# Patient Record
Sex: Female | Born: 1940
Health system: Southern US, Community
[De-identification: ages and names within clinical notes are randomized; demographics above are authoritative.]

## PROBLEM LIST (undated history)

## (undated) DIAGNOSIS — K589 Irritable bowel syndrome without diarrhea: Secondary | ICD-10-CM

## (undated) DIAGNOSIS — I701 Atherosclerosis of renal artery: Secondary | ICD-10-CM

## (undated) DIAGNOSIS — I774 Celiac artery compression syndrome: Secondary | ICD-10-CM

## (undated) DIAGNOSIS — K551 Chronic vascular disorders of intestine: Secondary | ICD-10-CM

## (undated) DIAGNOSIS — M1990 Unspecified osteoarthritis, unspecified site: Secondary | ICD-10-CM

## (undated) DIAGNOSIS — K759 Inflammatory liver disease, unspecified: Secondary | ICD-10-CM

## (undated) DIAGNOSIS — E119 Type 2 diabetes mellitus without complications: Secondary | ICD-10-CM

## (undated) DIAGNOSIS — Z923 Personal history of irradiation: Secondary | ICD-10-CM

## (undated) DIAGNOSIS — Q453 Other congenital malformations of pancreas and pancreatic duct: Secondary | ICD-10-CM

## (undated) DIAGNOSIS — K219 Gastro-esophageal reflux disease without esophagitis: Secondary | ICD-10-CM

## (undated) DIAGNOSIS — Z8711 Personal history of peptic ulcer disease: Secondary | ICD-10-CM

## (undated) DIAGNOSIS — I2510 Atherosclerotic heart disease of native coronary artery without angina pectoris: Secondary | ICD-10-CM

## (undated) DIAGNOSIS — I441 Atrioventricular block, second degree: Secondary | ICD-10-CM

## (undated) DIAGNOSIS — K5900 Constipation, unspecified: Secondary | ICD-10-CM

## (undated) DIAGNOSIS — I4719 Other supraventricular tachycardia: Secondary | ICD-10-CM

## (undated) DIAGNOSIS — Z87442 Personal history of urinary calculi: Secondary | ICD-10-CM

## (undated) DIAGNOSIS — K222 Esophageal obstruction: Secondary | ICD-10-CM

## (undated) DIAGNOSIS — K5732 Diverticulitis of large intestine without perforation or abscess without bleeding: Secondary | ICD-10-CM

## (undated) DIAGNOSIS — R160 Hepatomegaly, not elsewhere classified: Secondary | ICD-10-CM

## (undated) DIAGNOSIS — K449 Diaphragmatic hernia without obstruction or gangrene: Secondary | ICD-10-CM

## (undated) DIAGNOSIS — E785 Hyperlipidemia, unspecified: Secondary | ICD-10-CM

## (undated) DIAGNOSIS — I5030 Unspecified diastolic (congestive) heart failure: Secondary | ICD-10-CM

## (undated) DIAGNOSIS — I471 Supraventricular tachycardia: Secondary | ICD-10-CM

## (undated) DIAGNOSIS — R600 Localized edema: Secondary | ICD-10-CM

## (undated) DIAGNOSIS — T8859XA Other complications of anesthesia, initial encounter: Secondary | ICD-10-CM

## (undated) DIAGNOSIS — E041 Nontoxic single thyroid nodule: Secondary | ICD-10-CM

## (undated) DIAGNOSIS — K5790 Diverticulosis of intestine, part unspecified, without perforation or abscess without bleeding: Secondary | ICD-10-CM

## (undated) DIAGNOSIS — Z9861 Coronary angioplasty status: Secondary | ICD-10-CM

## (undated) DIAGNOSIS — D3500 Benign neoplasm of unspecified adrenal gland: Secondary | ICD-10-CM

## (undated) DIAGNOSIS — M549 Dorsalgia, unspecified: Secondary | ICD-10-CM

## (undated) DIAGNOSIS — K2990 Gastroduodenitis, unspecified, without bleeding: Secondary | ICD-10-CM

## (undated) DIAGNOSIS — T4145XA Adverse effect of unspecified anesthetic, initial encounter: Secondary | ICD-10-CM

## (undated) DIAGNOSIS — Z9289 Personal history of other medical treatment: Secondary | ICD-10-CM

## (undated) DIAGNOSIS — R001 Bradycardia, unspecified: Secondary | ICD-10-CM

## (undated) DIAGNOSIS — Z8719 Personal history of other diseases of the digestive system: Secondary | ICD-10-CM

## (undated) DIAGNOSIS — I214 Non-ST elevation (NSTEMI) myocardial infarction: Secondary | ICD-10-CM

## (undated) DIAGNOSIS — I10 Essential (primary) hypertension: Secondary | ICD-10-CM

## (undated) DIAGNOSIS — K2970 Gastritis, unspecified, without bleeding: Secondary | ICD-10-CM

## (undated) DIAGNOSIS — I4891 Unspecified atrial fibrillation: Secondary | ICD-10-CM

## (undated) DIAGNOSIS — I771 Stricture of artery: Secondary | ICD-10-CM

## (undated) HISTORY — DX: Atherosclerosis of renal artery: I70.1

## (undated) HISTORY — DX: Stricture of artery: I77.1

## (undated) HISTORY — DX: Hyperlipidemia, unspecified: E78.5

## (undated) HISTORY — DX: Unspecified diastolic (congestive) heart failure: I50.30

## (undated) HISTORY — DX: Bradycardia, unspecified: R00.1

## (undated) HISTORY — DX: Gastritis, unspecified, without bleeding: K29.70

## (undated) HISTORY — PX: APPENDECTOMY: SHX54

## (undated) HISTORY — DX: Non-ST elevation (NSTEMI) myocardial infarction: I21.4

## (undated) HISTORY — DX: Essential (primary) hypertension: I10

## (undated) HISTORY — DX: Gastroduodenitis, unspecified, without bleeding: K29.90

## (undated) HISTORY — DX: Nontoxic single thyroid nodule: E04.1

## (undated) HISTORY — PX: BREAST SURGERY: SHX581

## (undated) HISTORY — PX: BREAST DUCTAL SYSTEM EXCISION: SHX5242

## (undated) HISTORY — DX: Other congenital malformations of pancreas and pancreatic duct: Q45.3

## (undated) HISTORY — DX: Coronary angioplasty status: Z98.61

## (undated) HISTORY — DX: Dorsalgia, unspecified: M54.9

## (undated) HISTORY — DX: Unspecified atrial fibrillation: I48.91

## (undated) HISTORY — PX: PANCREAS SURGERY: SHX731

## (undated) HISTORY — DX: Hepatomegaly, not elsewhere classified: R16.0

## (undated) HISTORY — PX: HAMMER TOE SURGERY: SHX385

## (undated) HISTORY — PX: BACK SURGERY: SHX140

## (undated) HISTORY — PX: TOTAL ABDOMINAL HYSTERECTOMY: SHX209

## (undated) HISTORY — DX: Localized edema: R60.0

## (undated) HISTORY — DX: Irritable bowel syndrome, unspecified: K58.9

## (undated) HISTORY — DX: Constipation, unspecified: K59.00

## (undated) HISTORY — PX: SPINE SURGERY: SHX786

## (undated) HISTORY — DX: Supraventricular tachycardia: I47.1

## (undated) HISTORY — DX: Benign neoplasm of unspecified adrenal gland: D35.00

## (undated) HISTORY — DX: Diverticulosis of intestine, part unspecified, without perforation or abscess without bleeding: K57.90

## (undated) HISTORY — DX: Celiac artery compression syndrome: I77.4

## (undated) HISTORY — DX: Atrioventricular block, second degree: I44.1

## (undated) HISTORY — PX: CATARACT EXTRACTION W/ INTRAOCULAR LENS  IMPLANT, BILATERAL: SHX1307

## (undated) HISTORY — DX: Diaphragmatic hernia without obstruction or gangrene: K44.9

## (undated) HISTORY — DX: Unspecified osteoarthritis, unspecified site: M19.90

## (undated) HISTORY — PX: KNEE SURGERY: SHX244

## (undated) HISTORY — PX: TOTAL KNEE ARTHROPLASTY: SHX125

## (undated) HISTORY — PX: HIP SURGERY: SHX245

## (undated) HISTORY — PX: ESOPHAGOGASTRODUODENOSCOPY (EGD) WITH ESOPHAGEAL DILATION: SHX5812

## (undated) HISTORY — PX: JOINT REPLACEMENT: SHX530

## (undated) HISTORY — DX: Diverticulitis of large intestine without perforation or abscess without bleeding: K57.32

## (undated) HISTORY — DX: Esophageal obstruction: K22.2

## (undated) HISTORY — DX: Other supraventricular tachycardia: I47.19

## (undated) HISTORY — DX: Gastro-esophageal reflux disease without esophagitis: K21.9

## (undated) HISTORY — PX: CARDIOVASCULAR STRESS TEST: SHX262

## (undated) HISTORY — DX: Atherosclerotic heart disease of native coronary artery without angina pectoris: I25.10

## (undated) HISTORY — DX: Chronic vascular disorders of intestine: K55.1

## (undated) HISTORY — PX: FRACTURE SURGERY: SHX138

## (undated) HISTORY — PX: CORONARY ANGIOPLASTY WITH STENT PLACEMENT: SHX49

## (undated) HISTORY — PX: CHOLECYSTECTOMY OPEN: SUR202

## (undated) HISTORY — PX: PERCUTANEOUS PINNING PHALANX FRACTURE OF HAND: SUR1027

---

## 2001-07-14 ENCOUNTER — Other Ambulatory Visit: Admission: RE | Admit: 2001-07-14 | Discharge: 2001-07-14 | Payer: Self-pay | Admitting: *Deleted

## 2001-09-09 ENCOUNTER — Encounter (INDEPENDENT_AMBULATORY_CARE_PROVIDER_SITE_OTHER): Payer: Self-pay | Admitting: Specialist

## 2001-09-09 ENCOUNTER — Ambulatory Visit (HOSPITAL_COMMUNITY): Admission: RE | Admit: 2001-09-09 | Discharge: 2001-09-09 | Payer: Self-pay | Admitting: Gastroenterology

## 2002-07-13 ENCOUNTER — Other Ambulatory Visit: Admission: RE | Admit: 2002-07-13 | Discharge: 2002-07-13 | Payer: Self-pay | Admitting: *Deleted

## 2003-08-30 ENCOUNTER — Other Ambulatory Visit: Admission: RE | Admit: 2003-08-30 | Discharge: 2003-08-30 | Payer: Self-pay | Admitting: *Deleted

## 2004-11-08 ENCOUNTER — Other Ambulatory Visit: Admission: RE | Admit: 2004-11-08 | Discharge: 2004-11-08 | Payer: Self-pay | Admitting: *Deleted

## 2005-11-19 ENCOUNTER — Ambulatory Visit: Payer: Self-pay | Admitting: Gastroenterology

## 2005-11-20 ENCOUNTER — Ambulatory Visit: Payer: Self-pay | Admitting: Gastroenterology

## 2005-11-20 ENCOUNTER — Encounter (INDEPENDENT_AMBULATORY_CARE_PROVIDER_SITE_OTHER): Payer: Self-pay | Admitting: Specialist

## 2005-11-20 ENCOUNTER — Encounter (INDEPENDENT_AMBULATORY_CARE_PROVIDER_SITE_OTHER): Payer: Self-pay | Admitting: Family Medicine

## 2005-11-20 DIAGNOSIS — K2970 Gastritis, unspecified, without bleeding: Secondary | ICD-10-CM | POA: Insufficient documentation

## 2005-11-20 DIAGNOSIS — K2990 Gastroduodenitis, unspecified, without bleeding: Secondary | ICD-10-CM

## 2005-11-20 DIAGNOSIS — K449 Diaphragmatic hernia without obstruction or gangrene: Secondary | ICD-10-CM | POA: Insufficient documentation

## 2005-11-20 DIAGNOSIS — K5732 Diverticulitis of large intestine without perforation or abscess without bleeding: Secondary | ICD-10-CM | POA: Insufficient documentation

## 2005-11-20 DIAGNOSIS — K222 Esophageal obstruction: Secondary | ICD-10-CM | POA: Insufficient documentation

## 2005-12-10 ENCOUNTER — Other Ambulatory Visit: Admission: RE | Admit: 2005-12-10 | Discharge: 2005-12-10 | Payer: Self-pay | Admitting: *Deleted

## 2005-12-24 ENCOUNTER — Ambulatory Visit: Payer: Self-pay | Admitting: Gastroenterology

## 2005-12-31 ENCOUNTER — Ambulatory Visit: Payer: Self-pay | Admitting: Gastroenterology

## 2006-01-06 ENCOUNTER — Ambulatory Visit: Payer: Self-pay | Admitting: Gastroenterology

## 2006-01-14 ENCOUNTER — Ambulatory Visit: Payer: Self-pay | Admitting: Gastroenterology

## 2006-01-28 ENCOUNTER — Ambulatory Visit: Payer: Self-pay | Admitting: Gastroenterology

## 2006-03-27 ENCOUNTER — Ambulatory Visit: Payer: Self-pay | Admitting: Gastroenterology

## 2006-04-24 ENCOUNTER — Ambulatory Visit: Payer: Self-pay | Admitting: Gastroenterology

## 2006-05-06 ENCOUNTER — Encounter: Admission: RE | Admit: 2006-05-06 | Discharge: 2006-05-06 | Payer: Self-pay | Admitting: Gastroenterology

## 2006-07-03 ENCOUNTER — Ambulatory Visit: Payer: Self-pay | Admitting: Gastroenterology

## 2006-08-19 ENCOUNTER — Ambulatory Visit: Payer: Self-pay | Admitting: Gastroenterology

## 2006-08-21 ENCOUNTER — Encounter: Payer: Self-pay | Admitting: Gastroenterology

## 2006-10-20 ENCOUNTER — Ambulatory Visit: Payer: Self-pay | Admitting: Gastroenterology

## 2006-10-20 LAB — CONVERTED CEMR LAB
Basophils Absolute: 0.1 10*3/uL (ref 0.0–0.1)
MCHC: 35 g/dL (ref 30.0–36.0)
Monocytes Relative: 10.9 % (ref 3.0–11.0)
Platelets: 238 10*3/uL (ref 150–400)
RBC: 4.6 M/uL (ref 3.87–5.11)
RDW: 12.6 % (ref 11.5–14.6)
Sed Rate: 37 mm/hr — ABNORMAL HIGH (ref 0–25)

## 2006-12-04 ENCOUNTER — Ambulatory Visit: Payer: Self-pay | Admitting: Gastroenterology

## 2006-12-05 ENCOUNTER — Ambulatory Visit: Payer: Self-pay | Admitting: Gastroenterology

## 2007-01-13 ENCOUNTER — Other Ambulatory Visit: Admission: RE | Admit: 2007-01-13 | Discharge: 2007-01-13 | Payer: Self-pay | Admitting: *Deleted

## 2007-05-13 ENCOUNTER — Encounter: Admission: RE | Admit: 2007-05-13 | Discharge: 2007-05-13 | Payer: Self-pay | Admitting: General Surgery

## 2007-06-09 ENCOUNTER — Ambulatory Visit: Payer: Self-pay | Admitting: Gastroenterology

## 2007-06-09 LAB — CONVERTED CEMR LAB
ALT: 28 units/L (ref 0–35)
AST: 21 units/L (ref 0–37)
Alkaline Phosphatase: 84 units/L (ref 39–117)
BUN: 7 mg/dL (ref 6–23)
Basophils Relative: 0.9 % (ref 0.0–1.0)
Bilirubin, Direct: 0.1 mg/dL (ref 0.0–0.3)
CO2: 32 meq/L (ref 19–32)
Calcium: 9 mg/dL (ref 8.4–10.5)
Chloride: 102 meq/L (ref 96–112)
Ferritin: 55.1 ng/mL (ref 10.0–291.0)
GFR calc non Af Amer: 89 mL/min
Glucose, Bld: 116 mg/dL — ABNORMAL HIGH (ref 70–99)
HCT: 37.5 % (ref 36.0–46.0)
Hemoglobin: 13 g/dL (ref 12.0–15.0)
Monocytes Absolute: 0.6 10*3/uL (ref 0.2–0.7)
Neutrophils Relative %: 63.6 % (ref 43.0–77.0)
RBC: 4.46 M/uL (ref 3.87–5.11)
RDW: 12.7 % (ref 11.5–14.6)
Sed Rate: 33 mm/hr — ABNORMAL HIGH (ref 0–25)
TSH: 3 microintl units/mL (ref 0.35–5.50)
Vitamin B-12: 424 pg/mL (ref 211–911)

## 2007-06-12 ENCOUNTER — Encounter: Payer: Self-pay | Admitting: Gastroenterology

## 2007-06-16 ENCOUNTER — Encounter: Payer: Self-pay | Admitting: Gastroenterology

## 2007-06-18 ENCOUNTER — Encounter: Admission: RE | Admit: 2007-06-18 | Discharge: 2007-06-18 | Payer: Self-pay | Admitting: General Surgery

## 2007-06-18 ENCOUNTER — Encounter: Payer: Self-pay | Admitting: Gastroenterology

## 2007-06-22 ENCOUNTER — Ambulatory Visit (HOSPITAL_BASED_OUTPATIENT_CLINIC_OR_DEPARTMENT_OTHER): Admission: RE | Admit: 2007-06-22 | Discharge: 2007-06-22 | Payer: Self-pay | Admitting: General Surgery

## 2007-06-22 ENCOUNTER — Encounter: Admission: RE | Admit: 2007-06-22 | Discharge: 2007-06-22 | Payer: Self-pay | Admitting: General Surgery

## 2007-06-22 ENCOUNTER — Encounter (INDEPENDENT_AMBULATORY_CARE_PROVIDER_SITE_OTHER): Payer: Self-pay | Admitting: General Surgery

## 2007-09-01 ENCOUNTER — Ambulatory Visit: Payer: Self-pay | Admitting: Gastroenterology

## 2007-09-01 LAB — CONVERTED CEMR LAB: Hgb A1c MFr Bld: 6 % (ref 4.6–6.0)

## 2007-11-12 DIAGNOSIS — I499 Cardiac arrhythmia, unspecified: Secondary | ICD-10-CM | POA: Insufficient documentation

## 2007-11-12 DIAGNOSIS — N200 Calculus of kidney: Secondary | ICD-10-CM | POA: Insufficient documentation

## 2007-11-12 DIAGNOSIS — E039 Hypothyroidism, unspecified: Secondary | ICD-10-CM | POA: Insufficient documentation

## 2007-11-12 DIAGNOSIS — Q453 Other congenital malformations of pancreas and pancreatic duct: Secondary | ICD-10-CM | POA: Insufficient documentation

## 2007-11-12 DIAGNOSIS — K589 Irritable bowel syndrome without diarrhea: Secondary | ICD-10-CM | POA: Insufficient documentation

## 2007-11-24 ENCOUNTER — Ambulatory Visit: Payer: Self-pay | Admitting: Gastroenterology

## 2007-11-24 LAB — CONVERTED CEMR LAB: BUN: 11 mg/dL (ref 6–23)

## 2007-12-03 ENCOUNTER — Ambulatory Visit (HOSPITAL_COMMUNITY): Admission: RE | Admit: 2007-12-03 | Discharge: 2007-12-03 | Payer: Self-pay | Admitting: Gastroenterology

## 2007-12-21 ENCOUNTER — Ambulatory Visit: Payer: Self-pay

## 2008-02-09 ENCOUNTER — Telehealth: Payer: Self-pay | Admitting: Gastroenterology

## 2008-02-12 ENCOUNTER — Encounter: Payer: Self-pay | Admitting: Gastroenterology

## 2008-03-15 ENCOUNTER — Inpatient Hospital Stay (HOSPITAL_COMMUNITY): Admission: RE | Admit: 2008-03-15 | Discharge: 2008-03-20 | Payer: Self-pay | Admitting: Orthopedic Surgery

## 2008-06-30 ENCOUNTER — Ambulatory Visit (HOSPITAL_BASED_OUTPATIENT_CLINIC_OR_DEPARTMENT_OTHER): Admission: RE | Admit: 2008-06-30 | Discharge: 2008-06-30 | Payer: Self-pay | Admitting: Orthopedic Surgery

## 2009-01-03 ENCOUNTER — Ambulatory Visit: Payer: Self-pay | Admitting: Gastroenterology

## 2009-01-24 ENCOUNTER — Ambulatory Visit: Payer: Self-pay | Admitting: Gastroenterology

## 2009-01-24 DIAGNOSIS — R197 Diarrhea, unspecified: Secondary | ICD-10-CM

## 2009-01-24 DIAGNOSIS — K219 Gastro-esophageal reflux disease without esophagitis: Secondary | ICD-10-CM | POA: Insufficient documentation

## 2009-01-24 DIAGNOSIS — R1319 Other dysphagia: Secondary | ICD-10-CM | POA: Insufficient documentation

## 2009-01-24 DIAGNOSIS — K909 Intestinal malabsorption, unspecified: Secondary | ICD-10-CM | POA: Insufficient documentation

## 2009-01-24 LAB — CONVERTED CEMR LAB
Albumin: 3.4 g/dL — ABNORMAL LOW (ref 3.5–5.2)
Alkaline Phosphatase: 94 units/L (ref 39–117)
BUN: 8 mg/dL (ref 6–23)
Basophils Relative: 0.1 % (ref 0.0–3.0)
CO2: 32 meq/L (ref 19–32)
Calcium: 9 mg/dL (ref 8.4–10.5)
Creatinine, Ser: 0.7 mg/dL (ref 0.4–1.2)
Eosinophils Relative: 1.7 % (ref 0.0–5.0)
Folate: 17.6 ng/mL
Glucose, Bld: 99 mg/dL (ref 70–99)
Lymphs Abs: 1.6 10*3/uL (ref 0.7–4.0)
MCHC: 34.6 g/dL (ref 30.0–36.0)
MCV: 84.1 fL (ref 78.0–100.0)
Monocytes Relative: 9.1 % (ref 3.0–12.0)
Neutro Abs: 4.9 10*3/uL (ref 1.4–7.7)
RBC: 4.42 M/uL (ref 3.87–5.11)
RDW: 12.9 % (ref 11.5–14.6)
Saturation Ratios: 9.2 % — ABNORMAL LOW (ref 20.0–50.0)
Total Protein: 7.1 g/dL (ref 6.0–8.3)
Vitamin B-12: 441 pg/mL (ref 211–911)

## 2009-01-27 ENCOUNTER — Encounter: Payer: Self-pay | Admitting: Gastroenterology

## 2009-02-03 LAB — CONVERTED CEMR LAB
Epinephrine 24 Hr Urine: 6 mcg/24hr (ref <20)
Norepinephrine 24 Hr Urine: 84 mcg/24hr — ABNORMAL HIGH (ref <80)
Normetanephrine, 24H Ur: 584 (ref 122–676)

## 2009-03-01 ENCOUNTER — Encounter: Payer: Self-pay | Admitting: Gastroenterology

## 2009-03-01 ENCOUNTER — Ambulatory Visit: Payer: Self-pay | Admitting: Gastroenterology

## 2009-03-01 HISTORY — PX: COLONOSCOPY: SHX174

## 2009-03-03 ENCOUNTER — Encounter: Payer: Self-pay | Admitting: Gastroenterology

## 2009-09-29 ENCOUNTER — Telehealth: Payer: Self-pay | Admitting: Gastroenterology

## 2009-10-03 ENCOUNTER — Ambulatory Visit: Payer: Self-pay | Admitting: Gastroenterology

## 2010-04-25 ENCOUNTER — Encounter: Admission: RE | Admit: 2010-04-25 | Discharge: 2010-04-25 | Payer: Self-pay | Admitting: General Surgery

## 2010-07-23 ENCOUNTER — Encounter: Admission: RE | Admit: 2010-07-23 | Discharge: 2010-07-23 | Payer: Self-pay | Admitting: Cardiology

## 2010-07-30 ENCOUNTER — Inpatient Hospital Stay (HOSPITAL_COMMUNITY): Admission: RE | Admit: 2010-07-30 | Discharge: 2010-07-31 | Payer: Self-pay | Admitting: Cardiology

## 2010-07-30 DIAGNOSIS — I2510 Atherosclerotic heart disease of native coronary artery without angina pectoris: Secondary | ICD-10-CM | POA: Insufficient documentation

## 2010-07-30 HISTORY — PX: CORONARY ANGIOPLASTY WITH STENT PLACEMENT: SHX49

## 2010-07-30 HISTORY — PX: LEFT HEART CATH AND CORONARY ANGIOGRAPHY: CATH118249

## 2010-08-14 DIAGNOSIS — I701 Atherosclerosis of renal artery: Secondary | ICD-10-CM

## 2010-08-14 HISTORY — DX: Atherosclerosis of renal artery: I70.1

## 2010-09-10 ENCOUNTER — Telehealth: Payer: Self-pay | Admitting: Gastroenterology

## 2010-10-04 NOTE — Progress Notes (Signed)
Summary: Triage   Phone Note Call from Patient Call back at Home Phone [phone]   Caller: Patient Call For: Dr. Jarold Motto Reason for Call: Talk to Nurse Summary of Call: Insurance will not pay for Ultrase and needs something else called in  Initial call taken by: Karna Christmas,  September 29, 2009 12:31 PM  Follow-up for Phone Call        whatever is on her list is ok Follow-up by: Mardella Layman MD [name],  September 29, 2009 1:39 PM  Additional Follow-up for Phone Call Additional follow up Details #1::        talked with pt.  She has new formulary.  Pt req OV, having some abd pain.  Appt sch.  PT to bring formulary with her. Additional Follow-up by: Ashok Cordia RN,  September 29, 2009 2:04 PM

## 2010-10-04 NOTE — Assessment & Plan Note (Signed)
Summary: Abd pain, needs med change/dfs    History of Present Illness Visit Type: Follow-up Visit Primary GI MD: Sheryn Bison MD FACP FAGA Primary Provider: Dr Luiz Iron Chief Complaint: Patient c/o 1 month intermittent RUQ abdominal pain which is sharp. She denies any nausea or vomiting. No fever. She denies any loss of appetite. She has had diarrhea but states that this is normal for her. History of Present Illness:   Leighla continues with vague right upper quadrant discomfort, gas and bloating. I reviewed all of her records today personally and with her including CT scan this past sprihg. She has been fatty infiltration the liver and suspected chronic pancreatitis from pancreas divisum and has had previous pancreatic stenting but recent CT scan showed no evidence of a retained stent. She denies any specific hepatobiliary complaints at this time. She has chronic irritable bowel syndrome with chronic gas and bloating and bowel irregularity. Recently her Ultrase pancreatic enzyme supplements were removed from the market place. She personally feels these medications are greatly alleviated her chronic gastrointestinal difficulties.  She continues to eat well and has chronic obesity. Liver function tests have been repeatedly normal. She denies acid reflux or dysphagia . She has a family history colon cancer, but is up-to-date on her colonoscopy.    GI Review of Systems    Reports abdominal pain and  bloating.     Location of  Abdominal pain: RUQ.    Denies acid reflux, belching, chest pain, dysphagia with liquids, dysphagia with solids, heartburn, loss of appetite, nausea, vomiting, vomiting blood, weight loss, and  weight gain.      Reports diarrhea and  rectal bleeding.     Denies anal fissure, black tarry stools, change in bowel habit, constipation, diverticulosis, fecal incontinence, heme positive stool, hemorrhoids, irritable bowel syndrome, jaundice, light color stool, liver problems, and   rectal pain.    Current Medications (verified): 1)  Monoject Safety Syringe/shield 22g X 1" 3 Ml Misc (Syringe/needle (Disp)) .... Use 1 Syringe As Directed 2)  Furosemide 20 Mg  Tabs (Furosemide) .... Take One Tab  By Mouth Once Daily 3)  Atenolol 100 Mg Tabs (Atenolol) .... One Tablet By Mouth Once Daily 4)  Ultrase Mt 20 65-20-65 Mu  Cpep (Amylase-Lipase-Protease) .... Take 1 With Meals Three Times A Day 5)  Nexium 40 Mg  Cpdr (Esomeprazole Magnesium) .Marland Kitchen.. 1 Capsule Each Day 30 Minutes Before Meal 6)  Cyanocobalamin 1000 Mcg/ml Soln (Cyanocobalamin) .... Inject Im Once Per Month 7)  Furosemide 40 Mg Tabs (Furosemide) .... One Tablet By Mouth Once Daily 8)  Vitamin D 1000 Unit Tabs (Cholecalciferol) .... Take 1 Tablet By Mouth Once Daily  Allergies (verified): 1)  Codeine Phosphate (Codeine Phosphate) 2)  Iodine (Iodine)  Past History:  Past medical, surgical, family and social histories (including risk factors) reviewed for relevance to current acute and chronic problems.  Past Medical History: Reviewed history from 11/12/2007 and no changes required. Current Problems:  PANCREAS DIVISUM (ICD-751.7) IRRITABLE BOWEL SYNDROME (ICD-564.1) RENAL CALCULUS (ICD-592.0) CARDIAC ARRHYTHMIA (ICD-427.9) THYROID DISORDER (ICD-246.9) GASTRITIS (ICD-535.50) HIATAL HERNIA (ICD-553.3) ESOPHAGEAL STRICTURE (ICD-530.3) DIVERTICULITIS, COLON (ICD-562.11)  Past Surgical History: Cholecystectomy minor papilla stent - Dr. Woodroe Chen knee surgery-left foot surgery-right hip surgery-left breast duct surgery-right hysterectomy Hand surgery-left  Family History: Reviewed history from 01/24/2009 and no changes required. cancer-metastatic-father MI-father/brothers x 2 CVA-mother No FH of Colon Cancer: Family History of Colitis/Crohn's: Grandfather (colitis) Family History of Diabetes: Brother  Social History: Reviewed history from 01/24/2009 and no changes required.  Occupation: owner  gift shop Patient has never smoked.  Alcohol Use - no Daily Caffeine Use-3-4 glasses a day Illicit Drug Use - no Patient does not get regular exercise.   Review of Systems       The patient complains of arthritis/joint pain, back pain, fatigue, muscle pains/cramps, and shortness of breath.    Vital Signs:  Patient profile:   70 year old female Height:      68 inches Weight:      244 pounds BMI:     37.23 BSA:     2.23 Pulse rate:   80 / minute Pulse rhythm:   regular BP sitting:   134 / 68  (right arm)  Vitals Entered By: Hortense Ramal CMA Duncan Dull) (October 03, 2009 2:15 PM)  Physical Exam  General:  Well developed, well nourished, no acute distress.healthy appearing and obese.   Head:  Normocephalic and atraumatic. Eyes:  PERRLA, no icterus.exam deferred to patient's ophthalmologist.   Lungs:  Clear throughout to auscultation. Heart:  Regular rate and rhythm; no murmurs, rubs,  or bruits. Abdomen:  Soft, nontender and nondistended. No masses, hepatosplenomegaly or hernias noted. Normal bowel sounds. Msk:  Symmetrical with no gross deformities. Normal posture. Pulses:  Normal pulses noted. Extremities:  No clubbing, cyanosis, edema or deformities noted. Neurologic:  Alert and  oriented x4;  grossly normal neurologically. Psych:  Alert and cooperative. Normal mood and affect.   Impression & Recommendations:  Problem # 1:  GERD (ICD-530.81) Assessment Improved Continue reflux regime and daily Nexium therapy.  Problem # 2:  PANCREAS DIVISUM (ICD-751.7) Assessment: Improved change to Zen-pap pancreatic extract supplementation.  Problem # 3:  IRRITABLE BOWEL SYNDROME (ICD-564.1) Assessment: Improved Periodic treatment for suspected bacterial overgrowth syndrome as needed  Patient Instructions: 1)  Begin Zenpap 20 three times a day.  A prescription will be sent to your pharmacy.  Please reoprt to [place] how this works for you. 2)  The medication list was reviewed and  reconciled.  All changed / newly prescribed medications were explained.  A complete medication list was provided to the patient / caregiver. 3)  Please continue current medications.  4)  Please schedule a follow-up appointment as needed.  Prescriptions: ZENPEP 20000 UNIT CPEP (PANCRELIPASE (LIP-PROT-AMYL)) 1 by mouth tid  #90 x 6   Entered by:   Ashok Cordia RN   Authorized by:   Mardella Layman MD [facility]   Signed by:   Ashok Cordia RN on 10/03/2009   Method used:   Electronically to        [facility]  [address] [phone][facility] (retail)       [address]       [place], [place]  [address]       Ph: [phone] or [phone]       Fax: [phone]   RxID:   [id number]

## 2010-10-04 NOTE — Progress Notes (Signed)
Summary: Medication Questions  Medications Added NEXIUM 40 MG  CPDR (ESOMEPRAZOLE MAGNESIUM) 1 capsule each day 30 minutes before meal       Phone Note Call from Patient Call back at Home Phone [phone]   Caller: Patient Call For: Dr. Jarold Motto Reason for Call: Talk to Nurse Summary of Call: pt wants to discuss her nexium with a nurse Initial call taken by: [place] Johnson,  September 10, 2010 3:35 PM  Follow-up for Phone Call        Patient called to report she had 2 stents placed in December and she's been taking Prilosec d/t Medicare donut hole. Patient stated her husband had an MII and Dr Jarold Motto instructed him never to take Prilosec again-take Nexium; she stated Dr Jarold Motto gave him a paper explaining this. Patient stated she's been using OTC Gaviscon and other meds with no help, should she not be on Prilosec either? Graciella Freer RN  September 10, 2010 4:14 PM   Additional Follow-up for Phone Call Additional follow up Details #1::        SHE IS RIGHT...USE NEXIUM,NOT PRILOSEC IF ON PLAVIX... Additional Follow-up by: Mardella Layman MD Elba Barman 4:47 PM    Additional Follow-up for Phone Call Additional follow up Details #2::    Notified patient that Dr Jarold Motto stated if a patient is on Plavix, they cannot take Prilosec; he ok'd an order for Nexium. Patient stated understanding and med was ordered. Follow-up by: Graciella Freer RN,  September 10, 2010 4:56 PM  New/Updated Medications: NEXIUM 40 MG  CPDR (ESOMEPRAZOLE MAGNESIUM) 1 capsule each day 30 minutes before meal Prescriptions: NEXIUM 40 MG  CPDR (ESOMEPRAZOLE MAGNESIUM) 1 capsule each day 30 minutes before meal  #30 x 3   Entered by:   Graciella Freer RN   Authorized by:   Mardella Layman MD [facility]   Signed by:   Graciella Freer RN on 09/10/2010   Method used:   [facility]        [facility]  [address] [phone]* (retail)       [address]       [place], [place]   [address]       Ph: [phone] or [phone]       Fax: [phone]   RxID:   [id number]

## 2010-10-24 ENCOUNTER — Other Ambulatory Visit (HOSPITAL_COMMUNITY): Payer: Self-pay | Admitting: Cardiology

## 2010-10-26 ENCOUNTER — Ambulatory Visit (HOSPITAL_COMMUNITY)
Admission: RE | Admit: 2010-10-26 | Discharge: 2010-10-26 | Disposition: A | Discharge status: Home / Self Care | Payer: Medicare Other | Source: Ambulatory Visit | Attending: Cardiology | Admitting: Cardiology

## 2010-10-26 DIAGNOSIS — R109 Unspecified abdominal pain: Secondary | ICD-10-CM | POA: Insufficient documentation

## 2010-10-26 DIAGNOSIS — K7689 Other specified diseases of liver: Secondary | ICD-10-CM | POA: Insufficient documentation

## 2010-11-13 LAB — CBC
Hemoglobin: 11.5 g/dL — ABNORMAL LOW (ref 12.0–15.0)
MCH: 27.6 pg (ref 26.0–34.0)
MCV: 84.9 fL (ref 78.0–100.0)
RBC: 4.16 MIL/uL (ref 3.87–5.11)

## 2010-11-13 LAB — BASIC METABOLIC PANEL
CO2: 24 mEq/L (ref 19–32)
Chloride: 102 mEq/L (ref 96–112)
GFR calc Af Amer: >60 mL/min (ref >60)
Potassium: 4.5 mEq/L (ref 3.5–5.1)
Sodium: 136 mEq/L (ref 135–145)

## 2010-11-13 LAB — POCT I-STAT 3, ART BLOOD GAS (G3+)
Acid-Base Excess: 2 mmol/L (ref 0.0–2.0)
Bicarbonate: 28 mEq/L — ABNORMAL HIGH (ref 20.0–24.0)
O2 Saturation: 91 %
TCO2: 29 mmol/L (ref 0–100)
pO2, Arterial: 66 mmHg — ABNORMAL LOW (ref 80.0–100.0)

## 2010-11-13 LAB — POCT I-STAT 3, VENOUS BLOOD GAS (G3P V)
Bicarbonate: 28.7 mEq/L — ABNORMAL HIGH (ref 20.0–24.0)
TCO2: 30 mmol/L (ref 0–100)
pH, Ven: 7.335 — ABNORMAL HIGH (ref 7.250–7.300)

## 2010-11-13 LAB — GLUCOSE, CAPILLARY: Glucose-Capillary: 168 mg/dL — ABNORMAL HIGH (ref 70–99)

## 2010-11-27 ENCOUNTER — Other Ambulatory Visit: Payer: Self-pay | Admitting: Gastroenterology

## 2011-01-15 NOTE — Assessment & Plan Note (Signed)
[facility]                         GASTROENTEROLOGY OFFICE NOTE   YAZLYNN, BIRKELAND                       MRN:          [id number]  DATE:09/01/2007                            DOB:          1941-06-15    Haelee has had breast surgery since she was last seen and really has not  tried her Lotronex at a lower dose.  She continues with diarrhea and  predominant IBS symptoms.  It is of note that her IBS serology pattern  done at  [facility] in October of 2008 was felt to be not consistent  with IBS.  C19-9 pancreatic tumor marker was normal, as was CBC and  metabolic profile.  She did have a slightly elevated sedimentation rate  of 33, but otherwise her blood work was fairly normal.  Her albumin was  slightly depressed at 3.3 gm%.   Review of Kymberlee's chart shows that she had endoscopy in March of 2007,  and last colonoscopy was in January of 2003.  At that time, she had  multiple colon biopsies without evidence of microscopic-collagenous  colitis.  Ultrasound of the upper abdomen was performed in April of  2008.  It was unremarkable except for previous cholecystectomy.  The  patient relates that she had a previous minor papilla stent placed at  [facility] by Dr. Oren Binet.  There has been no evidence of  stent retention or other complications from such.  She does have chronic  pancreatic insufficiency and known pancreas divisum and takes Ultrase MT  20 one t.i.d.   OTHER MEDICATIONS:  1. Nexium 40 mg daily.  2. Triamterene/HCTZ 50/25 mg daily for edema.  3. Lasix 20 mg three times a week.  4. Atenolol 50 mg a day.  5. B-12 replacement shots.  6. Ultrase.   PHYSICAL EXAMINATION:  GENERAL:  She is a healthy-appearing white female  in no distress, appearing her stated age.  VITAL SIGNS:  I do not have a weight in her chart today.  Blood pressure  122/72, pulse 80 and regular.  (General physical exam was not repeated at this time.)   ASSESSMENT:  I still think that Thirza's main problem is diarrhea  predominant irritable bowel syndrome and rapid intestinal transit.  All  attempts in the past at treatment of her chronic diarrhea have been  unsuccessful.  She had a previous very good response previously to  Lotronex years ago on reviewing her chart.  Previous small bowel biopsy  has not shown any evidence of celiac disease.   RECOMMENDATIONS:  I have asked her to try Lotronex 0.5 mg twice a day or  once a day as needed for her diarrhea-like syndrome.  She will continue  her empiric Ultrase MT 20 tablets t.i.d.  Will see her back in 3 months time on a p.r.n. basis as needed.  Consideration needs to be given to repeat colonoscopy which has not been  done in several years.     Vania Rea. Jarold Motto, MD, Caleen Essex, FAGA  Electronically Signed    DRP/MedQ  DD: 09/01/2007  DT: 09/01/2007  Job #: [id number]

## 2011-01-15 NOTE — Op Note (Signed)
[name], [name]                ACCOUNT NO.:  [id number]   MEDICAL RECORD NO.:  [id number]          PATIENT TYPE:  AMB   LOCATION:  [facility]                         FACILITY:  [facility]   PHYSICIAN:  Deidre Ala, M.D.    DATE OF BIRTH:  [birth year]   DATE OF PROCEDURE:  06/30/2008  DATE OF DISCHARGE:                               OPERATIVE REPORT   PREOPERATIVE DIAGNOSIS:  1. Left hip greater trochanteric prominence with bursitis and tight      iliotibial band.  2. Right second hammertoe at proximal interphalangeal joint.  3. Right great toenail deformity - onychomycosis.   POSTOPERATIVE DIAGNOSIS:  1. Left hip greater trochanteric prominence with bursitis and tight      iliotibial band.  2. Right second hammertoe at proximal interphalangeal joint.  3. Right great toenail deformity - onychomycosis.   PROCEDURE:  1. Left greater trochanteric partial ostectomy with bursectomy and      iliotibial band release.  2. Correction of right second hammertoe at PIP joint DuVries      procedure.  3. Removal left great toenail plate only.   SURGEON:  Doristine Section, MD.   ASSISTANT:  Phineas Semen, PA-C.   ANESTHESIA:  General with LMA.   CULTURES:  None.   DRAINS:  None.   ESTIMATED BLOOD LOSS:  Minimal.   TOURNIQUET TIME:  Right ankle 26 minutes, Esmarch.   PATHOLOGIC FINDINGS/HISTORY:  [name] is a 70 year old female who  underwent recent successful left total knee arthroplasty.  Postoperatively, she has gotten hip area pain and she has been worked up  for any significant radiculopathy in the low back.  She had cortisone  injections in the greater trochanter that improved her situation.  She  has pain radiating from the trochanter down to the lateral leg.  The  knee itself is doing well except for some mild stiffness.  With the  diagnosis of trochanteric bursitis, she elected to proceed with  iliotibial band release, partial ostectomy.  There was edema on her  greater  trochanter suggesting some trochanteric bursitis.  There was  nothing going on in the hip joint itself.  In addition, she had right  great toe onychomycosis but did not desire total ablation of the matrix,  just wanted the plate removed, and the right second toe was hammered  chronically at the PIP joint so we corrected it with a DuVries  procedure.  Her lumbar MRI scan suggested some lower lumbar degenerative  disk disease but Dr. Regino Schultze did not feel she had significant radiculitis  and did not do cortisone at this point, especially because she has  severe reactions thereof.  She did get a Toradol and Marcaine injection  to left hip greater trochanter which helped but it did not last a long  time.  At surgery she had a very tight iliotibial band with a thick  greater trochanteric bursa.  This released nicely with a classic  cruciate technique.  There was evidence of enthesopathy with tendon  insertion as well as a craggy sharp greater trochanter, all of this was  smoothed and  released and left released with respect to the iliotibial  band.  We corrected the right second toe to physiologic parameters, not  completely straight but physiologic, at the PIP joint using the DuVries  technique, which will be described, and removed her right great toenail  plate only.   PROCEDURE:  With adequate anesthesia obtained using endotracheal  technique, 1 gram Ancef given IV prophylaxis.  The patient was placed in  the right lateral decubitus position with the left side up with a  beanbag.  After standard prepping and draping, a straight incision was  made about 5-6 cm over the greater trochanter.  Incision was deepened  sharply with a knife and hemostasis obtained using the Bovie  electrocoagulator.  Dissection was carried down to the iliotibial band.  This was released longitudinally about 4 cm and anterior posterior about  3 cm in a cruciate fashion directly over the bursa.  I then sharply  excised  the bursa with the Bovie.  I then removed the spur on the  lateral aspect to cortical bone to promote neovascularity on the greater  trochanter smoothing it.  Irrigation was carried out, that wound was  closed in layers with #2-0 and #3-0 Vicryl in the subcu and skin  staples.  Marcaine 0.5% was injected in and about the wound 30 mL.  We  then placed a dressing, turned the patient supine and prepped and draped  the right foot.  Esmarch exsanguination was used and the Esmarch was  left on above the ankle.  I then used a Glorious Peach to remove the great  toenail plate.  I then proceeded with a right second toe DuVries by  taking a wedge of skin dorsally over the PIP joint, a wedge of the  extensor tendon, then I took a wedge osteotomy removing the PIP joint  with oscillating saw and bone cutting rongeur at the PIP joint, removing  the articular surface.  I then released the flexor tendon with a Beaver  blade.  I then brought the toe up to a slightly curved physiologic  position and sutured it using greasy gauze Xeroflo bolsters 1/2-inch  with vertical mattress suture of #4-0 nylon and then #4-0 nylon sutures  on either side.  A bulky sterile compressive forefoot dressing was  applied with Adaptic on the great toe.  The tourniquet was let down.  The patient, having tolerated procedure well, was awakened and taken to  the recovery room in satisfactory condition.  He will be discharged per  outpatient routine given Percocet for pain, wooden sole shoe on the  right, crutches, weightbearing as tolerated, told to call the office for  recheck on Monday.      Deidre Ala, M.D.  Electronically Signed     VEP/MEDQ  D:  06/30/2008  T:  06/30/2008  Job:  [id number]   cc:   Deidre Ala, M.D.  Fax: [phone]   Dennis Bast, MD  [place], [place]

## 2011-01-15 NOTE — Op Note (Signed)
[name], [name]                ACCOUNT NO.:  [id number]   MEDICAL RECORD NO.:  [id number]          PATIENT TYPE:  INP   LOCATION:  0002                         FACILITY:  [facility]   PHYSICIAN:  Deidre Ala, M.D.    DATE OF BIRTH:  02-Oct-1940   DATE OF PROCEDURE:  03/15/2008  DATE OF DISCHARGE:                               OPERATIVE REPORT   PREOPERATIVE DIAGNOSIS:  End-stage degenerative joint disease left knee,  with high body mass index.   POSTOPERATIVE DIAGNOSIS:  End-stage degenerative joint disease left  knee, with high body mass index.   PROCEDURE:  Left total knee arthroplasty using cemented DePuy components  LCS type, with rotating platform and DT stem.   SURGEON:  1. Charlesetta Shanks, M.D.   ASSISTANT:  Phineas Semen, P.A.-C.   ANESTHESIA:  General, with femoral nerve block.   CULTURES:  None.   DRAINS:  Two medium Hemovacs to self-suction.   ESTIMATED BLOOD LOSS:  50 mL.   REPLACEMENT:  Without.   TOURNIQUET TIME:  1 hour 11 minutes.   PATHOLOGIC FINDINGS AND HISTORY:  [name] has had a longstanding history  of degenerative joint disease reaching end stage left with slight varus.  She has high body mass index issues.  At surgery, classic tricompartment  disease was noted.  She was fitted to a standard left femoral component,  #3 MBT revision cemented tray, universal stem fluted 75 x 16, and a 10  mm rotating platform, and a 32 mm all-poly patellar button.  We used two  batches of tobramycin in two batches of cement, 1.3 g, and we had full  extension, flexion to about 100 degrees, stable to varus-valgus  stressing, good ligamentous stability, and we had to do a lateral  retinacular release because the patellofemoral articulation was tight  laterally.  This allowed physiologic tilt and track.  She also had  gentamicin as per protocol injected into the knee at closure, 80 mg in  40 mL injected into the drain, with rubber shod capping of the drain for  2 hours  until its charged in the PACU.   PROCEDURE:  With adequate anesthesia obtained using LMA technique and  femoral nerve block, the patient was placed in the supine position.  The  left lower extremity was prepped from the toes to the tourniquet in the  standard fashion.  After standard prepping and draping, Esmarch  exsanguination was used.  The tourniquet was let up to 400 mmHg.  A  median parapatellar skin incision was made, followed by a median  parapatellar retinacular incision.  The incision was deepened sharply  with a knife and hemostasis obtained using the Bovie electrocoagulater.  We then incised the patellar retinaculum, everted the patella, and  excised the fat pad, both menisci, and the cruciates.  I then amputated  the tibial spine flat and proceeded to drill the tibial intramedullary  canal in slight varus to effect leg valgus for the stem, and reamed up  to a 16.  I then cut with a 2-degree cutting block on 4 mm the tibial  cut and freshened  it.  I then sized the femur to a standard, placed it  in place, and set it with a C clamp.  It was still a bit tight for the  10, so I moved it up 2.5 mm, made the anterior and posterior cuts, and  fit the 10 lollipop in flexion.  I then placed a 4-degree valgus distal  femoral cutting jig in place, made that cut.  It was too tight for the  10. I made the first cut 2.5 mm back, and the second one another 2.5,  and then fit the 10 mm in full extension.  I then placed a finishing  guide on the distal femur with  anterior, posterior, chamfer cuts, and  notch cut.  I then exposed the tibia and sized to a 3, and then reamed  with the proximal conical ream, and then reamed with the stem for the  16.  I then placed a trial tibial component with the stem in place and  impacted it with the keel punch and set the rotation.  I then placed a  10 mm insert in, articulated, and set the femoral component in, and  articulated the knee through a range of  motion.  The lateral retinacular  release was then carried out, the patella everted, sized to a 32.  Caliper measurement was thickness of 21, cut it down to a 13-14,  replacing about 8, placed the 3 peg drill holes in place, and made those  drill holes and placed the patella button in place, and articulated the  knee through a range of motion.  The trial components were then removed.  The knee was thoroughly jet lavaged at this point.  We then sized the  components as there were coming on the field, and mixed cement with the  tobramycin in the cement gun.  We then cemented on the tibial component,  impacted it, and removed excess cement.  We then placed the 10 mm  rotating platform.  We cemented on the femoral component, impacted it,  removed excess cement, held the knee in full extension, and removed  excess cement.  We then cemented on the patellar component, impacted it,  removed excess cement, and held it with a clamp until the cement cured.  The knee was then again articulated through a range of motion.  FloSeal  was then placed in the wound as the tourniquet was let down.  Bleeding  points were cauterized.  Hemovac drains were placed in the medial and  lateral gutter and brought out the superolateral portal.  The wound was  then closed in layers with #1 Vicryl figure-of-eight on the retinaculum,  0 and 2-0 Vicryl on the subcutaneous, and skin staples.  We charged the  knee with the gentamicin mixture with the saline, and then clamped it.  We then placed a bulky sterile compressive dressing with a knee  immobilizer.  The patient, having tolerated the procedure well, was  awakened and taken to the recovery room in satisfactory condition, to be  admitted for routine postoperative care, CPM, and analgesia.           ______________________________  V. Charlesetta Shanks, M.D.     VEP/MEDQ  D:  03/15/2008  T:  03/15/2008  Job:  [id number]   cc:   Dennis Bast, MD  Fax: [phone]   [facility]

## 2011-01-15 NOTE — Op Note (Signed)
[name], [name]                ACCOUNT NO.:  [id number]   MEDICAL RECORD NO.:  [id number]          PATIENT TYPE:  AMB   LOCATION:  DSC                          FACILITY:  [facility]   PHYSICIAN:  Ollen Gross. Vernell Morgans, M.D. DATE OF BIRTH:  [birth year]   DATE OF PROCEDURE:  06/22/2007  DATE OF DISCHARGE:                               OPERATIVE REPORT   PREOPERATIVE DIAGNOSIS:  Right breast papilloma and calcifications.   POSTOPERATIVE DIAGNOSIS:  Right breast papilloma and calcifications.   PROCEDURE:  Right breast needle-localized lumpectomy.   SURGEON:  Ollen Gross. Vernell Morgans, M.D.   ANESTHESIA:  General via LMA.   PROCEDURE:  After informed consent was obtained, the patient was brought  to the operating, placed in supine position on the operating room table.  After adequate induction of general anesthesia, the patient's right  breast was prepped with Hibiclens and draped in usual sterile manner.  Earlier in the day, the patient had undergone a wire localization  procedure and the patient had two wires entering the right breast  laterally, marking a radially oriented duct system with calcifications.  A radial incision was made to encompass the entry site of both wires.  This incision was made with a 15 blade knife around the wires including  an ellipse of skin.  This incision was carried down through the skin and  subcutaneous tissue sharply with electrocautery.  A sort of wedge  resection of the duct system corresponded to the path of the wires, was  excised sharply with the electrocautery.  This was taken all the way  from up underneath the nipple out radially from there.  Once this area  had been completely excised, it was oriented with a short stitch being  superior, long stitch being lateral and skin was anterior and it was  sent to radiology where specimen x-ray showed many of the calcifications  in question to be in the specimen.  We could not go any further towards  the skin  underneath the nipple without fear of compromising the skin on  the nipple.  At this point, hemostasis was achieved using the Bovie  electrocautery.  The wound was irrigated with copious amounts of saline.  The wound was then infiltrated with 25% Marcaine.  The skin incision was  then closed with running 4-0 Monocryl subcuticular stitch and a  Dermabond dressing was applied.  The patient tolerated the procedure  well.  At the end of the case, all needle, sponge and instrument counts  were correct.  The patient was then awakened and taken to recovery in  stable condition.      Ollen Gross. Vernell Morgans, M.D.  Electronically Signed     PST/MEDQ  D:  06/22/2007  T:  06/23/2007  Job:  [id number]

## 2011-01-15 NOTE — Assessment & Plan Note (Signed)
[facility]                         GASTROENTEROLOGY OFFICE NOTE   LINDELL, TUSSEY                       MRN:          [id number]  DATE:06/09/2007                            DOB:          07/04/1941    [name] continues with watery diarrhea, refractory to all management.  She had terrible abdominal cramping with the Lotronex 1 mg twice a day  and discontinued this after several day's use.  She is using p.r.n.  Imodium, and her only complaint is one of frequent small-volume diarrhea  without weight loss.  In fact, she has gained 10 pounds in weight.  She  denies any other GI symptoms.  As per my previous notes, we tried all  therapy, including bile-salt binders, Entocort, treatment for bacterial  overgrowth, etc., etc.   Exam today shows a weight of 236 pounds and blood pressure is 152/74,  pulse was 86 and regular.  Her chest was clear and she was in a regular rhythm without murmurs,  gallops, or rubs.  Her abdominal exam was unremarkable except for some tenderness to the  right upper quadrant with deep inspiration when I touched her liver tip.  Otherwise, abdominal exam was normal.  Rectal exam was deferred.   ASSESSMENT:  1. Chronic rapid intestinal transit irritable bowel syndrome.  2. Chronic gastroesophageal reflux disease, on Nexium therapy.  3. Hypertensive cardiovascular disease, on atenolol therapy.  4. Possible occult pancreatitis, treated with Ultrase MT 20 one p.o.      t.i.d.   RECOMMENDATIONS:  1. Screening laboratory parameters.  2. Let her try Lotronex 0.5 mg twice a day.  3. Gastrointestinal followup p.r.n. as needed.  4. Other medications as per her family physician and cardiologist.     Vania Rea. Jarold Motto, MD, Caleen Essex, FAGA  Electronically Signed    DRP/MedQ  DD: 06/09/2007  DT: 06/09/2007  Job #: [id number]

## 2011-01-15 NOTE — Discharge Summary (Signed)
NAMEELENORA, HAWBAKER                ACCOUNT NO.:  [id number]   MEDICAL RECORD NO.:  [id number]          PATIENT TYPE:  INP   LOCATION:  1618                         FACILITY:  [facility]   PHYSICIAN:  Deidre Ala, M.D.    DATE OF BIRTH:  November 15, 1940   DATE OF ADMISSION:  03/15/2008  DATE OF DISCHARGE:  03/20/2008                               DISCHARGE SUMMARY   FINAL DIAGNOSES:  1. Degenerative joint disease left knee with chronic pain.  2. Postoperative blood loss anemia.  3. Hypertension.  4. History of cardiac dysfunction.  5. Coronary artery disease.  6. Type 2 diabetes mellitus, diet controlled.  7. Fever, resolved.   PROCEDURE:  On March 15, 2008, left total knee arthroplasty.  Surgeon,  Dr. Renae Fickle.   HISTORY:  This is a 70 year old Caucasian female who was followed by Dr.  Renae Fickle for chronic knee pain.  She was treated medically and subsequently  failed.  She is now ready for total knee arthroplasty of the left knee.   HOSPITAL COURSE:  Patient admitted to [facility] on March 15, 2008.  She underwent a left total knee arthroplasty with a DePuy LCS  system with MBT stem and rotating platform.  Patient tolerated the  procedure.  No intraoperative complications occurred.  Postoperatively,  the patient did very well.  She did have the postoperative blood loss  anemia but this remained stable throughout her stay. She did not require  any blood transfusions.  She did have an episode of what seems to be  maybe mild CHF.  She was given Lasix and this helped.  Patient is on  chronic Lasix therapy at home.  We did do a cardiology consult.  They  saw the patient and treated her accordingly.  She was started on Cipro  because she did have a fever and given prophylactically empiric Cipro 5  mg every 5 hours through her stay.  This was discontinued on the day of  discharge.  She had fevers as noted and these did resolve.  She had some  leukocytosis which had also resolved.  Overall,  she did well and she was  up and walking with physical therapy by the second postoperative day.  The dressings were changed at that time and she had a drain which was  discontinued on the second postoperative day.  The incision was healing  well at the time of discharge.  No sign of infection.  No drainage  noted.  No Homans sign.  No calf pain.  Peripheral pulses intact.  Neuro  grossly intact.  She was up and walking by herself on the third  postoperative day and on the fourth postoperative day she was ready for  discharge.   DISCHARGE MEDICATIONS:  1. Nexium 40 mg daily.  2. Pancrease 20 daily.  3. Atenolol 50 mg daily.  4. Lasix 40 mg daily.  5. She was given Dilaudid 2 mg 1-2 p.o. q.4-6 h p.r.n. pain at      discharge, #50 no refills.  6. Lovenox 30 mg to take subcu q.12 h for the  next 10 days for DVT      prophylaxis.  7. She will be on Robaxin 500 mg q.i.d. p.r.n.  8. She was given additional Lasix 20 mg to take twice a day or as      needed if she should have any shortness of breath.  9. She was given ferrous sulfate 325 mg t.i.d. for the next 30 days.   As noted, patient did well throughout her hospital stay.  No untoward  events occurred.  She was ready for discharge on her fourth  postoperative day which was March 20, 2008.   CONDITION ON DISCHARGE:  At this time, she is discharged home in  satisfactory and stable condition.   FOLLOW UP:  She will follow up with Dr. Renae Fickle in 10 days for removal of  staples at the incision sites.  She should continue to follow up with  cardiology with her regular cardiologist, Dr. Aleen Campi at home.      Phineas Semen, P.A.    ______________________________  Seth Bake. Charlesetta Shanks, M.D.    CL/MEDQ  D:  03/20/2008  T:  03/20/2008  Job:  [id number]

## 2011-01-15 NOTE — Assessment & Plan Note (Signed)
[facility]                         GASTROENTEROLOGY OFFICE NOTE   Tricia, Herring                       MRN:          [id number]  DATE:11/24/2007                            DOB:          [birth year]    [name] continues with chronic diarrhea, which is very periodic.  Actually she became constipated for a couple days on Lotronex 0.5 mg  twice a day. She describes some abdominal pain with this medication and  discontinued it. She has known fatty infiltration of her liver with  associated musculoskeletal right upper quadrant pain. Despite her  continued diarrhea, she is gaining weight. She denies rectal bleeding or  stools which sound likesteatorrhea type stools.   As from our previous notes, she has had extensive GI workups. The final  diagnosis here appears to be rapid intestinal transit with some  diarrhea post cholecystectomy. She remains very concerned that she has  occult pancreatic carcinoma. I recently did C19-9  pancreatic tumor  marker was normal. She does suffer from pancreatic divisum and has had  previous papilla stent placed in her bile ducts some 20 years ago at  [facility].   MEDICATIONS:  1. She empirically is on Ultrase MT 20--- 3x a day.  2. She also is on B12 replacement therapy.  3. Atenolol 50 mg a day.  4. Lasix 20 mg 2-3 times a week.  5. Triamterene-HCTZ 50/25 mg a day.  6. Nexium 40 mg a day.   Recent lab data showed a normal borderline hemoglobin A1c of 6. In  October, her blood work was all unremarkable. Her IBS serology was not  consistent with inflammatory bowel disease. On reviewing her assay  information, I could really not see any value on these studies including  ANCA that were abnormal.   She is a healthy-appearing white female in no distress, appearing her  stated age. She weighs 236 pounds, which is a gradual increase steadily  in her weight. It is of note that back in the 1990's she weighed 132  pounds. In 2002, she weighed 208 pounds. Her blood pressure was 144/72,  and pulse was 72 and regular. In general, the physical exam was not  repeated.   ASSESSMENT:  1. Suspected chronic pancreatitis, doing well on pancreatic extracts.  2. Rapid intestinal transit with associated nonsteatorrheic, nonbloody-      type diarrhea.  3. Patient has cancerophobia for occult malignancy and pancreatic      carcinoma.  4. Well-controlled gastroesophageal reflux disease.   RECOMMENDATIONS:  1. Trial of Imodium one tablet twice a day on a regular basis.  2. Continue all medications listed above.  3. Check abdominal-pelvic CT scan at her request. She does have a      history of IODINE allergy so we will premedicate her with      Benadryl and steroids.     Vania Rea. Jarold Motto, MD, Caleen Essex, FAGA  Electronically Signed    DRP/MedQ  DD: 11/24/2007  DT: 11/24/2007  Job #: [id number]   cc:   Almedia Balls. Randell Patient, M.D.

## 2011-01-18 NOTE — Assessment & Plan Note (Signed)
[facility]                           GASTROENTEROLOGY OFFICE NOTE   ROCKLYN, [name]                       MRN:          [id number]  DATE:07/03/2006                            DOB:          Jan 19, 1941    INDICATIONS:  [name] continues to complain of 10-12 loose, watery bowel  movements a day with crampy lower abdominal pain, all consistent with  colitis of unexplained etiology.  I will not go into detail for the  extensive GI workup she has had over the years, and everything has been  completely normal except for  re current diverticulosis.  She does have  known pancreas divisum, but has not had improvement to pancreatic extracts,  treatment for bacterial overgrowth syndrome or any other attempts at  treating chronic diarrhea.  I have never been able to really document that  she does have microscopic-collagenous colitis, but she would be certainly  set up for her patient profile to have this a problem.   As per my previous notes, she does have fatty infiltration of her liver, and  we referred her to dietary therapy, but she actually has gained weight since  visiting the dietician.  As per my previous notes, she has had multiple  surgical procedures including lysis of adhesions, hysterectomy, bilateral  oophorectomy by Dr. Wenda Low, also previous cholecystectomy.   PHYSICAL EXAMINATION:  VITAL SIGNS:  Her weight today is 227 pounds, blood  pressure 140/72, pulse 78 and regular.  ABDOMEN:  Multiple surgical scars with slight hepatomegaly and tenderness to  the right upper quadrant.  There are no other abdominal masses or areas of  tenderness.  Bowel sounds were normal.   ASSESSMENT/RECOMMENDATIONS:  I have gone ahead and empirically placed Shelbe  on Entocort 9 mg daily and given her enough samples to take for two weeks to  see if this makes a difference in her diarrhea syndrome.  Otherwise,  perplexed as to the cause of her recurrent  colitis-like picture.  I have  sent her blood off again for repeat inflammatory bowel disease serology  panel.  She is to call in two weeks time for progress report.  She will  continue her other medications as listed in her chart including B12  replacement therapy for her B12 deficiency.     Vania Rea. Jarold Motto, MD, Caleen Essex, FAGA  Electronically Signed    DRP/MedQ  DD: 07/03/2006  DT: 07/03/2006  Job #: [id number]   cc:   [place] Daphine Deutscher, MD

## 2011-01-18 NOTE — Assessment & Plan Note (Signed)
[facility]                           GASTROENTEROLOGY OFFICE NOTE   [name], [name]                       MRN:          [id number]  DATE:03/27/2006                            DOB:          1941-01-07    [name] continues with gas, bloating, right upper quadrant pain, and diarrhea  with no anorexia, weight loss, fever, chills, or specific hepatobiliary  complaints.  She has known pancreas disease and takes Pancrease tablets  three times a day.  She also has associated B12 deficiency, is on B12  replacement therapy.  She takes regular Nexium for acid reflux along with  Triamterine-HCTZ 50-25 mg a day, Lasix p.r.n. 20 mg, and Atenolol 50 mg a  day.   She has been treated with Xifaxan several months ago with mild improvement.  It is suspected that she has bacterial overgrowth syndrome associated with  her chronic pancreatitis.  She is status post cholecystectomy.  Stool exams  have been ordered and are currently pending.  On review of her chart, I  cannot see if she has had sprue antibody fat titers, but she has had  negative small bowel biopsies and normal liver function tests.  She is  referred to dietary for a low-fat weight reducing diet because of her fatty  liver and right upper quadrant pain, but she cannot afford this therapy.   EXAMINATION:  GENERAL:  Exam today shows her to be healthy and in no acute  distress without stigmatic chronic liver disease.  VITAL SIGNS:  She weighs 223 pounds and blood pressure is 120/60.  Pulse was  72 and regular. She had several well healed abdominal scars.  ABDOMEN:  Her liver was slightly enlarged right upper quadrant with a firm,  slightly tender edge.  There was no splenomegaly or other abdominal masses  or tenderness.  There is no significant distension and bowel sounds were  normal.   RECOMMENDATIONS:  1.  Repeat trial of  Xifaxan 400 mg t.i.d. along with Align probiotic      therapy for 10-14  days.  2.  Continue pancreatic extracts.  3.  Stool exams pending.  4.  Repeat laboratory evaluation including sprue anti-body patterns.  This      recently has been recognized as a syndrome of glutton sensitivity with      elevated glutton antibodies but normal small bowel biopsies.  5.  Again, the patient has been urged to do a Weight Watchers slow weight      reduction type of program for her hepatomegaly and fatty liver.  6.  Continue other medications as per primary care physicians.  7.  Gastrointestinal followup 4-6 weeks' time.                                   Vania Rea. Jarold Motto, MD, [name], [place]   DRP/MedQ  DD:  03/27/2006  DT:  03/27/2006  Job #:  [id number]

## 2011-01-18 NOTE — Assessment & Plan Note (Signed)
[facility]                         GASTROENTEROLOGY OFFICE NOTE   TANAKA, GILLEN                       MRN:          [id number]  DATE:08/19/2006                            DOB:          Jun 24, 1941    [name] continues with watery diarrhea and some local anal irritation.  She has no abdominal pain except for over her right upper quadrant where  she has known fatty infiltration of her liver, and she continues to gain  weight up to 228 pounds.  She denies reflux symptoms, nausea, vomiting,  bloody stools, or any systemic complaints.   Her medications include:  1. Nexium 40 mg daily.  2. A trial of empiric Entocort 9 mg only made her symptoms worse.   She has failed at almost all attempts to treat her diarrhea.   On speaking closely with [name], she says Imodium helps her symptoms but  she was not aware that she could use this medication.  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  I placed her on Colestid  2 g in mid a.m. along with p.r.n. Imodium 1-2 times a day.  I will see  how she does symptomatically.  We may want to gradually go up on her  Colestid depending on clinical response.  I have sent her by the lab to  check 24-hour urine for 5-HIAA.   Recent laboratory data showed that inflammatory bowel disease serologies  were negative.  Previous sprue antibodies and multiple small bowel  biopsies have been normal.  Serum carotene is not seen any evidence to  suggest malabsorption.  She will see me back in 1 month's time.     Vania Rea. Jarold Motto, MD, Caleen Essex, FAGA  Electronically Signed    DRP/MedQ  DD: 08/19/2006  DT: 08/19/2006  Job #: [id number]

## 2011-01-18 NOTE — H&P (Signed)
[name], [name]                ACCOUNT NO.:  [id number]   MEDICAL RECORD NO.:  [id number]          PATIENT TYPE:  INP   LOCATION:  1618                         FACILITY:  [facility]   PHYSICIAN:  Deidre Ala, M.D.    DATE OF BIRTH:  04-18-1941   DATE OF ADMISSION:  03/15/2008  DATE OF DISCHARGE:  03/20/2008                              HISTORY & PHYSICAL   CHIEF COMPLAINT:  Chronic left knee pain.   HISTORY OF PRESENT ILLNESS:  A 70 year old, Caucasian female with a  history of DJD left knee.  The patient was followed by Dr. Renae Fickle and  treated conservatively.  Subsequently, he has failed conservative  management and is ready for total knee arthroplasty.  He is to be  scheduled for this.   PAST MEDICAL HISTORY:  The patient has a past history of cholecystectomy  and hysterectomy.  She has had pancreatic stent placement for chronic  pancreatitis.  She did have forearm fracture in the past.   PRESENT MEDICATION:  1. The patient to take Nexium 40 mg daily.  2. Pancrease plus 20 one daily.  3. Atenolol 50 mg daily.  4. Lasix 20 mg daily.   ALLERGIES:  CODEINE and IODINE.   FAMILY HISTORY:  Positive for coronary artery disease, hypertension,  diabetes mellitus, cancer.   REVIEW OF SYSTEMS:  Shows that the patient is positive for hypertension  and irritable bowel syndrome.  No history of COPD,PND, PAD, syncope,  hemoptysis, hematemesis or melena.   PHYSICAL EXAMINATION:  GENERAL:  Well-developed, well-nourished, 66-year-  old, Caucasian female, alert, oriented, and cooperative on examination.  HEENT:  [place]/AT.  EOMI.  PERRL.  Oropharynx clear.  Mucous membranes are  pink and moist.  NECK:  Supple without JVD, lymphadenopathy, or thyromegaly.  No carotid  bruits noted.  Trachea is midline.  CHEST:  Symmetrical respirations, clear to auscultation.  No wheeze,  rhonchi, or rales noted.  CV:  Regular rate and rhythm without murmur, rub, or gallop.  ABDOMEN:  Soft.  Bowel sounds  present.  No palpable pulsatile masses.  No HSM.  No hernias.  GU:  Deferred.  RECTAL:  Deferred.  EXTREMITIES:  Without clubbing, cyanosis, or edema.  There is positive  tenderness to palpation of the left knee.  Peripheral pulses intact.  NEURO:  Cranial nerves II-XII grossly without focal deficits __________  bilaterally.   IMPRESSION:  1. Degenerative joint disease left knee with chronic pain.  2. History of irritable bowel syndrome.  3. Hypertension.   PLAN:  The patient will undergo total left knee arthroplasty.      Phineas Semen, P.A.    ______________________________  Seth Bake. Charlesetta Shanks, M.D.    CL/MEDQ  D:  04/19/2008  T:  04/19/2008  Job:  [id number]

## 2011-01-18 NOTE — Assessment & Plan Note (Signed)
[facility]                         GASTROENTEROLOGY OFFICE NOTE   HENSLEY, AZIZ                       MRN:          [id number]  DATE:10/20/2006                            DOB:          12/02/1940    [name] continues to have diarrhea but only took her Colestid 1 g a day.  For the last 3 days she has had left lower quadrant pain without fever  or chills, but has persistent nonbloody diarrhea.  The last colonoscopy  performed on November 20, 2005, did show subacute diverticulitis.   She has a chronic diarrhea felt secondary to rapid intestinal transit,  an element of bile-salt enteropathy.  She also has a history of  hypertension, suspected possible chronic pancreatitis, and associated  B12 deficiency.   She is awake and alert in no acute distress.  She weighs 231 pounds, the  blood pressure 142/74.  Pulse was 76 and regular.  Her abdominal exam  was remarkable for tenderness in the left lower quadrant area without  rebound.  I could not appreciate abdominal masses or any organomegaly.  Bowel sounds were normal.  Rectal exam was deferred.   ASSESSMENT:  1. Chronic diarrhea state with a negative extensive gastrointestinal      workup otherwise.  2. Probable diverticulitis.  3. Known pancreas divisum with a suspected chronic pancreatitis.   RECOMMENDATIONS:  1. Check CBC and sed rate.  2. Treat for her diverticulitis with metronidazole 500 mg twice a day      for 10 days along with Cipro 500 mg twice a day for 10 days and      p.r.n. Darvocet 100.  3. Continue other medications listed above with GI followup in several      weeks' time.     Vania Rea. Jarold Motto, MD, Caleen Essex, FAGA  Electronically Signed    DRP/MedQ  DD: 10/20/2006  DT: 10/20/2006  Job #: [id number]

## 2011-01-18 NOTE — Assessment & Plan Note (Signed)
[facility]                           GASTROENTEROLOGY OFFICE NOTE   [name], [name]                       MRN:          [id number]  DATE:04/24/2006                            DOB:          Sep 27, 1940    Cambreigh continues to complain of gas, bloating, not any better after taking  empiric therapy with Xifaxan for presumed bacterial overgrowth syndrome.  She continues to complain of right upper quadrant pain and has an enlarged,  tender liver on exam.  Recent ultrasound exam has confirmed fatty  infiltration of the liver with previous cholecystectomy, and she does have a  prominent scar in the right upper quadrant area.  Liver function tests have  been normal.  Other recent labs show normal celiac antibodies, normal  carotene of 108, negative stool exams for C. difficile and routine culture  and sensitivity.  She does take daily Nexium for acid reflux and Pancrease  tablets, 1 three times a day, for presumed chronic pancreatitis secondary to  pancreas divisum.   I have referred Hayde today to dietary therapy, and I think a lot of her  problems would be resolved with a low fat-reducing diet and losing 25-30  pounds.  She is to stick with her dietary protocol, continue her other  medications, and check back with me in several months time for followup.                                   Vania Rea. Jarold Motto, MD, [name], [place]   DRP/MedQ  DD:  04/24/2006  DT:  04/24/2006  Job #:  [id number]

## 2011-01-18 NOTE — Assessment & Plan Note (Signed)
[facility]                         GASTROENTEROLOGY OFFICE NOTE   ICY, FUHRMANN                       MRN:          [id number]  DATE:12/04/2006                            DOB:          01/13/1941    SUBJECTIVE:  [name]'s diarrhea has returned with a vengeance.  I  reviewed her chart in detail and everything has been negative.  She was  recently treated for diverticulitis with mild improvement.  A 24-hour  urine for 5HIAA was negative.   PLAN:  I have decided to place [place] on Lotronex 1 mg twice a day and  have reviewed the prior problems of ischemic colitis.  We are to get a  patient contract for her to sign and then start her on this medication.  We will see how she does symptomatically.  Her diarrhea work up has  otherwise been entirely negative.     Vania Rea. Jarold Motto, MD, Caleen Essex, FAGA  Electronically Signed    DRP/MedQ  DD: 12/04/2006  DT: 12/04/2006  Job #: [id number]

## 2011-01-21 ENCOUNTER — Telehealth: Payer: Self-pay | Admitting: Gastroenterology

## 2011-01-22 MED ORDER — PANTOPRAZOLE SODIUM 40 MG PO TBEC: 40.0000 mg | DELAYED_RELEASE_TABLET | Freq: Every day | ORAL | Status: DC

## 2011-01-22 NOTE — Telephone Encounter (Signed)
Patient is on Plavix now and has read that you can not take Nexium with Plavix and would like to change to protonix which her husband is on with his plavix. Protonix has been sent to the pharm.

## 2011-03-12 ENCOUNTER — Ambulatory Visit
Admission: RE | Admit: 2011-03-12 | Discharge: 2011-03-12 | Disposition: A | Discharge status: Home / Self Care | Payer: Medicare Other | Source: Ambulatory Visit | Attending: Cardiology | Admitting: Cardiology

## 2011-03-12 ENCOUNTER — Other Ambulatory Visit: Payer: Self-pay | Admitting: Cardiology

## 2011-03-18 ENCOUNTER — Ambulatory Visit (HOSPITAL_COMMUNITY)
Admission: RE | Admit: 2011-03-18 | Discharge: 2011-03-19 | Disposition: A | Discharge status: Home / Self Care | Payer: Medicare Other | Source: Ambulatory Visit | Attending: Cardiology | Admitting: Cardiology

## 2011-03-18 DIAGNOSIS — Z7902 Long term (current) use of antithrombotics/antiplatelets: Secondary | ICD-10-CM | POA: Insufficient documentation

## 2011-03-18 DIAGNOSIS — Z01818 Encounter for other preprocedural examination: Secondary | ICD-10-CM | POA: Insufficient documentation

## 2011-03-18 DIAGNOSIS — I739 Peripheral vascular disease, unspecified: Secondary | ICD-10-CM | POA: Insufficient documentation

## 2011-03-18 DIAGNOSIS — M1990 Unspecified osteoarthritis, unspecified site: Secondary | ICD-10-CM | POA: Insufficient documentation

## 2011-03-18 DIAGNOSIS — T82897A Other specified complication of cardiac prosthetic devices, implants and grafts, initial encounter: Secondary | ICD-10-CM | POA: Insufficient documentation

## 2011-03-18 DIAGNOSIS — E119 Type 2 diabetes mellitus without complications: Secondary | ICD-10-CM | POA: Insufficient documentation

## 2011-03-18 DIAGNOSIS — Z91041 Radiographic dye allergy status: Secondary | ICD-10-CM | POA: Insufficient documentation

## 2011-03-18 DIAGNOSIS — I10 Essential (primary) hypertension: Secondary | ICD-10-CM | POA: Insufficient documentation

## 2011-03-18 DIAGNOSIS — I2510 Atherosclerotic heart disease of native coronary artery without angina pectoris: Secondary | ICD-10-CM | POA: Insufficient documentation

## 2011-03-18 DIAGNOSIS — Z79899 Other long term (current) drug therapy: Secondary | ICD-10-CM | POA: Insufficient documentation

## 2011-03-18 DIAGNOSIS — K8689 Other specified diseases of pancreas: Secondary | ICD-10-CM | POA: Insufficient documentation

## 2011-03-18 DIAGNOSIS — E785 Hyperlipidemia, unspecified: Secondary | ICD-10-CM | POA: Insufficient documentation

## 2011-03-18 DIAGNOSIS — Z7982 Long term (current) use of aspirin: Secondary | ICD-10-CM | POA: Insufficient documentation

## 2011-03-18 DIAGNOSIS — Y831 Surgical operation with implant of artificial internal device as the cause of abnormal reaction of the patient, or of later complication, without mention of misadventure at the time of the procedure: Secondary | ICD-10-CM | POA: Insufficient documentation

## 2011-03-18 DIAGNOSIS — I701 Atherosclerosis of renal artery: Secondary | ICD-10-CM | POA: Insufficient documentation

## 2011-03-18 HISTORY — PX: OTHER SURGICAL HISTORY: SHX169

## 2011-03-18 HISTORY — PX: CORONARY ANGIOPLASTY WITH STENT PLACEMENT: SHX49

## 2011-03-18 LAB — GLUCOSE, CAPILLARY
Glucose-Capillary: 142 mg/dL — ABNORMAL HIGH (ref 70–99)
Glucose-Capillary: 183 mg/dL — ABNORMAL HIGH (ref 70–99)

## 2011-03-19 DIAGNOSIS — M79609 Pain in unspecified limb: Secondary | ICD-10-CM

## 2011-03-19 LAB — BASIC METABOLIC PANEL
GFR calc Af Amer: >60 mL/min (ref >60)
GFR calc non Af Amer: 60 mL/min — ABNORMAL LOW (ref >60)
Glucose, Bld: 202 mg/dL — ABNORMAL HIGH (ref 70–99)
Potassium: 4.1 mEq/L (ref 3.5–5.1)
Sodium: 136 mEq/L (ref 135–145)

## 2011-03-19 LAB — CBC
Hemoglobin: 11.5 g/dL — ABNORMAL LOW (ref 12.0–15.0)
MCHC: 32.9 g/dL (ref 30.0–36.0)

## 2011-03-20 NOTE — Discharge Summary (Signed)
  NAMEMULKI, ROESLER NO.:  [id number]  MEDICAL RECORD NO.:  [id number]  LOCATION:  6525                         FACILITY:  [facility]  PHYSICIAN:  Landry Corporal, MDDATE OF BIRTH:  1941-05-28  DATE OF ADMISSION:  03/18/2011 DATE OF DISCHARGE:                              DISCHARGE SUMMARY   ADDENDUM: Ms. Rademaker has a history of IV contrast allergy.  She has had swelling, hives, and choking after an MRI in the past.  She was treated with Solu- Medrol, Pepcid, and Benadryl prior to her catheterization this admission and tolerated this well.     Abelino Derrick, P.A.   ______________________________ Landry Corporal, MD    LKK/MEDQ  D:  03/19/2011  T:  03/19/2011  Job:  [id number]  Electronically Signed by Corine Shelter P.A. on 03/19/2011 12:13:28 PM Electronically Signed by Bryan Lemma MD on 03/20/2011 01:19:52 AM

## 2011-03-20 NOTE — Cardiovascular Report (Signed)
[name], Herring NO.:  [id number]  MEDICAL RECORD NO.:  [id number]  LOCATION:  6525                         FACILITY:  [facility]  PHYSICIAN:  Landry Corporal, MD DATE OF BIRTH:  1941/03/29  DATE OF PROCEDURE:  03/18/2011 DATE OF DISCHARGE:                           CARDIAC CATHETERIZATION   PROCEDURES PERFORMED: 1. Left heart catheterization without left ventriculogram from the 6-     French right femoral artery access. 2. Native coronary angiography. 3. Intracoronary nitroglycerin injection. 4. Abdominal aortogram by aortoiliac runoff for visualization of renal     superior. 5. Percutaneous coronary intervention of the left anterior descending     70% in-stent restenosis with placement of Promus drug-eluting     stent, 2.5 mm x 16 mm, postdilated to 3.76 mm. 6. Cutting balloon angioplasty of an ostial bifurcating second     diagonal branch through the newly placed stent.  INDICATIONS: 1. Recurrent angina and heart failure 2. Known coronary artery disease, with status post bare-metal stent     placement in November2011.  BRIEF HISTORY:  [name] is a very pleasant 70 year old woman with a history of bare-metal stent placement in the proximal RCA and proximal portion of the mid LAD bifurcation as segment with bare metal stents placed 3.0 x 18 mm Integrity stent in the  RCA and 2.5 x 14 mm Integrity bare-metal stent to the LAD.  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  For that reason, a bare-metal stent was placed.  She was then presented to see me in the office for progressively worsening dyspnea on exertion and anginal symptoms.  It is also noted to have bilateral renal artery stenosis by noninvasive evaluation with Dopplers.  Due to her elevated,  blood pressure, she is now referred for renal angiography as well as diagnostic coronary angiography to reevaluate her coronary anatomy.  The  risks, benefits, alternatives and indications of procedure were explained to the patient in detail and informed consent was obtained with a signed form placed on the chart.  PROCEDURE:  The patient was brought to the second floor [facility], prepped and draped in usual sterile fashion. After time-out period was performed, the patient was sedated with intravenous Versed and fentanyl.  The right femoral head was then localized using tactile and fluoroscopic guidance, and the right groin was anesthetized using 1% subcutaneous lidocaine.  The right common femoral artery was then accessed using modified Seldinger technique with placement of 6-French sheath.  The sheath was aspirated and flushed and first a 5-French angled pigtail catheter was advanced up to the descending aorta to just below the diaphragm.  A abdominal aortogram was performed, 20 mm contrast per total of 1 second visualizing bilateral renal arteries and the aortoiliac bifurcation and internal external iliacs.  After this was completed, the pigtail catheter was exchanged for a 5-French Judkins'  catheter, which was advanced over wire engaged through left coronary artery.  Multiple angiographic views of left coronary artery system were obtained.  The catheter was exchanged for a 5-French Judkins right 4-0 catheter, which was advanced across over wire and  used to engage the right coronary artery and multiple angiographic views of the right coronary artery system were obtained.  With the injection in the native left coronary artery system 200 mcg of intracoronary nitroglycerin was injected for any evidence of any spasm.  At the conclusion of the diagnostic portion of the right catheterization, decision was made to proceed to percutaneous coronary intervention.  HEMODYNAMICS: 1. Left ventricular pressures 164/60 mmHg with an LVEDP of 28 mmHg. 2. Central aortic pressure 158/68 mmHg with a mean of 103  mmHg. 3. Left ventriculogram was not performed as the patient has had     relatively recent evaluation of LV Function. 4. Abdominal aortogram demonstrated normal caliber aorta extending to the      bifurcation into common as well as external, internal iliacs.     There is no significant disease noted.   5.  The Right kidney is somewhat inferior and had 2 smaller caliber arteries with both of     which have maybe 40% stenoses.   6   The left renal artery is a moderate caliber artery and has a 60% proximal lesion that is not      likley to be flow-limiting.  CORONARY ANGIOGRAPHY FINDINGS: 1. The right coronary artery is a large dominant vessel is a proximal     stent of 30% diffuse in-stent restenosis with a step-up after the     stent.  There is no flow-limiting lesion and no evidence of     dissection.  This gives rise to a posterior ascending artery and     posterolateral branch. 2. The left main is a short vessel bifurcates normally into LAD and     circumflex.  No significant disease in this vessel. 3. Circumflex.  Moderate-to-large caliber vessel which gives rise to     essentially the first obtuse marginal branch as well a small AV Groove continuation.     There is no angiographic evidence of disease in this vessel. 4. LAD is a moderate caliber vessel which gives rise to the small     proximal diagonal and septal perforators and then back to the     takeoff of a moderate caliber bifurcating diagonal branch.  There     is a previously placed bare-metal stent, which has a roughly 60-70%     stenosis right at the takeoff of the diagonal.  The diagonal itself     has ostial 80-90% lesion.  The remainder of the LAD has no real     significant disease.  CATHETERIZATION STATISTICS: 1. Sedation with 6 mg IV Versed and 20 mcg IV fentanyl. 2. The patient was premedicated on 20 mcg of Solu-Medrol for     presumptive contrast allergy.  She was also given 25 mg of Benadryl     and 20 mg of  Pepcid.  IMPRESSION: 1. Severe in-stent restenosis of left anterior descending bare-metal     stent with roughly 70% lesion as well as the even more severe     ostial 80-90% of bifurcating second diagonal branch. 2. No Significant in-stent restenosis of the right coronary 20-30%. 3. No significant non-flow limiting stenosis of the left renal artery. 4. No significant elevated LVEDP.  PLAN: 1. Initial plan was to proceed percutaneous intervention of the LAD     and diagonal vessels. With a 6-French sheath in place.  Angiomax bolus and drips was administered.  The patient was given the remaining 225 mg of Plavix. She has already been on  25 mg daily. 1. At that time, a  6-French XB LAD 3.5 catheter was advanced over     wire and used to engage the left coronary artery.  It was somewhat     deep seated but was freely able to be  pulled back.  Lesion #1:  Ostial second diagonal 80-90% -- reduced to less than 20%. 1. Wire:  Psychologist, forensic. 2. Predilatation balloon:  1.5 x 6 mm Mini Trek over wire balloons,     which was slightly advanced through the old stent.  Inflated     initially to 8 atmospheres at 45 seconds followed by another     inflation to 10 atmospheres at 30 seconds through the stent. 3. Cutting balloon with Flextone 2.0 x 10 mm cutting balloon inflated     initially to ensure 64 seconds and then at 6 atmospheres at 20     seconds.  Balloon angioplasty.  Cutting balloon angioplasty demonstrated excellent TIMI 3 flow in the diagonal branch with mostly new resolution of the stenosis.  There was evidence of plaque shifting notably worsening of the LAD lesion down to 70-80%.  Lesion #2:  Mid LAD in-stent restenosis 70% reduced to 0%. 1. Wire:  BMW. 2. Predilatation balloon:  Apex Monorail 2.0 mm x 10 mm. 3. Cutting balloon inflated to 8 atmospheres at 60 seconds. 4. Stent Promus drug-eluting stent 2.5 mm x 16 mm  inflated to 12     atmospheres at 45 seconds, and 14  atmospheres at 30 seconds.     The stent was placed overlapping the proximal portion of the stent leaving      the distal  2mm of the original bare-metal stent uncovered.     The Prowater long wire was removed out of the diagonal prior to postdilatation      of the stent in the LAD and advnaced in a prolapsed manner to be ensure that I     was within stent, through the newly placed stent down the LAD. 5. Postdilatation balloon.  2.75 mm x 12 mm inflated at 12 atmospheres     for 45 seconds followed by 14 atmospheres for 29 seconds.  After initial post stent and postdilatation angiography revealed that there was at least 60-70% stenosis in the ostium of the diagonal and the decision was made then pulled back into redo balloon angioplasty of this ostium through the new stent.    I then attempted to  use the BMW Wire to pull back into the stent to cross into the Diagonal branch, but was not sccessful. I then was able to use the Prowater wire to  successfully re-advanced through the newly placed stent beyond the ostium of the diagonal branch with the plan to repeat angioplasty of the ostial lesion.  Lesion #1:  Ostial second diagonal 80-90% -- reduced to less than 20%. 1. Wire:  Psychologist, forensic. 2. Predilatation Balloon: 1.5 x 6 mm mini Trek over wire balloon, re-inserted  8 atmospheres of 12 seconds more proximally, and  6 atmospheres of 30 seconds, then just beyond the stent,  8 atmospheres of 18 seconds with the dot at the right at the ostium. 3. Flextone 2.0 x 10 mm Cutting balloon, reinserted       2 atmospheres for 64 seconds  4 atmospheres for 15 seconds  8 atmospheres for 50 seconds.  Scout angiography. This time revealed significant reduction in the ostial diagonal stenosis with less than 20% residual.  Post wire angiography again revealed  excellent stent positioned with no less than 20% residual restenosis in the ostium of the diagonal branch.  IMPRESSION: 1. Successful  bifurcation intervention with stent placement and     cutting balloon angioplasty of the mid LAD at the second diagonal     branch. This was distally at 2.5 mm x 60 mm Promus drug-eluting     stent that overlap proximal portion of the previously placed bare-     metal stent with a short portion of the bare-metal stent extended     beyond any notably lesion.  This was post dilated approximately 2.7     mm.  The ostium of second diagonal branch was treated with cutting     balloon atherectomy with 2.0 x 10 mm balloon with maybe less than     20%  residual.  PLAN: 1. The patient will be on dual-antiplatelet therapy for at least 1     year.  We will continue to hydrate and treated with prednisone for     presumed allergy.  We will give her Lasix based on LVEDP.  We will     give her Lasix, the     time of returning to the floor as well as several hours after     sheath removal. 2. She remained overnight and continues to be stable, and will likely     be  discharged in the morning.  She will follow up with me later on     this week.          ______________________________ Landry Corporal, MD     DWH/MEDQ  D:  03/18/2011  T:  03/19/2011  Job:  [id number]  cc:   [facility]  Electronically Signed by Bryan Lemma MD on 03/20/2011 01:19:29 AM

## 2011-03-20 NOTE — Discharge Summary (Signed)
[name], PLANT NO.:  [id number]  MEDICAL RECORD NO.:  [id number]  LOCATION:  6525                         FACILITY:  [facility]  PHYSICIAN:  Landry Corporal, MDDATE OF BIRTH:  August 19, 1941  DATE OF ADMISSION:  03/18/2011 DATE OF DISCHARGE:  03/19/2011                              DISCHARGE SUMMARY   DISCHARGE DIAGNOSES: 1. Coronary artery disease with previous left anterior descending     artery and right coronary artery bare-metal stenting, November     2011, done prior to back surgery, in-stent restenosis this     admission and the left anterior descending artery treated with an     left anterior descending artery drug-eluting stent and diagonal     drug-eluting stent. 2. Hypertension, better controlled at discharge. 3. Peripheral vascular disease with moderate renal artery stenosis     this admission, no intervention planned. 4. Type 2 non-insulin-dependent diabetes. 5. Degenerative joint disease, status post back surgery, January 2012. 6. Pancreatic insufficiency, on Pancrease. 7. Dyslipidemia with a history of statin intolerance.  HOSPITAL COURSE:  The patient is a 70 year old female followed by Dr. Herbie Baltimore with a history as noted above.  In November 2011, she had preoperative workup prior to back surgery.  This suggested progression of coronary artery disease.  She underwent catheterization and subsequent bare-metal stenting to the RCA and LAD, had a bifurcation. She had her surgery without problem in January 2012.  She saw Dr. Herbie Baltimore back recently and had increasing chest pain and shortness of breath.  He was concern for progression of her coronary artery disease. He also notes that she previously had a history of 50-70% bilateral renal artery stenosis and recently her blood pressure was poorly controlled.  He was also concerned of progressive renal artery stenosis. She was admitted for diagnostic catheterization.  Catheterization was done on  March 18, 2011, and revealed 80% in-stent restenosis at the takeoff of the second diagonal and the LAD bare-metal stent.  The previously placed RCA bare-metal stent was patent.  Renal artery stenosis was 40% on the right and 60% on the left.  She underwent intervention with Promus stenting to the LAD and into the second diagonal.  Renal arteries were not intervened on.  She tolerated this well.  Her blood pressure is under better control at discharge.  Because of the possibility of significant renal artery stenosis, she has not been on an ACE inhibitor or an ARB.  This may be added as an outpatient. She will follow up with Dr. Herbie Baltimore on Thursday.  LABS AT DISCHARGE:  White count 9.5, hemoglobin 11.5, hematocrit 35.8, platelets 230.  Please see [facility] for complete discharge medications.  Recently, she could not afford Bystolic and put her on Coreg 16.1 mg b.i.d. Hydralazine 50 mg b.i.d. has recently been added as an outpatient.  DISPOSITION:  The patient was discharged in stable condition and will follow up with Dr. Herbie Baltimore as an outpatient later this week.     Abelino Derrick, P.A.   ______________________________ Landry Corporal, MD    LKK/MEDQ  D:  03/19/2011  T:  03/19/2011  Job:  [id number]  Electronically Signed by Corine Shelter P.A. on  03/19/2011 12:13:19 PM Electronically Signed by Bryan Lemma MD on 03/20/2011 01:19:42 AM

## 2011-04-02 LAB — POCT ACTIVATED CLOTTING TIME: Activated Clotting Time: 193 seconds

## 2011-05-30 LAB — DIFFERENTIAL
Basophils Absolute: 0
Basophils Relative: 1
Eosinophils Relative: 2
Monocytes Absolute: 0.6

## 2011-05-30 LAB — COMPREHENSIVE METABOLIC PANEL
ALT: 18
AST: 21
Albumin: 3.2 — ABNORMAL LOW
Alkaline Phosphatase: 74
Chloride: 102
GFR calc Af Amer: >60
Potassium: 4.1
Sodium: 139
Total Bilirubin: 0.7

## 2011-05-30 LAB — URINE CULTURE: Special Requests: NEGATIVE

## 2011-05-30 LAB — URINALYSIS, ROUTINE W REFLEX MICROSCOPIC
Bilirubin Urine: NEGATIVE
Glucose, UA: NEGATIVE
Ketones, ur: NEGATIVE
Specific Gravity, Urine: 1.016
pH: 7

## 2011-05-30 LAB — TYPE AND SCREEN
ABO/RH(D): A NEG
Antibody Screen: POSITIVE
DAT, IgG: NEGATIVE
PT AG Type: NEGATIVE

## 2011-05-30 LAB — CBC
HCT: 38.4
Platelets: 212
WBC: 7.3

## 2011-05-30 LAB — CK TOTAL AND CKMB (NOT AT ARMC)
CK, MB: 1
Relative Index: 0.9

## 2011-05-30 LAB — TROPONIN I: Troponin I: 0.01

## 2011-05-31 LAB — CBC
HCT: 28.4 — ABNORMAL LOW
Hemoglobin: 9.2 — ABNORMAL LOW
MCHC: 33.6
MCHC: 34.1
MCHC: 34.2
MCV: 84.5
Platelets: 232
RBC: 3.2 — ABNORMAL LOW
RDW: 14.1
WBC: 10.5
WBC: 11.5 — ABNORMAL HIGH

## 2011-05-31 LAB — BASIC METABOLIC PANEL
BUN: 7
CO2: 29
CO2: 32
Calcium: 8.2 — ABNORMAL LOW
Chloride: 100
Creatinine, Ser: 0.75
GFR calc Af Amer: >60
Glucose, Bld: 152 — ABNORMAL HIGH
Glucose, Bld: 153 — ABNORMAL HIGH
Potassium: 4.1

## 2011-05-31 LAB — CK TOTAL AND CKMB (NOT AT ARMC)
CK, MB: 0.9
Total CK: 104

## 2011-06-03 LAB — POCT I-STAT 4, (NA,K, GLUC, HGB,HCT)
HCT: 40
Hemoglobin: 13.6
Potassium: 4.3
Sodium: 138

## 2011-06-12 LAB — DIFFERENTIAL
Basophils Absolute: 0
Basophils Relative: 1
Eosinophils Absolute: 0.1
Eosinophils Relative: 2
Monocytes Absolute: 0.8 — ABNORMAL HIGH
Monocytes Relative: 10

## 2011-06-12 LAB — CBC
HCT: 38.3
Hemoglobin: 13.1
Platelets: 244
RDW: 13.6
WBC: 8.3

## 2011-06-12 LAB — COMPREHENSIVE METABOLIC PANEL
ALT: 19
Albumin: 3.3 — ABNORMAL LOW
Alkaline Phosphatase: 79
Chloride: 102
Potassium: 4
Sodium: 136
Total Bilirubin: 0.7
Total Protein: 6.9

## 2011-07-01 ENCOUNTER — Other Ambulatory Visit: Payer: Self-pay | Admitting: Rehabilitation

## 2011-07-01 DIAGNOSIS — M545 Low back pain: Secondary | ICD-10-CM

## 2011-07-05 ENCOUNTER — Ambulatory Visit
Admission: RE | Admit: 2011-07-05 | Discharge: 2011-07-05 | Disposition: A | Discharge status: Home / Self Care | Payer: Medicare Other | Source: Ambulatory Visit | Attending: Rehabilitation | Admitting: Rehabilitation

## 2011-07-05 DIAGNOSIS — M545 Low back pain: Secondary | ICD-10-CM

## 2011-10-09 DIAGNOSIS — Z79899 Other long term (current) drug therapy: Secondary | ICD-10-CM | POA: Diagnosis not present

## 2011-10-09 DIAGNOSIS — E782 Mixed hyperlipidemia: Secondary | ICD-10-CM | POA: Diagnosis not present

## 2011-10-16 ENCOUNTER — Telehealth: Payer: Self-pay | Admitting: Gastroenterology

## 2011-10-16 MED ORDER — PROMETHAZINE HCL 25 MG PO TABS: 25.0000 mg | ORAL_TABLET | Freq: Four times a day (QID) | ORAL | Status: AC | PRN

## 2011-10-16 MED ORDER — AMBULATORY NON FORMULARY MEDICATION: Status: DC

## 2011-10-16 MED ORDER — PROMETHAZINE HCL 25 MG RE SUPP: 25.0000 mg | Freq: Four times a day (QID) | RECTAL | Status: AC | PRN

## 2011-10-16 NOTE — Telephone Encounter (Signed)
Pt with hx of GERD, Pancreas Divisum, IBS, HH, Esophageal Stricture, Diverticulosis; on ZenPep. Husband reports they were coming back from a trip Monday and pt started throwing up and didn't stop until last pm, then she started having diarrhea. Pt's husband stated she hasn't tried to eat or drink. Informed him there is a noro virus going around, but he thinks it has to do with her long hx.    Spoke with Dr Jarold Motto who also thinks she has a virus. Informed husband we will order Phenergan po and PR, GI Cocktail and he should buy OTC Pepto Bismol per bottle. Keep pt well hydrated and let us know if no better; he stated understanding.

## 2011-10-30 DIAGNOSIS — I25119 Atherosclerotic heart disease of native coronary artery with unspecified angina pectoris: Secondary | ICD-10-CM | POA: Insufficient documentation

## 2011-10-30 DIAGNOSIS — I2510 Atherosclerotic heart disease of native coronary artery without angina pectoris: Secondary | ICD-10-CM | POA: Insufficient documentation

## 2011-10-30 DIAGNOSIS — I25709 Atherosclerosis of coronary artery bypass graft(s), unspecified, with unspecified angina pectoris: Secondary | ICD-10-CM | POA: Insufficient documentation

## 2011-11-04 DIAGNOSIS — I10 Essential (primary) hypertension: Secondary | ICD-10-CM | POA: Diagnosis not present

## 2011-11-04 DIAGNOSIS — R079 Chest pain, unspecified: Secondary | ICD-10-CM | POA: Diagnosis not present

## 2011-11-04 DIAGNOSIS — I2510 Atherosclerotic heart disease of native coronary artery without angina pectoris: Secondary | ICD-10-CM | POA: Diagnosis not present

## 2011-11-07 DIAGNOSIS — E119 Type 2 diabetes mellitus without complications: Secondary | ICD-10-CM | POA: Diagnosis not present

## 2011-11-07 DIAGNOSIS — R079 Chest pain, unspecified: Secondary | ICD-10-CM | POA: Diagnosis not present

## 2011-11-07 DIAGNOSIS — I10 Essential (primary) hypertension: Secondary | ICD-10-CM | POA: Diagnosis not present

## 2011-11-07 DIAGNOSIS — I2510 Atherosclerotic heart disease of native coronary artery without angina pectoris: Secondary | ICD-10-CM | POA: Diagnosis not present

## 2011-11-25 DIAGNOSIS — R579 Shock, unspecified: Secondary | ICD-10-CM | POA: Diagnosis not present

## 2011-11-25 DIAGNOSIS — I10 Essential (primary) hypertension: Secondary | ICD-10-CM | POA: Diagnosis not present

## 2011-11-25 DIAGNOSIS — R0602 Shortness of breath: Secondary | ICD-10-CM | POA: Diagnosis not present

## 2011-12-10 DIAGNOSIS — I701 Atherosclerosis of renal artery: Secondary | ICD-10-CM | POA: Diagnosis not present

## 2011-12-10 DIAGNOSIS — I10 Essential (primary) hypertension: Secondary | ICD-10-CM | POA: Diagnosis not present

## 2011-12-29 DIAGNOSIS — T466X5A Adverse effect of antihyperlipidemic and antiarteriosclerotic drugs, initial encounter: Secondary | ICD-10-CM | POA: Insufficient documentation

## 2012-01-30 DIAGNOSIS — Z01818 Encounter for other preprocedural examination: Secondary | ICD-10-CM | POA: Diagnosis not present

## 2012-01-30 DIAGNOSIS — D689 Coagulation defect, unspecified: Secondary | ICD-10-CM | POA: Diagnosis not present

## 2012-01-30 DIAGNOSIS — R5381 Other malaise: Secondary | ICD-10-CM | POA: Diagnosis not present

## 2012-01-30 DIAGNOSIS — R5383 Other fatigue: Secondary | ICD-10-CM | POA: Diagnosis not present

## 2012-01-30 DIAGNOSIS — I150 Renovascular hypertension: Secondary | ICD-10-CM | POA: Diagnosis not present

## 2012-01-30 DIAGNOSIS — I2510 Atherosclerotic heart disease of native coronary artery without angina pectoris: Secondary | ICD-10-CM | POA: Diagnosis not present

## 2012-01-30 DIAGNOSIS — R079 Chest pain, unspecified: Secondary | ICD-10-CM | POA: Diagnosis not present

## 2012-01-31 ENCOUNTER — Other Ambulatory Visit: Payer: Self-pay | Admitting: Cardiology

## 2012-02-03 ENCOUNTER — Ambulatory Visit (HOSPITAL_COMMUNITY)
Admission: RE | Admit: 2012-02-03 | Discharge: 2012-02-03 | Disposition: A | Discharge status: Home / Self Care | Payer: Medicare Other | Source: Ambulatory Visit | Attending: Cardiology | Admitting: Cardiology

## 2012-02-03 ENCOUNTER — Encounter (HOSPITAL_COMMUNITY)
Admission: RE | Disposition: A | Discharge status: Home / Self Care | Payer: Self-pay | Source: Ambulatory Visit | Attending: Cardiology

## 2012-02-03 DIAGNOSIS — I2510 Atherosclerotic heart disease of native coronary artery without angina pectoris: Secondary | ICD-10-CM | POA: Diagnosis not present

## 2012-02-03 DIAGNOSIS — R079 Chest pain, unspecified: Secondary | ICD-10-CM | POA: Diagnosis not present

## 2012-02-03 DIAGNOSIS — I10 Essential (primary) hypertension: Secondary | ICD-10-CM | POA: Diagnosis not present

## 2012-02-03 DIAGNOSIS — Z9861 Coronary angioplasty status: Secondary | ICD-10-CM | POA: Insufficient documentation

## 2012-02-03 DIAGNOSIS — R0602 Shortness of breath: Secondary | ICD-10-CM | POA: Diagnosis not present

## 2012-02-03 HISTORY — PX: LEFT HEART CATHETERIZATION WITH CORONARY ANGIOGRAM: SHX5451

## 2012-02-03 SURGERY — LEFT HEART CATHETERIZATION WITH CORONARY ANGIOGRAM
Anesthesia: LOCAL

## 2012-02-03 MED ORDER — FENTANYL CITRATE 0.05 MG/ML IJ SOLN
INTRAMUSCULAR | Status: AC
  Filled 2012-02-03: qty 2

## 2012-02-03 MED ORDER — DIAZEPAM 5 MG PO TABS
5.0000 mg | ORAL_TABLET | ORAL | Status: AC
  Administered 2012-02-03: 5 mg via ORAL
  Filled 2012-02-03: qty 1

## 2012-02-03 MED ORDER — ACETAMINOPHEN 325 MG PO TABS: 650.0000 mg | ORAL_TABLET | ORAL | Status: DC | PRN

## 2012-02-03 MED ORDER — SODIUM CHLORIDE 0.9 % IJ SOLN: 3.0000 mL | Freq: Two times a day (BID) | INTRAMUSCULAR | Status: DC

## 2012-02-03 MED ORDER — NITROGLYCERIN 0.2 MG/ML ON CALL CATH LAB
INTRAVENOUS | Status: AC
  Filled 2012-02-03: qty 1

## 2012-02-03 MED ORDER — LIDOCAINE HCL (PF) 1 % IJ SOLN
INTRAMUSCULAR | Status: AC
  Filled 2012-02-03: qty 30

## 2012-02-03 MED ORDER — HEPARIN (PORCINE) IN NACL 2-0.9 UNIT/ML-% IJ SOLN
INTRAMUSCULAR | Status: AC
  Filled 2012-02-03: qty 2000

## 2012-02-03 MED ORDER — DIPHENHYDRAMINE HCL 50 MG/ML IJ SOLN
INTRAMUSCULAR | Status: AC
  Filled 2012-02-03: qty 1

## 2012-02-03 MED ORDER — METHYLPREDNISOLONE SODIUM SUCC 125 MG IJ SOLR
INTRAMUSCULAR | Status: AC
  Filled 2012-02-03: qty 2

## 2012-02-03 MED ORDER — MIDAZOLAM HCL 2 MG/2ML IJ SOLN
INTRAMUSCULAR | Status: AC
  Filled 2012-02-03: qty 2

## 2012-02-03 MED ORDER — FAMOTIDINE IN NACL 20-0.9 MG/50ML-% IV SOLN
INTRAVENOUS | Status: AC
  Filled 2012-02-03: qty 50

## 2012-02-03 MED ORDER — SODIUM CHLORIDE 0.9 % IV SOLN: 1.0000 mL/kg/h | INTRAVENOUS | Status: DC

## 2012-02-03 NOTE — Discharge Instructions (Signed)
Radial Site Care Refer to this sheet in the next few weeks. These instructions provide you with information on caring for yourself after your procedure. Your caregiver may also give you more specific instructions. Your treatment has been planned according to current medical practices, but problems sometimes occur. Call your caregiver if you have any problems or questions after your procedure. HOME CARE INSTRUCTIONS  You may shower the day after the procedure.Remove the bandage (dressing) and gently wash the site with plain soap and water.Gently pat the site dry.   Do not apply powder or lotion to the site.   Do not submerge the affected site in water for 3 to 5 days.   Inspect the site at least twice daily.   Do not flex or bend the affected arm for 24 hours.   No lifting over 5 pounds (2.3 kg) for 5 days after your procedure.   Do not drive home if you are discharged the same day of the procedure. Have someone else drive you.   You may drive 24 hours after the procedure unless otherwise instructed by your caregiver.   Do not operate machinery or power tools for 24 hours.   A responsible adult should be with you for the first 24 hours after you arrive home.  What to expect:  Any bruising will usually fade within 1 to 2 weeks.   Blood that collects in the tissue (hematoma) may be painful to the touch. It should usually decrease in size and tenderness within 1 to 2 weeks.  SEEK IMMEDIATE MEDICAL CARE IF:  You have unusual pain at the radial site.   You have redness, warmth, swelling, or pain at the radial site.   You have drainage (other than a small amount of blood on the dressing).   You have chills.   You have a fever or persistent symptoms for more than 72 hours.   You have a fever and your symptoms suddenly get worse.   Your arm becomes pale, cool, tingly, or numb.   You have heavy bleeding from the site. Hold pressure on the site.  Document Released: 09/21/2010  Document Revised: 08/08/2011 Document Reviewed: 09/21/2010 [employer] Patient Information 2012 [employer]. 

## 2012-02-03 NOTE — H&P (Signed)
History and Physical Interval Note:  NAME:  [name]   MRN: [id number] DOB:  July 13, 1941   ADMIT DATE: 02/03/2012   02/03/2012 9:00 AM  [name] is a 71 y.o. female with a history of RCA & LAD BMS placed in Nov-2011, relook cath in July 2012 with DES to prox LAD stent & PTCA of jailed Diagonal.  She continues to have CP that she feels is anginal in nature, despite a negative Myoview ST.  She is hoping to have a second back Sgx in July, once Plavix can be discontinued.  She presents now for invasive coronary evaluation via cardiac catheterization. She also had moderate RAS with difficult to control HTN, we will also image her renal arteries to look for progression of disease.   SOCHx:  does not have a smoking history on file. She does not have any smokeless tobacco history on file. Her alcohol and drug histories not on file.  ALLERGIES: Allergies  Allergen Reactions  . Codeine Phosphate     REACTION: unspecified  . Iodine     REACTION: unspecified    HOME MEDICATIONS: Prescriptions prior to admission  Medication Sig Dispense Refill  . aspirin 325 MG tablet Take 325 mg by mouth daily.      . clopidogrel (PLAVIX) 75 MG tablet Take 75 mg by mouth daily.      . furosemide (LASIX) 40 MG tablet Take 40-80 mg by mouth daily. Pt takes 80 mg in the morning and 40 mg in the evening      . pantoprazole (PROTONIX) 40 MG tablet Take 40 mg by mouth daily.      . valsartan-hydrochlorothiazide (DIOVAN-HCT) 320-12.5 MG per tablet Take 1 tablet by mouth daily.        PHYSICAL EXAM:Blood pressure 142/69, pulse 96, temperature 98.1 F (36.7 C), temperature source Oral, resp. rate 18, height 5\' 8"  (1.727 m), weight 100.245 kg (221 lb), SpO2 95.00%. General appearance: alert, cooperative, appears stated age, no distress, moderately obese and pleasant mood & affect Neck: no adenopathy, no carotid bruit, no JVD, supple, symmetrical, trachea midline and thyroid not enlarged, symmetric, no  tenderness/mass/nodules Lungs: clear to auscultation bilaterally, normal percussion bilaterally and non-labored Heart: regular rate and rhythm, S1, S2 normal, S4 present and no M/R Abdomen: soft, non-tender; bowel sounds normal; no masses,  no organomegaly and obese Extremities: edema ~L > RLE ~1-2+, varicose veins noted and venous stasis dermatitis noted Pulses: 2+ and symmetric Neurologic: Grossly normal; CN II-XII grossly intact.  IMPRESSION & PLAN The patients' history has been reviewed, patient examined, no change in status from most recent note, stable for surgery. I have reviewed the patients' chart and labs. Questions were answered to the patient's satisfaction.    [name] has presented today for surgery, with the diagnosis of chest pain The various methods of treatment have been discussed with the patient and family.   Risks / Complications include, but not limited to: Death, MI, CVA/TIA, VF/VT (with defibrillation), Bradycardia (need for temporary pacer placement), contrast induced nephropathy, bleeding / bruising / hematoma / pseudoaneurysm, vascular or coronary injury (with possible emergent CT or Vascular Surgery), adverse medication reactions, infection.     After consideration of risks, benefits and other options for treatment, the patient has consented to Procedure(s):  LEFT HEART CATHETERIZATION AND CORONARY ANGIOGRAPHY +/- AD HOC PERCUTANEOUS CORONARY INTERVENTION   as a surgical intervention.   We will proceed with the planned procedure.   Edwen Mclester W [facility] [address] [place], [place]  [address]  [phone]  02/03/2012 9:00 AM

## 2012-02-03 NOTE — CV Procedure (Signed)
[facility] PERCUTANEOUS CORONARY INTERVENTION REPORT  NAME:  [name]   MRN: [id number] DOB:  [birth year]   ADMIT DATE: 02/03/2012  INTERVENTIONAL CARDIOLOGIST: Marykay Lex, M.D., MS PRIMARY CARE PROVIDER: No primary provider on file. PRIMARY CARDIOLOGIST: Marykay Lex, MD, MS   PATIENT:  [name] is a 71 y.o. female with a history of RCA & LAD BMS placed in Nov-2011, relook cath in July 2012 with DES to prox LAD stent & PTCA of jailed Diagonal. She continues to have CP that she feels is anginal in nature, despite a negative Myoview ST. She is hoping to have a second back Sgx in July, once Plavix can be discontinued. She presents now for invasive coronary evaluation via cardiac catheterization.  She also had moderate RAS with difficult to control HTN, we will also image her renal arteries to look for progression of disease.  PRE-OPERATIVE DIAGNOSIS:    Chest Pain and Shortness of Breath on Exertion  Known CAD - s/p 2 Vessel PCI  PROCEDURES PERFORMED:    Left Heart Catheterization with Coronary Angiography via 5Fr Right Radial Artery Access  Left Ventriculography  Abdominal Aortic Angiography  Consent: The procedure with Risks/Benefits/Alternatives and Indications was reviewed with the patient.  All questions were answered.    Risks / Complications include, but not limited to: Death, MI, CVA/TIA, VF/VT (with defibrillation), Bradycardia (need for temporary pacer placement), contrast induced nephropathy, bleeding / bruising / hematoma / pseudoaneurysm, vascular or coronary injury (with possible emergent CT or Vascular Surgery), adverse medication reactions, infection.    The patient voiced understanding and agree to proceed.    Consent for signed by MD and patient with RN witness -- placed on chart.  Procedure: The patient was brought to the 2nd Floor [facility] in the fasting state and prepped and draped in the  usual sterile fashion for Right groin access. Sterile technique was used including antiseptics, cap, gloves, gown, hand hygiene, mask and sheet.  Skin prep: Chlorhexidine;  Time Out: Verified patient identification, verified procedure, site/side was marked, verified correct patient position, special equipment/implants available, medications/allergies/relevent history reviewed, required imaging and test results available.  Performed  The right wrist was anesthetized with 1% subcutaneous Lidocaine.  The right radial artery was accessed using the Seldinger Technique with placement of a 5 Fr Glide Sheath. The sheath was aspirated and flushed.  Then a total of 10 ml of standard Radial Artery Cocktail (see medications) was infused.  Radial Cocktail: 2.5 mg Nicardipine, 400 mcg NTG, 2 ml 2% Lidocaine  A 5 Fr TIG 4.0 Catheter was advanced of over a Versicore wire into the ascending Aorta and used to engage the Left then Right Coronary Arteries.  Multiple cineangiographic views of the Both coronary artery system(s) were performed.   This catheter was then exchanged over the Long Exchange Safety J wire for an angled Pigtail catheter that was advanced across the Aortic Valve.  LV hemodynamics were measured, and Left Ventriculography was performed.  LV hemodynamics were then re-sampled, and the catheter was pulled back across the Aortic Valve for measurement of "pull-back" gradient.  The catheter wa sthen re-directed into the descending abdominal aorta over the wire (actually using the TIG catheter to re-direct the wire).   Abdominal Aortic Angiography was performed.  Then, the catheter and wire were removed completely out of the body.  The sheath was removed in the Cath Lab with a TR band placed at 11 ml Air at 1002  hrs.  Reverse Allen's test revealed non-occlusive hemostasis.  Hemodynamics:  Central Aortic / Mean Pressures: 116/52 mmHg; 72 mmHg  Left Ventricular Pressures / EDP: 121/14 mmHg; 20 mmHg  Left  Ventriculography:  EF: 55% %  Wall Motion: Normal  Coronary Anatomy:  Left Main: Large Caliber, bifurcates into LAD & Left Circumflex; angiographically normal  LAD: Large Caliber vessel, proximal ~20% focal lesion at SP1.  The mid-LAD stented segment is widely patent, and the remainder of the vessel is tortuous, but free of any angiographically significant disease. D1: Small caliber vessel at the proximal edge of the stent; minimal luminal irregularities D2: Moderate caliber vessel, jailed by the stent; minor 10-20% ostial stenosis that appears to be similar to the post-PTCA stenosis from July 2012. The vessel then back into to small-caliber branches cover a large distribution of the lateral wall; angiographically normal   Left Circumflex: Large caliber vessel gives rise to a proximal atrial branch and small moderate caliber OM1 before bifurcating into the AV groove branch and a moderate to large caliber OM 2.  OM1: Small-caliber vessel; angiographically normal  OM 2: Moderate to large caliber vessel; reaches down to the apex along the lateral wall, angiographic normal AV Groove Circumflex: Moderate caliber vessel bifurcates into posterior lateral branches; angiographically normal   RCA:  Large-Caliber, dominant vessel with a proximal stent is widely patent preceded by a roughly 10-20% focal step up lesion.  The remainder the vessel is tortuous but free of any significant disease gives rise to several RV marginal branches the  RPDA:  moderate caliber vessel reaches also the apex several septal perforators; angiographically normal   RPL Sysytem:  the Right Posterior Atrio-Ventricular Groove branch gives rise to several small posterior lateral branches. Angiographically normal.   Abdominal Aorta: Normal caliber vessel; minimal luminal irregularities, but no ectasia or stenosis.  Right Renal Artery: actually 2 moderate sized arteries with ~40-50% stenosis in the inferior artery  Left Renal  Artery: stable ~50-60% eccentric stenosis in a large caliber vessel; does not appear to be changed from previous study.  ANESTHESIA:   Local Lidocaine  2 ml SEDATION:   Premedication: 125 mg Solu-Medrol IV, 20 mg IV famotidine, 25 mg IV Benadryl -- for reported contrast allergy   25 mg IV Versed,  25  mcg IV fentanyl ;  MEDICATIONS: Radial Cocktail: 2.5 mg Nicardipine, 400 mcg NTG, 2 ml 2% Lidocaine  IV Heparin: 5000 units  EBL:   < 10 ml No complications. The patient stable before during and after the procedure.  PATIENT DISPOSITION:    Hemodynamically stable, chest pain-free   Radial TR band in place with adequate nonocclusive hemostasis.   POST-OPERATIVE DIAGNOSIS:    Widely patent LAD and RCA stents as well as the ostium of D2 that was treated with rescue angioplasty at the time of DES stent placement in July 2012.   Moderate Left Renal Artery Stenosis of questionable physiologic significance.  Preserved Left Ventricular Ejection with only mildly elevated EDP.   PLAN OF CARE:  Standard post-radial cath care with plan for discharge later today.   Will administer one more dose of oral prednisone prior to discharge.  I'll see her in followup clinic, and plan to schedule MET/PFT testing earlier this month.  There is no major coronary artery disease to preclude her from proceeding with her plan back surgery in July or August.    Marykay Lex, M.D., M.S. [facility] [address] [place], [place]  [address]  [phone]  02/03/2012 6:23 PM

## 2012-02-04 MED FILL — Nicardipine HCl IV Soln 2.5 MG/ML: INTRAVENOUS | Qty: 1 | Status: AC

## 2012-02-06 ENCOUNTER — Telehealth: Payer: Self-pay | Admitting: Gastroenterology

## 2012-02-06 ENCOUNTER — Ambulatory Visit (INDEPENDENT_AMBULATORY_CARE_PROVIDER_SITE_OTHER): Payer: Medicare Other | Admitting: Gastroenterology

## 2012-02-06 ENCOUNTER — Encounter: Payer: Self-pay | Admitting: Gastroenterology

## 2012-02-06 VITALS — BP 148/70 | HR 84 | Ht 68.0 in | Wt 235.5 lb

## 2012-02-06 DIAGNOSIS — K861 Other chronic pancreatitis: Secondary | ICD-10-CM | POA: Diagnosis not present

## 2012-02-06 DIAGNOSIS — K589 Irritable bowel syndrome without diarrhea: Secondary | ICD-10-CM | POA: Diagnosis not present

## 2012-02-06 DIAGNOSIS — Z7901 Long term (current) use of anticoagulants: Secondary | ICD-10-CM

## 2012-02-06 DIAGNOSIS — R1310 Dysphagia, unspecified: Secondary | ICD-10-CM

## 2012-02-06 DIAGNOSIS — K219 Gastro-esophageal reflux disease without esophagitis: Secondary | ICD-10-CM

## 2012-02-06 DIAGNOSIS — I2510 Atherosclerotic heart disease of native coronary artery without angina pectoris: Secondary | ICD-10-CM

## 2012-02-06 DIAGNOSIS — I7090 Unspecified atherosclerosis: Secondary | ICD-10-CM

## 2012-02-06 MED ORDER — PANCRELIPASE (LIP-PROT-AMYL) 20000-68000 UNITS PO CPEP: 2.0000 | ORAL_CAPSULE | Freq: Three times a day (TID) | ORAL | Status: DC

## 2012-02-06 MED ORDER — DEXLANSOPRAZOLE 60 MG PO CPDR: 60.0000 mg | DELAYED_RELEASE_CAPSULE | Freq: Every day | ORAL | Status: DC

## 2012-02-06 MED ORDER — ESOMEPRAZOLE MAGNESIUM 40 MG PO CPDR: 40.0000 mg | DELAYED_RELEASE_CAPSULE | Freq: Every day | ORAL | Status: DC

## 2012-02-06 NOTE — Telephone Encounter (Signed)
rx for Nexium sent since Dexilant was not covered

## 2012-02-06 NOTE — Patient Instructions (Signed)
Restart Zenpep two tablets three times a day with meals. Change from Protonix to Dexilant one tablet by mouth once a day.  Call back in July to arrange your Endoscopy.  Your prescription(s) have been sent to you pharmacy, Zenpep, Dexilant

## 2012-02-06 NOTE — Progress Notes (Signed)
History of Present Illness: This is a Ms. multiple GI problems and multiple GI evaluations. She has chronic GERD and is on regular PPI therapy, and has had serial endoscopic dilations, last performed 3 years ago. She now has worsening acid reflux and intermittent solid food dysphagia. She also has suspected chronic idiopathic pancreatitis, but has discontinued her pancreatic extracts for reasons which are unclear. Subsequentially she has gas and bloating but no dominant pain. She has multiple cardiovascular and peripheral vascular problems with multiple stents, and is on Plavix 75 mg a day. She has planned back surgery with Dr. Noel Gerold in July, and apparently will be off of Plavix for 7 days at that time. Her cardiologist is Dr. Bryan Lemma.    Current Medications, Allergies, Past Medical History, Past Surgical History, Family History and Social History were reviewed in [employer] record.   Assessment and plan: Chronic GERD and probable recurrent esophageal stricture. I have changed her to Dexilant 60 mg every morning with standard anti-reflux maneuvers. Also have restarted Zen-Pep 2 tablets 3 times a day, pancreatic extracts. We will schedule her endoscopy and dilation and she is off anticoagulation therapy. She is up-to-date on her colonoscopy exam which the past showing diverticulosis but no colon polyps. No diagnosis found.

## 2012-02-12 DIAGNOSIS — M545 Low back pain: Secondary | ICD-10-CM | POA: Diagnosis not present

## 2012-02-19 DIAGNOSIS — I10 Essential (primary) hypertension: Secondary | ICD-10-CM | POA: Diagnosis not present

## 2012-02-19 DIAGNOSIS — I2510 Atherosclerotic heart disease of native coronary artery without angina pectoris: Secondary | ICD-10-CM | POA: Diagnosis not present

## 2012-02-19 DIAGNOSIS — R609 Edema, unspecified: Secondary | ICD-10-CM | POA: Diagnosis not present

## 2012-02-19 DIAGNOSIS — R0602 Shortness of breath: Secondary | ICD-10-CM | POA: Diagnosis not present

## 2012-02-28 DIAGNOSIS — E1149 Type 2 diabetes mellitus with other diabetic neurological complication: Secondary | ICD-10-CM | POA: Diagnosis not present

## 2012-02-28 DIAGNOSIS — R809 Proteinuria, unspecified: Secondary | ICD-10-CM | POA: Diagnosis not present

## 2012-02-28 DIAGNOSIS — K089 Disorder of teeth and supporting structures, unspecified: Secondary | ICD-10-CM | POA: Diagnosis not present

## 2012-02-28 DIAGNOSIS — E1142 Type 2 diabetes mellitus with diabetic polyneuropathy: Secondary | ICD-10-CM | POA: Diagnosis not present

## 2012-02-28 DIAGNOSIS — E1129 Type 2 diabetes mellitus with other diabetic kidney complication: Secondary | ICD-10-CM | POA: Diagnosis not present

## 2012-02-28 DIAGNOSIS — I10 Essential (primary) hypertension: Secondary | ICD-10-CM | POA: Diagnosis not present

## 2012-02-28 DIAGNOSIS — E669 Obesity, unspecified: Secondary | ICD-10-CM | POA: Diagnosis not present

## 2012-02-28 DIAGNOSIS — E785 Hyperlipidemia, unspecified: Secondary | ICD-10-CM | POA: Diagnosis not present

## 2012-03-02 HISTORY — PX: OTHER SURGICAL HISTORY: SHX169

## 2012-03-03 ENCOUNTER — Encounter: Payer: Self-pay | Admitting: Gastroenterology

## 2012-03-19 DIAGNOSIS — E785 Hyperlipidemia, unspecified: Secondary | ICD-10-CM | POA: Diagnosis not present

## 2012-03-19 DIAGNOSIS — R809 Proteinuria, unspecified: Secondary | ICD-10-CM | POA: Diagnosis not present

## 2012-03-19 DIAGNOSIS — E538 Deficiency of other specified B group vitamins: Secondary | ICD-10-CM | POA: Diagnosis not present

## 2012-03-19 DIAGNOSIS — K089 Disorder of teeth and supporting structures, unspecified: Secondary | ICD-10-CM | POA: Diagnosis not present

## 2012-03-19 DIAGNOSIS — I10 Essential (primary) hypertension: Secondary | ICD-10-CM | POA: Diagnosis not present

## 2012-03-19 DIAGNOSIS — E1129 Type 2 diabetes mellitus with other diabetic kidney complication: Secondary | ICD-10-CM | POA: Diagnosis not present

## 2012-03-22 DIAGNOSIS — E876 Hypokalemia: Secondary | ICD-10-CM | POA: Diagnosis not present

## 2012-03-24 DIAGNOSIS — Z13820 Encounter for screening for osteoporosis: Secondary | ICD-10-CM | POA: Diagnosis not present

## 2012-03-24 DIAGNOSIS — E876 Hypokalemia: Secondary | ICD-10-CM | POA: Diagnosis not present

## 2012-03-24 DIAGNOSIS — Z1231 Encounter for screening mammogram for malignant neoplasm of breast: Secondary | ICD-10-CM | POA: Diagnosis not present

## 2012-03-27 DIAGNOSIS — E876 Hypokalemia: Secondary | ICD-10-CM | POA: Diagnosis not present

## 2012-04-02 ENCOUNTER — Ambulatory Visit (AMBULATORY_SURGERY_CENTER): Payer: Medicare Other | Admitting: *Deleted

## 2012-04-02 VITALS — Ht 68.0 in | Wt 228.0 lb

## 2012-04-02 DIAGNOSIS — R1310 Dysphagia, unspecified: Secondary | ICD-10-CM

## 2012-04-02 DIAGNOSIS — I219 Acute myocardial infarction, unspecified: Secondary | ICD-10-CM | POA: Insufficient documentation

## 2012-04-02 DIAGNOSIS — Z9289 Personal history of other medical treatment: Secondary | ICD-10-CM

## 2012-04-02 DIAGNOSIS — I214 Non-ST elevation (NSTEMI) myocardial infarction: Secondary | ICD-10-CM

## 2012-04-02 HISTORY — DX: Personal history of other medical treatment: Z92.89

## 2012-04-02 HISTORY — DX: Non-ST elevation (NSTEMI) myocardial infarction: I21.4

## 2012-04-02 NOTE — Progress Notes (Signed)
EF=55%

## 2012-04-08 ENCOUNTER — Ambulatory Visit (AMBULATORY_SURGERY_CENTER): Payer: Medicare Other | Admitting: Gastroenterology

## 2012-04-08 ENCOUNTER — Encounter: Payer: Self-pay | Admitting: Gastroenterology

## 2012-04-08 VITALS — BP 149/64 | HR 79 | Temp 98.0°F | Resp 17 | Ht 68.0 in | Wt 228.0 lb

## 2012-04-08 DIAGNOSIS — R1310 Dysphagia, unspecified: Secondary | ICD-10-CM | POA: Diagnosis not present

## 2012-04-08 DIAGNOSIS — K2940 Chronic atrophic gastritis without bleeding: Secondary | ICD-10-CM | POA: Diagnosis not present

## 2012-04-08 DIAGNOSIS — K222 Esophageal obstruction: Secondary | ICD-10-CM | POA: Diagnosis not present

## 2012-04-08 DIAGNOSIS — K219 Gastro-esophageal reflux disease without esophagitis: Secondary | ICD-10-CM

## 2012-04-08 DIAGNOSIS — E079 Disorder of thyroid, unspecified: Secondary | ICD-10-CM | POA: Diagnosis not present

## 2012-04-08 DIAGNOSIS — K2950 Unspecified chronic gastritis without bleeding: Secondary | ICD-10-CM

## 2012-04-08 HISTORY — PX: ESOPHAGOGASTRODUODENOSCOPY: SHX1529

## 2012-04-08 MED ORDER — SODIUM CHLORIDE 0.9 % IV SOLN: 500.0000 mL | INTRAVENOUS | Status: DC

## 2012-04-08 NOTE — Patient Instructions (Addendum)
Findings:  Gastritis, Hiatal Hernia,  Recommendations:  Clear liquids until 3:00 PM and then soft diet for rest of day, continue PPI  YOU HAD AN ENDOSCOPIC PROCEDURE TODAY AT THE [facility]: Refer to the procedure report that was given to you for any specific questions about what was found during the examination.  If the procedure report does not answer your questions, please call your gastroenterologist to clarify.  If you requested that your care partner not be given the details of your procedure findings, then the procedure report has been included in a sealed envelope for you to review at your convenience later.  YOU SHOULD EXPECT: Some feelings of bloating in the abdomen. Passage of more gas than usual.  Walking can help get rid of the air that was put into your GI tract during the procedure and reduce the bloating. If you had a lower endoscopy (such as a colonoscopy or flexible sigmoidoscopy) you may notice spotting of blood in your stool or on the toilet paper. If you underwent a bowel prep for your procedure, then you may not have a normal bowel movement for a few days.  DIET: Your first meal following the procedure should be a light meal and then it is ok to progress to your normal diet.  A half-sandwich or bowl of soup is an example of a good first meal.  Heavy or fried foods are harder to digest and may make you feel nauseous or bloated.  Likewise meals heavy in dairy and vegetables can cause extra gas to form and this can also increase the bloating.  Drink plenty of fluids but you should avoid alcoholic beverages for 24 hours.  ACTIVITY: Your care partner should take you home directly after the procedure.  You should plan to take it easy, moving slowly for the rest of the day.  You can resume normal activity the day after the procedure however you should NOT DRIVE or use heavy machinery for 24 hours (because of the sedation medicines used during the test).    SYMPTOMS TO REPORT  IMMEDIATELY: A gastroenterologist can be reached at any hour.  During normal business hours, 8:30 AM to 5:00 PM Monday through Friday, call [phone].  After hours and on weekends, please call the GI answering service at [phone] who will take a message and have the physician on call contact you.   Following lower endoscopy (colonoscopy or flexible sigmoidoscopy):  Excessive amounts of blood in the stool  Significant tenderness or worsening of abdominal pains  Swelling of the abdomen that is new, acute  Fever of 100F or higher  Following upper endoscopy (EGD)  Vomiting of blood or coffee ground material  New chest pain or pain under the shoulder blades  Painful or persistently difficult swallowing  New shortness of breath  Fever of 100F or higher  Black, tarry-looking stools  FOLLOW UP: If any biopsies were taken you will be contacted by phone or by letter within the next 1-3 weeks.  Call your gastroenterologist if you have not heard about the biopsies in 3 weeks.  Our staff will call the home number listed on your records the next business day following your procedure to check on you and address any questions or concerns that you may have at that time regarding the information given to you following your procedure. This is a courtesy call and so if there is no answer at the home number and we have not heard from you through the  emergency physician on call, we will assume that you have returned to your regular daily activities without incident.  SIGNATURES/CONFIDENTIALITY: You and/or your care partner have signed paperwork which will be entered into your electronic medical record.  These signatures attest to the fact that that the information above on your After Visit Summary has been reviewed and is understood.  Full responsibility of the confidentiality of this discharge information lies with you and/or your care-partner.   Please follow all discharge instructions given to you  by the recovery room nurse. If you have any questions or problems after discharge please call one of the numbers listed above. You will receive a phone call in the am to see how you are doing and answer any questions you may have. Thank you for choosing [facility] for your health care needs.

## 2012-04-08 NOTE — Progress Notes (Signed)
Patient did not experience any of the following events: a burn prior to discharge; a fall within the facility; wrong site/side/patient/procedure/implant event; or a hospital transfer or hospital admission upon discharge from the facility. (G8907) Patient did not have preoperative order for IV antibiotic SSI prophylaxis. (G8918)  

## 2012-04-08 NOTE — Op Note (Signed)
[facility] [address] [place], [place]  [address]  ENDOSCOPY PROCEDURE REPORT  PATIENT:  Tricia, Herring  MR#:  [id number] BIRTHDATE:  1941-01-31, 71 yrs. old  GENDER:  female  ENDOSCOPIST:  Vania Rea. Jarold Motto, MD, [facility] Referred by:  PROCEDURE DATE:  04/08/2012 PROCEDURE:  EGD with biopsy, 43239, Maloney Dilation of Esophagus ASA CLASS:  Class III INDICATIONS:  GERD, dyspepsia  MEDICATIONS:   propofol (Diprivan) 150 mg IV TOPICAL ANESTHETIC:  DESCRIPTION OF PROCEDURE:   After the risks and benefits of the procedure were explained, informed consent was obtained.  The LB GIF-H180 [id number] endoscope was introduced through the mouth and advanced to the second portion of the duodenum.  The instrument was slowly withdrawn as the mucosa was fully examined. <<PROCEDUREIMAGES>>  Moderate gastritis was found in the body and the antrum of the stomach. marked erythema and PYLORIC STENOSIS AND DILATED WITH SCOPE.CLO BX. DONE  A hiatal hernia was found. 4-5 CM. HH NOTED.DILATED WITH 356F MALONEY DILATOR  Normal duodenal folds were noted.  The esophagus and gastroesophageal junction were completely normal in appearance.    Retroflexed views revealed a hiatal hernia.    The scope was then withdrawn from the patient and the procedure completed.  COMPLICATIONS:  None  ENDOSCOPIC IMPRESSION: 1) Moderate gastritis in the body and the antrum of the stomach  2) Hiatal hernia 3) Normal duodenal folds 4) Normal esophagus 5) A hiatal hernia RECOMMENDATIONS: 1) Rx CLO if positive 2) Clear liquids until, then soft foods rest iof day. Resume prior diet tomorrow. 3) continue PPI  ______________________________ Vania Rea. Jarold Motto, MD, [name]  CC:  n. eSIGNED:   Vania Rea. Patterson at 04/08/2012 01:53 PM  Leslee Home, [phone]

## 2012-04-08 NOTE — Progress Notes (Signed)
PROPOFOL PER S CAMP CRNA, SEE SCANNED INTRA PROCEDURE REPORT. ALL MEDICINES TITRATED PER CRNA. EWM 

## 2012-04-09 ENCOUNTER — Other Ambulatory Visit: Payer: Self-pay | Admitting: Physical Medicine and Rehabilitation

## 2012-04-09 ENCOUNTER — Telehealth: Payer: Self-pay | Admitting: *Deleted

## 2012-04-09 NOTE — Telephone Encounter (Signed)
  Follow up Call-  Call back number 04/08/2012  Post procedure Call Back phone  # [phone]  Permission to leave phone message Yes     Patient questions:  Do you have a fever, pain , or abdominal swelling? no Pain Score  0 *  Have you tolerated food without any problems? no  Have you been able to return to your normal activities? yes  Do you have any questions about your discharge instructions: Diet   no Medications  no Follow up visit  no  Do you have questions or concerns about your Care? no  Actions: * If pain score  N

## 2012-04-10 ENCOUNTER — Encounter: Payer: Medicare Other | Admitting: Gastroenterology

## 2012-04-10 DIAGNOSIS — Z5189 Encounter for other specified aftercare: Secondary | ICD-10-CM | POA: Diagnosis not present

## 2012-04-10 DIAGNOSIS — Z800 Family history of malignant neoplasm of digestive organs: Secondary | ICD-10-CM | POA: Diagnosis not present

## 2012-04-10 DIAGNOSIS — I10 Essential (primary) hypertension: Secondary | ICD-10-CM | POA: Diagnosis not present

## 2012-04-10 DIAGNOSIS — R05 Cough: Secondary | ICD-10-CM | POA: Diagnosis not present

## 2012-04-10 DIAGNOSIS — R5381 Other malaise: Secondary | ICD-10-CM | POA: Diagnosis not present

## 2012-04-10 DIAGNOSIS — Z823 Family history of stroke: Secondary | ICD-10-CM | POA: Diagnosis not present

## 2012-04-10 DIAGNOSIS — K560 Paralytic ileus: Secondary | ICD-10-CM | POA: Diagnosis not present

## 2012-04-10 DIAGNOSIS — I519 Heart disease, unspecified: Secondary | ICD-10-CM | POA: Diagnosis not present

## 2012-04-10 DIAGNOSIS — N390 Urinary tract infection, site not specified: Secondary | ICD-10-CM | POA: Diagnosis not present

## 2012-04-10 DIAGNOSIS — I700 Atherosclerosis of aorta: Secondary | ICD-10-CM | POA: Diagnosis not present

## 2012-04-10 DIAGNOSIS — M4130 Thoracogenic scoliosis, site unspecified: Secondary | ICD-10-CM | POA: Diagnosis not present

## 2012-04-10 DIAGNOSIS — N179 Acute kidney failure, unspecified: Secondary | ICD-10-CM | POA: Diagnosis not present

## 2012-04-10 DIAGNOSIS — N178 Other acute kidney failure: Secondary | ICD-10-CM | POA: Diagnosis not present

## 2012-04-10 DIAGNOSIS — D649 Anemia, unspecified: Secondary | ICD-10-CM | POA: Diagnosis not present

## 2012-04-10 DIAGNOSIS — I214 Non-ST elevation (NSTEMI) myocardial infarction: Secondary | ICD-10-CM | POA: Diagnosis not present

## 2012-04-10 DIAGNOSIS — Z833 Family history of diabetes mellitus: Secondary | ICD-10-CM | POA: Diagnosis not present

## 2012-04-10 DIAGNOSIS — M5126 Other intervertebral disc displacement, lumbar region: Secondary | ICD-10-CM | POA: Diagnosis not present

## 2012-04-10 DIAGNOSIS — M48061 Spinal stenosis, lumbar region without neurogenic claudication: Secondary | ICD-10-CM | POA: Diagnosis present

## 2012-04-10 DIAGNOSIS — K219 Gastro-esophageal reflux disease without esophagitis: Secondary | ICD-10-CM | POA: Diagnosis present

## 2012-04-10 DIAGNOSIS — R578 Other shock: Secondary | ICD-10-CM | POA: Diagnosis not present

## 2012-04-10 DIAGNOSIS — R0602 Shortness of breath: Secondary | ICD-10-CM | POA: Diagnosis not present

## 2012-04-10 DIAGNOSIS — IMO0002 Reserved for concepts with insufficient information to code with codable children: Secondary | ICD-10-CM | POA: Diagnosis not present

## 2012-04-10 DIAGNOSIS — E876 Hypokalemia: Secondary | ICD-10-CM | POA: Diagnosis not present

## 2012-04-10 DIAGNOSIS — M4716 Other spondylosis with myelopathy, lumbar region: Secondary | ICD-10-CM | POA: Diagnosis not present

## 2012-04-10 DIAGNOSIS — Z6835 Body mass index (BMI) 35.0-35.9, adult: Secondary | ICD-10-CM | POA: Diagnosis not present

## 2012-04-10 DIAGNOSIS — I959 Hypotension, unspecified: Secondary | ICD-10-CM | POA: Diagnosis not present

## 2012-04-10 DIAGNOSIS — E871 Hypo-osmolality and hyponatremia: Secondary | ICD-10-CM | POA: Diagnosis not present

## 2012-04-10 DIAGNOSIS — E873 Alkalosis: Secondary | ICD-10-CM | POA: Diagnosis not present

## 2012-04-10 DIAGNOSIS — M4120 Other idiopathic scoliosis, site unspecified: Secondary | ICD-10-CM | POA: Diagnosis not present

## 2012-04-10 DIAGNOSIS — J189 Pneumonia, unspecified organism: Secondary | ICD-10-CM | POA: Diagnosis not present

## 2012-04-10 DIAGNOSIS — M5106 Intervertebral disc disorders with myelopathy, lumbar region: Secondary | ICD-10-CM | POA: Diagnosis not present

## 2012-04-10 DIAGNOSIS — M519 Unspecified thoracic, thoracolumbar and lumbosacral intervertebral disc disorder: Secondary | ICD-10-CM | POA: Diagnosis not present

## 2012-04-10 DIAGNOSIS — Z981 Arthrodesis status: Secondary | ICD-10-CM | POA: Diagnosis not present

## 2012-04-10 DIAGNOSIS — R918 Other nonspecific abnormal finding of lung field: Secondary | ICD-10-CM | POA: Diagnosis not present

## 2012-04-10 DIAGNOSIS — M961 Postlaminectomy syndrome, not elsewhere classified: Secondary | ICD-10-CM | POA: Diagnosis present

## 2012-04-10 DIAGNOSIS — I2510 Atherosclerotic heart disease of native coronary artery without angina pectoris: Secondary | ICD-10-CM | POA: Diagnosis not present

## 2012-04-10 DIAGNOSIS — E119 Type 2 diabetes mellitus without complications: Secondary | ICD-10-CM | POA: Diagnosis not present

## 2012-04-10 DIAGNOSIS — E785 Hyperlipidemia, unspecified: Secondary | ICD-10-CM | POA: Diagnosis not present

## 2012-04-10 DIAGNOSIS — M4714 Other spondylosis with myelopathy, thoracic region: Secondary | ICD-10-CM | POA: Diagnosis not present

## 2012-04-10 DIAGNOSIS — M545 Low back pain: Secondary | ICD-10-CM | POA: Diagnosis not present

## 2012-04-10 DIAGNOSIS — I9589 Other hypotension: Secondary | ICD-10-CM | POA: Diagnosis not present

## 2012-04-10 DIAGNOSIS — D62 Acute posthemorrhagic anemia: Secondary | ICD-10-CM | POA: Diagnosis not present

## 2012-04-10 DIAGNOSIS — Z8249 Family history of ischemic heart disease and other diseases of the circulatory system: Secondary | ICD-10-CM | POA: Diagnosis not present

## 2012-04-10 DIAGNOSIS — Z09 Encounter for follow-up examination after completed treatment for conditions other than malignant neoplasm: Secondary | ICD-10-CM | POA: Diagnosis not present

## 2012-04-10 DIAGNOSIS — I509 Heart failure, unspecified: Secondary | ICD-10-CM | POA: Diagnosis present

## 2012-04-10 DIAGNOSIS — R1110 Vomiting, unspecified: Secondary | ICD-10-CM | POA: Diagnosis not present

## 2012-04-10 DIAGNOSIS — I517 Cardiomegaly: Secondary | ICD-10-CM | POA: Diagnosis not present

## 2012-04-10 DIAGNOSIS — E669 Obesity, unspecified: Secondary | ICD-10-CM | POA: Diagnosis present

## 2012-04-15 ENCOUNTER — Encounter: Payer: Self-pay | Admitting: Gastroenterology

## 2012-04-17 DIAGNOSIS — R5381 Other malaise: Secondary | ICD-10-CM | POA: Diagnosis not present

## 2012-04-17 DIAGNOSIS — Z7982 Long term (current) use of aspirin: Secondary | ICD-10-CM | POA: Diagnosis not present

## 2012-04-17 DIAGNOSIS — Z5189 Encounter for other specified aftercare: Secondary | ICD-10-CM | POA: Diagnosis not present

## 2012-04-17 DIAGNOSIS — I214 Non-ST elevation (NSTEMI) myocardial infarction: Secondary | ICD-10-CM | POA: Diagnosis not present

## 2012-04-17 DIAGNOSIS — G8929 Other chronic pain: Secondary | ICD-10-CM | POA: Diagnosis not present

## 2012-04-17 DIAGNOSIS — I10 Essential (primary) hypertension: Secondary | ICD-10-CM | POA: Diagnosis not present

## 2012-04-17 DIAGNOSIS — M4714 Other spondylosis with myelopathy, thoracic region: Secondary | ICD-10-CM | POA: Diagnosis not present

## 2012-04-17 DIAGNOSIS — M4130 Thoracogenic scoliosis, site unspecified: Secondary | ICD-10-CM | POA: Diagnosis not present

## 2012-04-17 DIAGNOSIS — Z981 Arthrodesis status: Secondary | ICD-10-CM | POA: Diagnosis not present

## 2012-04-17 DIAGNOSIS — E669 Obesity, unspecified: Secondary | ICD-10-CM | POA: Diagnosis not present

## 2012-04-17 DIAGNOSIS — K219 Gastro-esophageal reflux disease without esophagitis: Secondary | ICD-10-CM | POA: Diagnosis not present

## 2012-04-17 DIAGNOSIS — R197 Diarrhea, unspecified: Secondary | ICD-10-CM | POA: Diagnosis not present

## 2012-04-17 DIAGNOSIS — E1149 Type 2 diabetes mellitus with other diabetic neurological complication: Secondary | ICD-10-CM | POA: Diagnosis not present

## 2012-04-17 DIAGNOSIS — Z6835 Body mass index (BMI) 35.0-35.9, adult: Secondary | ICD-10-CM | POA: Diagnosis not present

## 2012-04-17 DIAGNOSIS — E873 Alkalosis: Secondary | ICD-10-CM | POA: Diagnosis not present

## 2012-04-17 DIAGNOSIS — Z7902 Long term (current) use of antithrombotics/antiplatelets: Secondary | ICD-10-CM | POA: Diagnosis not present

## 2012-04-17 DIAGNOSIS — I509 Heart failure, unspecified: Secondary | ICD-10-CM | POA: Diagnosis not present

## 2012-04-17 DIAGNOSIS — E785 Hyperlipidemia, unspecified: Secondary | ICD-10-CM | POA: Diagnosis not present

## 2012-04-17 DIAGNOSIS — Z8249 Family history of ischemic heart disease and other diseases of the circulatory system: Secondary | ICD-10-CM | POA: Diagnosis not present

## 2012-04-17 DIAGNOSIS — E1142 Type 2 diabetes mellitus with diabetic polyneuropathy: Secondary | ICD-10-CM | POA: Diagnosis not present

## 2012-04-17 DIAGNOSIS — Z823 Family history of stroke: Secondary | ICD-10-CM | POA: Diagnosis not present

## 2012-04-17 DIAGNOSIS — I2510 Atherosclerotic heart disease of native coronary artery without angina pectoris: Secondary | ICD-10-CM | POA: Diagnosis not present

## 2012-04-28 DIAGNOSIS — M961 Postlaminectomy syndrome, not elsewhere classified: Secondary | ICD-10-CM | POA: Diagnosis not present

## 2012-04-28 DIAGNOSIS — M4120 Other idiopathic scoliosis, site unspecified: Secondary | ICD-10-CM | POA: Diagnosis not present

## 2012-04-30 DIAGNOSIS — I509 Heart failure, unspecified: Secondary | ICD-10-CM | POA: Diagnosis not present

## 2012-04-30 DIAGNOSIS — Z4789 Encounter for other orthopedic aftercare: Secondary | ICD-10-CM | POA: Diagnosis not present

## 2012-04-30 DIAGNOSIS — I214 Non-ST elevation (NSTEMI) myocardial infarction: Secondary | ICD-10-CM | POA: Diagnosis not present

## 2012-04-30 DIAGNOSIS — R269 Unspecified abnormalities of gait and mobility: Secondary | ICD-10-CM | POA: Diagnosis not present

## 2012-04-30 DIAGNOSIS — E1149 Type 2 diabetes mellitus with other diabetic neurological complication: Secondary | ICD-10-CM | POA: Diagnosis not present

## 2012-05-05 DIAGNOSIS — I214 Non-ST elevation (NSTEMI) myocardial infarction: Secondary | ICD-10-CM | POA: Diagnosis not present

## 2012-05-05 DIAGNOSIS — Z4789 Encounter for other orthopedic aftercare: Secondary | ICD-10-CM | POA: Diagnosis not present

## 2012-05-05 DIAGNOSIS — I509 Heart failure, unspecified: Secondary | ICD-10-CM | POA: Diagnosis not present

## 2012-05-05 DIAGNOSIS — E1149 Type 2 diabetes mellitus with other diabetic neurological complication: Secondary | ICD-10-CM | POA: Diagnosis not present

## 2012-05-05 DIAGNOSIS — R269 Unspecified abnormalities of gait and mobility: Secondary | ICD-10-CM | POA: Diagnosis not present

## 2012-05-07 DIAGNOSIS — M48061 Spinal stenosis, lumbar region without neurogenic claudication: Secondary | ICD-10-CM | POA: Diagnosis not present

## 2012-05-07 DIAGNOSIS — M961 Postlaminectomy syndrome, not elsewhere classified: Secondary | ICD-10-CM | POA: Diagnosis not present

## 2012-05-07 DIAGNOSIS — M5106 Intervertebral disc disorders with myelopathy, lumbar region: Secondary | ICD-10-CM | POA: Diagnosis not present

## 2012-05-07 DIAGNOSIS — M4120 Other idiopathic scoliosis, site unspecified: Secondary | ICD-10-CM | POA: Diagnosis not present

## 2012-05-11 DIAGNOSIS — E785 Hyperlipidemia, unspecified: Secondary | ICD-10-CM | POA: Diagnosis not present

## 2012-05-11 DIAGNOSIS — I2510 Atherosclerotic heart disease of native coronary artery without angina pectoris: Secondary | ICD-10-CM | POA: Diagnosis not present

## 2012-05-11 DIAGNOSIS — I509 Heart failure, unspecified: Secondary | ICD-10-CM | POA: Diagnosis not present

## 2012-05-11 DIAGNOSIS — I219 Acute myocardial infarction, unspecified: Secondary | ICD-10-CM | POA: Diagnosis not present

## 2012-05-11 DIAGNOSIS — R0789 Other chest pain: Secondary | ICD-10-CM | POA: Diagnosis not present

## 2012-05-11 DIAGNOSIS — E1149 Type 2 diabetes mellitus with other diabetic neurological complication: Secondary | ICD-10-CM | POA: Diagnosis not present

## 2012-05-11 DIAGNOSIS — Z4789 Encounter for other orthopedic aftercare: Secondary | ICD-10-CM | POA: Diagnosis not present

## 2012-05-11 DIAGNOSIS — R269 Unspecified abnormalities of gait and mobility: Secondary | ICD-10-CM | POA: Diagnosis not present

## 2012-05-11 DIAGNOSIS — I214 Non-ST elevation (NSTEMI) myocardial infarction: Secondary | ICD-10-CM | POA: Diagnosis not present

## 2012-05-14 DIAGNOSIS — I509 Heart failure, unspecified: Secondary | ICD-10-CM | POA: Diagnosis not present

## 2012-05-14 DIAGNOSIS — R269 Unspecified abnormalities of gait and mobility: Secondary | ICD-10-CM | POA: Diagnosis not present

## 2012-05-14 DIAGNOSIS — E1149 Type 2 diabetes mellitus with other diabetic neurological complication: Secondary | ICD-10-CM | POA: Diagnosis not present

## 2012-05-14 DIAGNOSIS — I214 Non-ST elevation (NSTEMI) myocardial infarction: Secondary | ICD-10-CM | POA: Diagnosis not present

## 2012-05-14 DIAGNOSIS — Z4789 Encounter for other orthopedic aftercare: Secondary | ICD-10-CM | POA: Diagnosis not present

## 2012-05-18 DIAGNOSIS — I10 Essential (primary) hypertension: Secondary | ICD-10-CM | POA: Diagnosis not present

## 2012-05-20 DIAGNOSIS — Z4789 Encounter for other orthopedic aftercare: Secondary | ICD-10-CM | POA: Diagnosis not present

## 2012-05-20 DIAGNOSIS — I509 Heart failure, unspecified: Secondary | ICD-10-CM | POA: Diagnosis not present

## 2012-05-20 DIAGNOSIS — E1149 Type 2 diabetes mellitus with other diabetic neurological complication: Secondary | ICD-10-CM | POA: Diagnosis not present

## 2012-05-20 DIAGNOSIS — R269 Unspecified abnormalities of gait and mobility: Secondary | ICD-10-CM | POA: Diagnosis not present

## 2012-05-20 DIAGNOSIS — I214 Non-ST elevation (NSTEMI) myocardial infarction: Secondary | ICD-10-CM | POA: Diagnosis not present

## 2012-05-21 ENCOUNTER — Other Ambulatory Visit: Payer: Self-pay | Admitting: Physical Medicine and Rehabilitation

## 2012-05-27 DIAGNOSIS — E876 Hypokalemia: Secondary | ICD-10-CM | POA: Diagnosis not present

## 2012-05-27 DIAGNOSIS — I10 Essential (primary) hypertension: Secondary | ICD-10-CM | POA: Diagnosis not present

## 2012-06-26 DIAGNOSIS — Z4789 Encounter for other orthopedic aftercare: Secondary | ICD-10-CM | POA: Diagnosis not present

## 2012-06-26 DIAGNOSIS — R269 Unspecified abnormalities of gait and mobility: Secondary | ICD-10-CM | POA: Diagnosis not present

## 2012-06-26 DIAGNOSIS — I214 Non-ST elevation (NSTEMI) myocardial infarction: Secondary | ICD-10-CM | POA: Diagnosis not present

## 2012-06-26 DIAGNOSIS — E1149 Type 2 diabetes mellitus with other diabetic neurological complication: Secondary | ICD-10-CM | POA: Diagnosis not present

## 2012-06-26 DIAGNOSIS — I509 Heart failure, unspecified: Secondary | ICD-10-CM | POA: Diagnosis not present

## 2012-07-10 DIAGNOSIS — M4120 Other idiopathic scoliosis, site unspecified: Secondary | ICD-10-CM | POA: Diagnosis not present

## 2012-07-10 DIAGNOSIS — M5106 Intervertebral disc disorders with myelopathy, lumbar region: Secondary | ICD-10-CM | POA: Diagnosis not present

## 2012-07-10 DIAGNOSIS — IMO0001 Reserved for inherently not codable concepts without codable children: Secondary | ICD-10-CM | POA: Diagnosis not present

## 2012-07-10 DIAGNOSIS — M961 Postlaminectomy syndrome, not elsewhere classified: Secondary | ICD-10-CM | POA: Diagnosis not present

## 2012-07-10 DIAGNOSIS — M48061 Spinal stenosis, lumbar region without neurogenic claudication: Secondary | ICD-10-CM | POA: Diagnosis not present

## 2012-08-02 HISTORY — PX: OTHER SURGICAL HISTORY: SHX169

## 2012-08-02 HISTORY — PX: RENAL ARTERY STENT: SHX2321

## 2012-08-10 DIAGNOSIS — E785 Hyperlipidemia, unspecified: Secondary | ICD-10-CM | POA: Diagnosis not present

## 2012-08-10 DIAGNOSIS — I2510 Atherosclerotic heart disease of native coronary artery without angina pectoris: Secondary | ICD-10-CM | POA: Diagnosis not present

## 2012-08-10 DIAGNOSIS — I10 Essential (primary) hypertension: Secondary | ICD-10-CM | POA: Diagnosis not present

## 2012-08-10 DIAGNOSIS — I150 Renovascular hypertension: Secondary | ICD-10-CM | POA: Diagnosis not present

## 2012-08-12 DIAGNOSIS — M48061 Spinal stenosis, lumbar region without neurogenic claudication: Secondary | ICD-10-CM | POA: Diagnosis not present

## 2012-08-12 DIAGNOSIS — M5106 Intervertebral disc disorders with myelopathy, lumbar region: Secondary | ICD-10-CM | POA: Diagnosis not present

## 2012-08-12 DIAGNOSIS — M4120 Other idiopathic scoliosis, site unspecified: Secondary | ICD-10-CM | POA: Diagnosis not present

## 2012-08-12 DIAGNOSIS — M961 Postlaminectomy syndrome, not elsewhere classified: Secondary | ICD-10-CM | POA: Diagnosis not present

## 2012-08-13 ENCOUNTER — Other Ambulatory Visit: Payer: Self-pay | Admitting: Orthopaedic Surgery

## 2012-08-13 DIAGNOSIS — Z01818 Encounter for other preprocedural examination: Secondary | ICD-10-CM | POA: Diagnosis not present

## 2012-08-13 DIAGNOSIS — I2510 Atherosclerotic heart disease of native coronary artery without angina pectoris: Secondary | ICD-10-CM | POA: Diagnosis not present

## 2012-08-13 DIAGNOSIS — R0989 Other specified symptoms and signs involving the circulatory and respiratory systems: Secondary | ICD-10-CM | POA: Diagnosis not present

## 2012-08-13 DIAGNOSIS — E559 Vitamin D deficiency, unspecified: Secondary | ICD-10-CM | POA: Diagnosis not present

## 2012-08-13 DIAGNOSIS — Z79899 Other long term (current) drug therapy: Secondary | ICD-10-CM | POA: Diagnosis not present

## 2012-08-13 DIAGNOSIS — E785 Hyperlipidemia, unspecified: Secondary | ICD-10-CM | POA: Diagnosis not present

## 2012-08-13 DIAGNOSIS — M545 Low back pain: Secondary | ICD-10-CM

## 2012-08-13 DIAGNOSIS — I10 Essential (primary) hypertension: Secondary | ICD-10-CM | POA: Diagnosis not present

## 2012-08-17 ENCOUNTER — Ambulatory Visit
Admission: RE | Admit: 2012-08-17 | Discharge: 2012-08-17 | Disposition: A | Discharge status: Home / Self Care | Payer: Medicare Other | Source: Ambulatory Visit | Attending: Orthopaedic Surgery | Admitting: Orthopaedic Surgery

## 2012-08-17 DIAGNOSIS — M545 Low back pain, unspecified: Secondary | ICD-10-CM | POA: Diagnosis not present

## 2012-08-17 DIAGNOSIS — IMO0002 Reserved for concepts with insufficient information to code with codable children: Secondary | ICD-10-CM | POA: Diagnosis not present

## 2012-08-17 DIAGNOSIS — M533 Sacrococcygeal disorders, not elsewhere classified: Secondary | ICD-10-CM | POA: Diagnosis not present

## 2012-08-18 DIAGNOSIS — E559 Vitamin D deficiency, unspecified: Secondary | ICD-10-CM | POA: Diagnosis not present

## 2012-08-18 DIAGNOSIS — R0989 Other specified symptoms and signs involving the circulatory and respiratory systems: Secondary | ICD-10-CM | POA: Diagnosis not present

## 2012-08-18 DIAGNOSIS — R072 Precordial pain: Secondary | ICD-10-CM | POA: Diagnosis not present

## 2012-08-18 DIAGNOSIS — I2510 Atherosclerotic heart disease of native coronary artery without angina pectoris: Secondary | ICD-10-CM | POA: Diagnosis not present

## 2012-08-18 DIAGNOSIS — I701 Atherosclerosis of renal artery: Secondary | ICD-10-CM | POA: Diagnosis not present

## 2012-08-18 DIAGNOSIS — I10 Essential (primary) hypertension: Secondary | ICD-10-CM | POA: Diagnosis not present

## 2012-08-18 DIAGNOSIS — Z79899 Other long term (current) drug therapy: Secondary | ICD-10-CM | POA: Diagnosis not present

## 2012-08-18 DIAGNOSIS — E785 Hyperlipidemia, unspecified: Secondary | ICD-10-CM | POA: Diagnosis not present

## 2012-08-29 DIAGNOSIS — I701 Atherosclerosis of renal artery: Secondary | ICD-10-CM | POA: Insufficient documentation

## 2012-09-03 DIAGNOSIS — M461 Sacroiliitis, not elsewhere classified: Secondary | ICD-10-CM | POA: Diagnosis not present

## 2012-09-15 DIAGNOSIS — I2510 Atherosclerotic heart disease of native coronary artery without angina pectoris: Secondary | ICD-10-CM | POA: Diagnosis not present

## 2012-09-15 DIAGNOSIS — I10 Essential (primary) hypertension: Secondary | ICD-10-CM | POA: Diagnosis not present

## 2012-09-15 DIAGNOSIS — I150 Renovascular hypertension: Secondary | ICD-10-CM | POA: Diagnosis not present

## 2012-09-15 DIAGNOSIS — E785 Hyperlipidemia, unspecified: Secondary | ICD-10-CM | POA: Diagnosis not present

## 2012-09-22 DIAGNOSIS — M461 Sacroiliitis, not elsewhere classified: Secondary | ICD-10-CM | POA: Diagnosis not present

## 2012-10-07 DIAGNOSIS — M4120 Other idiopathic scoliosis, site unspecified: Secondary | ICD-10-CM | POA: Diagnosis not present

## 2012-10-07 DIAGNOSIS — M961 Postlaminectomy syndrome, not elsewhere classified: Secondary | ICD-10-CM | POA: Diagnosis not present

## 2012-10-07 DIAGNOSIS — M5106 Intervertebral disc disorders with myelopathy, lumbar region: Secondary | ICD-10-CM | POA: Diagnosis not present

## 2012-10-07 DIAGNOSIS — M461 Sacroiliitis, not elsewhere classified: Secondary | ICD-10-CM | POA: Diagnosis not present

## 2012-10-26 DIAGNOSIS — I2510 Atherosclerotic heart disease of native coronary artery without angina pectoris: Secondary | ICD-10-CM | POA: Diagnosis not present

## 2012-10-26 DIAGNOSIS — I150 Renovascular hypertension: Secondary | ICD-10-CM | POA: Diagnosis not present

## 2012-10-26 DIAGNOSIS — I10 Essential (primary) hypertension: Secondary | ICD-10-CM | POA: Diagnosis not present

## 2012-10-26 DIAGNOSIS — E785 Hyperlipidemia, unspecified: Secondary | ICD-10-CM | POA: Diagnosis not present

## 2012-11-02 ENCOUNTER — Encounter: Payer: Self-pay | Admitting: Physician Assistant

## 2012-11-10 DIAGNOSIS — E1149 Type 2 diabetes mellitus with other diabetic neurological complication: Secondary | ICD-10-CM | POA: Diagnosis not present

## 2012-11-10 DIAGNOSIS — L0390 Cellulitis, unspecified: Secondary | ICD-10-CM | POA: Diagnosis not present

## 2012-11-10 DIAGNOSIS — E539 Vitamin B deficiency, unspecified: Secondary | ICD-10-CM | POA: Diagnosis not present

## 2012-11-10 DIAGNOSIS — L0291 Cutaneous abscess, unspecified: Secondary | ICD-10-CM | POA: Diagnosis not present

## 2012-11-10 DIAGNOSIS — E669 Obesity, unspecified: Secondary | ICD-10-CM | POA: Diagnosis not present

## 2012-11-10 DIAGNOSIS — E1142 Type 2 diabetes mellitus with diabetic polyneuropathy: Secondary | ICD-10-CM | POA: Diagnosis not present

## 2012-11-10 DIAGNOSIS — E785 Hyperlipidemia, unspecified: Secondary | ICD-10-CM | POA: Diagnosis not present

## 2012-11-10 DIAGNOSIS — E538 Deficiency of other specified B group vitamins: Secondary | ICD-10-CM | POA: Diagnosis not present

## 2012-11-10 DIAGNOSIS — E1129 Type 2 diabetes mellitus with other diabetic kidney complication: Secondary | ICD-10-CM | POA: Diagnosis not present

## 2012-11-10 DIAGNOSIS — I10 Essential (primary) hypertension: Secondary | ICD-10-CM | POA: Diagnosis not present

## 2012-11-10 DIAGNOSIS — E559 Vitamin D deficiency, unspecified: Secondary | ICD-10-CM | POA: Diagnosis not present

## 2012-11-12 DIAGNOSIS — E1142 Type 2 diabetes mellitus with diabetic polyneuropathy: Secondary | ICD-10-CM | POA: Diagnosis not present

## 2012-11-12 DIAGNOSIS — M21619 Bunion of unspecified foot: Secondary | ICD-10-CM | POA: Diagnosis not present

## 2012-11-12 DIAGNOSIS — M25569 Pain in unspecified knee: Secondary | ICD-10-CM | POA: Diagnosis not present

## 2012-11-12 DIAGNOSIS — E1149 Type 2 diabetes mellitus with other diabetic neurological complication: Secondary | ICD-10-CM | POA: Diagnosis not present

## 2012-11-12 DIAGNOSIS — B351 Tinea unguium: Secondary | ICD-10-CM | POA: Diagnosis not present

## 2012-11-12 DIAGNOSIS — S8010XA Contusion of unspecified lower leg, initial encounter: Secondary | ICD-10-CM | POA: Diagnosis not present

## 2012-11-12 DIAGNOSIS — M2040 Other hammer toe(s) (acquired), unspecified foot: Secondary | ICD-10-CM | POA: Diagnosis not present

## 2012-11-12 DIAGNOSIS — L84 Corns and callosities: Secondary | ICD-10-CM | POA: Diagnosis not present

## 2012-11-13 DIAGNOSIS — E876 Hypokalemia: Secondary | ICD-10-CM | POA: Diagnosis not present

## 2012-11-17 DIAGNOSIS — E876 Hypokalemia: Secondary | ICD-10-CM | POA: Diagnosis not present

## 2012-11-19 DIAGNOSIS — L0291 Cutaneous abscess, unspecified: Secondary | ICD-10-CM | POA: Diagnosis not present

## 2012-11-19 DIAGNOSIS — E876 Hypokalemia: Secondary | ICD-10-CM | POA: Diagnosis not present

## 2012-11-19 DIAGNOSIS — R609 Edema, unspecified: Secondary | ICD-10-CM | POA: Diagnosis not present

## 2012-11-19 DIAGNOSIS — I10 Essential (primary) hypertension: Secondary | ICD-10-CM | POA: Diagnosis not present

## 2012-11-19 DIAGNOSIS — L0390 Cellulitis, unspecified: Secondary | ICD-10-CM | POA: Diagnosis not present

## 2012-11-20 DIAGNOSIS — M545 Low back pain, unspecified: Secondary | ICD-10-CM | POA: Diagnosis not present

## 2012-11-20 DIAGNOSIS — I2510 Atherosclerotic heart disease of native coronary artery without angina pectoris: Secondary | ICD-10-CM | POA: Diagnosis present

## 2012-11-20 DIAGNOSIS — M5137 Other intervertebral disc degeneration, lumbosacral region: Secondary | ICD-10-CM | POA: Diagnosis present

## 2012-11-20 DIAGNOSIS — E119 Type 2 diabetes mellitus without complications: Secondary | ICD-10-CM | POA: Diagnosis present

## 2012-11-20 DIAGNOSIS — M4120 Other idiopathic scoliosis, site unspecified: Secondary | ICD-10-CM | POA: Diagnosis not present

## 2012-11-20 DIAGNOSIS — M461 Sacroiliitis, not elsewhere classified: Secondary | ICD-10-CM | POA: Diagnosis not present

## 2012-11-20 DIAGNOSIS — M5106 Intervertebral disc disorders with myelopathy, lumbar region: Secondary | ICD-10-CM | POA: Diagnosis not present

## 2012-11-20 DIAGNOSIS — I10 Essential (primary) hypertension: Secondary | ICD-10-CM | POA: Diagnosis present

## 2012-11-20 DIAGNOSIS — K449 Diaphragmatic hernia without obstruction or gangrene: Secondary | ICD-10-CM | POA: Diagnosis present

## 2012-11-20 DIAGNOSIS — K219 Gastro-esophageal reflux disease without esophagitis: Secondary | ICD-10-CM | POA: Diagnosis present

## 2012-11-20 DIAGNOSIS — M961 Postlaminectomy syndrome, not elsewhere classified: Secondary | ICD-10-CM | POA: Diagnosis not present

## 2012-11-20 DIAGNOSIS — I252 Old myocardial infarction: Secondary | ICD-10-CM | POA: Diagnosis not present

## 2012-11-20 DIAGNOSIS — I509 Heart failure, unspecified: Secondary | ICD-10-CM | POA: Diagnosis present

## 2012-11-20 DIAGNOSIS — Z90710 Acquired absence of both cervix and uterus: Secondary | ICD-10-CM | POA: Diagnosis not present

## 2012-11-23 DIAGNOSIS — M545 Low back pain: Secondary | ICD-10-CM | POA: Diagnosis not present

## 2012-11-23 DIAGNOSIS — M461 Sacroiliitis, not elsewhere classified: Secondary | ICD-10-CM | POA: Diagnosis not present

## 2012-11-26 ENCOUNTER — Encounter: Payer: Self-pay | Admitting: Cardiology

## 2012-11-26 DIAGNOSIS — L821 Other seborrheic keratosis: Secondary | ICD-10-CM | POA: Diagnosis not present

## 2012-11-28 DIAGNOSIS — I10 Essential (primary) hypertension: Secondary | ICD-10-CM | POA: Diagnosis not present

## 2012-12-03 DIAGNOSIS — E876 Hypokalemia: Secondary | ICD-10-CM | POA: Diagnosis not present

## 2012-12-07 DIAGNOSIS — E876 Hypokalemia: Secondary | ICD-10-CM | POA: Diagnosis not present

## 2012-12-07 DIAGNOSIS — E119 Type 2 diabetes mellitus without complications: Secondary | ICD-10-CM | POA: Diagnosis not present

## 2012-12-07 DIAGNOSIS — H25049 Posterior subcapsular polar age-related cataract, unspecified eye: Secondary | ICD-10-CM | POA: Diagnosis not present

## 2012-12-07 DIAGNOSIS — H43819 Vitreous degeneration, unspecified eye: Secondary | ICD-10-CM | POA: Diagnosis not present

## 2012-12-07 DIAGNOSIS — H2510 Age-related nuclear cataract, unspecified eye: Secondary | ICD-10-CM | POA: Diagnosis not present

## 2012-12-17 DIAGNOSIS — G609 Hereditary and idiopathic neuropathy, unspecified: Secondary | ICD-10-CM | POA: Diagnosis not present

## 2012-12-17 DIAGNOSIS — E1142 Type 2 diabetes mellitus with diabetic polyneuropathy: Secondary | ICD-10-CM | POA: Diagnosis not present

## 2012-12-17 DIAGNOSIS — M6750 Plica syndrome, unspecified knee: Secondary | ICD-10-CM | POA: Diagnosis not present

## 2012-12-17 DIAGNOSIS — E1149 Type 2 diabetes mellitus with other diabetic neurological complication: Secondary | ICD-10-CM | POA: Diagnosis not present

## 2012-12-21 DIAGNOSIS — M961 Postlaminectomy syndrome, not elsewhere classified: Secondary | ICD-10-CM | POA: Diagnosis not present

## 2012-12-21 DIAGNOSIS — M461 Sacroiliitis, not elsewhere classified: Secondary | ICD-10-CM | POA: Diagnosis not present

## 2012-12-21 DIAGNOSIS — M545 Low back pain: Secondary | ICD-10-CM | POA: Diagnosis not present

## 2012-12-21 DIAGNOSIS — M4120 Other idiopathic scoliosis, site unspecified: Secondary | ICD-10-CM | POA: Diagnosis not present

## 2012-12-24 DIAGNOSIS — E785 Hyperlipidemia, unspecified: Secondary | ICD-10-CM | POA: Diagnosis not present

## 2012-12-24 DIAGNOSIS — I2510 Atherosclerotic heart disease of native coronary artery without angina pectoris: Secondary | ICD-10-CM | POA: Diagnosis not present

## 2012-12-24 DIAGNOSIS — R0789 Other chest pain: Secondary | ICD-10-CM | POA: Diagnosis not present

## 2012-12-24 DIAGNOSIS — I10 Essential (primary) hypertension: Secondary | ICD-10-CM | POA: Diagnosis not present

## 2013-01-07 DIAGNOSIS — G5760 Lesion of plantar nerve, unspecified lower limb: Secondary | ICD-10-CM | POA: Diagnosis not present

## 2013-01-07 DIAGNOSIS — M6750 Plica syndrome, unspecified knee: Secondary | ICD-10-CM | POA: Diagnosis not present

## 2013-01-07 DIAGNOSIS — M19049 Primary osteoarthritis, unspecified hand: Secondary | ICD-10-CM | POA: Diagnosis not present

## 2013-01-07 DIAGNOSIS — M25549 Pain in joints of unspecified hand: Secondary | ICD-10-CM | POA: Diagnosis not present

## 2013-01-21 DIAGNOSIS — M19049 Primary osteoarthritis, unspecified hand: Secondary | ICD-10-CM | POA: Diagnosis not present

## 2013-01-21 DIAGNOSIS — M25519 Pain in unspecified shoulder: Secondary | ICD-10-CM | POA: Diagnosis not present

## 2013-01-21 DIAGNOSIS — M79609 Pain in unspecified limb: Secondary | ICD-10-CM | POA: Diagnosis not present

## 2013-01-26 ENCOUNTER — Other Ambulatory Visit: Payer: Self-pay | Admitting: *Deleted

## 2013-01-26 MED ORDER — FUROSEMIDE 40 MG PO TABS: 80.0000 mg | ORAL_TABLET | Freq: Two times a day (BID) | ORAL | Status: DC

## 2013-01-27 DIAGNOSIS — IMO0001 Reserved for inherently not codable concepts without codable children: Secondary | ICD-10-CM | POA: Diagnosis not present

## 2013-01-27 DIAGNOSIS — M545 Low back pain: Secondary | ICD-10-CM | POA: Diagnosis not present

## 2013-01-29 DIAGNOSIS — M25519 Pain in unspecified shoulder: Secondary | ICD-10-CM | POA: Diagnosis not present

## 2013-02-02 ENCOUNTER — Other Ambulatory Visit: Payer: Self-pay

## 2013-02-02 DIAGNOSIS — M898X9 Other specified disorders of bone, unspecified site: Secondary | ICD-10-CM | POA: Diagnosis not present

## 2013-02-02 DIAGNOSIS — M6749 Ganglion, multiple sites: Secondary | ICD-10-CM | POA: Diagnosis not present

## 2013-02-02 DIAGNOSIS — L723 Sebaceous cyst: Secondary | ICD-10-CM | POA: Diagnosis not present

## 2013-02-18 DIAGNOSIS — M961 Postlaminectomy syndrome, not elsewhere classified: Secondary | ICD-10-CM | POA: Diagnosis not present

## 2013-02-18 DIAGNOSIS — M48061 Spinal stenosis, lumbar region without neurogenic claudication: Secondary | ICD-10-CM | POA: Diagnosis not present

## 2013-02-18 DIAGNOSIS — M545 Low back pain: Secondary | ICD-10-CM | POA: Diagnosis not present

## 2013-02-18 DIAGNOSIS — M25569 Pain in unspecified knee: Secondary | ICD-10-CM | POA: Diagnosis not present

## 2013-02-18 DIAGNOSIS — M461 Sacroiliitis, not elsewhere classified: Secondary | ICD-10-CM | POA: Diagnosis not present

## 2013-02-25 ENCOUNTER — Telehealth: Payer: Self-pay | Admitting: Cardiology

## 2013-02-25 DIAGNOSIS — E1129 Type 2 diabetes mellitus with other diabetic kidney complication: Secondary | ICD-10-CM | POA: Diagnosis not present

## 2013-02-25 DIAGNOSIS — E669 Obesity, unspecified: Secondary | ICD-10-CM | POA: Diagnosis not present

## 2013-02-25 DIAGNOSIS — I10 Essential (primary) hypertension: Secondary | ICD-10-CM | POA: Diagnosis not present

## 2013-02-25 DIAGNOSIS — N058 Unspecified nephritic syndrome with other morphologic changes: Secondary | ICD-10-CM | POA: Diagnosis not present

## 2013-02-25 DIAGNOSIS — R809 Proteinuria, unspecified: Secondary | ICD-10-CM | POA: Diagnosis not present

## 2013-02-25 DIAGNOSIS — E785 Hyperlipidemia, unspecified: Secondary | ICD-10-CM | POA: Diagnosis not present

## 2013-02-25 NOTE — Telephone Encounter (Signed)
Pt is changing pharmacy-need new prescriptions for Metolazone 2.5mg   And Furosemide 40mg -New Pharmacy is [facility]-Fax-[phone]-Phone#-[phone]

## 2013-02-26 ENCOUNTER — Other Ambulatory Visit: Payer: Self-pay | Admitting: *Deleted

## 2013-02-26 MED ORDER — FUROSEMIDE 40 MG PO TABS: 80.0000 mg | ORAL_TABLET | Freq: Two times a day (BID) | ORAL | Status: DC

## 2013-02-26 MED ORDER — METOLAZONE 2.5 MG PO TABS: ORAL_TABLET | ORAL | Status: DC

## 2013-02-26 NOTE — Telephone Encounter (Signed)
Refill on Lasix and Metolazone. No refills. Pt notified she needs an appt.

## 2013-02-27 DIAGNOSIS — E1129 Type 2 diabetes mellitus with other diabetic kidney complication: Secondary | ICD-10-CM | POA: Diagnosis not present

## 2013-02-27 DIAGNOSIS — E559 Vitamin D deficiency, unspecified: Secondary | ICD-10-CM | POA: Diagnosis not present

## 2013-02-27 DIAGNOSIS — E785 Hyperlipidemia, unspecified: Secondary | ICD-10-CM | POA: Diagnosis not present

## 2013-02-27 DIAGNOSIS — Z79899 Other long term (current) drug therapy: Secondary | ICD-10-CM | POA: Diagnosis not present

## 2013-02-27 DIAGNOSIS — E539 Vitamin B deficiency, unspecified: Secondary | ICD-10-CM | POA: Diagnosis not present

## 2013-03-12 DIAGNOSIS — S0003XA Contusion of scalp, initial encounter: Secondary | ICD-10-CM | POA: Diagnosis not present

## 2013-03-12 DIAGNOSIS — M25569 Pain in unspecified knee: Secondary | ICD-10-CM | POA: Diagnosis not present

## 2013-03-12 DIAGNOSIS — S1093XA Contusion of unspecified part of neck, initial encounter: Secondary | ICD-10-CM | POA: Diagnosis not present

## 2013-03-16 DIAGNOSIS — H25049 Posterior subcapsular polar age-related cataract, unspecified eye: Secondary | ICD-10-CM | POA: Diagnosis not present

## 2013-03-16 DIAGNOSIS — H2510 Age-related nuclear cataract, unspecified eye: Secondary | ICD-10-CM | POA: Diagnosis not present

## 2013-03-23 DIAGNOSIS — M961 Postlaminectomy syndrome, not elsewhere classified: Secondary | ICD-10-CM | POA: Diagnosis not present

## 2013-03-23 DIAGNOSIS — M461 Sacroiliitis, not elsewhere classified: Secondary | ICD-10-CM | POA: Diagnosis not present

## 2013-03-24 IMAGING — CT CT PELVIS W/O CM
1 of 4 series · 15 of 36 positions shown, 19 images · non-contrast
Comparison: CT abdomen and pelvis 12/03/2007.

CLINICAL DATA: Low back and sacral pain.  Status post fall
July 2012 with subsequent fall 08/16/2012.

CT PELVIS WITHOUT CONTRAST
TECHNIQUE: Multidetector CT imaging of the pelvis was performed
following the standard protocol without intravenous contrast.

[Series 3: pelvis standard · axial · 0.70mm/px · z∈[-245,-35]mm · 15 of 95 slices shown, 19 images]
[im 7/95  soft-tissue]
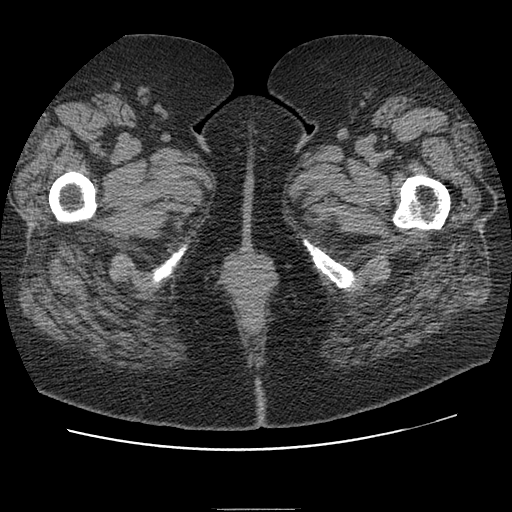
[im 7/95  bone]
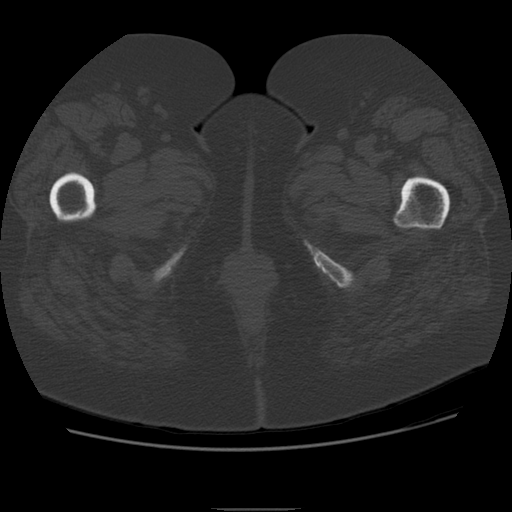
[im 13/95  soft-tissue]
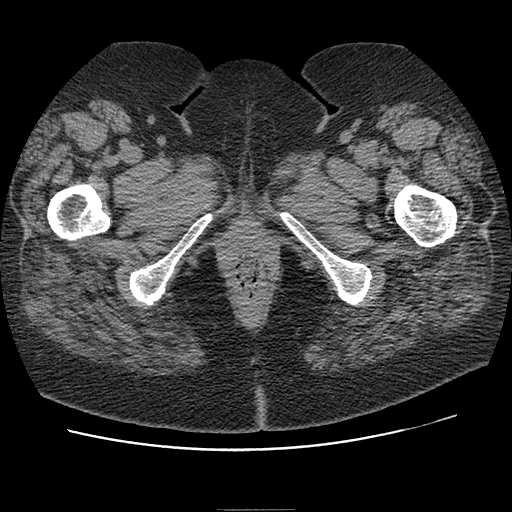
[im 19/95  soft-tissue]
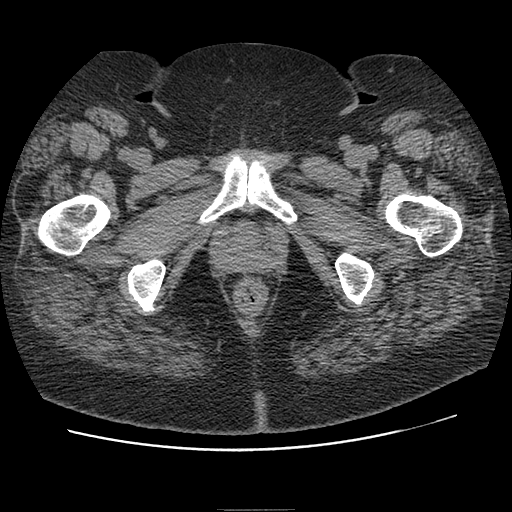
[im 28/95  soft-tissue]
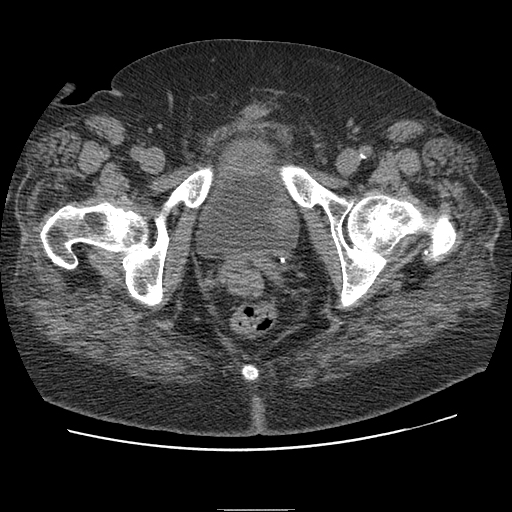
[im 34/95  soft-tissue]
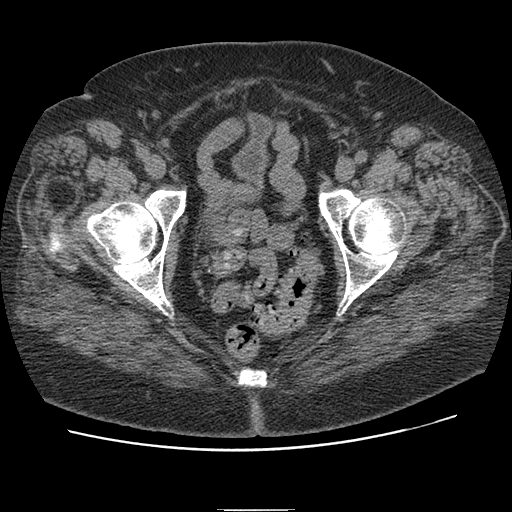
[im 40/95  soft-tissue]
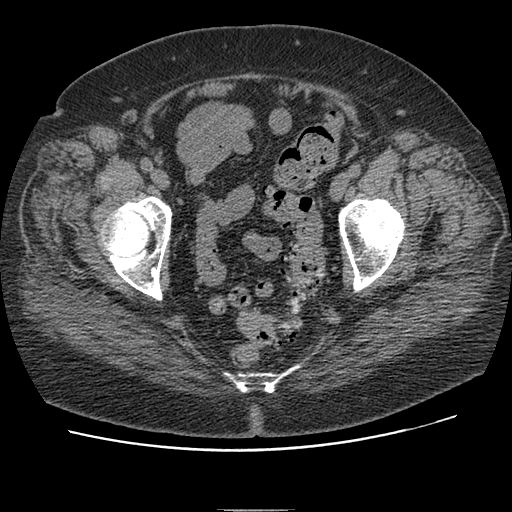
[im 49/95  soft-tissue]
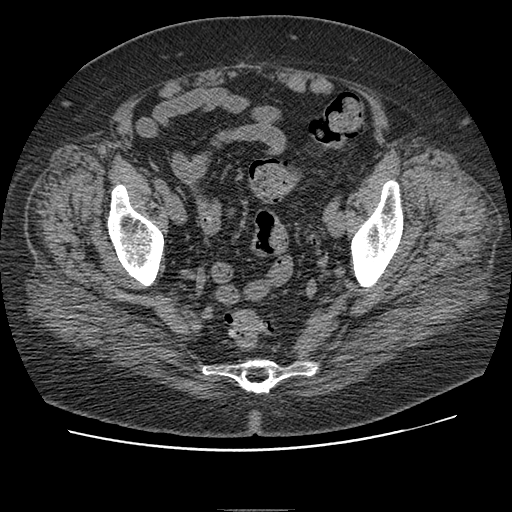
[im 55/95  soft-tissue]
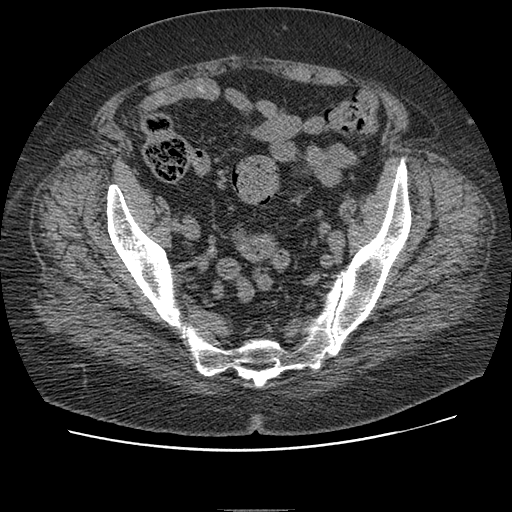
[im 61/95  soft-tissue]
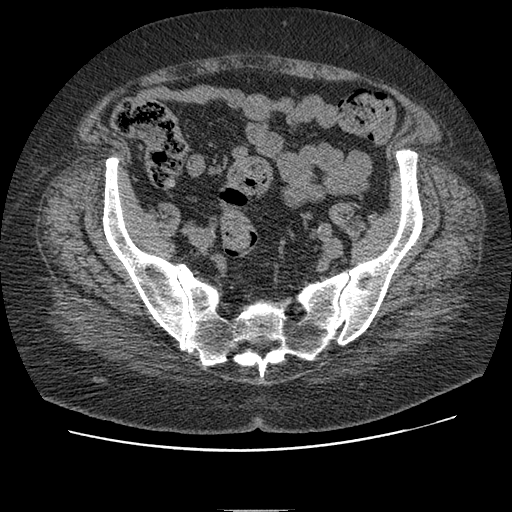
[im 61/95  bone]
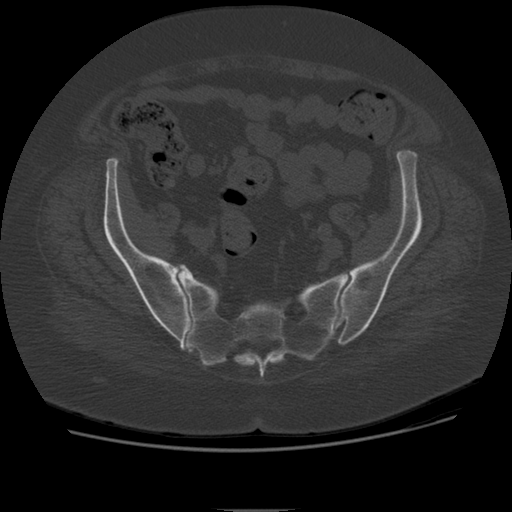
[im 67/95  soft-tissue]
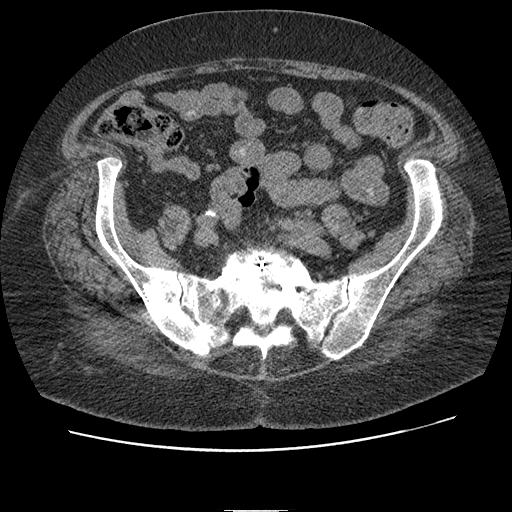
[im 76/95  soft-tissue]
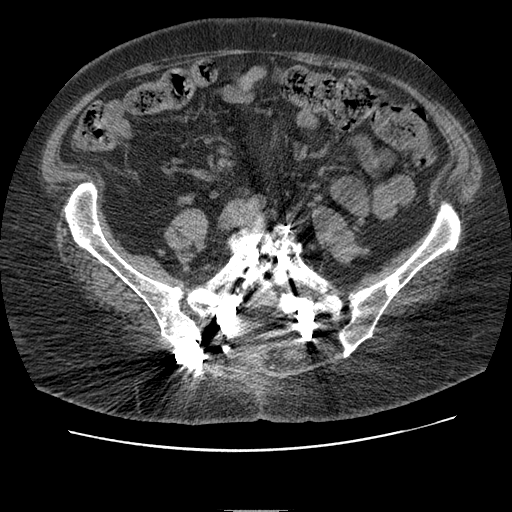
[im 82/95  soft-tissue]
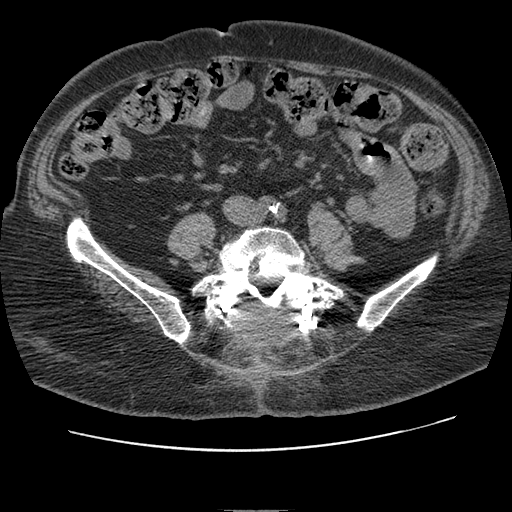
[im 82/95  lung]
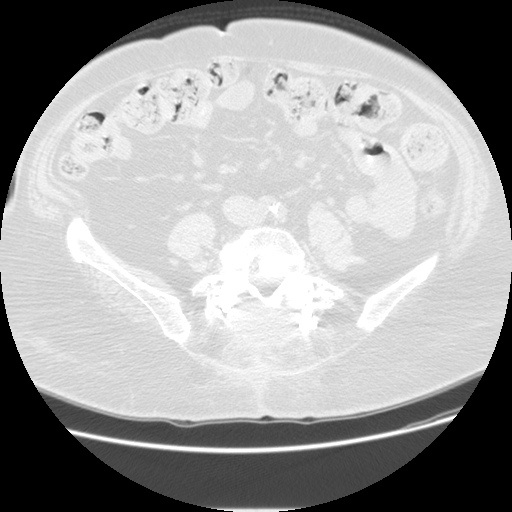
[im 85/95  lung]
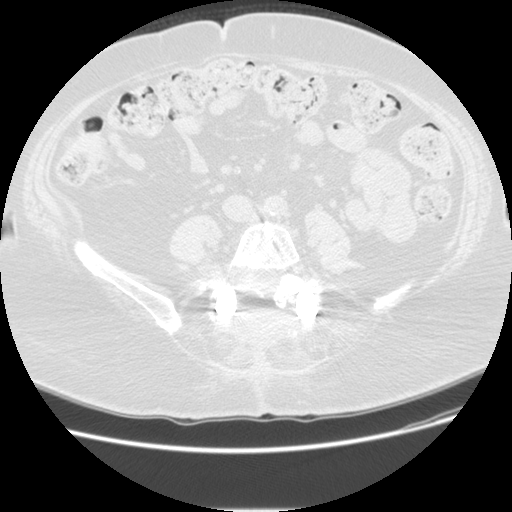
[im 88/95  soft-tissue]
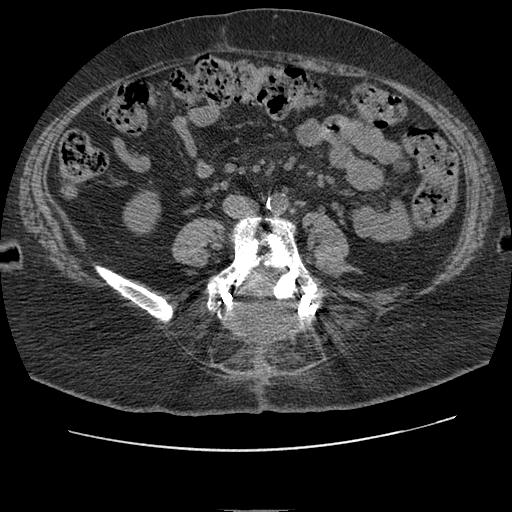
[im 88/95  lung]
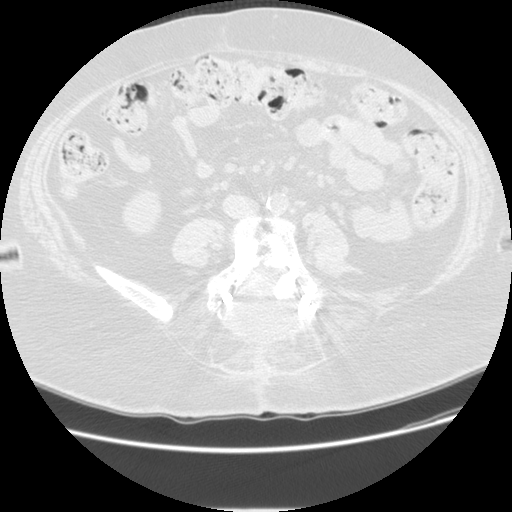
[im 91/95  lung]
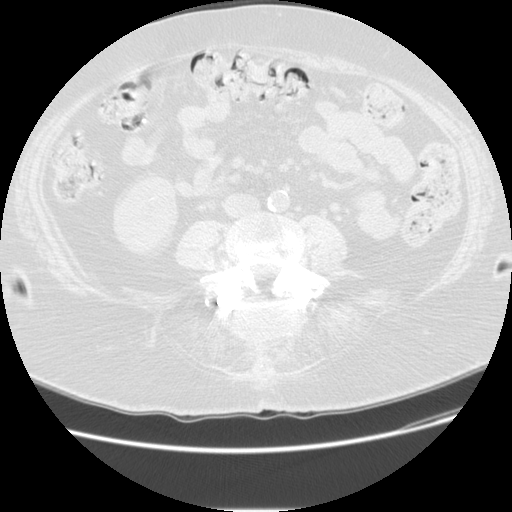

[15 of 36 positions shown; findings below may reference images not displayed]

FINDINGS: No fracture is identified.  The patient has some
degenerative change about the symphysis pubis and sacroiliac
joints.  Sacroiliac joint degenerative change appears greater on
the right.  Both hips are located.  No hip joint effusion is
identified.  Extensive spinal fusion hardware is noted and
described on report of lumbar spine CT scan this same date.  Imaged
intrapelvic contents demonstrate extensive sigmoid diverticular
disease without evidence of diverticulitis.  The patient is status
post hysterectomy.
IMPRESSION: 1.  Negative for fracture or acute abnormality.
2.  Degenerative change symphysis pubis and right sacroiliac joint.
3.  Sigmoid diverticulosis.

## 2013-03-25 ENCOUNTER — Other Ambulatory Visit: Payer: Self-pay | Admitting: Cardiology

## 2013-03-30 DIAGNOSIS — H25049 Posterior subcapsular polar age-related cataract, unspecified eye: Secondary | ICD-10-CM | POA: Diagnosis not present

## 2013-03-30 DIAGNOSIS — H269 Unspecified cataract: Secondary | ICD-10-CM | POA: Diagnosis not present

## 2013-03-30 DIAGNOSIS — H2510 Age-related nuclear cataract, unspecified eye: Secondary | ICD-10-CM | POA: Diagnosis not present

## 2013-04-07 DIAGNOSIS — E1149 Type 2 diabetes mellitus with other diabetic neurological complication: Secondary | ICD-10-CM | POA: Diagnosis not present

## 2013-04-07 DIAGNOSIS — E1142 Type 2 diabetes mellitus with diabetic polyneuropathy: Secondary | ICD-10-CM | POA: Diagnosis not present

## 2013-04-20 ENCOUNTER — Other Ambulatory Visit: Payer: Self-pay | Admitting: Cardiology

## 2013-04-28 DIAGNOSIS — M461 Sacroiliitis, not elsewhere classified: Secondary | ICD-10-CM | POA: Diagnosis not present

## 2013-04-28 DIAGNOSIS — M961 Postlaminectomy syndrome, not elsewhere classified: Secondary | ICD-10-CM | POA: Diagnosis not present

## 2013-04-28 DIAGNOSIS — M545 Low back pain: Secondary | ICD-10-CM | POA: Diagnosis not present

## 2013-04-29 ENCOUNTER — Other Ambulatory Visit: Payer: Self-pay | Admitting: Rehabilitation

## 2013-04-29 DIAGNOSIS — M545 Low back pain: Secondary | ICD-10-CM

## 2013-04-29 DIAGNOSIS — M461 Sacroiliitis, not elsewhere classified: Secondary | ICD-10-CM

## 2013-05-05 DIAGNOSIS — H25049 Posterior subcapsular polar age-related cataract, unspecified eye: Secondary | ICD-10-CM | POA: Diagnosis not present

## 2013-05-05 DIAGNOSIS — H2510 Age-related nuclear cataract, unspecified eye: Secondary | ICD-10-CM | POA: Diagnosis not present

## 2013-05-10 ENCOUNTER — Other Ambulatory Visit: Payer: Self-pay | Admitting: Rehabilitation

## 2013-05-10 ENCOUNTER — Ambulatory Visit
Admission: RE | Admit: 2013-05-10 | Discharge: 2013-05-10 | Disposition: A | Discharge status: Home / Self Care | Payer: Medicare Other | Source: Ambulatory Visit | Attending: Rehabilitation | Admitting: Rehabilitation

## 2013-05-10 DIAGNOSIS — M549 Dorsalgia, unspecified: Secondary | ICD-10-CM | POA: Diagnosis not present

## 2013-05-10 DIAGNOSIS — M461 Sacroiliitis, not elsewhere classified: Secondary | ICD-10-CM

## 2013-05-10 DIAGNOSIS — M533 Sacrococcygeal disorders, not elsewhere classified: Secondary | ICD-10-CM | POA: Diagnosis not present

## 2013-05-10 DIAGNOSIS — M545 Low back pain: Secondary | ICD-10-CM

## 2013-05-10 LAB — CREATININE, SERUM: Creat: 0.9 mg/dL (ref 0.50–1.10)

## 2013-05-10 MED ORDER — GADOBENATE DIMEGLUMINE 529 MG/ML IV SOLN
20.0000 mL | Freq: Once | INTRAVENOUS | Status: AC | PRN
  Administered 2013-05-10: 20 mL via INTRAVENOUS

## 2013-05-11 DIAGNOSIS — H269 Unspecified cataract: Secondary | ICD-10-CM | POA: Diagnosis not present

## 2013-05-11 DIAGNOSIS — H2510 Age-related nuclear cataract, unspecified eye: Secondary | ICD-10-CM | POA: Diagnosis not present

## 2013-05-11 DIAGNOSIS — H25049 Posterior subcapsular polar age-related cataract, unspecified eye: Secondary | ICD-10-CM | POA: Diagnosis not present

## 2013-05-13 DIAGNOSIS — M545 Low back pain: Secondary | ICD-10-CM | POA: Diagnosis not present

## 2013-05-13 DIAGNOSIS — M461 Sacroiliitis, not elsewhere classified: Secondary | ICD-10-CM | POA: Diagnosis not present

## 2013-05-18 DIAGNOSIS — M21619 Bunion of unspecified foot: Secondary | ICD-10-CM | POA: Diagnosis not present

## 2013-05-18 DIAGNOSIS — E1142 Type 2 diabetes mellitus with diabetic polyneuropathy: Secondary | ICD-10-CM | POA: Diagnosis not present

## 2013-05-18 DIAGNOSIS — E1149 Type 2 diabetes mellitus with other diabetic neurological complication: Secondary | ICD-10-CM | POA: Diagnosis not present

## 2013-05-18 DIAGNOSIS — B351 Tinea unguium: Secondary | ICD-10-CM | POA: Diagnosis not present

## 2013-05-26 DIAGNOSIS — Z981 Arthrodesis status: Secondary | ICD-10-CM | POA: Diagnosis not present

## 2013-05-26 DIAGNOSIS — M545 Low back pain, unspecified: Secondary | ICD-10-CM | POA: Diagnosis not present

## 2013-05-26 DIAGNOSIS — IMO0002 Reserved for concepts with insufficient information to code with codable children: Secondary | ICD-10-CM | POA: Diagnosis not present

## 2013-05-27 ENCOUNTER — Other Ambulatory Visit: Payer: Self-pay | Admitting: Cardiology

## 2013-06-21 ENCOUNTER — Other Ambulatory Visit: Payer: Self-pay | Admitting: Cardiology

## 2013-06-24 DIAGNOSIS — M461 Sacroiliitis, not elsewhere classified: Secondary | ICD-10-CM | POA: Diagnosis not present

## 2013-06-24 DIAGNOSIS — M545 Low back pain: Secondary | ICD-10-CM | POA: Diagnosis not present

## 2013-06-30 DIAGNOSIS — M461 Sacroiliitis, not elsewhere classified: Secondary | ICD-10-CM | POA: Diagnosis not present

## 2013-07-13 DIAGNOSIS — R0789 Other chest pain: Secondary | ICD-10-CM | POA: Diagnosis not present

## 2013-07-13 DIAGNOSIS — E785 Hyperlipidemia, unspecified: Secondary | ICD-10-CM | POA: Diagnosis not present

## 2013-07-13 DIAGNOSIS — I10 Essential (primary) hypertension: Secondary | ICD-10-CM | POA: Diagnosis not present

## 2013-07-13 DIAGNOSIS — I2510 Atherosclerotic heart disease of native coronary artery without angina pectoris: Secondary | ICD-10-CM | POA: Diagnosis not present

## 2013-07-23 DIAGNOSIS — M961 Postlaminectomy syndrome, not elsewhere classified: Secondary | ICD-10-CM | POA: Diagnosis not present

## 2013-07-23 DIAGNOSIS — M545 Low back pain: Secondary | ICD-10-CM | POA: Diagnosis not present

## 2013-07-23 DIAGNOSIS — M461 Sacroiliitis, not elsewhere classified: Secondary | ICD-10-CM | POA: Diagnosis not present

## 2013-07-23 DIAGNOSIS — M48061 Spinal stenosis, lumbar region without neurogenic claudication: Secondary | ICD-10-CM | POA: Diagnosis not present

## 2013-07-28 DIAGNOSIS — M461 Sacroiliitis, not elsewhere classified: Secondary | ICD-10-CM | POA: Diagnosis not present

## 2013-07-28 DIAGNOSIS — I150 Renovascular hypertension: Secondary | ICD-10-CM | POA: Diagnosis not present

## 2013-08-10 DIAGNOSIS — M21619 Bunion of unspecified foot: Secondary | ICD-10-CM | POA: Diagnosis not present

## 2013-08-10 DIAGNOSIS — M2040 Other hammer toe(s) (acquired), unspecified foot: Secondary | ICD-10-CM | POA: Diagnosis not present

## 2013-08-10 DIAGNOSIS — E1169 Type 2 diabetes mellitus with other specified complication: Secondary | ICD-10-CM | POA: Diagnosis not present

## 2013-08-10 DIAGNOSIS — L97509 Non-pressure chronic ulcer of other part of unspecified foot with unspecified severity: Secondary | ICD-10-CM | POA: Diagnosis not present

## 2013-08-11 DIAGNOSIS — E785 Hyperlipidemia, unspecified: Secondary | ICD-10-CM | POA: Diagnosis not present

## 2013-08-11 DIAGNOSIS — Z0000 Encounter for general adult medical examination without abnormal findings: Secondary | ICD-10-CM | POA: Diagnosis not present

## 2013-08-11 DIAGNOSIS — E669 Obesity, unspecified: Secondary | ICD-10-CM | POA: Diagnosis not present

## 2013-08-11 DIAGNOSIS — R609 Edema, unspecified: Secondary | ICD-10-CM | POA: Diagnosis not present

## 2013-08-11 DIAGNOSIS — R809 Proteinuria, unspecified: Secondary | ICD-10-CM | POA: Diagnosis not present

## 2013-08-11 DIAGNOSIS — I10 Essential (primary) hypertension: Secondary | ICD-10-CM | POA: Diagnosis not present

## 2013-08-11 DIAGNOSIS — E1129 Type 2 diabetes mellitus with other diabetic kidney complication: Secondary | ICD-10-CM | POA: Diagnosis not present

## 2013-08-11 DIAGNOSIS — E1149 Type 2 diabetes mellitus with other diabetic neurological complication: Secondary | ICD-10-CM | POA: Diagnosis not present

## 2013-08-11 DIAGNOSIS — E1142 Type 2 diabetes mellitus with diabetic polyneuropathy: Secondary | ICD-10-CM | POA: Diagnosis not present

## 2013-08-23 DIAGNOSIS — L84 Corns and callosities: Secondary | ICD-10-CM | POA: Diagnosis not present

## 2013-08-23 DIAGNOSIS — M6750 Plica syndrome, unspecified knee: Secondary | ICD-10-CM | POA: Diagnosis not present

## 2013-08-23 DIAGNOSIS — M25579 Pain in unspecified ankle and joints of unspecified foot: Secondary | ICD-10-CM | POA: Diagnosis not present

## 2013-08-24 ENCOUNTER — Ambulatory Visit: Payer: Medicare Other | Admitting: Gastroenterology

## 2013-08-24 DIAGNOSIS — M79609 Pain in unspecified limb: Secondary | ICD-10-CM | POA: Diagnosis not present

## 2013-08-24 DIAGNOSIS — L84 Corns and callosities: Secondary | ICD-10-CM | POA: Diagnosis not present

## 2013-08-24 DIAGNOSIS — M21619 Bunion of unspecified foot: Secondary | ICD-10-CM | POA: Diagnosis not present

## 2013-08-30 DIAGNOSIS — E119 Type 2 diabetes mellitus without complications: Secondary | ICD-10-CM | POA: Diagnosis present

## 2013-08-30 DIAGNOSIS — Z7982 Long term (current) use of aspirin: Secondary | ICD-10-CM | POA: Diagnosis not present

## 2013-08-30 DIAGNOSIS — K219 Gastro-esophageal reflux disease without esophagitis: Secondary | ICD-10-CM | POA: Diagnosis present

## 2013-08-30 DIAGNOSIS — Z7902 Long term (current) use of antithrombotics/antiplatelets: Secondary | ICD-10-CM | POA: Diagnosis not present

## 2013-08-30 DIAGNOSIS — Z833 Family history of diabetes mellitus: Secondary | ICD-10-CM | POA: Diagnosis not present

## 2013-08-30 DIAGNOSIS — S025XXA Fracture of tooth (traumatic), initial encounter for closed fracture: Secondary | ICD-10-CM | POA: Diagnosis not present

## 2013-08-30 DIAGNOSIS — M461 Sacroiliitis, not elsewhere classified: Secondary | ICD-10-CM | POA: Diagnosis not present

## 2013-08-30 DIAGNOSIS — I2510 Atherosclerotic heart disease of native coronary artery without angina pectoris: Secondary | ICD-10-CM | POA: Diagnosis present

## 2013-08-30 DIAGNOSIS — I509 Heart failure, unspecified: Secondary | ICD-10-CM | POA: Diagnosis present

## 2013-08-30 DIAGNOSIS — I10 Essential (primary) hypertension: Secondary | ICD-10-CM | POA: Diagnosis present

## 2013-08-30 DIAGNOSIS — Z79899 Other long term (current) drug therapy: Secondary | ICD-10-CM | POA: Diagnosis not present

## 2013-08-31 DIAGNOSIS — I10 Essential (primary) hypertension: Secondary | ICD-10-CM | POA: Diagnosis not present

## 2013-08-31 DIAGNOSIS — E119 Type 2 diabetes mellitus without complications: Secondary | ICD-10-CM | POA: Diagnosis not present

## 2013-08-31 DIAGNOSIS — K219 Gastro-esophageal reflux disease without esophagitis: Secondary | ICD-10-CM | POA: Diagnosis not present

## 2013-08-31 DIAGNOSIS — I509 Heart failure, unspecified: Secondary | ICD-10-CM | POA: Diagnosis not present

## 2013-08-31 DIAGNOSIS — M461 Sacroiliitis, not elsewhere classified: Secondary | ICD-10-CM | POA: Diagnosis not present

## 2013-08-31 DIAGNOSIS — I2510 Atherosclerotic heart disease of native coronary artery without angina pectoris: Secondary | ICD-10-CM | POA: Diagnosis not present

## 2013-09-10 DIAGNOSIS — I10 Essential (primary) hypertension: Secondary | ICD-10-CM | POA: Diagnosis not present

## 2013-09-14 DIAGNOSIS — Z9181 History of falling: Secondary | ICD-10-CM | POA: Diagnosis not present

## 2013-09-14 DIAGNOSIS — R269 Unspecified abnormalities of gait and mobility: Secondary | ICD-10-CM | POA: Diagnosis not present

## 2013-09-14 DIAGNOSIS — M461 Sacroiliitis, not elsewhere classified: Secondary | ICD-10-CM | POA: Diagnosis not present

## 2013-09-14 DIAGNOSIS — IMO0001 Reserved for inherently not codable concepts without codable children: Secondary | ICD-10-CM | POA: Diagnosis not present

## 2013-09-20 ENCOUNTER — Telehealth: Payer: Self-pay | Admitting: Gastroenterology

## 2013-09-21 ENCOUNTER — Encounter: Payer: Self-pay | Admitting: Gastroenterology

## 2013-09-21 ENCOUNTER — Ambulatory Visit (INDEPENDENT_AMBULATORY_CARE_PROVIDER_SITE_OTHER): Payer: Medicare Other | Admitting: Gastroenterology

## 2013-09-21 ENCOUNTER — Other Ambulatory Visit (INDEPENDENT_AMBULATORY_CARE_PROVIDER_SITE_OTHER): Payer: Medicare Other

## 2013-09-21 VITALS — BP 122/74 | HR 80 | Ht 68.0 in | Wt 212.4 lb

## 2013-09-21 DIAGNOSIS — K219 Gastro-esophageal reflux disease without esophagitis: Secondary | ICD-10-CM

## 2013-09-21 DIAGNOSIS — R1310 Dysphagia, unspecified: Secondary | ICD-10-CM

## 2013-09-21 DIAGNOSIS — Q453 Other congenital malformations of pancreas and pancreatic duct: Secondary | ICD-10-CM

## 2013-09-21 DIAGNOSIS — Z7901 Long term (current) use of anticoagulants: Secondary | ICD-10-CM

## 2013-09-21 DIAGNOSIS — R109 Unspecified abdominal pain: Secondary | ICD-10-CM

## 2013-09-21 DIAGNOSIS — K5730 Diverticulosis of large intestine without perforation or abscess without bleeding: Secondary | ICD-10-CM

## 2013-09-21 LAB — HEPATIC FUNCTION PANEL
ALT: 10 U/L (ref 0–35)
AST: 13 U/L (ref 0–37)
Albumin: 3.8 g/dL (ref 3.5–5.2)
Alkaline Phosphatase: 100 U/L (ref 39–117)
BILIRUBIN DIRECT: 0.1 mg/dL (ref 0.0–0.3)
BILIRUBIN TOTAL: 0.6 mg/dL (ref 0.3–1.2)
Total Protein: 8.2 g/dL (ref 6.0–8.3)

## 2013-09-21 LAB — BASIC METABOLIC PANEL
BUN: 12 mg/dL (ref 6–23)
CALCIUM: 9.1 mg/dL (ref 8.4–10.5)
CO2: 32 mEq/L (ref 19–32)
CREATININE: 0.7 mg/dL (ref 0.4–1.2)
Chloride: 97 mEq/L (ref 96–112)
GFR: 86.13 mL/min (ref >60.00)
Glucose, Bld: 91 mg/dL (ref 70–99)
Potassium: 4 mEq/L (ref 3.5–5.1)
Sodium: 138 mEq/L (ref 135–145)

## 2013-09-21 LAB — CBC WITH DIFFERENTIAL/PLATELET
BASOS ABS: 0 10*3/uL (ref 0.0–0.1)
Basophils Relative: 0.3 % (ref 0.0–3.0)
EOS ABS: 0.3 10*3/uL (ref 0.0–0.7)
Eosinophils Relative: 2.8 % (ref 0.0–5.0)
HEMATOCRIT: 36.5 % (ref 36.0–46.0)
HEMOGLOBIN: 12.4 g/dL (ref 12.0–15.0)
LYMPHS ABS: 2.2 10*3/uL (ref 0.7–4.0)
Lymphocytes Relative: 22 % (ref 12.0–46.0)
MCHC: 34.1 g/dL (ref 30.0–36.0)
MCV: 84.6 fl (ref 78.0–100.0)
Monocytes Absolute: 1.2 10*3/uL — ABNORMAL HIGH (ref 0.1–1.0)
Monocytes Relative: 12.6 % — ABNORMAL HIGH (ref 3.0–12.0)
NEUTROS ABS: 6.2 10*3/uL (ref 1.4–7.7)
Neutrophils Relative %: 62.3 % (ref 43.0–77.0)
Platelets: 287 10*3/uL (ref 150.0–400.0)
RBC: 4.31 Mil/uL (ref 3.87–5.11)
RDW: 13.4 % (ref 11.5–14.6)
WBC: 9.9 10*3/uL (ref 4.5–10.5)

## 2013-09-21 LAB — IBC PANEL
Iron: 37 ug/dL — ABNORMAL LOW (ref 42–145)
Saturation Ratios: 10.3 % — ABNORMAL LOW (ref 20.0–50.0)
Transferrin: 256.7 mg/dL (ref 212.0–360.0)

## 2013-09-21 NOTE — Patient Instructions (Signed)
You have been scheduled for an abdominal ultrasound at [facility] (1st floor of hospital) on 09-27-2013 at 830 am. Please arrive 15 minutes prior to your appointment for registration. Make certain not to have anything to eat or drink 6 hours prior to your appointment. Should you need to reschedule your appointment, please contact radiology at [phone]. This test typically takes about 30 minutes to perform.  Your physician has requested that you go to the basement for the following lab work before leaving today: Anemia Panel  CBC  TSH BMP Liver Function Panel   We have given you samples of the following medication to take: Dexilant 60 mg

## 2013-09-21 NOTE — Telephone Encounter (Signed)
lmom for pt to call back

## 2013-09-21 NOTE — Progress Notes (Signed)
This is a somewhat complicated 73 year old Caucasian female with pancreas of these him on chronic pancreatic extract therapy.  She also has a history of cardiac arrhythmias and is chronically on Plavix.  She has a long history of acid reflux with peptic stricture of her esophagus with endoscopic dilation in August of 2013.  She quit taking her PPI has had recurrence of her reflux symptoms and some intermittent solid food dysphagia.  She recently had some trauma to her face and lost her front teeth and has an unstable bridge which negates endoscopy at this time.  Otherwise she is doing well from a GI standpoint and denies diarrhea, steatorrhea, melena or hematochezia.  She has had some right upper quadrant pain which is been rather persistent since she took a fall 2 weeks ago.  She denies any specific genitourinary or hepatobiliary complaints.  Current Medications, Allergies, Past Medical History, Past Surgical History, Family History and Social History were reviewed in [employer] record.  ROS: All systems were reviewed and are negative unless otherwise stated in the HPI.   Allergies  Allergen Reactions  . Codeine Phosphate Swelling  . Lisinopril   . Statins     Intolerant   . Tramadol   . Iodine Swelling and Rash    REACTION: unspecified   Outpatient Prescriptions Prior to Visit  Medication Sig Dispense Refill  . aspirin 325 MG tablet Take 325 mg by mouth daily.      . carvedilol (COREG) 25 MG tablet Take 25 mg by mouth 2 (two) times daily with a meal.      . clopidogrel (PLAVIX) 75 MG tablet Take 75 mg by mouth daily.      . furosemide (LASIX) 40 MG tablet Take 2 tablets (80 mg total) by mouth 2 (two) times daily. 80mg  in the morning and 40 mg in the evening.  90 tablet  0  . gabapentin (NEURONTIN) 600 MG tablet Take 1,200 mg by mouth 3 (three) times daily.      . metolazone (ZAROXOLYN) 2.5 MG tablet Take one tablet every other day 30 minutes before Furosemide. Call  for an appointment for refills.  15 tablet  0  . nitroGLYCERIN (NITROSTAT) 0.4 MG SL tablet Place 0.4 mg under the tongue every 5 (five) minutes as needed for chest pain.      . Pancrelipase, Lip-Prot-Amyl, (ZENPEP) 20000 UNITS CPEP Take 2 capsules (40,000 Units total) by mouth 3 (three) times daily with meals.  180 capsule  6  . esomeprazole (NEXIUM) 40 MG capsule Take 1 capsule (40 mg total) by mouth daily before breakfast.  30 capsule  6  . hydrALAZINE (APRESOLINE) 50 MG tablet Take 50 mg by mouth 2 (two) times daily.      . isosorbide mononitrate (IMDUR) 60 MG 24 hr tablet Take 90 mg by mouth at bedtime.      . valsartan-hydrochlorothiazide (DIOVAN-HCT) 320-12.5 MG per tablet Take 1 tablet by mouth daily.       No facility-administered medications prior to visit.   Past Medical History  Diagnosis Date  . Pancreas divisum   . Irritable bowel syndrome   . Calculus of kidney   . Cardiac dysrhythmia, unspecified   . Unspecified gastritis and gastroduodenitis without mention of hemorrhage   . Stricture and stenosis of esophagus   . Diverticulitis of colon (without mention of hemorrhage)   . Esophageal reflux   . Hepatomegaly   . CHF (congestive heart failure)     06/13/10:  last  2D echo-  EF >55%, Mild TR,   . Hypertension   . Hiatal hernia   . Unspecified disorder of thyroid   . Back pain with radiation   . CAD (coronary artery disease)     LHC: 07/30/10.  3.0x48mm Integrity BMS to RCA and 2.5x 16mm Integrity BMS LAD.  03/2011: LHC- ISRS in LAD treated with overlapping Promus DES 2.5x52mm and PTCA of Diagonal.  02/03/12:  LHC- patent stents.  Jailed diagonal.  . Renal artery stenosis     Angiogram 02/03/12:  50-60% left stenosis, 40% stenosis of the right inferior artery..  Last renal dopplers, 12/10/11: 60% left and 60% right renal artery stenosis.  Marland Kitchen Dyslipidemia    Past Surgical History  Procedure Laterality Date  . Cholecystectomy    . Minor papilla stent      Dr Pershing Proud  .  Knee surgery      left  . Foot surgery      right and left  . Hip surgery      left  . Breast ductal system excision      right  . Abdominal hysterectomy    . Hand surgery      left  . Spine surgery      tumor removed  . Coronary angioplasty with stent placement      x 3  . Cardiac catheterization  03/2012    LHC: 07/30/10.  3.0x9mm Integrity BMS to RCA and 2.5x 32mm Integrity BMS LAD.  03/2011: LHC- ISRS in LAD treated with overlapping Promus DES 2.5x56mm and PTCA of Diagonal.  02/03/12:  LHC- patent stents.  Jailed diagonal.  . Cardiovascular stress test      11/07/11:  Normal perfusion pattern.  EF 66% No wall motion abnormalities.    History   Social History  . Marital Status: Married    Spouse Name: N/A    Number of Children: 1  . Years of Education: N/A   Occupational History  . gift shop owner    Social History Main Topics  . Smoking status: Never Smoker   . Smokeless tobacco: Never Used  . Alcohol Use: No  . Drug Use: No  . Sexual Activity: None   Other Topics Concern  . None   Social History Narrative   Marrie   Family History  Problem Relation Age of Onset  . Cancer Father     mets  . Heart attack Brother   . Heart attack Brother   . Heart attack Father   . Stroke Mother   . Colitis Maternal Grandfather   . Diabetes Brother   . Colon cancer Neg Hx           PE: Healthy-appearing patient in no distress.  Blood pressure 122/74, pulse 80 and regular, and weight 212 with a BMI of 32.3.  She will not let me look in her mouth because of her traumatized teeth from a fall.  Her chest is generally clear and she is in a regular rhythm without murmurs gallops or rubs.  Her abdomen shows no organomegaly, masses or specific areas of tenderness although she relates deep tenderness in the right upper quadrant.  There is no CVA tenderness.  Peripheral extremities are unremarkable and mental status is normal.      Assessment and Plan: This patient has some  dysphagia but is a poor candidate at this time for endoscopy because of her dental difficulties.  I have started Dexilant 60 mg a day as antireflux medication  and we will see how her intermittent dysphagia does on regular PPI therapy.  She may need endoscopy and dilation in the future, and should also need adjustment in her anticoagulation medications for this procedure.  I've asked continue all other medications listed and reviewed.  She is up-to-date on her colonoscopy exams.  I have ordered CBC, liver profile, metabolic profile, and urinalysis.  I've asked to use Tylenol in conventional doses for pain.  Colonoscopy in June of 2010 was normal except for severe diverticulosis coli.

## 2013-09-21 NOTE — Telephone Encounter (Signed)
Pt just wanted to know of something to bring Dr Sharlett Iles today.

## 2013-09-22 LAB — VITAMIN B12: Vitamin B-12: 1139 pg/mL — ABNORMAL HIGH (ref 211–911)

## 2013-09-22 LAB — FERRITIN: FERRITIN: 69.1 ng/mL (ref 10.0–291.0)

## 2013-09-22 LAB — FOLATE: FOLATE: 22 ng/mL (ref >5.9)

## 2013-09-22 LAB — TSH: TSH: 2.24 u[IU]/mL (ref 0.35–5.50)

## 2013-09-23 DIAGNOSIS — M48061 Spinal stenosis, lumbar region without neurogenic claudication: Secondary | ICD-10-CM | POA: Diagnosis not present

## 2013-09-23 DIAGNOSIS — M545 Low back pain, unspecified: Secondary | ICD-10-CM | POA: Diagnosis not present

## 2013-09-23 DIAGNOSIS — M461 Sacroiliitis, not elsewhere classified: Secondary | ICD-10-CM | POA: Diagnosis not present

## 2013-09-23 DIAGNOSIS — M25569 Pain in unspecified knee: Secondary | ICD-10-CM | POA: Diagnosis not present

## 2013-09-23 DIAGNOSIS — M961 Postlaminectomy syndrome, not elsewhere classified: Secondary | ICD-10-CM | POA: Diagnosis not present

## 2013-09-27 ENCOUNTER — Ambulatory Visit (HOSPITAL_COMMUNITY)
Admission: RE | Admit: 2013-09-27 | Discharge: 2013-09-27 | Disposition: A | Discharge status: Home / Self Care | Payer: Medicare Other | Source: Ambulatory Visit | Attending: Gastroenterology | Admitting: Gastroenterology

## 2013-09-27 DIAGNOSIS — K7689 Other specified diseases of liver: Secondary | ICD-10-CM | POA: Diagnosis not present

## 2013-09-27 DIAGNOSIS — Z9089 Acquired absence of other organs: Secondary | ICD-10-CM | POA: Insufficient documentation

## 2013-09-27 DIAGNOSIS — R109 Unspecified abdominal pain: Secondary | ICD-10-CM | POA: Insufficient documentation

## 2013-09-28 DIAGNOSIS — Z9181 History of falling: Secondary | ICD-10-CM | POA: Diagnosis not present

## 2013-09-28 DIAGNOSIS — M461 Sacroiliitis, not elsewhere classified: Secondary | ICD-10-CM | POA: Diagnosis not present

## 2013-09-28 DIAGNOSIS — R269 Unspecified abnormalities of gait and mobility: Secondary | ICD-10-CM | POA: Diagnosis not present

## 2013-09-28 DIAGNOSIS — IMO0001 Reserved for inherently not codable concepts without codable children: Secondary | ICD-10-CM | POA: Diagnosis not present

## 2013-09-30 DIAGNOSIS — M461 Sacroiliitis, not elsewhere classified: Secondary | ICD-10-CM | POA: Diagnosis not present

## 2013-09-30 DIAGNOSIS — IMO0001 Reserved for inherently not codable concepts without codable children: Secondary | ICD-10-CM | POA: Diagnosis not present

## 2013-09-30 DIAGNOSIS — R269 Unspecified abnormalities of gait and mobility: Secondary | ICD-10-CM | POA: Diagnosis not present

## 2013-09-30 DIAGNOSIS — Z9181 History of falling: Secondary | ICD-10-CM | POA: Diagnosis not present

## 2013-10-15 ENCOUNTER — Other Ambulatory Visit (HOSPITAL_COMMUNITY): Payer: Self-pay | Admitting: Cardiology

## 2013-10-15 DIAGNOSIS — I701 Atherosclerosis of renal artery: Secondary | ICD-10-CM

## 2013-10-20 ENCOUNTER — Encounter (HOSPITAL_COMMUNITY): Payer: Medicare Other

## 2013-10-27 ENCOUNTER — Encounter (HOSPITAL_COMMUNITY): Payer: Medicare Other

## 2013-11-11 DIAGNOSIS — E669 Obesity, unspecified: Secondary | ICD-10-CM | POA: Diagnosis not present

## 2013-11-11 DIAGNOSIS — E1129 Type 2 diabetes mellitus with other diabetic kidney complication: Secondary | ICD-10-CM | POA: Diagnosis not present

## 2013-11-11 DIAGNOSIS — G609 Hereditary and idiopathic neuropathy, unspecified: Secondary | ICD-10-CM | POA: Diagnosis not present

## 2013-11-11 DIAGNOSIS — R809 Proteinuria, unspecified: Secondary | ICD-10-CM | POA: Diagnosis not present

## 2013-11-11 DIAGNOSIS — E1149 Type 2 diabetes mellitus with other diabetic neurological complication: Secondary | ICD-10-CM | POA: Diagnosis not present

## 2013-11-11 DIAGNOSIS — E1142 Type 2 diabetes mellitus with diabetic polyneuropathy: Secondary | ICD-10-CM | POA: Diagnosis not present

## 2013-11-11 DIAGNOSIS — E559 Vitamin D deficiency, unspecified: Secondary | ICD-10-CM | POA: Diagnosis not present

## 2013-11-11 DIAGNOSIS — I10 Essential (primary) hypertension: Secondary | ICD-10-CM | POA: Diagnosis not present

## 2013-11-12 DIAGNOSIS — E559 Vitamin D deficiency, unspecified: Secondary | ICD-10-CM | POA: Diagnosis not present

## 2013-11-12 DIAGNOSIS — E1129 Type 2 diabetes mellitus with other diabetic kidney complication: Secondary | ICD-10-CM | POA: Diagnosis not present

## 2013-11-16 DIAGNOSIS — E1142 Type 2 diabetes mellitus with diabetic polyneuropathy: Secondary | ICD-10-CM | POA: Diagnosis not present

## 2013-11-16 DIAGNOSIS — M21619 Bunion of unspecified foot: Secondary | ICD-10-CM | POA: Diagnosis not present

## 2013-11-16 DIAGNOSIS — E1149 Type 2 diabetes mellitus with other diabetic neurological complication: Secondary | ICD-10-CM | POA: Diagnosis not present

## 2013-11-16 DIAGNOSIS — M7750 Other enthesopathy of unspecified foot: Secondary | ICD-10-CM | POA: Diagnosis not present

## 2013-11-16 DIAGNOSIS — B351 Tinea unguium: Secondary | ICD-10-CM | POA: Diagnosis not present

## 2013-11-16 DIAGNOSIS — M2040 Other hammer toe(s) (acquired), unspecified foot: Secondary | ICD-10-CM | POA: Diagnosis not present

## 2013-11-22 ENCOUNTER — Other Ambulatory Visit: Payer: Self-pay

## 2013-11-22 ENCOUNTER — Ambulatory Visit (HOSPITAL_COMMUNITY)
Admission: RE | Admit: 2013-11-22 | Discharge: 2013-11-22 | Disposition: A | Discharge status: Home / Self Care | Payer: Medicare Other | Source: Ambulatory Visit | Attending: Cardiovascular Disease | Admitting: Cardiovascular Disease

## 2013-11-22 DIAGNOSIS — I10 Essential (primary) hypertension: Secondary | ICD-10-CM

## 2013-11-22 DIAGNOSIS — I701 Atherosclerosis of renal artery: Secondary | ICD-10-CM | POA: Insufficient documentation

## 2013-11-22 MED ORDER — FUROSEMIDE 40 MG PO TABS: ORAL_TABLET | ORAL | Status: DC

## 2013-11-22 NOTE — Telephone Encounter (Signed)
Rx was sent to pharmacy electronically. 

## 2013-11-22 NOTE — Progress Notes (Signed)
Renal Duplex Completed. Carsyn Boster, BS, RDMS, RVT  

## 2013-11-27 DIAGNOSIS — I771 Stricture of artery: Secondary | ICD-10-CM | POA: Insufficient documentation

## 2013-11-27 DIAGNOSIS — I774 Celiac artery compression syndrome: Secondary | ICD-10-CM | POA: Insufficient documentation

## 2013-11-28 NOTE — Progress Notes (Signed)
Quick Note:  Recent L Renal stent looks patent without significant obstructive re-stenosis. Both kidneys appear normal in size. R renal A - ~50-60% stenosis Celiac & Superior Mesenteric Arteries (1st 2 branches of Aorta in Abdomen - leading to the liver, spleen & stomach as well as the intestines) - ~60% stenosis -- not yet "signifiant".  F/u in 6 months with another Ultrasound.  Leonie Man, MD  ______

## 2013-12-01 ENCOUNTER — Telehealth: Payer: Self-pay | Admitting: *Deleted

## 2013-12-01 NOTE — Telephone Encounter (Signed)
Spoke to patient. Result given . Verbalized understanding Recheck in 6 month ,order already placed  Patient states she needs an appointment with Dr Ellyn Hack.  Schedule appointment for 01/10/14 10:15 am.

## 2013-12-01 NOTE — Telephone Encounter (Signed)
Message copied by Raiford Simmonds on Wed Dec 01, 2013  5:52 PM ------      Message from: Ellyn Hack, DAVID W      Created: Sun Nov 28, 2013 12:14 AM       Recent L Renal stent looks patent without significant obstructive re-stenosis.      Both kidneys appear normal in size.      R renal A - ~50-60% stenosis      Celiac & Superior Mesenteric Arteries (1st 2 branches of Aorta in Abdomen - leading to the liver, spleen & stomach as well as the intestines) - ~60% stenosis -- not yet "signifiant".            F/u in 6 months with another Ultrasound.            Leonie Man, MD       ------

## 2013-12-16 ENCOUNTER — Other Ambulatory Visit: Payer: Self-pay

## 2013-12-16 MED ORDER — FUROSEMIDE 40 MG PO TABS: ORAL_TABLET | ORAL | Status: DC

## 2013-12-22 DIAGNOSIS — M545 Low back pain, unspecified: Secondary | ICD-10-CM | POA: Diagnosis not present

## 2013-12-22 DIAGNOSIS — M48061 Spinal stenosis, lumbar region without neurogenic claudication: Secondary | ICD-10-CM | POA: Diagnosis not present

## 2013-12-22 DIAGNOSIS — M461 Sacroiliitis, not elsewhere classified: Secondary | ICD-10-CM | POA: Diagnosis not present

## 2013-12-22 DIAGNOSIS — M961 Postlaminectomy syndrome, not elsewhere classified: Secondary | ICD-10-CM | POA: Diagnosis not present

## 2014-01-10 ENCOUNTER — Encounter: Payer: Self-pay | Admitting: Cardiology

## 2014-01-10 ENCOUNTER — Ambulatory Visit (INDEPENDENT_AMBULATORY_CARE_PROVIDER_SITE_OTHER): Payer: Medicare Other | Admitting: Cardiology

## 2014-01-10 VITALS — BP 167/68 | HR 85

## 2014-01-10 DIAGNOSIS — I2510 Atherosclerotic heart disease of native coronary artery without angina pectoris: Secondary | ICD-10-CM | POA: Diagnosis not present

## 2014-01-10 DIAGNOSIS — I10 Essential (primary) hypertension: Secondary | ICD-10-CM

## 2014-01-10 DIAGNOSIS — I219 Acute myocardial infarction, unspecified: Secondary | ICD-10-CM | POA: Diagnosis not present

## 2014-01-10 DIAGNOSIS — Z9861 Coronary angioplasty status: Secondary | ICD-10-CM

## 2014-01-10 DIAGNOSIS — I701 Atherosclerosis of renal artery: Secondary | ICD-10-CM | POA: Diagnosis not present

## 2014-01-10 DIAGNOSIS — E785 Hyperlipidemia, unspecified: Secondary | ICD-10-CM

## 2014-01-10 DIAGNOSIS — I87302 Chronic venous hypertension (idiopathic) without complications of left lower extremity: Secondary | ICD-10-CM

## 2014-01-10 DIAGNOSIS — I87309 Chronic venous hypertension (idiopathic) without complications of unspecified lower extremity: Secondary | ICD-10-CM

## 2014-01-10 DIAGNOSIS — I509 Heart failure, unspecified: Secondary | ICD-10-CM

## 2014-01-10 DIAGNOSIS — I5030 Unspecified diastolic (congestive) heart failure: Secondary | ICD-10-CM | POA: Diagnosis not present

## 2014-01-10 DIAGNOSIS — R002 Palpitations: Secondary | ICD-10-CM

## 2014-01-10 MED ORDER — CARVEDILOL 6.25 MG PO TABS: 6.2500 mg | ORAL_TABLET | Freq: Two times a day (BID) | ORAL | Status: DC

## 2014-01-10 NOTE — Patient Instructions (Signed)
Your physician recommends that you schedule a follow-up appointment in: [place] has recommended that you wear an event monitor. Event monitors are medical devices that record the heart's electrical activity. Doctors most often [place] these monitors to diagnose arrhythmias. Arrhythmias are problems with the speed or rhythm of the heartbeat. The monitor is a small, portable device. You can wear one while you do your normal daily activities. This is usually used to diagnose what is causing palpitations/syncope (passing out). 1 Months  Your physician has recommended you make the following change in your medication: Stop Metoprolol 25 mg, Stop Carvedilol 25 mg and Start Carvedilol 6.25 mg twice daily

## 2014-01-11 NOTE — Progress Notes (Signed)
PATIENT: [name] MRN: [id number]  DOB: 09-Jan-1941   DOV:01/12/2014 PCP: Yong Channel, MD  Clinic Note: Chief Complaint  Patient presents with  . Follow-up    last seen 02/2012, folow-up doppler, pt had back surgery Dec 30th, had an heart attack august 2013 after her first back surgery.   HPI: [name] is a 73 y.o.  female with a PMH below who presents today for with [facility]. She was a former patient of mine who I last saw in preoperative evaluation in June 2013 she has a history of coronary disease dating back to a preoperative evaluation for back surgery. She had a nuclear stress test but likely showed balanced ischemia, she was referred for cardiac catheterization due to continued symptoms.  On, she was found to have severe two-vessel disease with RCA and LA-Diag lesions treated with bare-metal stents in the interest of her upcoming surgery. Several months later she had redeveloped chest discomfort and underwent catheterization showing localized in-stent restenosis in the LAD stent treated with overlapping Promus DES stent and PTCA of the jailed diagonal branch. One year later she had a cardiac catheterization which showed patent stents and a widely patent diagonal branch. She continued to have symptoms that were thought to potentially be related to microvascular ischemia with an elevated EDP on cath of 20 mmHg. She was maintained on dual antiplatelet therapy for one year and then underwent redo back surgery by Dr. Patrice Paradise at [facility]. Apparently she had a postop MI and was taken to the catheterization lab where she had additional stents placed. She also had a stent placed in the Left Renal Artery -- we have been following her renal artery stenosis both with angiography and ultrasound, but they have not been significant.. I do not have those reports. After several followup visits with the cardiologist in [facility], she decided that she would prefer to return to our  clinic for her cardiology care. She is had yet again another surgery in December 2014 for sacroiliac joint problems.   Several of her medications have been adjusted since her visit with me years ago. She is no longer on carvedilol, and is on low-dose  Of metoprolol. She remains on aspirin Plavix.  She recently had renal Dopplers checked here -- see below.  Interval History:  she presents today sort of an her usual manner with the complaint of she still feels short of breath she occasionally notes some chest discomfort is not necessarily associated with it with any particular activity either rest or with exertion. She does exercise up to 2 times a week and does not note significant symptoms. Last night she had a long day he entertaining guests and then had a relatively rough night last night with several palpitations and a funny feeling in her chest -- she is had similar episodes every couple days or so for the last month or so. They can last up to 10 minutes at a time and can be associated with dizziness and wooziness but mostly was just a strange sensation in her chest.   She denies any significant PND, orthopnea, but does note some mild edema. She is relatively significant varicosities suggesting venous stasis. Besides the episodes last a palpitations, which have now increased in frequency, she denies any syncope or near syncope symptoms, TIA/amaurosis fugax symptoms. No melena, hematochezia, hematuria, or epistaxis. No claudication symptoms.  She does note occasionally having some cramping sensations after eating that she is always to be due GERD  type symptoms.  Past Medical History  Diagnosis Date  . CAD S/P percutaneous coronary angioplasty     LHC: 07/30/10. -- 3.0x30mm Integrity BMS to pRCA and 2.5x 29mm Integrity BMS mLAD(@ D2).  03/2011: LHC- ISR in LAD BMS -- proximal overlapping Promus DES 2.5x4mm and PTCA of jailed D2 ostium-prox 80%.  02/03/12:  LHC- patent stents.  Jailed diagonal.  .  Congestive heart failure with LV diastolic dysfunction, NYHA class 2     06/13/10:  last 2D echo-  EF >55%, Mild TR, Mod Conc LVH - Grade 1 diastolic dysfunction (abnormal relaxation) --> LVEDP on Cath 28 mmHg & mild 2nd Pulm HTN  . Renal artery stenosis 2011    Angiogram 02/03/12:  50-60% left stenosis, 40% Right inferior artery;; S/P L Renal Stent ([facility]) Last renal dopplers: <60% L RA, <60 R RA, ~60% SMA & Celiac A.  . Labile essential hypertension     Partially related to RAS  . Dyslipidemia     Intolerant to statins  . Hiatal hernia   . Unspecified gastritis and gastroduodenitis without mention of hemorrhage   . Stricture and stenosis of esophagus   . Irritable bowel syndrome (IBS)   . Diverticulitis of colon (without mention of hemorrhage)   . Esophageal reflux   . Hepatomegaly   . Pancreas divisum     On pancreatic enzyme  . Calculus of kidney   . Unspecified disorder of thyroid   . Back pain with radiation     Chronic Back Pain - mutliple surgeries (including tumor removal)  . Stasis edema of bilateral lower extremity     Chronic, likely related to venous stasis  . MI (myocardial infarction) 04/2012    Unclear the details, apparently this was postoperative from her back surgery that she was cleared for my last saw her in June.  Reportedly had stents placed   Prior Cardiac Evaluation and Past Surgical History: Past Surgical History  Procedure Laterality Date  . Cholecystectomy    . Minor papilla stent      Dr Pershing Proud  . Knee surgery      left  . Foot surgery      right and left  . Hip surgery      left  . Breast ductal system excision      right  . Abdominal hysterectomy    . Hand surgery      left  . Spine surgery      tumor removed 07/2010; Redo Surgery 04/2012; Sacroiliac Sgx 08/2013  . Coronary angioplasty with stent placement  07/2010, 02/2011    LHC: 07/30/10.  3.0x70mm Integrity BMS to RCA and 2.5x 79mm Integrity BMS LAD.  03/2011: LHC- ISRS in LAD  treated with overlapping Promus DES 2.5x22mm and PTCA of Diagonal.  02/03/12:  LHC- patent stents.  Jailed diagonal.  . Cardiac catheterization  03/2012    ~30% ISR RCA, patent LAD stent & D2; EDP ~28 mmHg  . Cardiovascular stress test      11/07/11:  Normal perfusion pattern.  EF 66% No wall motion abnormalities.   . Renal artery stent Left ~04/2012    @ [facility] - notes not available  . Percutaneous coronary stent intervention (pci-s)  ~04/2012    Peri-Op MI @ [facility] -- notes not available    Allergies  Allergen Reactions  . Codeine Phosphate Swelling  . Lisinopril   . Statins     Intolerant   . Tramadol   .  Iodine Swelling and Rash    REACTION: unspecified    Current Outpatient Prescriptions  Medication Sig Dispense Refill  . aspirin 325 MG tablet Take 325 mg by mouth daily.      . clopidogrel (PLAVIX) 75 MG tablet Take 75 mg by mouth daily.      . furosemide (LASIX) 40 MG tablet Take 80mg  in the morning and 40 mg in the evening.  45 tablet  0  . gabapentin (NEURONTIN) 600 MG tablet Take 1,200 mg by mouth 3 (three) times daily.      Marland Kitchen losartan (COZAAR) 100 MG tablet Take 1 tablet by mouth daily.      . metolazone (ZAROXOLYN) 2.5 MG tablet Take one tablet every other day 30 minutes before Furosemide. Call for an appointment for refills.  15 tablet  0  . nitroGLYCERIN (NITROSTAT) 0.4 MG SL tablet Place 0.4 mg under the tongue every 5 (five) minutes as needed for chest pain.      . Pancrelipase, Lip-Prot-Amyl, (ZENPEP) 20000 UNITS CPEP Take 2 capsules (40,000 Units total) by mouth 3 (three) times daily with meals.  180 capsule  6  .  metoprolol (LOPRESSOR) 25 MG tablet Take 1 tablet (25 mg total) by mouth 2 (two) times daily.  60 tablet  11   No current facility-administered medications for this visit.    History   Social History Narrative   Married mother of one, one grandchild.   Very socially active, enjoys cooking and having get-togethers her house. She had  been exercising regularly but her back pain limits her.   Does not smoke, does not drink alcohol.   FAMILY HISTORY:  Family History  Problem Relation Age of Onset  . Cancer Father     mets  . Heart attack Brother   . Heart attack Brother   . Heart attack Father   . Stroke Mother   . Colitis Maternal Grandfather   . Diabetes Brother   . Colon cancer Neg Hx     ROS: A comprehensive Review of Systems - Pertinent positives were noted above, otherwise Review is negative besides her chronic back and joint pains.  PHYSICAL EXAM BP 167/68  Pulse 85 General appearance: alert, cooperative, appears stated age, no distress and mildly obese HEENT: [place]/AT, EOMI, MMM, anicteric sclera Neck: no adenopathy, no carotid bruit, no JVD and supple, symmetrical, trachea midline Lungs: clear to auscultation bilaterally, normal percussion bilaterally and Nonlabored, good air movement Heart: RRR, normal S1 and S2 with an S4 gallop. 1/6 SEM at RUSB --> carotids. Nondisplaced PMI. No rubs Abdomen: soft, non-tender; bowel sounds normal; no masses,  no organomegaly Extremities: no clubbing or cyanosis but with 1+ bilateral edema with varicosities. Pulses: 2+ and symmetric Neurologic: Mental status: Alert, oriented, thought content appropriate Cranial nerves: normal; normal strength    Adult ECG Report  Rate: 85  ;  Rhythm: normal sinus rhythm; normal axis and intervals; no conduction delays; normal voltage   Other Abnormalities: Mild nonspecific ST and T wave abnormality is in the inferior and anterolateral walls (consider ischemia)  Narrative Interpretation:  overall stable EKG from 2013   Recent Labs:  none available   ASSESSMENT / PLAN: MI (myocardial infarction) Unfortunately, I do not have the details of what was done. We are in the process of trying to get her reports from [facility]. She was unable to tell me much in the way of any details. I am unsure what her lesion was, and more  importantly what  the residual effect was.  CAD S/P percutaneous coronary angioplasty 2 bare-metal stents with overlapping DES stent by me at [facility], as well as whatever stents were placed at [facility]. She remains on aspirin plus Plavix. She is on beta blocker and ARB. She is intolerant to statins and is not on any lipid agent.  Congestive heart failure with LV diastolic dysfunction, NYHA class 2 She clearly has had symptoms of diastolic heart failure and evidence of significantly elevated EDP and catheterization in the past. The mainstay of treatment here is blood pressure control and standing diuretic.  Plan. Continue with Lasix and metolazone. Will convert back to carvedilol from Lopressor. We will start at 625 twice a day and likely titrate up at next visit. She is on max dose of Cozaar - which may need to be converted to a more potent ARB (was previously on Diovan).  Labile essential hypertension I am not sure how much renal artery stenosis is playing a role here, Doppler studies did not show significant stenoses. She barely had left renal artery stenting done but still has a less than 60% stenosis which was the same as before. Medication adjustments as noted above. Will consider using hydralazine/nitrate combination or additional afterload reduction if her blood pressure still remains elevated after titration back up carvedilol.  Renal artery stenosis Following every six-month Dopplers. We'll need to get results of the renal artery stent placed at [facility].  Palpitation A not exactly sure what these palpitation episodes she is having are. She does have significant anxiety. Since they're happening relatively frequently and they have been tried for just a couple weeks to monitor. This will hopefully find out if his arrhythmias. We'll bring her back in 6 weeks after she she has a monitor her pressure and discuss results of the monitor  Dyslipidemia This was a major  issue last time I saw her as well. We don't have many options since she is so adamant about not taking statins. Hopefully with a new PSK9 medications come out we will be able to use that.  We'll discuss this further in followup visits. I may need to refer her to our lipid clinic for further assistance.    Orders Placed This Encounter  Procedures  . EKG 12-Lead  . Cardiac event monitor   Meds ordered this encounter  Medications  . DISCONTD: metoprolol tartrate (LOPRESSOR) 25 MG tablet    Sig: Take 1 tablet by mouth daily.  . carvedilol (COREG) 6.25 MG tablet    Sig: Take 1 tablet (6.25 mg total) by mouth 2 (two) times daily.    Dispense:  60 tablet    Refill:  11    Followup:  6 weeks  Chantea Surace W. Ellyn Hack, M.D., M.S. Interventional Cardiology [facility]

## 2014-01-12 ENCOUNTER — Encounter: Payer: Self-pay | Admitting: Cardiology

## 2014-01-12 DIAGNOSIS — R0989 Other specified symptoms and signs involving the circulatory and respiratory systems: Secondary | ICD-10-CM | POA: Insufficient documentation

## 2014-01-12 DIAGNOSIS — I10 Essential (primary) hypertension: Secondary | ICD-10-CM | POA: Insufficient documentation

## 2014-01-12 DIAGNOSIS — I87302 Chronic venous hypertension (idiopathic) without complications of left lower extremity: Secondary | ICD-10-CM | POA: Insufficient documentation

## 2014-01-12 DIAGNOSIS — R002 Palpitations: Secondary | ICD-10-CM | POA: Insufficient documentation

## 2014-01-12 DIAGNOSIS — I5030 Unspecified diastolic (congestive) heart failure: Secondary | ICD-10-CM | POA: Insufficient documentation

## 2014-01-12 DIAGNOSIS — E785 Hyperlipidemia, unspecified: Secondary | ICD-10-CM | POA: Insufficient documentation

## 2014-01-12 DIAGNOSIS — E1169 Type 2 diabetes mellitus with other specified complication: Secondary | ICD-10-CM | POA: Insufficient documentation

## 2014-01-12 NOTE — Assessment & Plan Note (Signed)
A not exactly sure what these palpitation episodes she is having are. She does have significant anxiety. Since they're happening relatively frequently and they have been tried for just a couple weeks to monitor. This will hopefully find out if his arrhythmias. We'll bring her back in 6 weeks after she she has a monitor her pressure and discuss results of the monitor

## 2014-01-12 NOTE — Assessment & Plan Note (Signed)
2 bare-metal stents with overlapping DES stent by me at [facility], as well as whatever stents were placed at [facility]. She remains on aspirin plus Plavix. She is on beta blocker and ARB. She is intolerant to statins and is not on any lipid agent.

## 2014-01-12 NOTE — Assessment & Plan Note (Signed)
I am not sure how much renal artery stenosis is playing a role here, Doppler studies did not show significant stenoses. She barely had left renal artery stenting done but still has a less than 60% stenosis which was the same as before. Medication adjustments as noted above. Will consider using hydralazine/nitrate combination or additional afterload reduction if her blood pressure still remains elevated after titration back up carvedilol.

## 2014-01-12 NOTE — Assessment & Plan Note (Addendum)
She clearly has had symptoms of diastolic heart failure and evidence of significantly elevated EDP and catheterization in the past. The mainstay of treatment here is blood pressure control and standing diuretic.  Plan. Continue with Lasix and metolazone. Will convert back to carvedilol from Lopressor. We will start at 625 twice a day and likely titrate up at next visit. She is on max dose of Cozaar - which may need to be converted to a more potent ARB (was previously on Diovan).

## 2014-01-12 NOTE — Assessment & Plan Note (Signed)
Following every six-month Dopplers. We'll need to get results of the renal artery stent placed at [facility].

## 2014-01-12 NOTE — Assessment & Plan Note (Signed)
Unfortunately, I do not have the details of what was done. We are in the process of trying to get her reports from [facility]. She was unable to tell me much in the way of any details. I am unsure what her lesion was, and more importantly what the residual effect was.

## 2014-01-12 NOTE — Assessment & Plan Note (Addendum)
This was a major issue last time I saw her as well. We don't have many options since she is so adamant about not taking statins. Hopefully with a new PSK9 medications come out we will be able to use that.  We'll discuss this further in followup visits. I may need to refer her to our lipid clinic for further assistance.

## 2014-01-13 ENCOUNTER — Other Ambulatory Visit: Payer: Self-pay | Admitting: *Deleted

## 2014-01-13 ENCOUNTER — Other Ambulatory Visit: Payer: Self-pay

## 2014-01-13 MED ORDER — FUROSEMIDE 40 MG PO TABS: ORAL_TABLET | ORAL | Status: DC

## 2014-01-13 NOTE — Telephone Encounter (Signed)
Rx was sent to pharmacy electronically. 

## 2014-01-17 ENCOUNTER — Telehealth: Payer: Self-pay | Admitting: Gastroenterology

## 2014-01-17 ENCOUNTER — Telehealth: Payer: Self-pay | Admitting: Cardiology

## 2014-01-17 NOTE — Telephone Encounter (Signed)
Left a message for patient to call back. 

## 2014-01-17 NOTE — Telephone Encounter (Signed)
She saw the doctor about 2 weeks ago and she was not clear on something he said. Pt have been very sick for the last couple of days. He would like to discus her condition.

## 2014-01-17 NOTE — Telephone Encounter (Signed)
Spoke to husband.He was concerned because patient told him that she had blockage in her aorta about 60%. And she has been having abd pain as well las leg cramps. She is also wearing heart monitor.  RN reviewed last office note. RN informed husband that renal artery stenosis is 60% blocked but that it is not new. He stated that she got it mixed up. He also states the monitor company calls and states no reception.  RN  Takeyla Million Majestic to the website  Was able to see 1 transmission. Husband states the patient has not done transmission. RN strongly encourage to SEND A TRANSMISSION WITH TYPE OF ISSUE- he voiced understanding.

## 2014-01-17 NOTE — Telephone Encounter (Signed)
Left message to call back  

## 2014-01-18 NOTE — Telephone Encounter (Signed)
Patient does not have a preference for new GI MD. She reports over the weekend, she has started having abdominal pain above the belly button. The pain is a sharp, cramping feeling. She reports she had diarrhea on Saturday but this is not unusual for her. Denies bleeding. She does have nausea without vomiting. Scheduled with Alonza Bogus, PA on 01/19/14 at 2:00 PM.

## 2014-01-18 NOTE — Telephone Encounter (Signed)
Left a message for patient to call back. 

## 2014-01-19 ENCOUNTER — Other Ambulatory Visit (INDEPENDENT_AMBULATORY_CARE_PROVIDER_SITE_OTHER): Payer: Medicare Other

## 2014-01-19 ENCOUNTER — Ambulatory Visit (INDEPENDENT_AMBULATORY_CARE_PROVIDER_SITE_OTHER): Payer: Medicare Other | Admitting: Gastroenterology

## 2014-01-19 ENCOUNTER — Encounter: Payer: Self-pay | Admitting: Gastroenterology

## 2014-01-19 VITALS — BP 128/68 | HR 80 | Ht 68.0 in | Wt 213.2 lb

## 2014-01-19 DIAGNOSIS — Z8719 Personal history of other diseases of the digestive system: Secondary | ICD-10-CM | POA: Diagnosis not present

## 2014-01-19 DIAGNOSIS — I701 Atherosclerosis of renal artery: Secondary | ICD-10-CM

## 2014-01-19 DIAGNOSIS — R1013 Epigastric pain: Secondary | ICD-10-CM | POA: Diagnosis not present

## 2014-01-19 DIAGNOSIS — R252 Cramp and spasm: Secondary | ICD-10-CM

## 2014-01-19 LAB — CBC
HCT: 36.8 % (ref 36.0–46.0)
HEMOGLOBIN: 12.6 g/dL (ref 12.0–15.0)
MCHC: 34.2 g/dL (ref 30.0–36.0)
MCV: 84.3 fl (ref 78.0–100.0)
Platelets: 283 10*3/uL (ref 150.0–400.0)
RBC: 4.37 Mil/uL (ref 3.87–5.11)
RDW: 13.8 % (ref 11.5–15.5)
WBC: 9.6 10*3/uL (ref 4.0–10.5)

## 2014-01-19 LAB — COMPREHENSIVE METABOLIC PANEL
ALT: 16 U/L (ref 0–35)
AST: 17 U/L (ref 0–37)
Albumin: 4 g/dL (ref 3.5–5.2)
Alkaline Phosphatase: 70 U/L (ref 39–117)
BUN: 21 mg/dL (ref 6–23)
CALCIUM: 9.6 mg/dL (ref 8.4–10.5)
CHLORIDE: 89 meq/L — AB (ref 96–112)
CO2: 32 mEq/L (ref 19–32)
Creatinine, Ser: 0.9 mg/dL (ref 0.4–1.2)
GFR: 67.17 mL/min (ref >60.00)
Glucose, Bld: 95 mg/dL (ref 70–99)
Potassium: 2.9 mEq/L — ABNORMAL LOW (ref 3.5–5.1)
Sodium: 132 mEq/L — ABNORMAL LOW (ref 135–145)
Total Bilirubin: 0.5 mg/dL (ref 0.2–1.2)
Total Protein: 7.5 g/dL (ref 6.0–8.3)

## 2014-01-19 LAB — LIPASE: Lipase: 24 U/L (ref 11.0–59.0)

## 2014-01-19 NOTE — Patient Instructions (Signed)
You have been scheduled for a CT scan of the abdomen at [facility] ([address]---this is in the same building as Press [facility]).   You are scheduled on 01-21-2014 at 2 pm. You should arrive 15 minutes prior to your appointment time for registration. Please follow the written instructions below on the day of your exam:  WARNING: IF YOU ARE ALLERGIC TO IODINE/X-RAY DYE, PLEASE NOTIFY RADIOLOGY IMMEDIATELY AT [phone]! YOU WILL BE GIVEN A 13 HOUR PREMEDICATION PREP.  1) Do not eat or drink anything after 10 am (4 hours prior to your test) 2) You have been given 2 bottles of oral contrast to drink. The solution may taste better if refrigerated, but do NOT add ice or any other liquid to this solution. Shake well before drinking.    Drink 1 bottle of contrast @ 12 pm (2 hours prior to your exam)   You may take any medications as prescribed with a small amount of water except for the following: Metformin, Glucophage, Glucovance, Avandamet, Riomet, Fortamet, Actoplus Met, Janumet, Glumetza or Metaglip. The above medications must be held the day of the exam AND 48 hours after the exam.  The purpose of you drinking the oral contrast is to aid in the visualization of your intestinal tract. The contrast solution may cause some diarrhea. Before your exam is started, you will be given a small amount of fluid to drink. Depending on your individual set of symptoms, you may also receive an intravenous injection of x-ray contrast/dye. Plan on being at [facility] for 30 minutes or long, depending on the type of exam you are having performed.  This test typically takes 30-45 minutes to complete.  If you have any questions regarding your exam or if you need to reschedule, you may call the CT department at [phone] between the hours of 8:00 am and 5:00 pm, Monday-Friday.  ________________________________________________________________________  Your physician has requested that  you go to the basement for the following lab work before leaving today: CBC CMET Lipase

## 2014-01-19 NOTE — Progress Notes (Addendum)
01/19/2014 [name] [id number] 1940/10/25   History of Present Illness:  This is a 73 year old female who is known to Dr. Sharlett Iles previously.  She has multiple medical problems including coronary artery disease with stent placement requiring antiplatelet therapy, renal artery stenosis with renal artery stent in place, hypertension, hyperlipidemia. She also has a complicated GI history as well. She apparently has a pancreatic divisum which caused her multiple issues in the past and she had to have a minor papilla stent placed previously. She's been maintained on pancreatic enzyme therapy for those issues. She underwent a cholecystectomy in the past for issues with gallstones. She's a history of reflux with hiatal hernia and previous stricture of the esophagus. She's not currently on PPI therapy. Last EGD in August 2013 showed gastritis and a hiatal hernia. Her last colonoscopy was in June 2010 at which time she had severe diverticulosis in the sigmoid colon and hypertrophied anal papillae, but was otherwise normal. Random biopsies throughout the colon at that time were benign.  She presents to our office today with complaints of epigastric abdominal pain that began suddenly on Friday and went through to her back. She states that she had some diarrhea on Saturday, which resolved, but the pain continued. She is feeling somewhat better finally today but still has some pain and she has not even anything today. The pain does worsen with eating. She also reports that she had severe cramps in her hands/fingers and all the way up her legs as well.  She had a renal artery duplex performed in March of this year and was found to have celiac and SMA stenosis of approximately 60%. She was questioning if that could have any significance towards her abdominal pain.   Current Medications, Allergies, Past Medical History, Past Surgical History, Family History and Social History were reviewed in [employer] record.   Physical Exam: BP 128/68  Pulse 80  Ht 5\' 8"  (1.727 m)  Wt 213 lb 3.2 oz (96.707 kg)  BMI 32.42 kg/m2 General: Well developed white female in no acute distress Head: Normocephalic and atraumatic Eyes:  Sclerae anicteric, conjunctiva pink  Ears: Normal auditory acuity Lungs: Clear throughout to auscultation Heart: Regular rate and rhythm Abdomen: Soft, non-distended.  Large incisional scar noted on abdomen.  Normal bowel sounds.  Epigastric TTP without R/R/G. Musculoskeletal: Symmetrical with no gross deformities  Extremities: No edema  Neurological: Alert oriented x 4, grossly non-focal Psychological:  Alert and cooperative. Normal mood and affect  Assessment and Recommendations: -Epigastric abdominal pain:  Sudden onset on this occasion, but has intermittent recurrent similar pains in the past.  Has pancreatic divisum requiring stent placement in the past and chronic pancreatic enzyme therapy.  ? Pancreatic source of pain.  Also has history of cholecystectomy for stone disease.  Also, celiac and SMA stenosis as seen on recent renal artery duplex.  ? If this could be causing chronic mesenteric ischemia.  Will check a CBC, CMP, and lipase.  Will order CT scan of the abdomen with contrast. -Leg cramps:  Will check CMP as stated above.  She is on diuretics.

## 2014-01-20 NOTE — Progress Notes (Signed)
Agree with excellent evaluation in this complicated patient.  Tricia Mayer, MD, [name]

## 2014-01-21 ENCOUNTER — Ambulatory Visit (INDEPENDENT_AMBULATORY_CARE_PROVIDER_SITE_OTHER)
Admission: RE | Admit: 2014-01-21 | Discharge: 2014-01-21 | Disposition: A | Discharge status: Home / Self Care | Payer: Medicare Other | Source: Ambulatory Visit | Attending: Gastroenterology | Admitting: Gastroenterology

## 2014-01-21 DIAGNOSIS — Z8719 Personal history of other diseases of the digestive system: Secondary | ICD-10-CM

## 2014-01-21 DIAGNOSIS — R1013 Epigastric pain: Secondary | ICD-10-CM | POA: Diagnosis not present

## 2014-01-21 MED ORDER — IOHEXOL 300 MG/ML  SOLN
100.0000 mL | Freq: Once | INTRAMUSCULAR | Status: AC | PRN
  Administered 2014-01-21: 100 mL via INTRAVENOUS

## 2014-01-27 NOTE — Progress Notes (Signed)
Quick Note:  No obvious cause of her pain problem Would let her know that and see how she is doing - she was improving so hope she continued with that trend CT does suggest she could be developing cirrhosis Let's have her follow-up with me next available (assuming she is better) ______

## 2014-01-31 ENCOUNTER — Telehealth: Payer: Self-pay | Admitting: Gastroenterology

## 2014-01-31 NOTE — Telephone Encounter (Signed)
Patient advised that I do not have any earlier appts at this time.  I have placed her on the cancellation list, but have encouraged her to call and check for periodic openings.

## 2014-01-31 NOTE — Telephone Encounter (Signed)
Patient is asking if her appointment with Dr. Carlean Purl can be moved up any.Sheri, do you have any earlier spots?

## 2014-02-08 ENCOUNTER — Telehealth: Payer: Self-pay | Admitting: Cardiology

## 2014-02-08 NOTE — Telephone Encounter (Signed)
RN called pharmacy. Medication was filled yesterday. Patient was notified and she states that pharmacy told her it was not ready/refilled. She will contact pharmacy again.

## 2014-02-08 NOTE — Telephone Encounter (Signed)
Please call refill for her generic lasix to [facility] in [place]---[phone].  Patient has upcoming appointment with Dr. Ellyn Hack but is out of medication and has no refills.

## 2014-02-09 DIAGNOSIS — IMO0001 Reserved for inherently not codable concepts without codable children: Secondary | ICD-10-CM | POA: Diagnosis not present

## 2014-02-09 DIAGNOSIS — R269 Unspecified abnormalities of gait and mobility: Secondary | ICD-10-CM | POA: Diagnosis not present

## 2014-02-09 DIAGNOSIS — M6250 Muscle wasting and atrophy, not elsewhere classified, unspecified site: Secondary | ICD-10-CM | POA: Diagnosis not present

## 2014-02-09 DIAGNOSIS — Z9181 History of falling: Secondary | ICD-10-CM | POA: Diagnosis not present

## 2014-02-23 ENCOUNTER — Ambulatory Visit (INDEPENDENT_AMBULATORY_CARE_PROVIDER_SITE_OTHER): Payer: Medicare Other | Admitting: Cardiology

## 2014-02-23 VITALS — BP 144/60 | HR 84 | Ht 68.0 in | Wt 216.6 lb

## 2014-02-23 DIAGNOSIS — I771 Stricture of artery: Secondary | ICD-10-CM

## 2014-02-23 DIAGNOSIS — R002 Palpitations: Secondary | ICD-10-CM

## 2014-02-23 DIAGNOSIS — I774 Celiac artery compression syndrome: Secondary | ICD-10-CM

## 2014-02-23 DIAGNOSIS — I701 Atherosclerosis of renal artery: Secondary | ICD-10-CM

## 2014-02-23 DIAGNOSIS — R1013 Epigastric pain: Secondary | ICD-10-CM

## 2014-02-23 DIAGNOSIS — E785 Hyperlipidemia, unspecified: Secondary | ICD-10-CM

## 2014-02-23 DIAGNOSIS — I10 Essential (primary) hypertension: Secondary | ICD-10-CM

## 2014-02-23 DIAGNOSIS — I509 Heart failure, unspecified: Secondary | ICD-10-CM

## 2014-02-23 DIAGNOSIS — Z9861 Coronary angioplasty status: Secondary | ICD-10-CM

## 2014-02-23 DIAGNOSIS — I5030 Unspecified diastolic (congestive) heart failure: Secondary | ICD-10-CM

## 2014-02-23 DIAGNOSIS — I739 Peripheral vascular disease, unspecified: Secondary | ICD-10-CM

## 2014-02-23 DIAGNOSIS — R0989 Other specified symptoms and signs involving the circulatory and respiratory systems: Secondary | ICD-10-CM

## 2014-02-23 DIAGNOSIS — I2510 Atherosclerotic heart disease of native coronary artery without angina pectoris: Secondary | ICD-10-CM | POA: Diagnosis not present

## 2014-02-23 MED ORDER — CARVEDILOL 12.5 MG PO TABS: 12.5000 mg | ORAL_TABLET | Freq: Two times a day (BID) | ORAL | Status: DC

## 2014-02-23 NOTE — Patient Instructions (Addendum)
Increase in Coreg (carvediol) 12.5 mg to twice a day---- YOU MAY DOUBLE YOUR 6.25 MG TWICE A DAY UNTIL BOTTLE IS EMPTY  Your physician has requested that you have a carotid duplex. This test is an ultrasound of the carotid arteries in your neck. It looks at blood flow through these arteries that supply the brain with blood. Allow one hour for this exam. There are no restrictions or special instructions.     Your physician wants you to follow-up in 3 month Dr Ellyn Hack.   You will receive a reminder letter in the mail two months in advance. If you don't receive a letter, please call our office to schedule the follow-up appointment.

## 2014-02-27 ENCOUNTER — Encounter: Payer: Self-pay | Admitting: Cardiology

## 2014-02-27 DIAGNOSIS — R0989 Other specified symptoms and signs involving the circulatory and respiratory systems: Secondary | ICD-10-CM | POA: Insufficient documentation

## 2014-02-27 DIAGNOSIS — I6529 Occlusion and stenosis of unspecified carotid artery: Secondary | ICD-10-CM | POA: Insufficient documentation

## 2014-02-27 NOTE — Assessment & Plan Note (Signed)
No real active Angina Sx.  Last cath in 08/2012 shoed great stent patency. Because of vague complaints - we may need to intermittently check f/u Nuclear ST. On AS & Plavix, BB & ARB, but unable to tolerate statin.

## 2014-02-27 NOTE — Assessment & Plan Note (Signed)
Will need f/u Duplex in ~Sept.

## 2014-02-27 NOTE — Assessment & Plan Note (Signed)
Would like to follow this as well with dopplers.  This could explain potential post-prandial epigastric "angina".  She did not seem to correlate any particular symptoms.

## 2014-02-27 NOTE — Assessment & Plan Note (Signed)
Will need to consider referral to [facility] for assistance. She needs a good option for medication. Diet & exercise necessary.

## 2014-02-27 NOTE — Assessment & Plan Note (Signed)
Will need to follow this type of symptom as it may be related to gut ischemia.

## 2014-02-27 NOTE — Assessment & Plan Note (Addendum)
No active SSx of CHF - edema does not coincide with PND or Orthopnea.   Increase Coreg to 12.5 mg bid, continue ARB @ current dose. Continue lasix with QOD metolazone.  Discussed Sliding Scale Lasix (with additional "off day" dose of Metolazone).

## 2014-02-27 NOTE — Assessment & Plan Note (Signed)
With her PAD & CAD history - warrants evaluation.  Bilateral Carotid Dopplers

## 2014-02-27 NOTE — Progress Notes (Signed)
PATIENT: [name] MRN: [id number]  DOB: 11/28/1940   DOV:02/27/2014 PCP: Yong Channel, MD  Clinic Note: Chief Complaint  Patient presents with  . Follow-up    follow-up monitor, little swelling in legs and feet   HPI: [name] is a 73 y.o.  female with a PMH below who presents today for her 2nd return visit to [facility].  She was lost to our f/u after suffering a peri-operative NSTEMI in [facility] (back surgery) -- treated medically.    Several of her medications have been adjusted since her visit with me years ago. She is no longer on carvedilol, and is on low-dose Metoprolol. She remains on aspirin Plavix.  She underwent LHC that revealed patent stents, this was combined with RA Angiography & PTA-Stent of L RAS.  Interval History:  When I saw her back for the 1st time in years last month, her main complaint was palpitations. She wore a [employer] Event Monitor that revealed NSR with occasional PACs - no arrhythmias. I put her back on Carvedilol & stopped Metoprolol.  She reports that the palpitations are notably improved.  She does exercise up to 2 times a week and does not note significant symptoms of chest pain/pressure or dyspnea.   She denies any significant PND, orthopnea, but does note some mild edema. She has relatively significant varicosities suggesting venous stasis edema. Besides the episodes of  palpitations, which have now decreased in frequency, she denies any syncope or near syncope symptoms, TIA/amaurosis fugax symptoms. No melena, hematochezia, hematuria, or epistaxis. No claudication symptoms.  She does note occasionally having some cramping sensations after eating that she is always to be due GERD type symptoms.  Past Medical History  Diagnosis Date  . CAD S/P percutaneous coronary angioplasty     a) LHC: 07/30/10. -- 3.0x36mm Integrity BMS to pRCA & 2.5x 72mm Integrity BMS mLAD(@ D2).  b) Class III-IV Angina 03/2011: LHC- ISR in LAD BMS -- prox  overlapping Promus DES 2.5x26mm and PTCA of jailed D2 ostium-prox 80%. c) 02/03/12:  LHC- patent stents.  Jailed diagonal. with stable flow; d) Peri-OP NSTEMI 04/2012 - LHC in 12/'13 -   . Congestive heart failure with LV diastolic dysfunction, NYHA class 2     06/13/10:  last 2D echo-  EF >55%, Mild TR, Mod Conc LVH - Grade 1 diastolic dysfunction (abnormal relaxation) --> LVEDP on Cath 28 mmHg & mild 2nd Pulm HTN  . Renal artery stenosis 2011; 12/'13    a) Angiogram 02/03/12:  50-60%L RA stenosis, 40% R R Inferior artery; b) 12/'13: S/P L RA Stent ([facility]) 6.0 mm x 15 mm; c) Renal Duplex 10/2013: <60% L RA, <60 R RA, ~60% SMA & Celiac A.  . Labile essential hypertension     Partially related to RAS  . Dyslipidemia, goal LDL below 70     Intolerant to statins  . NSTEMI (non-ST elevated myocardial infarction) 04/2012    Unclear the details, apparently this was postoperative from her back surgery that she was cleared for my last saw her in June.  Reportedly had stents placed  . Bilateral edema of lower extremity     Chronic, likely related to venous stasis  . Irritable bowel syndrome (IBS)   . Diverticulitis of colon (without mention of hemorrhage)    Prior Cardiac Evaluation and Past Surgical History: Procedure Laterality Date  . Coronary angioplasty with stent placement  07/2010, 02/2011    LHC: 07/30/10.  3.0x52mm Integrity BMS to  RCA and 2.5x 75mm Integrity BMS LAD.  03/2011: LHC- ISRS in LAD treated with overlapping Promus DES 2.5x64mm and PTCA of Diagonal.  02/03/12:  LHC- patent stents.  Jailed diagonal.  . Cardiac catheterization  03/2012    ~30% ISR RCA, patent LAD stent & D2; EDP ~28 mmHg  . Cardiovascular stress test      11/07/11:  Normal perfusion pattern.  EF 66% No wall motion abnormalities.   . Renal artery stent Left 08/2012    @ [facility] - 6.0 mm x 15 mm  . Cardiac Catheterization  08/2012    Peri-Op MI @ [facility] -- 40% ostial D1, patent LAD stents, 10% RCA ISR     Allergies  Allergen Reactions  . Codeine Phosphate Swelling  . Lisinopril   . Statins     Intolerant   . Tramadol   . Iodine Swelling and Rash    REACTION: unspecified    Current Outpatient Prescriptions  Medication Sig Dispense Refill  . aspirin 325 MG tablet Take 325 mg by mouth daily.      . clopidogrel (PLAVIX) 75 MG tablet Take 75 mg by mouth daily.      . furosemide (LASIX) 40 MG tablet Take 80mg  in the morning and 40 mg in the evening.  45 tablet  0  . gabapentin (NEURONTIN) 600 MG tablet Take 1,200 mg by mouth 3 (three) times daily.      Marland Kitchen losartan (COZAAR) 100 MG tablet Take 1 tablet by mouth daily.      . metolazone (ZAROXOLYN) 2.5 MG tablet Take one tablet every other day 30 minutes before Furosemide. Call for an appointment for refills.  15 tablet  0  . nitroGLYCERIN (NITROSTAT) 0.4 MG SL tablet Place 0.4 mg under the tongue every 5 (five) minutes as needed for chest pain.      . Pancrelipase, Lip-Prot-Amyl, (ZENPEP) 20000 UNITS CPEP Take 2 capsules (40,000 Units total) by mouth 3 (three) times daily with meals.  180 capsule  6  .  metoprolol (LOPRESSOR) 25 MG tablet Take 1 tablet (25 mg total) by mouth 2 (two) times daily.  60 tablet  11   No current facility-administered medications for this visit.   No change to Social & Family History:  Reviewed in Epic.  ROS: A comprehensive Review of Systems - Pertinent positives were noted above, otherwise Review is negative besides her chronic back and joint pains. She also notes chronic intermittent atypical chest symptoms that are not likley to be angina.  PHYSICAL EXAM BP 144/60  Pulse 84  Ht 5\' 8"  (1.727 m)  Wt 216 lb 9.6 oz (98.249 kg)  BMI 32.94 kg/m2 General appearance: alert, cooperative, appears stated age, no distress and mildly obese HEENT: [place]/AT, EOMI, MMM, anicteric sclera Neck: no adenopathy, soft R carotid bruit, no JVD and supple, symmetrical, trachea midline Lungs: clear to auscultation bilaterally,  normal percussion bilaterally and Nonlabored, good air movement Heart: RRR, normal S1 and S2 with an S4 gallop. 1/6 SEM at RUSB --> carotids. Nondisplaced PMI. No rubs Abdomen: soft, non-tender; bowel sounds normal; no masses,  no organomegaly Extremities: no clubbing or cyanosis but with 1+ bilateral edema with varicosities. Pulses: 2+ and symmetric Neurologic: Mental status: Alert, oriented, thought content appropriate Cranial nerves: normal; normal strength    Adult ECG Report - Not done.  Recent Labs:  01/19/14 - reviewed in [place].  Lipids not checked.    ASSESSMENT / PLAN: Congestive heart failure with LV  diastolic dysfunction, NYHA class 2 No active SSx of CHF - edema does not coincide with PND or Orthopnea.   Increase Coreg to 12.5 mg bid, continue ARB @ current dose. Continue lasix with QOD metolazone.  Discussed Sliding Scale Lasix (with additional "off day" dose of Metolazone).   CAD S/P percutaneous coronary angioplasty: pRCA BMS, mLAD BMS overlapped prox with DES for ISR No real active Angina Sx.  Last cath in 08/2012 shoed great stent patency. Because of vague complaints - we may need to intermittently check f/u Nuclear ST. On AS & Plavix, BB & ARB, but unable to tolerate statin.  Labile essential hypertension BP looks much better with a more potent BB -- increase Carvedilol to 12.5 mg bid for improved control.  Left renal artery stenosis - s/p PTA-Stent 6.0 mm x 15 mm; dopplers show patent LRA. Will need f/u Duplex in ~Sept.    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  This could explain potential post-prandial epigastric "angina".  She did not seem to correlate any particular symptoms.  Palpitation No arrhythmia noted on Event Monitor despite her having a "sensation".  Hopefully with increased BB dose, the suppression of symptoms will improve.  Dyslipidemia, goal LDL below 70 - WITH STATIN INTOLERANCE Will need to  consider referral to [facility] for assistance. She needs a good option for medication. Diet & exercise necessary.  Bruit of right carotid artery With her PAD & CAD history - warrants evaluation.  Bilateral Carotid Dopplers   Abdominal pain, epigastric Will need to follow this type of symptom as it may be related to gut ischemia.    Orders Placed This Encounter  Procedures  . Doppler carotid     Followup:  3 months  DAVID W. Ellyn Hack, M.D., M.S. Interventional Cardiology [facility]

## 2014-02-27 NOTE — Assessment & Plan Note (Signed)
No arrhythmia noted on Event Monitor despite her having a "sensation".  Hopefully with increased BB dose, the suppression of symptoms will improve.

## 2014-02-27 NOTE — Assessment & Plan Note (Signed)
BP looks much better with a more potent BB -- increase Carvedilol to 12.5 mg bid for improved control.

## 2014-03-01 DIAGNOSIS — S99919A Unspecified injury of unspecified ankle, initial encounter: Secondary | ICD-10-CM | POA: Diagnosis not present

## 2014-03-01 DIAGNOSIS — S8990XA Unspecified injury of unspecified lower leg, initial encounter: Secondary | ICD-10-CM | POA: Diagnosis not present

## 2014-03-02 ENCOUNTER — Other Ambulatory Visit: Payer: Self-pay | Admitting: *Deleted

## 2014-03-02 MED ORDER — FUROSEMIDE 40 MG PO TABS: ORAL_TABLET | ORAL | Status: DC

## 2014-03-02 NOTE — Telephone Encounter (Signed)
Rx was sent to pharmacy electronically. 

## 2014-03-08 ENCOUNTER — Other Ambulatory Visit (INDEPENDENT_AMBULATORY_CARE_PROVIDER_SITE_OTHER): Payer: Medicare Other

## 2014-03-08 ENCOUNTER — Telehealth: Payer: Self-pay

## 2014-03-08 ENCOUNTER — Encounter: Payer: Self-pay | Admitting: Internal Medicine

## 2014-03-08 ENCOUNTER — Ambulatory Visit (INDEPENDENT_AMBULATORY_CARE_PROVIDER_SITE_OTHER): Payer: Medicare Other | Admitting: Internal Medicine

## 2014-03-08 VITALS — BP 130/60 | HR 92 | Ht 65.0 in | Wt 220.1 lb

## 2014-03-08 DIAGNOSIS — R1013 Epigastric pain: Secondary | ICD-10-CM

## 2014-03-08 DIAGNOSIS — R932 Abnormal findings on diagnostic imaging of liver and biliary tract: Secondary | ICD-10-CM

## 2014-03-08 DIAGNOSIS — K861 Other chronic pancreatitis: Secondary | ICD-10-CM

## 2014-03-08 DIAGNOSIS — I701 Atherosclerosis of renal artery: Secondary | ICD-10-CM | POA: Diagnosis not present

## 2014-03-08 DIAGNOSIS — R1310 Dysphagia, unspecified: Secondary | ICD-10-CM | POA: Diagnosis not present

## 2014-03-08 DIAGNOSIS — Z7902 Long term (current) use of antithrombotics/antiplatelets: Secondary | ICD-10-CM

## 2014-03-08 MED ORDER — PANCRELIPASE (LIP-PROT-AMYL) 20000-68000 UNITS PO CPEP: 2.0000 | ORAL_CAPSULE | Freq: Three times a day (TID) | ORAL | Status: DC

## 2014-03-08 NOTE — Patient Instructions (Addendum)
You have been scheduled for an endoscopy. Please follow written instructions given to you at your visit today. If you use inhalers (even only as needed), please bring them with you on the day of your procedure. Your physician has requested that you go to www.startemmi.com and enter the access code given to you at your visit today. This web site gives a general overview about your procedure. However, you should still follow specific instructions given to you by our office regarding your preparation for the procedure.  You will be contaced by our office prior to your procedure for directions on holding your Plavix.  If you do not hear from our office 1 week prior to your scheduled procedure, please call [phone] to discuss.  We want you to continue your Asprin while you hold your plavix.   You have been scheduled for an MRCP at [facility].. Your appointment time is 03/22/14 at 1:00pm. Please arrive 15 minutes prior to your appointment time for registration purposes. Please make certain not to have anything to eat or drink 6 hours prior to your test. In addition, if you have any metal in your body, have a pacemaker or defibrillator, please be sure to let your ordering physician know. This test typically takes 45 minutes to 1 hour to complete.   Your physician has requested that you go to the basement for the following lab work before leaving today: BMET  We have sent the following medications to your pharmacy for you to pick up at your convenience: Zenpep  I appreciate the opportunity to care for you.

## 2014-03-08 NOTE — Telephone Encounter (Signed)
[facility] [address] [place] [address]  03/08/2014   RE: [name] DOB: 16-Dec-1940 MRN: [id number]   Dear Glenetta Hew M.D.,    We have scheduled the above patient for an endoscopic procedure. Our records show that she is on anticoagulation therapy.   Please advise as to how long the patient may come off her therapy of Plavix prior to the EGD with dilation procedure, which is scheduled for 03/29/14.  Please fax back/ or route the completed form to [facility] [place], [place] at [phone].   Sincerely,    Luan Moore.D.

## 2014-03-08 NOTE — Progress Notes (Addendum)
Subjective:    Patient ID: [name], female    DOB: 07-27-1941, 73 y.o.   MRN: [id number]  HPI The patient is an elderly woman obesity followed by my retired partner who has been having a lot of epigastric pain. The pain is in the upper abdomen epigastrium and radiates into the back. It may also be in the left and right upper quadrants. It mostly post eating.  She also has a history of constant diarrhea for 20 years, and a diagnosis of either chronic pancreatitis or pancreatic insufficiency. She is out of her pancreatic enzymes and needs a refill.  As part of her workup before when she saw one of our physician assistant, CT scanning showed hypertrophy of the left lobe liver which is suggestive of cirrhosis. She has been informed of this has been very concerned about this. She does have a long history of known fatty liver.  Daughter, husband here and participate in the interview.  Wt Readings from Last 3 Encounters:  03/08/14 220 lb 2 oz (99.848 kg)  02/23/14 216 lb 9.6 oz (98.249 kg)  01/19/14 213 lb 3.2 oz (96.707 kg)  Medications, allergies, past medical history, past surgical history, family history and social history are reviewed and updated in the EMR.   Review of Systems Edema of the ankles as noted, she also has dyspnea on exertion and weakness. He had hypokalemia with last electrolytes    Objective:   Physical Exam General:  NAD, obese Eyes:   anicteric Lungs:  clear Heart:  S1S2 no rubs, murmurs or gallops Abdomen:  soft and nontender, BS+, no HSM/mass, no bruits Ext:   Trace non-pitting edema    Data Reviewed:  CT scan report, labs, prior GI notes     Assessment & Plan:  Abdominal pain, epigastric Cause of this is not clear. When she was in the office I did not realize she was not on a PPI and we will call her and start one. Upper GI endoscopy as planned. Need to consider the possibility of mesenteric ischemia leading to some of this though she is not losing  weight like she should if that was the problem. 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 Pancreas may be part of the problem as well. MRCP has been ordered for that and to evaluate the liver.  Abnormal CT of liver-possible cirrhosis MRI MRCP has been ordered. Fatty liver and [name] are quite possible.  Idiopathic chronic pancreatitis suspected MRCP will help sort this out. Refill pancreatic enzymes. It may be more just a pancreatic insufficiency issue.  Dysphagia Evaluated with EGD.The risks and benefits as well as alternatives of endoscopic procedure(s) have been discussed and reviewed. All questions answered. The patient agrees to proceed. Possible esophageal dilation. Start PPI.   Hold clopidogrel prior to endoscopy and possible esophageal dilation. We will check with Dr. Ellyn Hack on this. Meds ordered this encounter  Medications  . Pancrelipase, Lip-Prot-Amyl, (ZENPEP) 20000 UNITS CPEP    Sig: Take 2 capsules (40,000 Units total) by mouth 3 (three) times daily with meals.    Dispense:  270 capsule    Refill:  3  . pantoprazole (PROTONIX) 40 MG tablet    Sig: Take 1 tablet (40 mg total) by mouth daily before breakfast.    Dispense:  30 tablet    Refill:  2  CC: Yong Channel, MD We'll also copy Dr. Glenetta Hew

## 2014-03-09 ENCOUNTER — Ambulatory Visit (HOSPITAL_COMMUNITY)
Admission: RE | Admit: 2014-03-09 | Discharge: 2014-03-09 | Disposition: A | Discharge status: Home / Self Care | Payer: Medicare Other | Source: Ambulatory Visit | Attending: Cardiology | Admitting: Cardiology

## 2014-03-09 ENCOUNTER — Encounter: Payer: Self-pay | Admitting: Internal Medicine

## 2014-03-09 DIAGNOSIS — I739 Peripheral vascular disease, unspecified: Secondary | ICD-10-CM | POA: Insufficient documentation

## 2014-03-09 DIAGNOSIS — R0989 Other specified symptoms and signs involving the circulatory and respiratory systems: Secondary | ICD-10-CM

## 2014-03-09 LAB — BASIC METABOLIC PANEL
BUN: 17 mg/dL (ref 6–23)
CHLORIDE: 97 meq/L (ref 96–112)
CO2: 33 meq/L — AB (ref 19–32)
Calcium: 9.2 mg/dL (ref 8.4–10.5)
Creatinine, Ser: 0.8 mg/dL (ref 0.4–1.2)
GFR: 72.85 mL/min (ref >60.00)
GLUCOSE: 109 mg/dL — AB (ref 70–99)
Potassium: 3.6 mEq/L (ref 3.5–5.1)
SODIUM: 138 meq/L (ref 135–145)

## 2014-03-09 MED ORDER — PANCRELIPASE (LIP-PROT-AMYL) 20000-68000 UNITS PO CPEP: ORAL_CAPSULE | ORAL | Status: DC

## 2014-03-09 NOTE — Telephone Encounter (Signed)
She is OK to stop Plavix 5 days pre-procedure.  Restart Day #1 post-op.  Leonie Man, MD

## 2014-03-09 NOTE — Telephone Encounter (Signed)
I called patient back and advised her that per Dr. Ellyn Hack it is okay to hold her Plavix for five days before the procedure. I advised patient after procedure they will discuss with her restarting Plavix.  Patient stated Zenpep is expensive. I advised patient we have samples here of Zenpep we can give her because we have to give all the samples out we can because we will soon stop giving samples except on certain medications and preps.  Patient verbalized understanding and will be here tomorrow to pick up samples.

## 2014-03-09 NOTE — Progress Notes (Signed)
Carotid Duplex Completed. Rual Vermeer, BS, RDMS, RVT  

## 2014-03-10 DIAGNOSIS — K861 Other chronic pancreatitis: Secondary | ICD-10-CM | POA: Insufficient documentation

## 2014-03-10 DIAGNOSIS — R932 Abnormal findings on diagnostic imaging of liver and biliary tract: Secondary | ICD-10-CM | POA: Insufficient documentation

## 2014-03-10 DIAGNOSIS — R1310 Dysphagia, unspecified: Secondary | ICD-10-CM | POA: Insufficient documentation

## 2014-03-10 DIAGNOSIS — Z7902 Long term (current) use of antithrombotics/antiplatelets: Secondary | ICD-10-CM | POA: Insufficient documentation

## 2014-03-10 MED ORDER — PANTOPRAZOLE SODIUM 40 MG PO TBEC: 40.0000 mg | DELAYED_RELEASE_TABLET | Freq: Every day | ORAL | Status: DC

## 2014-03-10 NOTE — Assessment & Plan Note (Signed)
Evaluated with EGD.The risks and benefits as well as alternatives of endoscopic procedure(s) have been discussed and reviewed. All questions answered. The patient agrees to proceed. Possible esophageal dilation. Start PPI.

## 2014-03-10 NOTE — Progress Notes (Signed)
Quick Note:  Cardiac Event Monitor Final Report:  Mostly Sinus Rhythm with intermittent PVCs.  HR average ~70-80. Some Sinus Tachycardia in ~100-110 bpm range. 1 5 beat run of NSVT (6/2 @ 2216 hrs) - AutoTrigger with no recorded symptoms.  Recommendation: continue Beta Blocker (Carvedilol)  HARDING,DAVID W  ______

## 2014-03-10 NOTE — Assessment & Plan Note (Signed)
Cause of this is not clear. When she was in the office I did not realize she was not on a PPI and we will call her and start one. Upper GI endoscopy as planned. Need to consider the possibility of mesenteric ischemia leading to some of this though she is not losing weight like she should if that was the problem. 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 Pancreas may be part of the problem as well. MRCP has been ordered for that and to evaluate the liver.

## 2014-03-10 NOTE — Assessment & Plan Note (Signed)
MRCP will help sort this out. Refill pancreatic enzymes. It may be more just a pancreatic insufficiency issue.

## 2014-03-10 NOTE — Assessment & Plan Note (Addendum)
MRI MRCP has been ordered. Fatty liver and [name] are quite possible.

## 2014-03-14 DIAGNOSIS — M461 Sacroiliitis, not elsewhere classified: Secondary | ICD-10-CM | POA: Diagnosis not present

## 2014-03-16 DIAGNOSIS — H04129 Dry eye syndrome of unspecified lacrimal gland: Secondary | ICD-10-CM | POA: Diagnosis not present

## 2014-03-17 ENCOUNTER — Other Ambulatory Visit: Payer: Self-pay | Admitting: Internal Medicine

## 2014-03-17 DIAGNOSIS — R932 Abnormal findings on diagnostic imaging of liver and biliary tract: Secondary | ICD-10-CM

## 2014-03-17 DIAGNOSIS — K861 Other chronic pancreatitis: Secondary | ICD-10-CM

## 2014-03-22 ENCOUNTER — Ambulatory Visit (HOSPITAL_COMMUNITY)
Admission: RE | Admit: 2014-03-22 | Discharge: 2014-03-22 | Disposition: A | Discharge status: Home / Self Care | Payer: Medicare Other | Source: Ambulatory Visit | Attending: Internal Medicine | Admitting: Internal Medicine

## 2014-03-22 ENCOUNTER — Other Ambulatory Visit: Payer: Self-pay | Admitting: Internal Medicine

## 2014-03-22 DIAGNOSIS — N289 Disorder of kidney and ureter, unspecified: Secondary | ICD-10-CM | POA: Insufficient documentation

## 2014-03-22 DIAGNOSIS — K861 Other chronic pancreatitis: Secondary | ICD-10-CM | POA: Diagnosis not present

## 2014-03-22 DIAGNOSIS — K838 Other specified diseases of biliary tract: Secondary | ICD-10-CM | POA: Insufficient documentation

## 2014-03-22 DIAGNOSIS — D3500 Benign neoplasm of unspecified adrenal gland: Secondary | ICD-10-CM | POA: Insufficient documentation

## 2014-03-22 DIAGNOSIS — R197 Diarrhea, unspecified: Secondary | ICD-10-CM | POA: Insufficient documentation

## 2014-03-22 DIAGNOSIS — R932 Abnormal findings on diagnostic imaging of liver and biliary tract: Secondary | ICD-10-CM

## 2014-03-22 DIAGNOSIS — M461 Sacroiliitis, not elsewhere classified: Secondary | ICD-10-CM | POA: Diagnosis not present

## 2014-03-22 DIAGNOSIS — R109 Unspecified abdominal pain: Secondary | ICD-10-CM | POA: Diagnosis not present

## 2014-03-22 DIAGNOSIS — IMO0001 Reserved for inherently not codable concepts without codable children: Secondary | ICD-10-CM | POA: Diagnosis not present

## 2014-03-22 DIAGNOSIS — R269 Unspecified abnormalities of gait and mobility: Secondary | ICD-10-CM | POA: Diagnosis not present

## 2014-03-22 DIAGNOSIS — M6250 Muscle wasting and atrophy, not elsewhere classified, unspecified site: Secondary | ICD-10-CM | POA: Diagnosis not present

## 2014-03-22 MED ORDER — GADOBENATE DIMEGLUMINE 529 MG/ML IV SOLN
20.0000 mL | Freq: Once | INTRAVENOUS | Status: AC | PRN
  Administered 2014-03-22: 20 mL via INTRAVENOUS

## 2014-03-23 NOTE — Progress Notes (Signed)
Quick Note:  Liver ok - does not look like cirrhosis Bile duct slightly dilated but can be that way after gallbladder removal Will explain more at EGD next week  My Chart notice  ______

## 2014-03-24 ENCOUNTER — Telehealth: Payer: Self-pay | Admitting: *Deleted

## 2014-03-24 NOTE — Telephone Encounter (Signed)
Pt. Came by the office and filled out yellow sheet requesting results of most recent carotid doppler; pt. Called at number given there was no answer and results left on pts voicemail

## 2014-03-29 ENCOUNTER — Other Ambulatory Visit: Payer: Self-pay

## 2014-03-29 ENCOUNTER — Encounter: Payer: Self-pay | Admitting: Internal Medicine

## 2014-03-29 ENCOUNTER — Ambulatory Visit (AMBULATORY_SURGERY_CENTER): Payer: Medicare Other | Admitting: Internal Medicine

## 2014-03-29 VITALS — BP 160/72 | HR 78 | Temp 98.5°F | Resp 21 | Ht 65.0 in | Wt 220.0 lb

## 2014-03-29 DIAGNOSIS — R1013 Epigastric pain: Secondary | ICD-10-CM | POA: Diagnosis not present

## 2014-03-29 DIAGNOSIS — R1310 Dysphagia, unspecified: Secondary | ICD-10-CM | POA: Diagnosis not present

## 2014-03-29 DIAGNOSIS — I10 Essential (primary) hypertension: Secondary | ICD-10-CM | POA: Diagnosis not present

## 2014-03-29 DIAGNOSIS — I509 Heart failure, unspecified: Secondary | ICD-10-CM | POA: Diagnosis not present

## 2014-03-29 DIAGNOSIS — K319 Disease of stomach and duodenum, unspecified: Secondary | ICD-10-CM

## 2014-03-29 DIAGNOSIS — I2510 Atherosclerotic heart disease of native coronary artery without angina pectoris: Secondary | ICD-10-CM | POA: Diagnosis not present

## 2014-03-29 DIAGNOSIS — R109 Unspecified abdominal pain: Secondary | ICD-10-CM | POA: Diagnosis not present

## 2014-03-29 DIAGNOSIS — I252 Old myocardial infarction: Secondary | ICD-10-CM | POA: Diagnosis not present

## 2014-03-29 MED ORDER — SODIUM CHLORIDE 0.9 % IV SOLN: 500.0000 mL | INTRAVENOUS | Status: DC

## 2014-03-29 NOTE — Op Note (Addendum)
[place]  [address] [place], [id number]   ENDOSCOPY PROCEDURE REPORT  PATIENT: Tricia, Herring  MR#: [id number] BIRTHDATE: 1940-09-29 , 72  yrs. old GENDER: Female ENDOSCOPIST: Gatha Mayer, MD, [facility] PROCEDURE DATE:  03/29/2014 PROCEDURE:  EGD w/ biopsy and Maloney dilation of esophagus ASA CLASS:     Class III INDICATIONS:  Epigastric pain.   Dysphagia. MEDICATIONS: Propofol (Diprivan) 140 mg IV, MAC sedation, administered by CRNA, and These medications were titrated to patient response per physician's verbal order TOPICAL ANESTHETIC: none  DESCRIPTION OF PROCEDURE: After the risks benefits and alternatives of the procedure were thoroughly explained, informed consent was obtained.  The LB [id number] endoscope was introduced through the mouth and advanced to the second portion of the duodenum. Without limitations.  The instrument was slowly withdrawn as the mucosa was fully examined.        STOMACH: Mild non-erosive gastritis (inflammation) was found in the gastric antrum.  Multiple biopsies were performed using cold forceps.  Sample sent for histology.  The remainder of the upper endoscopy exam was otherwise normal. Retroflexed views revealed no abnormalities.     The scope was then withdrawn from the patient, a 58 Fr Maloney dilator was easily passed without heme,  and the procedure completed.  COMPLICATIONS: There were no complications. ENDOSCOPIC IMPRESSION: 1.   Non-erosive gastritis (inflammation) was found in the gastric antrum; multiple biopsies 2.   The remainder of the upper endoscopy exam was otherwise normal - 47 Fr Maloney dilation performed to treat dysphagia  RECOMMENDATIONS: Clear liquids until 1 PM , then soft foods rest of day.  Resume prior diet tomorrow. My office will schedule a CT angio of abdomen and pelvis to evaluate epigastric pain  with atherosclerosis restart clopidogrel   eSigned:  Gatha Mayer, MD,  [facility] 03/29/2014 12:11 PM Revised: 03/29/2014 12:11 PM  CC:The Patient and Kristopher Glee, MD

## 2014-03-29 NOTE — Progress Notes (Signed)
Report to PACU, RN, vss, BBS= Clear.  

## 2014-03-29 NOTE — Progress Notes (Signed)
Called to room to assist during endoscopic procedure.  Patient ID and intended procedure confirmed with present staff. Received instructions for my participation in the procedure from the performing physician.  

## 2014-03-29 NOTE — Patient Instructions (Addendum)
I found possible gastritis in the stomach - inflammation. I took biopsies to understand it better. I will let you know.  Everything else looked ok though I went ahead and stretched the esophagus to help you swallow better.  I am going to order a test to check the blood flow to your intestines - called a CT angiogram.  I will let you know results and plans.  I appreciate the opportunity to care for you. Gatha Mayer, MD, FACG  YOU HAD AN ENDOSCOPIC PROCEDURE TODAY AT [facility]: Refer to the procedure report that was given to you for any specific questions about what was found during the examination.  If the procedure report does not answer your questions, please call your gastroenterologist to clarify.  If you requested that your care partner not be given the details of your procedure findings, then the procedure report has been included in a sealed envelope for you to review at your convenience later.  YOU SHOULD EXPECT: Some feelings of bloating in the abdomen. Passage of more gas than usual.  Walking can help get rid of the air that was put into your GI tract during the procedure and reduce the bloating. If you had a lower endoscopy (such as a colonoscopy or flexible sigmoidoscopy) you may notice spotting of blood in your stool or on the toilet paper. If you underwent a bowel prep for your procedure, then you may not have a normal bowel movement for a few days.  DIET: Your first meal following the procedure should be a light meal and then it is ok to progress to your normal diet.  A half-sandwich or bowl of soup is an example of a good first meal.  Heavy or fried foods are harder to digest and may make you feel nauseous or bloated.  Likewise meals heavy in dairy and vegetables can cause extra gas to form and this can also increase the bloating.  Drink plenty of fluids but you should avoid alcoholic beverages for 24 hours.  ACTIVITY: Your care partner should take you home  directly after the procedure.  You should plan to take it easy, moving slowly for the rest of the day.  You can resume normal activity the day after the procedure however you should NOT DRIVE or use heavy machinery for 24 hours (because of the sedation medicines used during the test).    SYMPTOMS TO REPORT IMMEDIATELY: A gastroenterologist can be reached at any hour.  During normal business hours, 8:30 AM to 5:00 PM Monday through Friday, call [phone].  After hours and on weekends, please call the GI answering service at [phone] who will take a message and have the physician on call contact you.   Following upper endoscopy (EGD)  Vomiting of blood or coffee ground material  New chest pain or pain under the shoulder blades  Painful or persistently difficult swallowing  New shortness of breath  Fever of 100F or higher  Black, tarry-looking stools  FOLLOW UP: If any biopsies were taken you will be contacted by phone or by letter within the next 1-3 weeks.  Call your gastroenterologist if you have not heard about the biopsies in 3 weeks.  Our staff will call the home number listed on your records the next business day following your procedure to check on you and address any questions or concerns that you may have at that time regarding the information given to you following your procedure. This is a Manufacturing engineer  call and so if there is no answer at the home number and we have not heard from you through the emergency physician on call, we will assume that you have returned to your regular daily activities without incident.  SIGNATURES/CONFIDENTIALITY: You and/or your care partner have signed paperwork which will be entered into your electronic medical record.  These signatures attest to the fact that that the information above on your After Visit Summary has been reviewed and is understood.  Full responsibility of the confidentiality of this discharge information lies with you and/or your  care-partner.   Recommendations Clear liquids until 1:00pm, then soft foods rest of day. Resume prior diet tomorrow.  Office will schedule a CT angiogram of abdomen and pelvis to valuate epigastric pain. Restart Clopidogrel.

## 2014-03-30 ENCOUNTER — Telehealth: Payer: Self-pay

## 2014-03-30 NOTE — Progress Notes (Signed)
Patient is scheduled for CT angio for 04/05/14 2:00.  She verbalized understanding to be NPO for 6 hours prior and to arrive at 1:45

## 2014-03-30 NOTE — Telephone Encounter (Signed)
  Follow up Call-  Call back number 03/29/2014 04/08/2012  Post procedure Call Back phone  # [phone]  Permission to leave phone message Yes Yes     Patient questions:  Do you have a fever, pain , or abdominal swelling? No. Pain Score  0 *  Have you tolerated food without any problems? Yes.    Have you been able to return to your normal activities? Yes.    Do you have any questions about your discharge instructions: Diet   No. Medications  No. Follow up visit  No.  Do you have questions or concerns about your Care? No.  Actions: * If pain score is 4 or above: No action needed, pain <4.

## 2014-04-05 ENCOUNTER — Ambulatory Visit (INDEPENDENT_AMBULATORY_CARE_PROVIDER_SITE_OTHER)
Admission: RE | Admit: 2014-04-05 | Discharge: 2014-04-05 | Disposition: A | Discharge status: Home / Self Care | Payer: Medicare Other | Source: Ambulatory Visit | Attending: Internal Medicine | Admitting: Internal Medicine

## 2014-04-05 DIAGNOSIS — I774 Celiac artery compression syndrome: Secondary | ICD-10-CM | POA: Diagnosis not present

## 2014-04-05 DIAGNOSIS — R1013 Epigastric pain: Secondary | ICD-10-CM | POA: Diagnosis not present

## 2014-04-05 MED ORDER — IOHEXOL 350 MG/ML SOLN
100.0000 mL | Freq: Once | INTRAVENOUS | Status: AC | PRN
  Administered 2014-04-05: 100 mL via INTRAVENOUS

## 2014-04-06 NOTE — Progress Notes (Signed)
Quick Note:  CT shows narrowing of the arteries to the stomach and intestines - I am going to discuss with Dr. Ellyn Hack re: treatment ______

## 2014-04-07 NOTE — Progress Notes (Signed)
Quick Note:  Stomach biopsies are ok not a problem  Still waiting to hear from Dr. Allison Quarry partner about further evaluation and treatment of narrowed arteries to GI tract  [facility] - no letter/recall ______

## 2014-04-14 NOTE — Progress Notes (Signed)
Quick Note:  No news from Dr. Gwenlyn Found at this point'refer to vascular surgery re: celiac and mesenteric artery stenoses and abdominal pain ______

## 2014-04-18 ENCOUNTER — Telehealth: Payer: Self-pay

## 2014-04-18 NOTE — Telephone Encounter (Signed)
Observe for more problems - if this persists - diarrhea and bleeding then we will need to schedule a colonoscopy  If it is severe and does not let up head to emergency dept

## 2014-04-18 NOTE — Telephone Encounter (Signed)
Spoke with patient and informed her of your advise.  She verbalized understanding.  She has appointment with Dr. Trula Slade set for 04/20/14 and then with Dr. Gwenlyn Found 04/25/14.

## 2014-04-18 NOTE — Telephone Encounter (Signed)
Called patient to let her know she will be hearing from [facility] to set up appointment.  We faxed the consult papers over today.  She reports that she had awful pains under both breast/upper abdomin area Friday and Sunday, better today.  The pain was knife like, no nausea or vomiting , had diarrhea Friday and Saturday, no fever.  She has seen BRB in her stools.  Please advise Sir, thank you.

## 2014-04-19 ENCOUNTER — Encounter: Payer: Self-pay | Admitting: Surgery

## 2014-04-20 ENCOUNTER — Ambulatory Visit (INDEPENDENT_AMBULATORY_CARE_PROVIDER_SITE_OTHER): Payer: Medicare Other | Admitting: Surgery

## 2014-04-20 ENCOUNTER — Encounter: Payer: Self-pay | Admitting: Surgery

## 2014-04-20 VITALS — BP 154/67 | HR 96 | Resp 16 | Ht 68.0 in | Wt 215.0 lb

## 2014-04-20 DIAGNOSIS — I701 Atherosclerosis of renal artery: Secondary | ICD-10-CM | POA: Diagnosis not present

## 2014-04-20 DIAGNOSIS — I774 Celiac artery compression syndrome: Secondary | ICD-10-CM | POA: Diagnosis not present

## 2014-04-20 DIAGNOSIS — I771 Stricture of artery: Secondary | ICD-10-CM

## 2014-04-20 DIAGNOSIS — K551 Chronic vascular disorders of intestine: Secondary | ICD-10-CM | POA: Diagnosis not present

## 2014-04-20 NOTE — Progress Notes (Signed)
2   Patient name: [name] MRN: [id number] DOB: Sep 27, 1940 Sex: female   Referred by: Dr Carlean Purl  Reason for referral:  Chief Complaint  Patient presents with  . New Evaluation    celiac and mesenteric artery stenosis and abdominal pain    HISTORY OF PRESENT ILLNESS: This is a very pleasant 73 year old female who is referred for possible mesenteric ischemia.  The patient reports a long-standing history of abdominal pain.  She underwent cholecystectomy at age 66.  She has a history of being diagnosed with pancreatic divisum.  She reports having had a stent placed at [facility] some 20 years ago.  She states that she began having worsening abdominal pain in May.  She states that anything causes her to have trouble rolling over in bed at night.  It becomes very tender to touch.  This is in her epigastric area and right upper quadrant.  She is not really sure if this is related to eating or not.  She denies a history of weight loss.  She recently underwent MRCP which showed biliary ductal dilation.  There was a small septation in the common bile duct without filling defect she had pancreas divisum without dilation of the dorsal pancreatic duct.  She also underwent CT scan which showed a high-grade celiac stenosis with a mild stenosis of the superior mesenteric and inferior mesenteric artery.  The patient has a history of coronary artery disease.  She has had 3 coronary stents placed.  She has had a MI.  She also suffers from congestive heart failure.  She is medically managed for hypertension.  She has been diagnosed with renal artery stenosis and has undergone renal stenting.  She is on an angiotensin receptor blocker.  She has hypercholesterolemia but does not want to take a statin.  She is on double antiplatelet therapy with aspirin and Plavix.    Past Medical History  Diagnosis Date  . CAD S/P percutaneous coronary angioplasty     a) LHC: 07/30/10. -- 3.0x7mm Integrity BMS to pRCA & 2.5x  71mm Integrity BMS mLAD(@ D2).  b) Class III-IV Angina 03/2011: LHC- ISR in LAD BMS -- prox overlapping Promus DES 2.5x65mm and PTCA of jailed D2 ostium-prox 80%. c) 02/03/12:  LHC- patent stents.  Jailed diagonal. with stable flow; d) Peri-OP NSTEMI 04/2012 - LHC in 12/'13 -   . Congestive heart failure with LV diastolic dysfunction, NYHA class 2     06/13/10:  last 2D echo-  EF >55%, Mild TR, Mod Conc LVH - Grade 1 diastolic dysfunction (abnormal relaxation) --> LVEDP on Cath 28 mmHg & mild 2nd Pulm HTN  . Renal artery stenosis 2011; 12/'13    a) Angiogram 02/03/12:  50-60%L RA stenosis, 40% R R Inferior artery; b) 12/'13: S/P L RA Stent ([facility]) 6.0 mm x 15 mm; c) Renal Duplex 10/2013: <60% L RA, <60 R RA, ~60% SMA & Celiac A.  . Labile essential hypertension     Partially related to RAS  . Dyslipidemia, goal LDL below 70     Intolerant to statins  . NSTEMI (non-ST elevated myocardial infarction) 04/2012    Unclear the details, apparently this was postoperative from her back surgery that she was cleared for my last saw her in June.  Reportedly had stents placed  . Bilateral edema of lower extremity     Chronic, likely related to venous stasis  . Irritable bowel syndrome (IBS)   . Diverticulitis of colon (without mention of hemorrhage)   .  Hepatomegaly   . Pancreas divisum     On pancreatic enzyme  . Calculus of kidney   . Unspecified disorder of thyroid   . Back pain with radiation     Chronic Back Pain - mutliple surgeries (including tumor removal)  . GERD (gastroesophageal reflux disease)   . Hiatal hernia   . Unspecified gastritis and gastroduodenitis without mention of hemorrhage   . Stricture and stenosis of esophagus   . Arthritis   . Blood transfusion without reported diagnosis   . Ulcer     Past Surgical History  Procedure Laterality Date  . Cholecystectomy    . Minor papilla stent      Dr Pershing Proud  . Knee surgery      left  . Foot surgery      right and left  .  Hip surgery      left  . Breast ductal system excision      right  . Abdominal hysterectomy    . Hand surgery      left  . Spine surgery      tumor removed 07/2010; Redo Surgery 04/2012; Sacroiliac Sgx 08/2013  . Coronary angioplasty with stent placement  07/2010, 02/2011    LHC: 07/30/10.  3.0x61mm Integrity BMS to RCA and 2.5x 31mm Integrity BMS LAD.  03/2011: LHC- ISRS in LAD treated with overlapping Promus DES 2.5x15mm and PTCA of Diagonal.  02/03/12:  LHC- patent stents.  Jailed diagonal.  . Cardiac catheterization  03/2012    ~30% ISR RCA, patent LAD stent & D2; EDP ~28 mmHg  . Cardiovascular stress test      11/07/11:  Normal perfusion pattern.  EF 66% No wall motion abnormalities.   . Renal artery stent Left 08/2012    @ [facility] - 6.0 mm x 15 mm  . Cardiac catheterization  08/2012    Peri-Op MI @ [facility] -- 40% ostial D1, patent LAD stents, 10% RCA ISR  . Colonoscopy  03/01/2009    normal   . Esophagogastroduodenoscopy  04/08/2012  . Cataract extraction      History   Social History  . Marital Status: Married    Spouse Name: N/A    Number of Children: 1  . Years of Education: N/A   Occupational History  . gift shop owner    Social History Main Topics  . Smoking status: Never Smoker   . Smokeless tobacco: Never Used  . Alcohol Use: No  . Drug Use: No  . Sexual Activity: Not on file   Other Topics Concern  . Not on file   Social History Narrative   Married mother of one, one grandchild.   Very socially active, enjoys cooking and having get-togethers her house. She had been exercising regularly but her back pain limits her.   Does not smoke, does not drink alcohol.    Family History  Problem Relation Age of Onset  . Cancer Father     mets  . Heart attack Brother   . Heart attack Brother   . Heart attack Father   . Stroke Mother   . Colitis Maternal Grandfather   . Diabetes Brother   . Colon cancer Neg Hx     Allergies as of 04/20/2014  - Review Complete 04/20/2014  Allergen Reaction Noted  . Codeine phosphate Swelling 08/18/2006  . Lisinopril  11/02/2012  . Statins  11/02/2012  . Tramadol  11/02/2012  . Iodine Swelling and Rash 08/18/2006  Current Outpatient Prescriptions on File Prior to Visit  Medication Sig Dispense Refill  . aspirin 325 MG tablet Take 325 mg by mouth daily.      . carvedilol (COREG) 12.5 MG tablet Take 1 tablet (12.5 mg total) by mouth 2 (two) times daily.  60 tablet  11  . clopidogrel (PLAVIX) 75 MG tablet Take 75 mg by mouth daily.      . furosemide (LASIX) 40 MG tablet Take 80mg  in the morning and 40 mg in the evening.  90 tablet  10  . gabapentin (NEURONTIN) 600 MG tablet Take 1,200 mg by mouth 3 (three) times daily.      Marland Kitchen lidocaine (XYLOCAINE) 5 % ointment Apply 1 application topically daily.      Marland Kitchen losartan (COZAAR) 100 MG tablet Take 1 tablet by mouth daily.      . metolazone (ZAROXOLYN) 2.5 MG tablet Take one tablet every other day 30 minutes before Furosemide. Call for an appointment for refills.  15 tablet  0  . nitroGLYCERIN (NITROSTAT) 0.4 MG SL tablet Place 0.4 mg under the tongue every 5 (five) minutes as needed for chest pain.      . Pancrelipase, Lip-Prot-Amyl, (ZENPEP) 20000 UNITS CPEP Take 2 capsules (40,000 Units total) by mouth 3 (three) times daily with meals.  270 capsule  3  . pantoprazole (PROTONIX) 40 MG tablet Take 1 tablet (40 mg total) by mouth daily before breakfast.  30 tablet  2   No current facility-administered medications on file prior to visit.     REVIEW OF SYSTEMS: Cardiovascular: No chest pain, chest pressure, orthopnea, or dyspnea on exertion. No claudication or rest pain,  No history of DVT or phlebitis.  Positive for palpitations Pulmonary: No productive cough, asthma or wheezing. Neurologic: No weakness, paresthesias, aphasia, or amaurosis. No dizziness. Hematologic: No bleeding problems or clotting disorders. Musculoskeletal: No joint pain or joint  swelling. Gastrointestinal: No blood in stool or hematemesis Genitourinary: No dysuria or hematuria. Psychiatric:: No history of major depression. Integumentary: No rashes or ulcers. Constitutional: No fever or chills.  PHYSICAL EXAMINATION: General: The patient appears their stated age.  Vital signs are BP 154/67  Pulse 96  Resp 16  Ht 5\' 8"  (1.727 m)  Wt 215 lb (97.523 kg)  BMI 32.70 kg/m2 HEENT:  No gross abnormalities Pulmonary: Respirations are non-labored Abdomen: Soft .  Epigastric tenderness.  Well-healed midline incision without hernia Musculoskeletal: There are no major deformities.   Neurologic: No focal weakness or paresthesias are detected, Skin: There are no ulcer or rashes noted. Psychiatric: The patient has normal affect. Cardiovascular: There is a regular rate and rhythm without significant murmur appreciated.  2+ pitting edema bilaterally.  I could not palpate pedal pulses..  Diagnostic Studies: I have reviewed her CT angiogram which shows a celiac origin high-grade stenosis.  The SMA and IMA are patent without significant stenosis.  There is mild narrowing at the superior mesenteric artery origin    Assessment:  Acute on chronic abdominal pain Plan: I had a very extensive discussion with the patient and her husband.  With her history of pancreaticobiliary problems in the past, and the appearance of her visceral vessels on CT angiography, I think it is unlikely that her abdominal pain is related to chronic mesenteric ischemia.  However, in my experience I have had people with an isolated celiac stenosis that did benefit from revascularization.  Before proceeding with this, I would like to exhaust all potential causes of her abdominal pain that  related to her GI tract and biliary tract.  I am referring her back to GI to reevaluate her and help make the decision, as to whether or not to proceed with celiac artery stenting.  If we proceed with stenting, I did discuss the  details of the procedure.  I told her not to stop her aspirin and Plavix.  I discussed the potential complications of the procedure, as well as the potential for recurrence of disease.  The patient is going to see Dr. Carlean Purl, and then contact me if we need to proceed with the celiac artery stenting.  I think this can be accomplished coming from the groin.  I did state that potentially she would need arm access, but I would initially began in the groin.     Eldridge Abrahams, M.D. [facility] Office: [phone] Pager:  [phone]

## 2014-04-25 ENCOUNTER — Ambulatory Visit (INDEPENDENT_AMBULATORY_CARE_PROVIDER_SITE_OTHER): Payer: Medicare Other | Admitting: Cardiovascular Disease

## 2014-04-25 ENCOUNTER — Encounter: Payer: Self-pay | Admitting: Cardiovascular Disease

## 2014-04-25 VITALS — BP 130/60 | HR 88 | Ht 68.0 in | Wt 218.0 lb

## 2014-04-25 DIAGNOSIS — R1013 Epigastric pain: Secondary | ICD-10-CM

## 2014-04-25 DIAGNOSIS — I701 Atherosclerosis of renal artery: Secondary | ICD-10-CM | POA: Diagnosis not present

## 2014-04-25 DIAGNOSIS — R5381 Other malaise: Secondary | ICD-10-CM

## 2014-04-25 DIAGNOSIS — I739 Peripheral vascular disease, unspecified: Secondary | ICD-10-CM

## 2014-04-25 DIAGNOSIS — Z79899 Other long term (current) drug therapy: Secondary | ICD-10-CM

## 2014-04-25 DIAGNOSIS — D689 Coagulation defect, unspecified: Secondary | ICD-10-CM | POA: Diagnosis not present

## 2014-04-25 DIAGNOSIS — Z01810 Encounter for preprocedural cardiovascular examination: Secondary | ICD-10-CM

## 2014-04-25 DIAGNOSIS — R5383 Other fatigue: Secondary | ICD-10-CM | POA: Diagnosis not present

## 2014-04-25 NOTE — Assessment & Plan Note (Signed)
The patient was referred to me because of potential mesenteric ischemia. She has chronic epigastric pain that gets worse on occasion but not necessarily related to food intake. She had renal Dopplers that also looked at her mesenteric arteries suggesting a celiac and SMA stenosis however a CT angiogram only  suggested in a celiac Axis stenosis.. Because of the discrepancy in the Doppler versus CT scan results as well as her chronic pain and going to proceed with angiography to image her celiac axis and mesenteric arteries. In addition, because she's having chest pain several times a week and has known CAD I will do simultaneous cardiac catheterization. I have discussed this with Dr. Ellyn Hack.

## 2014-04-25 NOTE — Patient Instructions (Signed)
Dr. Gwenlyn Found has ordered a peripheral angiogram (mesenteric artery angiogram and left heart catheterization) to be done at [facility].  This procedure is going to look at the bloodflow in your lower extremities.  If Dr. Gwenlyn Found is able to open up the arteries, you will have to spend one night in the hospital.  If he is not able to open the arteries, you will be able to go home that same day.    After the procedure, you will not be allowed to drive for 3 days or push, pull, or lift anything greater than 10 lbs for one week.    You will be required to have the following tests prior to the procedure:  1. Blood work-the blood work can be done no more than 7 days prior to the procedure.  It can be done at any [facility] lab.  There is one downstairs on the first floor of this building and one in the [place] ([address])  2. Chest Xray-the chest xray order has already been placed at the [place].     *REPS n/a

## 2014-04-25 NOTE — Progress Notes (Signed)
04/25/2014 [name]   1941-05-12  [id number]  Primary Physician Yong Channel, MD Primary Cardiologist: Lorretta Harp MD Renae Gloss   HPI:  [name] is a 73 year old moderately overweight married Caucasian female mother of one child referred to me by Dr. Ellyn Hack because of abdominal pain thought to be vascular in nature. She has a history of CAD status post multiple a cardiac stent in the past. She also has a history of hypertension and dyslipidemia. She's had renal artery stenting as well. Abdominal ultrasound revealed high-grade celiac access and SMA stenosis however CT angiogram performed date 04/05/14  only showed celiac axis disease.   Current Outpatient Prescriptions  Medication Sig Dispense Refill  . aspirin 325 MG tablet Take 325 mg by mouth daily.      . carvedilol (COREG) 12.5 MG tablet Take 1 tablet (12.5 mg total) by mouth 2 (two) times daily.  60 tablet  11  . clopidogrel (PLAVIX) 75 MG tablet Take 75 mg by mouth daily.      . furosemide (LASIX) 40 MG tablet Take 80mg  in the morning and 40 mg in the evening.  90 tablet  10  . gabapentin (NEURONTIN) 600 MG tablet Take 1,200 mg by mouth 3 (three) times daily.      Marland Kitchen lidocaine (XYLOCAINE) 5 % ointment Apply 1 application topically daily.      Marland Kitchen losartan (COZAAR) 100 MG tablet Take 1 tablet by mouth daily.      . metolazone (ZAROXOLYN) 2.5 MG tablet Take one tablet every other day 30 minutes before Furosemide. Call for an appointment for refills.  15 tablet  0  . nitroGLYCERIN (NITROSTAT) 0.4 MG SL tablet Place 0.4 mg under the tongue every 5 (five) minutes as needed for chest pain.      . Pancrelipase, Lip-Prot-Amyl, (ZENPEP) 20000 UNITS CPEP Take 2 capsules (40,000 Units total) by mouth 3 (three) times daily with meals.  270 capsule  3  . pantoprazole (PROTONIX) 40 MG tablet Take 1 tablet (40 mg total) by mouth daily before breakfast.  30 tablet  2   No current facility-administered medications for this  visit.    Allergies  Allergen Reactions  . Codeine Phosphate Swelling  . Lisinopril   . Statins     Intolerant   . Tramadol   . Iodine Swelling and Rash    REACTION: unspecified    History   Social History  . Marital Status: Married    Spouse Name: [name]    Number of Children: 1  . Years of Education: [name]   Occupational History  . gift shop owner    Social History Main Topics  . Smoking status: Never Smoker   . Smokeless tobacco: Never Used  . Alcohol Use: No  . Drug Use: No  . Sexual Activity: Not on file   Other Topics Concern  . Not on file   Social History Narrative   Married mother of one, one grandchild.   Very socially active, enjoys cooking and having get-togethers her house. She had been exercising regularly but her back pain limits her.   Does not smoke, does not drink alcohol.     Review of Systems: General: negative for chills, fever, night sweats or weight changes.  Cardiovascular: negative for chest pain, dyspnea on exertion, edema, orthopnea, palpitations, paroxysmal nocturnal dyspnea or shortness of breath Dermatological: negative for rash Respiratory: negative for cough or wheezing Urologic: negative for hematuria Abdominal: negative for nausea, vomiting, diarrhea, bright red  blood per rectum, melena, or hematemesis Neurologic: negative for visual changes, syncope, or dizziness All other systems reviewed and are otherwise negative except as noted above.    Blood pressure 130/60, pulse 88, height 5\' 8"  (1.727 m), weight 218 lb (98.884 kg).  General appearance: alert and no distress Neck: no adenopathy, no JVD, supple, symmetrical, trachea midline, thyroid not enlarged, symmetric, no tenderness/mass/nodules and left carotid bruit Lungs: clear to auscultation bilaterally Heart: regular rate and rhythm, S1, S2 normal, no murmur, click, rub or gallop Extremities: extremities normal, atraumatic, no cyanosis or edema  EKG not performed  today  ASSESSMENT AND PLAN:   Abdominal pain, epigastric The patient was referred to me because of potential mesenteric ischemia. She has chronic epigastric pain that gets worse on occasion but not necessarily related to food intake. She had renal Dopplers that also looked at her mesenteric arteries suggesting a celiac and SMA stenosis however a CT angiogram only  suggested in a celiac Axis stenosis.. Because of the discrepancy in the Doppler versus CT scan results as well as her chronic pain and going to proceed with angiography to image her celiac axis and mesenteric arteries. In addition, because she's having chest pain several times a week and has known CAD I will do simultaneous cardiac catheterization. I have discussed this with Dr. Ellyn Hack.      Lorretta Harp MD FACP,FACC,FAHA, [facility] 04/25/2014 5:35 PM

## 2014-04-26 ENCOUNTER — Encounter: Payer: Self-pay | Admitting: Cardiovascular Disease

## 2014-04-26 ENCOUNTER — Encounter: Payer: Self-pay | Admitting: *Deleted

## 2014-04-26 ENCOUNTER — Other Ambulatory Visit (HOSPITAL_COMMUNITY): Payer: Self-pay | Admitting: Cardiovascular Disease

## 2014-04-26 DIAGNOSIS — I701 Atherosclerosis of renal artery: Secondary | ICD-10-CM

## 2014-05-03 DIAGNOSIS — M545 Low back pain, unspecified: Secondary | ICD-10-CM | POA: Diagnosis not present

## 2014-05-04 IMAGING — US US ABDOMEN COMPLETE
1 series · 14 of 25 positions shown · non-contrast
Comparison: 04/15/2012

CLINICAL DATA: Abdominal pain

EXAM:
ULTRASOUND ABDOMEN COMPLETE

[Series 1: us abdomen complete · 0.18mm/px · 14 of 79 slices shown]
[im 1/79]
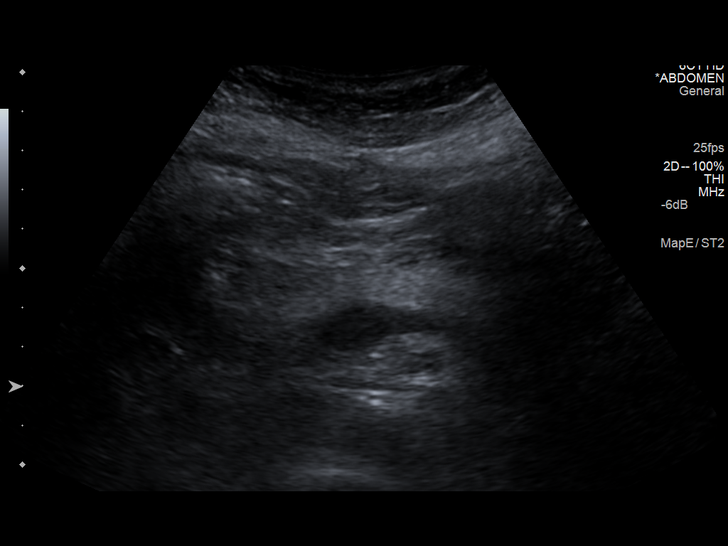
[im 7/79]
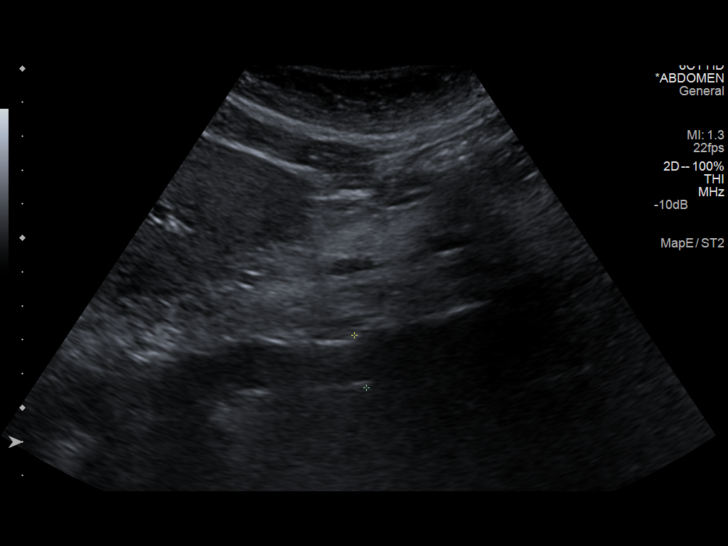
[im 14/79]
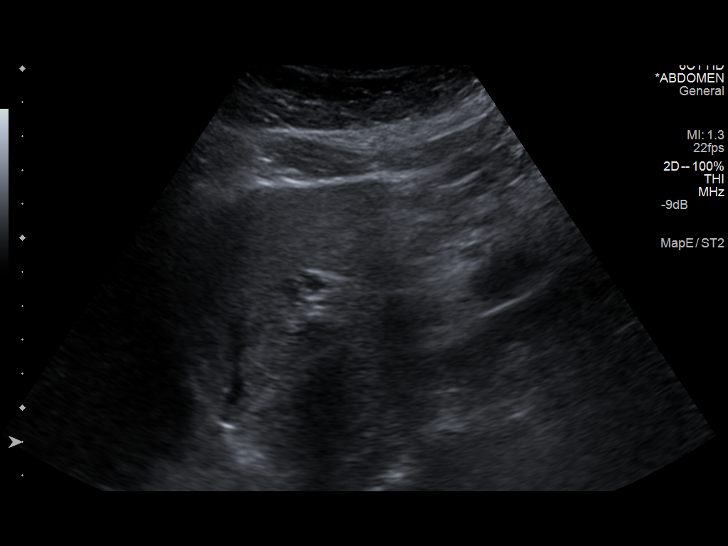
[im 20/79]
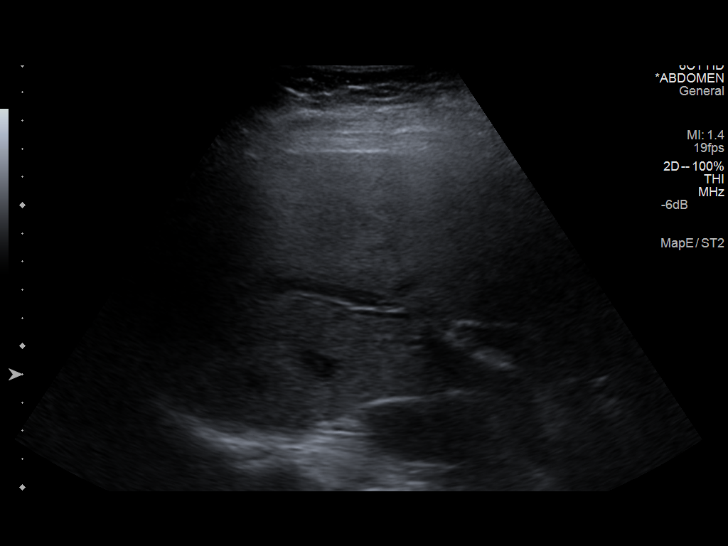
[im 27/79]
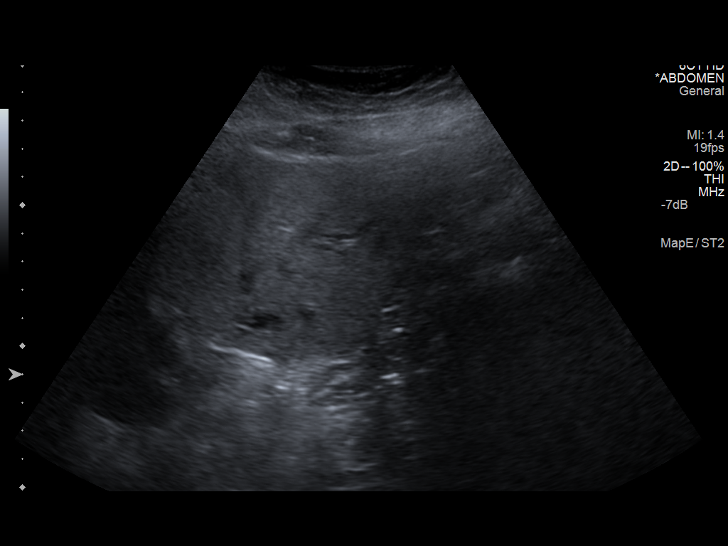
[im 30/79]
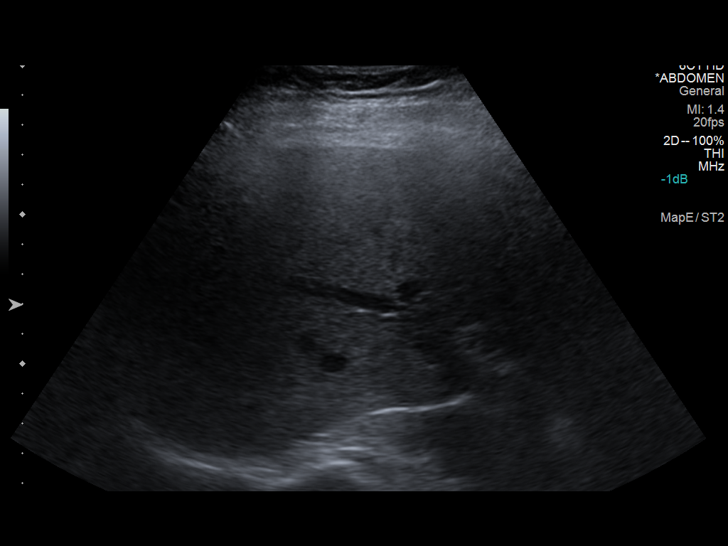
[im 36/79]
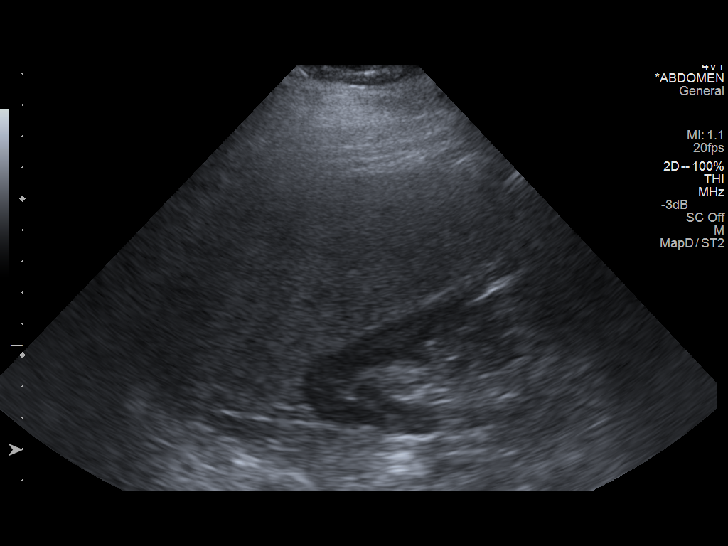
[im 43/79]
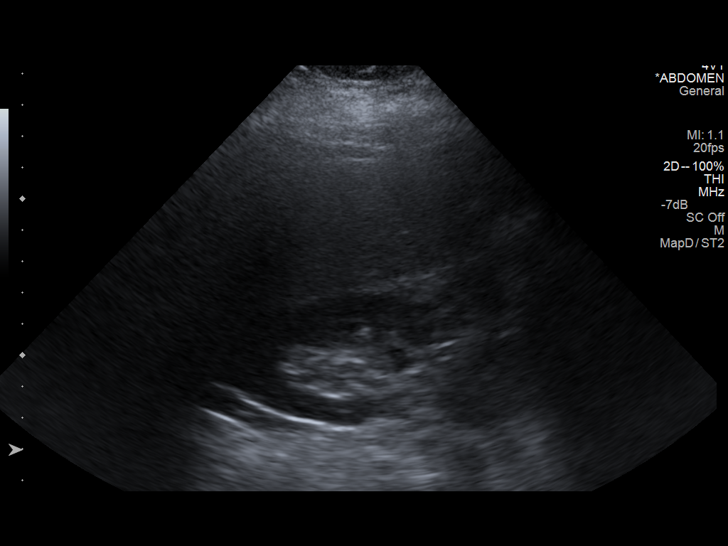
[im 49/79]
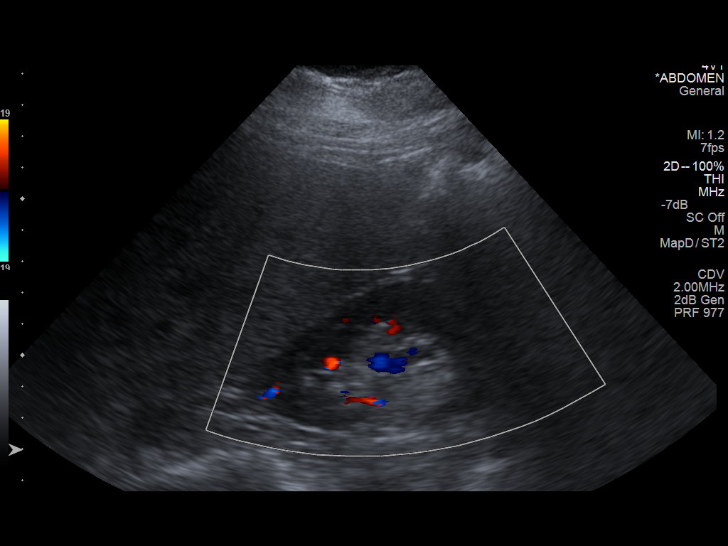
[im 53/79]
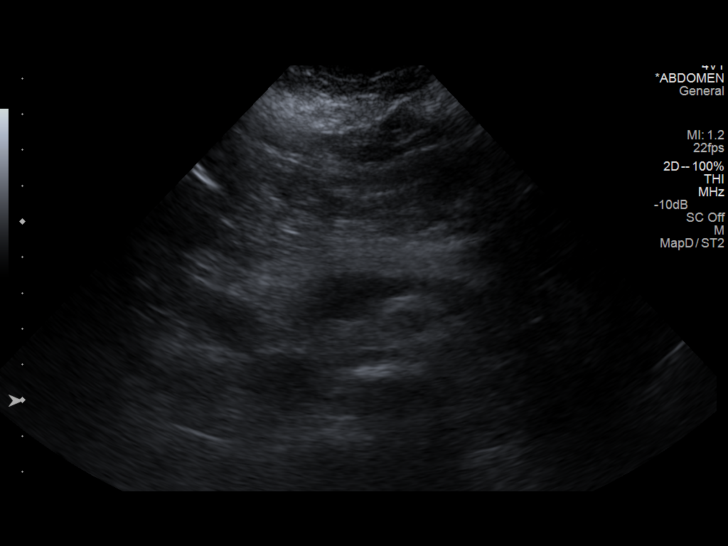
[im 59/79]
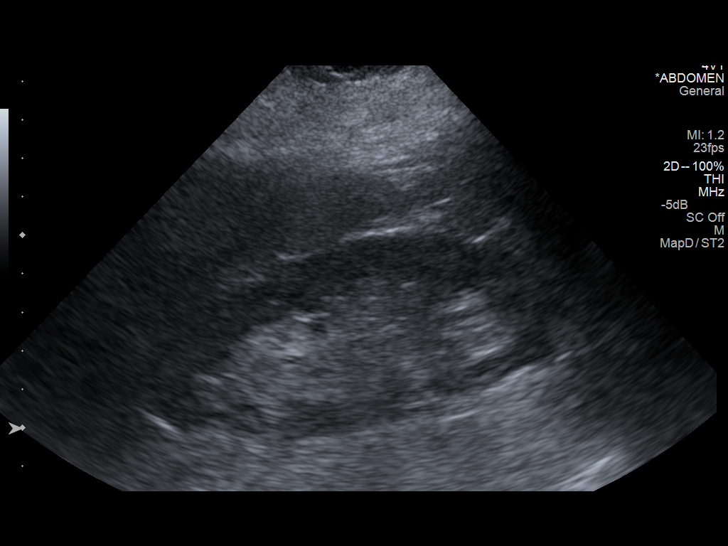
[im 66/79]
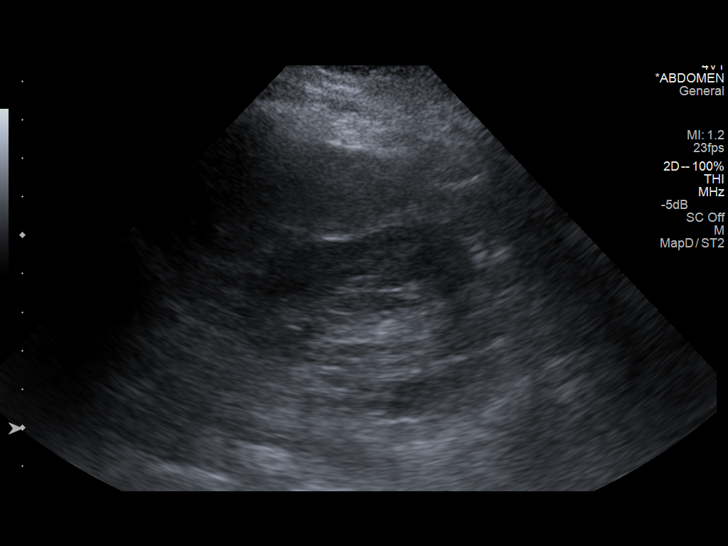
[im 72/79]
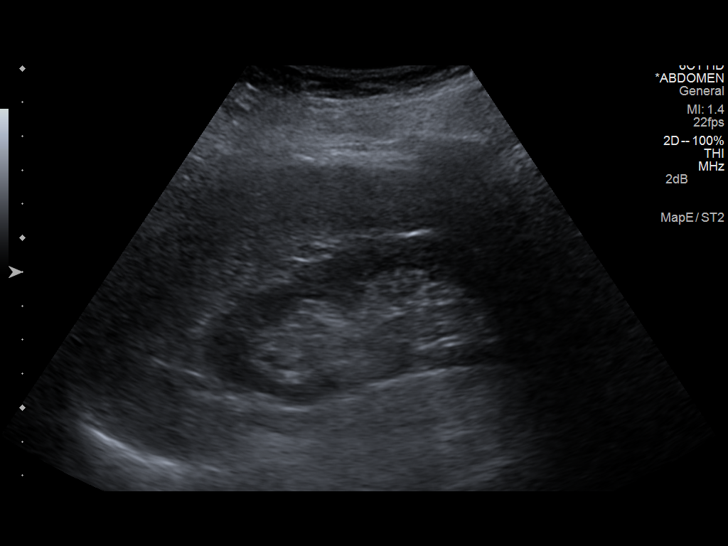
[im 79/79]
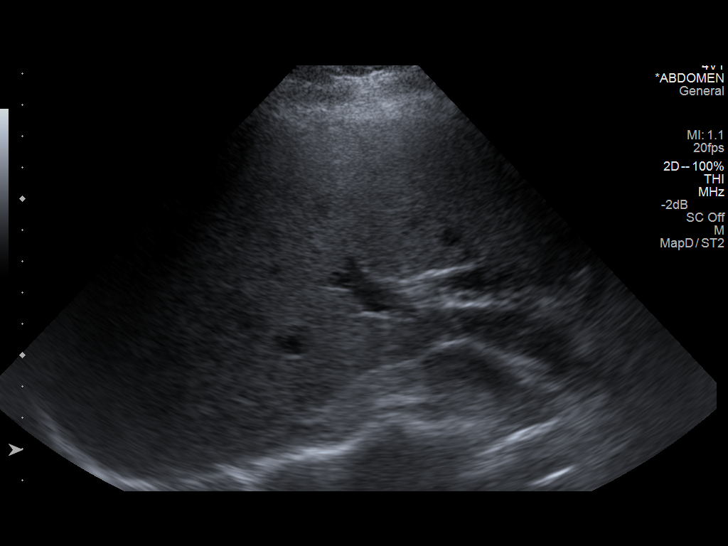

[14 of 25 positions shown; findings below may reference images not displayed]

FINDINGS: Gallbladder:

Surgically removed

Common bile duct:

Diameter: 10.5 mm. This is within normal limits for the post
cholecystectomy state.

Liver:

Increased in echogenicity consistent with fatty liver.

IVC:

No abnormality visualized.

Pancreas:

Visualized portion unremarkable.

Spleen:

Size and appearance within normal limits.

Right Kidney:

Length: 11.2 cm.. Echogenicity within normal limits. No mass or
hydronephrosis visualized.

Left Kidney:

Length: 11.2 cm.. Echogenicity within normal limits. No mass or
hydronephrosis visualized.

Abdominal aorta:

No aneurysm visualized.

Other findings:

None.
IMPRESSION: Status post cholecystectomy.

Fatty infiltration of the liver.

No acute abnormality is noted.

## 2014-05-04 NOTE — Progress Notes (Signed)
Thanks  I value your opinion.  [name]

## 2014-05-06 ENCOUNTER — Ambulatory Visit
Admission: RE | Admit: 2014-05-06 | Discharge: 2014-05-06 | Disposition: A | Discharge status: Home / Self Care | Payer: Medicare Other | Source: Ambulatory Visit | Attending: Cardiovascular Disease | Admitting: Cardiovascular Disease

## 2014-05-06 ENCOUNTER — Encounter (HOSPITAL_COMMUNITY): Payer: Self-pay | Admitting: Pharmacy Technician

## 2014-05-06 DIAGNOSIS — R5381 Other malaise: Secondary | ICD-10-CM | POA: Diagnosis not present

## 2014-05-06 DIAGNOSIS — Z01810 Encounter for preprocedural cardiovascular examination: Secondary | ICD-10-CM

## 2014-05-06 DIAGNOSIS — Z01818 Encounter for other preprocedural examination: Secondary | ICD-10-CM | POA: Diagnosis not present

## 2014-05-06 DIAGNOSIS — I739 Peripheral vascular disease, unspecified: Secondary | ICD-10-CM | POA: Diagnosis not present

## 2014-05-06 DIAGNOSIS — M7989 Other specified soft tissue disorders: Secondary | ICD-10-CM | POA: Diagnosis not present

## 2014-05-06 DIAGNOSIS — M79609 Pain in unspecified limb: Secondary | ICD-10-CM | POA: Diagnosis not present

## 2014-05-06 DIAGNOSIS — Z79899 Other long term (current) drug therapy: Secondary | ICD-10-CM | POA: Diagnosis not present

## 2014-05-06 DIAGNOSIS — D689 Coagulation defect, unspecified: Secondary | ICD-10-CM | POA: Diagnosis not present

## 2014-05-06 DIAGNOSIS — R5383 Other fatigue: Secondary | ICD-10-CM | POA: Diagnosis not present

## 2014-05-06 LAB — BASIC METABOLIC PANEL
BUN: 17 mg/dL (ref 6–23)
CHLORIDE: 98 meq/L (ref 96–112)
CO2: 31 mEq/L (ref 19–32)
Calcium: 9.5 mg/dL (ref 8.4–10.5)
Creat: 0.72 mg/dL (ref 0.50–1.10)
Glucose, Bld: 153 mg/dL — ABNORMAL HIGH (ref 70–99)
POTASSIUM: 3.6 meq/L (ref 3.5–5.3)
Sodium: 137 mEq/L (ref 135–145)

## 2014-05-06 LAB — TSH: TSH: 2.159 u[IU]/mL (ref 0.350–4.500)

## 2014-05-06 LAB — CBC
HCT: 35.8 % — ABNORMAL LOW (ref 36.0–46.0)
Hemoglobin: 12 g/dL (ref 12.0–15.0)
MCH: 28.2 pg (ref 26.0–34.0)
MCHC: 33.5 g/dL (ref 30.0–36.0)
MCV: 84.2 fL (ref 78.0–100.0)
Platelets: 258 10*3/uL (ref 150–400)
RBC: 4.25 MIL/uL (ref 3.87–5.11)
RDW: 13.7 % (ref 11.5–15.5)
WBC: 8.5 10*3/uL (ref 4.0–10.5)

## 2014-05-09 LAB — APTT: aPTT: 27 seconds (ref 24–37)

## 2014-05-09 LAB — PROTIME-INR
INR: 0.94 (ref <1.50)
Prothrombin Time: 12.6 seconds (ref 11.6–15.2)

## 2014-05-12 ENCOUNTER — Ambulatory Visit (HOSPITAL_COMMUNITY)
Admission: RE | Admit: 2014-05-12 | Discharge: 2014-05-12 | Disposition: A | Discharge status: Home / Self Care | Payer: Medicare Other | Source: Ambulatory Visit | Attending: Cardiovascular Disease | Admitting: Cardiovascular Disease

## 2014-05-12 ENCOUNTER — Encounter (HOSPITAL_COMMUNITY)
Admission: RE | Disposition: A | Discharge status: Home / Self Care | Payer: Self-pay | Source: Ambulatory Visit | Attending: Cardiovascular Disease

## 2014-05-12 DIAGNOSIS — E785 Hyperlipidemia, unspecified: Secondary | ICD-10-CM | POA: Insufficient documentation

## 2014-05-12 DIAGNOSIS — Z91041 Radiographic dye allergy status: Secondary | ICD-10-CM | POA: Diagnosis not present

## 2014-05-12 DIAGNOSIS — I739 Peripheral vascular disease, unspecified: Secondary | ICD-10-CM

## 2014-05-12 DIAGNOSIS — Z9861 Coronary angioplasty status: Secondary | ICD-10-CM | POA: Insufficient documentation

## 2014-05-12 DIAGNOSIS — Z7902 Long term (current) use of antithrombotics/antiplatelets: Secondary | ICD-10-CM | POA: Insufficient documentation

## 2014-05-12 DIAGNOSIS — I774 Celiac artery compression syndrome: Secondary | ICD-10-CM | POA: Insufficient documentation

## 2014-05-12 DIAGNOSIS — I2510 Atherosclerotic heart disease of native coronary artery without angina pectoris: Secondary | ICD-10-CM | POA: Diagnosis not present

## 2014-05-12 DIAGNOSIS — R1013 Epigastric pain: Secondary | ICD-10-CM | POA: Diagnosis not present

## 2014-05-12 DIAGNOSIS — R079 Chest pain, unspecified: Secondary | ICD-10-CM | POA: Insufficient documentation

## 2014-05-12 DIAGNOSIS — I701 Atherosclerosis of renal artery: Secondary | ICD-10-CM | POA: Diagnosis not present

## 2014-05-12 DIAGNOSIS — E663 Overweight: Secondary | ICD-10-CM | POA: Diagnosis not present

## 2014-05-12 DIAGNOSIS — D689 Coagulation defect, unspecified: Secondary | ICD-10-CM

## 2014-05-12 DIAGNOSIS — R5381 Other malaise: Secondary | ICD-10-CM

## 2014-05-12 DIAGNOSIS — Z01810 Encounter for preprocedural cardiovascular examination: Secondary | ICD-10-CM

## 2014-05-12 DIAGNOSIS — I10 Essential (primary) hypertension: Secondary | ICD-10-CM | POA: Diagnosis not present

## 2014-05-12 DIAGNOSIS — R5383 Other fatigue: Secondary | ICD-10-CM

## 2014-05-12 DIAGNOSIS — Z7982 Long term (current) use of aspirin: Secondary | ICD-10-CM | POA: Diagnosis not present

## 2014-05-12 DIAGNOSIS — K551 Chronic vascular disorders of intestine: Secondary | ICD-10-CM | POA: Diagnosis not present

## 2014-05-12 DIAGNOSIS — Z79899 Other long term (current) drug therapy: Secondary | ICD-10-CM

## 2014-05-12 HISTORY — PX: LEFT HEART CATHETERIZATION WITH CORONARY ANGIOGRAM: SHX5451

## 2014-05-12 HISTORY — PX: VISCERAL ANGIOGRAM: SHX5515

## 2014-05-12 LAB — GLUCOSE, CAPILLARY: GLUCOSE-CAPILLARY: 132 mg/dL — AB (ref 70–99)

## 2014-05-12 SURGERY — VISCERAL ANGIOGRAM
Anesthesia: LOCAL

## 2014-05-12 MED ORDER — CLOPIDOGREL BISULFATE 75 MG PO TABS: 75.0000 mg | ORAL_TABLET | Freq: Every day | ORAL | Status: DC

## 2014-05-12 MED ORDER — MIDAZOLAM HCL 2 MG/2ML IJ SOLN
INTRAMUSCULAR | Status: AC
  Filled 2014-05-12: qty 2

## 2014-05-12 MED ORDER — SODIUM CHLORIDE 0.9 % IV SOLN: INTRAVENOUS | Status: AC

## 2014-05-12 MED ORDER — HEPARIN (PORCINE) IN NACL 2-0.9 UNIT/ML-% IJ SOLN
INTRAMUSCULAR | Status: AC
  Filled 2014-05-12: qty 1000

## 2014-05-12 MED ORDER — ASPIRIN 81 MG PO CHEW
CHEWABLE_TABLET | ORAL | Status: AC
  Administered 2014-05-12: 81 mg via ORAL
  Filled 2014-05-12: qty 1

## 2014-05-12 MED ORDER — LIDOCAINE HCL (PF) 1 % IJ SOLN
INTRAMUSCULAR | Status: AC
  Filled 2014-05-12: qty 30

## 2014-05-12 MED ORDER — ASPIRIN 81 MG PO CHEW: 81.0000 mg | CHEWABLE_TABLET | Freq: Every day | ORAL | Status: DC

## 2014-05-12 MED ORDER — METHYLPREDNISOLONE SODIUM SUCC 125 MG IJ SOLR
60.0000 mg | INTRAMUSCULAR | Status: AC
  Administered 2014-05-12: 60 mg via INTRAVENOUS

## 2014-05-12 MED ORDER — FAMOTIDINE IN NACL 20-0.9 MG/50ML-% IV SOLN
INTRAVENOUS | Status: AC
  Administered 2014-05-12: 20 mg via INTRAVENOUS
  Filled 2014-05-12: qty 50

## 2014-05-12 MED ORDER — DIPHENHYDRAMINE HCL 50 MG/ML IJ SOLN
INTRAMUSCULAR | Status: AC
  Administered 2014-05-12: 25 mg via INTRAVENOUS
  Filled 2014-05-12: qty 1

## 2014-05-12 MED ORDER — ASPIRIN 81 MG PO CHEW
81.0000 mg | CHEWABLE_TABLET | ORAL | Status: AC
  Administered 2014-05-12: 81 mg via ORAL

## 2014-05-12 MED ORDER — SODIUM CHLORIDE 0.9 % IJ SOLN: 3.0000 mL | INTRAMUSCULAR | Status: DC | PRN

## 2014-05-12 MED ORDER — SODIUM CHLORIDE 0.9 % IV SOLN
INTRAVENOUS | Status: DC
  Administered 2014-05-12: 08:00:00 via INTRAVENOUS

## 2014-05-12 MED ORDER — FAMOTIDINE IN NACL 20-0.9 MG/50ML-% IV SOLN
20.0000 mg | INTRAVENOUS | Status: AC
  Administered 2014-05-12: 20 mg via INTRAVENOUS

## 2014-05-12 MED ORDER — ONDANSETRON HCL 4 MG/2ML IJ SOLN: 4.0000 mg | Freq: Four times a day (QID) | INTRAMUSCULAR | Status: DC | PRN

## 2014-05-12 MED ORDER — METHYLPREDNISOLONE SODIUM SUCC 125 MG IJ SOLR
INTRAMUSCULAR | Status: AC
  Administered 2014-05-12: 60 mg via INTRAVENOUS
  Filled 2014-05-12: qty 2

## 2014-05-12 MED ORDER — DIPHENHYDRAMINE HCL 50 MG/ML IJ SOLN
25.0000 mg | INTRAMUSCULAR | Status: AC
  Administered 2014-05-12: 25 mg via INTRAVENOUS

## 2014-05-12 MED ORDER — ACETAMINOPHEN 325 MG PO TABS: 650.0000 mg | ORAL_TABLET | ORAL | Status: DC | PRN

## 2014-05-12 MED ORDER — FENTANYL CITRATE 0.05 MG/ML IJ SOLN
INTRAMUSCULAR | Status: AC
  Filled 2014-05-12: qty 2

## 2014-05-12 MED ORDER — NITROGLYCERIN 1 MG/10 ML FOR IR/CATH LAB
INTRA_ARTERIAL | Status: AC
  Filled 2014-05-12: qty 10

## 2014-05-12 NOTE — Discharge Instructions (Signed)
Angiogram, Care After °Refer to this sheet in the next few weeks. These instructions provide you with information on caring for yourself after your procedure. Your health care provider may also give you more specific instructions. Your treatment has been planned according to current medical practices, but problems sometimes occur. Call your health care provider if you have any problems or questions after your procedure.  °WHAT TO EXPECT AFTER THE PROCEDURE °After your procedure, it is typical to have the following sensations: °· Minor discomfort or tenderness and a small bump at the catheter insertion site. The bump should usually decrease in size and tenderness within 1 to 2 weeks. °· Any bruising will usually fade within 2 to 4 weeks. °HOME CARE INSTRUCTIONS  °· You may need to keep taking blood thinners if they were prescribed for you. Take medicines only as directed by your health care provider. °· Do not apply powder or lotion to the site. °· Do not take baths, swim, or use a hot tub until your health care provider approves. °· You may shower 24 hours after the procedure. Remove the bandage (dressing) and gently wash the site with plain soap and water. Gently pat the site dry. °· Inspect the site at least twice daily. °· Limit your activity for the first 48 hours. Do not bend, squat, or lift anything over 20 lb (9 kg) or as directed by your health care provider. °· Plan to have someone take you home after the procedure. Follow instructions about when you can drive or return to work. °SEEK MEDICAL CARE IF: °· You get light-headed when standing up. °· You have drainage (other than a small amount of blood on the dressing). °· You have chills. °· You have a fever. °· You have redness, warmth, swelling, or pain at the insertion site. °SEEK IMMEDIATE MEDICAL CARE IF:  °· You develop chest pain or shortness of breath, feel faint, or pass out. °· You have bleeding, swelling larger than a walnut, or drainage from the  catheter insertion site. °· You develop pain, discoloration, coldness, or severe bruising in the leg or arm that held the catheter. °· You have heavy bleeding from the site. If this happens, hold pressure on the site and call 911. °MAKE SURE YOU: °· Understand these instructions. °· Will watch your condition. °· Will get help right away if you are not doing well or get worse. °Document Released: 03/07/2005 Document Revised: 01/03/2014 Document Reviewed: 01/11/2013 °Exit[employer]® Patient Information ©2015 [employer]. This information is not intended to replace advice given to you by your health care provider. Make sure you discuss any questions you have with your health care provider. ° °

## 2014-05-12 NOTE — Interval H&P Note (Signed)
Cath Lab Visit (complete for each Cath Lab visit)  Clinical Evaluation Leading to the Procedure:   ACS: No.  Non-ACS:    Anginal Classification: CCS III  Anti-ischemic medical therapy: Minimal Therapy (1 class of medications)  Non-Invasive Test Results: No non-invasive testing performed  Prior CABG: No previous CABG      History and Physical Interval Note:  05/12/2014 9:21 AM  [name]  has presented today for surgery, with the diagnosis of pad  The various methods of treatment have been discussed with the patient and family. After consideration of risks, benefits and other options for treatment, the patient has consented to  Procedure(s): VISCERAL ANGIOGRAM (N/A) LEFT HEART CATHETERIZATION WITH CORONARY ANGIOGRAM (N/A) as a surgical intervention .  The patient's history has been reviewed, patient examined, no change in status, stable for surgery.  I have reviewed the patient's chart and labs.  Questions were answered to the patient's satisfaction.     Lorretta Harp

## 2014-05-12 NOTE — H&P (View-Only) (Signed)
04/25/2014 [name]   [birth year]  [id number]  Primary Physician Yong Channel, MD Primary Cardiologist: Lorretta Harp MD Renae Gloss   HPI:  [name] is a 73 year old moderately overweight married Caucasian female mother of one child referred to me by Dr. Ellyn Hack because of abdominal pain thought to be vascular in nature. She has a history of CAD status post multiple a cardiac stent in the past. She also has a history of hypertension and dyslipidemia. She's had renal artery stenting as well. Abdominal ultrasound revealed high-grade celiac access and SMA stenosis however CT angiogram performed date 04/05/14  only showed celiac axis disease.   Current Outpatient Prescriptions  Medication Sig Dispense Refill  . aspirin 325 MG tablet Take 325 mg by mouth daily.      . carvedilol (COREG) 12.5 MG tablet Take 1 tablet (12.5 mg total) by mouth 2 (two) times daily.  60 tablet  11  . clopidogrel (PLAVIX) 75 MG tablet Take 75 mg by mouth daily.      . furosemide (LASIX) 40 MG tablet Take 80mg  in the morning and 40 mg in the evening.  90 tablet  10  . gabapentin (NEURONTIN) 600 MG tablet Take 1,200 mg by mouth 3 (three) times daily.      Marland Kitchen lidocaine (XYLOCAINE) 5 % ointment Apply 1 application topically daily.      Marland Kitchen losartan (COZAAR) 100 MG tablet Take 1 tablet by mouth daily.      . metolazone (ZAROXOLYN) 2.5 MG tablet Take one tablet every other day 30 minutes before Furosemide. Call for an appointment for refills.  15 tablet  0  . nitroGLYCERIN (NITROSTAT) 0.4 MG SL tablet Place 0.4 mg under the tongue every 5 (five) minutes as needed for chest pain.      . Pancrelipase, Lip-Prot-Amyl, (ZENPEP) 20000 UNITS CPEP Take 2 capsules (40,000 Units total) by mouth 3 (three) times daily with meals.  270 capsule  3  . pantoprazole (PROTONIX) 40 MG tablet Take 1 tablet (40 mg total) by mouth daily before breakfast.  30 tablet  2   No current facility-administered medications for this  visit.    Allergies  Allergen Reactions  . Codeine Phosphate Swelling  . Lisinopril   . Statins     Intolerant   . Tramadol   . Iodine Swelling and Rash    REACTION: unspecified    History   Social History  . Marital Status: Married    Spouse Name: N/A    Number of Children: 1  . Years of Education: N/A   Occupational History  . gift shop owner    Social History Main Topics  . Smoking status: Never Smoker   . Smokeless tobacco: Never Used  . Alcohol Use: No  . Drug Use: No  . Sexual Activity: Not on file   Other Topics Concern  . Not on file   Social History Narrative   Married mother of one, one grandchild.   Very socially active, enjoys cooking and having get-togethers her house. She had been exercising regularly but her back pain limits her.   Does not smoke, does not drink alcohol.     Review of Systems: General: negative for chills, fever, night sweats or weight changes.  Cardiovascular: negative for chest pain, dyspnea on exertion, edema, orthopnea, palpitations, paroxysmal nocturnal dyspnea or shortness of breath Dermatological: negative for rash Respiratory: negative for cough or wheezing Urologic: negative for hematuria Abdominal: negative for nausea, vomiting, diarrhea, bright red  blood per rectum, melena, or hematemesis Neurologic: negative for visual changes, syncope, or dizziness All other systems reviewed and are otherwise negative except as noted above.    Blood pressure 130/60, pulse 88, height 5\' 8"  (1.727 m), weight 218 lb (98.884 kg).  General appearance: alert and no distress Neck: no adenopathy, no JVD, supple, symmetrical, trachea midline, thyroid not enlarged, symmetric, no tenderness/mass/nodules and left carotid bruit Lungs: clear to auscultation bilaterally Heart: regular rate and rhythm, S1, S2 normal, no murmur, click, rub or gallop Extremities: extremities normal, atraumatic, no cyanosis or edema  EKG not performed  today  ASSESSMENT AND PLAN:   Abdominal pain, epigastric The patient was referred to me because of potential mesenteric ischemia. She has chronic epigastric pain that gets worse on occasion but not necessarily related to food intake. She had renal Dopplers that also looked at her mesenteric arteries suggesting a celiac and SMA stenosis however a CT angiogram only  suggested in a celiac Axis stenosis.. Because of the discrepancy in the Doppler versus CT scan results as well as her chronic pain and going to proceed with angiography to image her celiac axis and mesenteric arteries. In addition, because she's having chest pain several times a week and has known CAD I will do simultaneous cardiac catheterization. I have discussed this with Dr. Ellyn Hack.      Lorretta Harp MD FACP,FACC,FAHA, [facility] 04/25/2014 5:35 PM

## 2014-05-12 NOTE — CV Procedure (Signed)
[name] is a 73 y.o. female    [id number] LOCATION:  FACILITY: [facility]  PHYSICIAN: Quay Burow, M.D. [birth year]   DATE OF PROCEDURE:  05/12/2014  DATE OF DISCHARGE:     PV Angiogram/Intervention    History obtained from chart review.[name] is a 73 year old moderately overweight married Caucasian female mother of one child referred to me by Dr. Ellyn Hack because of abdominal pain thought to be vascular in nature. She has a history of CAD status post multiple cardiac stents in the past. She also has a history of hypertension and dyslipidemia. She's had left renal artery stenting as well. Abdominal ultrasound revealed high-grade celiac access and SMA stenosis however CT angiogram performed date 04/05/14 only showed celiac axis disease. The patient presents today for abdominal angiography simply looking at her mesenteric arteries and renal arteries as well as coronary angiography to define her anatomy because of chest pain and known CAD in the past.    PROCEDURE DESCRIPTION:   The patient was brought to the second floor [place] [facility]iac cath lab in the postabsorptive state. She was premedicated with Valium 5 mg by mouth, IV thiamine et al., Benadryl and Pepcid for contrast allergy prophylaxis. She also received IV fentanyl and Versed.. Her right groinwas prepped and shaved in usual sterile fashion. Xylocaine 1% was used for local anesthesia. A 5 French sheath was inserted into the right common femoral artery using standard Seldinger technique.a 5 French pigtail catheter and right Judkins catheter were used for abdominal aortography in the PA and lateral views as well as selective renal angiography. Omnipaque dye wishes to the entirety of the case. Retrograde aortic pressure was monitored during the case.   HEMODYNAMICS:    AO SYSTOLIC/AO DIASTOLIC: 147/82   Angiographic Data:   1: Mesenteric arteries-95% ostial/proximal celiac axilla acidosis with a downward takeoff, 20-30%  proximal SMA stenosis and 50-60% proximal IMA stenosis which fills of the celiac axis.  2: Renal arteries-the left renal artery stent is widely patent. The right renal artery has 20-30% proximal stenosis.    IMPRESSION:Ms. Mccaig has a high-grade ostial/proximal celiac axis with a widely patent SMA. She does have moderate IMA disease but I'm not convinced her that her anatomy supports severe mesenteric ischemia. I suggest conservative care. The sheath was removed and pressure was held on the groin to achieve hemostasis. The patient left the lab in stable condition. She'll be hydrated and discharged home. She will followup with Dr. Glenetta Hew.    Lorretta Harp MD, [facility] 05/12/2014 10:19 AM

## 2014-05-12 NOTE — CV Procedure (Signed)
CARY WILFORD is a 72 y.o. female    [id number] LOCATION:  FACILITY: [place]  PHYSICIAN: Quay Burow, M.D. Apr 17, 1941   DATE OF PROCEDURE:  05/12/2014  DATE OF DISCHARGE:     CARDIAC CATHETERIZATION     History obtained from chart review.Ms. Pollok is a 73 year old moderately overweight married Caucasian female mother of one child referred to me by Dr. Ellyn Hack because of abdominal pain thought to be vascular in nature. She has a history of CAD status post multiple cardiac stents in the past. She also has a history of hypertension and dyslipidemia. She's had left renal artery stenting as well. Abdominal ultrasound revealed high-grade celiac access and SMA stenosis however CT angiogram performed date 04/05/14 only showed celiac axis disease. The patient presents today for abdominal angiography simply looking at her mesenteric arteries and renal arteries as well as coronary angiography to define her anatomy because of chest pain and known CAD in the past.    PROCEDURE DESCRIPTION:   The patient was brought to the second floor [place] Cardiac cath lab in the postabsorptive state. She was premedicated with Valium 5 mg by mouth, IV submental, Benadryl and Pepcid for contrast allergy prophylaxis as well as IV Versed and fentanyl. Her right groinwas prepped and shaved in usual sterile fashion. Xylocaine 1% was used for local anesthesia. A 5 French sheath was inserted into the right common femoral artery using standard Seldinger technique. 5 French right and left Judkins diagnostic catheters were used for selective coronary angiography. Omnipaque dye was used for the entirety of the case. Retrograde aortic pressure was monitored during the case.   HEMODYNAMICS:    AO SYSTOLIC/AO DIASTOLIC: 038/33    ANGIOGRAPHIC RESULTS:   1. Left main; normal  2. LAD; widely patent stents and otherwise normal 3. Left circumflex; nondominant and normal.  4. Right coronary artery; dominant with widely  patent stents 5. Left ventriculography;not performed today  IMPRESSION:Ms. Lambert has widely patent stents with normal coronary arteries. There is no anatomic coronary related reason for her to be having chest pain suggesting a noncardiac etiology. The sheath was removed and pressure was held on the groin to achieve hemostasis. The patient left the lab in stable condition. She will remain recumbent for 4 hours he'll be gently hydrated. She will followup with Dr. Glenetta Hew.  Lorretta Harp MD, [facility] 05/12/2014 10:25 AM

## 2014-05-12 NOTE — Progress Notes (Signed)
Site area: right groin Site Prior to Removal:  Level 0 Pressure Applied For: 25 minutes Manual:   yes Patient Status During Pull:  stable Post Pull Site:  Level 0 Post Pull Instructions Given:  yes Post Pull Pulses Present: yes and remained palpable with pressure held for sheath pull Dressing Applied:  Tegaderm Bedrest begins @ 1050 Comments: No complications

## 2014-05-19 ENCOUNTER — Telehealth: Payer: Self-pay | Admitting: Cardiology

## 2014-05-19 DIAGNOSIS — E1142 Type 2 diabetes mellitus with diabetic polyneuropathy: Secondary | ICD-10-CM | POA: Diagnosis not present

## 2014-05-19 DIAGNOSIS — M21619 Bunion of unspecified foot: Secondary | ICD-10-CM | POA: Diagnosis not present

## 2014-05-19 DIAGNOSIS — E1149 Type 2 diabetes mellitus with other diabetic neurological complication: Secondary | ICD-10-CM | POA: Diagnosis not present

## 2014-05-19 DIAGNOSIS — L84 Corns and callosities: Secondary | ICD-10-CM | POA: Diagnosis not present

## 2014-05-19 NOTE — Telephone Encounter (Signed)
She is not scheduled for an Echo, only a Renal Doppler

## 2014-05-19 NOTE — Telephone Encounter (Signed)
Does not need renal doppler study.  Left message to let patient know we will cancel.

## 2014-05-19 NOTE — Telephone Encounter (Signed)
She does not need renal dopplers since I recently did an angio and imaged the renal arteries. I can't speak to the echo since I'm not her cardiologust Ellyn Hack is).  JJB

## 2014-05-19 NOTE — Telephone Encounter (Signed)
Renal doppler scheduled 9/22 - is this needed since she had the PV cath 05/12/14?

## 2014-05-19 NOTE — Telephone Encounter (Signed)
Pt says she had a cath on last Thursday.Shewants to know if she still need the echo next Tuesday? If not at [facility] call her on her cell phone-[phone].

## 2014-05-24 ENCOUNTER — Encounter (HOSPITAL_COMMUNITY): Payer: Medicare Other

## 2014-05-25 ENCOUNTER — Ambulatory Visit: Payer: Medicare Other | Admitting: Cardiology

## 2014-05-31 DIAGNOSIS — M545 Low back pain, unspecified: Secondary | ICD-10-CM | POA: Diagnosis not present

## 2014-05-31 DIAGNOSIS — M461 Sacroiliitis, not elsewhere classified: Secondary | ICD-10-CM | POA: Diagnosis not present

## 2014-05-31 DIAGNOSIS — M961 Postlaminectomy syndrome, not elsewhere classified: Secondary | ICD-10-CM | POA: Diagnosis not present

## 2014-05-31 DIAGNOSIS — M47817 Spondylosis without myelopathy or radiculopathy, lumbosacral region: Secondary | ICD-10-CM | POA: Diagnosis not present

## 2014-06-06 ENCOUNTER — Encounter: Payer: Self-pay | Admitting: Cardiology

## 2014-06-06 ENCOUNTER — Ambulatory Visit (INDEPENDENT_AMBULATORY_CARE_PROVIDER_SITE_OTHER): Payer: Medicare Other | Admitting: Cardiology

## 2014-06-06 VITALS — BP 128/72 | HR 81 | Ht 68.0 in | Wt 215.5 lb

## 2014-06-06 DIAGNOSIS — I5030 Unspecified diastolic (congestive) heart failure: Secondary | ICD-10-CM

## 2014-06-06 DIAGNOSIS — I10 Essential (primary) hypertension: Secondary | ICD-10-CM

## 2014-06-06 DIAGNOSIS — K551 Chronic vascular disorders of intestine: Secondary | ICD-10-CM | POA: Diagnosis not present

## 2014-06-06 DIAGNOSIS — E669 Obesity, unspecified: Secondary | ICD-10-CM

## 2014-06-06 DIAGNOSIS — I2510 Atherosclerotic heart disease of native coronary artery without angina pectoris: Secondary | ICD-10-CM | POA: Diagnosis not present

## 2014-06-06 DIAGNOSIS — E66811 Obesity, class 1: Secondary | ICD-10-CM

## 2014-06-06 DIAGNOSIS — I771 Stricture of artery: Secondary | ICD-10-CM

## 2014-06-06 DIAGNOSIS — Z9861 Coronary angioplasty status: Secondary | ICD-10-CM

## 2014-06-06 DIAGNOSIS — I774 Celiac artery compression syndrome: Secondary | ICD-10-CM

## 2014-06-06 DIAGNOSIS — I701 Atherosclerosis of renal artery: Secondary | ICD-10-CM

## 2014-06-06 DIAGNOSIS — E785 Hyperlipidemia, unspecified: Secondary | ICD-10-CM | POA: Diagnosis not present

## 2014-06-06 MED ORDER — NITROGLYCERIN 0.4 MG SL SUBL: 0.4000 mg | SUBLINGUAL_TABLET | SUBLINGUAL | Status: DC | PRN

## 2014-06-06 NOTE — Patient Instructions (Signed)
Take losartan  In the middle of the day. If blood pressure is less than  120 (top number),hold losartan that day.    Your physician wants you to follow-up in 6 month Dr Ellyn Hack  30 min.. You will receive a reminder letter in the mail two months in advance. If you don't receive a letter, please call our office to schedule the follow-up appointment.

## 2014-06-10 ENCOUNTER — Encounter: Payer: Self-pay | Admitting: Cardiology

## 2014-06-10 DIAGNOSIS — E669 Obesity, unspecified: Secondary | ICD-10-CM | POA: Insufficient documentation

## 2014-06-10 NOTE — Assessment & Plan Note (Signed)
Weight seems to be going the wrong direction.  Needs to pick up exercise level & work on dietary modification.

## 2014-06-10 NOTE — Assessment & Plan Note (Addendum)
Stents looked patent with no obvious disease by recent cardiac cath - to explain her Sx which are quite atypical.   Continue DAPT, BB & ARB.   ? Consider nitrate vs. Ranexa for ? Microvascular disease.

## 2014-06-10 NOTE — Assessment & Plan Note (Signed)
Patent by cath.

## 2014-06-10 NOTE — Assessment & Plan Note (Signed)
Discussed referring to Pharm D - to discuss possible PCSK-9 Inhibitor options -- ? SPIRE TRIAL.

## 2014-06-10 NOTE — Assessment & Plan Note (Signed)
Stable - LE edema with Sliding scale.  No CHF symptoms, but some exertional "angina" chest pain could be microvascular disease with diastolic dysfunction. Did not tolerate increased BB dose.  BP actually looks better today.

## 2014-06-10 NOTE — Assessment & Plan Note (Signed)
Referred to Dr. Gwenlyn Found @ request of Dr. Carlean Purl (GI) -- per d/w both Dr. Gwenlyn Found & Dr. Trula Slade, med Rx is best management for Celiac disease which is not usually associated with post-prandial abdominal pain (esp. With patent SMA & IMA)

## 2014-06-10 NOTE — Progress Notes (Signed)
PCP: Yong Channel, MD  Clinic Note: Chief Complaint  Patient presents with  . Follow-up    chest pains that go away after sitting down awhile, shortness of breath on exertion, ankles always swollen, dizzy/stagering spells in the last few weeks.    HPI: Tricia RISSLER is a 73 y.o. female with a PMH below who presents today for ~3 month follow-up.  When I last saw her in June, I referred her to Dr. Quay Burow for PV Angiography to evaluate her ? Mesenteric ischemia as a potential cause of R side flank/abdominal pain (she also saw Dr. Trula Slade - who deferred to Dr. Gwenlyn Found).  Because of her c/o CP during this initial visit with Dr. Gwenlyn Found, he decided to do Coronary angiography as well -- no change, with patent LAD & RCA stents.  PV revealed ~95% ostial Celiac Trunk stenosis & mild SMA disease.  Dr. Gwenlyn Found (in conferring with Dr. Trula Slade) determined that medical therapy is the most appropriate COA. At her last visit, my intention was to increase her BB to 12.5 mg bid -- she did not tolerate this & cut it back.  Past Medical History  Diagnosis Date  . CAD S/P percutaneous coronary angioplasty     a) LHC: 07/30/10. -- 3.0x69mm Integrity BMS to pRCA & 2.5x 20mm Integrity BMS mLAD(@ D2).  b) Class III-IV Angina 03/2011: LHC- ISR in LAD BMS -- prox overlapping Promus DES 2.5x55mm and PTCA of jailed D2 ostium-prox 80%. c) 02/03/12:  LHC- patent stents.  Jailed diagonal. with stable flow; d) Peri-OP NSTEMI 04/2012 - LHC in 12/'13 -   . Congestive heart failure with LV diastolic dysfunction, NYHA class 2     06/13/10:  last 2D echo-  EF >55%, Mild TR, Mod Conc LVH - Grade 1 diastolic dysfunction (abnormal relaxation) --> LVEDP on Cath 28 mmHg & mild 2nd Pulm HTN  . Renal artery stenosis 2011; 12/'13    a) Angiogram 02/03/12:  50-60%L RA stenosis, 40% R R Inferior artery; b) 12/'13: S/P L RA Stent (High Pt. Reg) 6.0 mm x 15 mm; c) Renal Duplex 10/2013: <60% L RA, <60 R RA, ~60% SMA & Celiac A.  . Labile essential  hypertension     Partially related to RAS  . Dyslipidemia, goal LDL below 70     Intolerant to statins  . NSTEMI (non-ST elevated myocardial infarction) 04/2012    Unclear the details, apparently this was postoperative from her back surgery that she was cleared for my last saw her in June.  Reportedly had stents placed  . Bilateral edema of lower extremity     Chronic, likely related to venous stasis  . Irritable bowel syndrome (IBS)   . Diverticulitis of colon (without mention of hemorrhage)   . Hepatomegaly   . Pancreas divisum     On pancreatic enzyme  . Calculus of kidney   . Unspecified disorder of thyroid   . Back pain with radiation     Chronic Back Pain - mutliple surgeries (including tumor removal)  . GERD (gastroesophageal reflux disease)   . Hiatal hernia   . Unspecified gastritis and gastroduodenitis without mention of hemorrhage   . Stricture and stenosis of esophagus   . Arthritis   . Blood transfusion without reported diagnosis   . Ulcer   . Mesenteric artery stenosis     95% Celiac Artery - ostial, 20-30% SMA.  Bilateral Renal A: L RA stent patent, R RA 20-30% -- Conservative Management   Interval History:  As is her usual set of complaints, she still notes intermittent chest pain - wiith & without associate with exertion, DOE (with moderate exertion), but mostly notes R flank/abdominal pain as her major complaint. No PND/Orthopnea with trace edema (better with leg elevation).  Still with mildly symptomatic varicose veins in BLE. Palpitations are intermittent, but stable to improved on BB. No lightheadedness, dizziness, weakness or syncope/near syncope. No TIA/amaurosis fugax symptoms. No melena, hematochezia, hematuria, or epstaxis. No claudication.  ROS: A comprehensive was performed. Review of Systems  Constitutional: Negative for fever, chills and weight loss.  HENT: Negative for hearing loss and nosebleeds.   Respiratory: Positive for shortness of breath.  Negative for cough and hemoptysis.   Cardiovascular: Negative for claudication.  Gastrointestinal: Positive for nausea and abdominal pain. Negative for constipation, blood in stool and melena.  Genitourinary: Positive for flank pain. Negative for dysuria and hematuria.  Musculoskeletal: Positive for back pain and joint pain. Negative for falls.       Intermittent cramps.  Neurological: Positive for dizziness. Negative for focal weakness, seizures, loss of consciousness and headaches.       -- postural  All other systems reviewed and are negative.  Current Outpatient Prescriptions on File Prior to Visit  Medication Sig Dispense Refill  . aspirin 325 MG tablet Take 325 mg by mouth daily.      . carvedilol (COREG) 6.25 MG tablet Take 6.25 mg by mouth 2 (two) times daily with a meal.      . clopidogrel (PLAVIX) 75 MG tablet Take 75 mg by mouth daily.      . furosemide (LASIX) 40 MG tablet Take 40-80 mg by mouth 2 (two) times daily. *takes 80mg  in the morning and 40mg  in the afternoon*      . gabapentin (NEURONTIN) 600 MG tablet Take 1,200 mg by mouth 3 (three) times daily.      Marland Kitchen lidocaine (XYLOCAINE) 5 % ointment Apply 1 application topically daily.      Marland Kitchen losartan (COZAAR) 100 MG tablet Take 100 mg by mouth daily.      . metolazone (ZAROXOLYN) 2.5 MG tablet Take 2.5 mg by mouth every other day.      . Pancrelipase, Lip-Prot-Amyl, (ZENPEP) 20000 UNITS CPEP Take 40,000 Units by mouth 3 (three) times daily.      . pantoprazole (PROTONIX) 40 MG tablet Take 40 mg by mouth daily.       No current facility-administered medications on file prior to visit.    ALLERGIES REVIEWED IN EPIC -- No change SOCIAL AND FAMILY HISTORY REVIEWED IN EPIC -- No change  Wt Readings from Last 3 Encounters:  06/06/14 215 lb 8 oz (97.75 kg)  05/12/14 211 lb (95.709 kg)  05/12/14 211 lb (95.709 kg)    PHYSICAL EXAM BP 128/72  Pulse 81  Ht 5\' 8"  (1.727 m)  Wt 215 lb 8 oz (97.75 kg)  BMI 32.77 kg/m2 General  appearance: alert, cooperative, appears stated age, no distress and mildly obese  HEENT: [place]/AT, EOMI, MMM, anicteric sclera  Neck: no adenopathy, soft R carotid bruit, no JVD and supple, symmetrical, trachea midline  Lungs: clear to auscultation bilaterally, normal percussion bilaterally and Nonlabored, good air movement  Heart: RRR, normal S1 and S2 with an S4 gallop. 1/6 SEM at RUSB --> carotids. Nondisplaced PMI. No rubs  Abdomen: soft, non-tender; bowel sounds normal; no masses, no organomegaly  Extremities: no clubbing or cyanosis but with 1+ bilateral edema with varicosities.  Pulses: 2+ and symmetric  Neurologic: Mental status: Alert, oriented, thought content appropriate  Cranial nerves: normal; normal strength    Adult ECG Report  Rate: 81 ;  Rhythm: normal sinus rhythm, Non-specific ST-T wave changes  Narrative Interpretation: stable EKG  Recent Labs:  No pertinenet labs available   ASSESSMENT / PLAN: CAD S/P percutaneous coronary angioplasty: pRCA BMS, mLAD BMS overlapped prox with DES for ISR Stents looked patent with no obvious disease by recent cardiac cath - to explain her Sx which are quite atypical.   Continue DAPT, BB & ARB.   ? Consider nitrate vs. Ranexa for ? Microvascular disease.  Congestive heart failure with LV diastolic dysfunction, NYHA class 2 Stable - LE edema with Sliding scale.  No CHF symptoms, but some exertional "angina" chest pain could be microvascular disease with diastolic dysfunction. Did not tolerate increased BB dose.  BP actually looks better today.  Celiac artery stenosis: 60% by Duplex - 90-95% by cath - Med Rx Referred to Dr. Gwenlyn Found @ request of Dr. Carlean Purl (GI) -- per d/w both Dr. Gwenlyn Found & Dr. Trula Slade, med Rx is best management for Celiac disease which is not usually associated with post-prandial abdominal pain (esp. With patent SMA & IMA)  Labile essential hypertension Relatively stable - but does have some positional dizziness -- Take  losartan  In the middle of the day. If blood pressure is less than  120 (top number),hold losartan that day.  Dyslipidemia, goal LDL below 70 - WITH STATIN INTOLERANCE Discussed referring to Pharm D - to discuss possible PCSK-9 Inhibitor options -- ? SPIRE TRIAL.  Left renal artery stenosis - s/p PTA-Stent 6.0 mm x 15 mm; dopplers show patent LRA. Patent by cath.  Obesity (BMI 30.0-34.9) Weight seems to be going the wrong direction.  Needs to pick up exercise level & work on dietary modification.    Orders Placed This Encounter  Procedures  . EKG 12-Lead   Meds ordered this encounter  Medications  . nitroGLYCERIN (NITROSTAT) 0.4 MG SL tablet    Sig: Place 1 tablet (0.4 mg total) under the tongue every 5 (five) minutes as needed for chest pain.    Dispense:  25 tablet    Refill:  6     Followup: 6 months   Eurydice Calixto W, M.D., M.S. Interventional Cardiologist   Pager # [phone]

## 2014-06-10 NOTE — Assessment & Plan Note (Signed)
Relatively stable - but does have some positional dizziness -- Take losartan  In the middle of the day. If blood pressure is less than  120 (top number),hold losartan that day.

## 2014-06-17 ENCOUNTER — Other Ambulatory Visit: Payer: Self-pay

## 2014-07-14 DIAGNOSIS — M545 Low back pain: Secondary | ICD-10-CM | POA: Diagnosis not present

## 2014-07-14 DIAGNOSIS — M961 Postlaminectomy syndrome, not elsewhere classified: Secondary | ICD-10-CM | POA: Diagnosis not present

## 2014-07-14 DIAGNOSIS — M461 Sacroiliitis, not elsewhere classified: Secondary | ICD-10-CM | POA: Diagnosis not present

## 2014-07-26 DIAGNOSIS — M545 Low back pain: Secondary | ICD-10-CM | POA: Diagnosis not present

## 2014-07-26 DIAGNOSIS — M461 Sacroiliitis, not elsewhere classified: Secondary | ICD-10-CM | POA: Diagnosis not present

## 2014-07-26 DIAGNOSIS — Z6835 Body mass index (BMI) 35.0-35.9, adult: Secondary | ICD-10-CM | POA: Diagnosis not present

## 2014-07-26 DIAGNOSIS — I10 Essential (primary) hypertension: Secondary | ICD-10-CM | POA: Diagnosis not present

## 2014-07-26 DIAGNOSIS — M961 Postlaminectomy syndrome, not elsewhere classified: Secondary | ICD-10-CM | POA: Diagnosis not present

## 2014-08-11 ENCOUNTER — Encounter (HOSPITAL_COMMUNITY): Payer: Self-pay | Admitting: Cardiology

## 2014-09-01 DIAGNOSIS — M2040 Other hammer toe(s) (acquired), unspecified foot: Secondary | ICD-10-CM | POA: Diagnosis not present

## 2014-09-01 DIAGNOSIS — B351 Tinea unguium: Secondary | ICD-10-CM | POA: Diagnosis not present

## 2014-09-01 DIAGNOSIS — M2010 Hallux valgus (acquired), unspecified foot: Secondary | ICD-10-CM | POA: Diagnosis not present

## 2014-09-01 DIAGNOSIS — L84 Corns and callosities: Secondary | ICD-10-CM | POA: Diagnosis not present

## 2014-09-01 DIAGNOSIS — E1142 Type 2 diabetes mellitus with diabetic polyneuropathy: Secondary | ICD-10-CM | POA: Diagnosis not present

## 2014-09-06 DIAGNOSIS — L820 Inflamed seborrheic keratosis: Secondary | ICD-10-CM | POA: Diagnosis not present

## 2014-09-27 ENCOUNTER — Other Ambulatory Visit: Payer: Self-pay | Admitting: Cardiology

## 2014-09-27 MED ORDER — CLOPIDOGREL BISULFATE 75 MG PO TABS: 75.0000 mg | ORAL_TABLET | Freq: Every day | ORAL | Status: DC

## 2014-09-27 NOTE — Telephone Encounter (Signed)
PATIENT AWARE  E-SENT #30 DAY SUPPLY PER PATIENT REQUEST.

## 2014-09-27 NOTE — Telephone Encounter (Signed)
[name] is needing a new prescription from Dr. Ellyn Hack for her Palvix 75mg  sent to her pharmacy [facility] in [place].   Thanks

## 2014-10-24 ENCOUNTER — Ambulatory Visit (INDEPENDENT_AMBULATORY_CARE_PROVIDER_SITE_OTHER): Payer: Medicare Other | Admitting: Gastroenterology

## 2014-10-24 ENCOUNTER — Encounter: Payer: Self-pay | Admitting: Gastroenterology

## 2014-10-24 ENCOUNTER — Other Ambulatory Visit (INDEPENDENT_AMBULATORY_CARE_PROVIDER_SITE_OTHER): Payer: Medicare Other

## 2014-10-24 VITALS — BP 146/74 | HR 80 | Ht 68.0 in | Wt 223.0 lb

## 2014-10-24 DIAGNOSIS — R1013 Epigastric pain: Secondary | ICD-10-CM

## 2014-10-24 DIAGNOSIS — R197 Diarrhea, unspecified: Secondary | ICD-10-CM

## 2014-10-24 LAB — CBC WITH DIFFERENTIAL/PLATELET
Basophils Absolute: 0.1 10*3/uL (ref 0.0–0.1)
Basophils Relative: 0.5 % (ref 0.0–3.0)
EOS ABS: 0.1 10*3/uL (ref 0.0–0.7)
EOS PCT: 1.1 % (ref 0.0–5.0)
HCT: 38 % (ref 36.0–46.0)
Hemoglobin: 13 g/dL (ref 12.0–15.0)
Lymphocytes Relative: 20.8 % (ref 12.0–46.0)
Lymphs Abs: 2.2 10*3/uL (ref 0.7–4.0)
MCHC: 34.2 g/dL (ref 30.0–36.0)
MCV: 82.3 fl (ref 78.0–100.0)
MONO ABS: 1.2 10*3/uL — AB (ref 0.1–1.0)
Monocytes Relative: 11.9 % (ref 3.0–12.0)
NEUTROS ABS: 6.8 10*3/uL (ref 1.4–7.7)
Neutrophils Relative %: 65.7 % (ref 43.0–77.0)
Platelets: 259 10*3/uL (ref 150.0–400.0)
RBC: 4.61 Mil/uL (ref 3.87–5.11)
RDW: 14 % (ref 11.5–15.5)
WBC: 10.4 10*3/uL (ref 4.0–10.5)

## 2014-10-24 LAB — TSH: TSH: 2.4 u[IU]/mL (ref 0.35–4.50)

## 2014-10-24 MED ORDER — PANTOPRAZOLE SODIUM 40 MG PO TBEC: 40.0000 mg | DELAYED_RELEASE_TABLET | Freq: Every day | ORAL | Status: DC

## 2014-10-24 MED ORDER — KETOROLAC TROMETHAMINE 10 MG PO TABS: 10.0000 mg | ORAL_TABLET | Freq: Four times a day (QID) | ORAL | Status: DC | PRN

## 2014-10-24 MED ORDER — ONDANSETRON 4 MG PO TBDP: 4.0000 mg | ORAL_TABLET | Freq: Four times a day (QID) | ORAL | Status: DC | PRN

## 2014-10-24 NOTE — Patient Instructions (Signed)
Your physician has requested that you go to the basement for lab work before leaving today.  We have sent the following medications to your pharmacy for you to pick up at your convenience:Toradol and Zofran.

## 2014-10-25 ENCOUNTER — Encounter: Payer: Self-pay | Admitting: *Deleted

## 2014-10-25 ENCOUNTER — Other Ambulatory Visit: Payer: Self-pay | Admitting: *Deleted

## 2014-10-25 DIAGNOSIS — R109 Unspecified abdominal pain: Secondary | ICD-10-CM

## 2014-10-25 LAB — COMPREHENSIVE METABOLIC PANEL
ALK PHOS: 82 U/L (ref 39–117)
ALT: 12 U/L (ref 0–35)
AST: 14 U/L (ref 0–37)
Albumin: 4 g/dL (ref 3.5–5.2)
BILIRUBIN TOTAL: 0.5 mg/dL (ref 0.2–1.2)
BUN: 13 mg/dL (ref 6–23)
CO2: 33 mEq/L — ABNORMAL HIGH (ref 19–32)
CREATININE: 0.78 mg/dL (ref 0.40–1.20)
Calcium: 9.6 mg/dL (ref 8.4–10.5)
Chloride: 94 mEq/L — ABNORMAL LOW (ref 96–112)
GFR: 77.04 mL/min (ref >60.00)
GLUCOSE: 118 mg/dL — AB (ref 70–99)
Potassium: 3.1 mEq/L — ABNORMAL LOW (ref 3.5–5.1)
Sodium: 138 mEq/L (ref 135–145)
Total Protein: 7.8 g/dL (ref 6.0–8.3)

## 2014-10-25 LAB — TISSUE TRANSGLUTAMINASE, IGA: TISSUE TRANSGLUTAMINASE AB, IGA: 1 U/mL (ref <4)

## 2014-10-25 LAB — AMYLASE: AMYLASE: 17 U/L — AB (ref 27–131)

## 2014-10-25 LAB — LIPASE: Lipase: 20 U/L (ref 11.0–59.0)

## 2014-10-25 LAB — IGA: IGA: 464 mg/dL — AB (ref 68–378)

## 2014-10-25 MED ORDER — DIPHENHYDRAMINE HCL 25 MG PO TABS: ORAL_TABLET | ORAL | Status: DC

## 2014-10-25 MED ORDER — PREDNISONE 50 MG PO TABS: ORAL_TABLET | ORAL | Status: DC

## 2014-10-25 NOTE — Progress Notes (Signed)
10/25/2014 [name] [id number] [birth year]   History of Present Illness:  This is a 74 year old female who is now known to Dr. Carlean Purl.  She has multiple medical problems including coronary artery disease with stent placement requiring antiplatelet therapy, renal artery stenosis with renal artery stent in place, hypertension, hyperlipidemia. She also has a complicated GI history as well. She has a pancreatic divisum, which caused her multiple issues in the past and she had to have a minor papilla stent placed previously. She's been maintained on pancreatic enzyme therapy for those issues. She underwent a cholecystectomy in the past for issues with gallstones. Has biliary dilitation on imaging studies.  She's a history of reflux with hiatal hernia and previous stricture of the esophagus. She's not currently on PPI therapy, although it was recommended at her last EGD that she be taking pantoprazole 40 mg daily. Last EGD was in July 2015 at which time she was found to have non-erosive gastritis, but was otherwise normal; esophagus was empirically dilated with [place] due to complaints of dysphagia. Gastric biopsies at that time showed mild reactive gastropathy with no Hpylori.  Her last colonoscopy was in June 2010 at which time she had severe diverticulosis in the sigmoid colon and hypertrophied anal papillae, but was otherwise normal. Random biopsies throughout the colon at that time were benign.  She has multiple abdominal scars/surgeries including history of adhesions, which have been lysed.    She presents to our office again today with complaints of upper abdominal pain.  This is the same pain that she has struggled with on and off for several years, but has bene exponentially worse over the past 5-6 weeks.  She reports that the pain has been present to some degree as discomfort since she was last seen here in July/August 2015, but has definitely worsened again recently.  She is here  today with her daughter who expresses how much pain her mother has been in recently.  Pain is not necessarily worsened by eating and she is not losing weight; weight is actually increased by 3 pounds since July 2015.  She feels like her abdomen gets "swollen" or bloated.  Also complains of frequent diarrhea.  Although the diarrhea is an issue for her at times, she says that she can deal with that, but the pain has caused her to not be able to go places and live her normal life.  She has celiac and SMA stenosis of approximately 60% and has seen vascular surgery, but the recommendations were to manage this medically for now.   Current Medications, Allergies, Past Medical History, Past Surgical History, Family History and Social History were reviewed in [employer] record.   Physical Exam: BP 146/74 mmHg  Pulse 80  Ht 5\' 8"  (1.727 m)  Wt 223 lb (101.152 kg)  BMI 33.91 kg/m2 General: Well developed, white female in no acute distress Head: Normocephalic and atraumatic Eyes:  Sclerae anicteric, conjunctiva pink  Ears: Normal auditory acuity Lungs: Clear throughout to auscultation Heart: Regular rate and rhythm Abdomen: Soft, non-distended.  Normal bowel sounds.  Mild RUQ and epigastric TTP without R/R/G.  Multiple large scars noted on abdomen. Musculoskeletal: Symmetrical with no gross deformities  Extremities: No edema  Neurological: Alert oriented x 4, grossly non-focal Psychological:  Alert and cooperative. Normal mood and affect  Assessment and Recommendations: -Epigastric/upper abdominal pain:  Worsened over the past 5-6 weeks, but has experienced intermittent recurrent similar pains in the  past for SEVERAL years.  Has pancreatic divisum requiring stent placement in the past and chronic pancreatic enzyme therapy and has some biliary dilitation on imaging studies.  ? Pancreatic source of pain.  Also, celiac and SMA stenosis as seen on imaging; has seen vascular  surgery, but recommendations were to manage this medically.  ? If this is causing chronic mesenteric ischemia.  She has multiple abdominal scars/surgeries including history of adhesions, which have been lysed.  Will check a CBC, CMP, and lipase/amylase.  Discussed with Dr. Carlean Purl, will order repeat CT scan of the abdomen with contrast since her last imaging was 04/2014.  Will give her some toradol to take for a couple of days prn (cannot take tramadol or anything with codeine, and tylenol has not been helping with the pain).  She is also asking for some zofran to use with the toradol in case it makes her nauseated.  Will start pantoprazole 40 mg daily as well since this was recommended previously, but never started. -Diarrhea:  Unsure if this is related to the cause of pain or if it is a separate issue.  Could be bile salt diarrhea vs IBS.  Will check TSH and celiac labs.  We discussed possible need for colonoscopy as well.

## 2014-10-28 NOTE — Progress Notes (Signed)
Agree with APP assessment and plan.

## 2014-11-01 ENCOUNTER — Inpatient Hospital Stay: Admission: RE | Admit: 2014-11-01 | Payer: Medicare Other | Source: Ambulatory Visit

## 2014-11-01 ENCOUNTER — Telehealth: Payer: Self-pay | Admitting: *Deleted

## 2014-11-01 NOTE — Telephone Encounter (Signed)
Received a call from [facility] that patient did not take pre medications. States she did not know about them. (patient was notified and given written instructions) CT will be rescheduled. Patient states she is going out of town so it may be later next week.

## 2014-11-08 ENCOUNTER — Ambulatory Visit (INDEPENDENT_AMBULATORY_CARE_PROVIDER_SITE_OTHER)
Admission: RE | Admit: 2014-11-08 | Discharge: 2014-11-08 | Disposition: A | Discharge status: Home / Self Care | Payer: Medicare Other | Source: Ambulatory Visit | Attending: Gastroenterology | Admitting: Gastroenterology

## 2014-11-08 DIAGNOSIS — R109 Unspecified abdominal pain: Secondary | ICD-10-CM | POA: Diagnosis not present

## 2014-11-08 DIAGNOSIS — D3502 Benign neoplasm of left adrenal gland: Secondary | ICD-10-CM | POA: Diagnosis not present

## 2014-11-08 DIAGNOSIS — K838 Other specified diseases of biliary tract: Secondary | ICD-10-CM | POA: Diagnosis not present

## 2014-11-08 DIAGNOSIS — N200 Calculus of kidney: Secondary | ICD-10-CM | POA: Diagnosis not present

## 2014-11-08 MED ORDER — IOHEXOL 300 MG/ML  SOLN
80.0000 mL | Freq: Once | INTRAMUSCULAR | Status: AC | PRN
  Administered 2014-11-08: 80 mL via INTRAVENOUS

## 2014-11-09 ENCOUNTER — Encounter: Payer: Self-pay | Admitting: Internal Medicine

## 2014-11-09 ENCOUNTER — Ambulatory Visit (INDEPENDENT_AMBULATORY_CARE_PROVIDER_SITE_OTHER): Payer: Medicare Other | Admitting: Internal Medicine

## 2014-11-09 VITALS — BP 144/72 | HR 92 | Ht 65.0 in | Wt 225.0 lb

## 2014-11-09 DIAGNOSIS — K589 Irritable bowel syndrome without diarrhea: Secondary | ICD-10-CM

## 2014-11-09 MED ORDER — DULOXETINE HCL 30 MG PO CPEP: ORAL_CAPSULE | ORAL | Status: DC

## 2014-11-09 NOTE — Assessment & Plan Note (Signed)
dulox

## 2014-11-09 NOTE — Patient Instructions (Addendum)
Take your Pancrelipase with meals.  We have sent the following medications to your pharmacy for you to pick up at your convenience: Generic Cymbalta,  The first two weeks take one tablet with supper , then go to 2 tablets daily with supper.   Follow up with [place] in two months. Your appointment is 01/03/15 at 1:45 pm.    I appreciate the opportunity to care for you.

## 2014-11-09 NOTE — Progress Notes (Signed)
Subjective:    Patient ID: [name], female    DOB: 09/19/1940, 74 y.o.   MRN: [id number] Chief complaint: Abdominal pain, epigastric HPI  Patient presents for follow-up. She continues to have chronic recurrent severe epigastric pain. Often after eating. He is not losing weight. CT abdomen and pelvis did not demonstrate any new findings. Because of an iodine contact allergies she took prednisone. She has been itchy without rash after CT.  Allergies  Allergen Reactions  . Codeine Phosphate Swelling  . Statins     Intolerant   . Tramadol     Kept patient awake  . Iodine Swelling and Rash    REACTION: unspecified This allergy is to Topical iodine only.   . Prednisone Itching and Rash   Outpatient Prescriptions Prior to Visit  Medication Sig Dispense Refill  . aspirin 325 MG tablet Take 325 mg by mouth daily.    . clopidogrel (PLAVIX) 75 MG tablet Take 1 tablet (75 mg total) by mouth daily. 30 tablet 6  . furosemide (LASIX) 40 MG tablet Take 40-80 mg by mouth 2 (two) times daily. *takes 80mg  in the morning and 40mg  in the afternoon*    . gabapentin (NEURONTIN) 600 MG tablet Take 1,200 mg by mouth 3 (three) times daily.    Marland Kitchen ketorolac (TORADOL) 10 MG tablet Take 1 tablet (10 mg total) by mouth every 6 (six) hours as needed. 10 tablet 0  . lidocaine (XYLOCAINE) 5 % ointment Apply 1 application topically daily.    Marland Kitchen losartan (COZAAR) 100 MG tablet Take 100 mg by mouth daily.    . metolazone (ZAROXOLYN) 2.5 MG tablet Take 2.5 mg by mouth every other day.    . nitroGLYCERIN (NITROSTAT) 0.4 MG SL tablet Place 1 tablet (0.4 mg total) under the tongue every 5 (five) minutes as needed for chest pain. 25 tablet 6  . ondansetron (ZOFRAN ODT) 4 MG disintegrating tablet Take 1 tablet (4 mg total) by mouth every 6 (six) hours as needed for nausea or vomiting. 20 tablet 0  . Pancrelipase, Lip-Prot-Amyl, (ZENPEP) 20000 UNITS CPEP Take 40,000 Units by mouth 3 (three) times daily.    .  pantoprazole (PROTONIX) 40 MG tablet Take 1 tablet (40 mg total) by mouth daily. 30 tablet 11  . diphenhydrAMINE (BENADRYL) 25 MG tablet Take 50 mg po on 11/01/14 at 12:30 PM 2 tablet 0  . predniSONE (DELTASONE) 50 MG tablet Take one tablet on 11/01/14 at 12:30 AM. Take one tablet on 11/01/14 at 6:30 AM and take another tablets at 12:30 PM. 3 tablet 0   No facility-administered medications prior to visit.   Past Medical History  Diagnosis Date  . CAD S/P percutaneous coronary angioplasty     a) LHC: 07/30/10. -- 3.0x85mm Integrity BMS to pRCA & 2.5x 10mm Integrity BMS mLAD(@ D2).  b) Class III-IV Angina 03/2011: LHC- ISR in LAD BMS -- prox overlapping Promus DES 2.5x32mm and PTCA of jailed D2 ostium-prox 80%. c) 02/03/12:  LHC- patent stents.  Jailed diagonal. with stable flow; d) Peri-OP NSTEMI 04/2012 - LHC in 12/'13 -   . Congestive heart failure with LV diastolic dysfunction, NYHA class 2     06/13/10:  last 2D echo-  EF >55%, Mild TR, Mod Conc LVH - Grade 1 diastolic dysfunction (abnormal relaxation) --> LVEDP on Cath 28 mmHg & mild 2nd Pulm HTN  . Renal artery stenosis 2011; 12/'13    a) Angiogram 02/03/12:  50-60%L RA stenosis, 40% R R Inferior artery;  b) 12/'13: S/P L RA Stent ([facility]) 6.0 mm x 15 mm; c) Renal Duplex 10/2013: <60% L RA, <60 R RA, ~60% SMA & Celiac A.  . Labile essential hypertension     Partially related to RAS  . Dyslipidemia, goal LDL below 70     Intolerant to statins  . NSTEMI (non-ST elevated myocardial infarction) 04/2012    Unclear the details, apparently this was postoperative from her back surgery that she was cleared for my last saw her in June.  Reportedly had stents placed  . Bilateral edema of lower extremity     Chronic, likely related to venous stasis  . Irritable bowel syndrome (IBS)   . Diverticulitis of colon (without mention of hemorrhage)   . Hepatomegaly   . Pancreas divisum     On pancreatic enzyme  . Calculus of kidney   . Unspecified disorder of  thyroid   . Back pain with radiation     Chronic Back Pain - mutliple surgeries (including tumor removal)  . GERD (gastroesophageal reflux disease)   . Hiatal hernia   . Unspecified gastritis and gastroduodenitis without mention of hemorrhage   . Stricture and stenosis of esophagus   . Arthritis   . Blood transfusion without reported diagnosis   . Ulcer   . Mesenteric artery stenosis     95% Celiac Artery - ostial, 20-30% SMA.  Bilateral Renal A: L RA stent patent, R RA 20-30% -- Conservative Management   Past Surgical History  Procedure Laterality Date  . Cholecystectomy    . Minor papilla stent      Dr Pershing Proud  . Knee surgery      left  . Foot surgery      right and left  . Hip surgery      left  . Breast ductal system excision      right  . Abdominal hysterectomy    . Hand surgery      left  . Spine surgery      tumor removed 07/2010; Redo Surgery 04/2012; Sacroiliac Sgx 08/2013  . Coronary angioplasty with stent placement  07/2010, 02/2011    LHC: 07/30/10.  3.0x61mm Integrity BMS to RCA and 2.5x 33mm Integrity BMS LAD.  03/2011: LHC- ISRS in LAD treated with overlapping Promus DES 2.5x54mm and PTCA of Diagonal.  02/03/12:  LHC- patent stents.  Jailed diagonal.  . Cardiac catheterization  03/2012    ~30% ISR RCA, patent LAD stent & D2; EDP ~28 mmHg  . Cardiovascular stress test      11/07/11:  Normal perfusion pattern.  EF 66% No wall motion abnormalities.   . Renal artery stent Left 08/2012    @ [facility] - 6.0 mm x 15 mm  . Cardiac catheterization  08/2012    Peri-Op MI @ [facility] -- 40% ostial D1, patent LAD stents, 10% RCA ISR  . Colonoscopy  03/01/2009    normal   . Esophagogastroduodenoscopy  04/08/2012  . Cataract extraction    . Left heart catheterization with coronary angiogram N/A 02/03/2012    Procedure: LEFT HEART CATHETERIZATION WITH CORONARY ANGIOGRAM;  Surgeon: Leonie Man, MD;  Location: [facility] CATH LAB;  Service: Cardiovascular;   Laterality: N/A;  . Visceral angiogram N/A 05/12/2014    Procedure: VISCERAL ANGIOGRAM;  Surgeon: Lorretta Harp, MD;  Location: [facility] CATH LAB;  Service: Cardiovascular;  Laterality: N/A;  . Left heart catheterization with coronary angiogram N/A 05/12/2014  Procedure: LEFT HEART CATHETERIZATION WITH CORONARY ANGIOGRAM;  Surgeon: Lorretta Harp, MD;  Location: [facility] CATH LAB;  Service: Cardiovascular;  Laterality: N/A;    Review of Systems As per history of present illness    Objective:   Physical Exam BP 144/72 mmHg  Pulse 92  Ht 5\' 5"  (1.651 m)  Wt 225 lb (102.059 kg)  BMI 37.44 kg/m2  Abdomen soft and nontender      Assessment & Plan:   1. Irritable bowel syndrome    I think this is her problem. She does have this celiac artery stenosis but the other main trunks of the visceral vessels are okay. She is not losing weight which goes against mesenteric ischemia as does the anatomy.  I have explained to her that at this point I don't think further testing is indicated. We need to try to alleviate the symptoms. I think duloxetine is a reasonable choice for this. I have explained the risks benefits and indications and side effects of this medication. We'll start with 30 mg daily for 2 weeks and then 60 mg daily thereafter and I will see her back in about 2 months.   15 minutes time spent with patient > half in counseling coordination of care  I appreciate the opportunity to care for this patient. CC: CABEZA,YURI, MD

## 2014-11-10 ENCOUNTER — Encounter: Payer: Self-pay | Admitting: Internal Medicine

## 2014-11-11 ENCOUNTER — Telehealth: Payer: Self-pay | Admitting: Internal Medicine

## 2014-11-11 NOTE — Telephone Encounter (Signed)
It is not that kind of medication - if she will listen please tell her that. Otherwise I don't have another recommendation. If she is not going to take it we do not need a follow-up appointment then.

## 2014-11-11 NOTE — Telephone Encounter (Signed)
Spoke with patient and she still is not going to try the cymbalta because her friends that took it had bad effects so she is scared. She said you guys talked about a second opinion, but she doesn't know who to see. Can it be someone in our practice or does it need to be outside of here Sir? I canceled her f/u appointment.

## 2014-11-21 NOTE — Telephone Encounter (Signed)
[name] Next available appt with me is fine (May 2016 is fine)

## 2014-11-21 NOTE — Telephone Encounter (Signed)
She is interested in a second opinion - hopefully within our group. She would like to be "cured" and prefers not to use duloxetine if she can avoid it.  I told her I would ask if Dr. Hilarie Fredrickson would be willing to see her (one time - not to assume care) for a second opinion. She has been to teritary centers in past and says "you are like a [place] pig there and never see same doctor twice"  Ulice Dash - would you please see her once and review for her/me?  I am also going to cc Annamarie Major since he has seen her about celiac stenosis in past. She is not losing weight but does c/o severe upper abdominal pain. ? If he should see her and consider any intervention - should we do a mesenteric doppler?

## 2014-11-21 NOTE — Telephone Encounter (Signed)
[name] for visit with me in clinic

## 2014-11-21 NOTE — Telephone Encounter (Signed)
Dr Hilarie Fredrickson- First available (01/16/15) or do you want me to work in sooner?

## 2014-11-22 NOTE — Telephone Encounter (Signed)
Patient has been scheduled for 1:45 pm on 01/16/15 with Dr Hilarie Fredrickson. She verbalizes understanding.

## 2014-12-05 DIAGNOSIS — H524 Presbyopia: Secondary | ICD-10-CM | POA: Diagnosis not present

## 2014-12-05 DIAGNOSIS — H04123 Dry eye syndrome of bilateral lacrimal glands: Secondary | ICD-10-CM | POA: Diagnosis not present

## 2014-12-05 DIAGNOSIS — E119 Type 2 diabetes mellitus without complications: Secondary | ICD-10-CM | POA: Diagnosis not present

## 2014-12-11 IMAGING — CR DG CHEST 2V
2 series · 2 of 2 positions shown · non-contrast
Comparison: [REDACTED] chest radiographs
04/15/2012. [HOSPITAL] abdomen CTA 04/05/2014.

CLINICAL DATA: 73-year-old female with lower extremity swelling,
peripheral artery disease, lower extremity pain. Preoperative study.

EXAM:
CHEST  2 VIEW

[w chest pa]
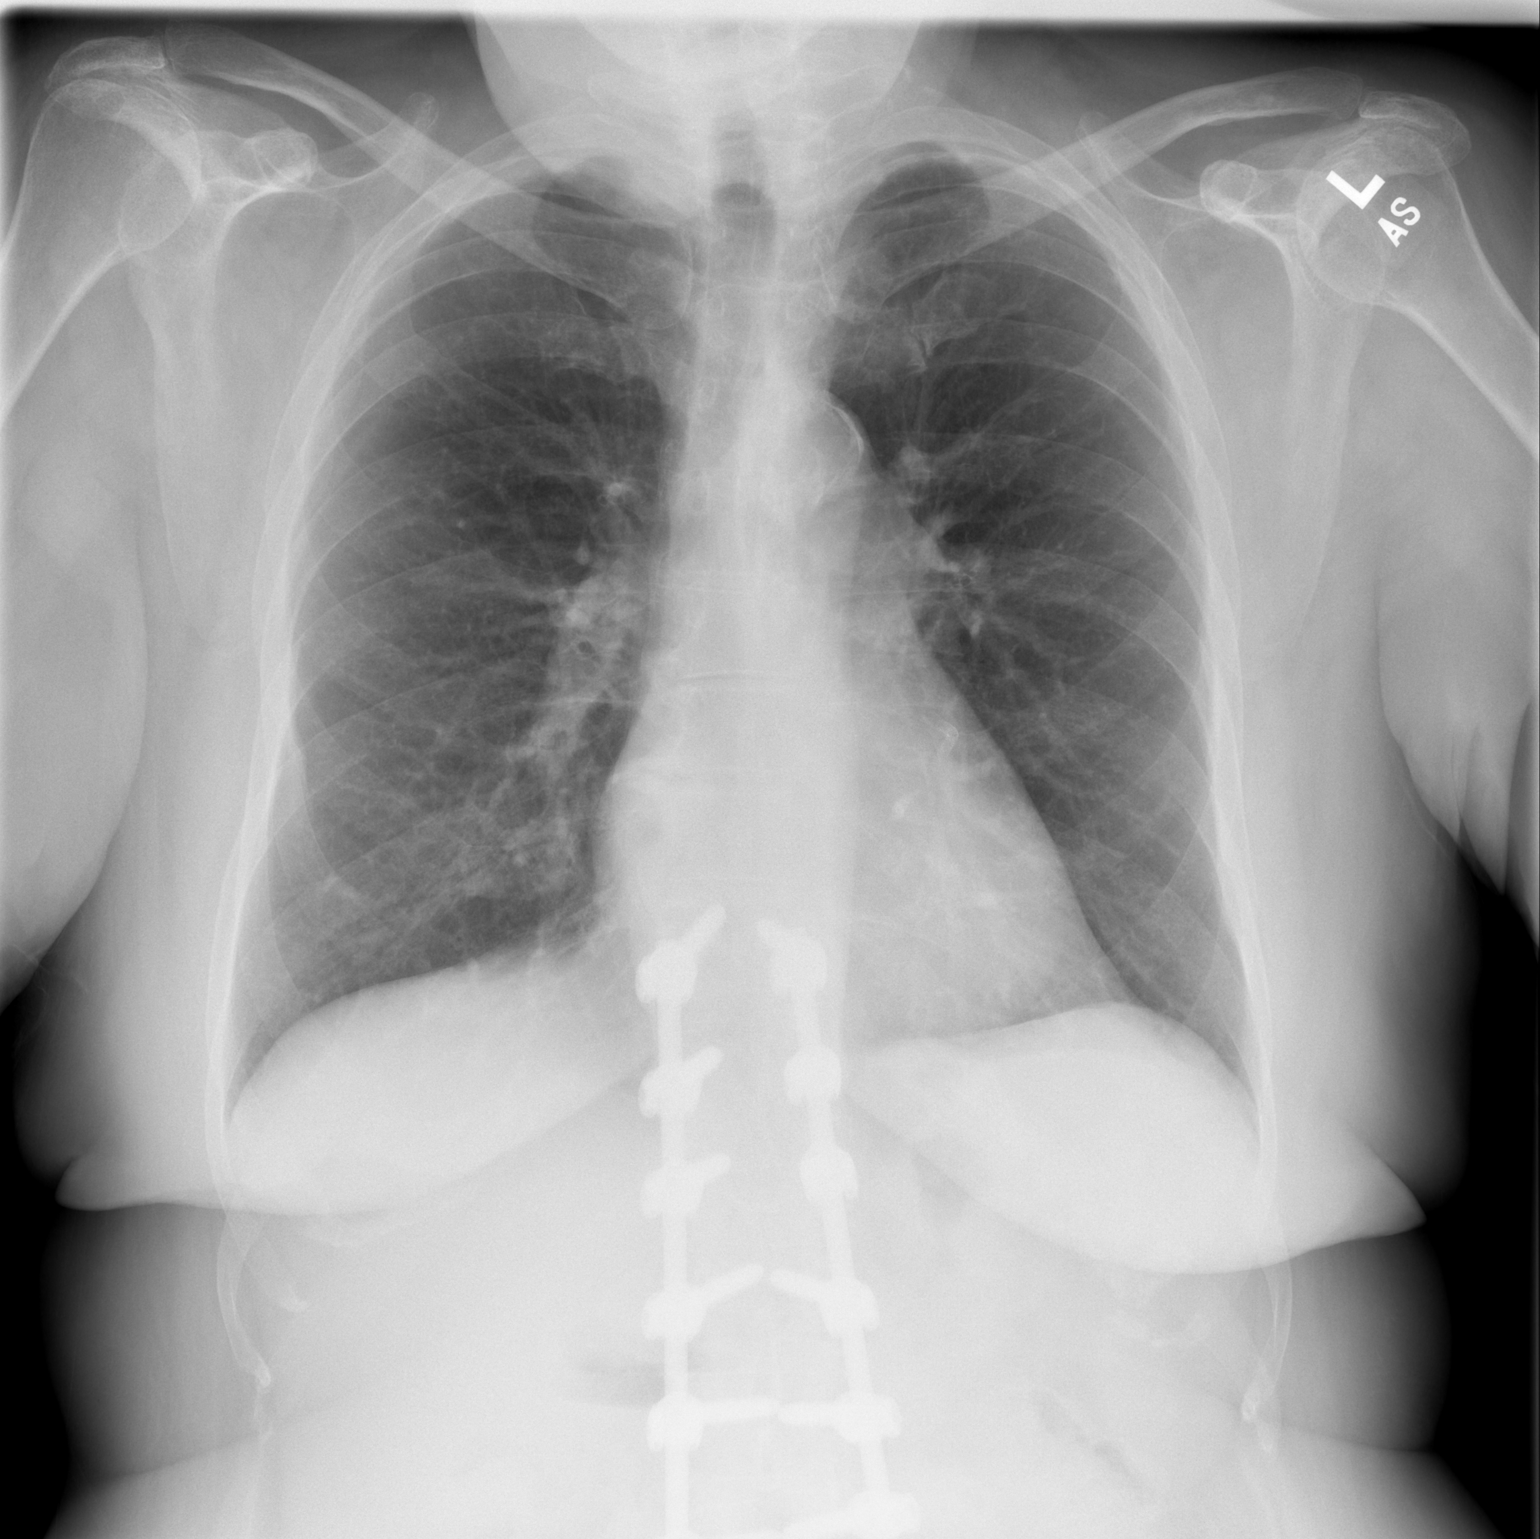

[w chest lat]
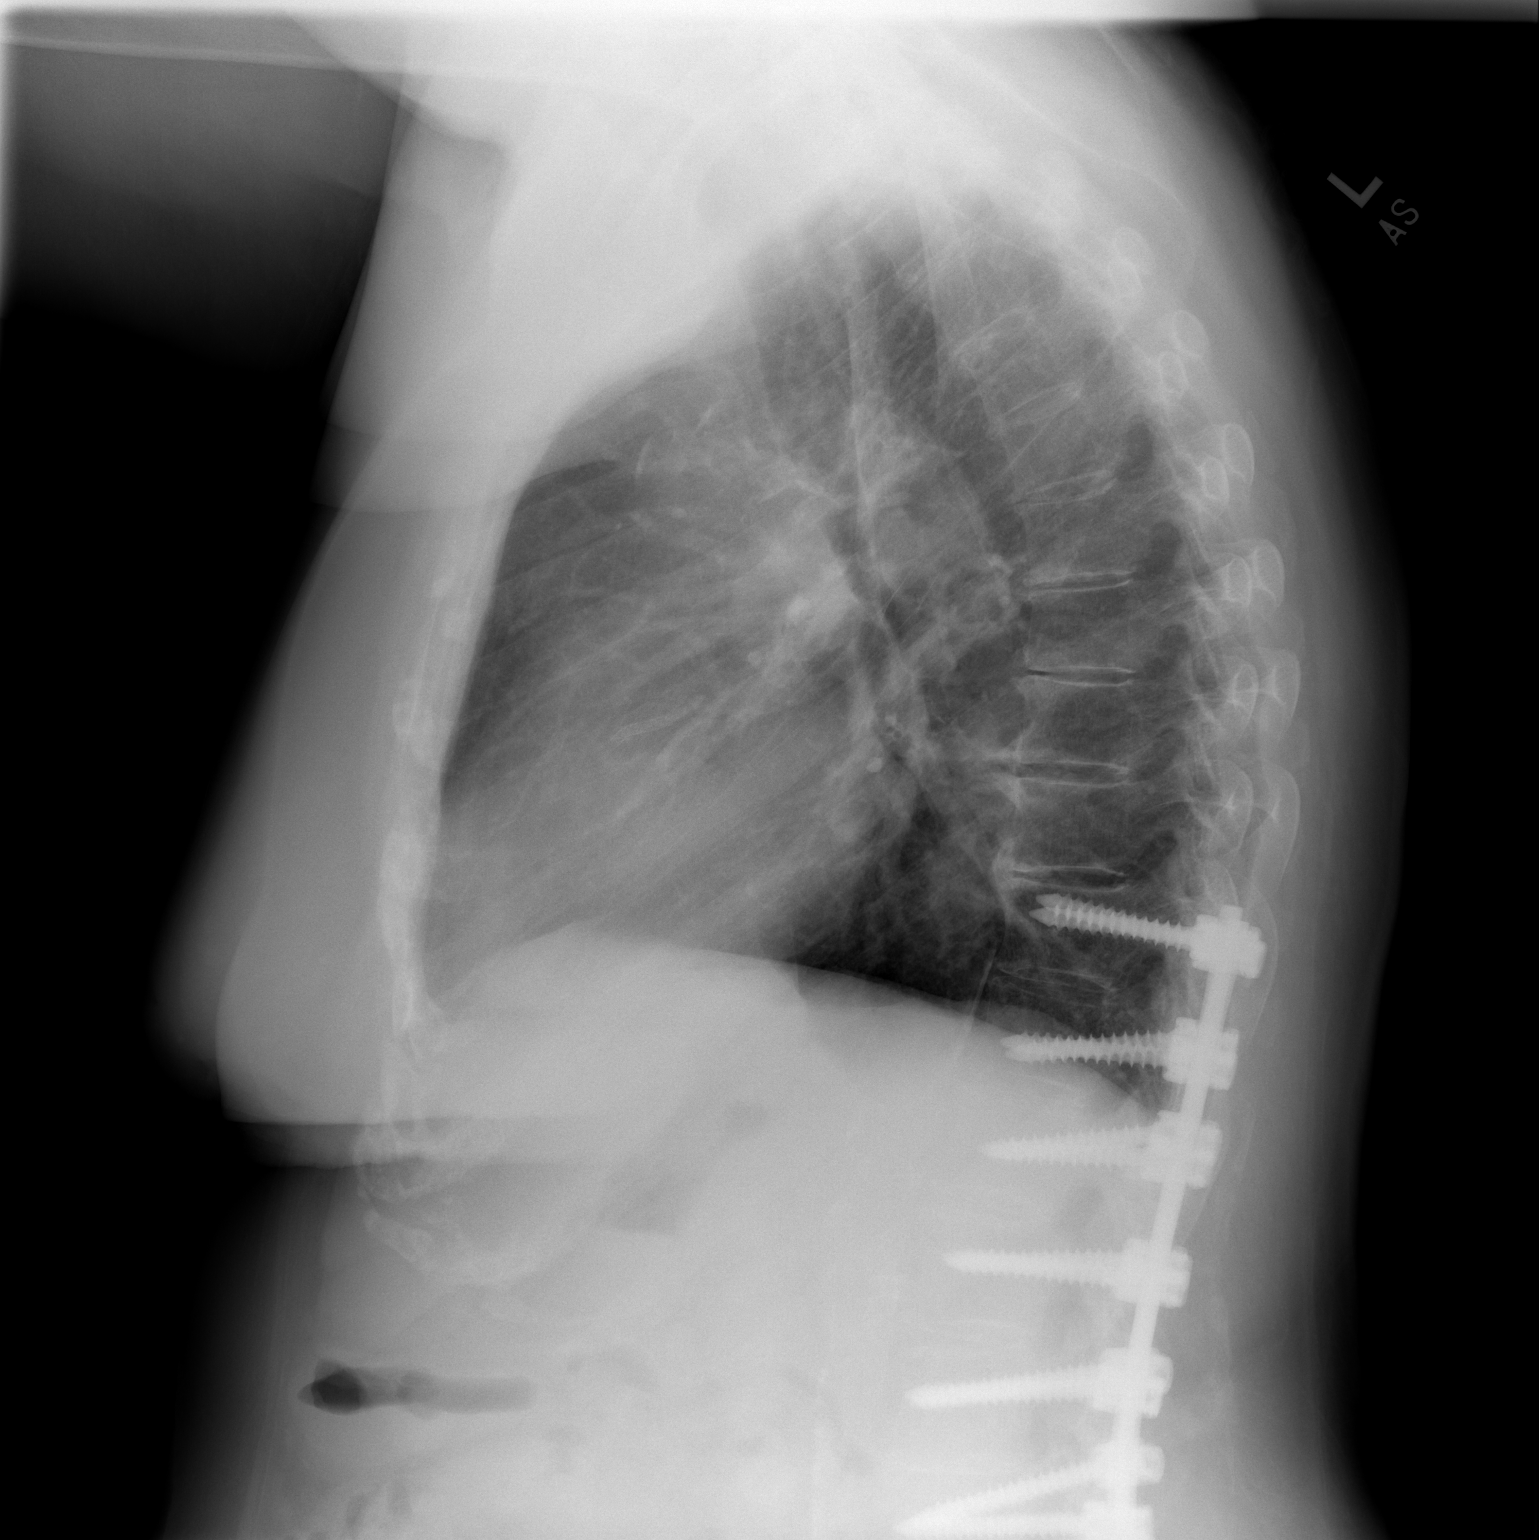

[2 of 2 positions shown; findings below may reference images not displayed]

FINDINGS: Stable lung volumes. Chronic lower thoracic and lumbar
transpedicular spine fusion. Normal cardiac size and mediastinal
contours. Visualized tracheal air column is within normal limits. No
pneumothorax, pulmonary edema, pleural effusion or confluent
pulmonary opacity. Calcified atherosclerosis of the aorta.
IMPRESSION: No acute cardiopulmonary abnormality.

## 2014-12-17 ENCOUNTER — Other Ambulatory Visit: Payer: Self-pay | Admitting: Gastroenterology

## 2015-01-03 ENCOUNTER — Ambulatory Visit: Payer: Medicare Other | Admitting: Internal Medicine

## 2015-01-04 DIAGNOSIS — M545 Low back pain: Secondary | ICD-10-CM | POA: Diagnosis not present

## 2015-01-04 DIAGNOSIS — M961 Postlaminectomy syndrome, not elsewhere classified: Secondary | ICD-10-CM | POA: Diagnosis not present

## 2015-01-16 ENCOUNTER — Encounter: Payer: Self-pay | Admitting: Internal Medicine

## 2015-01-16 ENCOUNTER — Ambulatory Visit (INDEPENDENT_AMBULATORY_CARE_PROVIDER_SITE_OTHER): Payer: Medicare Other | Admitting: Internal Medicine

## 2015-01-16 VITALS — BP 138/62 | HR 68 | Ht 65.0 in | Wt 217.0 lb

## 2015-01-16 DIAGNOSIS — Q453 Other congenital malformations of pancreas and pancreatic duct: Secondary | ICD-10-CM

## 2015-01-16 DIAGNOSIS — R1013 Epigastric pain: Secondary | ICD-10-CM

## 2015-01-16 DIAGNOSIS — K589 Irritable bowel syndrome without diarrhea: Secondary | ICD-10-CM | POA: Diagnosis not present

## 2015-01-16 DIAGNOSIS — G8929 Other chronic pain: Secondary | ICD-10-CM

## 2015-01-16 NOTE — Progress Notes (Signed)
Subjective:    Patient ID: [name], female    DOB: 1941-07-01, 74 y.o.   MRN: [id number]  HPI [name] is a 74 yo female with PMH of pancreatic divisum with remote pancreatitis (reports having a PD stent at some point placed by Dr. Filbert Schilder at [facility] years ago), peripheral vascular disease including celiac and renal artery stenosis (s/p renal artery stenting) on Plavix, gallstones status post cholecystectomy, hypertension, hyperlipidemia, nonerosive gastritis, history of abdominal adhesions and diverticulosis who is seen to provide a second opinion for episodic upper abdominal pain. She sees Dr. Carlean Purl and prior to this was seen by Dr. Sharlett Iles and Dr Velora Heckler.   She was last seen by Dr. Carlean Purl on 11/09/2014 and Cymbalta was recommended for IBS treatment and upper abdominal pain. This was not started as she has been waiting on the second opinion.  She reports episodic severe epigastric abdominal pain which radiates through to her back and into her right upper quadrant. This is not always related to eating but tends to start abruptly. Usually starts mid epigastrium radiates into the back and occasionally to the right upper quadrant. Infrequently associated with nausea, no vomiting. Can last for a few minutes to a few hours. Can be mild/moderate or severe. When most severe tends to last hours and she reports soreness in the upper abdomen the next day. Last severe episode approximately 15 days ago. No weight loss, no fear of eating. Bowel movements have been normal for her which is in general loose but rarely urgent. She does use Zenpep 2 caps with meals when she remembers. She was previously taking pantoprazole for her history of GERD but has not refilled this medicine when he last ran out. Heartburn is very rare and infrequent. Denies dysphagia or odynophagia today.  Review of records extensively performed today including but not limited to --  EGD, 03/29/2014 -- nonerosive gastritis biopsied.  Otherwise normal exam, 79 French Maloney dilation performed to treat dysphagia. Pathology = reactive gastropathy, mild. No H. Pylori. Colonoscopy 03/01/2009 -- severe sigmoid diverticulosis, hypertrophied anal papilla, otherwise normal exam exam to the cecum with good prep  CT abdomen and pelvis with contrast 11/08/2014 -- no acute abdominal findings, mass lesions or adenopathy. Stable CBD dilation likely due to prior cholecystectomy. Advanced atherosclerotic calcifications involving the aorta and branch vessels without focal aneurysm. Lower pole right renal calculus. Stable left adrenal adenoma. Extensive surgical changes involving the spine. (Pancreas no splenic (likely intended to be cystic) lesions or acute inflammation).  MRI abdomen with MRCP dated 03/22/2014 -- impression: Although the degree of biliary dilatation could reflect physiologic dilatation in the setting of prior cholecystectomy, there are several atypical features including a blunted appearance of the distal CBD and a questionable faint linear septation in the distal CBD near the ampulla. I do not observe this to be a double CBD, and the apparent septation is very subtle and could instead be due to small vascular structure along the blot termination of the CBD. I do not see an obvious filling defect other than this potential focal septation, although sensitivity is reduced by motion artifact. The possibility of a chronic low-grade stricture in the ampulla given a blunted appearance of the CBD is raised. 2. Pancreatic divisum, without dilatation of the dorsal pancreatic duct. 3. Small left adrenal adenoma  CT angiography abdomen and pelvis 04/05/2014 -- Celiac axis: There is high-grade narrowing at the origin of the celiac trunk due to a combination of calcified and noncalcified plaque. There are  two main branches of the left gastric artery. The splenic artery and common hepatic artery are patent.  Superior mesenteric: There is  calcified plaque along the superior aspect of the SMA causing mild narrowing at the origin of the SMA. The main SMA branches are patent.  Review of Systems As per history of present illness, otherwise negative  Current Medications, Allergies, Past Medical History, Past Surgical History, Family History and Social History were reviewed in [employer] record.     Objective:   Physical Exam BP 138/62 mmHg  Pulse 68  Ht 5\' 5"  (1.651 m)  Wt 217 lb (98.431 kg)  BMI 36.11 kg/m2 Constitutional: Well-developed and well-nourished. No distress. HEENT: Normocephalic and atraumatic. Oropharynx is clear and moist. No oropharyngeal exudate. Conjunctivae are normal.  No scleral icterus. Neck: Neck supple. Trachea midline. Cardiovascular: Normal rate, regular rhythm and intact distal pulses. No M/R/G Pulmonary/chest: Effort normal and breath sounds normal. No wheezing, rales or rhonchi. Abdominal: Soft, mild upper abdominal tenderness without rebound or guarding, nondistended. Bowel sounds active throughout. There are no masses palpable. No hepatosplenomegaly. Extremities: no clubbing, cyanosis, or edema Lymphadenopathy: No cervical adenopathy noted. Neurological: Alert and oriented to person place and time. Skin: Skin is warm and dry. No rashes noted. Psychiatric: Normal mood and affect. Behavior is normal.  CBC    Component Value Date/Time   WBC 10.4 10/24/2014 1621   RBC 4.61 10/24/2014 1621   HGB 13.0 10/24/2014 1621   HCT 38.0 10/24/2014 1621   PLT 259.0 10/24/2014 1621   MCV 82.3 10/24/2014 1621   MCH 28.2 05/06/2014 1022   MCHC 34.2 10/24/2014 1621   RDW 14.0 10/24/2014 1621   LYMPHSABS 2.2 10/24/2014 1621   MONOABS 1.2* 10/24/2014 1621   EOSABS 0.1 10/24/2014 1621   BASOSABS 0.1 10/24/2014 1621    CMP     Component Value Date/Time   NA 138 10/24/2014 1621   K 3.1* 10/24/2014 1621   CL 94* 10/24/2014 1621   CO2 33* 10/24/2014 1621   GLUCOSE 118*  10/24/2014 1621   BUN 13 10/24/2014 1621   CREATININE 0.78 10/24/2014 1621   CREATININE 0.72 05/06/2014 1022   CALCIUM 9.6 10/24/2014 1621   PROT 7.8 10/24/2014 1621   ALBUMIN 4.0 10/24/2014 1621   AST 14 10/24/2014 1621   ALT 12 10/24/2014 1621   ALKPHOS 82 10/24/2014 1621   BILITOT 0.5 10/24/2014 1621   GFRNONAA 60* 03/19/2011 0605   GFRAA >60 03/19/2011 0605    Lipase     Component Value Date/Time   LIPASE 20.0 10/24/2014 1621    Amylase    Component Value Date/Time   AMYLASE 17* 10/24/2014 1621     Assessment & Plan:  74 yo female with PMH of pancreatic divisum with remote pancreatitis (reports having a PD stent at some point placed by Dr. Filbert Schilder at [facility] years ago), peripheral vascular disease including celiac and renal artery stenosis (s/p renal artery stenting) on Plavix, gallstones status post cholecystectomy, hypertension, hyperlipidemia, nonerosive gastritis, history of abdominal adhesions and diverticulosis who is seen to provide a second opinion for episodic upper abdominal pain.  1. Episodic upper abdominal pain/pancreas divisum/s/p cholecystectomy/PVD -- she has multiple medical issues and the etiology of her upper abdominal discomfort is not immediately clear. She has known celiac artery narrowing, but her symptoms are episodic in nature and are not consistent with mesenteric ischemia given the lack of pain after eating and lack of weight loss weight loss. Her pain could be consistent  with pancreaticobiliary type pain (epigastric/RUQ radiating through to her back) without overt acute pancreatitis. Pancreatic and liver enzymes have been checked multiple times throughout the past year and have been normal.  It is not clear to me that they have been checked immediately following a severe episode of pain. I have recommended amylase, lipase, and liver enzymes the day after her next severe episode of pain. These labs will be left on hold and she can have them drawn after her  next attack.  I have explained that 95% of patients with pancreatic divisum are asymptomatic. Secretin-enhanced MRCP and possible EUS maybe very helpful in helping sort out if her pain is pancreaticobiliary related. This would likely be best performed at a tertiary care center, and given her experience at [facility], though remote, I recommend that she see Dr. Harl Bowie for further evaluation.  Functional pain, including IBS, remains in the differential, and she very well may benefit from Cymbalta. I will discuss this further with Dr. Carlean Purl as we continue to make recommendations regarding her symptoms.  40 minutes spent today with the patient and coordination of care, > 50% with the patient directly

## 2015-01-16 NOTE — Patient Instructions (Addendum)
Your physician has requested that you go to the basement for the following lab work the day AFTER you next severe episode of epigastric (upper) abdominal pain: CMP, Amylase, Lipase  Further recommendations will be made after case discussion with Dr Carlean Purl

## 2015-01-18 ENCOUNTER — Other Ambulatory Visit: Payer: Self-pay

## 2015-01-18 DIAGNOSIS — Q453 Other congenital malformations of pancreas and pancreatic duct: Secondary | ICD-10-CM

## 2015-01-18 NOTE — Progress Notes (Signed)
Put in referral to Dr. Obie Dredge at [facility] and routed the information to them, will await to hear appointment date/time.

## 2015-01-20 DIAGNOSIS — M7741 Metatarsalgia, right foot: Secondary | ICD-10-CM | POA: Diagnosis not present

## 2015-01-20 DIAGNOSIS — L84 Corns and callosities: Secondary | ICD-10-CM | POA: Diagnosis not present

## 2015-01-23 ENCOUNTER — Other Ambulatory Visit (INDEPENDENT_AMBULATORY_CARE_PROVIDER_SITE_OTHER): Payer: Medicare Other

## 2015-01-23 ENCOUNTER — Other Ambulatory Visit: Payer: Self-pay

## 2015-01-23 DIAGNOSIS — K589 Irritable bowel syndrome without diarrhea: Secondary | ICD-10-CM | POA: Diagnosis not present

## 2015-01-23 LAB — COMPREHENSIVE METABOLIC PANEL
ALK PHOS: 118 U/L — AB (ref 39–117)
ALT: 15 U/L (ref 0–35)
AST: 16 U/L (ref 0–37)
Albumin: 4 g/dL (ref 3.5–5.2)
BUN: 15 mg/dL (ref 6–23)
CALCIUM: 9.9 mg/dL (ref 8.4–10.5)
CHLORIDE: 90 meq/L — AB (ref 96–112)
CO2: 33 mEq/L — ABNORMAL HIGH (ref 19–32)
CREATININE: 0.75 mg/dL (ref 0.40–1.20)
GFR: 80.55 mL/min (ref >60.00)
Glucose, Bld: 126 mg/dL — ABNORMAL HIGH (ref 70–99)
Potassium: 3 mEq/L — ABNORMAL LOW (ref 3.5–5.1)
Sodium: 133 mEq/L — ABNORMAL LOW (ref 135–145)
Total Bilirubin: 0.7 mg/dL (ref 0.2–1.2)
Total Protein: 7.8 g/dL (ref 6.0–8.3)

## 2015-01-23 LAB — AMYLASE: AMYLASE: 17 U/L — AB (ref 27–131)

## 2015-01-23 LAB — LIPASE: Lipase: 15 U/L (ref 11.0–59.0)

## 2015-01-23 MED ORDER — POTASSIUM CHLORIDE ER 20 MEQ PO TBCR: 40.0000 meq | EXTENDED_RELEASE_TABLET | Freq: Every day | ORAL | Status: DC

## 2015-02-02 DIAGNOSIS — E1142 Type 2 diabetes mellitus with diabetic polyneuropathy: Secondary | ICD-10-CM | POA: Diagnosis not present

## 2015-02-02 DIAGNOSIS — E559 Vitamin D deficiency, unspecified: Secondary | ICD-10-CM | POA: Diagnosis not present

## 2015-02-02 DIAGNOSIS — R600 Localized edema: Secondary | ICD-10-CM | POA: Diagnosis not present

## 2015-02-02 DIAGNOSIS — E669 Obesity, unspecified: Secondary | ICD-10-CM | POA: Diagnosis not present

## 2015-02-02 DIAGNOSIS — E1129 Type 2 diabetes mellitus with other diabetic kidney complication: Secondary | ICD-10-CM | POA: Diagnosis not present

## 2015-02-02 DIAGNOSIS — Z6835 Body mass index (BMI) 35.0-35.9, adult: Secondary | ICD-10-CM | POA: Diagnosis not present

## 2015-02-02 DIAGNOSIS — E785 Hyperlipidemia, unspecified: Secondary | ICD-10-CM | POA: Diagnosis not present

## 2015-02-02 DIAGNOSIS — I10 Essential (primary) hypertension: Secondary | ICD-10-CM | POA: Diagnosis not present

## 2015-02-02 DIAGNOSIS — R809 Proteinuria, unspecified: Secondary | ICD-10-CM | POA: Diagnosis not present

## 2015-02-03 DIAGNOSIS — E559 Vitamin D deficiency, unspecified: Secondary | ICD-10-CM | POA: Diagnosis not present

## 2015-02-03 DIAGNOSIS — E1142 Type 2 diabetes mellitus with diabetic polyneuropathy: Secondary | ICD-10-CM | POA: Diagnosis not present

## 2015-02-06 ENCOUNTER — Telehealth: Payer: Self-pay | Admitting: Internal Medicine

## 2015-02-06 NOTE — Telephone Encounter (Signed)
Patient says she has not heard anything from Dr Harl Bowie about the appointment for her pancreas. Can you help her?

## 2015-02-07 NOTE — Telephone Encounter (Signed)
Spoke to [place] at Dr Nelly Laurence office, she's checking on the appt and will call me back.

## 2015-02-14 NOTE — Telephone Encounter (Signed)
Spoke with [employer] at [place].  She is unable to find our referral notes and is requesting that we re-fax to # [phone].  I will do this now and we will await there response.

## 2015-02-16 NOTE — Telephone Encounter (Signed)
Candice called from [place] to let us know she is calling patient with appointment information to see Dr. Obie Dredge July 25th 2016.  She confirmed phone number and address.

## 2015-02-27 ENCOUNTER — Other Ambulatory Visit: Payer: Self-pay

## 2015-02-27 DIAGNOSIS — R1013 Epigastric pain: Secondary | ICD-10-CM | POA: Diagnosis not present

## 2015-03-07 ENCOUNTER — Encounter: Payer: Self-pay | Admitting: Gastroenterology

## 2015-03-07 DIAGNOSIS — I509 Heart failure, unspecified: Secondary | ICD-10-CM | POA: Diagnosis not present

## 2015-03-07 DIAGNOSIS — R9431 Abnormal electrocardiogram [ECG] [EKG]: Secondary | ICD-10-CM | POA: Diagnosis not present

## 2015-03-07 DIAGNOSIS — E039 Hypothyroidism, unspecified: Secondary | ICD-10-CM | POA: Diagnosis not present

## 2015-03-07 DIAGNOSIS — K551 Chronic vascular disorders of intestine: Secondary | ICD-10-CM | POA: Diagnosis not present

## 2015-03-07 DIAGNOSIS — Z5309 Procedure and treatment not carried out because of other contraindication: Secondary | ICD-10-CM | POA: Diagnosis not present

## 2015-03-07 DIAGNOSIS — R1013 Epigastric pain: Secondary | ICD-10-CM | POA: Diagnosis not present

## 2015-03-07 DIAGNOSIS — I252 Old myocardial infarction: Secondary | ICD-10-CM | POA: Diagnosis not present

## 2015-03-07 DIAGNOSIS — R0602 Shortness of breath: Secondary | ICD-10-CM | POA: Diagnosis not present

## 2015-03-07 DIAGNOSIS — K219 Gastro-esophageal reflux disease without esophagitis: Secondary | ICD-10-CM | POA: Diagnosis not present

## 2015-03-07 DIAGNOSIS — I701 Atherosclerosis of renal artery: Secondary | ICD-10-CM | POA: Diagnosis not present

## 2015-03-07 DIAGNOSIS — I25709 Atherosclerosis of coronary artery bypass graft(s), unspecified, with unspecified angina pectoris: Secondary | ICD-10-CM | POA: Insufficient documentation

## 2015-03-07 DIAGNOSIS — I10 Essential (primary) hypertension: Secondary | ICD-10-CM | POA: Insufficient documentation

## 2015-03-07 DIAGNOSIS — E785 Hyperlipidemia, unspecified: Secondary | ICD-10-CM | POA: Diagnosis not present

## 2015-03-07 DIAGNOSIS — I2510 Atherosclerotic heart disease of native coronary artery without angina pectoris: Secondary | ICD-10-CM | POA: Diagnosis not present

## 2015-03-07 HISTORY — PX: DOBUTAMINE STRESS ECHO: SHX5426

## 2015-03-08 ENCOUNTER — Telehealth: Payer: Self-pay | Admitting: Cardiology

## 2015-03-08 ENCOUNTER — Other Ambulatory Visit: Payer: Self-pay | Admitting: Cardiology

## 2015-03-08 NOTE — Telephone Encounter (Signed)
Rx(s) sent to pharmacy electronically.  

## 2015-03-08 NOTE — Telephone Encounter (Signed)
Received records from [facility] for appointment with Dr Ellyn Hack on 03/15/15.  Records given to [employer] (medical records) for Dr Allison Quarry schedule  On7/13/16. lp

## 2015-03-09 DIAGNOSIS — I10 Essential (primary) hypertension: Secondary | ICD-10-CM | POA: Diagnosis not present

## 2015-03-09 DIAGNOSIS — R600 Localized edema: Secondary | ICD-10-CM | POA: Diagnosis not present

## 2015-03-15 ENCOUNTER — Ambulatory Visit (INDEPENDENT_AMBULATORY_CARE_PROVIDER_SITE_OTHER): Payer: Medicare Other | Admitting: Cardiology

## 2015-03-15 ENCOUNTER — Encounter: Payer: Self-pay | Admitting: Cardiology

## 2015-03-15 VITALS — BP 144/72 | HR 79 | Ht 68.0 in | Wt 218.5 lb

## 2015-03-15 DIAGNOSIS — I2510 Atherosclerotic heart disease of native coronary artery without angina pectoris: Secondary | ICD-10-CM | POA: Diagnosis not present

## 2015-03-15 DIAGNOSIS — Z01810 Encounter for preprocedural cardiovascular examination: Secondary | ICD-10-CM

## 2015-03-15 DIAGNOSIS — R9430 Abnormal result of cardiovascular function study, unspecified: Secondary | ICD-10-CM | POA: Insufficient documentation

## 2015-03-15 DIAGNOSIS — R9431 Abnormal electrocardiogram [ECG] [EKG]: Secondary | ICD-10-CM | POA: Insufficient documentation

## 2015-03-15 DIAGNOSIS — E669 Obesity, unspecified: Secondary | ICD-10-CM

## 2015-03-15 DIAGNOSIS — I87302 Chronic venous hypertension (idiopathic) without complications of left lower extremity: Secondary | ICD-10-CM

## 2015-03-15 DIAGNOSIS — I10 Essential (primary) hypertension: Secondary | ICD-10-CM | POA: Diagnosis not present

## 2015-03-15 DIAGNOSIS — Z9861 Coronary angioplasty status: Secondary | ICD-10-CM | POA: Diagnosis not present

## 2015-03-15 DIAGNOSIS — E785 Hyperlipidemia, unspecified: Secondary | ICD-10-CM

## 2015-03-15 DIAGNOSIS — Z8719 Personal history of other diseases of the digestive system: Secondary | ICD-10-CM

## 2015-03-15 DIAGNOSIS — I5030 Unspecified diastolic (congestive) heart failure: Secondary | ICD-10-CM

## 2015-03-15 MED ORDER — LOSARTAN POTASSIUM 25 MG PO TABS: 25.0000 mg | ORAL_TABLET | Freq: Every day | ORAL | Status: DC

## 2015-03-15 MED ORDER — CARVEDILOL 3.125 MG PO TABS: 3.1250 mg | ORAL_TABLET | Freq: Two times a day (BID) | ORAL | Status: DC

## 2015-03-15 NOTE — Assessment & Plan Note (Signed)
Part 2 related to venous stasis. I don't think her diastolic function is significant for culture and edema. His artery on Lasix with when necessary Zaroxolyn.

## 2015-03-15 NOTE — Assessment & Plan Note (Signed)
Her baseline EKG is abnormal. Unfortunately I guess that I don't have the EKG from [facility]. There was concern that there were some differences concerning for ischemia on the EKG and now she has a stress test with ischemic EKG changes. She has baseline ST depressions or T-wave inversions.  I hope is that the change is not consistent with ischemia however, I think the only definitive way to answer the question is did perform catheterization. During her last cath, she did not have any significant stenoses percent and segments. The lack of wall motion abnormality with peak dobutamine dose at her heart rate would argue that she hopefully would not have a significant lesion. My concern would be what we do if we do find a lesion. She would not want to delay her GI procedure and all the possible this would mean that the stent would probably be the best option.

## 2015-03-15 NOTE — Assessment & Plan Note (Signed)
Interestingly, her stress echo suggests normal diastolic pressures, but previous catheterizations and echoes have demonstrated diastolic dysfunction. I do think that some of her exertional dyspnea is related to diastolic dysfunction along with deconditioning. She could very well have microvascular ischemia with increased wall stress. For some reason she's no longer on the beta blocker or ARB. Elizabeth to get her back to the original doses, we'll start carvedilol 2.5 twice a day and ARB with losartan at 25 mg daily. Hopefully we can titrate up over the next month or so. She is on standing dose of Lasix with intermittent Zaroxolyn. We will reassess her LVEDP.

## 2015-03-15 NOTE — Assessment & Plan Note (Addendum)
Unfortunately, she did originally have coronary disease with stenoses leading to two-vessel PCI and had some in-stent restenosis on followup evaluation. She has had several stress tests and cardiac catheterizations since then that have not shown any significant changes. She has chronic exertional dyspnea and chest discomfort which I don't think is macrovascular as it is not much different than it has been before. However now with a "abnormal" stress test we will proceed with diagnostic cardiac catheterization.  She is on aspirin plus Plavix which can be held if we don't find any new stenoses.  Restart beta blocker and ARB. She is statin intolerant in the past. This can be evaluated further following her GI procedure.  We have discussed Imdur versus Ranexa during last visit. Without her being on the beta blocker or ARB, I am reluctant to start either one of these.

## 2015-03-15 NOTE — Assessment & Plan Note (Signed)
She is now about 7 pounds down since March.  Probably related to her GI issues. We again talked about the importance of adjusting her diet and trying to pick up her exercise. Hopefully with a repeat evaluation of her coronary angiography we will be able to more strongly encourage exercise.

## 2015-03-15 NOTE — Assessment & Plan Note (Addendum)
I don't know how her medications were discontinued. Restart beta blocker therapy along with ARB

## 2015-03-15 NOTE — Assessment & Plan Note (Signed)
This situation with me every very difficult situation to understand the best course of action. I am personally do not have any of the imaging or EKG data the go along with the stress test. Classically warm and consider positive EKG stress test with normal imaging to be a negative stress test. Unfortunately the consultation on this report is that it is positive and abnormal. This is in the setting of fact the patient does have symptoms of chest discomfort, which she considered to be angina and exertional dyspnea. At this point that the only definitive course of action would be to proceed with left heart catheterization with coronary angiography.  I will also restart her beta blocker and ARB that she has not been taking. I think this could explain some exertional dyspnea from diastolic dysfunction.

## 2015-03-15 NOTE — Progress Notes (Signed)
PCP: Yong Channel, MD  Clinic Note: Chief Complaint  Patient presents with  . Follow-up     has chest pain today and frequently, frequently shortness of breath, has edema, no pain in legs, has cramping in legs, has lightheadedness, has dizziness  . Pre-op Exam  . Coronary Artery Disease    Status post PCI, now with abnormal stress test    HPI: [name] is a 74 y.o. female with a PMH below who presents today for work in visit after having a Dobutamine Stress Echo performed @ [facility] as part of Pre-operative evaluation for EUS/ERCP -->  The note states that procedure was canceled due to her EKG changed and she had dobutamine echocardiogram. Anesthesia is now requesting her to be cleared. The patient has a baseline ST depressions on her EKG and during her EKG portion of the stress test she had 1 mm downsloping ST segments. She had no chest discomfort, and had no wall motion abnormalities at rest or peak exercise/stress.  [name] was last seen on 06/06/2014 following referral to Dr. Gwenlyn Found and Dr. Trula Slade for evaluation of possible Mesenteric Ischemia.  She also had cardiac catheterization done at the time of her SMA angiography showed patent LAD and RCA stents. She did have a 95% ostial celiac trunk stenosis and mild SMA disease. They felt that the most appropriate course of action with medical management and not to proceed with intervention. Also noted was that she did not tolerate an increase in beta blocker to Toprol 5 mg twice a day.  She was still noting some mild exertional chest discomfort, so we discussed the possibility of adding a nitrate versus Ranexa. She is statin intolerant. Plan is to consider referral to our clinical pharmacist for evaluation of PCSK 9 inhibitor options. When I last saw her, she was on carvedilol 6.25 twice a day, unable to tolerate titration up to 12.5 twice a day. She was also on losartan 100mg  daily, apparently both these medicines have been dropped  somehow.   Studies Reviewed: Dobutamine Stress Echo 03/07/2015 --> (will be scanned), report summarized in [place].  Interval History: [name] presents today as usual complaining of intermittent chest discomfort either with rest or with exertion as well as exertional dyspnea. Both her husband and daughter are here with her today and described her exertional dyspnea is worse and she indicates. Stating that she can barely walk from one end of the house and the other in the house without gasping for breath. Big concern for her is to read or quadrant discomfort. She was apparently evaluated for some type of the hepatobiliary issues at [facility] and the plan is for her to have an endoscopic ultrasound and ERCP. However during this evaluation they found an abnormal EKG. I unfortunately do not have a copy of this EKG, but apparently it was different and therefore they were concerned about that she needed to the urgent evaluation. Due to her insistence on expediting having her GI procedure, they refer her to have a stress test performed by the cardiology. This was a dobutamine stress echo that was positive an EKG standpoint but normal from imaging standpoint. She is now referred to have definitive evaluation. Unfortunately she does continued to complain of exertional dyspnea and chest discomfort as she is described. She two-pillow orthopnea and chronic intermittent lower extremity edema.   Cardiovascular ROS: positive for - chest pain, dyspnea on exertion, edema, orthopnea and palpitations negative for - irregular heartbeat, loss of consciousness, murmur, palpitations, paroxysmal nocturnal  dyspnea, rapid heart rate or Near syncope, TIA/amaurosis fugax   Past Medical History  Diagnosis Date  . CAD S/P percutaneous coronary angioplasty     a) LHC: 07/30/10. -- 3.0x16mm Integrity BMS to pRCA & 2.5x 62mm Integrity BMS mLAD(@ D2).  b) Class III-IV Angina 03/2011: LHC- ISR in LAD BMS -- prox overlapping Promus DES 2.5x33mm  and PTCA of jailed D2 ostium-prox 80%. c) 02/03/12:  LHC- patent stents.  Jailed diagonal. with stable flow; d) Peri-OP NSTEMI 04/2012 - LHC in 12/'13 -   . Congestive heart failure with LV diastolic dysfunction, NYHA class 2     06/13/10:  last 2D echo-  EF >55%, Mild TR, Mod Conc LVH - Grade 1 diastolic dysfunction (abnormal relaxation) --> LVEDP on Cath 28 mmHg & mild 2nd Pulm HTN  . Renal artery stenosis 2011; 12/'13    a) Angiogram 02/03/12:  50-60%L RA stenosis, 40% R R Inferior artery; b) 12/'13: S/P L RA Stent ([facility]) 6.0 mm x 15 mm; c) Renal Duplex 10/2013: <60% L RA, <60 R RA, ~60% SMA & Celiac A.  . Labile essential hypertension     Partially related to RAS  . Dyslipidemia, goal LDL below 70     Intolerant to statins  . NSTEMI (non-ST elevated myocardial infarction) 04/2012    Unclear the details, apparently this was postoperative from her back surgery that she was cleared for my last saw her in June.  Reportedly had stents placed  . Bilateral edema of lower extremity     Chronic, likely related to venous stasis  . Irritable bowel syndrome (IBS)   . Diverticulitis of colon (without mention of hemorrhage)   . Hepatomegaly   . Pancreas divisum     On pancreatic enzyme  . Calculus of kidney   . Unspecified disorder of thyroid   . Back pain with radiation     Chronic Back Pain - mutliple surgeries (including tumor removal)  . GERD (gastroesophageal reflux disease)   . Hiatal hernia   . Unspecified gastritis and gastroduodenitis without mention of hemorrhage   . Stricture and stenosis of esophagus   . Arthritis   . Blood transfusion without reported diagnosis   . Ulcer   . Mesenteric artery stenosis     95% Celiac Artery - ostial, 20-30% SMA.  Bilateral Renal A: L RA stent patent, R RA 20-30% -- Conservative Management    Past Surgical History  Procedure Laterality Date  . Cholecystectomy    . Minor papilla stent      Dr Pershing Proud  . Knee surgery      left  . Foot  surgery      right and left  . Hip surgery      left  . Breast ductal system excision      right  . Abdominal hysterectomy    . Hand surgery      left  . Spine surgery      tumor removed 07/2010; Redo Surgery 04/2012; Sacroiliac Sgx 08/2013  . Coronary angioplasty with stent placement  07/2010, 02/2011    LHC: 07/30/10.  3.0x76mm Integrity BMS to RCA and 2.5x 1mm Integrity BMS LAD.  03/2011: LHC- ISRS in LAD treated with overlapping Promus DES 2.5x55mm and PTCA of Diagonal.  02/03/12:  LHC- patent stents.  Jailed diagonal.  . Cardiac catheterization  03/2012    ~30% ISR RCA, patent LAD stent & D2; EDP ~28 mmHg  . Cardiovascular stress test  11/07/11:  Normal perfusion pattern.  EF 66% No wall motion abnormalities.   . Renal artery stent Left 08/2012    @ [facility] - 6.0 mm x 15 mm  . Cardiac catheterization  08/2012    Peri-Op MI @ [facility] -- 40% ostial D1, patent LAD stents, 10% RCA ISR  . Colonoscopy  03/01/2009    normal   . Esophagogastroduodenoscopy  04/08/2012  . Cataract extraction    . Left heart catheterization with coronary angiogram N/A 02/03/2012    Procedure: LEFT HEART CATHETERIZATION WITH CORONARY ANGIOGRAM;  Surgeon: Leonie Man, MD;  Location: [facility] CATH LAB;  Service: Cardiovascular;  Laterality: N/A;  . Visceral angiogram N/A 05/12/2014    Procedure: VISCERAL ANGIOGRAM;  Surgeon: Lorretta Harp, MD;  Location: [facility] CATH LAB;  Service: Cardiovascular;  Laterality: N/A;  . Left heart catheterization with coronary angiogram N/A 05/12/2014    Procedure: LEFT HEART CATHETERIZATION WITH CORONARY ANGIOGRAM;  Surgeon: Lorretta Harp, MD;  Location[facility] CATH LAB;  Service: Cardiovascular;  Laterality: N/A;  . Dobutamine stress echo  03/07/2015    [facility] (ordered for pre-op evaluation for EUS/ERCP --> abnormal EKG:  strreesss test shhoowwed 1 mm ST segment depressions downsloping. No wall motion abnormalities at peak exercise or at rest. Diastolic dysfunction was  noted but normal systolic function - EF greater than 55%. No bouts of regurgitation or stenosis. Resting hypertension with exaggerated response    ROS: A comprehensive was performed.pertinent positives noted above Review of Systems  Constitutional: Positive for malaise/fatigue.  HENT: Negative for nosebleeds.   Cardiovascular: Negative for claudication.  Gastrointestinal: Positive for abdominal pain. Negative for blood in stool and melena.       Loose stools  Genitourinary: Negative for hematuria.  Musculoskeletal: Negative for falls.  All other systems reviewed and are negative.   Current Outpatient Prescriptions on File Prior to Visit  Medication Sig Dispense Refill  . aspirin 325 MG tablet Take 325 mg by mouth daily.    . clopidogrel (PLAVIX) 75 MG tablet Take 1 tablet (75 mg total) by mouth daily. 30 tablet 6  . furosemide (LASIX) 40 MG tablet Take 2 tablets by mouth in the morning and 1 tablet in the evening 90 tablet 2  . gabapentin (NEURONTIN) 600 MG tablet Take 1,200 mg by mouth 3 (three) times daily.    Marland Kitchen lidocaine (XYLOCAINE) 5 % ointment Apply 1 application topically daily.    . metolazone (ZAROXOLYN) 2.5 MG tablet Take 2.5 mg by mouth every other day.    . nitroGLYCERIN (NITROSTAT) 0.4 MG SL tablet Place 1 tablet (0.4 mg total) under the tongue every 5 (five) minutes as needed for chest pain. 25 tablet 6  . Pancrelipase, Lip-Prot-Amyl, (ZENPEP) 20000 UNITS CPEP Take 40,000 Units by mouth 3 (three) times daily.    . pantoprazole (PROTONIX) 40 MG tablet Take 1 tablet (40 mg total) by mouth daily. (Patient not taking: Reported on 03/15/2015) 30 tablet 11  . potassium chloride 20 MEQ TBCR Take 40 mEq by mouth daily. 60 tablet 0   No current facility-administered medications on file prior to visit.   Allergies  Allergen Reactions  . Codeine Phosphate Swelling  . Statins     Intolerant   . Tramadol     Kept patient awake  . Iodine Swelling and Rash    REACTION:  unspecified This allergy is to Topical iodine only.   . Prednisone Itching and Rash    History  Social History  . Marital Status: Married    Spouse Name: N/A  . Number of Children: 1  . Years of Education: N/A   Occupational History  . gift shop owner    Social History Main Topics  . Smoking status: Never Smoker   . Smokeless tobacco: Never Used  . Alcohol Use: No  . Drug Use: No  . Sexual Activity: Not on file   Other Topics Concern  . Not on file   Social History Narrative   Married mother of one, one grandchild.   Very socially active, enjoys cooking and having get-togethers her house. She had been exercising regularly but her back pain limits her.   Does not smoke, does not drink alcohol.   Family History  Problem Relation Age of Onset  . Cancer Father     mets  . Heart attack Brother   . Heart attack Brother   . Heart attack Father   . Stroke Mother   . Colitis Maternal Grandfather   . Diabetes Brother   . Colon cancer Neg Hx      Wt Readings from Last 3 Encounters:  03/15/15 99.111 kg (218 lb 8 oz)  01/16/15 98.431 kg (217 lb)  11/09/14 102.059 kg (225 lb)    PHYSICAL EXAM BP 144/72 mmHg  Pulse 79  Ht 5\' 8"  (1.727 m)  Wt 99.111 kg (218 lb 8 oz)  BMI 33.23 kg/m2 General appearance: alert, cooperative, appears stated age, no distress and mildly obese  HEENT: [place]/AT, EOMI, MMM, anicteric sclera  Neck: no adenopathy, soft R carotid bruit, no JVD and supple, symmetrical, trachea midline  Lungs: clear to auscultation bilaterally, normal percussion bilaterally and Nonlabored, good air movement  Heart: RRR, normal S1 and S2 with an S4 gallop. 1/6 SEM at RUSB --> carotids. Nondisplaced PMI. No rubs  Abdomen: soft, non-tender; bowel sounds normal; no masses, no organomegaly  Extremities: no clubbing or cyanosis but with 1+ bilateral edema with varicosities.  Pulses: 2+ and symmetric  Neurologic: Mental status: Alert, oriented, thought content  appropriate  Cranial nerves: normal; normal strength    Adult ECG Report  Rate: 79 ;  Rhythm: normal sinus rhythm,  LVH with nonspecific ST depressions. There are inferior and lateral ST and T-wave depressions with down-sloping T waves that are consistent with previous EKG findings from October 2015.  Narrative Interpretation: stable EKG, cannot exclude ischemia   Other studies Reviewed: Additional studies/ records that were reviewed today include:  Recent Labs:     Chemistry      Component Value Date/Time   NA 133* 01/23/2015 0941   K 3.0* 01/23/2015 0941   CL 90* 01/23/2015 0941   CO2 33* 01/23/2015 0941   BUN 15 01/23/2015 0941   CREATININE 0.75 01/23/2015 0941   CREATININE 0.72 05/06/2014 1022      Component Value Date/Time   CALCIUM 9.9 01/23/2015 0941   ALKPHOS 118* 01/23/2015 0941   AST 16 01/23/2015 0941   ALT 15 01/23/2015 0941   BILITOT 0.7 01/23/2015 0941     No recent lipids available  ASSESSMENT / PLAN: Problem List Items Addressed This Visit    Abnormal electrocardiogram during exercise stress test    Her baseline EKG is abnormal. Unfortunately I guess that I don't have the EKG from [facility]. There was concern that there were some differences concerning for ischemia on the EKG and now she has a stress test with ischemic EKG changes. She has baseline ST depressions or T-wave inversions.  I hope is that the change is not consistent with ischemia however, I think the only definitive way to answer the question is did perform catheterization. During her last cath, she did not have any significant stenoses percent and segments. The lack of wall motion abnormality with peak dobutamine dose at her heart rate would argue that she hopefully would not have a significant lesion. My concern would be what we do if we do find a lesion. She would not want to delay her GI procedure and all the possible this would mean that the stent would probably be the best option.      Relevant  Orders   EKG 12-Lead   LEFT HEART CATHETERIZATION WITH CORONARY ANGIOGRAM   CAD S/P percutaneous coronary angioplasty: pRCA BMS, mLAD BMS overlapped prox with DES for ISR (Chronic)    Unfortunately, she did originally have coronary disease with stenoses leading to two-vessel PCI and had some in-stent restenosis on followup evaluation. She has had several stress tests and cardiac catheterizations since then that have not shown any significant changes. She has chronic exertional dyspnea and chest discomfort which I don't think is macrovascular as it is not much different than it has been before. However now with a "abnormal" stress test we will proceed with diagnostic cardiac catheterization.  She is on aspirin plus Plavix which can be held if we don't find any new stenoses.  Restart beta blocker and ARB. She is statin intolerant in the past. This can be evaluated further following her GI procedure.  We have discussed Imdur versus Ranexa during last visit. Without her being on the beta blocker or ARB, I am reluctant to start either one of these.      Relevant Medications   carvedilol (COREG) 3.125 MG tablet   losartan (COZAAR) 25 MG tablet   Other Relevant Orders   EKG 12-Lead   LEFT HEART CATHETERIZATION WITH CORONARY ANGIOGRAM   Congestive heart failure with LV diastolic dysfunction, NYHA class 2 (Chronic)    Interestingly, her stress echo suggests normal diastolic pressures, but previous catheterizations and echoes have demonstrated diastolic dysfunction. I do think that some of her exertional dyspnea is related to diastolic dysfunction along with deconditioning. She could very well have microvascular ischemia with increased wall stress. For some reason she's no longer on the beta blocker or ARB. Elizabeth to get her back to the original doses, we'll start carvedilol 2.5 twice a day and ARB with losartan at 25 mg daily. Hopefully we can titrate up over the next month or so. She is on standing  dose of Lasix with intermittent Zaroxolyn. We will reassess her LVEDP.      Relevant Medications   carvedilol (COREG) 3.125 MG tablet   losartan (COZAAR) 25 MG tablet   Other Relevant Orders   EKG 12-Lead   LEFT HEART CATHETERIZATION WITH CORONARY ANGIOGRAM   Hx of pancreatitis   Labile essential hypertension (Chronic)    I don't know how her medications were discontinued. Restart beta blocker therapy along with ARB      Relevant Medications   carvedilol (COREG) 3.125 MG tablet   losartan (COZAAR) 25 MG tablet   Other Relevant Orders   EKG 12-Lead   LEFT HEART CATHETERIZATION WITH CORONARY ANGIOGRAM   Obesity (BMI 30.0-34.9) (Chronic)    She is now about 7 pounds down since March.  Probably related to her GI issues. We again talked about the importance of adjusting her diet and trying to pick up her exercise. Hopefully  with a repeat evaluation of her coronary angiography we will be able to more strongly encourage exercise.      Pre-operative cardiovascular examination, new EKG abnormalities c/w ischemia - Primary    This situation with me every very difficult situation to understand the best course of action. I am personally do not have any of the imaging or EKG data the go along with the stress test. Classically warm and consider positive EKG stress test with normal imaging to be a negative stress test. Unfortunately the consultation on this report is that it is positive and abnormal. This is in the setting of fact the patient does have symptoms of chest discomfort, which she considered to be angina and exertional dyspnea. At this point that the only definitive course of action would be to proceed with left heart catheterization with coronary angiography.  I will also restart her beta blocker and ARB that she has not been taking. I think this could explain some exertional dyspnea from diastolic dysfunction.      Relevant Orders   EKG 12-Lead   LEFT HEART CATHETERIZATION WITH  CORONARY ANGIOGRAM   Stasis edema of bilateral lower extremity    Part 2 related to venous stasis. I don't think her diastolic function is significant for culture and edema. His artery on Lasix with when necessary Zaroxolyn.         Current medicines are reviewed at length with the patient today. (+/- concerns) she is not currently taking either her beta blocker or ARB, indicating that her blood pressure was good and she did not think she needed to be on. The following changes have been made: restart carvedilol 3.25 twice a day and losartan 25 mg daily   F/u - ~1 month    Makell Drohan, Leonie Green, M.D., M.S. Interventional Cardiologist   Pager # [phone]

## 2015-03-15 NOTE — Patient Instructions (Addendum)
Your physician has requested that you have a cardiac catheterization. Cardiac catheterization is used to diagnose and/or treat various heart conditions. Doctors may recommend this procedure for a number of different reasons. The most common reason is to evaluate chest pain. Chest pain can be a symptom of coronary artery disease (CAD), and cardiac catheterization can show whether plaque is narrowing or blocking your heart's arteries. This procedure is also used to evaluate the valves, as well as measure the blood flow and oxygen levels in different parts of your heart. For further information please visit [email or web address]. Please follow instruction sheet, as given.    COREG ( CARVEDILOL) 3.125 MG TWICE A DAY  LOSARTAN 25 MG  ONE TABLET DAILY.

## 2015-03-16 ENCOUNTER — Ambulatory Visit (HOSPITAL_COMMUNITY)
Admission: RE | Admit: 2015-03-16 | Discharge: 2015-03-16 | Disposition: A | Discharge status: Home / Self Care | Payer: Medicare Other | Source: Ambulatory Visit | Attending: Cardiology | Admitting: Cardiology

## 2015-03-16 ENCOUNTER — Encounter (HOSPITAL_COMMUNITY)
Admission: RE | Disposition: A | Discharge status: Home / Self Care | Payer: Self-pay | Source: Ambulatory Visit | Attending: Cardiology

## 2015-03-16 ENCOUNTER — Encounter (HOSPITAL_COMMUNITY): Payer: Self-pay | Admitting: Cardiology

## 2015-03-16 DIAGNOSIS — I2510 Atherosclerotic heart disease of native coronary artery without angina pectoris: Secondary | ICD-10-CM | POA: Diagnosis not present

## 2015-03-16 DIAGNOSIS — Z7982 Long term (current) use of aspirin: Secondary | ICD-10-CM | POA: Insufficient documentation

## 2015-03-16 DIAGNOSIS — R160 Hepatomegaly, not elsewhere classified: Secondary | ICD-10-CM | POA: Insufficient documentation

## 2015-03-16 DIAGNOSIS — T82857D Stenosis of cardiac prosthetic devices, implants and grafts, subsequent encounter: Secondary | ICD-10-CM | POA: Insufficient documentation

## 2015-03-16 DIAGNOSIS — Z01810 Encounter for preprocedural cardiovascular examination: Secondary | ICD-10-CM

## 2015-03-16 DIAGNOSIS — E785 Hyperlipidemia, unspecified: Secondary | ICD-10-CM | POA: Diagnosis not present

## 2015-03-16 DIAGNOSIS — Z9861 Coronary angioplasty status: Secondary | ICD-10-CM

## 2015-03-16 DIAGNOSIS — E669 Obesity, unspecified: Secondary | ICD-10-CM | POA: Diagnosis not present

## 2015-03-16 DIAGNOSIS — E079 Disorder of thyroid, unspecified: Secondary | ICD-10-CM | POA: Insufficient documentation

## 2015-03-16 DIAGNOSIS — I272 Other secondary pulmonary hypertension: Secondary | ICD-10-CM | POA: Insufficient documentation

## 2015-03-16 DIAGNOSIS — Z883 Allergy status to other anti-infective agents status: Secondary | ICD-10-CM | POA: Insufficient documentation

## 2015-03-16 DIAGNOSIS — R0609 Other forms of dyspnea: Secondary | ICD-10-CM | POA: Diagnosis present

## 2015-03-16 DIAGNOSIS — Z6833 Body mass index (BMI) 33.0-33.9, adult: Secondary | ICD-10-CM | POA: Diagnosis not present

## 2015-03-16 DIAGNOSIS — G8929 Other chronic pain: Secondary | ICD-10-CM | POA: Diagnosis not present

## 2015-03-16 DIAGNOSIS — I25119 Atherosclerotic heart disease of native coronary artery with unspecified angina pectoris: Secondary | ICD-10-CM | POA: Diagnosis not present

## 2015-03-16 DIAGNOSIS — K219 Gastro-esophageal reflux disease without esophagitis: Secondary | ICD-10-CM | POA: Diagnosis not present

## 2015-03-16 DIAGNOSIS — I10 Essential (primary) hypertension: Secondary | ICD-10-CM | POA: Diagnosis not present

## 2015-03-16 DIAGNOSIS — Z7902 Long term (current) use of antithrombotics/antiplatelets: Secondary | ICD-10-CM | POA: Insufficient documentation

## 2015-03-16 DIAGNOSIS — R9431 Abnormal electrocardiogram [ECG] [EKG]: Secondary | ICD-10-CM

## 2015-03-16 DIAGNOSIS — I252 Old myocardial infarction: Secondary | ICD-10-CM | POA: Insufficient documentation

## 2015-03-16 DIAGNOSIS — Y712 Prosthetic and other implants, materials and accessory cardiovascular devices associated with adverse incidents: Secondary | ICD-10-CM | POA: Insufficient documentation

## 2015-03-16 DIAGNOSIS — M549 Dorsalgia, unspecified: Secondary | ICD-10-CM | POA: Diagnosis not present

## 2015-03-16 DIAGNOSIS — I5032 Chronic diastolic (congestive) heart failure: Secondary | ICD-10-CM | POA: Diagnosis not present

## 2015-03-16 DIAGNOSIS — E1169 Type 2 diabetes mellitus with other specified complication: Secondary | ICD-10-CM | POA: Diagnosis present

## 2015-03-16 DIAGNOSIS — I701 Atherosclerosis of renal artery: Secondary | ICD-10-CM | POA: Diagnosis not present

## 2015-03-16 DIAGNOSIS — K449 Diaphragmatic hernia without obstruction or gangrene: Secondary | ICD-10-CM | POA: Diagnosis not present

## 2015-03-16 DIAGNOSIS — R9430 Abnormal result of cardiovascular function study, unspecified: Secondary | ICD-10-CM | POA: Diagnosis present

## 2015-03-16 DIAGNOSIS — Z87442 Personal history of urinary calculi: Secondary | ICD-10-CM | POA: Insufficient documentation

## 2015-03-16 DIAGNOSIS — I5030 Unspecified diastolic (congestive) heart failure: Secondary | ICD-10-CM | POA: Diagnosis present

## 2015-03-16 DIAGNOSIS — R9439 Abnormal result of other cardiovascular function study: Secondary | ICD-10-CM | POA: Diagnosis present

## 2015-03-16 HISTORY — PX: CARDIAC CATHETERIZATION: SHX172

## 2015-03-16 LAB — BASIC METABOLIC PANEL
ANION GAP: 9 (ref 5–15)
BUN: 14 mg/dL (ref 6–20)
CO2: 32 mmol/L (ref 22–32)
CREATININE: 0.89 mg/dL (ref 0.44–1.00)
Calcium: 8.9 mg/dL (ref 8.9–10.3)
Chloride: 97 mmol/L — ABNORMAL LOW (ref 101–111)
GFR calc Af Amer: >60 mL/min (ref >60)
GFR calc non Af Amer: >60 mL/min (ref >60)
Glucose, Bld: 155 mg/dL — ABNORMAL HIGH (ref 65–99)
POTASSIUM: 2.9 mmol/L — AB (ref 3.5–5.1)
Sodium: 138 mmol/L (ref 135–145)

## 2015-03-16 LAB — CBC
HEMATOCRIT: 37.5 % (ref 36.0–46.0)
Hemoglobin: 12.6 g/dL (ref 12.0–15.0)
MCH: 28.6 pg (ref 26.0–34.0)
MCHC: 33.6 g/dL (ref 30.0–36.0)
MCV: 85.2 fL (ref 78.0–100.0)
Platelets: 230 10*3/uL (ref 150–400)
RBC: 4.4 MIL/uL (ref 3.87–5.11)
RDW: 13.9 % (ref 11.5–15.5)
WBC: 8.4 10*3/uL (ref 4.0–10.5)

## 2015-03-16 LAB — POCT I-STAT, CHEM 8
BUN: 13 mg/dL (ref 6–20)
CREATININE: 0.7 mg/dL (ref 0.44–1.00)
Calcium, Ion: 1.14 mmol/L (ref 1.13–1.30)
Chloride: 97 mmol/L — ABNORMAL LOW (ref 101–111)
Glucose, Bld: 134 mg/dL — ABNORMAL HIGH (ref 65–99)
HEMATOCRIT: 34 % — AB (ref 36.0–46.0)
Hemoglobin: 11.6 g/dL — ABNORMAL LOW (ref 12.0–15.0)
POTASSIUM: 3.1 mmol/L — AB (ref 3.5–5.1)
Sodium: 137 mmol/L (ref 135–145)
TCO2: 28 mmol/L (ref 0–100)

## 2015-03-16 LAB — PROTIME-INR
INR: 0.97 (ref 0.00–1.49)
Prothrombin Time: 13.1 seconds (ref 11.6–15.2)

## 2015-03-16 SURGERY — LEFT HEART CATH AND CORONARY ANGIOGRAPHY
Anesthesia: LOCAL

## 2015-03-16 MED ORDER — PANTOPRAZOLE SODIUM 40 MG PO TBEC: 40.0000 mg | DELAYED_RELEASE_TABLET | Freq: Every day | ORAL | Status: DC

## 2015-03-16 MED ORDER — SODIUM CHLORIDE 0.9 % IV SOLN: 250.0000 mL | INTRAVENOUS | Status: DC | PRN

## 2015-03-16 MED ORDER — NITROGLYCERIN 1 MG/10 ML FOR IR/CATH LAB
INTRA_ARTERIAL | Status: AC
  Filled 2015-03-16: qty 10

## 2015-03-16 MED ORDER — ONDANSETRON HCL 4 MG/2ML IJ SOLN: 4.0000 mg | Freq: Four times a day (QID) | INTRAMUSCULAR | Status: DC | PRN

## 2015-03-16 MED ORDER — FENTANYL CITRATE (PF) 100 MCG/2ML IJ SOLN
INTRAMUSCULAR | Status: AC
  Filled 2015-03-16: qty 2

## 2015-03-16 MED ORDER — MIDAZOLAM HCL 2 MG/2ML IJ SOLN
INTRAMUSCULAR | Status: DC | PRN
  Administered 2015-03-16: 2 mg via INTRAVENOUS

## 2015-03-16 MED ORDER — ASPIRIN 81 MG PO CHEW
CHEWABLE_TABLET | ORAL | Status: AC
  Filled 2015-03-16: qty 1

## 2015-03-16 MED ORDER — ACETAMINOPHEN 325 MG PO TABS: 650.0000 mg | ORAL_TABLET | ORAL | Status: DC | PRN

## 2015-03-16 MED ORDER — NITROGLYCERIN 1 MG/10 ML FOR IR/CATH LAB
INTRA_ARTERIAL | Status: DC | PRN
  Administered 2015-03-16: 10:00:00

## 2015-03-16 MED ORDER — SODIUM CHLORIDE 0.9 % IV SOLN
INTRAVENOUS | Status: DC
  Administered 2015-03-16: 06:00:00 via INTRAVENOUS

## 2015-03-16 MED ORDER — IOHEXOL 350 MG/ML SOLN
INTRAVENOUS | Status: DC | PRN
  Administered 2015-03-16: 75 mL via INTRACARDIAC

## 2015-03-16 MED ORDER — ASPIRIN 81 MG PO CHEW
81.0000 mg | CHEWABLE_TABLET | ORAL | Status: AC
  Administered 2015-03-16: 81 mg via ORAL

## 2015-03-16 MED ORDER — MIDAZOLAM HCL 2 MG/2ML IJ SOLN
INTRAMUSCULAR | Status: AC
  Filled 2015-03-16: qty 2

## 2015-03-16 MED ORDER — SODIUM CHLORIDE 0.9 % IJ SOLN: 3.0000 mL | INTRAMUSCULAR | Status: DC | PRN

## 2015-03-16 MED ORDER — HEPARIN SODIUM (PORCINE) 1000 UNIT/ML IJ SOLN
INTRAMUSCULAR | Status: DC | PRN
  Administered 2015-03-16: 5000 [IU] via INTRAVENOUS

## 2015-03-16 MED ORDER — POTASSIUM CHLORIDE ER 10 MEQ PO TBCR: 40.0000 meq | EXTENDED_RELEASE_TABLET | Freq: Every day | ORAL | Status: DC

## 2015-03-16 MED ORDER — MORPHINE SULFATE 2 MG/ML IJ SOLN: 1.0000 mg | INTRAMUSCULAR | Status: DC | PRN

## 2015-03-16 MED ORDER — HEPARIN SODIUM (PORCINE) 1000 UNIT/ML IJ SOLN
INTRAMUSCULAR | Status: AC
  Filled 2015-03-16: qty 1

## 2015-03-16 MED ORDER — FENTANYL CITRATE (PF) 100 MCG/2ML IJ SOLN
INTRAMUSCULAR | Status: DC | PRN
  Administered 2015-03-16 (×2): 25 ug via INTRAVENOUS

## 2015-03-16 MED ORDER — RADIAL COCKTAIL (HEPARIN/VERAPAMIL/LIDOCAINE/NITRO)
Status: DC | PRN
  Administered 2015-03-16: 1 via INTRA_ARTERIAL

## 2015-03-16 MED ORDER — LIDOCAINE HCL (PF) 1 % IJ SOLN
INTRAMUSCULAR | Status: AC
  Filled 2015-03-16: qty 30

## 2015-03-16 MED ORDER — VERAPAMIL HCL 2.5 MG/ML IV SOLN
INTRAVENOUS | Status: AC
  Filled 2015-03-16: qty 2

## 2015-03-16 MED ORDER — LOSARTAN POTASSIUM 25 MG PO TABS: 25.0000 mg | ORAL_TABLET | Freq: Every day | ORAL | Status: DC

## 2015-03-16 MED ORDER — SODIUM CHLORIDE 0.9 % IJ SOLN: 3.0000 mL | Freq: Two times a day (BID) | INTRAMUSCULAR | Status: DC

## 2015-03-16 MED ORDER — LIDOCAINE HCL (PF) 1 % IJ SOLN
INTRAMUSCULAR | Status: DC | PRN
  Administered 2015-03-16: 2 mL

## 2015-03-16 MED ORDER — ISOSORBIDE MONONITRATE ER 30 MG PO TB24: 30.0000 mg | ORAL_TABLET | Freq: Every day | ORAL | Status: DC

## 2015-03-16 MED ORDER — POTASSIUM CHLORIDE CRYS ER 20 MEQ PO TBCR
40.0000 meq | EXTENDED_RELEASE_TABLET | ORAL | Status: AC
  Administered 2015-03-16 (×2): 40 meq via ORAL
  Filled 2015-03-16 (×2): qty 2

## 2015-03-16 MED ORDER — SODIUM CHLORIDE 0.9 % WEIGHT BASED INFUSION: 1.0000 mL/kg/h | INTRAVENOUS | Status: DC

## 2015-03-16 MED ORDER — HEPARIN (PORCINE) IN NACL 2-0.9 UNIT/ML-% IJ SOLN
INTRAMUSCULAR | Status: AC
  Filled 2015-03-16: qty 1500

## 2015-03-16 MED ORDER — CARVEDILOL 3.125 MG PO TABS: 3.1250 mg | ORAL_TABLET | Freq: Two times a day (BID) | ORAL | Status: DC

## 2015-03-16 SURGICAL SUPPLY — 10 items
CATH INFINITI 5FR ANG PIGTAIL (CATHETERS) ×1 IMPLANT
CATH OPTITORQUE TIG 4.0 5F (CATHETERS) ×2 IMPLANT
DEVICE RAD COMP TR BAND LRG (VASCULAR PRODUCTS) ×2 IMPLANT
GLIDESHEATH SLEND A-KIT 6F 22G (SHEATH) ×2 IMPLANT
KIT HEART LEFT (KITS) ×2 IMPLANT
PACK CARDIAC CATHETERIZATION (CUSTOM PROCEDURE TRAY) ×2 IMPLANT
SYR MEDRAD MARK V 150ML (SYRINGE) ×2 IMPLANT
TRANSDUCER W/STOPCOCK (MISCELLANEOUS) ×2 IMPLANT
TUBING CIL FLEX 10 FLL-RA (TUBING) ×2 IMPLANT
WIRE SAFE-T 1.5MM-J .035X260CM (WIRE) ×2 IMPLANT

## 2015-03-16 NOTE — H&P (View-Only) (Signed)
PCP: Yong Channel, MD  Clinic Note: Chief Complaint  Patient presents with  . Follow-up     has chest pain today and frequently, frequently shortness of breath, has edema, no pain in legs, has cramping in legs, has lightheadedness, has dizziness  . Pre-op Exam  . Coronary Artery Disease    Status post PCI, now with abnormal stress test    HPI: [name] is a 74 y.o. female with a PMH below who presents today for work in visit after having a Dobutamine Stress Echo performed @ [facility] as part of Pre-operative evaluation for EUS/ERCP -->  The note states that procedure was canceled due to her EKG changed and she had dobutamine echocardiogram. Anesthesia is now requesting her to be cleared. The patient has a baseline ST depressions on her EKG and during her EKG portion of the stress test she had 1 mm downsloping ST segments. She had no chest discomfort, and had no wall motion abnormalities at rest or peak exercise/stress.  [name] was last seen on 06/06/2014 following referral to Dr. Gwenlyn Found and Dr. Trula Slade for evaluation of possible Mesenteric Ischemia.  She also had cardiac catheterization done at the time of her SMA angiography showed patent LAD and RCA stents. She did have a 95% ostial celiac trunk stenosis and mild SMA disease. They felt that the most appropriate course of action with medical management and not to proceed with intervention. Also noted was that she did not tolerate an increase in beta blocker to Toprol 5 mg twice a day.  She was still noting some mild exertional chest discomfort, so we discussed the possibility of adding a nitrate versus Ranexa. She is statin intolerant. Plan is to consider referral to our clinical pharmacist for evaluation of PCSK 9 inhibitor options. When I last saw her, she was on carvedilol 6.25 twice a day, unable to tolerate titration up to 12.5 twice a day. She was also on losartan 100mg  daily, apparently both these medicines have been dropped  somehow.   Studies Reviewed: Dobutamine Stress Echo 03/07/2015 --> (will be scanned), report summarized in [facility].  Interval History: [name] presents today as usual complaining of intermittent chest discomfort either with rest or with exertion as well as exertional dyspnea. Both her husband and daughter are here with her today and described her exertional dyspnea is worse and she indicates. Stating that she can barely walk from one end of the house and the other in the house without gasping for breath. Big concern for her is to read or quadrant discomfort. She was apparently evaluated for some type of the hepatobiliary issues at [facility] and the plan is for her to have an endoscopic ultrasound and ERCP. However during this evaluation they found an abnormal EKG. I unfortunately do not have a copy of this EKG, but apparently it was different and therefore they were concerned about that she needed to the urgent evaluation. Due to her insistence on expediting having her GI procedure, they refer her to have a stress test performed by the cardiology. This was a dobutamine stress echo that was positive an EKG standpoint but normal from imaging standpoint. She is now referred to have definitive evaluation. Unfortunately she does continued to complain of exertional dyspnea and chest discomfort as she is described. She two-pillow orthopnea and chronic intermittent lower extremity edema.   Cardiovascular ROS: positive for - chest pain, dyspnea on exertion, edema, orthopnea and palpitations negative for - irregular heartbeat, loss of consciousness, murmur, palpitations, paroxysmal nocturnal  dyspnea, rapid heart rate or Near syncope, TIA/amaurosis fugax   Past Medical History  Diagnosis Date  . CAD S/P percutaneous coronary angioplasty     a) LHC: 07/30/10. -- 3.0x109mm Integrity BMS to pRCA & 2.5x 37mm Integrity BMS mLAD(@ D2).  b) Class III-IV Angina 03/2011: LHC- ISR in LAD BMS -- prox overlapping Promus DES 2.5x14mm  and PTCA of jailed D2 ostium-prox 80%. c) 02/03/12:  LHC- patent stents.  Jailed diagonal. with stable flow; d) Peri-OP NSTEMI 04/2012 - LHC in 12/'13 -   . Congestive heart failure with LV diastolic dysfunction, NYHA class 2     06/13/10:  last 2D echo-  EF >55%, Mild TR, Mod Conc LVH - Grade 1 diastolic dysfunction (abnormal relaxation) --> LVEDP on Cath 28 mmHg & mild 2nd Pulm HTN  . Renal artery stenosis 2011; 12/'13    a) Angiogram 02/03/12:  50-60%L RA stenosis, 40% R R Inferior artery; b) 12/'13: S/P L RA Stent ([facility]) 6.0 mm x 15 mm; c) Renal Duplex 10/2013: <60% L RA, <60 R RA, ~60% SMA & Celiac A.  . Labile essential hypertension     Partially related to RAS  . Dyslipidemia, goal LDL below 70     Intolerant to statins  . NSTEMI (non-ST elevated myocardial infarction) 04/2012    Unclear the details, apparently this was postoperative from her back surgery that she was cleared for my last saw her in June.  Reportedly had stents placed  . Bilateral edema of lower extremity     Chronic, likely related to venous stasis  . Irritable bowel syndrome (IBS)   . Diverticulitis of colon (without mention of hemorrhage)   . Hepatomegaly   . Pancreas divisum     On pancreatic enzyme  . Calculus of kidney   . Unspecified disorder of thyroid   . Back pain with radiation     Chronic Back Pain - mutliple surgeries (including tumor removal)  . GERD (gastroesophageal reflux disease)   . Hiatal hernia   . Unspecified gastritis and gastroduodenitis without mention of hemorrhage   . Stricture and stenosis of esophagus   . Arthritis   . Blood transfusion without reported diagnosis   . Ulcer   . Mesenteric artery stenosis     95% Celiac Artery - ostial, 20-30% SMA.  Bilateral Renal A: L RA stent patent, R RA 20-30% -- Conservative Management    Past Surgical History  Procedure Laterality Date  . Cholecystectomy    . Minor papilla stent      Dr Pershing Proud  . Knee surgery      left  . Foot  surgery      right and left  . Hip surgery      left  . Breast ductal system excision      right  . Abdominal hysterectomy    . Hand surgery      left  . Spine surgery      tumor removed 07/2010; Redo Surgery 04/2012; Sacroiliac Sgx 08/2013  . Coronary angioplasty with stent placement  07/2010, 02/2011    LHC: 07/30/10.  3.0x10mm Integrity BMS to RCA and 2.5x 68mm Integrity BMS LAD.  03/2011: LHC- ISRS in LAD treated with overlapping Promus DES 2.5x26mm and PTCA of Diagonal.  02/03/12:  LHC- patent stents.  Jailed diagonal.  . Cardiac catheterization  03/2012    ~30% ISR RCA, patent LAD stent & D2; EDP ~28 mmHg  . Cardiovascular stress test  11/07/11:  Normal perfusion pattern.  EF 66% No wall motion abnormalities.   . Renal artery stent Left 08/2012    @ [facility] - 6.0 mm x 15 mm  . Cardiac catheterization  08/2012    Peri-Op MI @ [facility] -- 40% ostial D1, patent LAD stents, 10% RCA ISR  . Colonoscopy  03/01/2009    normal   . Esophagogastroduodenoscopy  04/08/2012  . Cataract extraction    . Left heart catheterization with coronary angiogram N/A 02/03/2012    Procedure: LEFT HEART CATHETERIZATION WITH CORONARY ANGIOGRAM;  Surgeon: Leonie Man, MD;  Location: [facility] CATH LAB;  Service: Cardiovascular;  Laterality: N/A;  . Visceral angiogram N/A 05/12/2014    Procedure: VISCERAL ANGIOGRAM;  Surgeon: Lorretta Harp, MD;  Location[facility] CATH LAB;  Service: Cardiovascular;  Laterality: N/A;  . Left heart catheterization with coronary angiogram N/A 05/12/2014    Procedure: LEFT HEART CATHETERIZATION WITH CORONARY ANGIOGRAM;  Surgeon: Lorretta Harp, MD;  Location: [facility] CATH LAB;  Service: Cardiovascular;  Laterality: N/A;  . Dobutamine stress echo  03/07/2015    [facility] (ordered for pre-op evaluation for EUS/ERCP --> abnormal EKG:  strreesss test shhoowwed 1 mm ST segment depressions downsloping. No wall motion abnormalities at peak exercise or at rest. Diastolic dysfunction was  noted but normal systolic function - EF greater than 55%. No bouts of regurgitation or stenosis. Resting hypertension with exaggerated response    ROS: A comprehensive was performed.pertinent positives noted above Review of Systems  Constitutional: Positive for malaise/fatigue.  HENT: Negative for nosebleeds.   Cardiovascular: Negative for claudication.  Gastrointestinal: Positive for abdominal pain. Negative for blood in stool and melena.       Loose stools  Genitourinary: Negative for hematuria.  Musculoskeletal: Negative for falls.  All other systems reviewed and are negative.   Current Outpatient Prescriptions on File Prior to Visit  Medication Sig Dispense Refill  . aspirin 325 MG tablet Take 325 mg by mouth daily.    . clopidogrel (PLAVIX) 75 MG tablet Take 1 tablet (75 mg total) by mouth daily. 30 tablet 6  . furosemide (LASIX) 40 MG tablet Take 2 tablets by mouth in the morning and 1 tablet in the evening 90 tablet 2  . gabapentin (NEURONTIN) 600 MG tablet Take 1,200 mg by mouth 3 (three) times daily.    Marland Kitchen lidocaine (XYLOCAINE) 5 % ointment Apply 1 application topically daily.    . metolazone (ZAROXOLYN) 2.5 MG tablet Take 2.5 mg by mouth every other day.    . nitroGLYCERIN (NITROSTAT) 0.4 MG SL tablet Place 1 tablet (0.4 mg total) under the tongue every 5 (five) minutes as needed for chest pain. 25 tablet 6  . Pancrelipase, Lip-Prot-Amyl, (ZENPEP) 20000 UNITS CPEP Take 40,000 Units by mouth 3 (three) times daily.    . pantoprazole (PROTONIX) 40 MG tablet Take 1 tablet (40 mg total) by mouth daily. (Patient not taking: Reported on 03/15/2015) 30 tablet 11  . potassium chloride 20 MEQ TBCR Take 40 mEq by mouth daily. 60 tablet 0   No current facility-administered medications on file prior to visit.   Allergies  Allergen Reactions  . Codeine Phosphate Swelling  . Statins     Intolerant   . Tramadol     Kept patient awake  . Iodine Swelling and Rash    REACTION:  unspecified This allergy is to Topical iodine only.   . Prednisone Itching and Rash    History  Social History  . Marital Status: Married    Spouse Name: N/A  . Number of Children: 1  . Years of Education: N/A   Occupational History  . gift shop owner    Social History Main Topics  . Smoking status: Never Smoker   . Smokeless tobacco: Never Used  . Alcohol Use: No  . Drug Use: No  . Sexual Activity: Not on file   Other Topics Concern  . Not on file   Social History Narrative   Married mother of one, one grandchild.   Very socially active, enjoys cooking and having get-togethers her house. She had been exercising regularly but her back pain limits her.   Does not smoke, does not drink alcohol.   Family History  Problem Relation Age of Onset  . Cancer Father     mets  . Heart attack Brother   . Heart attack Brother   . Heart attack Father   . Stroke Mother   . Colitis Maternal Grandfather   . Diabetes Brother   . Colon cancer Neg Hx      Wt Readings from Last 3 Encounters:  03/15/15 99.111 kg (218 lb 8 oz)  01/16/15 98.431 kg (217 lb)  11/09/14 102.059 kg (225 lb)    PHYSICAL EXAM BP 144/72 mmHg  Pulse 79  Ht 5\' 8"  (1.727 m)  Wt 99.111 kg (218 lb 8 oz)  BMI 33.23 kg/m2 General appearance: alert, cooperative, appears stated age, no distress and mildly obese  HEENT: [place]/AT, EOMI, MMM, anicteric sclera  Neck: no adenopathy, soft R carotid bruit, no JVD and supple, symmetrical, trachea midline  Lungs: clear to auscultation bilaterally, normal percussion bilaterally and Nonlabored, good air movement  Heart: RRR, normal S1 and S2 with an S4 gallop. 1/6 SEM at RUSB --> carotids. Nondisplaced PMI. No rubs  Abdomen: soft, non-tender; bowel sounds normal; no masses, no organomegaly  Extremities: no clubbing or cyanosis but with 1+ bilateral edema with varicosities.  Pulses: 2+ and symmetric  Neurologic: Mental status: Alert, oriented, thought content  appropriate  Cranial nerves: normal; normal strength    Adult ECG Report  Rate: 79 ;  Rhythm: normal sinus rhythm,  LVH with nonspecific ST depressions. There are inferior and lateral ST and T-wave depressions with down-sloping T waves that are consistent with previous EKG findings from October 2015.  Narrative Interpretation: stable EKG, cannot exclude ischemia   Other studies Reviewed: Additional studies/ records that were reviewed today include:  Recent Labs:     Chemistry      Component Value Date/Time   NA 133* 01/23/2015 0941   K 3.0* 01/23/2015 0941   CL 90* 01/23/2015 0941   CO2 33* 01/23/2015 0941   BUN 15 01/23/2015 0941   CREATININE 0.75 01/23/2015 0941   CREATININE 0.72 05/06/2014 1022      Component Value Date/Time   CALCIUM 9.9 01/23/2015 0941   ALKPHOS 118* 01/23/2015 0941   AST 16 01/23/2015 0941   ALT 15 01/23/2015 0941   BILITOT 0.7 01/23/2015 0941     No recent lipids available  ASSESSMENT / PLAN: Problem List Items Addressed This Visit    Abnormal electrocardiogram during exercise stress test    Her baseline EKG is abnormal. Unfortunately I guess that I don't have the EKG from [facility]. There was concern that there were some differences concerning for ischemia on the EKG and now she has a stress test with ischemic EKG changes. She has baseline ST depressions or T-wave inversions.  I hope is that the change is not consistent with ischemia however, I think the only definitive way to answer the question is did perform catheterization. During her last cath, she did not have any significant stenoses percent and segments. The lack of wall motion abnormality with peak dobutamine dose at her heart rate would argue that she hopefully would not have a significant lesion. My concern would be what we do if we do find a lesion. She would not want to delay her GI procedure and all the possible this would mean that the stent would probably be the best option.      Relevant  Orders   EKG 12-Lead   LEFT HEART CATHETERIZATION WITH CORONARY ANGIOGRAM   CAD S/P percutaneous coronary angioplasty: pRCA BMS, mLAD BMS overlapped prox with DES for ISR (Chronic)    Unfortunately, she did originally have coronary disease with stenoses leading to two-vessel PCI and had some in-stent restenosis on followup evaluation. She has had several stress tests and cardiac catheterizations since then that have not shown any significant changes. She has chronic exertional dyspnea and chest discomfort which I don't think is macrovascular as it is not much different than it has been before. However now with a "abnormal" stress test we will proceed with diagnostic cardiac catheterization.  She is on aspirin plus Plavix which can be held if we don't find any new stenoses.  Restart beta blocker and ARB. She is statin intolerant in the past. This can be evaluated further following her GI procedure.  We have discussed Imdur versus Ranexa during last visit. Without her being on the beta blocker or ARB, I am reluctant to start either one of these.      Relevant Medications   carvedilol (COREG) 3.125 MG tablet   losartan (COZAAR) 25 MG tablet   Other Relevant Orders   EKG 12-Lead   LEFT HEART CATHETERIZATION WITH CORONARY ANGIOGRAM   Congestive heart failure with LV diastolic dysfunction, NYHA class 2 (Chronic)    Interestingly, her stress echo suggests normal diastolic pressures, but previous catheterizations and echoes have demonstrated diastolic dysfunction. I do think that some of her exertional dyspnea is related to diastolic dysfunction along with deconditioning. She could very well have microvascular ischemia with increased wall stress. For some reason she's no longer on the beta blocker or ARB. Elizabeth to get her back to the original doses, we'll start carvedilol 2.5 twice a day and ARB with losartan at 25 mg daily. Hopefully we can titrate up over the next month or so. She is on standing  dose of Lasix with intermittent Zaroxolyn. We will reassess her LVEDP.      Relevant Medications   carvedilol (COREG) 3.125 MG tablet   losartan (COZAAR) 25 MG tablet   Other Relevant Orders   EKG 12-Lead   LEFT HEART CATHETERIZATION WITH CORONARY ANGIOGRAM   Hx of pancreatitis   Labile essential hypertension (Chronic)    I don't know how her medications were discontinued. Restart beta blocker therapy along with ARB      Relevant Medications   carvedilol (COREG) 3.125 MG tablet   losartan (COZAAR) 25 MG tablet   Other Relevant Orders   EKG 12-Lead   LEFT HEART CATHETERIZATION WITH CORONARY ANGIOGRAM   Obesity (BMI 30.0-34.9) (Chronic)    She is now about 7 pounds down since March.  Probably related to her GI issues. We again talked about the importance of adjusting her diet and trying to pick up her exercise. Hopefully  with a repeat evaluation of her coronary angiography we will be able to more strongly encourage exercise.      Pre-operative cardiovascular examination, new EKG abnormalities c/w ischemia - Primary    This situation with me every very difficult situation to understand the best course of action. I am personally do not have any of the imaging or EKG data the go along with the stress test. Classically warm and consider positive EKG stress test with normal imaging to be a negative stress test. Unfortunately the consultation on this report is that it is positive and abnormal. This is in the setting of fact the patient does have symptoms of chest discomfort, which she considered to be angina and exertional dyspnea. At this point that the only definitive course of action would be to proceed with left heart catheterization with coronary angiography.  I will also restart her beta blocker and ARB that she has not been taking. I think this could explain some exertional dyspnea from diastolic dysfunction.      Relevant Orders   EKG 12-Lead   LEFT HEART CATHETERIZATION WITH  CORONARY ANGIOGRAM   Stasis edema of bilateral lower extremity    Part 2 related to venous stasis. I don't think her diastolic function is significant for culture and edema. His artery on Lasix with when necessary Zaroxolyn.         Current medicines are reviewed at length with the patient today. (+/- concerns) she is not currently taking either her beta blocker or ARB, indicating that her blood pressure was good and she did not think she needed to be on. The following changes have been made: restart carvedilol 3.25 twice a day and losartan 25 mg daily   F/u - ~1 month    HARDING, Leonie Green, M.D., M.S. Interventional Cardiologist   Pager # [phone]

## 2015-03-16 NOTE — Discharge Instructions (Signed)
Radial Site Care °Refer to this sheet in the next few weeks. These instructions provide you with information on caring for yourself after your procedure. Your caregiver may also give you more specific instructions. Your treatment has been planned according to current medical practices, but problems sometimes occur. Call your caregiver if you have any problems or questions after your procedure. °HOME CARE INSTRUCTIONS °· You may shower the day after the procedure. Remove the bandage (dressing) and gently wash the site with plain soap and water. Gently pat the site dry. °· Do not apply powder or lotion to the site. °· Do not submerge the affected site in water for 3 to 5 days. °· Inspect the site at least twice daily. °· Do not flex or bend the affected arm for 24 hours. °· No lifting over 5 pounds (2.3 kg) for 5 days after your procedure. °· Do not drive home if you are discharged the same day of the procedure. Have someone else drive you. °· You may drive 24 hours after the procedure unless otherwise instructed by your caregiver. °· Do not operate machinery or power tools for 24 hours. °· A responsible adult should be with you for the first 24 hours after you arrive home. °What to expect: °· Any bruising will usually fade within 1 to 2 weeks. °· Blood that collects in the tissue (hematoma) may be painful to the touch. It should usually decrease in size and tenderness within 1 to 2 weeks. °SEEK IMMEDIATE MEDICAL CARE IF: °· You have unusual pain at the radial site. °· You have redness, warmth, swelling, or pain at the radial site. °· You have drainage (other than a small amount of blood on the dressing). °· You have chills. °· You have a fever or persistent symptoms for more than 72 hours. °· You have a fever and your symptoms suddenly get worse. °· Your arm becomes pale, cool, tingly, or numb. °· You have heavy bleeding from the site. Hold pressure on the site. °Document Released: 09/21/2010 Document Revised:  11/11/2011 Document Reviewed: 09/21/2010 °[employer]® Patient Information ©2015 [employer]. This information is not intended to replace advice given to you by your health care provider. Make sure you discuss any questions you have with your health care provider. ° °

## 2015-03-16 NOTE — Interval H&P Note (Signed)
History and Physical Interval Note:  03/16/2015 7:37 AM  [name]  has presented today for surgery, with the diagnosis of abnormal stress  The various methods of treatment have been discussed with the patient and family. After consideration of risks, benefits and other options for treatment, the patient has consented to  Procedure(s): Left Heart Cath and Coronary Angiography (N/A) +/-PCI as a surgical intervention .  The patient's history has been reviewed, patient examined, no change in status, stable for surgery.  I have reviewed the patient's chart and labs.  Questions were answered to the patient's satisfaction.     [place], [place]  Cath Lab Visit (complete for each Cath Lab visit)  Clinical Evaluation Leading to the Procedure:   ACS: No.  Non-ACS:    Anginal Classification: CCS II  Anti-ischemic medical therapy: Minimal Therapy (1 class of medications)  Non-Invasive Test Results: Intermediate-risk stress test findings: cardiac mortality 1-3%/year  Prior CABG: No previous CABG  Leonie Man, M.D., M.S. Interventional Cardiologist   Pager # [phone]   Ischemic Symptoms? CCS II (Slight limitation of ordinary activity) Anti-ischemic Medical Therapy? Minimal Therapy (1 class of medications) Non-invasive Test Results? Intermediate-risk stress test findings: cardiac mortality 1-3%/year Prior CABG? No Previous CABG   Patient Information:   1-2V CAD, no prox LAD  U (5)  Indication: 16; Score: 5   Patient Information:   CTO of 1 vessel, no other CAD  U (4)  Indication: 26; Score: 4   Patient Information:   1V CAD with prox LAD  U (6)  Indication: 32; Score: 6   Patient Information:   2V-CAD with prox LAD  A (7)  Indication: 38; Score: 7   Patient Information:   3V-CAD without LMCA  A (7)  Indication: 44; Score: 7   Patient Information:   3V-CAD without LMCA With Abnormal LV systolic function  A (9)  Indication: 48; Score:  9   Patient Information:   LMCA-CAD  A (9)  Indication: 49; Score: 9   Patient Information:   2V-CAD with prox LAD PCI  A (7)  Indication: 62; Score: 7   Patient Information:   2V-CAD with prox LAD CABG  A (8)  Indication: 62; Score: 8   Patient Information:   3V-CAD without LMCA With Low CAD burden(i.e., 3 focal stenoses, low SYNTAX score) PCI  A (7)  Indication: 63; Score: 7   Patient Information:   3V-CAD without LMCA With Low CAD burden(i.e., 3 focal stenoses, low SYNTAX score) CABG  A (9)  Indication: 63; Score: 9   Patient Information:   3V-CAD without LMCA E06c - Intermediate-high CAD burden (i.e., multiple diffuse lesions, presence of CTO, or high SYNTAX score) PCI  U (4)  Indication: 64; Score: 4   Patient Information:   3V-CAD without LMCA E06c - Intermediate-high CAD burden (i.e., multiple diffuse lesions, presence of CTO, or high SYNTAX score) CABG  A (9)  Indication: 64; Score: 9   Patient Information:   LMCA-CAD With Isolated LMCA stenosis  PCI  U (6)  Indication: 65; Score: 6   Patient Information:   LMCA-CAD With Isolated LMCA stenosis  CABG  A (9)  Indication: 65; Score: 9   Patient Information:   LMCA-CAD Additional CAD, low CAD burden (i.e., 1- to 2-vessel additional involvement, low SYNTAX score) PCI  U (5)  Indication: 66; Score: 5   Patient Information:   LMCA-CAD Additional CAD, low CAD burden (i.e., 1- to 2-vessel additional involvement, low SYNTAX  score) CABG  A (9)  Indication: 66; Score: 9   Patient Information:   LMCA-CAD Additional CAD, intermediate-high CAD burden (i.e., 3-vessel involvement, presence of CTO, or high SYNTAX score) PCI  I (3)  Indication: 67; Score: 3   Patient Information:   LMCA-CAD Additional CAD, intermediate-high CAD burden (i.e., 3-vessel involvement, presence of CTO, or high SYNTAX score) CABG  A (9)  Indication: 67; Score: 9   HARDING, DAVID W,  MD

## 2015-03-21 DIAGNOSIS — M2040 Other hammer toe(s) (acquired), unspecified foot: Secondary | ICD-10-CM | POA: Diagnosis not present

## 2015-03-21 DIAGNOSIS — M2010 Hallux valgus (acquired), unspecified foot: Secondary | ICD-10-CM | POA: Diagnosis not present

## 2015-03-21 DIAGNOSIS — L84 Corns and callosities: Secondary | ICD-10-CM | POA: Diagnosis not present

## 2015-03-21 DIAGNOSIS — B351 Tinea unguium: Secondary | ICD-10-CM | POA: Diagnosis not present

## 2015-03-21 DIAGNOSIS — M7671 Peroneal tendinitis, right leg: Secondary | ICD-10-CM | POA: Diagnosis not present

## 2015-03-21 DIAGNOSIS — E1142 Type 2 diabetes mellitus with diabetic polyneuropathy: Secondary | ICD-10-CM | POA: Diagnosis not present

## 2015-03-22 ENCOUNTER — Telehealth: Payer: Self-pay | Admitting: Cardiology

## 2015-03-22 NOTE — Telephone Encounter (Signed)
ROUTED LETTER TO [facility]-- CARDIAC CLEARANCE  LEFT MESSAGE ON  PATIENT'S ANSWER MACHINE.

## 2015-03-22 NOTE — Telephone Encounter (Signed)
Left message to cal back 

## 2015-03-22 NOTE — Telephone Encounter (Signed)
Please call,needs paper faxed to [place] today.She is scheduled for surgery tomorrow.

## 2015-03-22 NOTE — Telephone Encounter (Signed)
PATIENT CALLED NEED CARDIAC CLEARANCE INFORMATION SENT TO [facility] FOR SCHEDULE SURGERY TOMORROW FAX NUMBER [phone]  Attn: Angela PHONE [phone] RN informed patient will contact [place] FROM DR [facility]  RN CALLED AND SPOKE TO ANGELA AT [facility] SCH[facility]ULER.- INFORMATION WILL BE FAXED LATER TODAY

## 2015-03-23 ENCOUNTER — Telehealth: Payer: Self-pay | Admitting: Cardiology

## 2015-03-23 DIAGNOSIS — R932 Abnormal findings on diagnostic imaging of liver and biliary tract: Secondary | ICD-10-CM | POA: Diagnosis not present

## 2015-03-23 DIAGNOSIS — R1013 Epigastric pain: Secondary | ICD-10-CM | POA: Diagnosis not present

## 2015-03-23 DIAGNOSIS — Z886 Allergy status to analgesic agent status: Secondary | ICD-10-CM | POA: Diagnosis not present

## 2015-03-23 DIAGNOSIS — Z6832 Body mass index (BMI) 32.0-32.9, adult: Secondary | ICD-10-CM | POA: Diagnosis not present

## 2015-03-23 DIAGNOSIS — Z9079 Acquired absence of other genital organ(s): Secondary | ICD-10-CM | POA: Diagnosis not present

## 2015-03-23 DIAGNOSIS — Z96652 Presence of left artificial knee joint: Secondary | ICD-10-CM | POA: Diagnosis not present

## 2015-03-23 DIAGNOSIS — Z90710 Acquired absence of both cervix and uterus: Secondary | ICD-10-CM | POA: Diagnosis not present

## 2015-03-23 DIAGNOSIS — Z7902 Long term (current) use of antithrombotics/antiplatelets: Secondary | ICD-10-CM | POA: Diagnosis not present

## 2015-03-23 DIAGNOSIS — K838 Other specified diseases of biliary tract: Secondary | ICD-10-CM | POA: Diagnosis not present

## 2015-03-23 DIAGNOSIS — E039 Hypothyroidism, unspecified: Secondary | ICD-10-CM | POA: Diagnosis not present

## 2015-03-23 DIAGNOSIS — K219 Gastro-esophageal reflux disease without esophagitis: Secondary | ICD-10-CM | POA: Diagnosis not present

## 2015-03-23 DIAGNOSIS — E785 Hyperlipidemia, unspecified: Secondary | ICD-10-CM | POA: Diagnosis not present

## 2015-03-23 DIAGNOSIS — I10 Essential (primary) hypertension: Secondary | ICD-10-CM | POA: Diagnosis not present

## 2015-03-23 DIAGNOSIS — E669 Obesity, unspecified: Secondary | ICD-10-CM | POA: Diagnosis not present

## 2015-03-23 DIAGNOSIS — Z885 Allergy status to narcotic agent status: Secondary | ICD-10-CM | POA: Diagnosis not present

## 2015-03-23 DIAGNOSIS — Z9049 Acquired absence of other specified parts of digestive tract: Secondary | ICD-10-CM | POA: Diagnosis not present

## 2015-03-23 DIAGNOSIS — K868 Other specified diseases of pancreas: Secondary | ICD-10-CM | POA: Diagnosis not present

## 2015-03-23 DIAGNOSIS — K449 Diaphragmatic hernia without obstruction or gangrene: Secondary | ICD-10-CM | POA: Diagnosis not present

## 2015-03-23 DIAGNOSIS — Q453 Other congenital malformations of pancreas and pancreatic duct: Secondary | ICD-10-CM | POA: Diagnosis not present

## 2015-03-23 DIAGNOSIS — I2510 Atherosclerotic heart disease of native coronary artery without angina pectoris: Secondary | ICD-10-CM | POA: Diagnosis not present

## 2015-03-23 DIAGNOSIS — Z79899 Other long term (current) drug therapy: Secondary | ICD-10-CM | POA: Diagnosis not present

## 2015-03-23 DIAGNOSIS — Z7982 Long term (current) use of aspirin: Secondary | ICD-10-CM | POA: Diagnosis not present

## 2015-03-23 DIAGNOSIS — Z888 Allergy status to other drugs, medicaments and biological substances status: Secondary | ICD-10-CM | POA: Diagnosis not present

## 2015-03-23 NOTE — Telephone Encounter (Signed)
LEFT MESSAGE RECEIVED MESSAGE DO NOT HAVE TO CALL UNLESS OFFICE IS NEEDED.

## 2015-03-23 NOTE — Telephone Encounter (Signed)
Per Answering Service: Returning your call from yesterday.

## 2015-03-31 ENCOUNTER — Telehealth: Payer: Self-pay | Admitting: Internal Medicine

## 2015-03-31 NOTE — Telephone Encounter (Signed)
Patient notified She reports "terrible pain still".  She will come in and be seen by Dr. Hilarie Fredrickson 04/04/15

## 2015-03-31 NOTE — Telephone Encounter (Signed)
Left message for patient to call back  

## 2015-03-31 NOTE — Telephone Encounter (Signed)
Please schedule her with APP on a day I am in clinic.  Okay for the week of Aug 15, if not next week Dr. Harl Bowie at [facility] me and let me know he had performed biliary sphincterotomy as her papillary orifice had stenosed.  This check is to see if pain has improved

## 2015-03-31 NOTE — Telephone Encounter (Signed)
Dr. Hilarie Fredrickson - I can't locate the actual ERCP report in care everywhere.  She states they wanted her to follow up with you in 2 weeks.  No openings please advise

## 2015-04-04 ENCOUNTER — Encounter: Payer: Self-pay | Admitting: Internal Medicine

## 2015-04-04 ENCOUNTER — Other Ambulatory Visit (INDEPENDENT_AMBULATORY_CARE_PROVIDER_SITE_OTHER): Payer: Medicare Other

## 2015-04-04 ENCOUNTER — Ambulatory Visit (INDEPENDENT_AMBULATORY_CARE_PROVIDER_SITE_OTHER): Payer: Medicare Other | Admitting: Internal Medicine

## 2015-04-04 VITALS — BP 132/60 | HR 88 | Ht 68.0 in | Wt 224.8 lb

## 2015-04-04 DIAGNOSIS — R1013 Epigastric pain: Secondary | ICD-10-CM

## 2015-04-04 DIAGNOSIS — Z9861 Coronary angioplasty status: Secondary | ICD-10-CM | POA: Diagnosis not present

## 2015-04-04 DIAGNOSIS — R195 Other fecal abnormalities: Secondary | ICD-10-CM | POA: Diagnosis not present

## 2015-04-04 DIAGNOSIS — Q453 Other congenital malformations of pancreas and pancreatic duct: Secondary | ICD-10-CM

## 2015-04-04 DIAGNOSIS — R1011 Right upper quadrant pain: Secondary | ICD-10-CM

## 2015-04-04 DIAGNOSIS — I2510 Atherosclerotic heart disease of native coronary artery without angina pectoris: Secondary | ICD-10-CM | POA: Diagnosis not present

## 2015-04-04 LAB — CBC WITH DIFFERENTIAL/PLATELET
BASOS ABS: 0.1 10*3/uL (ref 0.0–0.1)
Basophils Relative: 0.6 % (ref 0.0–3.0)
EOS ABS: 0.2 10*3/uL (ref 0.0–0.7)
EOS PCT: 2.2 % (ref 0.0–5.0)
HCT: 37.9 % (ref 36.0–46.0)
Hemoglobin: 12.9 g/dL (ref 12.0–15.0)
LYMPHS PCT: 21.6 % (ref 12.0–46.0)
Lymphs Abs: 1.8 10*3/uL (ref 0.7–4.0)
MCHC: 33.9 g/dL (ref 30.0–36.0)
MCV: 84.2 fl (ref 78.0–100.0)
MONO ABS: 1.1 10*3/uL — AB (ref 0.1–1.0)
Monocytes Relative: 13.5 % — ABNORMAL HIGH (ref 3.0–12.0)
NEUTROS PCT: 62.1 % (ref 43.0–77.0)
Neutro Abs: 5.2 10*3/uL (ref 1.4–7.7)
PLATELETS: 264 10*3/uL (ref 150.0–400.0)
RBC: 4.5 Mil/uL (ref 3.87–5.11)
RDW: 14.3 % (ref 11.5–15.5)
WBC: 8.4 10*3/uL (ref 4.0–10.5)

## 2015-04-04 LAB — COMPREHENSIVE METABOLIC PANEL
ALT: 10 U/L (ref 0–35)
AST: 10 U/L (ref 0–37)
Albumin: 4 g/dL (ref 3.5–5.2)
Alkaline Phosphatase: 86 U/L (ref 39–117)
BUN: 14 mg/dL (ref 6–23)
CHLORIDE: 99 meq/L (ref 96–112)
CO2: 34 mEq/L — ABNORMAL HIGH (ref 19–32)
CREATININE: 0.68 mg/dL (ref 0.40–1.20)
Calcium: 9.4 mg/dL (ref 8.4–10.5)
GFR: 90.14 mL/min (ref >60.00)
Glucose, Bld: 107 mg/dL — ABNORMAL HIGH (ref 70–99)
Potassium: 3.7 mEq/L (ref 3.5–5.1)
SODIUM: 139 meq/L (ref 135–145)
TOTAL PROTEIN: 7.3 g/dL (ref 6.0–8.3)
Total Bilirubin: 0.4 mg/dL (ref 0.2–1.2)

## 2015-04-04 LAB — LIPASE: Lipase: 30 U/L (ref 11.0–59.0)

## 2015-04-04 LAB — AMYLASE: AMYLASE: 20 U/L — AB (ref 27–131)

## 2015-04-04 MED ORDER — HYDROCODONE-ACETAMINOPHEN 5-325 MG PO TABS: ORAL_TABLET | ORAL | Status: DC

## 2015-04-04 NOTE — Patient Instructions (Signed)
Your physician has requested that you go to the basement for the following lab work before leaving today: Lipase, CMP, Amylase, CBC  We have given you a prescription for vicodin to take to your pharmacy.  Please continue Zenpep 4 tablets with meals.

## 2015-04-04 NOTE — Progress Notes (Signed)
Subjective:    Patient ID: [name], female    DOB: Jun 14, 1941, 74 y.o.   MRN: [id number]  HPI [name] is a 74 year old female with past medical history of pancreatic divisum with remote pancreatitis, recent ERCP with biliary sphincterotomy performed by Dr. Harl Bowie at [facility] who returns for follow-up. She also has a history of PVD including celiac and renal artery stenosis, gallstones status post cholecystectomy, hypertension, hyperlipidemia, abdominal adhesions, diverticulosis. She takes Plavix chronically for PVD.  Dr. Harl Bowie reported to me after ERCP that there was evidence of early sphincter stenosis and thus recurrent sphincterotomy was performed. The pancreatic ducts were avoided. EUS performed showed no evidence of chronic pancreatitis or PD dilation.  She returns today 12 days after ERCP. Unfortunately she has had no improvement in her epigastric abdominal pain. This continues to be mostly episodic. She feels the pain is slightly worse after the ERCP. Pain begins in the epigastrium and radiates to the right and under her right shoulder blade. Seems to worsen after eating. Associated with nausea but no vomiting. Stools have remained loose but nonbloody and without melena. She was started on Zenpep after her visit in biliary clinic. She's taking 4 tablets with meals and she reports no change in her loose stools.   Review of Systems As per history of present illness, otherwise negative  Current Medications, Allergies, Past Medical History, Past Surgical History, Family History and Social History were reviewed in [employer] record.     Objective:   Physical Exam BP 132/60 mmHg  Pulse 88  Ht 5\' 8"  (1.727 m)  Wt 224 lb 12.8 oz (101.969 kg)  BMI 34.19 kg/m2 Constitutional: Well-developed and well-nourished. No distress. HEENT: Normocephalic and atraumatic. Oropharynx is clear and moist. No oropharyngeal exudate. Conjunctivae are normal.  No scleral  icterus. Neck: Neck supple. Trachea midline. Cardiovascular: Normal rate, regular rhythm and intact distal pulses.  Pulmonary/chest: Effort normal and breath sounds normal. No wheezing, rales or rhonchi. Abdominal: Soft, mild epigastric tenderness without rebound or guarding, nondistended. Bowel sounds active throughout.  Extremities: no clubbing, cyanosis, or edema Lymphadenopathy: No cervical adenopathy noted. Neurological: Alert and oriented to person place and time. Skin: Skin is warm and dry. No rashes noted. Psychiatric: Normal mood and affect. Behavior is normal.  Prior GI relevant studies (taken from my last office note) EGD, 03/29/2014 -- nonerosive gastritis biopsied. Otherwise normal exam, 64 French Maloney dilation performed to treat dysphagia. Pathology = reactive gastropathy, mild. No H. Pylori. Colonoscopy 03/01/2009 -- severe sigmoid diverticulosis, hypertrophied anal papilla, otherwise normal exam exam to the cecum with good prep  CT abdomen and pelvis with contrast 11/08/2014 -- no acute abdominal findings, mass lesions or adenopathy. Stable CBD dilation likely due to prior cholecystectomy. Advanced atherosclerotic calcifications involving the aorta and branch vessels without focal aneurysm. Lower pole right renal calculus. Stable left adrenal adenoma. Extensive surgical changes involving the spine. (Pancreas no splenic (likely intended to be cystic) lesions or acute inflammation).  MRI abdomen with MRCP dated 03/22/2014 -- impression: Although the degree of biliary dilatation could reflect physiologic dilatation in the setting of prior cholecystectomy, there are several atypical features including a blunted appearance of the distal CBD and a questionable faint linear septation in the distal CBD near the ampulla. I do not observe this to be a double CBD, and the apparent septation is very subtle and could instead be due to small vascular structure along the blot termination of the  CBD. I do not see  an obvious filling defect other than this potential focal septation, although sensitivity is reduced by motion artifact. The possibility of a chronic low-grade stricture in the ampulla given a blunted appearance of the CBD is raised. 2. Pancreatic divisum, without dilatation of the dorsal pancreatic duct. 3. Small left adrenal adenoma  CT angiography abdomen and pelvis 04/05/2014 -- Celiac axis: There is high-grade narrowing at the origin of the celiac trunk due to a combination of calcified and noncalcified plaque. There are two main branches of the left gastric artery. The splenic artery and common hepatic artery are patent.  Superior mesenteric: There is calcified plaque along the superior aspect of the SMA causing mild narrowing at the origin of the SMA. The main SMA branches are patent.      Assessment & Plan:  74 year old female with past medical history of pancreatic divisum with remote pancreatitis, recent ERCP with biliary sphincterotomy performed by Dr. Harl Bowie at [facility] who returns for follow-up.   1. Persistent epigastric pain/hx of panc divisum -- unfortunately no response to biliary sphincterotomy. Clinically I do not think she has post-ERCP pancreatitis. I will check liver and pancreatic enzymes today as additional evidence. I will contact Dr. Harl Bowie to discuss any further options including pancreatic sphincterotomy. In the interim will provide Vicodin 1 tablet every 6-8 hours as needed for pain. She is instructed to not drive or operate machinery while taking this medication. She voices understanding. Also advised to avoid Tylenol when using Vicodin as it contains acetaminophen  2. Loose stools -- continue pink-red again some replacement for caps with meals and 2 with snacks  Further recommendations after consultation with Dr. Harl Bowie

## 2015-04-10 ENCOUNTER — Telehealth: Payer: Self-pay | Admitting: Internal Medicine

## 2015-04-10 NOTE — Telephone Encounter (Signed)
Pt called wanting to know if Dr. Hilarie Fredrickson has spoke with her physician at [facility]. Let pt know that Dr. Hilarie Fredrickson is out of town and we will call back next week when he returns. Please advise.

## 2015-04-11 NOTE — Telephone Encounter (Signed)
I did speak with Dr. Harl Bowie regarding her pain He reviewed her [facility] imaging again and we discussed findings from multiple prior studies in our health system There may be a pancreatic option to help with her divisum (Pancreatic duct congential abnormality) Dr. Harl Bowie would prefer sitdown discussion with him and his team 1st before coming straight for a pancreatic procedure (ERCP) which will carry risks in an effort to help her pain His office should be contacted her for an appt.  If she does not hear soon, then she should let me know (i.e. By later next week)

## 2015-04-11 NOTE — Telephone Encounter (Signed)
Spoke with pt and she is aware.

## 2015-04-21 DIAGNOSIS — R1013 Epigastric pain: Secondary | ICD-10-CM | POA: Diagnosis not present

## 2015-05-10 ENCOUNTER — Telehealth: Payer: Self-pay | Admitting: *Deleted

## 2015-05-10 ENCOUNTER — Encounter: Payer: Self-pay | Admitting: *Deleted

## 2015-05-10 DIAGNOSIS — R1013 Epigastric pain: Secondary | ICD-10-CM

## 2015-05-10 DIAGNOSIS — R1011 Right upper quadrant pain: Secondary | ICD-10-CM

## 2015-05-10 DIAGNOSIS — Q453 Other congenital malformations of pancreas and pancreatic duct: Secondary | ICD-10-CM

## 2015-05-10 NOTE — Telephone Encounter (Signed)
Labs in EPIC and secretin stimulated MRCP ordered at [facility]. Per [facility], we must get pre-cert prior to them scheduling the MRCP. We will await precert.

## 2015-05-10 NOTE — Telephone Encounter (Signed)
-----   Message from Jerene Bears, MD sent at 05/10/2015 11:15 AM EDT ----- Regarding: RE: labs and imaging We can try to order, if difficult, then I can ask Dr. Harl Bowie to order Thanks JMP  Indication is hx of pancreatic divisum, persistent epigastric and RUQ pain. Eval for outflow problem in the dorsal pancreatic duct  Thanks JMP  ----- Message -----    From: Larina Bras, CMA    Sent: 05/10/2015  10:44 AM      To: Jerene Bears, MD Subject: RE: labs and imaging                           [place] [facility] does not do secretion stimulated MRCP. Would her dr at [facility] be ordering this or do I need to call to order this?  ----- Message -----    From: Jerene Bears, MD    Sent: 05/09/2015   5:48 PM      To: Larina Bras, CMA Subject: labs and imaging                               Hey pt seen at [facility] by Dr. Harl Bowie He talked to her by phone recently She needs: 1. Hepatic function panel, amylase and lipase 2. Secretin stimulated MRCP (see if this can be done by [facility], and if not needs to be done at [facility]) [facility]

## 2015-05-15 NOTE — Telephone Encounter (Signed)
Patient has been scheduled for secretin stimulated MRCP @ [facility] on Tuesday, 05/23/15 @ 2:15 pm with 2:00 pm arrival. No prep needed. Address: [address] ([place]) Next to [employer] No prior [name] is needed per [employer], Scientist, clinical (histocompatibility and immunogenetics). I have left a voicemail for patient to call back. Orders have been sent to [facility] at [phone].

## 2015-05-16 NOTE — Telephone Encounter (Signed)
Dr. Harl Bowie wanted to see the tests results Best after episode of pain If this does not occur in the next several weeks, would proceed with having them done.

## 2015-05-16 NOTE — Telephone Encounter (Signed)
Patient advised to come for labs AFTER episode of pain. If no pain in the next several weeks, she should have testing done anyways. Patient verbalizes understanding.

## 2015-05-16 NOTE — Telephone Encounter (Signed)
I have advised patient of time/date/location of secretin stimulated MRCP and she verbalizes understanding of this. I have also asked that she come to the lab for hepatic function, amylase and lipase sometime this week. Patient states she was told there would be standing orders placed so she can go to the lab next time she has abdominal pain. Did you mean for me to place standing orders?

## 2015-05-23 DIAGNOSIS — Q453 Other congenital malformations of pancreas and pancreatic duct: Secondary | ICD-10-CM | POA: Diagnosis not present

## 2015-05-23 DIAGNOSIS — R1013 Epigastric pain: Secondary | ICD-10-CM | POA: Diagnosis not present

## 2015-05-23 DIAGNOSIS — R1011 Right upper quadrant pain: Secondary | ICD-10-CM | POA: Diagnosis not present

## 2015-05-23 DIAGNOSIS — Z9049 Acquired absence of other specified parts of digestive tract: Secondary | ICD-10-CM | POA: Diagnosis not present

## 2015-05-27 ENCOUNTER — Other Ambulatory Visit: Payer: Self-pay | Admitting: Cardiology

## 2015-05-29 NOTE — Telephone Encounter (Signed)
Rx request sent to pharmacy.  

## 2015-06-01 ENCOUNTER — Telehealth: Payer: Self-pay | Admitting: Internal Medicine

## 2015-06-01 NOTE — Telephone Encounter (Signed)
Pt had MRCP at [facility] and is calling for results. Please see below and advise.  MRI abdomen secretin w wo contrast w 3D (05/23/2015 3:43 PM) MRI abdomen secretin w wo contrast w 3D (05/23/2015 3:43 PM)  Narrative  Procedure:MRI Abdomen with and without contrast, with Secretin and MRCP    Indication: 17 years y/o Female with chronic epigastric pain. Status post  cholecystectomy and sphincterotomy.    Comparison:Reference only CT from November 08, 2014    Technique: Precontrast and dynamic postcontrast MR imaging of the abdomen  was performed using the Secretin Protocol. IV contrast was administered to  improve disease detection and further define anatomy. MRCP was also  performed, with 3-D reconstruction performed to evaluate biliary anatomy.  16 mcg of Secretin was administered intravenously to allow evaluation of  the exocrine function of the pancreas and for better delineation of the  pancreatic ductal anatomy.    Contrast agent:18 mL of MultiHance administered intravenously at 2  mL/sec. eGFR: Greater than 60 on 05/23/2015    Premedication/adverse events:None.    Findings:    Lung bases are normal in appearance. No pleural or pericardial fluid is  visualized. Diffuse hepatic steatosis. No focal liver lesions. Conventional  hepatic arterial anatomy. Hepatic and portal veins are patent. Gallbladder  is surgically absent. Stable extrahepatic biliary ductal dilation to the  level of the ampulla; the common bile duct measures up to 12 mm in  diameter, similar to prior exam. Scattered foci of susceptibility artifact  in the bile ducts consistent with pneumobilia (expected finding  postsphincterotomy).    Main pancreatic duct is normal in caliber measuring 3 mm. Minimal change in  pancreatic caliber before and after administration of secretin. Minimal  pancreatic fluid is seen to enter the duodenum after secretin  administration. The ventral  pancreatic duct is seen to connect to the  dorsal pancreatic duct; no direct connection from the ventral pancreatic  duct is seen to the major papilla.    Normal appearance of the spleen and right adrenal gland. 1.5 x 1.2 cm  lesion in the left adrenal gland demonstrates signal dropout on out of  phase imaging, consistent with an adrenal adenoma. Kidneys are symmetric in  size and enhancement. There are a few bilateral T2 hyperintense renal  lesions which are too small to accurately characterize. No hydronephrosis.  No enlarged abdominal lymph nodes. Abdominal aorta is patent and normal in  size with moderate atherosclerotic plaque. Celiac artery, SMA, and IMA are  patent. There is narrowing of the superior mesenteric artery at the  takeoff. Inferior vena cava is patent. No dilated loops of bowel visualized  in the imaged portion of the abdomen. Status post thoracolumbar posterior  spinal fusion.    Impression:    1. Abnormal secretin response, suggestive of chronic pancreatitis.     2. The dorsal pancreatic duct drains via the minor papilla. On secretin  images, a diminutive ventral pancreatic duct is seen, but not all the way  to the ampulla. Findings are consistent with dominant dorsal drainage of  the pancreas via the minor papilla.    3. Stable extrahepatic biliary duct dilation status post cholecystectomy.    Electronically Reviewed WP:[email or web address] Jess Barters, MD  Electronically Reviewed on:05/23/2015 4:27 PM    I have reviewed the images and concur with the above findings.    Electronically Signed AS:[email or web address] Zenia Resides, MD  Electronically Signed on:05/23/2015 4:56 PM      MRI abdomen secretin w wo contrast w 3D (05/23/2015 3:43 PM)  Procedure Note  Interface, Rad Results In - 05/23/2015 4:59 PM EDT

## 2015-06-02 NOTE — Telephone Encounter (Signed)
Spoke with pt and she knows Dr. Hilarie Fredrickson will discuss result with Dr. Harl Bowie and then we will call pt with results.

## 2015-06-02 NOTE — Telephone Encounter (Signed)
Left message for pt to call back and report placed in Dr. Vena Rua in box for review.

## 2015-06-02 NOTE — Telephone Encounter (Signed)
I will ask Dr. Harl Bowie to review the MRI and make a recommendation regarding ERP Will let her know when we hear from him Please print report and place on my desk. Thanks

## 2015-06-08 NOTE — Telephone Encounter (Signed)
Dr. Harl Bowie at [facility] has reviewed the secretin stimulated MRCP This does show panc divisum, but the ducts did not overly dilate in response to the secretin He recommends trial of Creon or Zenpep 2 -3 caps with meals to see if this helps symptoms She has tried before, but needs to try again If it doesn't help after reasonable trial (4-6 weeks), then he would consider ERCP for minor sphincterotomy which he would need to discuss with her, i.e. Risks/benefits Please rx panc enzymes 2-3 [facility]

## 2015-06-09 NOTE — Telephone Encounter (Signed)
Left message for patient to call back  

## 2015-06-16 NOTE — Telephone Encounter (Signed)
Left message for pt to call back  °

## 2015-06-19 NOTE — Telephone Encounter (Signed)
Pt aware. States she has been taking zenpep since she was seen at [facility] the 1st time. States she will continue to take it for a while and let us know how she does. States so far she does not think it has helped and it is quite expensive. Pt will call us back and let us know how she is doing.

## 2015-06-21 ENCOUNTER — Other Ambulatory Visit: Payer: Self-pay | Admitting: Cardiology

## 2015-07-11 ENCOUNTER — Ambulatory Visit (INDEPENDENT_AMBULATORY_CARE_PROVIDER_SITE_OTHER): Payer: Medicare Other | Admitting: Internal Medicine

## 2015-07-11 ENCOUNTER — Other Ambulatory Visit (INDEPENDENT_AMBULATORY_CARE_PROVIDER_SITE_OTHER): Payer: Medicare Other

## 2015-07-11 ENCOUNTER — Encounter: Payer: Self-pay | Admitting: Internal Medicine

## 2015-07-11 VITALS — BP 136/64 | HR 80 | Ht 68.0 in | Wt 230.6 lb

## 2015-07-11 DIAGNOSIS — R1013 Epigastric pain: Secondary | ICD-10-CM | POA: Diagnosis not present

## 2015-07-11 DIAGNOSIS — Q453 Other congenital malformations of pancreas and pancreatic duct: Secondary | ICD-10-CM

## 2015-07-11 DIAGNOSIS — G8929 Other chronic pain: Secondary | ICD-10-CM | POA: Diagnosis not present

## 2015-07-11 DIAGNOSIS — Z9861 Coronary angioplasty status: Secondary | ICD-10-CM

## 2015-07-11 DIAGNOSIS — R1011 Right upper quadrant pain: Secondary | ICD-10-CM | POA: Diagnosis not present

## 2015-07-11 DIAGNOSIS — I2510 Atherosclerotic heart disease of native coronary artery without angina pectoris: Secondary | ICD-10-CM

## 2015-07-11 DIAGNOSIS — K861 Other chronic pancreatitis: Secondary | ICD-10-CM | POA: Diagnosis not present

## 2015-07-11 LAB — HEPATIC FUNCTION PANEL
ALBUMIN: 3.7 g/dL (ref 3.5–5.2)
ALT: 9 U/L (ref 0–35)
AST: 11 U/L (ref 0–37)
Alkaline Phosphatase: 90 U/L (ref 39–117)
BILIRUBIN TOTAL: 0.4 mg/dL (ref 0.2–1.2)
Bilirubin, Direct: 0.1 mg/dL (ref 0.0–0.3)
Total Protein: 7 g/dL (ref 6.0–8.3)

## 2015-07-11 LAB — LIPASE: Lipase: 20 U/L (ref 11.0–59.0)

## 2015-07-11 LAB — AMYLASE: AMYLASE: 18 U/L — AB (ref 27–131)

## 2015-07-11 NOTE — Progress Notes (Signed)
Subjective:    Patient ID: [name], female    DOB: [birth year], 74 y.o.   MRN: [id number]  HPI [name] is a 74 year old female with past medical history of pancreas divisum with remote pancreatitis, recent ERCP with biliary sphincterotomy performed by Dr. Harl Bowie at [facility] who returns for follow-up. Since follow-up she had a secretin stimulated MRCP which did show an abnormal secretin response suggestive of chronic pancreatitis showing the dorsal duct drains the dominant portion of the pancreas. Dr. Harl Bowie recommended trial of pancreatic enzyme replacement in follow-up. She has been taking Zenpep before meals and this is not changed symptoms. She continues to have episodic epigastric abdominal pain radiating to the right upper quadrant. This is severe 1 or 2 days per week. Last episode was 2 days ago occurring in the evening after eating. Associated with nausea and vomiting. Bowel movements have had no real change occasionally she is still having loose stools. This is chronic for her. This has not changed despite Zenpep. She has noticed increasing lower extremity edema and this is despite Lasix 80 mg in the morning, 40 mg in the evening and metolazone every other day.   Review of Systems As per history of present illness, otherwise negative  Current Medications, Allergies, Past Medical History, Past Surgical History, Family History and Social History were reviewed in [employer] record.     Objective:   Physical Exam BP 136/64 mmHg  Pulse 80  Ht _0  (1.727 m)  Wt 230 lb 9.6 oz (104.599 kg)  BMI 35.07 kg/m2 Constitutional: Well-developed and well-nourished. No distress. HEENT: Normocephalic and atraumatic. Oropharynx is clear and moist. No oropharyngeal exudate. Conjunctivae are normal.  No scleral icterus. Neck: Neck supple. Trachea midline. Cardiovascular: Normal rate, regular rhythm and intact distal pulses. No M/R/G Pulmonary/chest: Effort normal and  breath sounds normal. No wheezing, rales or rhonchi. Abdominal: Soft, epigastric tenderness without rebound or guarding, nondistended. Bowel sounds active throughout. There are no masses palpable. No hepatosplenomegaly. Extremities: no clubbing, cyanosis, 2+ LE edema Lymphadenopathy: No cervical adenopathy noted. Neurological: Alert and oriented to person place and time. Skin: Skin is warm and dry. No rashes noted. Psychiatric: Normal mood and affect. Behavior is normal.   Procedure: MRI Abdomen with and without contrast, with Secretin and MRCP  Indication: 13 years y/o Female with chronic epigastric pain. Status post cholecystectomy and sphincterotomy.  Comparison: Reference only CT from November 08, 2014  Technique: Precontrast and dynamic postcontrast MR imaging of the abdomen was performed using the Secretin Protocol. IV contrast was administered to improve disease detection and further define anatomy. MRCP was also performed, with 3-D reconstruction performed to evaluate biliary anatomy. 16 mcg of Secretin was administered intravenously to allow evaluation of the exocrine function of the pancreas and for better delineation of the pancreatic ductal anatomy.  Contrast agent: 18 mL of MultiHance administered intravenously at 2 mL/sec. eGFR: Greater than 60 on 05/23/2015  Premedication/adverse events: None.  Findings:  Lung bases are normal in appearance. No pleural or pericardial fluid is visualized. Diffuse hepatic steatosis. No focal liver lesions. Conventional hepatic arterial anatomy. Hepatic and portal veins are patent. Gallbladder is surgically absent. Stable extrahepatic biliary ductal dilation to the level of the ampulla; the common bile duct measures up to 12 mm in diameter, similar to prior exam. Scattered foci of susceptibility artifact in the bile ducts consistent with pneumobilia (expected finding postsphincterotomy).  Main pancreatic duct is normal in caliber  measuring 3 mm. Minimal change in  pancreatic caliber before and after administration of secretin. Minimal pancreatic fluid is seen to enter the duodenum after secretin administration. The ventral pancreatic duct is seen to connect to the dorsal pancreatic duct; no direct connection from the ventral pancreatic duct is seen to the major papilla.  Normal appearance of the spleen and right adrenal gland. 1.5 x 1.2 cm lesion in the left adrenal gland demonstrates signal dropout on out of phase imaging, consistent with an adrenal adenoma. Kidneys are symmetric in size and enhancement. There are a few bilateral T2 hyperintense renal lesions which are too small to accurately characterize. No hydronephrosis. No enlarged abdominal lymph nodes. Abdominal aorta is patent and normal in size with moderate atherosclerotic plaque. Celiac artery, SMA, and IMA are patent. There is narrowing of the superior mesenteric artery at the takeoff. Inferior vena cava is patent. No dilated loops of bowel visualized in the imaged portion of the abdomen. Status post thoracolumbar posterior spinal fusion.  Impression:  1. Abnormal secretin response, suggestive of chronic pancreatitis.   2. The dorsal pancreatic duct drains via the minor papilla. On secretin images, a diminutive ventral pancreatic duct is seen, but not all the way to the ampulla. Findings are consistent with dominant dorsal drainage of the pancreas via the minor papilla.  3. Stable extrahepatic biliary duct dilation status post cholecystectomy.  CMP     Component Value Date/Time   NA 139 04/04/2015 1445   K 3.7 04/04/2015 1445   CL 99 04/04/2015 1445   CO2 34* 04/04/2015 1445   GLUCOSE 107* 04/04/2015 1445   BUN 14 04/04/2015 1445   CREATININE 0.68 04/04/2015 1445   CREATININE 0.72 05/06/2014 1022   CALCIUM 9.4 04/04/2015 1445   PROT 7.3 04/04/2015 1445   ALBUMIN 4.0 04/04/2015 1445   AST 10 04/04/2015 1445   ALT 10 04/04/2015 1445    ALKPHOS 86 04/04/2015 1445   BILITOT 0.4 04/04/2015 1445   GFRNONAA >60 03/16/2015 0609   GFRAA >60 03/16/2015 0609   Lipase     Component Value Date/Time   LIPASE 30.0 04/04/2015 1445    Amylase    Component Value Date/Time   AMYLASE 20* 04/04/2015 1445      Assessment & Plan:  74 year old female with past medical history of pancreas divisum with remote pancreatitis, recent ERCP with biliary sphincterotomy performed by Dr. Harl Bowie at [facility] who returns for follow-up.   1. Persistent and episodic epigastric abdominal pain associated with nausea vomiting/history of pancreatic divisum/chronic pancreatitis -- is difficult to know if she would benefit from a minor sphincterotomy. Dr. Harl Bowie has offered this but wanted her to try pancreatic enzymes first. She has tried this and has had no improvement. We have not however, seen elevation in liver enzymes or lipase. I will recheck these today given her acute attack of pain 48 hours ago. I have explained as has Dr. Harl Bowie how minor sphincterotomy may help but also carries risks including pancreatitis. She states she is willing to except such risks if she could benefit. Of note, she did have prior pancreatic duct stent placed by Dr. Filbert Schilder years ago for very similar pain which improved pain dramatically but over the years the pain returned. With this in mind I will have her see Dr. Harl Bowie again to discuss ERCP with minor sphincterotomy.  If this is unhelpful we will need to treat chronic pancreatitis likely with narcotics. She will continue Zenpep and increased to 2 tablets before meals

## 2015-07-11 NOTE — Patient Instructions (Signed)
Your physician has requested that you go to the basement for the following lab work before leaving today: Amylase, Lipase  We have given you information to look over regarding pancreas divisum.

## 2015-07-14 ENCOUNTER — Other Ambulatory Visit: Payer: Self-pay

## 2015-07-14 ENCOUNTER — Other Ambulatory Visit: Payer: Self-pay | Admitting: Cardiology

## 2015-07-14 MED ORDER — METOLAZONE 2.5 MG PO TABS: 2.5000 mg | ORAL_TABLET | ORAL | Status: DC

## 2015-07-14 NOTE — Telephone Encounter (Signed)
°*  STAT* If patient is at the pharmacy, call can be transferred to refill team.   1. Which medications need to be refilled? (please list name of each medication and dose if known) Metolazone   2. Which pharmacy/location (including street and city if local pharmacy) is medication to be sent to?[facility] in [place]   3. Do they need a 30 day or 90 day supply? [place]

## 2015-07-17 MED ORDER — METOLAZONE 2.5 MG PO TABS: 2.5000 mg | ORAL_TABLET | ORAL | Status: DC

## 2015-07-17 NOTE — Telephone Encounter (Signed)
Pt's medication was sent to the pt's requested pharmacy for a 30 day supply until office visit.

## 2015-07-19 ENCOUNTER — Other Ambulatory Visit: Payer: Self-pay | Admitting: Cardiology

## 2015-07-19 NOTE — Telephone Encounter (Signed)
Rx has been sent to the pharmacy electronically. ° °

## 2015-08-07 DIAGNOSIS — K909 Intestinal malabsorption, unspecified: Secondary | ICD-10-CM | POA: Diagnosis not present

## 2015-08-07 DIAGNOSIS — R197 Diarrhea, unspecified: Secondary | ICD-10-CM | POA: Diagnosis not present

## 2015-08-07 DIAGNOSIS — R1013 Epigastric pain: Secondary | ICD-10-CM | POA: Diagnosis not present

## 2015-08-16 ENCOUNTER — Telehealth: Payer: Self-pay | Admitting: *Deleted

## 2015-08-16 DIAGNOSIS — R109 Unspecified abdominal pain: Secondary | ICD-10-CM

## 2015-08-16 NOTE — Telephone Encounter (Signed)
Dr Hilarie Fredrickson has received correspondence from Dr Harl Bowie @ [facility] from 08-07-15 office visit. Per Dr Hilarie Fredrickson, "Note reviewed. Please send stool for pancreatic fecal elastace." I have spoken to patient and she already has the kit for pancreatic fecal elastace but will bring it to our lab to be processed. She has also scheduled a follow up appointment with Dr Hilarie Fredrickson for 10/05/15.

## 2015-08-20 ENCOUNTER — Other Ambulatory Visit: Payer: Self-pay | Admitting: Cardiology

## 2015-08-21 NOTE — Telephone Encounter (Signed)
REFILL 

## 2015-08-29 ENCOUNTER — Other Ambulatory Visit: Payer: Self-pay | Admitting: Cardiology

## 2015-08-29 NOTE — Telephone Encounter (Signed)
E-sent to pharmacy -30 day supply  No refill  patient is schedule appt 09/10/14

## 2015-09-11 ENCOUNTER — Ambulatory Visit: Payer: Medicare Other | Admitting: Cardiology

## 2015-09-20 ENCOUNTER — Encounter: Payer: Self-pay | Admitting: Cardiology

## 2015-09-20 ENCOUNTER — Ambulatory Visit (INDEPENDENT_AMBULATORY_CARE_PROVIDER_SITE_OTHER): Payer: Medicare Other | Admitting: Cardiology

## 2015-09-20 VITALS — BP 144/66 | HR 84 | Ht 68.0 in | Wt 226.0 lb

## 2015-09-20 DIAGNOSIS — I701 Atherosclerosis of renal artery: Secondary | ICD-10-CM

## 2015-09-20 DIAGNOSIS — E669 Obesity, unspecified: Secondary | ICD-10-CM

## 2015-09-20 DIAGNOSIS — R0609 Other forms of dyspnea: Secondary | ICD-10-CM

## 2015-09-20 DIAGNOSIS — E785 Hyperlipidemia, unspecified: Secondary | ICD-10-CM

## 2015-09-20 DIAGNOSIS — Z9861 Coronary angioplasty status: Secondary | ICD-10-CM

## 2015-09-20 DIAGNOSIS — I2510 Atherosclerotic heart disease of native coronary artery without angina pectoris: Secondary | ICD-10-CM

## 2015-09-20 DIAGNOSIS — I10 Essential (primary) hypertension: Secondary | ICD-10-CM

## 2015-09-20 DIAGNOSIS — E66811 Obesity, class 1: Secondary | ICD-10-CM

## 2015-09-20 DIAGNOSIS — I87302 Chronic venous hypertension (idiopathic) without complications of left lower extremity: Secondary | ICD-10-CM

## 2015-09-20 DIAGNOSIS — I2129 ST elevation (STEMI) myocardial infarction involving other sites: Secondary | ICD-10-CM

## 2015-09-20 DIAGNOSIS — I5030 Unspecified diastolic (congestive) heart failure: Secondary | ICD-10-CM

## 2015-09-20 MED ORDER — METOLAZONE 2.5 MG PO TABS: 2.5000 mg | ORAL_TABLET | Freq: Every day | ORAL | Status: DC | PRN

## 2015-09-20 NOTE — Progress Notes (Signed)
PCP: Yong Channel, MD  Clinic Note: Chief Complaint  Patient presents with  . Chest Pain  . Dizziness  . Shortness of Breath    HPI: [name] is a 75 y.o. female with a PMH below who presents today for 6 month f/u of CAD - did not come in for post cath f/u (not sure what happened with scheduling).  RENO FONG was last seen in July 2016 = for pre-opr evaluation with abnormal Stress Test - & need for re-look Cath. Then went for Endoscopy @ [facility] -- past medical history of pancreatic divisum with remote pancreatitis, recent ERCP with biliary sphincterotomy. --> f/u visit with PA, was told that there was nothing that could be done.  Sees Dr. Hilarie Fredrickson in Jan-Feb time-frame.  Recent Hospitalizations: N/A  Studies Reviewed:  Cardiac Cath July 2016: Conclusion    1. The left ventricular systolic function is normal by echocardiogram. However the LVEDP is elevated to 20 mmHg with normal systolic blood pressure - suggesting diastolic dysfunction 2. Mid LAD to Dist LAD lesion, 5% stenosed. The areas stented with tapping integrity bare-metal stent upstream and Promus DES stent distally jailing the D2. Ostial D2 has minimal stenosis. 3. Prox RCA lesion, 30% stenosed - with evidence of catheter induced spasm. Beyond this the Prox RCA to Mid RCA bare-metal stent has 10% in-stent restenosis. Integrity BMS- Nov 2011 4. Ost 1st Mrg to 1st Mrg lesion, 60% stenosed. Ost 1st Diag to 1st Diag lesion, 60% stenosed.  Widely patent stents in the mid to distal LAD as well as proximal RCA. Jailed diagonal branch also with no significant ostial disease. A small diagonal and OM branch have ostial lesions but otherwise no significant disease.  Despite the stress echo suggestion of normal diastolic pressures. Clearly she has high/elevated LV filling pressures consistent with hypertension. This goes along with a tortuous coronaries    Interval History: Mostly bothered by recurrent Epigastric  pain from pancreatitis. Despite her pain, she is out & about doing whatever she wants.   Still enjoys hosting & cooking.   Hurts as bad sitting as well as standing. Has been having sharp pains in L upper chest -- thinks it may be related to having moved books & furniture during renovations.  Short lived - 10-15 min sharp pain --> once while preparing lunch & once when lying down.  Since then, has continued to be active, working & cleaning without symptoms.   Still has swelling - too hard to put on stockings. 2-3 pillows @ night --> husband notes breathing issues.   Does not think the dyspnea is bad enough to go through Sleep Study for OSA evaluation - does not want to use CPAP.  Does note daytime sleepiness.    Still gets a bit SOB doing house work - but worse walking longer distances. No PND, orthopnea.  Occasional palpitations, lightheadedness, dizziness (orthostatic), but no focal weakness or syncope/near syncope. No TIA/amaurosis fugax symptoms. No melena, hematochezia, hematuria, or epstaxis. No claudication.  ROS: A comprehensive was performed. Review of Systems  Constitutional: Negative for weight loss and malaise/fatigue.  Respiratory: Positive for shortness of breath. Negative for cough.   Cardiovascular: Positive for orthopnea and leg swelling.  Gastrointestinal: Positive for nausea (not as often), abdominal pain and diarrhea (every other day). Negative for blood in stool and melena.  Musculoskeletal: Positive for joint pain. Negative for falls.  Neurological: Positive for dizziness. Negative for weakness.  Endo/Heme/Allergies: Does not bruise/bleed easily.  All other systems  reviewed and are negative.  Had to have kitchen floor re-done - in time for Christmas.  Past Medical History  Diagnosis Date  . CAD S/P percutaneous coronary angioplasty     a) LHC: 07/30/10. -- 3.0x80mm Integrity BMS to pRCA & 2.5x 33mm Integrity BMS mLAD(@ D2).  b) Class III-IV Angina 03/2011: LHC- ISR in  LAD BMS -- prox overlapping Promus DES 2.5x67mm and PTCA of jailed D2 ostium-prox 80%. c) 02/03/12:  LHC- patent stents.  Jailed diagonal. with stable flow; d) Peri-OP NSTEMI 04/2012 - LHC in 12/'13 -   . Congestive heart failure with LV diastolic dysfunction, NYHA class 2 ([place])     06/13/10:  last 2D echo-  EF >55%, Mild TR, Mod Conc LVH - Grade 1 diastolic dysfunction (abnormal relaxation) --> LVEDP on Cath 28 mmHg & mild 2nd Pulm HTN  . Renal artery stenosis ([place]) 2011; 12/'13    a) Angiogram 02/03/12:  50-60%L RA stenosis, 40% R R Inferior artery; b) 12/'13: S/P L RA Stent ([facility]) 6.0 mm x 15 mm; c) Renal Duplex 10/2013: <60% L RA, <60 R RA, ~60% SMA & Celiac A.  . Labile essential hypertension     Partially related to RAS  . Dyslipidemia, goal LDL below 70     Intolerant to statins  . NSTEMI (non-ST elevated myocardial infarction) ([place]) 04/2012    Unclear the details, apparently this was postoperative from her back surgery that she was cleared for my last saw her in June.  Reportedly had stents placed  . Bilateral edema of lower extremity     Chronic, likely related to venous stasis  . Irritable bowel syndrome (IBS)   . Diverticulitis of colon (without mention of hemorrhage)   . Hepatomegaly   . Pancreas divisum     On pancreatic enzyme  . Calculus of kidney   . Unspecified disorder of thyroid   . Back pain with radiation     Chronic Back Pain - mutliple surgeries (including tumor removal)  . GERD (gastroesophageal reflux disease)   . Hiatal hernia   . Unspecified gastritis and gastroduodenitis without mention of hemorrhage   . Stricture and stenosis of esophagus   . Arthritis   . Blood transfusion without reported diagnosis   . Ulcer   . Mesenteric artery stenosis (HCC)     95% Celiac Artery - ostial, 20-30% SMA.  Bilateral Renal A: L RA stent patent, R RA 20-30% -- Conservative Management    Past Surgical History  Procedure Laterality Date  . Cholecystectomy    . Minor  papilla stent      Dr Pershing Proud  . Knee surgery      left  . Foot surgery      right and left  . Hip surgery      left  . Breast ductal system excision      right  . Abdominal hysterectomy    . Hand surgery      left  . Spine surgery      tumor removed 07/2010; Redo Surgery 04/2012; Sacroiliac Sgx 08/2013  . Coronary angioplasty with stent placement  07/2010, 02/2011    LHC: 07/30/10.  3.0x53mm Integrity BMS to RCA and 2.5x 35mm Integrity BMS LAD.  03/2011: LHC- ISRS in LAD treated with overlapping Promus DES 2.5x60mm and PTCA of Diagonal.  02/03/12:  LHC- patent stents.  Jailed diagonal.  . Cardiac catheterization  03/2012    ~30% ISR RCA, patent LAD stent & D2; EDP ~28  mmHg  . Cardiovascular stress test      11/07/11:  Normal perfusion pattern.  EF 66% No wall motion abnormalities.   . Renal artery stent Left 08/2012    @ [facility] - 6.0 mm x 15 mm  . Cardiac catheterization  08/2012    Peri-Op MI @ [facility] -- 40% ostial D1, patent LAD stents, 10% RCA ISR  . Colonoscopy  03/01/2009    normal   . Esophagogastroduodenoscopy  04/08/2012  . Cataract extraction    . Left heart catheterization with coronary angiogram N/A 02/03/2012    Procedure: LEFT HEART CATHETERIZATION WITH CORONARY ANGIOGRAM;  Surgeon: Leonie Man, MD;  Location: [facility] CATH LAB;  Service: Cardiovascular;  Laterality: N/A;  . Visceral angiogram N/A 05/12/2014    Procedure: VISCERAL ANGIOGRAM;  Surgeon: Lorretta Harp, MD;  Location: [facility] CATH LAB;  Service: Cardiovascular;  Laterality: N/A;  . Left heart catheterization with coronary angiogram N/A 05/12/2014    Procedure: LEFT HEART CATHETERIZATION WITH CORONARY ANGIOGRAM;  Surgeon: Lorretta Harp, MD;  Location: [facility] CATH LAB;  Service: Cardiovascular;  Laterality: N/A;  . Dobutamine stress echo  03/07/2015    [facility] (ordered for pre-op evaluation for EUS/ERCP --> abnormal EKG:  strreesss test shhoowwed 1 mm ST segment depressions downsloping. No wall  motion abnormalities at peak exercise or at rest. Diastolic dysfunction was noted but normal systolic function - EF greater than 55%. No bouts of regurgitation or stenosis. Resting hypertension with exaggerated response  . Cardiac catheterization N/A 03/16/2015    Procedure: Left Heart Cath and Coronary Angiography;  Surgeon: Leonie Man, MD;  Location: [facility] CV LAB;  Service: Cardiovascular; Widely patent m-dLAD stents as wellas pRCA stent.  High LVEDP, small Diag & Om vessels with moderate stenosis    Prior to Admission medications   Medication Sig Start Date End Date Taking? Authorizing Provider  acetaminophen (TYLENOL) 500 MG tablet Take 500 mg by mouth every 6 (six) hours as needed for mild pain or moderate pain.   Yes Historical Provider, MD  aspirin 325 MG tablet Take 325 mg by mouth daily.   Yes Historical Provider, MD  carvedilol (COREG) 3.125 MG tablet Take 1 tablet (3.125 mg total) by mouth 2 (two) times daily. 03/15/15  Yes Leonie Man, MD  clopidogrel (PLAVIX) 75 MG tablet TAKE 1 TABLET (75 MG TOTAL) BY MOUTH DAILY. 05/29/15  Yes Leonie Man, MD  furosemide (LASIX) 40 MG tablet TAKE 2 TABLETS IN THE MORNING AND TAKE 1 TABLET IN THE EVENING 07/19/15  Yes Leonie Man, MD  gabapentin (NEURONTIN) 600 MG tablet Take 1,200 mg by mouth 3 (three) times daily.   Yes Historical Provider, MD  HYDROcodone-acetaminophen (NORCO/VICODIN) 5-325 MG per tablet Take 1 tablet by mouth every 6-8 hours as needed for pain 04/04/15  Yes Jerene Bears, MD  isosorbide mononitrate (IMDUR) 30 MG 24 hr tablet Take 1 tablet (30 mg total) by mouth daily. Take with AM meal 03/16/15  Yes Leonie Man, MD  lidocaine (XYLOCAINE) 5 % ointment Apply 1 application topically daily. 02/04/14  Yes Historical Provider, MD  losartan (COZAAR) 25 MG tablet Take 1 tablet (25 mg total) by mouth daily. 03/15/15  Yes Leonie Man, MD  metolazone (ZAROXOLYN) 2.5 MG tablet Take 1 tablet (2.5 mg total) by mouth daily.  KEEP OV. 08/21/15  Yes Leonie Man, MD  metolazone (ZAROXOLYN) 2.5 MG tablet TAKE 1 TABLET (2.5 MG TOTAL) BY  MOUTH EVERY OTHER DAY. 08/29/15  Yes Leonie Man, MD  NITROSTAT 0.4 MG SL tablet PLACE ONE TABLET UNDER THE TONGUE EVERY 5 MINTUES AS NEEDED FOR CHEST PAIN 06/22/15  Yes Leonie Man, MD    Allergies  Allergen Reactions  . Codeine Phosphate Swelling  . Statins     Intolerant   . Tramadol     Kept patient awake  . Iodine Swelling and Rash    REACTION: unspecified This allergy is to Topical iodine only.   . Prednisone Itching and Rash    Social History   Social History  . Marital Status: Married    Spouse Name: N/A  . Number of Children: 1  . Years of Education: N/A   Occupational History  . gift shop owner    Social History Main Topics  . Smoking status: Never Smoker   . Smokeless tobacco: Never Used  . Alcohol Use: No  . Drug Use: No  . Sexual Activity: Not Asked   Other Topics Concern  . None   Social History Narrative   Married mother of one, one grandchild.   Very socially active, enjoys cooking and having get-togethers her house. She had been exercising regularly but her back pain limits her.   Does not smoke, does not drink alcohol.    Family History  Problem Relation Age of Onset  . Cancer Father     mets  . Heart attack Father   . Heart attack Brother   . Heart attack Brother   . Stroke Mother   . Colitis Maternal Grandfather   . Diabetes Brother   . Colon cancer Neg Hx     Wt Readings from Last 3 Encounters:  09/20/15 226 lb (102.513 kg)  07/11/15 230 lb 9.6 oz (104.599 kg)  04/04/15 224 lb 12.8 oz (101.969 kg)    PHYSICAL EXAM BP 144/66 mmHg  Pulse 84  Ht 5\' 8"  (1.727 m)  Wt 226 lb (102.513 kg)  BMI 34.37 kg/m2 General appearance: alert, cooperative, appears stated age, no distress and mildly obese  HEENT: [place]/AT, EOMI, MMM, anicteric sclera  Neck: no adenopathy, soft R carotid bruit, no JVD and supple, symmetrical,  trachea midline  Lungs: clear to auscultation bilaterally, normal percussion bilaterally and Nonlabored, good air movement  Heart: RRR, normal S1 and S2 with an S4 gallop. 1/6 SEM at RUSB --> carotids. Nondisplaced PMI. No rubs  Abdomen: soft, non-tender; bowel sounds normal; no masses, no organomegaly  Extremities: no clubbing or cyanosis but with 1+ bilateral edema with varicosities.  Pulses: 2+ and symmetric  Neurologic: Mental status: Alert, oriented, thought content appropriate  Cranial nerves: normal; normal strength     Adult ECG Report  Rate: 84 ;  Rhythm: normal sinus rhythm, indeterminate and Inferior & Lateral ST-T wave abnormalities - consider ischemia;   Narrative Interpretation: Stable EKG from July 2016    Other studies Reviewed: Additional studies/ records that were reviewed today include:  Recent Labs:  No recent Lipids available -     ASSESSMENT / PLAN: Problem List Items Addressed This Visit    Stasis edema of bilateral lower extremity (Chronic)    Mostly her edema is venous stasis related. Using Lasix with when necessary Zaroxolyn. She's having a hard time wearing compression stockings. I recommended that she look into getting the apparatus that is used bone to assist putting them on. Part of the issue is her back is "not good "and therefore she can't bend over to put them  on. Any assistance with the apparatus would help      Obesity (BMI 30.0-34.9) (Chronic)    Stable weight for now. It looks like she may actually be losing some weight. She is trying to watch what she eats. I think she needs to become more active in exercising. Seems to be more related to her lack of motivation.      MI (myocardial infarction) ([place]) (Chronic)    Apparently postop related. On evaluation at [facility] regional at that time with no culprit lesion. Follow-up Here did not show any significant disease since her last PCI. Was probably related to demand ischemia from hypertension ,  elevated LVEDP and diastolic dysfunction related microvascular ischemia      Relevant Medications   metolazone (ZAROXOLYN) 2.5 MG tablet   Left renal artery stenosis - s/p PTA-Stent 6.0 mm x 15 mm; dopplers show patent LRA. - Primary (Chronic)    Renal arteries were patent by recent cath. She has not had Dopplers done since 2015. We'll relook Dopplers to ensure no worsening stenosis. There is also SMA and celiac artery stenosis noted.      Relevant Medications   metolazone (ZAROXOLYN) 2.5 MG tablet   Other Relevant Orders   EKG 12-Lead (Completed)   VAS US RENAL ARTERY DUPLEX   Labile essential hypertension (Chronic)    She probably does have some component of renal artery stenosis involved. Since she does have evidence of elevated LVEDP on cath, I will think we need to be monitoring her blood pressure well. She says her blood pressure here is actually good for her. If asked the case I think we need to be more aggressive. Plan: She will monitor her pressures at home, and they remain above 140/80, we will increase Cozaar to 50 mg.      Relevant Medications   metolazone (ZAROXOLYN) 2.5 MG tablet   Exertional dyspnea (Chronic)   Relevant Medications   metolazone (ZAROXOLYN) 2.5 MG tablet   Other Relevant Orders   EKG 12-Lead (Completed)   VAS US RENAL ARTERY DUPLEX   Dyslipidemia, goal LDL below 70 - WITH STATIN INTOLERANCE (Chronic)    This time around, I did not talk to her about referral to the pharmacy based lipid clinic. Can discuss and follow-up. Will need to determine just exactly what types of statin medication she has tried.      Relevant Medications   metolazone (ZAROXOLYN) 2.5 MG tablet   Other Relevant Orders   EKG 12-Lead (Completed)   VAS US RENAL ARTERY DUPLEX   Congestive heart failure with LV diastolic dysfunction, NYHA class 2 (HCC) (Chronic)    Cardiac catheterization showed elevated diastolic pressures which goes along with exertional dyspnea. She also is obese  and not very active which adds to her symptoms. She is on a standing dose of Lasix now and has not necessarily had increased. She has had some edema which seems to be more related to venous insufficiency as opposed to heart failure since she doesn't have as much PND or orthopnea. Continue current dose carvedilol. This dose can also be increased for blood pressure control as she still is heart rate. She is on losartan. For now we'll also recommend using when necessary Zaroxolyn more frequently and necessary. Increasing her Lasix doesn't seem to work very effectively for improving urine output.  Encourage daily weight monitoring.       Relevant Medications   metolazone (ZAROXOLYN) 2.5 MG tablet   Other Relevant Orders   EKG 12-Lead (  Completed)   VAS US RENAL ARTERY DUPLEX   CAD S/P percutaneous coronary angioplasty: pRCA BMS, mLAD BMS overlapped prox with DES for ISR (Chronic)    Patent stents by last cath in July of last year. I don't think the chest discomfort she is feeling now is related to angina. Sounds like costochondritis. She is on aspirin and Plavix along with statin, beta blocker and ARB. Does not seem to be having significant active anginal symptoms.      Relevant Medications   metolazone (ZAROXOLYN) 2.5 MG tablet      Current medicines are reviewed at length with the patient today. (+/- concerns) edema The following changes have been made:   Watch blood pressure if continue to remain int 170's and abve contact office. -- will increase Losartan to 50mg   Use metolazone once or twice a week as need for swelling- 30 min prior to morning fluid pill. Your physician has requested that you have a renal artery duplex.   Your physician wants you to follow-up in 6 months with Dr Ellyn Hack.  Studies Ordered:   Orders Placed This Encounter  Procedures  . EKG 12-Lead      Leonie Man, M.D., M.S. Interventional Cardiologist   Pager # [phone] Phone #  [phone] [address] [place], [place] [address]

## 2015-09-20 NOTE — Patient Instructions (Signed)
Your physician has requested that you have a renal artery duplex. During this test, an ultrasound is used to evaluate blood flow to the kidneys. Allow one hour for this exam. Do not eat after midnight the day before and avoid carbonated beverages. Take your medications as you usually do.   Watch blood pressure if continue to remain int 170's and abve cntact office.  Use metolazone once or twice a week as need for swelling- 30 min prior to morning fluid pill.   Your physician wants you to follow-up in 6 months with Dr Ellyn Hack.You will receive a reminder letter in the mail two months in advance. If you don't receive a letter, please call our office to schedule the follow-up appointment.

## 2015-09-23 ENCOUNTER — Encounter: Payer: Self-pay | Admitting: Cardiology

## 2015-09-23 NOTE — Assessment & Plan Note (Signed)
Renal arteries were patent by recent cath. She has not had Dopplers done since 2015. We'll relook Dopplers to ensure no worsening stenosis. There is also SMA and celiac artery stenosis noted.

## 2015-09-23 NOTE — Assessment & Plan Note (Signed)
Patent stents by last cath in July of last year. I don't think the chest discomfort she is feeling now is related to angina. Sounds like costochondritis. She is on aspirin and Plavix along with statin, beta blocker and ARB. Does not seem to be having significant active anginal symptoms.

## 2015-09-23 NOTE — Assessment & Plan Note (Signed)
Stable weight for now. It looks like she may actually be losing some weight. She is trying to watch what she eats. I think she needs to become more active in exercising. Seems to be more related to her lack of motivation.

## 2015-09-23 NOTE — Assessment & Plan Note (Signed)
Apparently postop related. On evaluation at [facility] regional at that time with no culprit lesion. Follow-up Here did not show any significant disease since her last PCI. Was probably related to demand ischemia from hypertension , elevated LVEDP and diastolic dysfunction related microvascular ischemia

## 2015-09-23 NOTE — Assessment & Plan Note (Signed)
This time around, I did not talk to her about referral to the pharmacy based lipid clinic. Can discuss and follow-up. Will need to determine just exactly what types of statin medication she has tried.

## 2015-09-23 NOTE — Assessment & Plan Note (Signed)
She probably does have some component of renal artery stenosis involved. Since she does have evidence of elevated LVEDP on cath, I will think we need to be monitoring her blood pressure well. She says her blood pressure here is actually good for her. If asked the case I think we need to be more aggressive. Plan: She will monitor her pressures at home, and they remain above 140/80, we will increase Cozaar to 50 mg.

## 2015-09-23 NOTE — Assessment & Plan Note (Signed)
Mostly her edema is venous stasis related. Using Lasix with when necessary Zaroxolyn. She's having a hard time wearing compression stockings. I recommended that she look into getting the apparatus that is used bone to assist putting them on. Part of the issue is her back is "not good "and therefore she can't bend over to put them on. Any assistance with the apparatus would help

## 2015-09-23 NOTE — Assessment & Plan Note (Signed)
Cardiac catheterization showed elevated diastolic pressures which goes along with exertional dyspnea. She also is obese and not very active which adds to her symptoms. She is on a standing dose of Lasix now and has not necessarily had increased. She has had some edema which seems to be more related to venous insufficiency as opposed to heart failure since she doesn't have as much PND or orthopnea. Continue current dose carvedilol. This dose can also be increased for blood pressure control as she still is heart rate. She is on losartan. For now we'll also recommend using when necessary Zaroxolyn more frequently and necessary. Increasing her Lasix doesn't seem to work very effectively for improving urine output.  Encourage daily weight monitoring.

## 2015-10-02 ENCOUNTER — Encounter (HOSPITAL_COMMUNITY): Payer: Medicare Other

## 2015-10-04 ENCOUNTER — Ambulatory Visit (HOSPITAL_COMMUNITY)
Admission: RE | Admit: 2015-10-04 | Discharge: 2015-10-04 | Disposition: A | Discharge status: Home / Self Care | Payer: Medicare Other | Source: Ambulatory Visit | Attending: Cardiology | Admitting: Cardiology

## 2015-10-04 DIAGNOSIS — I701 Atherosclerosis of renal artery: Secondary | ICD-10-CM | POA: Diagnosis not present

## 2015-10-04 DIAGNOSIS — R0609 Other forms of dyspnea: Secondary | ICD-10-CM | POA: Insufficient documentation

## 2015-10-04 DIAGNOSIS — E785 Hyperlipidemia, unspecified: Secondary | ICD-10-CM | POA: Insufficient documentation

## 2015-10-04 DIAGNOSIS — I5030 Unspecified diastolic (congestive) heart failure: Secondary | ICD-10-CM | POA: Diagnosis not present

## 2015-10-05 ENCOUNTER — Encounter: Payer: Self-pay | Admitting: Internal Medicine

## 2015-10-05 ENCOUNTER — Ambulatory Visit (INDEPENDENT_AMBULATORY_CARE_PROVIDER_SITE_OTHER): Payer: Medicare Other | Admitting: Internal Medicine

## 2015-10-05 ENCOUNTER — Other Ambulatory Visit: Payer: Self-pay | Admitting: General Surgery

## 2015-10-05 VITALS — BP 130/87 | HR 76 | Ht 68.0 in | Wt 225.2 lb

## 2015-10-05 DIAGNOSIS — I2510 Atherosclerotic heart disease of native coronary artery without angina pectoris: Secondary | ICD-10-CM | POA: Diagnosis not present

## 2015-10-05 DIAGNOSIS — Q453 Other congenital malformations of pancreas and pancreatic duct: Secondary | ICD-10-CM | POA: Diagnosis not present

## 2015-10-05 DIAGNOSIS — G8929 Other chronic pain: Secondary | ICD-10-CM | POA: Diagnosis not present

## 2015-10-05 DIAGNOSIS — N6310 Unspecified lump in the right breast, unspecified quadrant: Secondary | ICD-10-CM

## 2015-10-05 DIAGNOSIS — R1013 Epigastric pain: Secondary | ICD-10-CM

## 2015-10-05 DIAGNOSIS — Z9861 Coronary angioplasty status: Secondary | ICD-10-CM | POA: Diagnosis not present

## 2015-10-05 MED ORDER — AMITRIPTYLINE HCL 25 MG PO TABS: 25.0000 mg | ORAL_TABLET | Freq: Every day | ORAL | Status: DC

## 2015-10-05 NOTE — Progress Notes (Signed)
Subjective:    Patient ID: [name], female    DOB: Feb 01, 1941, 75 y.o.   MRN: [id number]  HPI Sabrian Baddeley is a 75 year old female with a past medical history of pancreas divisum with remote pancreatitis, history of ERCP with biliary sphincterotomy, chronic epigastric and right upper quadrant pain who is here for follow-up. Since her visit she went back to see Dr. Harl Bowie at [facility] to discuss her episodic epigastric right upper quadrant pain radiating to her back. She had a secretin stimulated MRI at [facility]. She had been taking Zenpep without benefit. Dr. Harl Bowie was hesitant to offer minor sphincterotomy given normal labs and relatively normal size of pancreatic ducts. The patient understands this thinking but continues to struggle with episodic abdominal pain. She was to return fecal elastase but has yet to do so. She did stop her Zenpep and found no change in her abdominal pain or loose stools. She often has loose stools after eating and this is a chronic long-standing problem. She does fine her abdominal pain to be frustrating and at times limiting to her activity. In the past she's tried cyclobenzaprine for this pain which helped only minimally. She had a paradoxical reaction to tramadol which caused extreme hyperactivity. She was given Vicodin for back surgery but doesn't currently use this. She reports a good appetite no nausea or vomiting. Some nausea only with attacks. No blood in her stool or melena.   Review of Systems As per history of present illness, otherwise negative  Current Medications, Allergies, Past Medical History, Past Surgical History, Family History and Social History were reviewed in [employer] record.     Objective:   Physical Exam BP 130/87 mmHg  Pulse 76  Ht 5\' 8"  (1.727 m)  Wt 225 lb 4 oz (102.173 kg)  BMI 34.26 kg/m2 Constitutional: Well-developed and well-nourished. No distress. HEENT: Normocephalic and atraumatic.  Conjunctivae are  normal.  No scleral icterus. Cardiovascular: Normal rate, regular rhythm and intact distal pulses. No M/R/G Pulmonary/chest: Effort normal and breath sounds normal. No wheezing, rales or rhonchi. Abdominal: Soft, nontender, nondistended. Bowel sounds active throughout.  Extremities: no clubbing, cyanosis, trace to 1+ LE edema to mid shin Neurological: Alert and oriented to person place and time. Skin: Skin is warm and dry. Psychiatric: Normal mood and affect. Behavior is normal.  CMP     Component Value Date/Time   NA 139 04/04/2015 1445   K 3.7 04/04/2015 1445   CL 99 04/04/2015 1445   CO2 34* 04/04/2015 1445   GLUCOSE 107* 04/04/2015 1445   BUN 14 04/04/2015 1445   CREATININE 0.68 04/04/2015 1445   CREATININE 0.72 05/06/2014 1022   CALCIUM 9.4 04/04/2015 1445   PROT 7.0 07/11/2015 1039   ALBUMIN 3.7 07/11/2015 1039   AST 11 07/11/2015 1039   ALT 9 07/11/2015 1039   ALKPHOS 90 07/11/2015 1039   BILITOT 0.4 07/11/2015 1039   GFRNONAA >60 03/16/2015 0609   GFRAA >60 03/16/2015 0609    Lipase     Component Value Date/Time   LIPASE 20.0 07/11/2015 1039       Assessment & Plan:  75 year old female with a past medical history of pancreas divisum with remote pancreatitis, history of ERCP with biliary sphincterotomy, chronic epigastric and right upper quadrant pain who is here for follow-up.  1. Chronic and episodic epigastric and right upper quadrant abdominal pain/pancreas divisum -- she is had thorough evaluation at [facility] with Dr. Harl Bowie and we have never been able to  document elevated liver enzymes or lipase. Dr. Harl Bowie does not feel minor pancreatic sphincterotomy worth the risk. I will certainly defer to his expertise. After thorough evaluation will move to treating pain going for to try to lessen symptoms. I will have her submit pancreatic fecal elastase for completeness. I doubt she really needs pancreatic enzymes. There is been no weight loss or evidence of  malnutrition. --Trial of amitriptyline 25 mg daily at bedtime for chronic abdominal pain. Future option if no benefit would be escalation of amitriptyline and if no benefit possibly Cymbalta. She states adamantly she does not want anything will "messed with my mind"  Follow-up in 3-4 months, sooner if necessary 25 minutes spent with the patient today. Greater than 50% was spent in counseling and coordination of care with the patient

## 2015-10-05 NOTE — Patient Instructions (Addendum)
Please follow up with Dr Hilarie Fredrickson in 3-4 months.  We have sent the following medications to your pharmacy for you to pick up at your convenience: Amitriptyline 25 mg every evening.  We have sent the following medications to your pharmacy for you to pick up at your convenience: Fecal elastace  Please discontinue Zenpep and Vicodin.

## 2015-10-10 ENCOUNTER — Ambulatory Visit
Admission: RE | Admit: 2015-10-10 | Discharge: 2015-10-10 | Disposition: A | Discharge status: Home / Self Care | Payer: Medicare Other | Source: Ambulatory Visit | Attending: General Surgery | Admitting: General Surgery

## 2015-10-10 DIAGNOSIS — R928 Other abnormal and inconclusive findings on diagnostic imaging of breast: Secondary | ICD-10-CM | POA: Diagnosis not present

## 2015-10-10 DIAGNOSIS — N6489 Other specified disorders of breast: Secondary | ICD-10-CM | POA: Diagnosis not present

## 2015-10-11 ENCOUNTER — Telehealth: Payer: Self-pay | Admitting: *Deleted

## 2015-10-11 NOTE — Telephone Encounter (Signed)
Spoke to patient. Result given . Verbalized understanding FAXED RESULT TO DR Karle Starch

## 2015-10-11 NOTE — Telephone Encounter (Signed)
-----   Message from Leonie Man, MD sent at 10/10/2015  6:39 PM EST ----- Technically challenging study: Essentially stable. Normal caliber abdominal aorta. >50% distal aorta stenosis. >70% celiac and SMA stenosis. Normal bilateral kidney size, with a decrease in size of both kidneys when compared to prior exam. Normal bilateral renal arteries, s/p left renal artery stent.  The IVC and renal veins are patent.  Pls forward to [employer]. Karle Starch, MD  f/u 1 year.

## 2015-11-13 ENCOUNTER — Other Ambulatory Visit: Payer: Self-pay | Admitting: Cardiology

## 2015-11-13 NOTE — Telephone Encounter (Signed)
REFILL 

## 2015-11-16 DIAGNOSIS — B351 Tinea unguium: Secondary | ICD-10-CM | POA: Diagnosis not present

## 2015-11-16 DIAGNOSIS — Z794 Long term (current) use of insulin: Secondary | ICD-10-CM | POA: Diagnosis not present

## 2015-11-16 DIAGNOSIS — M2041 Other hammer toe(s) (acquired), right foot: Secondary | ICD-10-CM | POA: Diagnosis not present

## 2015-11-16 DIAGNOSIS — L97512 Non-pressure chronic ulcer of other part of right foot with fat layer exposed: Secondary | ICD-10-CM | POA: Insufficient documentation

## 2015-11-16 DIAGNOSIS — S99921A Unspecified injury of right foot, initial encounter: Secondary | ICD-10-CM | POA: Diagnosis not present

## 2015-11-16 DIAGNOSIS — E1140 Type 2 diabetes mellitus with diabetic neuropathy, unspecified: Secondary | ICD-10-CM | POA: Diagnosis not present

## 2015-11-26 ENCOUNTER — Other Ambulatory Visit: Payer: Self-pay | Admitting: Cardiology

## 2015-12-11 DIAGNOSIS — Z961 Presence of intraocular lens: Secondary | ICD-10-CM | POA: Diagnosis not present

## 2015-12-11 DIAGNOSIS — H43813 Vitreous degeneration, bilateral: Secondary | ICD-10-CM | POA: Diagnosis not present

## 2015-12-11 DIAGNOSIS — H16223 Keratoconjunctivitis sicca, not specified as Sjogren's, bilateral: Secondary | ICD-10-CM | POA: Diagnosis not present

## 2015-12-11 DIAGNOSIS — E119 Type 2 diabetes mellitus without complications: Secondary | ICD-10-CM | POA: Diagnosis not present

## 2015-12-11 DIAGNOSIS — H5203 Hypermetropia, bilateral: Secondary | ICD-10-CM | POA: Diagnosis not present

## 2016-01-08 ENCOUNTER — Ambulatory Visit
Admission: RE | Admit: 2016-01-08 | Discharge: 2016-01-08 | Disposition: A | Discharge status: Home / Self Care | Payer: Medicare Other | Source: Ambulatory Visit | Attending: General Surgery | Admitting: General Surgery

## 2016-01-08 ENCOUNTER — Other Ambulatory Visit: Payer: Self-pay | Admitting: General Surgery

## 2016-01-08 DIAGNOSIS — N6310 Unspecified lump in the right breast, unspecified quadrant: Secondary | ICD-10-CM

## 2016-01-08 DIAGNOSIS — N6489 Other specified disorders of breast: Secondary | ICD-10-CM | POA: Diagnosis not present

## 2016-01-08 DIAGNOSIS — R928 Other abnormal and inconclusive findings on diagnostic imaging of breast: Secondary | ICD-10-CM | POA: Diagnosis not present

## 2016-01-09 DIAGNOSIS — L821 Other seborrheic keratosis: Secondary | ICD-10-CM | POA: Diagnosis not present

## 2016-01-18 ENCOUNTER — Telehealth: Payer: Self-pay | Admitting: Cardiology

## 2016-01-18 ENCOUNTER — Other Ambulatory Visit: Payer: Self-pay | Admitting: *Deleted

## 2016-01-18 MED ORDER — ISOSORBIDE MONONITRATE ER 30 MG PO TB24: 30.0000 mg | ORAL_TABLET | Freq: Every day | ORAL | Status: DC

## 2016-01-18 MED ORDER — CLOPIDOGREL BISULFATE 75 MG PO TABS: 75.0000 mg | ORAL_TABLET | Freq: Every day | ORAL | Status: DC

## 2016-01-18 NOTE — Telephone Encounter (Signed)
Spoke to patient  she states she needs plavix refilled it had been refilled previous Doctor was filling. Informed patient  Prescription was filled earlier today. Patient states she also needs refilled isosorbide. She states her pharmacy said she picked medication on 01/07/16/ she states can not find medication.  she states she will have to pay out of pocket the months difference. RN called [facility]. THE COST WILL BE $9   PATIENT STATES [facility] WILL BE ABLE AFFORD MEDICATION. Will pick up medication

## 2016-01-18 NOTE — Telephone Encounter (Signed)
  New Message  Pt requested to speak w/ RN concerning pt isosorbide and plavix. Please call back and discuss.

## 2016-01-18 NOTE — Telephone Encounter (Signed)
Rx request sent to pharmacy.  

## 2016-01-19 MED FILL — ISOSORBIDE MN ER 30 MG TAB: 30 | 30 days supply | Qty: 30 | Fill #0

## 2016-04-24 ENCOUNTER — Other Ambulatory Visit: Payer: Self-pay | Admitting: Cardiology

## 2016-04-25 ENCOUNTER — Other Ambulatory Visit: Payer: Self-pay | Admitting: Cardiology

## 2016-04-25 NOTE — Telephone Encounter (Signed)
REFILL 

## 2016-05-01 ENCOUNTER — Encounter: Payer: Self-pay | Admitting: Cardiology

## 2016-05-01 ENCOUNTER — Ambulatory Visit (INDEPENDENT_AMBULATORY_CARE_PROVIDER_SITE_OTHER): Payer: Medicare Other | Admitting: Cardiology

## 2016-05-01 VITALS — BP 156/76 | HR 73 | Ht 68.0 in | Wt 232.4 lb

## 2016-05-01 DIAGNOSIS — I2109 ST elevation (STEMI) myocardial infarction involving other coronary artery of anterior wall: Secondary | ICD-10-CM

## 2016-05-01 DIAGNOSIS — I701 Atherosclerosis of renal artery: Secondary | ICD-10-CM

## 2016-05-01 DIAGNOSIS — R002 Palpitations: Secondary | ICD-10-CM | POA: Diagnosis not present

## 2016-05-01 DIAGNOSIS — Z9861 Coronary angioplasty status: Secondary | ICD-10-CM

## 2016-05-01 DIAGNOSIS — I2510 Atherosclerotic heart disease of native coronary artery without angina pectoris: Secondary | ICD-10-CM

## 2016-05-01 DIAGNOSIS — E785 Hyperlipidemia, unspecified: Secondary | ICD-10-CM | POA: Diagnosis not present

## 2016-05-01 DIAGNOSIS — I87302 Chronic venous hypertension (idiopathic) without complications of left lower extremity: Secondary | ICD-10-CM

## 2016-05-01 DIAGNOSIS — R0609 Other forms of dyspnea: Secondary | ICD-10-CM

## 2016-05-01 DIAGNOSIS — I10 Essential (primary) hypertension: Secondary | ICD-10-CM

## 2016-05-01 DIAGNOSIS — E669 Obesity, unspecified: Secondary | ICD-10-CM

## 2016-05-01 DIAGNOSIS — I5030 Unspecified diastolic (congestive) heart failure: Secondary | ICD-10-CM

## 2016-05-01 DIAGNOSIS — R9431 Abnormal electrocardiogram [ECG] [EKG]: Secondary | ICD-10-CM

## 2016-05-01 MED ORDER — LOSARTAN POTASSIUM 50 MG PO TABS: 50.0000 mg | ORAL_TABLET | Freq: Every day | ORAL | 3 refills | Status: DC

## 2016-05-01 MED ORDER — METOLAZONE 2.5 MG PO TABS
2.5000 mg | ORAL_TABLET | Freq: Every day | ORAL | 3 refills | Status: DC | PRN
Start: 2016-05-01 — End: 2016-10-17

## 2016-05-01 NOTE — Patient Instructions (Addendum)
INCREASE LOSARTAN TO 50 MG ONE TABLET DAILY   LABS - NOTHING TO EAT OR DRINK THE MORNING OF THE TEST--CMP , LIPID Follow up with lipid clinic  NO OTHER CHANGES  Your physician wants you to follow-up in: 6 MONTHS WITH DR HARDING--30 MIN You will receive a reminder letter in the mail two months in advance. If you don't receive a letter, please call our office to schedule the follow-up appointment.   If you need a refill on your cardiac medications before your next appointment, please call your pharmacy.

## 2016-05-01 NOTE — Progress Notes (Signed)
PCP: Yong Channel, MD  Clinic Note: Chief Complaint  Patient presents with  . Follow-up    sob when exerting self. Pt states having sharp chest pain this past sunday night. Pt has not had any chest pain after first episode. sharp leg cramps in legs. edema; in feet has not improved since last OV.  Marland Kitchen Coronary Artery Disease    Chronic noncardiac chest pain  . Edema  . Hypertension    HPI: CECIA DYMENT is a 75 y.o. female with a PMH below who presents today for 6 month f/u of CAD - did not come in for post cath f/u (not sure what happened with scheduling). uly 2016 = for pre-opr evaluation with abnormal Stress Test - & need for re-look Cath.  MACKENZY TWADDLE was last seen in Jan 2017. We started using this when necessary Zaroxolyn and did support stockings which she had a hard time Putting them on. Sees Dr. Hilarie Fredrickson for GI - decided that she is tired of going to [facility]  Recent Hospitalizations: N/A  Studies Reviewed:  Renal artery Dopplers February 2017: Stable. 50% distal aortic stenosis. 70% celiac and SMA stenosis. Normal bilateral kidney size. Status post left renal artery stent. Patent.  Interval History: Chrishell presents today doing okay overall from a cardiac standpoint. She still has occasional chest discomfort that may happen with or without exertion. She still has her swelling that is off and on, but she is controlling it pretty well with her Lasix and the Zaroxolyn. She wondered if she can take Zaroxolyn more frequently. She still having a hard time putting on her support hose.  She denies any significant resting or exertional chest pain symptoms. Less short of breath with doing her routine health work and walking. Still sleeps with 2 pillows @ night --> husband notes breathing issues.   Does not think the dyspnea is bad enough to go through Sleep Study for OSA evaluation - does not want to use CPAP.  Does note daytime sleepiness.    No PND, orthopnea.  Occasional palpitations,  lightheadedness, dizziness (orthostatic), but no focal weakness or syncope/near syncope. No TIA/amaurosis fugax symptoms. No melena, hematochezia, hematuria, or epstaxis. No claudication.  ROS: A comprehensive was performed. Review of Systems  Constitutional: Negative for malaise/fatigue and weight loss.  Respiratory: Positive for shortness of breath. Negative for cough.   Cardiovascular: Positive for orthopnea and leg swelling.  Gastrointestinal: Positive for abdominal pain, diarrhea (every other day) and nausea (not as often). Negative for blood in stool and melena.  Musculoskeletal: Positive for joint pain. Negative for falls.  Neurological: Positive for dizziness. Negative for weakness.  Endo/Heme/Allergies: Does not bruise/bleed easily.  All other systems reviewed and are negative.   Past Medical History:  Diagnosis Date  . Arthritis   . Back pain with radiation    Chronic Back Pain - mutliple surgeries (including tumor removal)  . Bilateral edema of lower extremity    Chronic, likely related to venous stasis  . Blood transfusion without reported diagnosis   . CAD S/P percutaneous coronary angioplasty    a) LHC: 07/30/10. -- 3.0x27mm Integrity BMS to pRCA & 2.5x 73mm Integrity BMS mLAD(@ D2).  b) Class III-IV Angina 03/2011: LHC- ISR in LAD BMS -- prox overlapping Promus DES 2.5x7mm and PTCA of jailed D2 ostium-prox 80%. c) 02/03/12:  LHC- patent stents.  Jailed diagonal. with stable flow; d) Peri-OP NSTEMI 04/2012 - LHC in 12/'13 -   . Calculus of kidney   . Congestive  heart failure with LV diastolic dysfunction, NYHA class 2 ([place])    06/13/10:  last 2D echo-  EF >55%, Mild TR, Mod Conc LVH - Grade 1 diastolic dysfunction (abnormal relaxation) --> LVEDP on Cath 28 mmHg & mild 2nd Pulm HTN  . Diverticulitis of colon (without mention of hemorrhage)   . Dyslipidemia, goal LDL below 70    Intolerant to statins  . GERD (gastroesophageal reflux disease)   . Hepatomegaly   . Hiatal  hernia   . Irritable bowel syndrome (IBS)   . Labile essential hypertension    Partially related to RAS  . Mesenteric artery stenosis (HCC)    95% Celiac Artery - ostial, 20-30% SMA.  Bilateral Renal A: L RA stent patent, R RA 20-30% -- Conservative Management  . NSTEMI (non-ST elevated myocardial infarction) ([place]) 04/2012   Unclear the details, apparently this was postoperative from her back surgery that she was cleared for my last saw her in June.  Reportedly had stents placed  . Pancreas divisum    On pancreatic enzyme  . Renal artery stenosis ([facility]) 2011; 12/'13   a) Angiogram 02/03/12:  50-60%L RA stenosis, 40% R R Inferior artery; b) 12/'13: S/P L RA Stent ([facility]) 6.0 mm x 15 mm; c) Renal Duplex 10/2013: <60% L RA, <60 R RA, ~60% SMA & Celiac A.  . Stricture and stenosis of esophagus   . Ulcer   . Unspecified disorder of thyroid   . Unspecified gastritis and gastroduodenitis without mention of hemorrhage     Past Surgical History:  Procedure Laterality Date  . ABDOMINAL HYSTERECTOMY    . BREAST DUCTAL SYSTEM EXCISION     right  . CARDIAC CATHETERIZATION N/A 03/16/2015   Procedure: Left Heart Cath and Coronary Angiography;  Surgeon: Leonie Man, MD;  Location: [facility];  Service: Cardiovascular; Widely patent m-dLAD stents as wellas pRCA stent.  High LVEDP, small Diag & Om vessels with moderate stenosis   . CARDIOVASCULAR STRESS TEST     11/07/11:  Normal perfusion pattern.  EF 66% No wall motion abnormalities.   Marland Kitchen CATARACT EXTRACTION    . CHOLECYSTECTOMY    . COLONOSCOPY  03/01/2009   normal   . CORONARY ANGIOPLASTY WITH STENT PLACEMENT  07/2010, 02/2011   LHC: 07/30/10.  3.0x62mm Integrity BMS to RCA and 2.5x 45mm Integrity BMS LAD.  03/2011: LHC- ISRS in LAD treated with overlapping Promus DES 2.5x76mm and PTCA of Diagonal.  02/03/12:  LHC- patent stents.  Jailed diagonal.  . DOBUTAMINE STRESS ECHO  03/07/2015   [facility] (ordered for pre-op evaluation for EUS/ERCP -->  abnormal EKG:  strreesss test shhoowwed 1 mm ST segment depressions downsloping. No wall motion abnormalities at peak exercise or at rest. Diastolic dysfunction was noted but normal systolic function - EF greater than 55%. No bouts of regurgitation or stenosis. Resting hypertension with exaggerated response  . ESOPHAGOGASTRODUODENOSCOPY  04/08/2012  . FOOT SURGERY     right and left  . HAND SURGERY     left  . HIP SURGERY     left  . KNEE SURGERY     left  . LEFT HEART CATHETERIZATION WITH CORONARY ANGIOGRAM N/A 02/03/2012   Procedure: LEFT HEART CATHETERIZATION WITH CORONARY ANGIOGRAM;  Surgeon: Leonie Man, MD;  Location[facility] C[facility] LAB;  Service: Cardiovascular;  Laterality: N/A;  . LEFT HEART CATHETERIZATION WITH CORONARY ANGIOGRAM N/A 05/12/2014   Procedure: LEFT HEART CATHETERIZATION WITH CORONARY ANGIOGRAM;  Surgeon: Lorretta Harp, MD;  Location: [facility] CATH LAB;  Service: Cardiovascular: Stable CAD. Patent stents. Patent renal artery stent  . LEFT HEART CATHETERIZATION WITH CORONARY ANGIOGRAM  08/2012   Peri-Op MI @ [facility] -- 40% ostial D1, patent LAD stents, 10% RCA ISR  . LEFT HEART CATHETERIZATION WITH CORONARY ANGIOGRAM   03/2012   ~30% ISR RCA, patent LAD stent & D2; EDP ~28 mmHg  . minor papilla stent     Dr Pershing Proud  . RENAL ARTERY STENT Left 08/2012   @ [facility] - 6.0 mm x 15 mm  . SPINE SURGERY     tumor removed 07/2010; Redo Surgery 04/2012; Sacroiliac Sgx 08/2013  . VISCERAL ANGIOGRAM N/A 05/12/2014   Procedure: VISCERAL ANGIOGRAM;  Surgeon: Lorretta Harp, MD;  Location: [facility] CATH LAB;  Service: Cardiovascular;  Laterality: N/A;    Prior to Admission medications   Medication Sig Start Date End Date Taking? Authorizing Provider  acetaminophen (TYLENOL) 500 MG tablet Take 500 mg by mouth every 6 (six) hours as needed for mild pain or moderate pain.   Yes Historical Provider, MD  aspirin 325 MG tablet Take 325 mg by mouth daily.   Yes Historical  Provider, MD  carvedilol (COREG) 3.125 MG tablet Take 1 tablet (3.125 mg total) by mouth 2 (two) times daily. 03/15/15  Yes Leonie Man, MD  clopidogrel (PLAVIX) 75 MG tablet TAKE 1 TABLET (75 MG TOTAL) BY MOUTH DAILY. 05/29/15  Yes Leonie Man, MD  furosemide (LASIX) 40 MG tablet TAKE 2 TABLETS IN THE MORNING AND TAKE 1 TABLET IN THE EVENING 07/19/15  Yes Leonie Man, MD  gabapentin (NEURONTIN) 600 MG tablet Take 1,200 mg by mouth 3 (three) times daily.   Yes Historical Provider, MD  HYDROcodone-acetaminophen (NORCO/VICODIN) 5-325 MG per tablet Take 1 tablet by mouth every 6-8 hours as needed for pain 04/04/15  Yes Jerene Bears, MD  isosorbide mononitrate (IMDUR) 30 MG 24 hr tablet Take 1 tablet (30 mg total) by mouth daily. Take with AM meal 03/16/15  Yes Leonie Man, MD  lidocaine (XYLOCAINE) 5 % ointment Apply 1 application topically daily. 02/04/14  Yes Historical Provider, MD  losartan (COZAAR) 25 MG tablet Take 1 tablet (25 mg total) by mouth daily. 03/15/15  Yes Leonie Man, MD  metolazone (ZAROXOLYN) 2.5 MG tablet Take 1 tablet (2.5 mg total) by mouth daily. KEEP OV. 08/21/15  Yes Leonie Man, MD  metolazone (ZAROXOLYN) 2.5 MG tablet TAKE 1 TABLET (2.5 MG TOTAL) BY MOUTH EVERY OTHER DAY. 08/29/15  Yes Leonie Man, MD  NITROSTAT 0.4 MG SL tablet PLACE ONE TABLET UNDER THE TONGUE EVERY 5 MINTUES AS NEEDED FOR CHEST PAIN 06/22/15  Yes Leonie Man, MD    Allergies  Allergen Reactions  . Codeine Phosphate Swelling  . Statins     Intolerant   . Tramadol     Kept patient awake  . Iodine Swelling and Rash    REACTION: unspecified This allergy is to Topical iodine only.   . Prednisone Itching and Rash    Social History   Social History  . Marital status: Married    Spouse name: N/A  . Number of children: 1  . Years of education: N/A   Occupational History  . gift shop owner    Social History Main Topics  . Smoking status: Never Smoker  . Smokeless  tobacco: Never Used  . Alcohol use No  . Drug use: No  .  Sexual activity: Not Asked   Other Topics Concern  . None   Social History Narrative   Married mother of one, one grandchild.   Very socially active, enjoys cooking and having get-togethers her house. She had been exercising regularly but her back pain limits her.   Does not smoke, does not drink alcohol.    Family History  Problem Relation Age of Onset  . Cancer Father     mets  . Heart attack Father   . Stroke Mother   . Heart attack Brother   . Heart attack Brother   . Colitis Maternal Grandfather   . Diabetes Brother   . Stroke Brother   . Heart attack Brother   . Colon cancer Neg Hx     Wt Readings from Last 3 Encounters:  05/01/16 232 lb 6.4 oz (105.4 kg)  10/05/15 225 lb 4 oz (102.2 kg)  09/20/15 226 lb (102.5 kg)    PHYSICAL EXAM BP (!) 156/76   Pulse 73   Ht 5\' 8"  (1.727 m)   Wt 232 lb 6.4 oz (105.4 kg)   BMI 35.34 kg/m  General appearance: alert, cooperative, appears stated age, no distress and mildly obese  HEENT: [place]/AT, EOMI, MMM, anicteric sclera  Neck: no adenopathy, soft R carotid bruit, no JVD and supple, symmetrical, trachea midline  Lungs: clear to auscultation bilaterally, normal percussion bilaterally and Nonlabored, good air movement  Heart: RRR, normal S1 and S2 with an S4 gallop. 1/6 SEM at RUSB --> carotids. Nondisplaced PMI. No rubs  Abdomen: soft, non-tender; bowel sounds normal; no masses, no organomegaly  Extremities: no clubbing or cyanosis but with 1+ bilateral edema with varicosities.  Pulses: 2+ and symmetric  Neurologic: Mental status: Alert, oriented, thought content appropriate  Cranial nerves: normal; normal strength     Adult ECG Report  Rate: 73;  Rhythm: normal sinus rhythm, indeterminate and Inferior & Lateral ST-T wave abnormalities - consider ischemia;   Narrative Interpretation: Stable EKG from July 2016    Other studies Reviewed: Additional studies/  records that were reviewed today include:  Recent Labs:  No recent Lipids available -     ASSESSMENT / PLAN: Problem List Items Addressed This Visit    Stasis edema of bilateral lower extremity (Chronic)    Most of her symptoms are probably more related to venous stasis and diastolic heart failure. Continue Lasix and and when necessary Zaroxolyn. I recommended compression stockings again, and explained how to put them on. Hopefully that will help. She will look into getting the support stockings are not quite as tight.      Palpitation (Chronic)   Relevant Orders   EKG 12-Lead (Completed)   Lipid panel   Comprehensive metabolic panel   Obesity (BMI 30.0-34.9) (Chronic)    Her weight is back up now after having lost some weight. She is somewhat frustrated. I think she really has lack of motivation to adjust her diet and exercise.      MI (myocardial infarction) ([place]) (Chronic)    Reportedly had a postop MI, but did not have stents placed. Did not have any significant wall motion abnormality on echo.       Relevant Medications   losartan (COZAAR) 50 MG tablet   metolazone (ZAROXOLYN) 2.5 MG tablet   Left renal artery stenosis - s/p PTA-Stent 6.0 mm x 15 mm; dopplers show patent LRA. (Chronic)    Recent Dopplers showed stable results. She does have [place] celiac artery stenosis as well as  aortic stenosis. We will continue to follow.      Relevant Medications   losartan (COZAAR) 50 MG tablet   metolazone (ZAROXOLYN) 2.5 MG tablet   Labile essential hypertension (Chronic)    Likely partially related to renal artery stenosis. Now fixed with stents. Plan is to increase Cozaar to 50 mg. This was the intention last time, but she never did.      Relevant Medications   losartan (COZAAR) 50 MG tablet   metolazone (ZAROXOLYN) 2.5 MG tablet   Other Relevant Orders   EKG 12-Lead (Completed)   Lipid panel   Comprehensive metabolic panel   Exertional dyspnea (Chronic)   Relevant Orders     EKG 12-Lead (Completed)   Lipid panel   Comprehensive metabolic panel   Dyslipidemia, goal LDL below 70 - WITH STATIN INTOLERANCE (Chronic)    Has had symptoms of statins, and has had multiple family members and friends with issues having taken statins. She will not take a statin, we have tried other medications but she is reluctant to take anything. Plans check lipid panel now and referred to orthopedic clinic. The hope would be that she qualify for PCSK9 inhibitor.      Relevant Medications   losartan (COZAAR) 50 MG tablet   metolazone (ZAROXOLYN) 2.5 MG tablet   Other Relevant Orders   EKG 12-Lead (Completed)   Lipid panel   Comprehensive metabolic panel   Congestive heart failure with LV diastolic dysfunction, NYHA class 2 (HCC) (Chronic)    She does have elevated diastolic pressures by cath and has exertional dyspnea. She is on beta blocker and ARB which I would increase the dose of ARB now. She is taking Lasix. I'm fine with her using additional doses of Zaroxolyn for when necessary. We also have room to increase carvedilol, however she got fatigued with me increased before.      Relevant Medications   losartan (COZAAR) 50 MG tablet   metolazone (ZAROXOLYN) 2.5 MG tablet   CAD S/P percutaneous coronary angioplasty: pRCA BMS, mLAD BMS overlapped prox with DES for ISR - Primary (Chronic)    Really doing pretty well from a anginal standpoint. She has some noncardiac sounding chest pain that sounds like costochondritis. Catheterization last year showed widely patent stents. Remains on Plavix - can stop aspirin. On low-dose carvedilol. On Imdur and losartan.  Refuses to take statins.      Relevant Medications   losartan (COZAAR) 50 MG tablet   metolazone (ZAROXOLYN) 2.5 MG tablet   Other Relevant Orders   EKG 12-Lead (Completed)   Lipid panel   Comprehensive metabolic panel    Other Visit Diagnoses   None.     Current medicines are reviewed at length with the patient  today. (+/- concerns) edema The following changes have been made:   INCREASE LOSARTAN TO 50 MG ONE TABLET DAILY   LABS - NOTHING TO EAT OR DRINK THE MORNING OF THE TEST--CMP , LIPID Follow up with lipid clinic  NO OTHER CHANGES  Your physician wants you to follow-up in: 6 MONTHS WITH DR HARDING--30 MIN   Studies Ordered:   Orders Placed This Encounter  Procedures  . Lipid panel  . Comprehensive metabolic panel  . EKG 12-Lead     Glenetta Hew, M.D., M.S. Interventional Cardiologist   Pager # [phone] Phone # [phone] [address] [place], [place] [address]

## 2016-05-03 ENCOUNTER — Encounter: Payer: Self-pay | Admitting: Cardiology

## 2016-05-03 NOTE — Assessment & Plan Note (Addendum)
Really doing pretty well from a anginal standpoint. She has some noncardiac sounding chest pain that sounds like costochondritis. Catheterization last year showed widely patent stents. Remains on Plavix - can stop aspirin. On low-dose carvedilol. On Imdur and losartan.  Refuses to take statins.

## 2016-05-03 NOTE — Assessment & Plan Note (Signed)
Reportedly had a postop MI, but did not have stents placed. Did not have any significant wall motion abnormality on echo.

## 2016-05-03 NOTE — Assessment & Plan Note (Signed)
Most of her symptoms are probably more related to venous stasis and diastolic heart failure. Continue Lasix and and when necessary Zaroxolyn. I recommended compression stockings again, and explained how to put them on. Hopefully that will help. She will look into getting the support stockings are not quite as tight.

## 2016-05-03 NOTE — Assessment & Plan Note (Signed)
Likely partially related to renal artery stenosis. Now fixed with stents. Plan is to increase Cozaar to 50 mg. This was the intention last time, but she never did.

## 2016-05-03 NOTE — Assessment & Plan Note (Signed)
Recent Dopplers showed stable results. She does have [place] celiac artery stenosis as well as aortic stenosis. We will continue to follow.

## 2016-05-03 NOTE — Assessment & Plan Note (Signed)
Her weight is back up now after having lost some weight. She is somewhat frustrated. I think she really has lack of motivation to adjust her diet and exercise.

## 2016-05-03 NOTE — Assessment & Plan Note (Signed)
She does have elevated diastolic pressures by cath and has exertional dyspnea. She is on beta blocker and ARB which I would increase the dose of ARB now. She is taking Lasix. I'm fine with her using additional doses of Zaroxolyn for when necessary. We also have room to increase carvedilol, however she got fatigued with me increased before.

## 2016-05-03 NOTE — Assessment & Plan Note (Signed)
Has had symptoms of statins, and has had multiple family members and friends with issues having taken statins. She will not take a statin, we have tried other medications but she is reluctant to take anything. Plans check lipid panel now and referred to orthopedic clinic. The hope would be that she qualify for PCSK9 inhibitor.

## 2016-05-07 ENCOUNTER — Ambulatory Visit: Payer: Medicare Other

## 2016-05-07 DIAGNOSIS — I2510 Atherosclerotic heart disease of native coronary artery without angina pectoris: Secondary | ICD-10-CM | POA: Diagnosis not present

## 2016-05-07 DIAGNOSIS — Z9861 Coronary angioplasty status: Secondary | ICD-10-CM | POA: Diagnosis not present

## 2016-05-07 DIAGNOSIS — I10 Essential (primary) hypertension: Secondary | ICD-10-CM | POA: Diagnosis not present

## 2016-05-07 DIAGNOSIS — E785 Hyperlipidemia, unspecified: Secondary | ICD-10-CM | POA: Diagnosis not present

## 2016-05-07 DIAGNOSIS — R0609 Other forms of dyspnea: Secondary | ICD-10-CM | POA: Diagnosis not present

## 2016-05-07 DIAGNOSIS — R002 Palpitations: Secondary | ICD-10-CM | POA: Diagnosis not present

## 2016-05-08 LAB — COMPREHENSIVE METABOLIC PANEL
ALBUMIN: 3.7 g/dL (ref 3.6–5.1)
ALT: 11 U/L (ref 6–29)
AST: 13 U/L (ref 10–35)
Alkaline Phosphatase: 88 U/L (ref 33–130)
BILIRUBIN TOTAL: 0.5 mg/dL (ref 0.2–1.2)
BUN: 13 mg/dL (ref 7–25)
CALCIUM: 9.1 mg/dL (ref 8.6–10.4)
CHLORIDE: 104 mmol/L (ref 98–110)
CO2: 27 mmol/L (ref 20–31)
Creat: 0.66 mg/dL (ref 0.60–0.93)
GLUCOSE: 118 mg/dL — AB (ref 65–99)
POTASSIUM: 4.2 mmol/L (ref 3.5–5.3)
Sodium: 139 mmol/L (ref 135–146)
Total Protein: 6.9 g/dL (ref 6.1–8.1)

## 2016-05-08 LAB — LIPID PANEL
CHOL/HDL RATIO: 6.4 ratio — AB (ref <=5.0)
CHOLESTEROL: 219 mg/dL — AB (ref 125–200)
HDL: 34 mg/dL — AB (ref >=46)
LDL Cholesterol: 124 mg/dL (ref <130)
Triglycerides: 303 mg/dL — ABNORMAL HIGH (ref <150)
VLDL: 61 mg/dL — AB (ref <30)

## 2016-05-09 ENCOUNTER — Ambulatory Visit (INDEPENDENT_AMBULATORY_CARE_PROVIDER_SITE_OTHER): Payer: Medicare Other | Admitting: Pharmacist Clinician (PhC)/ Clinical Pharmacy Specialist

## 2016-05-09 DIAGNOSIS — E785 Hyperlipidemia, unspecified: Secondary | ICD-10-CM | POA: Diagnosis not present

## 2016-05-09 DIAGNOSIS — I2510 Atherosclerotic heart disease of native coronary artery without angina pectoris: Secondary | ICD-10-CM

## 2016-05-09 DIAGNOSIS — Z9861 Coronary angioplasty status: Secondary | ICD-10-CM | POA: Diagnosis not present

## 2016-05-09 MED ORDER — OMEGA-3-ACID ETHYL ESTERS 1 G PO CAPS: 2.0000 g | ORAL_CAPSULE | Freq: Two times a day (BID) | ORAL | 5 refills | Status: DC

## 2016-05-09 NOTE — Assessment & Plan Note (Signed)
Patient is apparently not intolerant to statins, but refuses to try based on side effects noticed by family and friends.  LDL elevated at 124 and triglycerides at 303.  Patient agreeable to try generic Lovaza for lowering triglycerides.  I think if she does well with this, we can repeat labs in 2-3 months and then potentially start her on ezetimibe for LDL lowering.  She will continue to work on dietary options, although she cannot eat almonds due to her pancreatic issues, we did discuss adding oatmeal and she was given information on dietary choices.

## 2016-05-09 NOTE — Patient Instructions (Signed)
Start Lovaza (omega 3 fish oil) 1 capsule twice daily.  If you don't have any problems then after 1-2 weeks increase to 2 capsules twice daily.    Repeat labs in 10-12 weeks   Cholesterol Cholesterol is a fat. Your body needs a small amount of cholesterol. Cholesterol may build up in your blood vessels. This increases your chance of having a heart attack or stroke. You cannot feel your cholesterol levels. The only way to know your cholesterol level is high is with a blood test. Keep your test results. Work with your doctor to keep your cholesterol at a good level. WHAT DO THE TEST RESULTS MEAN?  Total cholesterol is how much cholesterol is in your blood.  LDL is bad cholesterol. This is the type that can build up. You want LDL to be low.  HDL is good cholesterol. It cleans your blood vessels and carries LDL away. You want HDL to be high.  Triglycerides are fat that the body can burn for energy or store. WHAT ARE GOOD LEVELS OF CHOLESTEROL?  Total cholesterol below 200.  LDL below 100 for people at risk. Below 70 for those at very high risk.  HDL above 50 is good. Above 60 is best.  Triglycerides below 150. HOW CAN I LOWER MY CHOLESTEROL?  Diet. Follow your diet programs as told by your doctor.  Choose fish, white meat chicken, roasted [place], or baked [place]. Try not to eat red meat, fried foods, or processed meats such as sausage and lunch meats.  Eat lots of fresh fruits and vegetables.  Choose whole grains, beans, pasta, potatoes, and cereals.  Use only small amounts of olive, corn, or canola oils.  Try not to eat butter, mayonnaise, shortening, or palm kernel oils.  Try not to eat foods with trans fats.  Drink skim or nonfat milk. Eat low-fat or nonfat yogurt and cheeses. Try not to drink whole milk or cream. Try not to eat ice cream, egg yolks, and full-fat cheeses.  Healthy desserts include angel food cake, ginger snaps, animal crackers, hard candy, popsicles, and  low-fat or nonfat frozen yogurt. Try not to eat pastries, cakes, pies, and cookies.  Exercise. Follow your exercise programs as told by your doctor.  Be more active. You can try gardening, walking, or taking the stairs. Ask your doctor about how you can be more active.  Medicine. Take medicine as told by your doctor.   This information is not intended to replace advice given to you by your health care provider. Make sure you discuss any questions you have with your health care provider.   Document Released: 11/15/2008 Document Revised: 09/09/2014 Document Reviewed: 06/02/2013 Elsevier Interactive Patient Education [employer].

## 2016-05-09 NOTE — Progress Notes (Signed)
05/09/2016 [name] 12/09/1940 [id number]   HPI:  [name] is a 75 y.o. female patient of [facility], who presents today for a lipid clinic evaluation.  [name] has never taken a statin drug, but does not wish to even attempt at this time.  She has a history of enlarged liver, as well as a first cousin who had a liver transplant (although she does not know reason behind transplant).  She also reports that her brother took a statin and had such severe leg pain that he was unable to walk for a short time.  She is hoping to lower her cholesterol with weight loss and better eating habits.    Current Medications: none  Risk Factors: CAD sp PCI (stents x 3), RAS (left, with stent)  Cholesterol Goals:  LDL < 70  Intolerant/previously tried: None  Family history:  Father had MI at 37, mother stroke at 45  Social History: No tobacco or alcohol  Diet:  Has cut all desserts from her diet over past year, only drinks unsweetened drinks; no sodas.  Admits that her biggest problem is she eats breads, as she bakes fresh bread weekly.    Exercise:   None, admits to getting DOE easily, but would like to clean off her treadmill and see if she can slowly build up some stamina  Labs:  05/2016 - TC 219, TG 303, HDL 34, LDL 124   Current Outpatient Prescriptions  Medication Sig Dispense Refill  . acetaminophen (TYLENOL) 500 MG tablet Take 500 mg by mouth every 6 (six) hours as needed for mild pain or moderate pain.    Marland Kitchen amitriptyline (ELAVIL) 25 MG tablet Take 1 tablet (25 mg total) by mouth at bedtime. 30 tablet 2  . aspirin 325 MG tablet Take 325 mg by mouth daily.    . carvedilol (COREG) 3.125 MG tablet TAKE ONE TABLET BY MOUTH TWICE DAILY 60 tablet 5  . clopidogrel (PLAVIX) 75 MG tablet Take 1 tablet (75 mg total) by mouth daily. 30 tablet 6  . furosemide (LASIX) 40 MG tablet TAKE TWO TABLETS BY MOUTH IN THE MORNING AND TAKE ONE TABLET IN THE EVENING 90 tablet 0  . gabapentin  (NEURONTIN) 600 MG tablet Take 1,200 mg by mouth 3 (three) times daily.    . isosorbide mononitrate (IMDUR) 30 MG 24 hr tablet Take 1 tablet (30 mg total) by mouth every morning. KEEP OV. 90 tablet 0  . lidocaine (XYLOCAINE) 5 % ointment Apply 1 application topically daily.    Marland Kitchen losartan (COZAAR) 50 MG tablet Take 1 tablet (50 mg total) by mouth daily. 90 tablet 3  . metolazone (ZAROXOLYN) 2.5 MG tablet Take 1 tablet (2.5 mg total) by mouth daily as needed. 90 tablet 3  . NITROSTAT 0.4 MG SL tablet PLACE ONE TABLET UNDER THE TONGUE EVERY 5 MINTUES AS NEEDED FOR CHEST PAIN 25 tablet 11   No current facility-administered medications for this visit.     Allergies  Allergen Reactions  . Codeine Phosphate Swelling  . Statins     Intolerant   . Tramadol     Kept patient awake  . Iodine Swelling and Rash    REACTION: unspecified This allergy is to Topical iodine only.   . Prednisone Itching and Rash    Past Medical History:  Diagnosis Date  . Arthritis   . Back pain with radiation    Chronic Back Pain - mutliple surgeries (including tumor removal)  . Bilateral edema of lower  extremity    Chronic, likely related to venous stasis  . Blood transfusion without reported diagnosis   . CAD S/P percutaneous coronary angioplasty    a) LHC: 07/30/10. -- 3.0x46mm Integrity BMS to pRCA & 2.5x 62mm Integrity BMS mLAD(@ D2).  b) Class III-IV Angina 03/2011: LHC- ISR in LAD BMS -- prox overlapping Promus DES 2.5x14mm and PTCA of jailed D2 ostium-prox 80%. c) 02/03/12:  LHC- patent stents.  Jailed diagonal. with stable flow; d) Peri-OP NSTEMI 04/2012 - LHC in 12/'13 -   . Calculus of kidney   . Congestive heart failure with LV diastolic dysfunction, NYHA class 2 ([place])    06/13/10:  last 2D echo-  EF >55%, Mild TR, Mod Conc LVH - Grade 1 diastolic dysfunction (abnormal relaxation) --> LVEDP on Cath 28 mmHg & mild 2nd Pulm HTN  . Diverticulitis of colon (without mention of hemorrhage)   . Dyslipidemia, goal  LDL below 70    Intolerant to statins  . GERD (gastroesophageal reflux disease)   . Hepatomegaly   . Hiatal hernia   . Irritable bowel syndrome (IBS)   . Labile essential hypertension    Partially related to RAS  . Mesenteric artery stenosis (HCC)    95% Celiac Artery - ostial, 20-30% SMA.  Bilateral Renal A: L RA stent patent, R RA 20-30% -- Conservative Management  . NSTEMI (non-ST elevated myocardial infarction) ([place]) 04/2012   Unclear the details, apparently this was postoperative from her back surgery that she was cleared for my last saw her in June.  Reportedly had stents placed  . Pancreas divisum    On pancreatic enzyme  . Renal artery stenosis ([place]) 2011; 12/'13   a) Angiogram 02/03/12:  50-60%L RA stenosis, 40% R R Inferior artery; b) 12/'13: S/P L RA Stent ([facility]) 6.0 mm x 15 mm; c) Renal Duplex 10/2013: <60% L RA, <60 R RA, ~60% SMA & Celiac A.  . Stricture and stenosis of esophagus   . Ulcer   . Unspecified disorder of thyroid   . Unspecified gastritis and gastroduodenitis without mention of hemorrhage    Assessment/Plan: Patient is apparently not intolerant to statins, but refuses to try based on side effects noticed by family and friends.  LDL elevated at 124 and triglycerides at 303.  Patient agreeable to try generic Lovaza for lowering triglycerides.  I think if she does well with this, we can repeat labs in 2-3 months and then potentially start her on ezetimibe for LDL lowering.  She will continue to work on dietary options, although she cannot eat almonds due to her pancreatic issues, we did discuss adding oatmeal and she was given information on dietary choices.    Tommy Medal PharmD CPP [facility]

## 2016-05-10 ENCOUNTER — Ambulatory Visit: Payer: Medicare Other

## 2016-05-21 ENCOUNTER — Other Ambulatory Visit: Payer: Self-pay | Admitting: Cardiology

## 2016-05-21 NOTE — Telephone Encounter (Signed)
Rx has been sent to the pharmacy electronically. ° °

## 2016-05-22 NOTE — Telephone Encounter (Signed)
Rx(s) sent to pharmacy electronically.  

## 2016-05-23 ENCOUNTER — Other Ambulatory Visit: Payer: Self-pay | Admitting: Internal Medicine

## 2016-07-05 ENCOUNTER — Other Ambulatory Visit: Payer: Self-pay | Admitting: Pharmacist Clinician (PhC)/ Clinical Pharmacy Specialist

## 2016-07-05 DIAGNOSIS — E785 Hyperlipidemia, unspecified: Secondary | ICD-10-CM

## 2016-08-01 ENCOUNTER — Other Ambulatory Visit: Payer: Self-pay | Admitting: Cardiology

## 2016-08-02 ENCOUNTER — Other Ambulatory Visit: Payer: Self-pay | Admitting: Cardiology

## 2016-08-15 ENCOUNTER — Other Ambulatory Visit: Payer: Self-pay

## 2016-08-15 MED ORDER — CLOPIDOGREL BISULFATE 75 MG PO TABS: 75.0000 mg | ORAL_TABLET | Freq: Every day | ORAL | 7 refills | Status: DC

## 2016-09-12 ENCOUNTER — Telehealth: Payer: Self-pay | Admitting: Cardiology

## 2016-09-12 ENCOUNTER — Other Ambulatory Visit: Payer: Self-pay | Admitting: Internal Medicine

## 2016-09-12 DIAGNOSIS — I5030 Unspecified diastolic (congestive) heart failure: Secondary | ICD-10-CM | POA: Insufficient documentation

## 2016-09-12 DIAGNOSIS — I10 Essential (primary) hypertension: Secondary | ICD-10-CM | POA: Diagnosis not present

## 2016-09-12 DIAGNOSIS — R040 Epistaxis: Secondary | ICD-10-CM | POA: Diagnosis not present

## 2016-09-12 DIAGNOSIS — Z1231 Encounter for screening mammogram for malignant neoplasm of breast: Secondary | ICD-10-CM

## 2016-09-12 NOTE — Telephone Encounter (Signed)
Spoke to patient she states has been having nose bleeds randomly since 09/08/16.  When nose bleed starts if she looks down or picking something up.  Also  patient states her is now $135 dollars after insurance lovaza was $335 dollars a month- she did not purchase medication and bought [facility] brand  -and now taking.   RN instructed patient to use humidifer at her home ( she states she has one). May use saline nasal spray. If bleeding does not stop in few hours go to ER. Patient states she did go to primary - possible need cauterize But blood pressure was elevated at the time.   Patient aware will defer to Dr Ellyn Hack  Concerning medication and nose bleeds. Will defer to pharmacist in regards to [facility].  PATIENT SCHEDULE FOR 11/05/16  REGULAR 6 MONTH APPOINTMENT.

## 2016-09-12 NOTE — Telephone Encounter (Signed)
Patient never purchase lovaza.  pharmacist made aware. Cancel order for cholesterol level will await until appointment with Dr Ellyn Hack.

## 2016-09-12 NOTE — Telephone Encounter (Signed)
Have her get her cholesterol checked this week or next.  Then we can determine in 3 months if the OTC is as helpful as the lovaza.  (assuming she just ran out of lovaza in the past week).  There is a $400 deductible on most higher tier drugs - could it be that she has to pay it in 2 parts, thus the increase in price.

## 2016-09-12 NOTE — Telephone Encounter (Signed)
New message   Pt has been having nose bleeds since 09/08/16

## 2016-09-17 NOTE — Telephone Encounter (Signed)
I agree with pharmacy team about low positive. Spores metolazone goes, it depends on how often she is using it. If it is not very frequently, I would just simply have her double up her diuretic dose as opposed to using metolazone.  Glenetta Hew, MD

## 2016-09-20 NOTE — Telephone Encounter (Signed)
LEFT MESSAGE ON BOTH LINES TO CAL BACK

## 2016-09-27 ENCOUNTER — Telehealth: Payer: Self-pay | Admitting: Cardiology

## 2016-09-27 NOTE — Telephone Encounter (Signed)
Follow up     Following up from call she made to nurse last week about medication

## 2016-09-27 NOTE — Telephone Encounter (Signed)
I had opportunity to speak to [name] concerning her medications.  Discussed metolazone, notes she can no longer afford due to changes in their Rx coverage and new pharmacy requirement, etc. She cites a lot of concerns about fluid gain, I did relay that Dr. Ellyn Hack was OK w her increasing lasix but that I would seek recommendation - she's currently taking 80mg  in AM and 40mg  in PM, want to verify it's OK for her to double.  Note that she's not been able to take the metolazone recently (could not fill), but prior to this, she was taking daily. She does note a lot of swelling in her legs, "look like elephant legs", but she denies SOB, fatigue, etc.  Pt denies need to hear back urgently - OK to follow up on this next week, but I informed her if I got recommendations today I would call her.

## 2016-10-07 ENCOUNTER — Other Ambulatory Visit: Payer: Self-pay | Admitting: Cardiology

## 2016-10-07 DIAGNOSIS — I10 Essential (primary) hypertension: Secondary | ICD-10-CM

## 2016-10-08 DIAGNOSIS — I10 Essential (primary) hypertension: Secondary | ICD-10-CM | POA: Diagnosis not present

## 2016-10-09 DIAGNOSIS — K861 Other chronic pancreatitis: Secondary | ICD-10-CM | POA: Diagnosis not present

## 2016-10-09 DIAGNOSIS — I774 Celiac artery compression syndrome: Secondary | ICD-10-CM | POA: Diagnosis not present

## 2016-10-09 DIAGNOSIS — I110 Hypertensive heart disease with heart failure: Secondary | ICD-10-CM | POA: Diagnosis not present

## 2016-10-09 DIAGNOSIS — I5030 Unspecified diastolic (congestive) heart failure: Secondary | ICD-10-CM | POA: Diagnosis not present

## 2016-10-09 DIAGNOSIS — I701 Atherosclerosis of renal artery: Secondary | ICD-10-CM | POA: Diagnosis not present

## 2016-10-09 DIAGNOSIS — E1142 Type 2 diabetes mellitus with diabetic polyneuropathy: Secondary | ICD-10-CM | POA: Insufficient documentation

## 2016-10-10 ENCOUNTER — Encounter: Payer: Self-pay | Admitting: Gastroenterology

## 2016-10-10 ENCOUNTER — Ambulatory Visit (INDEPENDENT_AMBULATORY_CARE_PROVIDER_SITE_OTHER): Payer: Medicare Other | Admitting: Gastroenterology

## 2016-10-10 ENCOUNTER — Other Ambulatory Visit (INDEPENDENT_AMBULATORY_CARE_PROVIDER_SITE_OTHER): Payer: Medicare Other

## 2016-10-10 VITALS — BP 134/64 | HR 68 | Ht 68.0 in | Wt 235.0 lb

## 2016-10-10 DIAGNOSIS — R1013 Epigastric pain: Secondary | ICD-10-CM

## 2016-10-10 DIAGNOSIS — R1011 Right upper quadrant pain: Secondary | ICD-10-CM | POA: Diagnosis not present

## 2016-10-10 DIAGNOSIS — E1142 Type 2 diabetes mellitus with diabetic polyneuropathy: Secondary | ICD-10-CM | POA: Diagnosis not present

## 2016-10-10 DIAGNOSIS — M2041 Other hammer toe(s) (acquired), right foot: Secondary | ICD-10-CM | POA: Diagnosis not present

## 2016-10-10 DIAGNOSIS — Z8719 Personal history of other diseases of the digestive system: Secondary | ICD-10-CM

## 2016-10-10 DIAGNOSIS — R109 Unspecified abdominal pain: Secondary | ICD-10-CM | POA: Diagnosis not present

## 2016-10-10 DIAGNOSIS — M2042 Other hammer toe(s) (acquired), left foot: Secondary | ICD-10-CM | POA: Diagnosis not present

## 2016-10-10 DIAGNOSIS — L84 Corns and callosities: Secondary | ICD-10-CM | POA: Diagnosis not present

## 2016-10-10 DIAGNOSIS — B351 Tinea unguium: Secondary | ICD-10-CM | POA: Diagnosis not present

## 2016-10-10 LAB — COMPREHENSIVE METABOLIC PANEL
ALT: 13 U/L (ref 0–35)
AST: 13 U/L (ref 0–37)
Albumin: 4.2 g/dL (ref 3.5–5.2)
Alkaline Phosphatase: 84 U/L (ref 39–117)
BUN: 14 mg/dL (ref 6–23)
CO2: 32 mEq/L (ref 19–32)
Calcium: 9.5 mg/dL (ref 8.4–10.5)
Chloride: 103 mEq/L (ref 96–112)
Creatinine, Ser: 0.72 mg/dL (ref 0.40–1.20)
GFR: 84.04 mL/min (ref >60.00)
Glucose, Bld: 105 mg/dL — ABNORMAL HIGH (ref 70–99)
Potassium: 3.8 mEq/L (ref 3.5–5.1)
Sodium: 141 mEq/L (ref 135–145)
Total Bilirubin: 0.6 mg/dL (ref 0.2–1.2)
Total Protein: 7.7 g/dL (ref 6.0–8.3)

## 2016-10-10 LAB — CBC WITH DIFFERENTIAL/PLATELET
Basophils Absolute: 0.1 10*3/uL (ref 0.0–0.1)
Basophils Relative: 1.5 % (ref 0.0–3.0)
Eosinophils Absolute: 0.1 10*3/uL (ref 0.0–0.7)
Eosinophils Relative: 1.3 % (ref 0.0–5.0)
HCT: 39.9 % (ref 36.0–46.0)
Hemoglobin: 13.4 g/dL (ref 12.0–15.0)
Lymphocytes Relative: 21.6 % (ref 12.0–46.0)
Lymphs Abs: 2.1 10*3/uL (ref 0.7–4.0)
MCHC: 33.6 g/dL (ref 30.0–36.0)
MCV: 86.5 fl (ref 78.0–100.0)
Monocytes Absolute: 0.9 10*3/uL (ref 0.1–1.0)
Monocytes Relative: 9.8 % (ref 3.0–12.0)
Neutro Abs: 6.3 10*3/uL (ref 1.4–7.7)
Neutrophils Relative %: 65.8 % (ref 43.0–77.0)
Platelets: 242 10*3/uL (ref 150.0–400.0)
RBC: 4.61 Mil/uL (ref 3.87–5.11)
RDW: 13.5 % (ref 11.5–15.5)
WBC: 9.6 10*3/uL (ref 4.0–10.5)

## 2016-10-10 LAB — LIPASE: Lipase: 12 U/L (ref 11.0–59.0)

## 2016-10-10 NOTE — Telephone Encounter (Signed)
Honestly, this is can be supplemented 3 very hard to do an outpatient situation. I would like to see how much swelling she indeed does have. She is scheduled to see me on March 6. Maybe we can get her in to see one of the extenders initially to determine how bad the swelling is a potentially increase her diuretic dosing. I can then see her to follow-up after this as she is scheduled to see me in March. Usually one check labs for me to adjust diuretics and would like to know just exactly but were treating.   Glenetta Hew, MD

## 2016-10-10 NOTE — Progress Notes (Addendum)
10/10/2016 [name] [id number] 04-27-1941   HISTORY OF PRESENT ILLNESS:  [name] is a 76 year old female with a past medical history of pancreas divisum with remote pancreatitis, history of ERCP with biliary sphincterotomy, chronic epigastric and right upper quadrant pain.  Is here today again with complaints of abdominal pain.  Says that she has been having severe abdominal pain again in her RUQ and epigastrium for the past several months (says that it started sometime in the fall).  Says that it hurts with all kinds of movement and is extremely tender to touch.  Says that the right side feels swollen like there is a basketball in there.  Thinks that she has stones in there ducts.  Says that she "cannot live like this".  Wants to know what is causing the pain.  Was supposed to have pancreatic fecal elastase stool study performed after her last visit, but she did not do that. She was placed on amitriptyline at bedtime and says that she took it for a short time but does not like to take stuff like that did not really feel like it was helping.  See Dr. Vena Rua note from 10/05/2015 regarding her extensive evaluation for her chronic abdominal pain at [facility].   She was very dissatisfied with her visits at [facility].  Says that they were a waste of time.  Past Medical History:  Diagnosis Date  . Arthritis   . Back pain with radiation    Chronic Back Pain - mutliple surgeries (including tumor removal)  . Bilateral edema of lower extremity    Chronic, likely related to venous stasis  . Blood transfusion without reported diagnosis   . CAD S/P percutaneous coronary angioplasty    a) LHC: 07/30/10. -- 3.0x54mm Integrity BMS to pRCA & 2.5x 37mm Integrity BMS mLAD(@ D2).  b) Class III-IV Angina 03/2011: LHC- ISR in LAD BMS -- prox overlapping Promus DES 2.5x28mm and PTCA of jailed D2 ostium-prox 80%. c) 02/03/12:  LHC- patent stents.  Jailed diagonal. with stable flow; d) Peri-OP NSTEMI 04/2012 - LHC in  12/'13 -   . Calculus of kidney   . Congestive heart failure with LV diastolic dysfunction, NYHA class 2 ([place])    06/13/10:  last 2D echo-  EF >55%, Mild TR, Mod Conc LVH - Grade 1 diastolic dysfunction (abnormal relaxation) --> LVEDP on Cath 28 mmHg & mild 2nd Pulm HTN  . Diverticulitis of colon (without mention of hemorrhage)(562.11)   . Dyslipidemia, goal LDL below 70    Intolerant to statins  . GERD (gastroesophageal reflux disease)   . Hepatomegaly   . Hiatal hernia   . Irritable bowel syndrome (IBS)   . Labile essential hypertension    Partially related to RAS  . Mesenteric artery stenosis (HCC)    95% Celiac Artery - ostial, 20-30% SMA.  Bilateral Renal A: L RA stent patent, R RA 20-30% -- Conservative Management  . NSTEMI (non-ST elevated myocardial infarction) ([place]) 04/2012   Unclear the details, apparently this was postoperative from her back surgery that she was cleared for my last saw her in June.  Reportedly had stents placed  . Pancreas divisum    On pancreatic enzyme  . Renal artery stenosis ([place]) 2011; 12/'13   a) Angiogram 02/03/12:  50-60%L RA stenosis, 40% R R Inferior artery; b) 12/'13: S/P L RA Stent ([facility]) 6.0 mm x 15 mm; c) Renal Duplex 10/2013: <60% L RA, <60 R RA, ~60% SMA & Celiac  A.  . Stricture and stenosis of esophagus   . Ulcer ([place])   . Unspecified disorder of thyroid   . Unspecified gastritis and gastroduodenitis without mention of hemorrhage    Past Surgical History:  Procedure Laterality Date  . ABDOMINAL HYSTERECTOMY    . BREAST DUCTAL SYSTEM EXCISION     right  . CARDIAC CATHETERIZATION N/A 03/16/2015   Procedure: Left Heart Cath and Coronary Angiography;  Surgeon: Leonie Man, MD;  Location: [facility];  Service: Cardiovascular; Widely patent m-dLAD stents as wellas pRCA stent.  High LVEDP, small Diag & Om vessels with moderate stenosis   . CARDIOVASCULAR STRESS TEST     11/07/11:  Normal perfusion pattern.  EF 66% No wall motion  abnormalities.   Marland Kitchen CATARACT EXTRACTION    . CHOLECYSTECTOMY    . COLONOSCOPY  03/01/2009   normal   . CORONARY ANGIOPLASTY WITH STENT PLACEMENT  07/2010, 02/2011   LHC: 07/30/10.  3.0x15mm Integrity BMS to RCA and 2.5x 23mm Integrity BMS LAD.  03/2011: LHC- ISRS in LAD treated with overlapping Promus DES 2.5x7mm and PTCA of Diagonal.  02/03/12:  LHC- patent stents.  Jailed diagonal.  . DOBUTAMINE STRESS ECHO  03/07/2015   [facility] (ordered for pre-op evaluation for EUS/ERCP --> abnormal EKG:  strreesss test shhoowwed 1 mm ST segment depressions downsloping. No wall motion abnormalities at peak exercise or at rest. Diastolic dysfunction was noted but normal systolic function - EF greater than 55%. No bouts of regurgitation or stenosis. Resting hypertension with exaggerated response  . ESOPHAGOGASTRODUODENOSCOPY  04/08/2012  . FOOT SURGERY     right and left  . HAND SURGERY     left  . HIP SURGERY     left  . KNEE SURGERY     left  . LEFT HEART CATHETERIZATION WITH CORONARY ANGIOGRAM N/A 02/03/2012   Procedure: LEFT HEART CATHETERIZATION WITH CORONARY ANGIOGRAM;  Surgeon: Leonie Man, MD;  [facility] C[facility];  Service: Cardiovascular;  Laterality: N/A;  . LEFT HEART CATHETERIZATION WITH CORONARY ANGIOGRAM N/A 05/12/2014   Procedure: LEFT HEART CATHETERIZATION WITH CORONARY ANGIOGRAM;  Surgeon: Lorretta Harp, MD;  Location: [facility] CATH LAB;  Service: Cardiovascular: Stable CAD. Patent stents. Patent renal artery stent  . LEFT HEART CATHETERIZATION WITH CORONARY ANGIOGRAM  08/2012   Peri-Op MI @ [facility] -- 40% ostial D1, patent LAD stents, 10% RCA ISR  . LEFT HEART CATHETERIZATION WITH CORONARY ANGIOGRAM   03/2012   ~30% ISR RCA, patent LAD stent & D2; EDP ~28 mmHg  . minor papilla stent     Dr Pershing Proud  . RENAL ARTERY STENT Left 08/2012   @ [facility] - 6.0 mm x 15 mm  . SPINE SURGERY     tumor removed 07/2010; Redo Surgery 04/2012; Sacroiliac Sgx 08/2013  . VISCERAL  ANGIOGRAM N/A 05/12/2014   Procedure: VISCERAL ANGIOGRAM;  Surgeon: Lorretta Harp, MD;  Location: [facility] CATH LAB;  Service: Cardiovascular;  Laterality: N/A;    reports that she has never smoked. She has never used smokeless tobacco. She reports that she does not drink alcohol or use drugs. family history includes Cancer in her father; Colitis in her maternal grandfather; Diabetes in her brother; Heart attack in her brother, brother, brother, and father; Stroke in her brother and mother. Allergies  Allergen Reactions  . Codeine Phosphate Swelling  . Statins     Intolerant   . Tramadol     Kept patient  awake  . Iodine Swelling and Rash    REACTION: unspecified This allergy is to Topical iodine only.   . Prednisone Itching and Rash      Outpatient Encounter Prescriptions as of 10/10/2016  Medication Sig  . acetaminophen (TYLENOL) 500 MG tablet Take 500 mg by mouth every 6 (six) hours as needed for mild pain or moderate pain.  Marland Kitchen amitriptyline (ELAVIL) 25 MG tablet Take 1 tablet (25 mg total) by mouth at bedtime.  Marland Kitchen aspirin 325 MG tablet Take 325 mg by mouth daily.  . carvedilol (COREG) 3.125 MG tablet TAKE ONE TABLET BY MOUTH TWICE DAILY  . clopidogrel (PLAVIX) 75 MG tablet Take 1 tablet (75 mg total) by mouth daily.  . furosemide (LASIX) 40 MG tablet TAKE TWO TABLETS BY MOUTH IN THE MORNING AND TAKE ONE TABLET IN THE EVENING  . gabapentin (NEURONTIN) 600 MG tablet Take 1,200 mg by mouth 3 (three) times daily.  . isosorbide mononitrate (IMDUR) 30 MG 24 hr tablet Take 1 tablet (30 mg total) by mouth every morning.  . lidocaine (XYLOCAINE) 5 % ointment Apply 1 application topically daily.  Marland Kitchen NITROSTAT 0.4 MG SL tablet PLACE ONE TABLET UNDER THE TONGUE EVERY 5 MINTUES AS NEEDED FOR CHEST PAIN  . losartan (COZAAR) 50 MG tablet Take 1 tablet (50 mg total) by mouth daily.  . metolazone (ZAROXOLYN) 2.5 MG tablet Take 1 tablet (2.5 mg total) by mouth daily as needed.  . [DISCONTINUED] omega-3  acid ethyl esters (LOVAZA) 1 g capsule Take 2 capsules (2 g total) by mouth 2 (two) times daily. (Patient not taking: Reported on 10/10/2016)   No facility-administered encounter medications on file as of 10/10/2016.      REVIEW OF SYSTEMS  : All other systems reviewed and negative except where noted in the History of Present Illness.   PHYSICAL EXAM: BP 134/64   Pulse 68   Ht 5\' 8"  (1.727 m)   Wt 235 lb (106.6 kg)   SpO2 99%   BMI 35.73 kg/m  General: Well developed white female in no acute distress; non-toxic appearing Head: Normocephalic and atraumatic Eyes:  Sclerae anicteric, conjunctiva pink. Ears: Normal auditory acuity Lungs: Clear throughout to auscultation Heart: Regular rate and rhythm Abdomen: Soft, non-distended.  Normal bowel sounds.  Moderate TTP in RUQ.  Multiple scars noted on abdomen. Musculoskeletal: Symmetrical with no gross deformities  Skin: No lesions on visible extremities Extremities: No edema  Neurological: Alert oriented x 4, grossly non-focal Psychological:  Alert and cooperative. Normal mood and affect  ASSESSMENT AND PLAN: 76 year old female with a past medical history of pancreas divisum with remote pancreatitis, history of ERCP with biliary sphincterotomy, chronic epigastric and right upper quadrant pain who is here with ongoing complaints of abdominal pain.   1. Chronic and episodic epigastric and right upper quadrant abdominal pain/pancreas divisum -- she is had thorough evaluation at [facility].  I will have her submit pancreatic fecal elastase for completeness since she was supposed to do this previously.  Due to her complaints of worsening pain I will recheck an MRI abdomen/MRCP.  Will check CBC, CMP, and lipase as well.  I am unsure of the cause of her pain.  ? If it is chronic pain due to scar tissue/adhesions.  She is unwilling to try medications to pain such as Cymbalta, etc at this point and wants to know the cause of her pain.  Has follow-up with Dr.  Hilarie Fredrickson in March, which she will keep.   CC:  Kristopher Glee., MD  Addendum: Reviewed and agree with management. Jerene Bears, MD

## 2016-10-10 NOTE — Patient Instructions (Addendum)
Your physician has requested that you go to the basement for lab work before leaving today.  You have been scheduled for an MRI at [facility] on 10-16-16. Your appointment time is 9 am. Please arrive 15 minutes prior to your appointment time for registration purposes. Please make certain not to have anything to eat or drink 6 hours prior to your test. In addition, if you have any metal in your body, have a pacemaker or defibrillator, please be sure to let your ordering physician know. This test typically takes 45 minutes to 1 hour to complete.

## 2016-10-11 ENCOUNTER — Encounter: Payer: Self-pay | Admitting: Gastroenterology

## 2016-10-11 DIAGNOSIS — R109 Unspecified abdominal pain: Secondary | ICD-10-CM | POA: Insufficient documentation

## 2016-10-11 DIAGNOSIS — R1011 Right upper quadrant pain: Secondary | ICD-10-CM | POA: Insufficient documentation

## 2016-10-11 NOTE — Telephone Encounter (Signed)
LATE ENTRY SPOKE TO PATIENT EARILER IN THE DAY   APPOINTMENT SCHEDULE FOR NEXT WEEK 10/17/16

## 2016-10-15 ENCOUNTER — Other Ambulatory Visit: Payer: Self-pay | Admitting: Gastroenterology

## 2016-10-15 ENCOUNTER — Ambulatory Visit
Admission: RE | Admit: 2016-10-15 | Discharge: 2016-10-15 | Disposition: A | Discharge status: Home / Self Care | Payer: Medicare Other | Source: Ambulatory Visit | Attending: Internal Medicine | Admitting: Internal Medicine

## 2016-10-15 DIAGNOSIS — Z8719 Personal history of other diseases of the digestive system: Secondary | ICD-10-CM

## 2016-10-15 DIAGNOSIS — R1011 Right upper quadrant pain: Secondary | ICD-10-CM

## 2016-10-15 DIAGNOSIS — Z1231 Encounter for screening mammogram for malignant neoplasm of breast: Secondary | ICD-10-CM

## 2016-10-15 DIAGNOSIS — R109 Unspecified abdominal pain: Secondary | ICD-10-CM

## 2016-10-15 DIAGNOSIS — R1013 Epigastric pain: Secondary | ICD-10-CM

## 2016-10-16 ENCOUNTER — Ambulatory Visit (HOSPITAL_COMMUNITY)
Admission: RE | Admit: 2016-10-16 | Discharge: 2016-10-16 | Disposition: A | Discharge status: Home / Self Care | Payer: Medicare Other | Source: Ambulatory Visit | Attending: Gastroenterology | Admitting: Gastroenterology

## 2016-10-16 DIAGNOSIS — R1013 Epigastric pain: Secondary | ICD-10-CM

## 2016-10-16 DIAGNOSIS — D3502 Benign neoplasm of left adrenal gland: Secondary | ICD-10-CM | POA: Insufficient documentation

## 2016-10-16 DIAGNOSIS — Z9049 Acquired absence of other specified parts of digestive tract: Secondary | ICD-10-CM | POA: Insufficient documentation

## 2016-10-16 DIAGNOSIS — K760 Fatty (change of) liver, not elsewhere classified: Secondary | ICD-10-CM | POA: Insufficient documentation

## 2016-10-16 DIAGNOSIS — R109 Unspecified abdominal pain: Secondary | ICD-10-CM

## 2016-10-16 DIAGNOSIS — Z8719 Personal history of other diseases of the digestive system: Secondary | ICD-10-CM

## 2016-10-16 DIAGNOSIS — Z9889 Other specified postprocedural states: Secondary | ICD-10-CM | POA: Diagnosis not present

## 2016-10-16 DIAGNOSIS — I700 Atherosclerosis of aorta: Secondary | ICD-10-CM | POA: Diagnosis not present

## 2016-10-16 DIAGNOSIS — R1011 Right upper quadrant pain: Secondary | ICD-10-CM

## 2016-10-16 MED ORDER — GADOBENATE DIMEGLUMINE 529 MG/ML IV SOLN
20.0000 mL | Freq: Once | INTRAVENOUS | Status: AC | PRN
  Administered 2016-10-16: 20 mL via INTRAVENOUS

## 2016-10-17 ENCOUNTER — Ambulatory Visit (INDEPENDENT_AMBULATORY_CARE_PROVIDER_SITE_OTHER): Payer: Medicare Other | Admitting: Physician Assistant

## 2016-10-17 ENCOUNTER — Encounter: Payer: Self-pay | Admitting: Physician Assistant

## 2016-10-17 ENCOUNTER — Other Ambulatory Visit: Payer: Medicare Other

## 2016-10-17 VITALS — BP 176/78 | HR 83 | Ht 68.0 in | Wt 237.0 lb

## 2016-10-17 DIAGNOSIS — R109 Unspecified abdominal pain: Secondary | ICD-10-CM | POA: Diagnosis not present

## 2016-10-17 DIAGNOSIS — Z8719 Personal history of other diseases of the digestive system: Secondary | ICD-10-CM

## 2016-10-17 DIAGNOSIS — I5033 Acute on chronic diastolic (congestive) heart failure: Secondary | ICD-10-CM

## 2016-10-17 DIAGNOSIS — I2510 Atherosclerotic heart disease of native coronary artery without angina pectoris: Secondary | ICD-10-CM | POA: Diagnosis not present

## 2016-10-17 DIAGNOSIS — R1013 Epigastric pain: Secondary | ICD-10-CM | POA: Diagnosis not present

## 2016-10-17 DIAGNOSIS — Z79899 Other long term (current) drug therapy: Secondary | ICD-10-CM | POA: Diagnosis not present

## 2016-10-17 DIAGNOSIS — Z9861 Coronary angioplasty status: Secondary | ICD-10-CM

## 2016-10-17 DIAGNOSIS — R1011 Right upper quadrant pain: Secondary | ICD-10-CM

## 2016-10-17 NOTE — Progress Notes (Signed)
Cardiology Office Note   Date:  10/17/2016   ID:  [name], DOB 02-Feb-1941, MRN [id number]  PCP:  Yong Channel, MD  Cardiologist:  Dr Ellyn Hack 05/01/2016  Rosaria Ferries, PA-C   Chief Complaint  Patient presents with  . Follow-up    pt c/o DOE AND SWELLING     History of Present Illness: [name] is a 76 y.o. female with a history of 50% dAortic stenosis, 70% celiac/SMA PCI R-Renal, BMS pRCA & mLAD x 2 in 2011, DES x 2 LAD for ISR, patent stent cath 2016 w/ nl EF, D-CHF, HTN  Phone notes about increased LE edema, Insurance no longer covers metolazone so not taking  [name] presents for cardiology evaluation and follow up.  She was not able to afford Lovaza ($300/month) but is on fish oil by [employer].   She was not able to afford the metolazone and has been off it for 6 months. Her weight has been trending up gradually for a long time. She added a 3rd pillow 4-5 months ago. She denies PND. She gets short of breath walking to the mailbox and it is all she can do. This is about 150 feet. She feels that she possibly and swelling ever she has to walk that distance or more.  She has not been doing daily weights. She has cut back on sodium, but does not look at the milligrams closely. She cooks mostly fresh fruit, but does add salt to it. Her husband adds more salt. He is a diabetic and she is a borderline diabetic. They do not stay closely to a diabetic diet. However, she does cook a lot of vegetables because he likes them. She has cut back on cooking potatoes but does not have a clear idea of what all carbohydrates are, or appropriate portion sizes. She was surprised that she should stick to 2 L of fluid restriction, she always thought she was supposed to drink as much liquid as possible. She drinks mostly tea but does occasionally drink some water.  She is aware her BP is high today, has not taken her meds yet today.   Her legs swell a great deal during the day, also  has some swelling first thing am. She cannot get prescription compression socks on/off.   She has chronic diarrhea, sees GI for this.    Past Medical History:  Diagnosis Date  . Arthritis   . Back pain with radiation    Chronic Back Pain - mutliple surgeries (including tumor removal)  . Bilateral edema of lower extremity    Chronic, likely related to venous stasis  . Blood transfusion without reported diagnosis   . CAD S/P percutaneous coronary angioplasty    a) LHC: 07/30/10. -- 3.0x37mm Integrity BMS to pRCA & 2.5x 2mm Integrity BMS mLAD(@ D2).  b) Class III-IV Angina 03/2011: LHC- ISR in LAD BMS -- prox overlapping Promus DES 2.5x25mm and PTCA of jailed D2 ostium-prox 80%. c) 02/03/12:  LHC- patent stents.  Jailed diagonal. with stable flow; d) Peri-OP NSTEMI 04/2012 - LHC in 12/'13 -   . Calculus of kidney   . Congestive heart failure with LV diastolic dysfunction, NYHA class 2 ([place])    06/13/10:  last 2D echo-  EF >55%, Mild TR, Mod Conc LVH - Grade 1 diastolic dysfunction (abnormal relaxation) --> LVEDP on Cath 28 mmHg & mild 2nd Pulm HTN  . Diverticulitis of colon (without mention of hemorrhage)(562.11)   . Dyslipidemia, goal LDL below  42    Intolerant to statins  . GERD (gastroesophageal reflux disease)   . Hepatomegaly   . Hiatal hernia   . Irritable bowel syndrome (IBS)   . Labile essential hypertension    Partially related to RAS  . Mesenteric artery stenosis (H[facility])    95% Celiac Artery - ostial, 20-30% SMA.  Bilateral Renal A: L RA stent patent, R RA 20-30% -- Conservative Management  . NSTEMI (non-ST elevated myocardial infarction) ([place]) 04/2012   Unclear the details, apparently this was postoperative from her back surgery that she was cleared for my last saw her in June.  Reportedly had stents placed  . Pancreas divisum    On pancreatic enzyme  . Renal artery stenosis ([place]) 2011; 12/'13   a) Angiogram 02/03/12:  50-60%L RA stenosis, 40% R R Inferior artery; b) 12/'13: S/P L  RA Stent ([facility]) 6.0 mm x 15 mm; c) Renal Duplex 10/2013: <60% L RA, <60 R RA, ~60% SMA & Celiac A.  . Stricture and stenosis of esophagus   . Ulcer ([place])   . Unspecified disorder of thyroid   . Unspecified gastritis and gastroduodenitis without mention of hemorrhage     Past Surgical History:  Procedure Laterality Date  . ABDOMINAL HYSTERECTOMY    . BREAST DUCTAL SYSTEM EXCISION     right  . CARDIAC CATHETERIZATION N/A 03/16/2015   Procedure: Left Heart Cath and Coronary Angiography;  Surgeon: Leonie Man, MD;  Location: [place] [facility];  Service: Cardiovascular; Widely patent m-dLAD stents as wellas pRCA stent.  High LVEDP, small Diag & Om vessels with moderate stenosis   . CARDIOVASCULAR STRESS TEST     11/07/11:  Normal perfusion pattern.  EF 66% No wall motion abnormalities.   Marland Kitchen CATARACT EXTRACTION    . CHOLECYSTECTOMY    . COLONOSCOPY  03/01/2009   normal   . CORONARY ANGIOPLASTY WITH STENT PLACEMENT  07/2010, 02/2011   LHC: 07/30/10.  3.0x77mm Integrity BMS to RCA and 2.5x 13mm Integrity BMS LAD.  03/2011: LHC- ISRS in LAD treated with overlapping Promus DES 2.5x78mm and PTCA of Diagonal.  02/03/12:  LHC- patent stents.  Jailed diagonal.  . DOBUTAMINE STRESS ECHO  03/07/2015   [facility] (ordered for pre-op evaluation for EUS/ERCP --> abnormal EKG:  strreesss test shhoowwed 1 mm ST segment depressions downsloping. No wall motion abnormalities at peak exercise or at rest. Diastolic dysfunction was noted but normal systolic function - EF greater than 55%. No bouts of regurgitation or stenosis. Resting hypertension with exaggerated response  . ESOPHAGOGASTRODUODENOSCOPY  04/08/2012  . FOOT SURGERY     right and left  . HAND SURGERY     left  . HIP SURGERY     left  . KNEE SURGERY     left  . LEFT HEART CATHETERIZATION WITH CORONARY ANGIOGRAM N/A 02/03/2012   Procedure: LEFT HEART CATHETERIZATION WITH CORONARY ANGIOGRAM;  Surgeon: Leonie Man, MD;  Location: [facility] CATH LAB;  Service:  Cardiovascular;  Laterality: N/A;  . LEFT HEART CATHETERIZATION WITH CORONARY ANGIOGRAM N/A 05/12/2014   Procedure: LEFT HEART CATHETERIZATION WITH CORONARY ANGIOGRAM;  Surgeon: Lorretta Harp, MD;  Location: [facility] CATH LAB;  Service: Cardiovascular: Stable CAD. Patent stents. Patent renal artery stent  . LEFT HEART CATHETERIZATION WITH CORONARY ANGIOGRAM  08/2012   Peri-Op MI @ [facility] -- 40% ostial D1, patent LAD stents, 10% RCA ISR  . LEFT HEART CATHETERIZATION WITH CORONARY ANGIOGRAM   03/2012   ~30%  ISR RCA, patent LAD stent & D2; EDP ~28 mmHg  . minor papilla stent     Dr Pershing Proud  . RENAL ARTERY STENT Left 08/2012   @ [facility] - 6.0 mm x 15 mm  . SPINE SURGERY     tumor removed 07/2010; Redo Surgery 04/2012; Sacroiliac Sgx 08/2013  . VISCERAL ANGIOGRAM N/A 05/12/2014   Procedure: VISCERAL ANGIOGRAM;  Surgeon: Lorretta Harp, MD;  Location: [facility] CATH LAB;  Service: Cardiovascular;  Laterality: N/A;    Medication Sig  . acetaminophen (TYLENOL) 500 MG tablet Take 500 mg by mouth every 6 (six) hours as needed for mild pain or moderate pain.  Marland Kitchen amitriptyline (ELAVIL) 25 MG tablet Take 1 tablet (25 mg total) by mouth at bedtime.  . carvedilol (COREG) 3.125 MG tablet TAKE ONE TABLET BY MOUTH TWICE DAILY  . clopidogrel (PLAVIX) 75 MG tablet Take 1 tablet (75 mg total) by mouth daily.  . furosemide (LASIX) 40 MG tablet TAKE TWO TABLETS BY MOUTH IN THE MORNING AND TAKE ONE TABLET IN THE EVENING  . gabapentin (NEURONTIN) 600 MG tablet Take 1,200 mg by mouth 3 (three) times daily.  . isosorbide mononitrate (IMDUR) 30 MG 24 hr tablet Take 1 tablet (30 mg total) by mouth every morning.  . lidocaine (XYLOCAINE) 5 % ointment Apply 1 application topically daily.  Marland Kitchen NITROSTAT 0.4 MG SL tablet PLACE ONE TABLET UNDER THE TONGUE EVERY 5 MINTUES AS NEEDED FOR CHEST PAIN  . losartan (COZAAR) 50 MG tablet Take 1 tablet (50 mg total) by mouth daily.   No current facility-administered  medications for this visit.     Allergies:   Codeine phosphate; Iodine; Prednisone; Statins; and Tramadol    Social History:  The patient  reports that she has never smoked. She has never used smokeless tobacco. She reports that she does not drink alcohol or use drugs.   Family History:  The patient's family history includes Cancer in her father; Colitis in her maternal grandfather; Diabetes in her brother; Heart attack in her brother, brother, brother, and father; Stroke in her brother and mother.    ROS:  Please see the history of present illness. All other systems are reviewed and negative.    PHYSICAL EXAM: VS:  BP (!) 176/78 (BP Location: Right Arm, Patient Position: Sitting, Cuff Size: Large)   Pulse 83   Ht 5\' 8"  (1.727 m)   Wt 237 lb (107.5 kg)   BMI 36.04 kg/m  , BMI Body mass index is 36.04 kg/m. GEN: Well nourished, well developed, female in no acute distress  HEENT: normal for age  Neck: 8 cm JVD, mildly positive hepatojugular reflux no carotid bruit, no masses Cardiac: RRR; no murmur, no rubs, or gallops Respiratory:  clear to auscultation bilaterally, normal work of breathing GI: soft, nontender, nondistended, + BS MS: no deformity or atrophy; 1+ bilateral lower extremity edema; distal pulses are 2+ in all 4 extremities   Skin: warm and dry, no rash Neuro:  Strength and sensation are intact Psych: euthymic mood, full affect   EKG:  EKG is ordered today. The ekg ordered today demonstrates sinus rhythm, heart rate 83, ST and T wave abnormalities are similar to previous ECGs  CATH: 03/16/2015 1. The left ventricular systolic function is normal by echocardiogram. However the LVEDP is elevated to 20 mmHg with normal systolic blood pressure - suggesting diastolic dysfunction 2. Mid LAD to Dist LAD lesion, 5% stenosed. The areas stented with tapping integrity bare-metal  stent upstream and Promus DES stent distally jailing the D2. Ostial D2 has minimal stenosis. 3. Prox  RCA lesion, 30% stenosed - with evidence of catheter induced spasm. Beyond this the Prox RCA to Mid RCA bare-metal stent has 10% in-stent restenosis. Integrity BMS- Nov 2011 4. Ost 1st Mrg to 1st Mrg lesion, 60% stenosed. Ost 1st Diag to 1st Diag lesion, 60% stenosed.  Widely patent stents in the mid to distal LAD as well as proximal RCA. Jailed diagonal branch also with no significant ostial disease. A small diagonal and OM branch have ostial lesions but otherwise no significant disease. Despite the stress echo suggestion of normal diastolic pressures. Clearly she has high/elevated LV filling pressures consistent with hypertension. This goes along with a tortuous coronaries. Recommendation:  Standard post radial TR band removal  She should be low risk for her endoscopic procedure. Okay to stop Plavix today.  I will start Imdur for after/preload reduction and potential spasm related angina.  She needs to continue with the beta blocker and ARB as scribe. We will likely titrate these up in her follow-up visit.   Recent Labs: 10/10/2016: ALT 13; BUN 14; Creatinine, Ser 0.72; Hemoglobin 13.4; Platelets 242.0; Potassium 3.8; Sodium 141    Lipid Panel    Component Value Date/Time   CHOL 219 (H) 05/07/2016 1018   TRIG 303 (H) 05/07/2016 1018   HDL 34 (L) 05/07/2016 1018   CHOLHDL 6.4 (H) 05/07/2016 1018   VLDL 61 (H) 05/07/2016 1018   LDLCALC 124 05/07/2016 1018     Wt Readings from Last 3 Encounters:  10/17/16 237 lb (107.5 kg)  10/10/16 235 lb (106.6 kg)  05/01/16 232 lb 6.4 oz (105.4 kg)     Other studies Reviewed: Additional studies/ records that were reviewed today include: Office nose, hospital records and testing.  ASSESSMENT AND PLAN:  1.  Acute on chronic diastolic CHF: Her weight gain has been so gradual, it is difficult to tell how much is acute and how much is chronic. However, she has increased orthopnea, she wakes with lower extremity edema, and her dyspnea on exertion  she feels is significantly worse than it used to be.  Increase Lasix to 80 mg twice a day. If she does not diurese with this, we may need to change to torsemide. I found a coupon on [email or web address] that may make the metolazone affordable. She was given this.  She was requested to stick to a 2 g sodium ADA carb modified diet with 2 L of fluid restrictions daily.  She is to do daily weights.  She is to call us for a weight gain of 3 pounds in a day or 5 pounds in a week.  She is to restart the metolazone on an as-needed basis.  If she does not lose any weight, or cannot get the metolazone filled she is to call us.  2. CAD: No ischemic sx, continue Plavix, Coreg, Imdur, Cozaar.    Current medicines are reviewed at length with the patient today.  The patient has concerns regarding medicines. Concerns were addressed  The following changes have been made:  Restart the metolazone with a coupon, increase Lasix  Labs/ tests ordered today include:   Orders Placed This Encounter  Procedures  . Basic metabolic panel  . EKG 12-Lead     Disposition:   FU with Dr. Ellyn Hack as scheduled  Signed, Lenoard Aden  10/17/2016 12:43 PM    [place] Phone: [phone]; Fax: [phone]  This note was written with the assistance of speech recognition software. Please excuse any transcriptional errors.

## 2016-10-17 NOTE — Patient Instructions (Addendum)
Suanne Marker, PA recommends the following: -- maximum 2L fluid -- maximum 2000mg  sodium per day -- monitor daily weights  -- weigh same time each day, in same amount of clothing  -- call if you gain 3lbs in 24 hours or 5lbs in 1 week -- call if you develop leg cramps -- call if you are able to get prescription for metolazone 2.5mg    (take 30-60 minutes prior to AM dose of lasix as needed)  Your physician has recommended you make the following change in your medication: -- INCREASE lasix to 80mg  twice daily  Keep appointment with Dr. Ellyn Hack on March 6  Your physician recommends that you return for lab work prior to your appointment if able; if not, have done same day as appointment

## 2016-10-18 ENCOUNTER — Other Ambulatory Visit: Payer: Self-pay | Admitting: Physician Assistant

## 2016-10-18 MED ORDER — METOLAZONE 2.5 MG PO TABS: ORAL_TABLET | ORAL | 5 refills | Status: DC

## 2016-10-18 NOTE — Telephone Encounter (Signed)
Metolazone Rx sent to pharmacy

## 2016-10-18 NOTE — Telephone Encounter (Signed)
Pt husband said that a discount coupon is available but no NEW prescription for metolazone 2.5 1x day

## 2016-10-22 LAB — PANCREATIC ELASTASE, FECAL

## 2016-10-24 ENCOUNTER — Ambulatory Visit (HOSPITAL_COMMUNITY)
Admission: RE | Admit: 2016-10-24 | Discharge: 2016-10-24 | Disposition: A | Discharge status: Home / Self Care | Payer: Medicare Other | Source: Ambulatory Visit | Attending: Cardiovascular Disease | Admitting: Cardiovascular Disease

## 2016-10-24 DIAGNOSIS — I700 Atherosclerosis of aorta: Secondary | ICD-10-CM | POA: Diagnosis not present

## 2016-10-24 DIAGNOSIS — Z95820 Peripheral vascular angioplasty status with implants and grafts: Secondary | ICD-10-CM | POA: Diagnosis not present

## 2016-10-24 DIAGNOSIS — I774 Celiac artery compression syndrome: Secondary | ICD-10-CM | POA: Diagnosis not present

## 2016-10-24 DIAGNOSIS — I771 Stricture of artery: Secondary | ICD-10-CM | POA: Insufficient documentation

## 2016-10-24 DIAGNOSIS — Z79899 Other long term (current) drug therapy: Secondary | ICD-10-CM | POA: Diagnosis not present

## 2016-10-24 DIAGNOSIS — I701 Atherosclerosis of renal artery: Secondary | ICD-10-CM | POA: Diagnosis not present

## 2016-10-24 DIAGNOSIS — I10 Essential (primary) hypertension: Secondary | ICD-10-CM | POA: Insufficient documentation

## 2016-10-25 LAB — BASIC METABOLIC PANEL
BUN: 29 mg/dL — ABNORMAL HIGH (ref 7–25)
CALCIUM: 9.7 mg/dL (ref 8.6–10.4)
CO2: 31 mmol/L (ref 20–31)
CREATININE: 0.9 mg/dL (ref 0.60–0.93)
Chloride: 93 mmol/L — ABNORMAL LOW (ref 98–110)
GLUCOSE: 131 mg/dL — AB (ref 65–99)
Potassium: 3.5 mmol/L (ref 3.5–5.3)
SODIUM: 138 mmol/L (ref 135–146)

## 2016-11-04 ENCOUNTER — Encounter: Payer: Self-pay | Admitting: *Deleted

## 2016-11-05 ENCOUNTER — Encounter: Payer: Self-pay | Admitting: Cardiology

## 2016-11-05 ENCOUNTER — Ambulatory Visit (INDEPENDENT_AMBULATORY_CARE_PROVIDER_SITE_OTHER): Payer: Medicare Other | Admitting: Cardiology

## 2016-11-05 VITALS — BP 134/72 | HR 84 | Ht 68.0 in | Wt 230.0 lb

## 2016-11-05 DIAGNOSIS — E785 Hyperlipidemia, unspecified: Secondary | ICD-10-CM

## 2016-11-05 DIAGNOSIS — I5030 Unspecified diastolic (congestive) heart failure: Secondary | ICD-10-CM | POA: Diagnosis not present

## 2016-11-05 DIAGNOSIS — Z9861 Coronary angioplasty status: Secondary | ICD-10-CM

## 2016-11-05 DIAGNOSIS — E669 Obesity, unspecified: Secondary | ICD-10-CM | POA: Diagnosis not present

## 2016-11-05 DIAGNOSIS — I2510 Atherosclerotic heart disease of native coronary artery without angina pectoris: Secondary | ICD-10-CM

## 2016-11-05 DIAGNOSIS — I701 Atherosclerosis of renal artery: Secondary | ICD-10-CM | POA: Diagnosis not present

## 2016-11-05 DIAGNOSIS — I87302 Chronic venous hypertension (idiopathic) without complications of left lower extremity: Secondary | ICD-10-CM | POA: Diagnosis not present

## 2016-11-05 DIAGNOSIS — I10 Essential (primary) hypertension: Secondary | ICD-10-CM

## 2016-11-05 DIAGNOSIS — R0609 Other forms of dyspnea: Secondary | ICD-10-CM | POA: Diagnosis not present

## 2016-11-05 DIAGNOSIS — E66811 Obesity, class 1: Secondary | ICD-10-CM

## 2016-11-05 DIAGNOSIS — I771 Stricture of artery: Secondary | ICD-10-CM

## 2016-11-05 DIAGNOSIS — I774 Celiac artery compression syndrome: Secondary | ICD-10-CM

## 2016-11-05 MED ORDER — NITROGLYCERIN 0.4 MG SL SUBL: SUBLINGUAL_TABLET | SUBLINGUAL | 11 refills | Status: DC

## 2016-11-05 MED ORDER — ISOSORBIDE MONONITRATE ER 60 MG PO TB24: 60.0000 mg | ORAL_TABLET | Freq: Every day | ORAL | 3 refills | Status: DC

## 2016-11-05 NOTE — Patient Instructions (Addendum)
MEDICATION CHANGE  INCREASE TO 60 MG OF ISOSORBIDE MONONITRATE  ONE TABLET DAILY.   Your physician wants you to follow-up in 6 months with Dr Ellyn Hack You will receive a reminder letter in the mail two months in advance. If you don't receive a letter, please call our office to schedule the follow-up appointment.   If you need a refill on your cardiac medications before your next appointment, please call your pharmacy.    [place] for the wonderful treat.

## 2016-11-05 NOTE — Progress Notes (Signed)
PCP: Yong Channel, MD  Clinic Note: Chief Complaint  Patient presents with  . Follow-up    frequent nose bleeds. swelling in the ankles and feet, metolazone has helped.   . Coronary Artery Disease  . Leg Swelling    HPI: [name] is a 76 y.o. female with a PMH below who presents today for 211 three-week follow-up - CAD, hypertension, hyperlipidemia and edema. CAD: pRCA & mLAD x 2 in 2011 (BMS stents used for preop back surgery), DES x 2 LAD for ISR. She also has PAD with renal and celiac/SMA disease with 50% distal aortic disease. Status post renal stent.. I last saw [name] back in August 2017 - we talked about her edema. We recommended support stockings as opposed to compression hose. She has intolerance of statin and was not able to afford Lovaza. - She was seen by Tommy Medal back in September. At that time she indicated that she was not interested in taking a statin because of having had a family with acute liver failure after taking it. She continues to hope to lower her cholesterol by weight loss and eating habits, but this is been going on now for 5 years. She was agreeable first.trying generic Lovaza for lowering triglycerides. They were considering potentially starting Zetia.  [name] was last seen on February 18 by Rosaria Ferries, PA - Indicated that she was unable to afford Lovaza, was taken cost, fish oil. Also unable to afford metolazone, therefore her edema got notably worse. As did her exertional dyspnea. She was not checking daily weights. Her Lasix dose was increased to 80 twice a day with thoughts that we may need to consider changing to torsemide. She was given a coupon for metolazone. Discussed reduced sodium diet. Encourage daily weights.  She still has ankle and foot edema, but much improved with metolazone being back on board.  Recent Hospitalizations: None  Studies Reviewed:   Renal Artery Dopplers 10/24/2016:  Recommendation is yearly  follow-up Technically challenging Normal caliber aorta with greater than 50% distal aortic stenosis. > 70% celiac and SMA stenosis (stable). Normal bilateral kidney size with normal bilateral renal arteries status post left renal artery stent. Patent IVC and bilateral renal veins.  Interval History: [name] presents today following up after her visit with Rosaria Ferries, PA. She says her edema has much improved, but she still has some swelling. Or a happy to be able to get her metolazone with a coupon that was provided. She is just not sure how long it will last. With improved edema, she has not had any further PND or orthopnea. She still, that she gets short of breath walking somewhere between 100- 200 feet, but this is chronic for her. She still has intermittent episodes of chest discomfort that can be in various places in her chest that she considers to be angina, but this is occurring at rest more so than with exertion. She has had similar symptoms and evaluated in the past. Although she gets tired walking and doing things, she still able to do her daily living activities. She enjoys cooking, and tanning/jarring vegetables or jellies/jams.    Remainder of cardiovascular review of symptoms: No palpitations, lightheadedness, dizziness, weakness or syncope/near syncope. No TIA/amaurosis fugax symptoms. No melena, hematochezia, hematuria, or epstaxis. No claudication.  ROS: A comprehensive was performed. Review of Systems  Constitutional: Negative for malaise/fatigue and weight loss.  HENT: Negative for congestion and hearing loss.   Respiratory: Positive for shortness of breath (Per history  of present illness).   Cardiovascular:       Per history of present illness  Gastrointestinal: Positive for diarrhea (Has been having significant diarrhea over the last few weeks.), heartburn and nausea. Negative for constipation.  Musculoskeletal: Positive for back pain and joint pain.  Skin: Negative.    Neurological: Positive for dizziness (Positional). Negative for focal weakness.  Endo/Heme/Allergies: Does not bruise/bleed easily.  Psychiatric/Behavioral: Negative for depression and memory loss. The patient is not nervous/anxious and does not have insomnia.   All other systems reviewed and are negative.   Past Medical History:  Diagnosis Date  . Arthritis   . Back pain with radiation    Chronic Back Pain - mutliple surgeries (including tumor removal)  . Bilateral edema of lower extremity    Chronic, likely related to venous stasis  . Blood transfusion without reported diagnosis   . CAD S/P percutaneous coronary angioplasty    a) LHC: 07/30/10. -- 3.0x42mm Integrity BMS to pRCA & 2.5x 70mm Integrity BMS mLAD(@ D2).  b) Class III-IV Angina 03/2011: LHC- ISR in LAD BMS -- prox overlapping Promus DES 2.5x1mm and PTCA of jailed D2 ostium-prox 80%. c) 02/03/12:  LHC- patent stents.  Jailed diagonal. with stable flow; d) Peri-OP NSTEMI 04/2012 - LHC in 12/'13 -   . Calculus of kidney   . Congestive heart failure with LV diastolic dysfunction, NYHA class 2 ([place])    06/13/10:  last 2D echo-  EF >55%, Mild TR, Mod Conc LVH - Grade 1 diastolic dysfunction (abnormal relaxation) --> LVEDP on Cath 28 mmHg & mild 2nd Pulm HTN  . Diverticulitis of colon (without mention of hemorrhage)(562.11)   . Dyslipidemia, goal LDL below 70    Intolerant to statins  . GERD (gastroesophageal reflux disease)   . Hepatomegaly   . Hiatal hernia   . Irritable bowel syndrome (IBS)   . Labile essential hypertension    Partially related to RAS  . Mesenteric artery stenosis (HCC)    95% Celiac Artery - ostial, 20-30% SMA.  Bilateral Renal A: L RA stent patent, R RA 20-30% -- Conservative Management  . NSTEMI (non-ST elevated myocardial infarction) ([place]) 04/2012   Unclear the details, apparently this was postoperative from her back surgery that she was cleared for my last saw her in June.  Reportedly had stents placed  .  Pancreas divisum    On pancreatic enzyme  . Renal artery stenosis ([place]) 2011; 12/'13   a) Angiogram 02/03/12:  50-60%L RA stenosis, 40% R R Inferior artery; b) 12/'13: S/P L RA Stent ([facility]) 6.0 mm x 15 mm; c) Renal Duplex 10/2013: <60% L RA, <60 R RA, ~60% SMA & Celiac A.  . Stricture and stenosis of esophagus   . Ulcer ([place])   . Unspecified disorder of thyroid   . Unspecified gastritis and gastroduodenitis without mention of hemorrhage     Past Surgical History:  Procedure Laterality Date  . ABDOMINAL HYSTERECTOMY    . BREAST DUCTAL SYSTEM EXCISION     right  . CARDIAC CATHETERIZATION N/A 03/16/2015   Procedure: Left Heart Cath and Coronary Angiography;  Surgeon: Leonie Man, MD;  Location: [facility];  Service: Cardiovascular; Widely patent m-dLAD stents as wellas pRCA stent.  High LVEDP, small Diag & Om vessels with moderate stenosis   . CARDIOVASCULAR STRESS TEST     11/07/11:  Normal perfusion pattern.  EF 66% No wall motion abnormalities.   Marland Kitchen CATARACT EXTRACTION    .  CHOLECYSTECTOMY    . COLONOSCOPY  03/01/2009   normal   . CORONARY ANGIOPLASTY WITH STENT PLACEMENT  07/2010, 02/2011   LHC: 07/30/10.  3.0x54mm Integrity BMS to RCA and 2.5x 22mm Integrity BMS LAD.  03/2011: LHC- ISRS in LAD treated with overlapping Promus DES 2.5x22mm and PTCA of Diagonal.  02/03/12:  LHC- patent stents.  Jailed diagonal.  . DOBUTAMINE STRESS ECHO  03/07/2015   [facility] (ordered for pre-op evaluation for EUS/ERCP --> abnormal EKG:  strreesss test shhoowwed 1 mm ST segment depressions downsloping. No wall motion abnormalities at peak exercise or at rest. Diastolic dysfunction was noted but normal systolic function - EF greater than 55%. No bouts of regurgitation or stenosis. Resting hypertension with exaggerated response  . ESOPHAGOGASTRODUODENOSCOPY  04/08/2012  . FOOT SURGERY     right and left  . HAND SURGERY     left  . HIP SURGERY     left  . KNEE SURGERY     left  . LEFT HEART  CATHETERIZATION WITH CORONARY ANGIOGRAM N/A 02/03/2012   Procedure: LEFT HEART CATHETERIZATION WITH CORONARY ANGIOGRAM;  Surgeon: Leonie Man, MD;  Location: [facility] CATH LAB;  Service: Cardiovascular;  Laterality: N/A;  . LEFT HEART CATHETERIZATION WITH CORONARY ANGIOGRAM N/A 05/12/2014   Procedure: LEFT HEART CATHETERIZATION WITH CORONARY ANGIOGRAM;  Surgeon: Lorretta Harp, MD;  Location: [facility] CATH LAB;  Service: Cardiovascular: Stable CAD. Patent stents. Patent renal artery stent  . LEFT HEART CATHETERIZATION WITH CORONARY ANGIOGRAM  08/2012   Peri-Op MI @ [facility] -- 40% ostial D1, patent LAD stents, 10% RCA ISR  . LEFT HEART CATHETERIZATION WITH CORONARY ANGIOGRAM   03/2012   ~30% ISR RCA, patent LAD stent & D2; EDP ~28 mmHg  . minor papilla stent     Dr Pershing Proud  . RENAL ARTERY STENT Left 08/2012   @ [facility] - 6.0 mm x 15 mm  . SPINE SURGERY     tumor removed 07/2010; Redo Surgery 04/2012; Sacroiliac Sgx 08/2013  . VISCERAL ANGIOGRAM N/A 05/12/2014   Procedure: VISCERAL ANGIOGRAM;  Surgeon: Lorretta Harp, MD;  Location: [facility] CATH LAB;  Service: Cardiovascular;  Laterality: N/A;    Current Meds  Medication Sig  . acetaminophen (TYLENOL) 500 MG tablet Take 500 mg by mouth every 6 (six) hours as needed for mild pain or moderate pain.  Marland Kitchen amitriptyline (ELAVIL) 25 MG tablet Take 1 tablet (25 mg total) by mouth at bedtime.  . carvedilol (COREG) 3.125 MG tablet TAKE ONE TABLET BY MOUTH TWICE DAILY  . clopidogrel (PLAVIX) 75 MG tablet Take 1 tablet (75 mg total) by mouth daily.  . furosemide (LASIX) 40 MG tablet Take 80 mg by mouth 2 (two) times daily.  Marland Kitchen gabapentin (NEURONTIN) 600 MG tablet Take 1,200 mg by mouth 3 (three) times daily.  Marland Kitchen lidocaine (XYLOCAINE) 5 % ointment Apply 1 application topically daily.  Marland Kitchen losartan (COZAAR) 50 MG tablet Take 1 tablet (50 mg total) by mouth daily.  . metolazone (ZAROXOLYN) 2.5 MG tablet Take 1 tablet by mouth 30-60 minutes prior to  morning dose of lasix AS NEEDED  . [DISCONTINUED] isosorbide mononitrate (IMDUR) 30 MG 24 hr tablet Take 1 tablet (30 mg total) by mouth every morning.  . [DISCONTINUED] NITROSTAT 0.4 MG SL tablet PLACE ONE TABLET UNDER THE TONGUE EVERY 5 MINTUES AS NEEDED FOR CHEST PAIN    Allergies  Allergen Reactions  . Codeine Phosphate Swelling  . Iodine Swelling and Rash  REACTION: unspecified This allergy is to Topical iodine only.   . Prednisone Itching and Rash  . Statins Other (See Comments)    Intolerant   . Tramadol Other (See Comments)    Kept patient awake    Social History   Social History  . Marital status: Married    Spouse name: N/A  . Number of children: 1  . Years of education: N/A   Occupational History  . gift shop owner    Social History Main Topics  . Smoking status: Never Smoker  . Smokeless tobacco: Never Used  . Alcohol use No  . Drug use: No  . Sexual activity: Not Asked   Other Topics Concern  . None   Social History Narrative   Married mother of one, one grandchild.   Very socially active, enjoys cooking and having get-togethers her house. She had been exercising regularly but her back pain limits her.   Does not smoke, does not drink alcohol.    family history includes Cancer in her father; Colitis in her maternal grandfather; Diabetes in her brother; Heart attack in her brother, brother, brother, and father; Stroke in her brother and mother.  Wt Readings from Last 3 Encounters:  11/05/16 104.3 kg (230 lb)  10/17/16 107.5 kg (237 lb)  10/10/16 106.6 kg (235 lb)    PHYSICAL EXAM BP 134/72   Pulse 84   Ht 5\' 8"  (1.727 m)   Wt 104.3 kg (230 lb)   SpO2 96%   BMI 34.97 kg/m  General appearance: alert, cooperative, appears stated age, no distress and mildly obese  HEENT: [place]/AT, EOMI, MMM, anicteric sclera  Neck: no adenopathy, soft R carotid bruit, no JVD and supple, symmetrical, trachea midline  Lungs: clear to auscultation bilaterally,  normal percussion bilaterally and Nonlabored, good air movement  Heart: RRR, normal S1 and S2 with an S4 gallop. 1/6 SEM at RUSB --> carotids. Nondisplaced PMI. No rubs  Abdomen: soft, non-tender; bowel sounds normal; no masses, no organomegaly  Extremities: no clubbing or cyanosis but with 1+ bilateral edema with varicosities.  Pulses: 2+ and symmetric  Neurologic: Mental status: Alert, oriented, thought content appropriate  Cranial nerves: normal; normal strength     Adult ECG Report n/a   Other studies Reviewed: Additional studies/ records that were reviewed today include:  Recent Labs:  Lab Results  Component Value Date   CREATININE 0.90 10/24/2016   BUN 29 (H) 10/24/2016   NA 138 10/24/2016   K 3.5 10/24/2016   CL 93 (L) 10/24/2016   CO2 31 10/24/2016     ASSESSMENT / PLAN: Problem List Items Addressed This Visit    CAD S/P percutaneous coronary angioplasty: pRCA BMS, mLAD BMS overlapped prox with DES for ISR - Primary (Chronic)    Think she is doing fairly well from an angina standpoint. She does have exertional dyspnea, we have evaluated this with stress test and cardiac catheterizations have shown patent stents and no significant disease. She probably has musculoskeletal chest pain episodes. I'm reluctant to do another study this point.  Plan: Continue Plavix without aspirin. Current dose of carvedilol which we may be titrated up based on blood pressure. Is on losartan. Continues to refuse to take statins. No longer on generic Lovaza. I will increase her Imdur to 60 mg daily for additional "antianginal effect "      Relevant Medications   nitroGLYCERIN (NITROSTAT) 0.4 MG SL tablet   isosorbide mononitrate (IMDUR) 60 MG 24 hr tablet   Celiac  artery stenosis: 60% by Duplex - 90-95% by cath - Med Rx (Chronic)    Plan per discussion with vascular physicians was medical management.      Relevant Medications   nitroGLYCERIN (NITROSTAT) 0.4 MG SL tablet    isosorbide mononitrate (IMDUR) 60 MG 24 hr tablet   Congestive heart failure with LV diastolic dysfunction, NYHA class 2 (HCC) (Chronic)    Her blood pressure looks better today on beta blocker and ARB. She is doing much better now that she is on Zaroxolyn. I told her to be judicious with additional doses of Lasix, but she could also use a higher dose of Lasix with Zaroxolyn if edema gets worse.  Hopefully she will continue to really get Zaroxolyn at the reduced rate. Potentially titrate beta blocker further versus ARB. For now we will increase Imdur to 90 mg daily      Relevant Medications   nitroGLYCERIN (NITROSTAT) 0.4 MG SL tablet   isosorbide mononitrate (IMDUR) 60 MG 24 hr tablet   Dyslipidemia, goal LDL below 70 - WITH STATIN INTOLERANCE (Chronic)    Unfortunately, she has not really shown signs of effective weight loss.  At this point I think I would like to see if she could potentially be a candidate for PCSK9 inhibitor. I will ask our Pharmacist [facility] ([place]) to continue to follow with her.      Relevant Medications   nitroGLYCERIN (NITROSTAT) 0.4 MG SL tablet   isosorbide mononitrate (IMDUR) 60 MG 24 hr tablet   Essential hypertension (Chronic)    Partly related to her history of renal artery stenosis. Now status post renal artery stenting.  Blood pressure actually was pretty good today. Tolerating Cozaar 50 mg (increased last visit). Tolerating current dose of carvedilol. Potentially increased carvedilol at next follow-up.      Relevant Medications   nitroGLYCERIN (NITROSTAT) 0.4 MG SL tablet   isosorbide mononitrate (IMDUR) 60 MG 24 hr tablet   Exertional dyspnea (Chronic)    Hard to really tell if this is related to cardiac disease or simply deconditioning. I am certain that there is a good portion of it related to deconditioning and lack of exercise. She does not have seated and diastolic dysfunction to explain the level of dyspnea she has. She may have microvascular  ischemia, but has not had evidence of macrovascular ischemia. Plan: Increasing Imdur to 60 mg. Consider Ranexa  Also consider pulmonary evaluation      Obesity (BMI 30.0-34.9) (Chronic)    Her weight continues to go up and down. Unfortunately its hard to say that she will get any benefit from these efforts as far as management goes. Just needs to exercise more even though she feels short of breath. I think the deconditioning is a major component. She says she's lost weight recently because she has not been to keep any food down or in her. She's been having frequent diarrhea.      Renal artery stenosis (HCC) (Chronic)    Stable by recent Dopplers. Annual follow-up      Relevant Medications   nitroGLYCERIN (NITROSTAT) 0.4 MG SL tablet   isosorbide mononitrate (IMDUR) 60 MG 24 hr tablet   Stasis edema of bilateral lower extremity (Chronic)    Again, with no true PND and orthopnea, I think that her leg edema is probably related to venous stasis and not true diastolic heart failure. I readdressed the issue with considering support stockings. Also continue with Lasix and when necessary Zaroxolyn. Also encouraged ambulation.  Current medicines are reviewed at length with the patient today. (+/- concerns) n/a The following changes have been made: -->  Patient Instructions  MEDICATION CHANGE  INCREASE TO 60 MG OF ISOSORBIDE MONONITRATE  ONE TABLET DAILY.   Your physician wants you to follow-up in 6 months with Dr Ellyn Hack You will receive a reminder letter in the mail two months in advance. If you don't receive a letter, please call our office to schedule the follow-up appointment.   If you need a refill on your cardiac medications before your next appointment, please call your pharmacy.    [place] for the wonderful treat.   Studies Ordered:   No orders of the defined types were placed in this encounter.     Glenetta Hew, M.D., M.S. Interventional Cardiologist     Pager # [phone] Phone # [phone] [address] [place], [place] [address]

## 2016-11-13 ENCOUNTER — Encounter: Payer: Self-pay | Admitting: Cardiology

## 2016-11-13 NOTE — Assessment & Plan Note (Signed)
Plan per discussion with vascular physicians was medical management.

## 2016-11-13 NOTE — Assessment & Plan Note (Signed)
Think she is doing fairly well from an angina standpoint. She does have exertional dyspnea, we have evaluated this with stress test and cardiac catheterizations have shown patent stents and no significant disease. She probably has musculoskeletal chest pain episodes. I'm reluctant to do another study this point.  Plan: Continue Plavix without aspirin. Current dose of carvedilol which we may be titrated up based on blood pressure. Is on losartan. Continues to refuse to take statins. No longer on generic Lovaza. I will increase her Imdur to 60 mg daily for additional "antianginal effect "

## 2016-11-13 NOTE — Assessment & Plan Note (Signed)
Her weight continues to go up and down. Unfortunately its hard to say that she will get any benefit from these efforts as far as management goes. Just needs to exercise more even though she feels short of breath. I think the deconditioning is a major component. She says she's lost weight recently because she has not been to keep any food down or in her. She's been having frequent diarrhea.

## 2016-11-13 NOTE — Assessment & Plan Note (Signed)
Unfortunately, she has not really shown signs of effective weight loss.  At this point I think I would like to see if she could potentially be a candidate for PCSK9 inhibitor. I will ask our Pharmacist Lipid Clinic ([place]) to continue to follow with her.

## 2016-11-13 NOTE — Assessment & Plan Note (Signed)
Again, with no true PND and orthopnea, I think that her leg edema is probably related to venous stasis and not true diastolic heart failure. I readdressed the issue with considering support stockings. Also continue with Lasix and when necessary Zaroxolyn. Also encouraged ambulation.

## 2016-11-13 NOTE — Assessment & Plan Note (Signed)
Her blood pressure looks better today on beta blocker and ARB. She is doing much better now that she is on Zaroxolyn. I told her to be judicious with additional doses of Lasix, but she could also use a higher dose of Lasix with Zaroxolyn if edema gets worse.  Hopefully she will continue to really get Zaroxolyn at the reduced rate. Potentially titrate beta blocker further versus ARB. For now we will increase Imdur to 90 mg daily

## 2016-11-13 NOTE — Assessment & Plan Note (Signed)
Hard to really tell if this is related to cardiac disease or simply deconditioning. I am certain that there is a good portion of it related to deconditioning and lack of exercise. She does not have seated and diastolic dysfunction to explain the level of dyspnea she has. She may have microvascular ischemia, but has not had evidence of macrovascular ischemia. Plan: Increasing Imdur to 60 mg. Consider Ranexa  Also consider pulmonary evaluation

## 2016-11-13 NOTE — Assessment & Plan Note (Signed)
Partly related to her history of renal artery stenosis. Now status post renal artery stenting.  Blood pressure actually was pretty good today. Tolerating Cozaar 50 mg (increased last visit). Tolerating current dose of carvedilol. Potentially increased carvedilol at next follow-up.

## 2016-11-13 NOTE — Assessment & Plan Note (Signed)
She reportedly had a postop MI, but did not have any further stents placed. She has preserved EF with no significant wall motion abnormalities suggest a significant scar.

## 2016-11-13 NOTE — Assessment & Plan Note (Signed)
Stable by recent Dopplers. Annual follow-up

## 2016-11-18 ENCOUNTER — Encounter: Payer: Self-pay | Admitting: Internal Medicine

## 2016-11-18 ENCOUNTER — Ambulatory Visit (INDEPENDENT_AMBULATORY_CARE_PROVIDER_SITE_OTHER): Payer: Medicare Other | Admitting: Internal Medicine

## 2016-11-18 VITALS — BP 142/66 | HR 76 | Ht 68.0 in | Wt 234.0 lb

## 2016-11-18 DIAGNOSIS — I708 Atherosclerosis of other arteries: Secondary | ICD-10-CM

## 2016-11-18 DIAGNOSIS — Q453 Other congenital malformations of pancreas and pancreatic duct: Secondary | ICD-10-CM | POA: Diagnosis not present

## 2016-11-18 DIAGNOSIS — R1011 Right upper quadrant pain: Secondary | ICD-10-CM | POA: Diagnosis not present

## 2016-11-18 DIAGNOSIS — R1013 Epigastric pain: Secondary | ICD-10-CM | POA: Diagnosis not present

## 2016-11-18 DIAGNOSIS — I701 Atherosclerosis of renal artery: Secondary | ICD-10-CM

## 2016-11-18 MED ORDER — DIPHENOXYLATE-ATROPINE 2.5-0.025 MG PO TABS: ORAL_TABLET | ORAL | 3 refills | Status: DC

## 2016-11-18 MED ORDER — PANTOPRAZOLE SODIUM 40 MG PO TBEC: 40.0000 mg | DELAYED_RELEASE_TABLET | Freq: Every day | ORAL | 3 refills | Status: DC

## 2016-11-18 MED ORDER — AMITRIPTYLINE HCL 25 MG PO TABS: 25.0000 mg | ORAL_TABLET | Freq: Every day | ORAL | 2 refills | Status: DC

## 2016-11-18 NOTE — Patient Instructions (Signed)
If you are age 75 or older, your body mass index should be between 23-30. Your Body mass index is 35.58 kg/m. If this is out of the aforementioned range listed, please consider follow up with your Primary Care Provider.  If you are age 71 or younger, your body mass index should be between 19-25. Your Body mass index is 35.58 kg/m. If this is out of the aformentioned range listed, please consider follow up with your Primary Care Provider.   We have sent the following medications to your pharmacy for you to pick up at your convenience: Protonix 40 mg Lomotil   Start taking Amitriptyline every night at bedtime.  Follow up with Dr. Hilarie Fredrickson in 2-3 months.  Thank you for choosing me and [facility].  Dr. Billie Lade

## 2016-11-18 NOTE — Progress Notes (Signed)
Subjective:    Patient ID: [name], female    DOB: 09/15/1940, 76 y.o.   MRN: [id number]  HPI [name] is a 76 year old female with a past medical history of pancreas divisum, history of ERCP with biliary sphincterotomy, chronic upper abdominal pain, CAD with peripheral vascular disease with known narrowing of the celiac artery and SMA who is here for follow-up. She was seen about a month ago by Alonza Bogus, PA-C.  She has continued to have severe attacks, episodic, of right upper quadrant pain. This radiates to her right back and also across her upper abdomen. This is associated with nausea and vomiting. Her upper abdomen feels swollen during these attacks. Attacks seem to worsen with eating but can occur without eating. States these attacks with loose stools but has on and off loose stools independent of these attacks as well. When she is having loose stools and diarrhea can occur 6-8 times per day. The stools are nonbloody and non-melenic. Imodium seems to stop her up "too much". She tried Zenpep in the past prescribed by Dr. Harl Bowie but this did not help the loose stools. She had a fecal elastase recently which was normal.  Prior evaluation of her upper abdominal pain with Dr. Harl Bowie along with secretin stimulated MRI. Dr. Harl Bowie did not feel like minor sphincterotomy would help and carried risk. This was in light of normal pancreatic duct size and lack of elevated liver and pancreatic enzymes during and after attacks.  The patient voices frustration about ongoing painful attacks and wishes these could somehow stop. Her it feels like gallbladder type pain though she is had prior cholecystectomy.  Recent repeat MRI/MRCP showed stable biliary ductal dilatation postcholecystectomy but no CBD stones. Pancreas divisum without other abnormality within the pancreatic parenchyma.  Recent liver enzymes were normal as was lipase.  Prior upper endoscopy with Dr. Katha Cabal showed bile gastritis  but no H. pylori or dysplasia. She's had some recent nausea and also mild indigestion.  Review of Systems As per HPI, otherwise negative  Current Medications, Allergies, Past Medical History, Past Surgical History, Family History and Social History were reviewed in [employer] record.     Objective:   Physical Exam BP (!) 142/66   Pulse 76   Ht '5\' 8"'$  (1.727 m)   Wt 234 lb (106.1 kg)   BMI 35.58 kg/m  Constitutional: Well-developed and well-nourished. No distress. HEENT: Normocephalic and atraumatic. Conjunctivae are normal.  No scleral icterus. Neck: Neck supple. Trachea midline. Cardiovascular: Normal rate, regular rhythm and intact distal pulses. No M/R/G Pulmonary/chest: Effort normal and breath sounds normal. No wheezing, rales or rhonchi. Abdominal: Soft, Well-healed abdominal incisions, epigastric and right upper quadrant tenderness without rebound or guarding, obese,  Bowel sounds active throughout.  Extremities: no clubbing, cyanosis, or 1+ pitting LE edema Neurological: Alert and oriented to person place and time. Skin: Skin is warm and dry.  Psychiatric: Normal mood and affect. Behavior is normal.  Lipase     Component Value Date/Time   LIPASE 12.0 10/10/2016 1215   CMP     Component Value Date/Time   NA 138 10/24/2016 1127   K 3.5 10/24/2016 1127   CL 93 (L) 10/24/2016 1127   CO2 31 10/24/2016 1127   GLUCOSE 131 (H) 10/24/2016 1127   BUN 29 (H) 10/24/2016 1127   CREATININE 0.90 10/24/2016 1127   CALCIUM 9.7 10/24/2016 1127   PROT 7.7 10/10/2016 1215   ALBUMIN 4.2 10/10/2016 1215   AST 13  10/10/2016 1215   ALT 13 10/10/2016 1215   ALKPHOS 84 10/10/2016 1215   BILITOT 0.6 10/10/2016 1215   GFRNONAA >60 03/16/2015 0609   GFRAA >60 03/16/2015 0609   CBC    Component Value Date/Time   WBC 9.6 10/10/2016 1215   RBC 4.61 10/10/2016 1215   HGB 13.4 10/10/2016 1215   HCT 39.9 10/10/2016 1215   PLT 242.0 10/10/2016 1215   MCV 86.5  10/10/2016 1215   MCH 28.6 03/16/2015 0609   MCHC 33.6 10/10/2016 1215   RDW 13.5 10/10/2016 1215   LYMPHSABS 2.1 10/10/2016 1215   MONOABS 0.9 10/10/2016 1215   EOSABS 0.1 10/10/2016 1215   BASOSABS 0.1 10/10/2016 1215     CLINICAL DATA:  Upper abdominal pain for several months. History of pancreatitis. Cholecystectomy.   EXAM: MRI ABDOMEN WITHOUT AND WITH CONTRAST (INCLUDING MRCP)   TECHNIQUE: Multiplanar multisequence MR imaging of the abdomen was performed both before and after the administration of intravenous contrast. Heavily T2-weighted images of the biliary and pancreatic ducts were obtained, and three-dimensional MRCP images were rendered by post processing.   CONTRAST:  45m MULTIHANCE GADOBENATE DIMEGLUMINE 529 MG/ML IV SOLN   COMPARISON:  03/22/2014 and 11/08/2014 CT   FINDINGS: Despite efforts by the technologist and patient, motion artifact is present on today's exam and could not be eliminated. This reduces exam sensitivity and specificity.   Lower chest: Unremarkable   Hepatobiliary: Diffuse hepatic steatosis.   Cholecystectomy. Common bile duct 1.1 cm in diameter. No significant degree of intrahepatic biliary dilatation. Distal conical tapering of the CBD. No significant abnormal hepatic enhancement.   Pancreas:  Unremarkable   Spleen:  Unremarkable   Adrenals/Urinary Tract: 1.5 by 1.8 cm left adrenal adenoma with dropout of signal on out of phase images.   There several tiny cysts in the left kidney upper pole. The kidneys appear otherwise unremarkable.   Stomach/Bowel: Unremarkable   Vascular/Lymphatic: Aortoiliac atherosclerotic vascular disease. There some atherosclerotic narrowing of the proximal celiac trunk and proximal SMA. Indistinct signal proximally in the left renal artery due to a renal artery stent. No pathologic adenopathy observed.   Other:  No supplemental non-categorized findings.   Musculoskeletal: Extensive lumbar and  lower thoracic postoperative findings with posterolateral rod and pedicle screw fixation. Patient has a known posterior fluid collection tracking between the 2 rods along the posterior decompression, without obvious abnormal marginal enhancement.   IMPRESSION: 1. No abnormal biliary enhancement observed; CBD 1.1 cm in diameter, mildly dilated but likely a physiologic response to cholecystectomy. No filling defect in the CBD identified. 2. Diffuse hepatic steatosis. 3. Small left adrenal adenoma. 4.  Aortoiliac atherosclerotic vascular disease. 5. Extensive lumbar and lower thoracic postoperative findings. 6. No current peripancreatic edema or specific imaging findings of pancreatitis. Dorsal pancreatic duct within normal limits. 7. A specific cause for the patient' s epigastric discomfort is not seen.     Electronically Signed   By: WVan ClinesM.D.   On: 10/16/2016 11:48      Assessment & Plan:  76year old female with a past medical history of pancreas divisum, history of ERCP with biliary sphincterotomy, chronic upper abdominal pain, CAD with peripheral vascular disease with known narrowing of the celiac artery and SMA who is here for follow-up.  1. Chronic and episodic epigastric and right upper quadrant abdominal pain/history of pancreas divisum/peripheral mesenteric artery disease -- she has continued to had painful episodes but has not had objective evidence of pancreatitis including pancreatitis by imaging nor elevated  liver or pancreatic enzymes. She has met with biliary expert, Dr. Harl Bowie, on several occasions and he does not feel that minor sphincterotomy is worth the risk. I certainly trust his opinion. I am suspicious for mesenteric ischemia though she has not had weight loss that one would expect. She has significant narrowing in her celiac artery. My recommendation is that she meet again with vascular surgery to discuss her pain and mesenteric vessel disease to see  if treatment of this high-grade narrowing may improve her abdominal pain. She is open to basilar surgery consult. I recommended that we continue amitriptyline 25 mg at night. She's been using this as needed and I explained that it would work better taken on schedule. I'm going to start pantoprazole 40 mg daily for her indigestion and mild nausea. This will be once daily before breakfast. After vascular surgery opinion if no surgical option exists or if they do not feel symptoms relate to mesenteric ischemia then I will recontact Dr. Harl Bowie.  2. Loose stools -- noninfectious and normal fecal elastase. Trial of Lomotil 1-2 tablets 4 times a day when necessary  Return in 8-12 weeks, sooner if necessary 25 minutes spent with the patient today. Greater than 50% was spent in counseling and coordination of care with the patient

## 2016-11-19 ENCOUNTER — Telehealth: Payer: Self-pay | Admitting: Internal Medicine

## 2016-11-19 NOTE — Telephone Encounter (Signed)
I was waiting to hear back from Dr. Ellyn Hack, but I would recommend she see Dr. Gwenlyn Found again to discuss her PVD, specifically celiac artery and SMA stenosis and ongoing episodic severe upper abd pain Referral back to Dr. Gwenlyn Found can be placed Thanks

## 2016-11-19 NOTE — Telephone Encounter (Signed)
Pt states she did not really like Dr. Gwenlyn Found. States she would like to wait a few days and see if Dr. Hilarie Fredrickson hears back from Dr. Ellyn Hack, she would really like to see someone different. Dr. Hilarie Fredrickson notified.

## 2016-11-19 NOTE — Telephone Encounter (Signed)
Left message for pt to call back.  Pt states she was supposed to be referred to another physician and she is calling to check on that referral.  Please advise.

## 2016-11-19 NOTE — Telephone Encounter (Signed)
Please refer to Dr. Trula Slade with vasc surgery

## 2016-11-20 ENCOUNTER — Other Ambulatory Visit: Payer: Self-pay

## 2016-11-20 ENCOUNTER — Other Ambulatory Visit: Payer: Self-pay | Admitting: Cardiology

## 2016-11-20 DIAGNOSIS — I739 Peripheral vascular disease, unspecified: Secondary | ICD-10-CM

## 2016-11-20 DIAGNOSIS — R1013 Epigastric pain: Secondary | ICD-10-CM

## 2016-11-20 NOTE — Telephone Encounter (Signed)
Referral placed in epic for pt to see Dr. Trula Slade. Pt knows to expect phone call from their office to schedule appt.

## 2016-12-12 ENCOUNTER — Encounter: Payer: Self-pay | Admitting: Surgery

## 2016-12-17 ENCOUNTER — Other Ambulatory Visit: Payer: Self-pay | Admitting: Surgery

## 2016-12-17 ENCOUNTER — Telehealth: Payer: Self-pay | Admitting: Cardiovascular Disease

## 2016-12-17 DIAGNOSIS — I739 Peripheral vascular disease, unspecified: Secondary | ICD-10-CM

## 2016-12-17 NOTE — Telephone Encounter (Signed)
Called the patient and she saw Dr. Trula Slade and thought she did not have to see Dr. Gwenlyn Found.  She is going to call Dr. Hilarie Fredrickson and see if she has to see Dr. Gwenlyn Found.

## 2016-12-18 ENCOUNTER — Telehealth: Payer: Self-pay | Admitting: Cardiology

## 2016-12-20 NOTE — Telephone Encounter (Signed)
Closed encounter °

## 2016-12-23 ENCOUNTER — Encounter (HOSPITAL_COMMUNITY): Payer: Self-pay

## 2016-12-23 ENCOUNTER — Ambulatory Visit (INDEPENDENT_AMBULATORY_CARE_PROVIDER_SITE_OTHER): Payer: Medicare Other | Admitting: Surgery

## 2016-12-23 ENCOUNTER — Ambulatory Visit (HOSPITAL_COMMUNITY)
Admission: RE | Admit: 2016-12-23 | Discharge: 2016-12-23 | Disposition: A | Discharge status: Home / Self Care | Payer: Medicare Other | Source: Ambulatory Visit | Attending: Surgery | Admitting: Surgery

## 2016-12-23 ENCOUNTER — Encounter: Payer: Self-pay | Admitting: Surgery

## 2016-12-23 VITALS — BP 129/74 | HR 90 | Temp 98.9°F | Resp 18 | Ht 68.0 in | Wt 230.0 lb

## 2016-12-23 DIAGNOSIS — I739 Peripheral vascular disease, unspecified: Secondary | ICD-10-CM

## 2016-12-23 DIAGNOSIS — I771 Stricture of artery: Secondary | ICD-10-CM

## 2016-12-23 DIAGNOSIS — I701 Atherosclerosis of renal artery: Secondary | ICD-10-CM

## 2016-12-23 DIAGNOSIS — I774 Celiac artery compression syndrome: Secondary | ICD-10-CM | POA: Diagnosis not present

## 2016-12-23 DIAGNOSIS — I87309 Chronic venous hypertension (idiopathic) without complications of unspecified lower extremity: Secondary | ICD-10-CM

## 2016-12-23 NOTE — Progress Notes (Signed)
[facility] [place]  Patient name: [name] MRN: [id number] DOB: 26-Jan-1941 Sex: female   REASON FOR VISIT:    celiac stenosis  HISOTRY OF PRESENT ILLNESS:    [name] is a 76 y.o. female returns today for follow-up.  I have not seen her since 2015.  She has a very, locating GI history and a long-standing history of abdominal pain.  She underwent cholecystectomy at age 76 she is been diagnosed with pancreatic divisum and has had stents placed 20 years ago at [facility].  She continues to have abdominal pain.  She has undergone multiple trials which have been unsuccessful with resolving her pain, and therefore she is considering intervention on her celiac stenosis.   PAST MEDICAL HISTORY:   Past Medical History:  Diagnosis Date  . Arthritis   . Atrial fibrillation ([place])   . Back pain with radiation    Chronic Back Pain - mutliple surgeries (including tumor removal)  . Bilateral edema of lower extremity    Chronic, likely related to venous stasis  . Blood transfusion without reported diagnosis   . CAD S/P percutaneous coronary angioplasty    a) LHC: 07/30/10. -- 3.0x26mm Integrity BMS to pRCA & 2.5x 79mm Integrity BMS mLAD(@ D2).  b) Class III-IV Angina 03/2011: LHC- ISR in LAD BMS -- prox overlapping Promus DES 2.5x60mm and PTCA of jailed D2 ostium-prox 80%. c) 02/03/12:  LHC- patent stents.  Jailed diagonal. with stable flow; d) Peri-OP NSTEMI 04/2012 - LHC in 12/'13 -   . Calculus of kidney   . Congestive heart failure with LV diastolic dysfunction, NYHA class 2 ([place])    06/13/10:  last 2D echo-  EF >55%, Mild TR, Mod Conc LVH - Grade 1 diastolic dysfunction (abnormal relaxation) --> LVEDP on Cath 28 mmHg & mild 2nd Pulm HTN  . Diabetes mellitus without complication ([place])   . Diverticulitis of colon (without mention of hemorrhage)(562.11)   . Dyslipidemia, goal LDL below 70    Intolerant to statins  . GERD  (gastroesophageal reflux disease)   . Hepatomegaly   . Hiatal hernia   . Irritable bowel syndrome (IBS)   . Labile essential hypertension    Partially related to RAS  . Mesenteric artery stenosis ([facility])    95% Celiac Artery - ostial, 20-30% SMA.  Bilateral Renal A: L RA stent patent, R RA 20-30% -- Conservative Management  . NSTEMI (non-ST elevated myocardial infarction) ([place]) 04/2012   Unclear the details, apparently this was postoperative from her back surgery that she was cleared for my last saw her in June.  Reportedly had stents placed  . Pancreas divisum    On pancreatic enzyme  . Renal artery stenosis ([place]) 2011; 12/'13   a) Angiogram 02/03/12:  50-60%L RA stenosis, 40% R R Inferior artery; b) 12/'13: S/P L RA Stent ([place] [facility]) 6.0 mm x 15 mm; c) Renal Duplex 10/2013: <60% L RA, <60 R RA, ~60% SMA & Celiac A.  . Stricture and stenosis of esophagus   . Ulcer   . Unspecified disorder of thyroid   . Unspecified gastritis and gastroduodenitis without mention of hemorrhage      FAMILY HISTORY:   Family History  Problem Relation Age of Onset  . Cancer Father     mets  . Heart attack Father   . Heart disease Father   . Stroke Mother   . Heart attack Brother   . Heart attack Brother   . Colitis  Maternal Grandfather   . Diabetes Brother   . Stroke Brother   . Heart attack Brother   . Colon cancer Neg Hx     SOCIAL HISTORY:   Social History  Substance Use Topics  . Smoking status: Never Smoker  . Smokeless tobacco: Never Used  . Alcohol use No     ALLERGIES:   Allergies  Allergen Reactions  . Codeine Phosphate Swelling  . Iodine Swelling and Rash    REACTION: unspecified This allergy is to Topical iodine only.   . Prednisone Itching and Rash  . Statins Other (See Comments)    Intolerant   . Tramadol Other (See Comments)    Kept patient awake     CURRENT MEDICATIONS:   Current Outpatient Prescriptions  Medication Sig Dispense Refill  . acetaminophen  (TYLENOL) 500 MG tablet Take 500 mg by mouth every 6 (six) hours as needed for mild pain or moderate pain.    Marland Kitchen amitriptyline (ELAVIL) 25 MG tablet Take 1 tablet (25 mg total) by mouth at bedtime. (Patient taking differently: Take 25 mg by mouth at bedtime as needed. ) 30 tablet 2  . carvedilol (COREG) 3.125 MG tablet TAKE ONE TABLET BY MOUTH TWICE DAILY 60 tablet 10  . clopidogrel (PLAVIX) 75 MG tablet Take 1 tablet (75 mg total) by mouth daily. 30 tablet 7  . furosemide (LASIX) 40 MG tablet Take 80 mg by mouth 2 (two) times daily.    Marland Kitchen gabapentin (NEURONTIN) 600 MG tablet Take 1,200 mg by mouth 3 (three) times daily.    . isosorbide mononitrate (IMDUR) 60 MG 24 hr tablet Take 1 tablet (60 mg total) by mouth daily. 90 tablet 3  . lidocaine (XYLOCAINE) 5 % ointment Apply 1 application topically daily.    Marland Kitchen losartan (COZAAR) 50 MG tablet Take 1 tablet (50 mg total) by mouth daily. 90 tablet 3  . metolazone (ZAROXOLYN) 2.5 MG tablet Take 1 tablet by mouth 30-60 minutes prior to morning dose of lasix AS NEEDED 30 tablet 5  . nitroGLYCERIN (NITROSTAT) 0.4 MG SL tablet DISSOLVE 1 TABLET UNDER THE TONGUE EVERY 5 MINUTES AS NEEDED FOR CHEST PAIN 25 tablet 4  . diphenoxylate-atropine (LOMOTIL) 2.5-0.025 MG tablet Take 1-2 four times daily as needed for diarrhea (Patient not taking: Reported on 12/23/2016) 240 tablet 3  . pantoprazole (PROTONIX) 40 MG tablet Take 1 tablet (40 mg total) by mouth daily. (Patient not taking: Reported on 12/23/2016) 30 tablet 3   No current facility-administered medications for this visit.     REVIEW OF SYSTEMS:   [X]  denotes positive finding, [ ]  denotes negative finding Cardiac  Comments:  Chest pain or chest pressure:    Shortness of breath upon exertion:    Short of breath when lying flat:    Irregular heart rhythm:        Vascular    Pain in calf, thigh, or hip brought on by ambulation:    Pain in feet at night that wakes you up from your sleep:     Blood clot in  your veins:    Leg swelling:         Pulmonary    Oxygen at home:    Productive cough:     Wheezing:         Neurologic    Sudden weakness in arms or legs:     Sudden numbness in arms or legs:     Sudden onset of difficulty speaking or slurred speech:  Temporary loss of vision in one eye:     Problems with dizziness:         Gastrointestinal    Blood in stool:     Vomited blood:         Genitourinary    Burning when urinating:     Blood in urine:        Psychiatric    Major depression:         Hematologic    Bleeding problems:    Problems with blood clotting too easily:        Skin    Rashes or ulcers:        Constitutional    Fever or chills:      PHYSICAL EXAM:   Vitals:   12/23/16 1044  BP: 129/74  Pulse: 90  Resp: 18  Temp: 98.9 F (37.2 C)  TempSrc: Oral  SpO2: 95%  Weight: 230 lb (104.3 kg)  Height: 5\' 8"  (1.727 m)    GENERAL: The patient is a well-nourished female, in no acute distress. The vital signs are documented above. CARDIAC: There is a regular rate and rhythm.  PULMONARY: Non-labored respirations ABDOMEN: Soft and non-tender  MUSCULOSKELETAL: There are no major deformities or cyanosis. NEUROLOGIC: No focal weakness or paresthesias are detected. SKIN: There are no ulcers or rashes noted. PSYCHIATRIC: The patient has a normal affect.  STUDIES:   I have reviewed her MRI.,  She continues to have celiac stenosis.  It does appear to be patent.  MEDICAL ISSUES:   Chronic abdominal pain.  I am not sure if her symptoms are the result of her celiac stenosis.  I do not think this is median arcuate ligament syndrome.  It could be secondary to ischemia from her celiac stenosis.  Because I feel that the patient has exhausted all medical options and has not had any relief in her symptoms, I feel it is reasonable to proceed with angiography and to intervene in the celiac artery if indicated.  The patient is going to call me to schedule the timing of  her procedure.  I did discuss the details of the procedure.  We also discussed long-term patency results and the possible need for surgical bypass if she has difficulty with her stenting.  She is aware that this may not help her symptoms.  All her questions were answered today.    Annamarie Major, MD [facility] [phone] Pager [phone]

## 2016-12-25 ENCOUNTER — Ambulatory Visit: Payer: Medicare Other | Admitting: Cardiovascular Disease

## 2016-12-31 ENCOUNTER — Other Ambulatory Visit: Payer: Self-pay

## 2016-12-31 HISTORY — PX: NM MYOVIEW LTD: HXRAD82

## 2017-01-07 ENCOUNTER — Other Ambulatory Visit: Payer: Self-pay | Admitting: *Deleted

## 2017-01-07 DIAGNOSIS — M940 Chondrocostal junction syndrome [Tietze]: Secondary | ICD-10-CM | POA: Diagnosis not present

## 2017-01-07 DIAGNOSIS — I10 Essential (primary) hypertension: Secondary | ICD-10-CM | POA: Diagnosis not present

## 2017-01-07 DIAGNOSIS — E669 Obesity, unspecified: Secondary | ICD-10-CM | POA: Diagnosis not present

## 2017-01-07 DIAGNOSIS — E1142 Type 2 diabetes mellitus with diabetic polyneuropathy: Secondary | ICD-10-CM | POA: Diagnosis not present

## 2017-01-07 MED ORDER — FUROSEMIDE 80 MG PO TABS: 80.0000 mg | ORAL_TABLET | Freq: Two times a day (BID) | ORAL | 3 refills | Status: DC

## 2017-01-07 NOTE — Telephone Encounter (Signed)
Spoke to patient . Aware switching medication to 80 mg twice a day of furosemide from 40 mg (2 tablets) which equal 80 mg  In the morning and 40 mg (2 tablets in the evening) which equal 80 mg.   90 day supply e-sent to pharmacy  With 3 refills Patient verbalized understanding.

## 2017-01-07 NOTE — Telephone Encounter (Signed)
HUSBAND IN OFFICE TODAY . SENT MESSAGE FROM PATIENT FUROSEMIDE NEEDS REFILLING.

## 2017-01-13 ENCOUNTER — Ambulatory Visit: Payer: Medicare Other | Admitting: Surgery

## 2017-01-13 ENCOUNTER — Encounter (HOSPITAL_COMMUNITY): Payer: Medicare Other

## 2017-01-13 ENCOUNTER — Telehealth: Payer: Self-pay | Admitting: Cardiology

## 2017-01-13 NOTE — Telephone Encounter (Signed)
Patient having procedure tomorrow and did not know about lipid clinic appointment Patient has multiple appointments coming up and would like to reschedule lipid clinic appointment for end of June Rescheduled as requested

## 2017-01-13 NOTE — Telephone Encounter (Signed)
New message   Pt husband calling to see If she needs to come to her appt on Thursday because she has catherization on Tuesday.

## 2017-01-14 ENCOUNTER — Telehealth: Payer: Self-pay | Admitting: Surgery

## 2017-01-14 ENCOUNTER — Encounter (HOSPITAL_COMMUNITY): Payer: Self-pay | Admitting: Surgery

## 2017-01-14 ENCOUNTER — Ambulatory Visit (HOSPITAL_COMMUNITY)
Admission: RE | Admit: 2017-01-14 | Discharge: 2017-01-15 | Disposition: A | Discharge status: Home / Self Care | Payer: Medicare Other | Source: Ambulatory Visit | Attending: Surgery | Admitting: Surgery

## 2017-01-14 ENCOUNTER — Other Ambulatory Visit: Payer: Self-pay | Admitting: *Deleted

## 2017-01-14 ENCOUNTER — Encounter (HOSPITAL_COMMUNITY)
Admission: RE | Disposition: A | Discharge status: Home / Self Care | Payer: Self-pay | Source: Ambulatory Visit | Attending: Surgery

## 2017-01-14 DIAGNOSIS — I5032 Chronic diastolic (congestive) heart failure: Secondary | ICD-10-CM | POA: Diagnosis not present

## 2017-01-14 DIAGNOSIS — I739 Peripheral vascular disease, unspecified: Secondary | ICD-10-CM | POA: Diagnosis present

## 2017-01-14 DIAGNOSIS — M549 Dorsalgia, unspecified: Secondary | ICD-10-CM | POA: Insufficient documentation

## 2017-01-14 DIAGNOSIS — I272 Pulmonary hypertension, unspecified: Secondary | ICD-10-CM | POA: Insufficient documentation

## 2017-01-14 DIAGNOSIS — R109 Unspecified abdominal pain: Secondary | ICD-10-CM | POA: Insufficient documentation

## 2017-01-14 DIAGNOSIS — E669 Obesity, unspecified: Secondary | ICD-10-CM | POA: Insufficient documentation

## 2017-01-14 DIAGNOSIS — Z6833 Body mass index (BMI) 33.0-33.9, adult: Secondary | ICD-10-CM | POA: Diagnosis not present

## 2017-01-14 DIAGNOSIS — I4891 Unspecified atrial fibrillation: Secondary | ICD-10-CM | POA: Insufficient documentation

## 2017-01-14 DIAGNOSIS — E039 Hypothyroidism, unspecified: Secondary | ICD-10-CM | POA: Diagnosis not present

## 2017-01-14 DIAGNOSIS — Z8719 Personal history of other diseases of the digestive system: Secondary | ICD-10-CM | POA: Insufficient documentation

## 2017-01-14 DIAGNOSIS — I774 Celiac artery compression syndrome: Secondary | ICD-10-CM | POA: Insufficient documentation

## 2017-01-14 DIAGNOSIS — E1142 Type 2 diabetes mellitus with diabetic polyneuropathy: Secondary | ICD-10-CM | POA: Insufficient documentation

## 2017-01-14 DIAGNOSIS — Z9862 Peripheral vascular angioplasty status: Secondary | ICD-10-CM

## 2017-01-14 DIAGNOSIS — Z7902 Long term (current) use of antithrombotics/antiplatelets: Secondary | ICD-10-CM | POA: Insufficient documentation

## 2017-01-14 DIAGNOSIS — I110 Hypertensive heart disease with heart failure: Secondary | ICD-10-CM | POA: Diagnosis not present

## 2017-01-14 DIAGNOSIS — Z883 Allergy status to other anti-infective agents status: Secondary | ICD-10-CM | POA: Insufficient documentation

## 2017-01-14 DIAGNOSIS — G8929 Other chronic pain: Secondary | ICD-10-CM | POA: Diagnosis not present

## 2017-01-14 DIAGNOSIS — I252 Old myocardial infarction: Secondary | ICD-10-CM | POA: Diagnosis not present

## 2017-01-14 DIAGNOSIS — Z955 Presence of coronary angioplasty implant and graft: Secondary | ICD-10-CM | POA: Insufficient documentation

## 2017-01-14 DIAGNOSIS — K909 Intestinal malabsorption, unspecified: Secondary | ICD-10-CM | POA: Insufficient documentation

## 2017-01-14 DIAGNOSIS — Z8249 Family history of ischemic heart disease and other diseases of the circulatory system: Secondary | ICD-10-CM | POA: Insufficient documentation

## 2017-01-14 DIAGNOSIS — I701 Atherosclerosis of renal artery: Secondary | ICD-10-CM | POA: Insufficient documentation

## 2017-01-14 DIAGNOSIS — K551 Chronic vascular disorders of intestine: Secondary | ICD-10-CM | POA: Diagnosis not present

## 2017-01-14 DIAGNOSIS — I2510 Atherosclerotic heart disease of native coronary artery without angina pectoris: Secondary | ICD-10-CM | POA: Insufficient documentation

## 2017-01-14 DIAGNOSIS — E1151 Type 2 diabetes mellitus with diabetic peripheral angiopathy without gangrene: Secondary | ICD-10-CM | POA: Diagnosis not present

## 2017-01-14 DIAGNOSIS — K580 Irritable bowel syndrome with diarrhea: Secondary | ICD-10-CM | POA: Diagnosis not present

## 2017-01-14 DIAGNOSIS — E785 Hyperlipidemia, unspecified: Secondary | ICD-10-CM | POA: Insufficient documentation

## 2017-01-14 DIAGNOSIS — M1990 Unspecified osteoarthritis, unspecified site: Secondary | ICD-10-CM | POA: Diagnosis not present

## 2017-01-14 DIAGNOSIS — K219 Gastro-esophageal reflux disease without esophagitis: Secondary | ICD-10-CM | POA: Insufficient documentation

## 2017-01-14 HISTORY — DX: Personal history of other medical treatment: Z92.89

## 2017-01-14 HISTORY — DX: Inflammatory liver disease, unspecified: K75.9

## 2017-01-14 HISTORY — DX: Personal history of urinary calculi: Z87.442

## 2017-01-14 HISTORY — DX: Personal history of other diseases of the digestive system: Z87.19

## 2017-01-14 HISTORY — DX: Adverse effect of unspecified anesthetic, initial encounter: T41.45XA

## 2017-01-14 HISTORY — PX: VISCERAL ANGIOGRAPHY: CATH118276

## 2017-01-14 HISTORY — DX: Other complications of anesthesia, initial encounter: T88.59XA

## 2017-01-14 HISTORY — DX: Personal history of peptic ulcer disease: Z87.11

## 2017-01-14 HISTORY — PX: PERIPHERAL VASCULAR INTERVENTION: CATH118257

## 2017-01-14 LAB — POCT I-STAT, CHEM 8
BUN: 23 mg/dL — AB (ref 6–20)
CHLORIDE: 92 mmol/L — AB (ref 101–111)
Calcium, Ion: 1.01 mmol/L — ABNORMAL LOW (ref 1.15–1.40)
Creatinine, Ser: 0.7 mg/dL (ref 0.44–1.00)
Glucose, Bld: 169 mg/dL — ABNORMAL HIGH (ref 65–99)
HEMATOCRIT: 35 % — AB (ref 36.0–46.0)
Hemoglobin: 11.9 g/dL — ABNORMAL LOW (ref 12.0–15.0)
Potassium: 2.8 mmol/L — ABNORMAL LOW (ref 3.5–5.1)
SODIUM: 136 mmol/L (ref 135–145)
TCO2: 31 mmol/L (ref 0–100)

## 2017-01-14 LAB — POCT ACTIVATED CLOTTING TIME
ACTIVATED CLOTTING TIME: 175 s
ACTIVATED CLOTTING TIME: 191 s
Activated Clotting Time: 235 seconds
Activated Clotting Time: 175 seconds

## 2017-01-14 LAB — GLUCOSE, CAPILLARY
GLUCOSE-CAPILLARY: 215 mg/dL — AB (ref 65–99)
GLUCOSE-CAPILLARY: 260 mg/dL — AB (ref 65–99)
Glucose-Capillary: 293 mg/dL — ABNORMAL HIGH (ref 65–99)

## 2017-01-14 SURGERY — VISCERAL ANGIOGRAPHY
Anesthesia: LOCAL

## 2017-01-14 MED ORDER — ISOSORBIDE MONONITRATE ER 60 MG PO TB24: 60.0000 mg | ORAL_TABLET | Freq: Every day | ORAL | Status: DC

## 2017-01-14 MED ORDER — MIDAZOLAM HCL 2 MG/2ML IJ SOLN
INTRAMUSCULAR | Status: AC
  Filled 2017-01-14: qty 2

## 2017-01-14 MED ORDER — SODIUM CHLORIDE 0.9 % IV SOLN: 500.0000 mL | Freq: Once | INTRAVENOUS | Status: DC | PRN

## 2017-01-14 MED ORDER — FENTANYL CITRATE (PF) 100 MCG/2ML IJ SOLN: 25.0000 ug | INTRAMUSCULAR | Status: DC | PRN

## 2017-01-14 MED ORDER — IODIXANOL 320 MG/ML IV SOLN
INTRAVENOUS | Status: DC | PRN
  Administered 2017-01-14: 115 mL via INTRAVENOUS

## 2017-01-14 MED ORDER — LABETALOL HCL 5 MG/ML IV SOLN
10.0000 mg | INTRAVENOUS | Status: DC | PRN
  Administered 2017-01-14: 10 mg via INTRAVENOUS

## 2017-01-14 MED ORDER — FAMOTIDINE IN NACL 20-0.9 MG/50ML-% IV SOLN
20.0000 mg | Freq: Once | INTRAVENOUS | Status: AC
  Administered 2017-01-14: 20 mg via INTRAVENOUS

## 2017-01-14 MED ORDER — NITROGLYCERIN 0.4 MG SL SUBL: 0.4000 mg | SUBLINGUAL_TABLET | SUBLINGUAL | Status: DC | PRN

## 2017-01-14 MED ORDER — MIDAZOLAM HCL 2 MG/2ML IJ SOLN
INTRAMUSCULAR | Status: DC | PRN
  Administered 2017-01-14 (×4): 1 mg via INTRAVENOUS

## 2017-01-14 MED ORDER — METOPROLOL TARTRATE 5 MG/5ML IV SOLN: 2.0000 mg | INTRAVENOUS | Status: DC | PRN

## 2017-01-14 MED ORDER — FENTANYL CITRATE (PF) 100 MCG/2ML IJ SOLN
INTRAMUSCULAR | Status: AC
Start: 2017-01-14 — End: 2017-01-14
  Filled 2017-01-14: qty 2

## 2017-01-14 MED ORDER — GUAIFENESIN-DM 100-10 MG/5ML PO SYRP: 15.0000 mL | ORAL_SOLUTION | ORAL | Status: DC | PRN

## 2017-01-14 MED ORDER — ALUM & MAG HYDROXIDE-SIMETH 200-200-20 MG/5ML PO SUSP: 15.0000 mL | ORAL | Status: DC | PRN

## 2017-01-14 MED ORDER — POTASSIUM CHLORIDE CRYS ER 20 MEQ PO TBCR
20.0000 meq | EXTENDED_RELEASE_TABLET | Freq: Once | ORAL | Status: AC
  Administered 2017-01-14: 20 meq via ORAL

## 2017-01-14 MED ORDER — DOCUSATE SODIUM 100 MG PO CAPS
100.0000 mg | ORAL_CAPSULE | Freq: Every day | ORAL | Status: DC
  Filled 2017-01-14: qty 1

## 2017-01-14 MED ORDER — FENTANYL CITRATE (PF) 100 MCG/2ML IJ SOLN
INTRAMUSCULAR | Status: DC | PRN
  Administered 2017-01-14 (×2): 50 ug via INTRAVENOUS
  Administered 2017-01-14: 25 ug via INTRAVENOUS
  Administered 2017-01-14: 50 ug via INTRAVENOUS

## 2017-01-14 MED ORDER — ACETAMINOPHEN 325 MG RE SUPP: 325.0000 mg | RECTAL | Status: DC | PRN

## 2017-01-14 MED ORDER — ACETAMINOPHEN 325 MG PO TABS
325.0000 mg | ORAL_TABLET | ORAL | Status: DC | PRN
  Administered 2017-01-14: 650 mg via ORAL
  Filled 2017-01-14: qty 2

## 2017-01-14 MED ORDER — DIPHENHYDRAMINE HCL 50 MG/ML IJ SOLN
25.0000 mg | Freq: Once | INTRAMUSCULAR | Status: AC
  Administered 2017-01-14: 25 mg via INTRAVENOUS

## 2017-01-14 MED ORDER — FUROSEMIDE 80 MG PO TABS
80.0000 mg | ORAL_TABLET | Freq: Two times a day (BID) | ORAL | Status: DC
  Administered 2017-01-14 – 2017-01-15 (×2): 80 mg via ORAL
  Filled 2017-01-14 (×2): qty 1

## 2017-01-14 MED ORDER — PHENOL 1.4 % MT LIQD: 1.0000 | OROMUCOSAL | Status: DC | PRN

## 2017-01-14 MED ORDER — HEPARIN SODIUM (PORCINE) 1000 UNIT/ML IJ SOLN
INTRAMUSCULAR | Status: AC
  Filled 2017-01-14: qty 1

## 2017-01-14 MED ORDER — FENTANYL CITRATE (PF) 100 MCG/2ML IJ SOLN
INTRAMUSCULAR | Status: AC
  Filled 2017-01-14: qty 2

## 2017-01-14 MED ORDER — DIPHENHYDRAMINE HCL 50 MG/ML IJ SOLN
INTRAMUSCULAR | Status: AC
Start: 2017-01-14 — End: 2017-01-14
  Administered 2017-01-14: 25 mg via INTRAVENOUS
  Filled 2017-01-14: qty 1

## 2017-01-14 MED ORDER — ISOSORBIDE MONONITRATE ER 30 MG PO TB24
30.0000 mg | ORAL_TABLET | Freq: Every day | ORAL | Status: DC
  Administered 2017-01-14 – 2017-01-15 (×2): 30 mg via ORAL
  Filled 2017-01-14 (×2): qty 1

## 2017-01-14 MED ORDER — LOSARTAN POTASSIUM 50 MG PO TABS
50.0000 mg | ORAL_TABLET | Freq: Every day | ORAL | Status: DC
  Administered 2017-01-14 – 2017-01-15 (×2): 50 mg via ORAL
  Filled 2017-01-14 (×2): qty 1

## 2017-01-14 MED ORDER — ACETAMINOPHEN 325 MG PO TABS
650.0000 mg | ORAL_TABLET | Freq: Three times a day (TID) | ORAL | Status: DC | PRN
  Administered 2017-01-14: 650 mg via ORAL
  Filled 2017-01-14: qty 2

## 2017-01-14 MED ORDER — HEPARIN SODIUM (PORCINE) 1000 UNIT/ML IJ SOLN
INTRAMUSCULAR | Status: DC | PRN
  Administered 2017-01-14: 1000 [IU] via INTRAVENOUS
  Administered 2017-01-14: 9000 [IU] via INTRAVENOUS
  Administered 2017-01-14 (×2): 1000 [IU] via INTRAVENOUS

## 2017-01-14 MED ORDER — DIPHENOXYLATE-ATROPINE 2.5-0.025 MG PO TABS: 1.0000 | ORAL_TABLET | Freq: Every day | ORAL | Status: DC | PRN

## 2017-01-14 MED ORDER — FENTANYL CITRATE (PF) 100 MCG/2ML IJ SOLN
25.0000 ug | Freq: Once | INTRAMUSCULAR | Status: AC
  Administered 2017-01-14: 25 ug via INTRAVENOUS

## 2017-01-14 MED ORDER — SODIUM CHLORIDE 0.9 % IV SOLN
1.0000 mL/kg/h | INTRAVENOUS | Status: DC
  Administered 2017-01-14: 1 mL/kg/h via INTRAVENOUS

## 2017-01-14 MED ORDER — LABETALOL HCL 5 MG/ML IV SOLN
INTRAVENOUS | Status: AC
  Filled 2017-01-14: qty 4

## 2017-01-14 MED ORDER — CLOPIDOGREL BISULFATE 75 MG PO TABS
75.0000 mg | ORAL_TABLET | Freq: Every day | ORAL | Status: DC
  Administered 2017-01-14 – 2017-01-15 (×2): 75 mg via ORAL
  Filled 2017-01-14 (×2): qty 1

## 2017-01-14 MED ORDER — POTASSIUM CHLORIDE CRYS ER 20 MEQ PO TBCR
EXTENDED_RELEASE_TABLET | ORAL | Status: AC
  Administered 2017-01-14: 20 meq via ORAL
  Filled 2017-01-14: qty 1

## 2017-01-14 MED ORDER — METHYLPREDNISOLONE SODIUM SUCC 125 MG IJ SOLR
125.0000 mg | Freq: Once | INTRAMUSCULAR | Status: AC
  Administered 2017-01-14: 125 mg via INTRAVENOUS

## 2017-01-14 MED ORDER — PANTOPRAZOLE SODIUM 40 MG PO TBEC
40.0000 mg | DELAYED_RELEASE_TABLET | Freq: Every day | ORAL | Status: DC
  Administered 2017-01-15: 40 mg via ORAL
  Filled 2017-01-14 (×2): qty 1

## 2017-01-14 MED ORDER — GABAPENTIN 600 MG PO TABS
1200.0000 mg | ORAL_TABLET | Freq: Three times a day (TID) | ORAL | Status: DC
  Administered 2017-01-14 – 2017-01-15 (×3): 1200 mg via ORAL
  Filled 2017-01-14 (×3): qty 2

## 2017-01-14 MED ORDER — SODIUM CHLORIDE 0.9 % IV SOLN
INTRAVENOUS | Status: DC
  Administered 2017-01-14: 07:00:00 via INTRAVENOUS

## 2017-01-14 MED ORDER — HYDRALAZINE HCL 20 MG/ML IJ SOLN: 5.0000 mg | INTRAMUSCULAR | Status: DC | PRN

## 2017-01-14 MED ORDER — FUROSEMIDE 80 MG PO TABS: 80.0000 mg | ORAL_TABLET | Freq: Two times a day (BID) | ORAL | Status: DC

## 2017-01-14 MED ORDER — CARVEDILOL 3.125 MG PO TABS: 3.1250 mg | ORAL_TABLET | Freq: Two times a day (BID) | ORAL | Status: DC

## 2017-01-14 MED ORDER — AMITRIPTYLINE HCL 25 MG PO TABS
25.0000 mg | ORAL_TABLET | Freq: Every day | ORAL | Status: DC
  Filled 2017-01-14: qty 1

## 2017-01-14 MED ORDER — LIDOCAINE HCL (PF) 1 % IJ SOLN
INTRAMUSCULAR | Status: DC | PRN
  Administered 2017-01-14: 30 mL

## 2017-01-14 MED ORDER — METHYLPREDNISOLONE SODIUM SUCC 125 MG IJ SOLR
INTRAMUSCULAR | Status: AC
  Administered 2017-01-14: 125 mg via INTRAVENOUS
  Filled 2017-01-14: qty 2

## 2017-01-14 MED ORDER — ONDANSETRON HCL 4 MG/2ML IJ SOLN: 4.0000 mg | Freq: Four times a day (QID) | INTRAMUSCULAR | Status: DC | PRN

## 2017-01-14 MED ORDER — HEPARIN (PORCINE) IN NACL 2-0.9 UNIT/ML-% IJ SOLN
INTRAMUSCULAR | Status: AC | PRN
  Administered 2017-01-14: 1000 mL

## 2017-01-14 MED ORDER — LIDOCAINE HCL 1 % IJ SOLN
INTRAMUSCULAR | Status: AC
  Filled 2017-01-14: qty 20

## 2017-01-14 MED ORDER — FAMOTIDINE IN NACL 20-0.9 MG/50ML-% IV SOLN
INTRAVENOUS | Status: AC
Start: 2017-01-14 — End: 2017-01-14
  Administered 2017-01-14: 20 mg via INTRAVENOUS
  Filled 2017-01-14: qty 50

## 2017-01-14 MED ORDER — CARVEDILOL 6.25 MG PO TABS: 6.2500 mg | ORAL_TABLET | Freq: Two times a day (BID) | ORAL | Status: DC

## 2017-01-14 MED ORDER — HEPARIN (PORCINE) IN NACL 2-0.9 UNIT/ML-% IJ SOLN
INTRAMUSCULAR | Status: AC
  Filled 2017-01-14: qty 1000

## 2017-01-14 SURGICAL SUPPLY — 27 items
BALLN ARMADA 4X20X135 (BALLOONS) ×3
BALLN MUSTANG 8X20X75 (BALLOONS) ×3
BALLOON ARMADA 4X20X135 (BALLOONS) IMPLANT
BALLOON MUSTANG 8X20X75 (BALLOONS) IMPLANT
CATH CROSS OVER TEMPO 5F (CATHETERS) ×1 IMPLANT
CATH OMNI FLUSH 5F 65CM (CATHETERS) ×1 IMPLANT
CATH QUICKCROSS SUPP .035X90CM (MICROCATHETER) ×1 IMPLANT
DEVICE TORQUE .025-.038 (MISCELLANEOUS) ×1 IMPLANT
DEVICE TORQUE H2O (MISCELLANEOUS) ×1 IMPLANT
GUIDE CATH VISTA IMA 6F (CATHETERS) ×1 IMPLANT
GUIDEWIRE ANGLED .035X260CM (WIRE) ×1 IMPLANT
KIT ENCORE 26 ADVANTAGE (KITS) ×2 IMPLANT
KIT MICROINTRODUCER STIFF 5F (SHEATH) ×2 IMPLANT
KIT PV (KITS) ×3 IMPLANT
SHEATH FLEXOR ANSEL 1 7F 45CM (SHEATH) ×1 IMPLANT
SHEATH HIGHFLEX ANSEL 7FR 55CM (SHEATH) ×1 IMPLANT
SHEATH PINNACLE 5F 10CM (SHEATH) ×1 IMPLANT
SHEATH PINNACLE 7F 10CM (SHEATH) ×1 IMPLANT
STENT OMNILINK ELITE 7X19X80 (Permanent Stent) ×1 IMPLANT
STOPCOCK MORSE 400PSI 3WAY (MISCELLANEOUS) ×1 IMPLANT
SYR MEDRAD MARK V 150ML (SYRINGE) ×3 IMPLANT
TRANSDUCER W/STOPCOCK (MISCELLANEOUS) ×3 IMPLANT
TRAY PV CATH (CUSTOM PROCEDURE TRAY) ×3 IMPLANT
TUBING CIL FLEX 10 FLL-RA (TUBING) ×1 IMPLANT
WIRE BENTSON .035X145CM (WIRE) ×1 IMPLANT
WIRE ROSEN-J .035X260CM (WIRE) ×2 IMPLANT
WIRE STABILIZER XS .014X180CM (WIRE) ×1 IMPLANT

## 2017-01-14 NOTE — H&P (View-Only) (Signed)
[facility] [place]  Patient name: [name] MRN: [id number] DOB: [birth year] Sex: female   REASON FOR VISIT:    celiac stenosis  HISOTRY OF PRESENT ILLNESS:    [name] is a 76 y.o. female returns today for follow-up.  I have not seen her since 2015.  She has a very, locating GI history and a long-standing history of abdominal pain.  She underwent cholecystectomy at age 19 she is been diagnosed with pancreatic divisum and has had stents placed 20 years ago at [facility].  She continues to have abdominal pain.  She has undergone multiple trials which have been unsuccessful with resolving her pain, and therefore she is considering intervention on her celiac stenosis.   PAST MEDICAL HISTORY:   Past Medical History:  Diagnosis Date  . Arthritis   . Atrial fibrillation ([place])   . Back pain with radiation    Chronic Back Pain - mutliple surgeries (including tumor removal)  . Bilateral edema of lower extremity    Chronic, likely related to venous stasis  . Blood transfusion without reported diagnosis   . CAD S/P percutaneous coronary angioplasty    a) LHC: 07/30/10. -- 3.0x70mm Integrity BMS to pRCA & 2.5x 54mm Integrity BMS mLAD(@ D2).  b) Class III-IV Angina 03/2011: LHC- ISR in LAD BMS -- prox overlapping Promus DES 2.5x27mm and PTCA of jailed D2 ostium-prox 80%. c) 02/03/12:  LHC- patent stents.  Jailed diagonal. with stable flow; d) Peri-OP NSTEMI 04/2012 - LHC in 12/'13 -   . Calculus of kidney   . Congestive heart failure with LV diastolic dysfunction, NYHA class 2 ([place])    06/13/10:  last 2D echo-  EF >55%, Mild TR, Mod Conc LVH - Grade 1 diastolic dysfunction (abnormal relaxation) --> LVEDP on Cath 28 mmHg & mild 2nd Pulm HTN  . Diabetes mellitus without complication ([place])   . Diverticulitis of colon (without mention of hemorrhage)(562.11)   . Dyslipidemia, goal LDL below 70    Intolerant to statins  . GERD  (gastroesophageal reflux disease)   . Hepatomegaly   . Hiatal hernia   . Irritable bowel syndrome (IBS)   . Labile essential hypertension    Partially related to RAS  . Mesenteric artery stenosis ([facility])    95% Celiac Artery - ostial, 20-30% SMA.  Bilateral Renal A: L RA stent patent, R RA 20-30% -- Conservative Management  . NSTEMI (non-ST elevated myocardial infarction) ([place]) 04/2012   Unclear the details, apparently this was postoperative from her back surgery that she was cleared for my last saw her in June.  Reportedly had stents placed  . Pancreas divisum    On pancreatic enzyme  . Renal artery stenosis ([place]) 2011; 12/'13   a) Angiogram 02/03/12:  50-60%L RA stenosis, 40% R R Inferior artery; b) 12/'13: S/P L RA Stent ([place] [facility]) 6.0 mm x 15 mm; c) Renal Duplex 10/2013: <60% L RA, <60 R RA, ~60% SMA & Celiac A.  . Stricture and stenosis of esophagus   . Ulcer   . Unspecified disorder of thyroid   . Unspecified gastritis and gastroduodenitis without mention of hemorrhage      FAMILY HISTORY:   Family History  Problem Relation Age of Onset  . Cancer Father     mets  . Heart attack Father   . Heart disease Father   . Stroke Mother   . Heart attack Brother   . Heart attack Brother   . Colitis  Maternal Grandfather   . Diabetes Brother   . Stroke Brother   . Heart attack Brother   . Colon cancer Neg Hx     SOCIAL HISTORY:   Social History  Substance Use Topics  . Smoking status: Never Smoker  . Smokeless tobacco: Never Used  . Alcohol use No     ALLERGIES:   Allergies  Allergen Reactions  . Codeine Phosphate Swelling  . Iodine Swelling and Rash    REACTION: unspecified This allergy is to Topical iodine only.   . Prednisone Itching and Rash  . Statins Other (See Comments)    Intolerant   . Tramadol Other (See Comments)    Kept patient awake     CURRENT MEDICATIONS:   Current Outpatient Prescriptions  Medication Sig Dispense Refill  . acetaminophen  (TYLENOL) 500 MG tablet Take 500 mg by mouth every 6 (six) hours as needed for mild pain or moderate pain.    Marland Kitchen amitriptyline (ELAVIL) 25 MG tablet Take 1 tablet (25 mg total) by mouth at bedtime. (Patient taking differently: Take 25 mg by mouth at bedtime as needed. ) 30 tablet 2  . carvedilol (COREG) 3.125 MG tablet TAKE ONE TABLET BY MOUTH TWICE DAILY 60 tablet 10  . clopidogrel (PLAVIX) 75 MG tablet Take 1 tablet (75 mg total) by mouth daily. 30 tablet 7  . furosemide (LASIX) 40 MG tablet Take 80 mg by mouth 2 (two) times daily.    Marland Kitchen gabapentin (NEURONTIN) 600 MG tablet Take 1,200 mg by mouth 3 (three) times daily.    . isosorbide mononitrate (IMDUR) 60 MG 24 hr tablet Take 1 tablet (60 mg total) by mouth daily. 90 tablet 3  . lidocaine (XYLOCAINE) 5 % ointment Apply 1 application topically daily.    Marland Kitchen losartan (COZAAR) 50 MG tablet Take 1 tablet (50 mg total) by mouth daily. 90 tablet 3  . metolazone (ZAROXOLYN) 2.5 MG tablet Take 1 tablet by mouth 30-60 minutes prior to morning dose of lasix AS NEEDED 30 tablet 5  . nitroGLYCERIN (NITROSTAT) 0.4 MG SL tablet DISSOLVE 1 TABLET UNDER THE TONGUE EVERY 5 MINUTES AS NEEDED FOR CHEST PAIN 25 tablet 4  . diphenoxylate-atropine (LOMOTIL) 2.5-0.025 MG tablet Take 1-2 four times daily as needed for diarrhea (Patient not taking: Reported on 12/23/2016) 240 tablet 3  . pantoprazole (PROTONIX) 40 MG tablet Take 1 tablet (40 mg total) by mouth daily. (Patient not taking: Reported on 12/23/2016) 30 tablet 3   No current facility-administered medications for this visit.     REVIEW OF SYSTEMS:   [X]  denotes positive finding, [ ]  denotes negative finding Cardiac  Comments:  Chest pain or chest pressure:    Shortness of breath upon exertion:    Short of breath when lying flat:    Irregular heart rhythm:        Vascular    Pain in calf, thigh, or hip brought on by ambulation:    Pain in feet at night that wakes you up from your sleep:     Blood clot in  your veins:    Leg swelling:         Pulmonary    Oxygen at home:    Productive cough:     Wheezing:         Neurologic    Sudden weakness in arms or legs:     Sudden numbness in arms or legs:     Sudden onset of difficulty speaking or slurred speech:  Temporary loss of vision in one eye:     Problems with dizziness:         Gastrointestinal    Blood in stool:     Vomited blood:         Genitourinary    Burning when urinating:     Blood in urine:        Psychiatric    Major depression:         Hematologic    Bleeding problems:    Problems with blood clotting too easily:        Skin    Rashes or ulcers:        Constitutional    Fever or chills:      PHYSICAL EXAM:   Vitals:   12/23/16 1044  BP: 129/74  Pulse: 90  Resp: 18  Temp: 98.9 F (37.2 C)  TempSrc: Oral  SpO2: 95%  Weight: 230 lb (104.3 kg)  Height: 5\' 8"  (1.727 m)    GENERAL: The patient is a well-nourished female, in no acute distress. The vital signs are documented above. CARDIAC: There is a regular rate and rhythm.  PULMONARY: Non-labored respirations ABDOMEN: Soft and non-tender  MUSCULOSKELETAL: There are no major deformities or cyanosis. NEUROLOGIC: No focal weakness or paresthesias are detected. SKIN: There are no ulcers or rashes noted. PSYCHIATRIC: The patient has a normal affect.  STUDIES:   I have reviewed her MRI.,  She continues to have celiac stenosis.  It does appear to be patent.  MEDICAL ISSUES:   Chronic abdominal pain.  I am not sure if her symptoms are the result of her celiac stenosis.  I do not think this is median arcuate ligament syndrome.  It could be secondary to ischemia from her celiac stenosis.  Because I feel that the patient has exhausted all medical options and has not had any relief in her symptoms, I feel it is reasonable to proceed with angiography and to intervene in the celiac artery if indicated.  The patient is going to call me to schedule the timing of  her procedure.  I did discuss the details of the procedure.  We also discussed long-term patency results and the possible need for surgical bypass if she has difficulty with her stenting.  She is aware that this may not help her symptoms.  All her questions were answered today.    Annamarie Major, MD [facility] [phone] Pager [phone]

## 2017-01-14 NOTE — Interval H&P Note (Signed)
History and Physical Interval Note:  01/14/2017 7:16 AM  [name]  has presented today for surgery, with the diagnosis of celiac stenosis and chronic abdominal pain  The various methods of treatment have been discussed with the patient and family. After consideration of risks, benefits and other options for treatment, the patient has consented to  Procedure(s): Mesenteric  Angiography (N/A) as a surgical intervention .  The patient's history has been reviewed, patient examined, no change in status, stable for surgery.  I have reviewed the patient's chart and labs.  Questions were answered to the patient's satisfaction.     Annamarie Major

## 2017-01-14 NOTE — Progress Notes (Signed)
25fr sheath aspirated and removed from rfa. Manual pressure applied for 20 minutes. Site immediately rebled. Manual pressure reapplied for additional 20 minutes. Site level 0 no S+S of hematoma. Tegaderm dressing applied, bedrest instructions given.    Bilateral  dp pulses palpable, some edema present in both feet.  Bedrest begins at 11:00:00

## 2017-01-14 NOTE — Op Note (Signed)
Patient name: [name] MRN: [id number] DOB: [birth year] Sex: female  01/14/2017 Pre-operative Diagnosis: Possible chronic mesenteric ischemia Post-operative diagnosis:  Same Surgeon:  Annamarie Major Procedure Performed:  1.  Ultrasound-guided access, right femoral artery  2.  Abdominal aortogram  3.  Celiac artery stent  4.  Conscious sedation (101 minutes)    Indications:  Patient has a long-standing history of abdominal pain.  She has exhausted all potential etiologies with the exception of a high-grade lesion/stenosis within her celiac artery.  Therefore, we have elected to come today for intervention.  Procedure:  The patient was identified in the holding area and taken to room 8.  The patient was then placed supine on the table and prepped and draped in the usual sterile fashion.  A time out was called.  Conscious sedation was administered with the use of IV fentanyl and Versed under continuous physician and nurse monitoring.  Heart rate, blood pressure, and oxygen saturation continuously monitored.  Ultrasound was used to evaluate the right common femoral artery.  It was patent .  A digital ultrasound image was acquired.  A micropuncture needle was used to access the right common femoral artery under ultrasound guidance.  An 018 wire was advanced without resistance and a micropuncture sheath was placed.  The 018 wire was removed and a benson wire was placed.  The micropuncture sheath was exchanged for a 5 french sheath.  An omniflush catheter was advanced over the wire to the level of L-1.  An abdominal angiogram was obtained in the lateral projection Findings:   Aortogram:  The visualized portions of the aorta are patent.  The origin of the celiac artery has a 90% stenosis with poststenotic dilatation.  The superior mesenteric artery has approximately 30% stenosis.   Intervention:  After the above images were acquired the decision was made to proceed with intervention.  The  patient was fully heparinized.  I initially inserted a 7 x 45 Ansell 1 sheath.  The celiac artery lesion was successfully crossed using a IM catheter.  I was able to navigate a Glidewire out into the splenic artery.  A quick cross catheter was advanced over this.  The Glidewire was removed and a Rosen wire was inserted.  I then tried to advance the 745 sheath out into the celiac artery, however I could not get across the calcified origin.  I then removed the 745 Ansell 1 sheath and tried a 755 high flex Ansell 1 sheath.  Again I had difficulty getting across the origin of the celiac artery.  I then replaced the 755 high flex sheath for a 7 French short sheath.  I used a IM guide catheter and placed this in the celiac artery followed by a stabilizer wire.  Again I could not get across the origin of the celiac artery.  As one last final attempt I went back to the [facility] wire out the splenic artery and the 7 French 55 cm high flex sheath.  With extreme difficulty, I was able to finally advance the sheath out across the lesion.  I then selected a 7 x 19 Omni link elete balloon expandable stent.  This was positioned across the lesion and the balloon was taken to nominal pressure.  Completion imaging revealed resolution of the stenosis.  Because of the plaque at the origin of the celiac artery, the stent was deflected slightly superior.  Therefore, I inserted an 8 x 20 balloon and placed this halfway into the stent and  took this to nominal pressure trying to trumpet the bottom portion of the stent.  Final imaging showed slightly improved results and continued wide patency of the stent.  At this point, catheters and wires removed.  The patient taken the holding area for sheath pull once correlation profile corrects  Impression:  #1  approximately 90% celiac artery stenosis successfully treated using a 7 x 19 balloon expandable stent with no residual stenosis.  #2  the portion of the stent within the aorta was  deflected slightly superior because of an ostial plaque.  I could not manipulate the stent to face a downward fashion, therefore if the patient requires future interventions, this will need to be done from the brachial approach  #3  approximate 30% stenosis within the superior mesenteric artery    V. Annamarie Major, M.D. [facility] [place] Office: [phone] Pager:  [phone]

## 2017-01-14 NOTE — Progress Notes (Signed)
Site area: Right groin a 7 french arterial sheath was removed by [facility]  Site Prior to Removal:  Level 0  Pressure Applied For 30 MINUTES    Bedrest Beginning at   Manual:   Yes.    Patient Status During Pull:  stable  Post Pull Groin Site:  Level 0  Post Pull Instructions Given:  Yes.    Post Pull Pulses Present:  Yes.    Dressing Applied:  Yes.    Comments:  VS remain stable during  Sheath pull

## 2017-01-14 NOTE — Telephone Encounter (Signed)
Sched lab 02/25/17 at 8:00 and MD 03/03/17 at 9:15. Lm on hm# for pt to confirm appts.

## 2017-01-14 NOTE — Telephone Encounter (Signed)
-----   Message from Mena Goes, RN sent at 01/14/2017  9:40 AM EDT ----- Regarding: 4-6 weeks w/ Duplex and VWB appt   ----- Message ----- From: Serafina Mitchell, MD Sent: 01/14/2017   9:31 AM To: Vvs Charge Pool  01/14/2017:  Surgeon:  Annamarie Major Procedure Performed:  1.  Ultrasound-guided access, right femoral artery  2.  Abdominal aortogram  3.  Celiac artery stent  4.  Conscious sedation (101 minutes) Follow-up 46 weeks with mesenteric duplex.  Please have her see me

## 2017-01-15 ENCOUNTER — Telehealth: Payer: Self-pay | Admitting: Surgery

## 2017-01-15 ENCOUNTER — Ambulatory Visit: Payer: Medicare Other | Admitting: Internal Medicine

## 2017-01-15 DIAGNOSIS — E1151 Type 2 diabetes mellitus with diabetic peripheral angiopathy without gangrene: Secondary | ICD-10-CM | POA: Diagnosis not present

## 2017-01-15 DIAGNOSIS — K551 Chronic vascular disorders of intestine: Secondary | ICD-10-CM | POA: Diagnosis not present

## 2017-01-15 DIAGNOSIS — I774 Celiac artery compression syndrome: Secondary | ICD-10-CM | POA: Diagnosis not present

## 2017-01-15 DIAGNOSIS — G8929 Other chronic pain: Secondary | ICD-10-CM | POA: Diagnosis not present

## 2017-01-15 DIAGNOSIS — M549 Dorsalgia, unspecified: Secondary | ICD-10-CM | POA: Diagnosis not present

## 2017-01-15 DIAGNOSIS — R109 Unspecified abdominal pain: Secondary | ICD-10-CM | POA: Diagnosis not present

## 2017-01-15 LAB — GLUCOSE, CAPILLARY: GLUCOSE-CAPILLARY: 159 mg/dL — AB (ref 65–99)

## 2017-01-15 MED ORDER — ACETAMINOPHEN 325 MG PO TABS
650.0000 mg | ORAL_TABLET | Freq: Three times a day (TID) | ORAL | Status: DC | PRN
Start: 2017-01-15 — End: 2017-09-04

## 2017-01-15 NOTE — Discharge Summary (Signed)
[facility] Discharge Summary  DELROSE ROHWER 1941-08-09 76 y.o. female  [id number]  Admission Date: 01/14/2017  Discharge Date: 01/15/2017  Physician: Annamarie Major, MD  Admission Diagnosis: PAD (peripheral artery disease) ([place]) [I73.9]  HPI:   This is a 75 y.o. female who returned for follow-up regarding abdominal pain.  Dr. Trula Slade had not seen her since 2015.  She has a very, long standing GI history and a long-standing history of abdominal pain.  She underwent cholecystectomy at age 31 she is been diagnosed with pancreatic divisum and has had stents placed 20 years ago at [facility].  She continues to have abdominal pain.  She has undergone multiple trials which have been unsuccessful with resolving her pain, and therefore she is considering intervention on her celiac stenosis.  Hospital Course:  The patient was admitted to the hospital and taken to the operating room on 01/14/2017 and underwent:    1.  Ultrasound-guided access, right femoral artery   2.  Abdominal aortogram   3.  Celiac artery stent   4.  Conscious sedation (101 minutes)    Impression:             #1  approximately 90% celiac artery stenosis successfully treated using a 7 x 19 balloon expandable stent with no residual stenosis.             #2  the portion of the stent within the aorta was deflected slightly superior because of an ostial plaque.  I could not manipulate the stent to face a downward fashion, therefore if the patient requires future interventions, this will need to be done from the brachial approach             #3  approximate 30% stenosis within the superior mesenteric artery               The patient tolerated the procedure well and was transported to the recovery area in stable condition.   She continued to have abdominal pain on POD 1. Her right groin sheath site was without hematoma. She was already on Plavix. She was discharged home on POD 1 in good condition. She will follow  up in 1 month with mesenteric duplex.  CBC    Component Value Date/Time   WBC 9.6 10/10/2016 1215   RBC 4.61 10/10/2016 1215   HGB 11.9 (L) 01/14/2017 0644   HCT 35.0 (L) 01/14/2017 0644   PLT 242.0 10/10/2016 1215   MCV 86.5 10/10/2016 1215   MCH 28.6 03/16/2015 0609   MCHC 33.6 10/10/2016 1215   RDW 13.5 10/10/2016 1215   LYMPHSABS 2.1 10/10/2016 1215   MONOABS 0.9 10/10/2016 1215   EOSABS 0.1 10/10/2016 1215   BASOSABS 0.1 10/10/2016 1215    BMET    Component Value Date/Time   NA 136 01/14/2017 0644   K 2.8 (L) 01/14/2017 0644   CL 92 (L) 01/14/2017 0644   CO2 31 10/24/2016 1127   GLUCOSE 169 (H) 01/14/2017 0644   BUN 23 (H) 01/14/2017 0644   CREATININE 0.70 01/14/2017 0644   CREATININE 0.90 10/24/2016 1127   CALCIUM 9.7 10/24/2016 1127   GFRNONAA >60 03/16/2015 0609   GFRAA >60 03/16/2015 0609     Discharge Instructions:   The patient is discharged to home with extensive instructions on wound care and progressive ambulation.  They are instructed not to drive or perform any heavy lifting until returning to see the physician in his office.  Discharge Instructions    Activity  as tolerated - No restrictions    Complete by:  As directed    Call MD for:  redness, tenderness, or signs of infection (pain, swelling, bleeding, redness, odor or green/yellow discharge around incision site)    Complete by:  As directed    Call MD for:  severe or increased pain, loss or decreased feeling  in affected limb(s)    Complete by:  As directed    Call MD for:  temperature >100.5    Complete by:  As directed    Discharge wound care:    Complete by:  As directed    Shower daily. Wash right groin daily with soap and water. You do not have to reapply a dressing.   Driving Restrictions    Complete by:  As directed    No driving if having any right groin pain.   Lifting restrictions    Complete by:  As directed    No lifting for 1 week   Resume previous diet    Complete by:  As  directed       Discharge Diagnosis:  PAD (peripheral artery disease) ([place]) [I73.9]  Secondary Diagnosis: Patient Active Problem List   Diagnosis Date Noted  . PAD (peripheral artery disease) ([place]) 01/14/2017  . Continuous severe abdominal pain 10/11/2016  . RUQ abdominal pain 10/11/2016  . Type 2 diabetes mellitus with peripheral neuropathy ([place]) 10/09/2016  . Hammer toe of right foot 11/16/2015  . Onychomycosis due to dermatophyte 11/16/2015  . Exertional dyspnea 03/16/2015  . Abnormal electrocardiogram during exercise stress test 03/15/2015  . Pre-operative cardiovascular examination, new EKG abnormalities c/w ischemia 03/15/2015  . CAD (coronary artery disease) 03/07/2015  . Obesity (BMI 30.0-34.9) 06/10/2014  . Mesenteric artery stenosis ([place]) 04/20/2014  . Idiopathic chronic pancreatitis suspected 03/10/2014  . Abnormal CT of liver-possible cirrhosis 03/10/2014  . Dysphagia 03/10/2014  . Long term current use of antithrombotics/antiplatelets - clopidogrel 03/10/2014  . Bruit of right carotid artery 02/27/2014  . Epigastric pain 01/19/2014  . History of pancreatitis 01/19/2014  . Leg cramps 01/19/2014  . Palpitation 01/12/2014  . Stasis edema of bilateral lower extremity   . Congestive heart failure with LV diastolic dysfunction, NYHA class 2 ([place])   . Essential hypertension   . Dyslipidemia, goal LDL below 70 - WITH STATIN INTOLERANCE   . Celiac artery stenosis: 60% by Duplex - 90-95% by cath - Med Rx 11/27/2013  . Renal artery stenosis ([place]) 08/29/2012  . MI (myocardial infarction) ([place]) 04/02/2012  . CAD S/P percutaneous coronary angioplasty: pRCA BMS, mLAD BMS overlapped prox with DES for ISR 07/30/2010  . GERD (gastroesophageal reflux disease) 01/24/2009  . Diarrhea due to malabsorption 01/24/2009  . Hypothyroid 11/12/2007  . CARDIAC ARRHYTHMIA 11/12/2007  . Irritable bowel syndrome 11/12/2007  . RENAL CALCULUS 11/12/2007  . PANCREAS DIVISUM 11/12/2007  .  HIATAL HERNIA 11/20/2005   Past Medical History:  Diagnosis Date  . Arthritis    "middle finger right hand" (01/14/2017)  . Atrial fibrillation ([place])   . Back pain with radiation    Chronic Back Pain - mutliple surgeries (including tumor removal)  . Bilateral edema of lower extremity    Chronic, likely related to venous stasis  . CAD S/P percutaneous coronary angioplasty    a) LHC: 07/30/10. -- 3.0x23mm Integrity BMS to pRCA & 2.5x 62mm Integrity BMS mLAD(@ D2).  b) Class III-IV Angina 03/2011: LHC- ISR in LAD BMS -- prox overlapping Promus DES 2.5x8mm and PTCA of jailed D2 ostium-prox 80%.  c) 02/03/12:  LHC- patent stents.  Jailed diagonal. with stable flow; d) Peri-OP NSTEMI 04/2012 - LHC in 12/'13 -   . Complication of anesthesia    "used to wake up wild" (01/14/2017)  . Congestive heart failure with LV diastolic dysfunction, NYHA class 2 ([place])    06/13/10:  last 2D echo-  EF >55%, Mild TR, Mod Conc LVH - Grade 1 diastolic dysfunction (abnormal relaxation) --> LVEDP on Cath 28 mmHg & mild 2nd Pulm HTN  . Diverticulitis of colon (without mention of hemorrhage)(562.11)   . Dyslipidemia, goal LDL below 70    Intolerant to statins  . GERD (gastroesophageal reflux disease)   . Hepatitis ~ 1957   "yellow jaundice" (01/14/2017)  . Hepatomegaly   . Hiatal hernia   . History of blood transfusion    "when I had a heart attack"  . History of kidney stones   . History of stomach ulcers "years ago"  . Irritable bowel syndrome (IBS)   . Labile essential hypertension    Partially related to RAS  . Mesenteric artery stenosis ([facility])    95% Celiac Artery - ostial, 20-30% SMA.  Bilateral Renal A: L RA stent patent, R RA 20-30% -- Conservative Management  . NSTEMI (non-ST elevated myocardial infarction) ([place]) 04/2012   Unclear the details, apparently this was postoperative from her back surgery that she was cleared for my last saw her in June.  Reportedly had stents placed  . Pancreas divisum    On  pancreatic enzyme  . Renal artery stenosis ([place]) 2011; 12/'13   a) Angiogram 02/03/12:  50-60%L RA stenosis, 40% R R Inferior artery; b) 12/'13: S/P L RA Stent ([place] [facility] [facility]) 6.0 mm x 15 mm; c) Renal Duplex 10/2013: <60% L RA, <60 R RA, ~60% SMA & Celiac A.  . Stricture and stenosis of esophagus   . Type II diabetes mellitus ([place])    "borderline; not on RX" (01/14/2017)  . Unspecified disorder of thyroid   . Unspecified gastritis and gastroduodenitis without mention of hemorrhage      Allergies as of 01/15/2017      Reactions   Codeine Phosphate Anaphylaxis, Shortness Of Breath, Swelling   Iodine Swelling, Rash   REACTION: unspecified This allergy is to Topical iodine only.    Prednisone Itching, Rash   Keeps pt up all night    Statins Other (See Comments)   Intolerant   Tramadol Other (See Comments)   Kept patient awake      Medication List    TAKE these medications   acetaminophen 325 MG tablet Commonly known as:  TYLENOL Take 2 tablets (650 mg total) by mouth 3 (three) times daily as needed for mild pain or moderate pain. What changed:  medication strength  how much to take   amitriptyline 25 MG tablet Commonly known as:  ELAVIL Take 1 tablet (25 mg total) by mouth at bedtime. What changed:  when to take this  reasons to take this   carvedilol 3.125 MG tablet Commonly known as:  COREG TAKE ONE TABLET BY MOUTH TWICE DAILY   carvedilol 6.25 MG tablet Commonly known as:  COREG Take 6.25 mg by mouth 2 (two) times daily.   clopidogrel 75 MG tablet Commonly known as:  PLAVIX Take 1 tablet (75 mg total) by mouth daily.   diphenoxylate-atropine 2.5-0.025 MG tablet Commonly known as:  LOMOTIL Take 1-2 four times daily as needed for diarrhea What changed:  how much to take  how to  take this  when to take this  reasons to take this  additional instructions   furosemide 40 MG tablet Commonly known as:  LASIX Take 80 mg by mouth 2 (two) times daily.     furosemide 80 MG tablet Commonly known as:  LASIX Take 1 tablet (80 mg total) by mouth 2 (two) times daily.   gabapentin 600 MG tablet Commonly known as:  NEURONTIN Take 1,200 mg by mouth 3 (three) times daily.   isosorbide mononitrate 30 MG 24 hr tablet Commonly known as:  IMDUR Take 30 mg by mouth daily.   isosorbide mononitrate 60 MG 24 hr tablet Commonly known as:  IMDUR Take 1 tablet (60 mg total) by mouth daily.   lidocaine 5 % ointment Commonly known as:  XYLOCAINE Apply 1 application topically at bedtime.   losartan 50 MG tablet Commonly known as:  COZAAR Take 1 tablet (50 mg total) by mouth daily.   metolazone 2.5 MG tablet Commonly known as:  ZAROXOLYN Take 1 tablet by mouth 30-60 minutes prior to morning dose of lasix AS NEEDED   nitroGLYCERIN 0.4 MG SL tablet Commonly known as:  NITROSTAT DISSOLVE 1 TABLET UNDER THE TONGUE EVERY 5 MINUTES AS NEEDED FOR CHEST PAIN   pantoprazole 40 MG tablet Commonly known as:  PROTONIX Take 1 tablet (40 mg total) by mouth daily.       Disposition: home  Patient's condition: is Good  Follow up: 1. Dr. Trula Slade in 4 weeks   Virgina Jock, PA-C [facility] [phone] 01/15/2017  12:44 PM

## 2017-01-15 NOTE — Telephone Encounter (Signed)
Sched lab 02/26/17 at 10:00. Lm on hm# for pt to confirm appt.

## 2017-01-15 NOTE — Progress Notes (Addendum)
  [facility] Progress Note  Subjective  - POD #1  Still having pain right abdomen  Objective Vitals:   01/14/17 1943 01/15/17 0530  BP: (!) 124/41 (!) 111/45  Pulse: 88 88  Resp: 20 19  Temp: 98.2 F (36.8 C) 97.7 F (36.5 C)    Intake/Output Summary (Last 24 hours) at 01/15/17 5929 Last data filed at 01/14/17 1032  Gross per 24 hour  Intake                0 ml  Output              400 ml  Net             -400 ml   Right groin without hematoma Right foot warm Abdomen soft with tenderness to RUQ and RLQ  Assessment/Planning: 76 y.o. female is s/p: abdominal aortogram and celiac artery stenting 1 Day Post-Op   Still having same abdominal pain. Patient understood that celiac stenosis may not have been etiology prior to intervention. Creatinine ok. D/c home this am. F/u in 4-6 weeks with mesenteric duplex.  Alvia Grove 01/15/2017 7:17 AM --  Laboratory CBC    Component Value Date/Time   WBC 9.6 10/10/2016 1215   HGB 11.9 (L) 01/14/2017 0644   HCT 35.0 (L) 01/14/2017 0644   PLT 242.0 10/10/2016 1215    BMET    Component Value Date/Time   NA 136 01/14/2017 0644   K 2.8 (L) 01/14/2017 0644   CL 92 (L) 01/14/2017 0644   CO2 31 10/24/2016 1127   GLUCOSE 169 (H) 01/14/2017 0644   BUN 23 (H) 01/14/2017 0644   CREATININE 0.70 01/14/2017 0644   CREATININE 0.90 10/24/2016 1127   CALCIUM 9.7 10/24/2016 1127   GFRNONAA >60 03/16/2015 0609   GFRAA >60 03/16/2015 0609    COAG Lab Results  Component Value Date   INR 0.97 03/16/2015   INR 0.94 05/06/2014   INR 0.9 03/09/2008   No results found for: PTT  Antibiotics Anti-infectives    None       Virgina Jock, PA-C [facility] Office: [phone] Pager: [phone] 01/15/2017 7:17 AM    OK to d/c.  Will have f/u in 1 month.  Already on Plavix  WB

## 2017-01-15 NOTE — Telephone Encounter (Signed)
-----   Message from Mena Goes, RN sent at 01/15/2017  9:19 AM EDT ----- Regarding: add reflux   ----- Message ----- From: Serafina Mitchell, MD Sent: 01/15/2017   8:41 AM To: [facility] Charge Pool  Add venous reflux exam to follow up in 1 month to see me

## 2017-01-16 ENCOUNTER — Telehealth: Payer: Self-pay | Admitting: Cardiology

## 2017-01-16 ENCOUNTER — Ambulatory Visit: Payer: Medicare Other

## 2017-01-16 NOTE — Telephone Encounter (Signed)
Patient requesting call from [place]. Patient would not go into details. Thanks.

## 2017-01-16 NOTE — Telephone Encounter (Signed)
Spoke to patient states she would like an appointment.  she states she had some chest discomfort @2  noght ago.  She had refuse to got to hospital when husband suggested.  patient states she did not use nitroglycerin sublingual.PATIENT STATES WAS NOT SURE WHEN TO TAKE MEDICATIONS.  RN instructed patient how to take use medication. Appointment made. Instruction given if symptoms become worse and medication does not relieve symptoms.call 911 and go to ER.  PATIENT VERBALIZED UNDERSTANDING

## 2017-01-21 ENCOUNTER — Encounter: Payer: Self-pay | Admitting: Cardiology

## 2017-01-21 ENCOUNTER — Ambulatory Visit (INDEPENDENT_AMBULATORY_CARE_PROVIDER_SITE_OTHER): Payer: Medicare Other | Admitting: Cardiology

## 2017-01-21 VITALS — BP 152/64 | HR 90 | Ht 68.0 in | Wt 227.0 lb

## 2017-01-21 DIAGNOSIS — R002 Palpitations: Secondary | ICD-10-CM | POA: Diagnosis not present

## 2017-01-21 DIAGNOSIS — I701 Atherosclerosis of renal artery: Secondary | ICD-10-CM | POA: Diagnosis not present

## 2017-01-21 DIAGNOSIS — Z9861 Coronary angioplasty status: Secondary | ICD-10-CM

## 2017-01-21 DIAGNOSIS — E785 Hyperlipidemia, unspecified: Secondary | ICD-10-CM | POA: Diagnosis not present

## 2017-01-21 DIAGNOSIS — I10 Essential (primary) hypertension: Secondary | ICD-10-CM

## 2017-01-21 DIAGNOSIS — I5030 Unspecified diastolic (congestive) heart failure: Secondary | ICD-10-CM | POA: Diagnosis not present

## 2017-01-21 DIAGNOSIS — E669 Obesity, unspecified: Secondary | ICD-10-CM | POA: Diagnosis not present

## 2017-01-21 DIAGNOSIS — I2510 Atherosclerotic heart disease of native coronary artery without angina pectoris: Secondary | ICD-10-CM | POA: Diagnosis not present

## 2017-01-21 DIAGNOSIS — R079 Chest pain, unspecified: Secondary | ICD-10-CM | POA: Insufficient documentation

## 2017-01-21 DIAGNOSIS — M940 Chondrocostal junction syndrome [Tietze]: Secondary | ICD-10-CM | POA: Diagnosis not present

## 2017-01-21 DIAGNOSIS — R0609 Other forms of dyspnea: Secondary | ICD-10-CM

## 2017-01-21 MED ORDER — METOLAZONE 2.5 MG PO TABS: ORAL_TABLET | ORAL | 6 refills | Status: DC

## 2017-01-21 NOTE — Assessment & Plan Note (Signed)
Last cath was in July 2016 showing patent stents in the LAD and RCA with moderate disease in the diagonal branch and OM branch. As with a prolonged episode of chest pain that she was concerned may been a heart attack. No longer having that symptom, not a saline exacerbated by exertion. She did have a prolonged episode of irregular heart beating after that which makes me concerned for possible arrhythmia. Plan: Lexiscan Myoview. Continue current management with carvedilol - she is actually taking the 6.25 mg twice a day (was unable to tolerate further titration). Also on Imdur 60 mg not 30 mg. On ARB stable dose - Remains on clopidogrel without aspirin - especially in light of her recent celiac stent

## 2017-01-21 NOTE — Assessment & Plan Note (Signed)
Chronic get stable. We are evaluating with a Myoview

## 2017-01-21 NOTE — Assessment & Plan Note (Signed)
Her prolonged episode of chest pain is somewhat concerning for possible cardiac etiology especially since it was then followed by abnormal heartbeats. Plan: Lexiscan Myoview.

## 2017-01-21 NOTE — Assessment & Plan Note (Signed)
Overall stable. Blood pressure is not as it is unlikely to be but she does not tolerate higher dose of medicines. I think we can probably come out of from a different angle and use diltiazem for blood pressure control, rate control and antianginal effect.. If that is unaffected would also have room to titrate up losartan.  Continues on standing dose of Lasix 80 twice a day (partly related to venous stasis) with standing dose of Zaroxolyn. We will provide additional Zaroxolyn for her to be on take 5 mg dose was at 2.5 mg if her edema is worse or weight gain is increased at 3 pounds.

## 2017-01-21 NOTE — Assessment & Plan Note (Signed)
I think a lot of the symptoms she has along her right costal margin could be costochondritis. She also has some point tenderness along the left sternal border as well. This would be consistent with cost and redness. I think we can do a short course taper of NSAIDs: 800 mg 3 times a day for 2 days, 6 mm 3 times a day for 2 days, 400 mg 3 times a day 2 days, 200 mg 3 times a day 2 days. Taken with food and adequate hydration.

## 2017-01-21 NOTE — Assessment & Plan Note (Signed)
Separately episode of irregular heartbeat that would get faster with exertion. This is somewhat concerning for atrial fibrillation. - Especially considering that she had discomfort when her heart rate when fast.  Plan: 30 monitor to reassess. Regardless of results, the next step would be to start diltiazem for combination rate control/and hypertension control as well as antianginal control.

## 2017-01-21 NOTE — Progress Notes (Signed)
PCP: Tricia Glee., MD  Clinic Note: Chief Complaint  Patient presents with  . Follow-up    chest discomfort about a week ago, with some sweating, and heart was skipping beats, doing better today   . Coronary Artery Disease    HPI: [name] is a 75 y.o. female with a PMH below who presents today for two-month follow-up/work in visit for chest pain. She has history of known chronic diastolic dysfunction as complication of CAD and PAD:.  CAD: pRCA & mLAD x 2 in 2011 (BMS stents used for preop back surgery), DES x 2 LAD for ISR  PAD: - RAS - s/p renal stent; celiac/SMA stenosis s/p SMA stent (recent) She also has difficult control lipids with statin intolerance and refusal to take statin. Unable to afford Lovaza. Diuresis level is well-controlled on metolazone.   Due for f/u in [facility] ([facility] in [place]) - for lipid management.  [name] was last seen on 11/05/2016 - this was in follow-up for worsening edema with issues obtaining metolazone. Now is back on metolazone. He was noting exertional dyspnea but otherwise doing okay.  Recent Hospitalizations:   01/14/2017 Celiac angiography with stenting (Dr. Annamarie Major) -  7 x 19 Omni link elete balloon expandable stent.   Studies Personally Reviewed - (if available, images/films reviewed: From Epic Chart or Care Everywhere)  n/a  Interval History: [name] presents as work in patient today to evaluate a prolonged episode of chest pain on the evening following her discharge last week (Wednesday, May 16) that was then followed by a prolonged spell of irregular heartbeats palpitations and intermittent chest pain that lasted until late Saturday. Currently that has improved, but she still has her baseline dyspnea. She is exiting the fact that her PCP has given her a diagnosis to explain her right-sided flank pain (based on his notes likely costochondritis). On the  night following her discharge which was last Wednesday, she had a prolonged episode of excruciating chest pain that almost caused her to call EMS. It resolved after about an hour. Nothing really made it worse or better. She did not take nitroglycerin as she was lying down and did not realize she could take it. This was then followed by a general uneasy sensation in her chest with feeling her heart skipping and racing. For the most part as she was sitting, her heart did not go fast, but when she would rank her heart went very fast. She did not feeling lightheaded or dizziness associated with it but simply did note worsening dyspnea and intermittent chest pain when her rate would go fast.   Her edema is pretty well controlled now that she is back on Zaroxolyn and the higher dose of Lasix.  No syncope/near syncope or TIA/amaurosis fugax. No melena, hematochezia or hematuria. No epistaxis. No claudication.  She notes that some of her GI symptoms improved, but she really didn't notice much improvement AFTER THE CELIAC ARTERY STENTING. She and her husband, [name] of both working hard on trying to adjust her diet and exercise level. She is very proud to see her weight loss.  ROS: A comprehensive was performed. Review of Systems  Constitutional: Positive for malaise/fatigue and weight loss (Intentional).  Respiratory: Negative for cough, shortness of breath and wheezing.   Cardiovascular: Positive for chest pain and palpitations.  Musculoskeletal: Negative.   Neurological: Positive for dizziness (When her  heart was going fast this past weekend). Negative for  weakness.  Endo/Heme/Allergies: Negative for environmental allergies.  Psychiatric/Behavioral: Negative for depression and memory loss. The patient is nervous/anxious. The patient does not have insomnia.   All other systems reviewed and are negative.   I have reviewed and (if needed) personally updated the patient's problem list, medications,  allergies, past medical and surgical history, social and family history.   Past Medical History:  Diagnosis Date  . Arthritis    "middle finger right hand" (01/14/2017)  . Atrial fibrillation ([place])   . Back pain with radiation    Chronic Back Pain - mutliple surgeries (including tumor removal)  . Bilateral edema of lower extremity    Chronic, likely related to venous stasis  . CAD S/P percutaneous coronary angioplasty    a) LHC: 07/30/10. -- 3.0x70mm Integrity BMS to pRCA & 2.5x 66mm Integrity BMS mLAD(@ D2).  b) Class III-IV Angina 03/2011: LHC- ISR in LAD BMS -- prox overlapping Promus DES 2.5x58mm and PTCA of jailed D2 ostium-prox 80%. c) 02/03/12:  LHC- patent stents.  Jailed diagonal. with stable flow; d) Peri-OP NSTEMI 04/2012 - LHC in 12/'13 -   . Celiac artery stenosis ([place])    12/2016 - STENT placement  . Complication of anesthesia    "used to wake up wild" (01/14/2017)  . Congestive heart failure with LV diastolic dysfunction, NYHA class 2 ([place])    06/13/10:  last 2D echo-  EF >55%, Mild TR, Mod Conc LVH - Grade 1 diastolic dysfunction (abnormal relaxation) --> LVEDP on Cath 28 mmHg & mild 2nd Pulm HTN  . Diverticulitis of colon (without mention of hemorrhage)(562.11)   . Dyslipidemia, goal LDL below 70    Intolerant to statins  . GERD (gastroesophageal reflux disease)   . Hepatitis ~ 1957   "yellow jaundice" (01/14/2017)  . Hepatomegaly   . Hiatal hernia   . History of blood transfusion    "when I had a heart attack"  . History of kidney stones   . History of stomach ulcers "years ago"  . Irritable bowel syndrome (IBS)   . Labile essential hypertension    Partially related to RAS  . Mesenteric artery stenosis (HCC)    95% Celiac Artery - ostial, 20-30% SMA.  Bilateral Renal A: L RA stent patent, R RA 20-30% -- Conservative Management  . NSTEMI (non-ST elevated myocardial infarction) ([place]) 04/2012   Unclear the details, apparently this was postoperative from her back surgery  that she was cleared for my last saw her in June.  Reportedly had stents placed  . Pancreas divisum    On pancreatic enzyme  . Renal artery stenosis ([place]) 2011; 12/'13   a) Angiogram 02/03/12:  50-60%L RA stenosis, 40% R R Inferior artery; b) 12/'13: S/P L RA Stent ([facility]) 6.0 mm x 15 mm; c) Renal Duplex 10/2013: <60% L RA, <60 R RA, ~60% SMA & Celiac A.  . Stricture and stenosis of esophagus   . Type II diabetes mellitus ([place])    "borderline; not on RX" (01/14/2017)  . Unspecified disorder of thyroid   . Unspecified gastritis and gastroduodenitis without mention of hemorrhage     Past Surgical History:  Procedure Laterality Date  . APPENDECTOMY    . BACK SURGERY    . BREAST DUCTAL SYSTEM EXCISION     right  . BREAST SURGERY Right   . CARDIAC CATHETERIZATION N/A 03/16/2015   Procedure: Left Heart Cath and Coronary Angiography;  Surgeon: Leonie Man, MD;  Location: [facility];  Service: Cardiovascular; Widely patent m-dLAD stents as wellas pRCA stent.  High LVEDP, small Diag & Om vessels with moderate stenosis   . CARDIOVASCULAR STRESS TEST     11/07/11:  Normal perfusion pattern.  EF 66% No wall motion abnormalities.   Marland Kitchen CATARACT EXTRACTION W/ INTRAOCULAR LENS  IMPLANT, BILATERAL Bilateral   . CHOLECYSTECTOMY OPEN    . COLONOSCOPY  03/01/2009   normal   . CORONARY ANGIOPLASTY WITH STENT PLACEMENT  07/2010, 02/2011   LHC: 07/30/10.  3.0x51mm Integrity BMS to RCA and 2.5x 76mm Integrity BMS LAD.  03/2011: LHC- ISRS in LAD treated with overlapping Promus DES 2.5x48mm and PTCA of Diagonal.  02/03/12:  LHC- patent stents.  Jailed diagonal.  . DOBUTAMINE STRESS ECHO  03/07/2015   [facility] (ordered for pre-op evaluation for EUS/ERCP --> abnormal EKG:  strreesss test shhoowwed 1 mm ST segment depressions downsloping. No wall motion abnormalities at peak exercise or at rest. Diastolic dysfunction was noted but normal systolic function - EF greater than 55%. No bouts of regurgitation or  stenosis. Resting hypertension with exaggerated response  . ESOPHAGOGASTRODUODENOSCOPY  04/08/2012  . FOOT SURGERY Bilateral    "took bone out of 2nd toe on each foot"  . HIP SURGERY     left  . JOINT REPLACEMENT    . KNEE SURGERY Left    "had fluid drained off it a couple times"  . LEFT HEART CATHETERIZATION WITH CORONARY ANGIOGRAM N/A 02/03/2012   Procedure: LEFT HEART CATHETERIZATION WITH CORONARY ANGIOGRAM;  Surgeon: Leonie Man, MD;  Location: [facility] C[facility] LAB;  Service: Cardiovascular;  Laterality: N/A;  . LEFT HEART CATHETERIZATION WITH CORONARY ANGIOGRAM N/A 05/12/2014   Procedure: LEFT HEART CATHETERIZATION WITH CORONARY ANGIOGRAM;  Surgeon: Lorretta Harp, MD;  Location: [facility] CATH LAB;  Service: Cardiovascular: Stable CAD. Patent stents. Patent renal artery stent  . LEFT HEART CATHETERIZATION WITH CORONARY ANGIOGRAM  08/2012   Peri-Op MI @ [facility] -- 40% ostial D1, patent LAD stents, 10% RCA ISR  . LEFT HEART CATHETERIZATION WITH CORONARY ANGIOGRAM   03/2012   ~30% ISR RCA, patent LAD stent & D2; EDP ~28 mmHg  . PANCREAS SURGERY     "stint in my pancreas"; Dr Pershing Proud  . PERCUTANEOUS PINNING PHALANX FRACTURE OF HAND Left ~ 2013  . PERIPHERAL VASCULAR INTERVENTION  01/14/2017   Procedure: Peripheral Vascular Intervention;  Surgeon: Serafina Mitchell, MD;  Location: [facility];  Service: Cardiovascular;;  mesentric  . RENAL ARTERY STENT Left 08/2012   @ [facility] - 6.0 mm x 15 mm  . SPINE SURGERY     tumor removed 07/2010; Redo Surgery 04/2012; Sacroiliac Sgx 08/2013  . TOTAL ABDOMINAL HYSTERECTOMY    . TOTAL KNEE ARTHROPLASTY Left ~ 2012  . VISCERAL ANGIOGRAM N/A 05/12/2014   Procedure: VISCERAL ANGIOGRAM;  Surgeon: Lorretta Harp, MD;  Location: [facility] CATH LAB;  Service: Cardiovascular;  Laterality: N/A;  . VISCERAL ANGIOGRAPHY N/A 01/14/2017   Procedure: Mesenteric  Angiography;  Surgeon: Serafina Mitchell, MD;  Location: [facility];  Service:  Cardiovascular;  Laterality: N/A;    Current Meds  Medication Sig  . acetaminophen (TYLENOL) 325 MG tablet Take 2 tablets (650 mg total) by mouth 3 (three) times daily as needed for mild pain or moderate pain.  Marland Kitchen amitriptyline (ELAVIL) 25 MG tablet Take 1 tablet (25 mg total) by mouth at bedtime. (Patient taking differently: Take 25 mg by mouth at bedtime as needed  for sleep. )  . carvedilol (COREG) 6.25 MG tablet Take 6.25 mg by mouth 2 (two) times daily with a meal.  . clopidogrel (PLAVIX) 75 MG tablet Take 1 tablet (75 mg total) by mouth daily.  . diphenoxylate-atropine (LOMOTIL) 2.5-0.025 MG tablet Take 1-2 four times daily as needed for diarrhea (Patient taking differently: Take 1-2 tablets by mouth daily as needed for diarrhea or loose stools. )  . furosemide (LASIX) 80 MG tablet Take 1 tablet (80 mg total) by mouth 2 (two) times daily.  Marland Kitchen gabapentin (NEURONTIN) 600 MG tablet Take 1,200 mg by mouth 3 (three) times daily.  . isosorbide mononitrate (IMDUR) 60 MG 24 hr tablet Take 60 mg by mouth daily.  Marland Kitchen lidocaine (XYLOCAINE) 5 % ointment Apply 1 application topically at bedtime.   Marland Kitchen losartan (COZAAR) 50 MG tablet Take 1 tablet (50 mg total) by mouth daily.  . metolazone (ZAROXOLYN) 2.5 MG tablet Take 1 to 2 tablets  by mouth 30-60 minutes prior to morning dose of lasix AS NEEDED  . nitroGLYCERIN (NITROSTAT) 0.4 MG SL tablet DISSOLVE 1 TABLET UNDER THE TONGUE EVERY 5 MINUTES AS NEEDED FOR CHEST PAIN  . pantoprazole (PROTONIX) 40 MG tablet Take 1 tablet (40 mg total) by mouth daily.  . [DISCONTINUED] isosorbide mononitrate (IMDUR) 30 MG 24 hr tablet Take 30 mg by mouth daily.  . [DISCONTINUED] metolazone (ZAROXOLYN) 2.5 MG tablet Take 1 tablet by mouth 30-60 minutes prior to morning dose of lasix AS NEEDED   Current Facility-Administered Medications for the 01/21/17 encounter (Office Visit) with Leonie Man, MD  Medication  . fentaNYL (SUBLIMAZE) injection 25 mcg    Allergies    Allergen Reactions  . Codeine Phosphate Anaphylaxis, Shortness Of Breath and Swelling  . Iodine Swelling and Rash    REACTION: unspecified This allergy is to Topical iodine only.   . Prednisone Itching and Rash    Keeps pt up all night   . Statins Other (See Comments)    Intolerant   . Tramadol Other (See Comments)    Kept patient awake    Social History   Social History  . Marital status: Married    Spouse name: N/A  . Number of children: 1  . Years of education: N/A   Occupational History  . gift shop owner    Social History Main Topics  . Smoking status: Never Smoker  . Smokeless tobacco: Never Used  . Alcohol use No  . Drug use: No  . Sexual activity: Not Currently   Other Topics Concern  . None   Social History Narrative   Married mother of one, one grandchild.   Very socially active, enjoys cooking and having get-togethers her house. She had been exercising regularly but her back pain limits her.   Does not smoke, does not drink alcohol.    family history includes Cancer in her father; Colitis in her maternal grandfather; Diabetes in her brother; Heart attack in her brother, brother, brother, and father; Heart disease in her father; Stroke in her brother and mother.  Wt Readings from Last 3 Encounters:  01/21/17 227 lb (103 kg)  01/14/17 222 lb (100.7 kg)  12/23/16 230 lb (104.3 kg)    PHYSICAL EXAM BP (!) 152/64   Pulse 90   Ht 5\' 8"  (1.727 m)   Wt 227 lb (103 kg)   BMI 34.52 kg/m  General appearance: alert, cooperative, appears stated age, no distress. Mild to moderately obese. Well-nourished well-groomed HEENT: [place]/AT, EOMI, MMM,  anicteric sclera Neck: no adenopathy, no carotid bruit and no JVD Lungs: CTAB, nonlabored. Normal percussion. No W/R/R - right sided rib cage tenderness along the "floating" ribs Heart: RRR, normal S1 and S2. Soft S4 gallop. 1/6 SEM at RUSB-->  carotids. Otherwise no rubs.Point tenderness along left sternal  border Abdomen: soft, non-tender; bowel sounds normal; no masses,  no organomegaly; she does have some epigastric tenderness as well as right upper quadrant pain or with palpation of the rib cage Extremities: extremities normal, atraumatic, no cyanosis, and edema - 1+ bilaterally. Support stockings in place.  Pulses: 2+ and symmetric;  Skin: mobility and turgor normal, no evidence of bleeding or bruising, no lesions noted and Mild venous stasis changes with small spider veins/telangiectasias and varicose veins noted bilateral lower extremities.  Neurologic: Mental status: Alert & oriented x 4, thought content appropriate; non-focal exam.  Pleasant mood & affect.    Adult ECG Report  Rate: 90 ;  Rhythm: normal sinus rhythm and Downsloping ST depression with T wave inversions in inferior and anterolateral leads was more slightly more prominent than last EKG. Normal axis, intervals and durations.;   Narrative Interpretation: No significant change   Other studies Reviewed: Additional studies/ records that were reviewed today include:  Recent Labs:  Lipid panel not available from PCP   ASSESSMENT / PLAN: Problem List Items Addressed This Visit    CAD S/P percutaneous coronary angioplasty: pRCA BMS, mLAD BMS overlapped prox with DES for ISR (Chronic)    Last cath was in July 2016 showing patent stents in the LAD and RCA with moderate disease in the diagonal branch and OM branch. As with a prolonged episode of chest pain that she was concerned may been a heart attack. No longer having that symptom, not a saline exacerbated by exertion. She did have a prolonged episode of irregular heart beating after that which makes me concerned for possible arrhythmia. Plan: Lexiscan Myoview. Continue current management with carvedilol - she is actually taking the 6.25 mg twice a day (was unable to tolerate further titration). Also on Imdur 60 mg not 30 mg. On ARB stable dose - Remains on clopidogrel without  aspirin - especially in light of her recent celiac stent      Relevant Medications   metolazone (ZAROXOLYN) 2.5 MG tablet   isosorbide mononitrate (IMDUR) 60 MG 24 hr tablet   carvedilol (COREG) 6.25 MG tablet   Chest pain with moderate risk for cardiac etiology - Primary    Her prolonged episode of chest pain is somewhat concerning for possible cardiac etiology especially since it was then followed by abnormal heartbeats. Plan: Lexiscan Myoview.      Relevant Orders   EKG 12-Lead   Myocardial Perfusion Imaging   Congestive heart failure with LV diastolic dysfunction, NYHA class 2 (HCC) (Chronic)    Overall stable. Blood pressure is not as it is unlikely to be but she does not tolerate higher dose of medicines. I think we can probably come out of from a different angle and use diltiazem for blood pressure control, rate control and antianginal effect.. If that is unaffected would also have room to titrate up losartan.  Continues on standing dose of Lasix 80 twice a day (partly related to venous stasis) with standing dose of Zaroxolyn. We will provide additional Zaroxolyn for her to be on take 5 mg dose was at 2.5 mg if her edema is worse or weight gain is increased at 3 pounds.      Relevant  Medications   metolazone (ZAROXOLYN) 2.5 MG tablet   isosorbide mononitrate (IMDUR) 60 MG 24 hr tablet   carvedilol (COREG) 6.25 MG tablet   Other Relevant Orders   Myocardial Perfusion Imaging   Costochondritis, acute    I think a lot of the symptoms she has along her right costal margin could be costochondritis. She also has some point tenderness along the left sternal border as well. This would be consistent with cost and redness. I think we can do a short course taper of NSAIDs: 800 mg 3 times a day for 2 days, 6 mm 3 times a day for 2 days, 400 mg 3 times a day 2 days, 200 mg 3 times a day 2 days. Taken with food and adequate hydration.      Relevant Orders   EKG 12-Lead   Dyslipidemia,  goal LDL below 70 - WITH STATIN INTOLERANCE (Chronic)    Taking high-dose fish oil. She is due to follow-up at [facility] to determine candidacy for PCSK9 inhibitor  - refuses to take statin.      Relevant Medications   metolazone (ZAROXOLYN) 2.5 MG tablet   isosorbide mononitrate (IMDUR) 60 MG 24 hr tablet   carvedilol (COREG) 6.25 MG tablet   Essential hypertension (Chronic)   Relevant Medications   metolazone (ZAROXOLYN) 2.5 MG tablet   isosorbide mononitrate (IMDUR) 60 MG 24 hr tablet   carvedilol (COREG) 6.25 MG tablet   Exertional dyspnea (Chronic)    Chronic get stable. We are evaluating with a Myoview      Relevant Orders   EKG 12-Lead   Cardiac event monitor   Myocardial Perfusion Imaging   Obesity (BMI 30.0-34.9) (Chronic)    She still has ups and downs of her weight but has overall lost some weight. She and her husband are making conscious efforts of adjusting diet and trying to pick up exercise.      Palpitation (Chronic)    Separately episode of irregular heartbeat that would get faster with exertion. This is somewhat concerning for atrial fibrillation. - Especially considering that she had discomfort when her heart rate when fast.  Plan: 30 monitor to reassess. Regardless of results, the next step would be to start diltiazem for combination rate control/and hypertension control as well as antianginal control.      Relevant Orders   EKG 12-Lead   Cardiac event monitor   Myocardial Perfusion Imaging     Prolonged time spent with the patient. At least 4 45 minutes was spent. > 50% of the time was spent in direct patient consultation   Current medicines are reviewed at length with the patient today. (+/- concerns) - was unable to tolerate up-titrtation of Carvedilol in the past -- fatigue. The following changes have been made: continue to Metolazone & Lasix - will provide additional 2.5 mg tablets to allow for 5 mg dosing +Lasix if edema worsens.  Patient  Instructions  SCHEDULE AT [place] [address] Your physician has recommended that you wear an event monitor 30 DAY . Event monitors are medical devices that record the heart's electrical activity. Doctors most often [place] these monitors to diagnose arrhythmias. Arrhythmias are problems with the speed or rhythm of the heartbeat. The monitor is a small, portable device. You can wear one while you do your normal daily activities. This is usually used to diagnose what is causing palpitations/syncope (passing out).    SCHEDULE AT [place] Your physician has requested that you  have a lexiscan myoview. For further information please visit [email or web address]. Please follow instruction sheet, as given.    MEDICATION ADDITION FOR COSTOCHONDRITIS  Take Ibuprofen  ( 200 MG ) taper dose 800 mg three times a day for 2days 600 mg three times a day for 2 days 400 mg three times a day for 2 days 200 mg three times a day for 2 days THEN STOP  Medication changes METOLAZONE 2.5 MG----Take 1 to 2 tablets  by mouth 30-60 minutes prior to morning dose of lasix AS NEEDED     Your physician recommends that you schedule a follow-up appointment in @ 2 months with Dr Ellyn Hack f/u test results    Costochondritis Costochondritis is swelling and irritation (inflammation) of the tissue (cartilage) that connects your ribs to your breastbone (sternum). This causes pain in the front of your chest. Usually, the pain:  Starts gradually.  Is in more than one rib. This condition usually goes away on its own over time. Follow these instructions at home:  Do not do anything that makes your pain worse.  If directed, put ice on the painful area:  Put ice in a plastic bag.  Place a towel between your skin and the bag.  Leave the ice on for 20 minutes, 2-3 times a day.  If directed, put heat on the affected area as often as told by your doctor. Use the heat source that your doctor  tells you to use, such as a moist heat pack or a heating pad.  Place a towel between your skin and the heat source.  Leave the heat on for 20-30 minutes.  Take off the heat if your skin turns bright red. This is very important if you cannot feel pain, heat, or cold. You may have a greater risk of getting burned.  Take over-the-counter and prescription medicines only as told by your doctor.  Return to your normal activities as told by your doctor. Ask your doctor what activities are safe for you.  Keep all follow-up visits as told by your doctor. This is important. Contact a doctor if:  You have chills or a fever.  Your pain does not go away or it gets worse.  You have a cough that does not go away. Get help right away if:  You are short of breath. This information is not intended to replace advice given to you by your health care provider. Make sure you discuss any questions you have with your health care provider. Document Released: 02/05/2008 Document Revised: 03/08/2016 Document Reviewed: 12/13/2015 Elsevier Interactive Patient Education  2017 [employer].      Studies Ordered:   Orders Placed This Encounter  Procedures  . Cardiac event monitor  . Myocardial Perfusion Imaging  . EKG 12-Lead      Glenetta Hew, M.D., M.S. Interventional Cardiologist   Pager # [phone] Phone # [phone] [address] [place], [place] [address]

## 2017-01-21 NOTE — Assessment & Plan Note (Signed)
Taking high-dose fish oil. She is due to follow-up at [facility] to determine candidacy for PCSK9 inhibitor  - refuses to take statin.

## 2017-01-21 NOTE — Patient Instructions (Addendum)
SCHEDULE AT [place] has recommended that you wear an event monitor 30 DAY . Event monitors are medical devices that record the heart's electrical activity. Doctors most often [place] these monitors to diagnose arrhythmias. Arrhythmias are problems with the speed or rhythm of the heartbeat. The monitor is a small, portable device. You can wear one while you do your normal daily activities. This is usually used to diagnose what is causing palpitations/syncope (passing out).    SCHEDULE AT [place] Your physician has requested that you have a lexiscan myoview. For further information please visit [email or web address]. Please follow instruction sheet, as given.    MEDICATION ADDITION FOR COSTOCHONDRITIS  Take Ibuprofen  ( 200 MG ) taper dose 800 mg three times a day for 2days 600 mg three times a day for 2 days 400 mg three times a day for 2 days 200 mg three times a day for 2 days THEN STOP  Medication changes METOLAZONE 2.5 MG----Take 1 to 2 tablets  by mouth 30-60 minutes prior to morning dose of lasix AS NEEDED     Your physician recommends that you schedule a follow-up appointment in @ 2 months with Dr Ellyn Hack f/u test results    Costochondritis Costochondritis is swelling and irritation (inflammation) of the tissue (cartilage) that connects your ribs to your breastbone (sternum). This causes pain in the front of your chest. Usually, the pain:  Starts gradually.  Is in more than one rib. This condition usually goes away on its own over time. Follow these instructions at home:  Do not do anything that makes your pain worse.  If directed, put ice on the painful area:  Put ice in a plastic bag.  Place a towel between your skin and the bag.  Leave the ice on for 20 minutes, 2-3 times a day.  If directed, put heat on the affected area as often as told by your doctor. Use the heat source that your doctor tells you to  use, such as a moist heat pack or a heating pad.  Place a towel between your skin and the heat source.  Leave the heat on for 20-30 minutes.  Take off the heat if your skin turns bright red. This is very important if you cannot feel pain, heat, or cold. You may have a greater risk of getting burned.  Take over-the-counter and prescription medicines only as told by your doctor.  Return to your normal activities as told by your doctor. Ask your doctor what activities are safe for you.  Keep all follow-up visits as told by your doctor. This is important. Contact a doctor if:  You have chills or a fever.  Your pain does not go away or it gets worse.  You have a cough that does not go away. Get help right away if:  You are short of breath. This information is not intended to replace advice given to you by your health care provider. Make sure you discuss any questions you have with your health care provider. Document Released: 02/05/2008 Document Revised: 03/08/2016 Document Reviewed: 12/13/2015 Elsevier Interactive Patient Education  2017 [employer].

## 2017-01-21 NOTE — Assessment & Plan Note (Signed)
She still has ups and downs of her weight but has overall lost some weight. She and her husband are making conscious efforts of adjusting diet and trying to pick up exercise.

## 2017-01-22 ENCOUNTER — Telehealth (HOSPITAL_COMMUNITY): Payer: Self-pay

## 2017-01-22 NOTE — Telephone Encounter (Signed)
Encounter complete. 

## 2017-01-23 ENCOUNTER — Ambulatory Visit (HOSPITAL_COMMUNITY)
Admission: RE | Admit: 2017-01-23 | Discharge: 2017-01-23 | Disposition: A | Discharge status: Home / Self Care | Payer: Medicare Other | Source: Ambulatory Visit | Attending: Cardiology | Admitting: Cardiology

## 2017-01-23 DIAGNOSIS — I110 Hypertensive heart disease with heart failure: Secondary | ICD-10-CM | POA: Insufficient documentation

## 2017-01-23 DIAGNOSIS — I5030 Unspecified diastolic (congestive) heart failure: Secondary | ICD-10-CM | POA: Insufficient documentation

## 2017-01-23 DIAGNOSIS — E1151 Type 2 diabetes mellitus with diabetic peripheral angiopathy without gangrene: Secondary | ICD-10-CM | POA: Diagnosis not present

## 2017-01-23 DIAGNOSIS — R079 Chest pain, unspecified: Secondary | ICD-10-CM | POA: Diagnosis not present

## 2017-01-23 DIAGNOSIS — Z6834 Body mass index (BMI) 34.0-34.9, adult: Secondary | ICD-10-CM | POA: Insufficient documentation

## 2017-01-23 DIAGNOSIS — E669 Obesity, unspecified: Secondary | ICD-10-CM | POA: Insufficient documentation

## 2017-01-23 DIAGNOSIS — I2510 Atherosclerotic heart disease of native coronary artery without angina pectoris: Secondary | ICD-10-CM | POA: Insufficient documentation

## 2017-01-23 DIAGNOSIS — Z8249 Family history of ischemic heart disease and other diseases of the circulatory system: Secondary | ICD-10-CM | POA: Diagnosis not present

## 2017-01-23 DIAGNOSIS — R002 Palpitations: Secondary | ICD-10-CM | POA: Diagnosis not present

## 2017-01-23 DIAGNOSIS — R0609 Other forms of dyspnea: Secondary | ICD-10-CM | POA: Insufficient documentation

## 2017-01-23 MED ORDER — REGADENOSON 0.4 MG/5ML IV SOLN
0.4000 mg | Freq: Once | INTRAVENOUS | Status: AC
Start: 2017-01-23 — End: 2017-01-23
  Administered 2017-01-23: 0.4 mg via INTRAVENOUS

## 2017-01-23 MED ORDER — TECHNETIUM TC 99M TETROFOSMIN IV KIT
31.5000 | PACK | Freq: Once | INTRAVENOUS | Status: AC | PRN
  Administered 2017-01-23: 31.5 via INTRAVENOUS
  Filled 2017-01-23: qty 32

## 2017-01-24 ENCOUNTER — Ambulatory Visit (HOSPITAL_COMMUNITY)
Admission: RE | Admit: 2017-01-24 | Discharge: 2017-01-24 | Disposition: A | Discharge status: Home / Self Care | Payer: Medicare Other | Source: Ambulatory Visit | Attending: Cardiovascular Disease | Admitting: Cardiovascular Disease

## 2017-01-24 LAB — MYOCARDIAL PERFUSION IMAGING
CHL CUP NUCLEAR SRS: 1
CHL CUP NUCLEAR SSS: 4
CHL CUP RESTING HR STRESS: 79 {beats}/min
LV dias vol: 93 mL (ref 46–106)
LVSYSVOL: 25 mL
NUC STRESS TID: 1.02
Peak HR: 93 {beats}/min
SDS: 3

## 2017-01-24 MED ORDER — TECHNETIUM TC 99M TETROFOSMIN IV KIT
28.0000 | PACK | Freq: Once | INTRAVENOUS | Status: AC | PRN
  Administered 2017-01-24: 28 via INTRAVENOUS

## 2017-01-29 DIAGNOSIS — M542 Cervicalgia: Secondary | ICD-10-CM | POA: Diagnosis not present

## 2017-01-29 DIAGNOSIS — M47812 Spondylosis without myelopathy or radiculopathy, cervical region: Secondary | ICD-10-CM | POA: Diagnosis not present

## 2017-01-30 DIAGNOSIS — M542 Cervicalgia: Secondary | ICD-10-CM | POA: Diagnosis not present

## 2017-01-30 DIAGNOSIS — M47812 Spondylosis without myelopathy or radiculopathy, cervical region: Secondary | ICD-10-CM | POA: Diagnosis not present

## 2017-01-30 DIAGNOSIS — M791 Myalgia: Secondary | ICD-10-CM | POA: Diagnosis not present

## 2017-01-31 HISTORY — PX: OTHER SURGICAL HISTORY: SHX169

## 2017-02-05 ENCOUNTER — Ambulatory Visit (INDEPENDENT_AMBULATORY_CARE_PROVIDER_SITE_OTHER): Payer: Medicare Other

## 2017-02-05 DIAGNOSIS — R002 Palpitations: Secondary | ICD-10-CM | POA: Diagnosis not present

## 2017-02-05 DIAGNOSIS — R0609 Other forms of dyspnea: Secondary | ICD-10-CM

## 2017-02-13 ENCOUNTER — Other Ambulatory Visit: Payer: Self-pay | Admitting: Internal Medicine

## 2017-02-20 ENCOUNTER — Encounter: Payer: Self-pay | Admitting: Surgery

## 2017-02-20 NOTE — Addendum Note (Signed)
Addended by: Lianne Cure A on: 02/20/2017 03:53 PM   Modules accepted: Orders

## 2017-02-21 ENCOUNTER — Ambulatory Visit: Payer: Medicare Other

## 2017-02-24 ENCOUNTER — Ambulatory Visit (INDEPENDENT_AMBULATORY_CARE_PROVIDER_SITE_OTHER): Payer: Medicare Other | Admitting: Internal Medicine

## 2017-02-24 ENCOUNTER — Encounter: Payer: Self-pay | Admitting: Internal Medicine

## 2017-02-24 VITALS — BP 124/48 | HR 100 | Ht 65.0 in | Wt 228.0 lb

## 2017-02-24 DIAGNOSIS — R195 Other fecal abnormalities: Secondary | ICD-10-CM

## 2017-02-24 DIAGNOSIS — K580 Irritable bowel syndrome with diarrhea: Secondary | ICD-10-CM

## 2017-02-24 DIAGNOSIS — I701 Atherosclerosis of renal artery: Secondary | ICD-10-CM | POA: Diagnosis not present

## 2017-02-24 DIAGNOSIS — R1013 Epigastric pain: Secondary | ICD-10-CM

## 2017-02-24 DIAGNOSIS — R140 Abdominal distension (gaseous): Secondary | ICD-10-CM

## 2017-02-24 DIAGNOSIS — G8929 Other chronic pain: Secondary | ICD-10-CM

## 2017-02-24 MED ORDER — RIFAXIMIN 550 MG PO TABS: 550.0000 mg | ORAL_TABLET | Freq: Three times a day (TID) | ORAL | 0 refills | Status: AC

## 2017-02-24 NOTE — Patient Instructions (Signed)
We have sent your demographic information and a prescription for Xifaxan to [facility]. This pharmacy is able to get medication approved through insurance and get you the lowest copay possible. If you have not heard from them within 1 week, please call our office at [phone] to let us know.  If you are age 75 or older, your body mass index should be between 23-30. Your Body mass index is 37.94 kg/m. If this is out of the aforementioned range listed, please consider follow up with your Primary Care Provider.  If you are age 39 or younger, your body mass index should be between 19-25. Your Body mass index is 37.94 kg/m. If this is out of the aformentioned range listed, please consider follow up with your Primary Care Provider.    Please call our office in 2 weeks and give [place] an update on your symptoms.

## 2017-02-24 NOTE — Progress Notes (Signed)
Subjective:    Patient ID: [name], female    DOB: 07-19-1941, 76 y.o.   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 She was last seen on 11/18/2016. She is here alone today.  Dr. Trula Slade performed mesenteric angiography in May 2018 and celiac artery stent placement. The stent placement completely resolve the celiac stenosis. The SMA was noted to be approximately 30% stenosed. She recovered from this procedure but reports it has not helped with her epigastric abdominal pain. She is still having episodes of epigastric and right upper quadrant pain radiating across the upper abdomen. This is associated with nausea but less frequently vomiting. She is still reporting days of loose watery stools as many as 3-6 times per day. Stools are nonbloody and non-melenic. Imodium was too effective and Lomotil made her symptoms worse. She's tried Zenpep previously prescribed by Dr. Sharlett Iles and most recently by Dr. Harl Bowie but this did not help or improve the loose stools. She also had fecal elastase which was normal.  She reports that several doctors question costochondritis but she has been intolerant of prednisone. She took 4 days of ibuprofen therapy recommended by Dr. Ellyn Hack but this didn't seem to help her pain.  MRI/MRCP performed this year showed stable biliary ductal dilatation postcholecystectomy but no CBD stones. Pancreas divisum without abnormality in the pancreas parenchyma. Liver enzymes and lipase have been normal.  Colonoscopy with history of diverticulosis in 2010. Biopsies negative for microscopic colitis.  EGD 2015 with Dr. Carlean Purl with mild gastritis but no H.  pylori.  Review of Systems As per history of present illness, otherwise negative  Current Medications, Allergies, Past Medical History, Past Surgical History, Family History and Social History were reviewed in [employer] record.     Objective:   Physical Exam BP (!) 124/48 (Cuff Size: Large)   Pulse 100   Ht 5\' 5"  (1.651 m) Comment: w/o shoes  Wt 228 lb (103.4 kg)   BMI 37.94 kg/m  Constitutional: Well-developed and well-nourished. No distress. HEENT: Normocephalic and atraumatic. Conjunctivae are normal.  No scleral icterus. Neck: Neck supple. Trachea midline. Cardiovascular: Normal rate, regular rhythm and intact distal pulses. No M/R/G Pulmonary/chest: Effort normal and breath sounds normal. No wheezing, rales or rhonchi. Abdominal: Soft, nontender, nondistended. Bowel sounds active throughout. There are no masses palpable. No hepatosplenomegaly. Extremities: no clubbing, cyanosis, 1+ Le edema Neurological: Alert and oriented to person place and time. Skin: Skin is warm and dry. Psychiatric: Normal mood and affect. Behavior is normal.  CBC    Component Value Date/Time   WBC 9.6 10/10/2016 1215   RBC 4.61 10/10/2016 1215   HGB 11.9 (L) 01/14/2017 0644   HCT 35.0 (L) 01/14/2017 0644   PLT 242.0 10/10/2016 1215   MCV 86.5 10/10/2016 1215   MCH 28.6 03/16/2015 0609   MCHC 33.6 10/10/2016 1215   RDW 13.5 10/10/2016 1215   LYMPHSABS 2.1 10/10/2016 1215   MONOABS 0.9 10/10/2016 1215   EOSABS 0.1 10/10/2016 1215   BASOSABS 0.1 10/10/2016 1215   CMP     Component Value Date/Time   NA 136 01/14/2017 0644   K 2.8 (L) 01/14/2017 0644   CL 92 (L) 01/14/2017 3716  CO2 31 10/24/2016 1127   GLUCOSE 169 (H) 01/14/2017 0644   BUN 23 (H) 01/14/2017 0644   CREATININE 0.70 01/14/2017 0644   CREATININE 0.90 10/24/2016 1127   CALCIUM 9.7 10/24/2016 1127   PROT 7.7 10/10/2016 1215   ALBUMIN 4.2 10/10/2016 1215   AST 13 10/10/2016 1215   ALT 13 10/10/2016  1215   ALKPHOS 84 10/10/2016 1215   BILITOT 0.6 10/10/2016 1215   GFRNONAA >60 03/16/2015 0609   GFRAA >60 03/16/2015 0609       Assessment & Plan:  76 year old female with a past medical history of chronic epigastric abdominal pain, pancreas divisum with history of ERCP with biliary sphincterotomy, CAD and peripheral vascular disease, celiac artery stenosis now status post stent placement in May 2018, chronic loose stools with abdominal bloating who is here for follow-up.  1. Chronic epigastric and right upper quadrant abdominal pain with intermittent loose stools and abdominal bloating -- she has been evaluated by biliary specialist at [facility], Dr. Harl Bowie, who did not feel pain was secondary to bile duct stricture or obstruction. She now has had her celiac artery opened by stent placement without improvement in her upper abdominal pain. This is certainly a chronic pain and after thorough and complete evaluation this is most likely going to continue to be a chronic pain with a component of irritable bowel. She also has diarrhea which has been evaluated with negative stool studies, normal fecal elastase arguing against pancreatic insufficiency, and negative biopsies for microscopic colitis. She has not been treated with rifaximin. --My recommendation is to try rifaximin 550 mg 3 times a day 14 days.  2. Celiac artery stenosis -- she has follow-up with Dr. Trula Slade tomorrow for what is likely Doppler study.  25 minutes spent with the patient today. Greater than 50% was spent in counseling and coordination of care with the patient

## 2017-02-25 ENCOUNTER — Ambulatory Visit (HOSPITAL_COMMUNITY)
Admission: RE | Admit: 2017-02-25 | Discharge: 2017-02-25 | Disposition: A | Discharge status: Home / Self Care | Payer: Medicare Other | Source: Ambulatory Visit | Attending: Surgery | Admitting: Surgery

## 2017-02-25 ENCOUNTER — Telehealth: Payer: Self-pay | Admitting: Internal Medicine

## 2017-02-25 DIAGNOSIS — K551 Chronic vascular disorders of intestine: Secondary | ICD-10-CM | POA: Insufficient documentation

## 2017-02-25 DIAGNOSIS — Z9862 Peripheral vascular angioplasty status: Secondary | ICD-10-CM

## 2017-02-25 DIAGNOSIS — R1013 Epigastric pain: Secondary | ICD-10-CM | POA: Insufficient documentation

## 2017-02-25 NOTE — Telephone Encounter (Signed)
Office notes faxed 

## 2017-02-26 ENCOUNTER — Ambulatory Visit (HOSPITAL_COMMUNITY)
Admission: RE | Admit: 2017-02-26 | Discharge: 2017-02-26 | Disposition: A | Discharge status: Home / Self Care | Payer: Medicare Other | Source: Ambulatory Visit | Attending: Vascular Surgery | Admitting: Vascular Surgery

## 2017-02-26 DIAGNOSIS — I87309 Chronic venous hypertension (idiopathic) without complications of unspecified lower extremity: Secondary | ICD-10-CM | POA: Insufficient documentation

## 2017-02-27 DIAGNOSIS — M791 Myalgia: Secondary | ICD-10-CM | POA: Diagnosis not present

## 2017-02-27 DIAGNOSIS — I10 Essential (primary) hypertension: Secondary | ICD-10-CM | POA: Diagnosis not present

## 2017-02-27 DIAGNOSIS — M47812 Spondylosis without myelopathy or radiculopathy, cervical region: Secondary | ICD-10-CM | POA: Diagnosis not present

## 2017-02-27 DIAGNOSIS — M47816 Spondylosis without myelopathy or radiculopathy, lumbar region: Secondary | ICD-10-CM | POA: Diagnosis not present

## 2017-02-27 NOTE — Telephone Encounter (Signed)
Pharmacy states medication was approved and they are going to reach out to the patient. The pharmacy does say that the patient has a high deductible copay tho, so no further assistance with payment could be received.

## 2017-03-03 ENCOUNTER — Encounter: Payer: Self-pay | Admitting: Surgery

## 2017-03-03 ENCOUNTER — Ambulatory Visit (INDEPENDENT_AMBULATORY_CARE_PROVIDER_SITE_OTHER): Payer: Medicare Other | Admitting: Surgery

## 2017-03-03 VITALS — BP 164/80 | HR 87 | Temp 97.7°F | Resp 16 | Ht 65.0 in | Wt 226.0 lb

## 2017-03-03 DIAGNOSIS — I701 Atherosclerosis of renal artery: Secondary | ICD-10-CM | POA: Diagnosis not present

## 2017-03-03 DIAGNOSIS — I774 Celiac artery compression syndrome: Secondary | ICD-10-CM

## 2017-03-03 DIAGNOSIS — I771 Stricture of artery: Secondary | ICD-10-CM

## 2017-03-03 NOTE — Progress Notes (Signed)
[facility] [place]  Patient name: TANISH SINKLER MRN: [id number] DOB: 03-28-1941 Sex: female   REASON FOR VISIT:    Post angio  HISOTRY OF PRESENT ILLNESS:    [name] is a 76 y.o. female returns today for follow-up.  She underwent celiac artery stenting on 01/14/2017.  I placed a 7 x 19 Omnilink stent for a 90% stenosis.  Unfortunately, the patient has not had any change in her symptoms since the stent placement.  The patient has had chronic abdominal pain.  She has exhausted all medical options to relieve her pain.  The only thing remaining was her celiac stenosis.She has a very, locating GI history and a long-standing history of abdominal pain.  She underwent cholecystectomy at age 2 she is been diagnosed with pancreatic divisum and has had stents placed 20 years ago at [facility].  She continues to have abdominal pain.  She has undergone multiple trials which have been unsuccessful with resolving her pain  She is also complaining of leg swelling.  PAST MEDICAL HISTORY:   Past Medical History:  Diagnosis Date  . Arthritis    "middle finger right hand" (01/14/2017)  . Atrial fibrillation ([place])   . Back pain with radiation    Chronic Back Pain - mutliple surgeries (including tumor removal)  . Bilateral edema of lower extremity    Chronic, likely related to venous stasis  . CAD S/P percutaneous coronary angioplasty    a) LHC: 07/30/10. -- 3.0x46mm Integrity BMS to pRCA & 2.5x 52mm Integrity BMS mLAD(@ D2).  b) Class III-IV Angina 03/2011: LHC- ISR in LAD BMS -- prox overlapping Promus DES 2.5x24mm and PTCA of jailed D2 ostium-prox 80%. c) 02/03/12:  LHC- patent stents.  Jailed diagonal. with stable flow; d) Peri-OP NSTEMI 04/2012 - LHC in 12/'13 -   . Celiac artery stenosis ([place])    12/2016 - STENT placement  . Complication of anesthesia    "used to wake up wild" (01/14/2017)  . Congestive heart failure with LV diastolic  dysfunction, NYHA class 2 ([place])    06/13/10:  last 2D echo-  EF >55%, Mild TR, Mod Conc LVH - Grade 1 diastolic dysfunction (abnormal relaxation) --> LVEDP on Cath 28 mmHg & mild 2nd Pulm HTN  . Diverticulitis of colon (without mention of hemorrhage)(562.11)   . Dyslipidemia, goal LDL below 70    Intolerant to statins  . GERD (gastroesophageal reflux disease)   . Hepatitis ~ 1957   "yellow jaundice" (01/14/2017)  . Hepatomegaly   . Hiatal hernia   . History of blood transfusion    "when I had a heart attack"  . History of kidney stones   . History of stomach ulcers "years ago"  . Irritable bowel syndrome (IBS)   . Labile essential hypertension    Partially related to RAS  . Mesenteric artery stenosis ([facility])    95% Celiac Artery - ostial, 20-30% SMA.  Bilateral Renal A: L RA stent patent, R RA 20-30% -- Conservative Management  . NSTEMI (non-ST elevated myocardial infarction) ([place]) 04/2012   Unclear the details, apparently this was postoperative from her back surgery that she was cleared for my last saw her in June.  Reportedly had stents placed  . Pancreas divisum    On pancreatic enzyme  . Renal artery stenosis ([place]) 2011; 12/'13   a) Angiogram 02/03/12:  50-60%L RA stenosis, 40% R R Inferior artery; b) 12/'13: S/P L RA Stent ([place]) 6.0 mm x  15 mm; c) Renal Duplex 10/2013: <60% L RA, <60 R RA, ~60% SMA & Celiac A.  . Stricture and stenosis of esophagus   . Type II diabetes mellitus ([place])    "borderline; not on RX" (01/14/2017)  . Unspecified disorder of thyroid   . Unspecified gastritis and gastroduodenitis without mention of hemorrhage      FAMILY HISTORY:   Family History  Problem Relation Age of Onset  . Cancer Father        mets  . Heart attack Father   . Heart disease Father   . Stroke Mother   . Heart attack Brother   . Heart attack Brother   . Colitis Maternal Grandfather   . Diabetes Brother   . Stroke Brother   . Heart attack Brother   . Colon cancer Neg Hx    . Stomach cancer Neg Hx     SOCIAL HISTORY:   Social History  Substance Use Topics  . Smoking status: Never Smoker  . Smokeless tobacco: Never Used  . Alcohol use No     ALLERGIES:   Allergies  Allergen Reactions  . Codeine Phosphate Anaphylaxis, Shortness Of Breath and Swelling  . Iodine Swelling and Rash    REACTION: unspecified This allergy is to Topical iodine only.   . Prednisone Itching and Rash    Keeps pt up all night   . Statins Other (See Comments)    Intolerant   . Tramadol Other (See Comments)    Kept patient awake     CURRENT MEDICATIONS:   Current Outpatient Prescriptions  Medication Sig Dispense Refill  . acetaminophen (TYLENOL) 325 MG tablet Take 2 tablets (650 mg total) by mouth 3 (three) times daily as needed for mild pain or moderate pain.    Marland Kitchen amitriptyline (ELAVIL) 25 MG tablet TAKE 1 TABLET(25 MG) BY MOUTH AT BEDTIME 30 tablet 0  . carvedilol (COREG) 6.25 MG tablet Take 6.25 mg by mouth 2 (two) times daily with a meal.    . clopidogrel (PLAVIX) 75 MG tablet Take 1 tablet (75 mg total) by mouth daily. 30 tablet 7  . diphenoxylate-atropine (LOMOTIL) 2.5-0.025 MG tablet Take 1-2 four times daily as needed for diarrhea (Patient taking differently: Take 1-2 tablets by mouth daily as needed for diarrhea or loose stools. ) 240 tablet 3  . furosemide (LASIX) 80 MG tablet Take 1 tablet (80 mg total) by mouth 2 (two) times daily. 180 tablet 3  . gabapentin (NEURONTIN) 600 MG tablet Take 1,200 mg by mouth 3 (three) times daily.    . isosorbide mononitrate (IMDUR) 60 MG 24 hr tablet Take 60 mg by mouth daily.    Marland Kitchen lidocaine (XYLOCAINE) 5 % ointment Apply 1 application topically at bedtime.     Marland Kitchen losartan (COZAAR) 50 MG tablet Take 1 tablet (50 mg total) by mouth daily. 90 tablet 3  . metolazone (ZAROXOLYN) 2.5 MG tablet Take 1 to 2 tablets  by mouth 30-60 minutes prior to morning dose of lasix AS NEEDED 60 tablet 6  . nitroGLYCERIN (NITROSTAT) 0.4 MG SL  tablet DISSOLVE 1 TABLET UNDER THE TONGUE EVERY 5 MINUTES AS NEEDED FOR CHEST PAIN 25 tablet 4  . pantoprazole (PROTONIX) 40 MG tablet TAKE 1 TABLET(40 MG) BY MOUTH DAILY 90 tablet 0  . rifaximin (XIFAXAN) 550 MG TABS tablet Take 1 tablet (550 mg total) by mouth 3 (three) times daily. 42 tablet 0   Current Facility-Administered Medications  Medication Dose Route Frequency Provider Last Rate Last  Dose  . fentaNYL (SUBLIMAZE) injection 25 mcg  25 mcg Intravenous Q1H PRN Serafina Mitchell, MD        REVIEW OF SYSTEMS:   [X]  denotes positive finding, [ ]  denotes negative finding Cardiac  Comments:  Chest pain or chest pressure:    Shortness of breath upon exertion:    Short of breath when lying flat:    Irregular heart rhythm:        Vascular    Pain in calf, thigh, or hip brought on by ambulation:    Pain in feet at night that wakes you up from your sleep:     Blood clot in your veins:    Leg swelling:         Pulmonary    Oxygen at home:    Productive cough:     Wheezing:         Neurologic    Sudden weakness in arms or legs:     Sudden numbness in arms or legs:     Sudden onset of difficulty speaking or slurred speech:    Temporary loss of vision in one eye:     Problems with dizziness:         Gastrointestinal    Blood in stool:     Vomited blood:         Genitourinary    Burning when urinating:     Blood in urine:        Psychiatric    Major depression:         Hematologic    Bleeding problems:    Problems with blood clotting too easily:        Skin    Rashes or ulcers:        Constitutional    Fever or chills:      PHYSICAL EXAM:   Vitals:   03/03/17 0915 03/03/17 0921  BP: (!) 164/74 (!) 164/80  Pulse: 87   Resp: 16   Temp: 97.7 F (36.5 C)   TempSrc: Oral   SpO2: 90%   Weight: 226 lb (102.5 kg)   Height: 5\' 5"  (1.651 m)     GENERAL: The patient is a well-nourished female, in no acute distress. The vital signs are documented above. CARDIAC:  There is a regular rate and rhythm.  VASCULAR: Bilateral lower extremity edema PULMONARY: Non-labored respirations MUSCULOSKELETAL: There are no major deformities or cyanosis. NEUROLOGIC: No focal weakness or paresthesias are detected. SKIN: There are no ulcers or rashes noted. PSYCHIATRIC: The patient has a normal affect.  STUDIES:   Mesenteric ultrasound: Unable to visualize celiac artery  Venous reflux examination: Reflux was noted in the right common femoral and saphenofemoral junction.  There was no evidence of reflux in the left venous system  MEDICAL ISSUES:   Celiac stenting: Unfortunately, the patient did not have any relief of her abdominal pain after stenting.  She will now be on protocol for follow-up.  Leg swelling: Venous reflux examination was essentially normal.  I have counseled the patient to wear compression stockings and return in 6 months for possible evaluation of a Normatec device    Annamarie Major, MD [facility] [phone] Pager [phone]

## 2017-03-07 ENCOUNTER — Telehealth: Payer: Self-pay | Admitting: Internal Medicine

## 2017-03-07 NOTE — Telephone Encounter (Signed)
Patient cannot afford co-pay for Xifaxan. Dr. Hilarie Fredrickson, I have enough samples to give patient 11 days worth. Do you want me to give samples?

## 2017-03-07 NOTE — Telephone Encounter (Signed)
Left a message for patient to return my call.  Informed [facility] we are giving patient samples.

## 2017-03-07 NOTE — Telephone Encounter (Signed)
Yes, let's try rifaximin 550 mg TID x 10 days Thank you

## 2017-03-10 NOTE — Telephone Encounter (Signed)
Informed patient that we have samples of Xifaxan that we can leave at our front desk if she can pick them up this week. Patient states she can come by this week to pick them up. Informed patient to take them three times a day x 10 days. Patient verbalized understanding.

## 2017-03-17 NOTE — Addendum Note (Signed)
Addended by: Lianne Cure A on: 03/17/2017 03:41 PM   Modules accepted: Orders

## 2017-03-19 ENCOUNTER — Other Ambulatory Visit: Payer: Self-pay | Admitting: Internal Medicine

## 2017-03-20 ENCOUNTER — Encounter: Payer: Self-pay | Admitting: Cardiology

## 2017-03-20 ENCOUNTER — Ambulatory Visit (INDEPENDENT_AMBULATORY_CARE_PROVIDER_SITE_OTHER): Payer: Medicare Other | Admitting: Cardiology

## 2017-03-20 VITALS — BP 149/78 | HR 88 | Ht 67.5 in | Wt 215.6 lb

## 2017-03-20 DIAGNOSIS — I2510 Atherosclerotic heart disease of native coronary artery without angina pectoris: Secondary | ICD-10-CM

## 2017-03-20 DIAGNOSIS — R079 Chest pain, unspecified: Secondary | ICD-10-CM

## 2017-03-20 DIAGNOSIS — I5030 Unspecified diastolic (congestive) heart failure: Secondary | ICD-10-CM | POA: Diagnosis not present

## 2017-03-20 DIAGNOSIS — E785 Hyperlipidemia, unspecified: Secondary | ICD-10-CM | POA: Diagnosis not present

## 2017-03-20 DIAGNOSIS — R0609 Other forms of dyspnea: Secondary | ICD-10-CM

## 2017-03-20 DIAGNOSIS — Z9861 Coronary angioplasty status: Secondary | ICD-10-CM

## 2017-03-20 DIAGNOSIS — I10 Essential (primary) hypertension: Secondary | ICD-10-CM

## 2017-03-20 DIAGNOSIS — E669 Obesity, unspecified: Secondary | ICD-10-CM

## 2017-03-20 DIAGNOSIS — I87302 Chronic venous hypertension (idiopathic) without complications of left lower extremity: Secondary | ICD-10-CM | POA: Diagnosis not present

## 2017-03-20 DIAGNOSIS — Z79899 Other long term (current) drug therapy: Secondary | ICD-10-CM

## 2017-03-20 DIAGNOSIS — I701 Atherosclerosis of renal artery: Secondary | ICD-10-CM

## 2017-03-20 DIAGNOSIS — E66811 Obesity, class 1: Secondary | ICD-10-CM

## 2017-03-20 NOTE — Progress Notes (Signed)
PCP: Kristopher Glee., MD  Clinic Note: Chief Complaint  Patient presents with  . Chest Pain    pt states having some sharp pain in her chest area last night   . Shortness of Breath    some     HPI: [name] is a 76 y.o. female with a PMH below who presents today for 2 month follow up again for recent evaluation for chest pain. Levern has a history of known CAD-PCI as well as chronic diastolic dysfunction and PAD /splanchnic artery disease.  CAD: pRCA & mLAD x 2 in 2011 (BMS stents used for preop back surgery), DES x 2 LAD for ISR  PAD: - RAS - s/p renal stent; celiac/SMA stenosis s/p SMA stent (recent)  Also has very difficult to control lipids with statin intolerance and refusal to take other medications. - Has been evaluated by our [facility] team - but stopped going  Chronic edema at least in part partially related to diastolic dysfunction, also related to venous insufficiency. - Usually controlled with Lasix and metolazone  [name] was last seen on 01/21/2017 - to evaluate a prolonged episode of chest pain following recent hospital discharge (status post celiac artery stenting) earlier in May. She also had a prolonged spell of irregular heartbeats or palpitations. He noted progressively worsening dyspnea. She was evaluated with Myoview stress test below.  Recent Hospitalizations: None  Studies Personally Reviewed - (if available, images/films reviewed: From Epic Chart or Care Everywhere)  [place] 01/24/2017: LOW RISK study. No ischemia or infarction. EF 65-75%.  Two-week event monitor June 2018: Mostly NSR with occasional sinus tachycardia and rare bradycardia. No A. fib. No PND or PSVT. Rare PACs and PVCs.  Interval History: [name] presents today overall not feeling well. She has been "laid up in bed all week long with GI issues" - not able to keep anything down. Has nausea with emesis. At least 2 big episodes of emesis. Also having some diarrhea. As a  result she is generally feeling weak and tired/lethargic. Hard to really assess any true cardiac issues at this point. She still feels some flip-flopping in her chest but not the same amount of palpitations that she had. She is not really commenting on any chest tightness or pressure at this time. Edema sees relatively controlled, mostly because she is probably dehydrated. No PND or orthopnea. She's felt dizzy and lightheaded, but no syncope or near syncope.   No TIA/amaurosis fugax symptoms.  No claudication.  ROS: A comprehensive was performed. Review of Systems  Constitutional: Positive for chills, fever and malaise/fatigue.  HENT: Negative for congestion and nosebleeds.   Respiratory: Negative for cough, shortness of breath and wheezing.   Gastrointestinal: Positive for abdominal pain, diarrhea, nausea and vomiting. Negative for blood in stool and melena.  Genitourinary: Negative for hematuria.  Musculoskeletal: Positive for back pain and joint pain. Negative for falls.  Neurological: Positive for dizziness and weakness (Generalized).  Psychiatric/Behavioral: The patient is nervous/anxious.   All other systems reviewed and are negative.  I have reviewed and (if needed) personally updated the patient's problem list, medications, allergies, past medical and surgical history, social and family history.   Past Medical History:  Diagnosis Date  . Arthritis    "middle finger right hand" (01/14/2017)  . Atrial fibrillation ([place])   . Back pain with radiation    Chronic Back Pain - mutliple surgeries (including tumor removal)  . Bilateral edema of lower extremity    Chronic,  likely related to venous stasis  . CAD S/P percutaneous coronary angioplasty    a) LHC: 07/30/10. -- 3.0x34m Integrity BMS to pRCA & 2.5x 169mIntegrity BMS mLAD(@ D2).  b) Class III-IV Angina 03/2011: LHC- ISR in LAD BMS -- prox overlapping Promus DES 2.5x1640mnd PTCA of jailed D2 ostium-prox 80%. c) 02/03/12:  LHC-  patent stents.  Jailed diagonal. with stable flow; d) Peri-OP NSTEMI 04/2012 - LHC in 12/'13 -   . Celiac artery stenosis ([facility]  12/2016 - STENT placement  . Complication of anesthesia    "used to wake up wild" (01/14/2017)  . Congestive heart failure with LV diastolic dysfunction, NYHA class 2 ([facility]  06/13/10:  last 2D echo-  EF >55%, Mild TR, Mod Conc LVH - Grade 1 diastolic dysfunction (abnormal relaxation) --> LVEDP on Cath 28 mmHg & mild 2nd Pulm HTN  . Diverticulitis of colon (without mention of hemorrhage)(562.11)   . Dyslipidemia, goal LDL below 70    Intolerant to statins  . GERD (gastroesophageal reflux disease)   . Hepatitis ~ 1957   "yellow jaundice" (01/14/2017)  . Hepatomegaly   . Hiatal hernia   . History of blood transfusion    "when I had a heart attack"  . History of kidney stones   . History of stomach ulcers "years ago"  . Irritable bowel syndrome (IBS)   . Labile essential hypertension    Partially related to RAS  . Mesenteric artery stenosis (HCC)    95% Celiac Artery - ostial, 20-30% SMA.  Bilateral Renal A: L RA stent patent, R RA 20-30% -- Conservative Management  . NSTEMI (non-ST elevated myocardial infarction) ([place]/2013   Unclear the details, apparently this was postoperative from her back surgery that she was cleared for my last saw her in June.  Reportedly had stents placed  . Pancreas divisum    On pancreatic enzyme  . Renal artery stenosis ([place]; 12/'13   a) Angiogram 02/03/12:  50-60%L RA stenosis, 40% R R Inferior artery; b) 12/'13: S/P L RA Stent ([facility]) 6.0 mm x 15 mm; c) Renal Duplex 10/2013: <60% L RA, <60 R RA, ~60% SMA & Celiac A.  . Stricture and stenosis of esophagus   . Type II diabetes mellitus ([facility]  "borderline; not on RX" (01/14/2017)  . Unspecified disorder of thyroid   . Unspecified gastritis and gastroduodenitis without mention of hemorrhage     Past Surgical History:  Procedure Laterality Date  . APPENDECTOMY    .  BACK SURGERY    . BREAST DUCTAL SYSTEM EXCISION     right  . BREAST SURGERY Right   . CARDIAC CATHETERIZATION N/A 03/16/2015   Procedure: Left Heart Cath and Coronary Angiography;  Surgeon: DavLeonie ManD;  Location: [facility];  Service: Cardiovascular; Widely patent m-dLAD stents as wellas pRCA stent.  High LVEDP, small Diag & Om vessels with moderate stenosis   . Cardiac Event Monitor  01/2017   Mostly NSR with occasional sinus tachycardia and rare bradycardia. No A. fib. No PND or PSVT. Rare PACs and PVCs.  . CMarland KitchenRDIOVASCULAR STRESS TEST     11/07/11:  Normal perfusion pattern.  EF 66% No wall motion abnormalities.   . CMarland KitchenTARACT EXTRACTION W/ INTRAOCULAR LENS  IMPLANT, BILATERAL Bilateral   . CHOLECYSTECTOMY OPEN    . COLONOSCOPY  03/01/2009   normal   . CORONARY ANGIOPLASTY WITH STENT PLACEMENT  07/2010, 02/2011   LHC:  07/30/10.  3.0x28m Integrity BMS to RCA and 2.5x 158mIntegrity BMS LAD.  03/2011: LHC- ISRS in LAD treated with overlapping Promus DES 2.5x1672mnd PTCA of Diagonal.  02/03/12:  LHC- patent stents.  Jailed diagonal.  . DOBUTAMINE STRESS ECHO  03/07/2015   [facility] (ordered for pre-op evaluation for EUS/ERCP --> abnormal EKG:  strreesss test shhoowwed 1 mm ST segment depressions downsloping. No wall motion abnormalities at peak exercise or at rest. Diastolic dysfunction was noted but normal systolic function - EF greater than 55%. No bouts of regurgitation or stenosis. Resting hypertension with exaggerated response  . ESOPHAGOGASTRODUODENOSCOPY  04/08/2012  . FOOT SURGERY Bilateral    "took bone out of 2nd toe on each foot"  . HIP SURGERY     left  . JOINT REPLACEMENT    . KNEE SURGERY Left    "had fluid drained off it a couple times"  . LEFT HEART CATHETERIZATION WITH CORONARY ANGIOGRAM N/A 02/03/2012   Procedure: LEFT HEART CATHETERIZATION WITH CORONARY ANGIOGRAM;  Surgeon: DavLeonie ManD;  [facility];  Service: Cardiovascular;  Laterality: N/A;  . LEFT  HEART CATHETERIZATION WITH CORONARY ANGIOGRAM N/A 05/12/2014   Procedure: LEFT HEART CATHETERIZATION WITH CORONARY ANGIOGRAM;  Surgeon: JonLorretta HarpD;  [facility];  Service: Cardiovascular: Stable CAD. Patent stents. Patent renal artery stent  . LEFT HEART CATHETERIZATION WITH CORONARY ANGIOGRAM  08/2012   Peri-Op MI @ [facility] -- 40% ostial D1, patent LAD stents, 10% RCA ISR  . LEFT HEART CATHETERIZATION WITH CORONARY ANGIOGRAM   03/2012   ~30% ISR RCA, patent LAD stent & D2; EDP ~28 mmHg  . NM MYOVIEW LTD  12/2016   LOW RISK study. No ischemia or infarction. EF 65-75%.  . PMarland KitchenNCREAS SURGERY     "stint in my pancreas"; Dr PetPershing Proud PERCUTANEOUS PINNING PHALANX FRACTURE OF HAND Left ~ 2013  . PERIPHERAL VASCULAR INTERVENTION  01/14/2017   Procedure: Peripheral Vascular Intervention;  Surgeon: BraSerafina MitchellD;  Location: [facility];  Service: Cardiovascular;;  mesentric  . RENAL ARTERY STENT Left 08/2012   @ [facility] - 6.0 mm x 15 mm  . SPINE SURGERY     tumor removed 07/2010; Redo Surgery 04/2012; Sacroiliac Sgx 08/2013  . TOTAL ABDOMINAL HYSTERECTOMY    . TOTAL KNEE ARTHROPLASTY Left ~ 2012  . VISCERAL ANGIOGRAM N/A 05/12/2014   Procedure: VISCERAL ANGIOGRAM;  Surgeon: JonLorretta HarpD;  [facility];  Service: Cardiovascular;  Laterality: N/A;  . VISCERAL ANGIOGRAPHY N/A 01/14/2017   Procedure: Mesenteric  Angiography;  Surgeon: BraSerafina MitchellD;  Location[facility];  Service: Cardiovascular;  Laterality: N/A;    Current Meds  Medication Sig  . acetaminophen (TYLENOL) 325 MG tablet Take 2 tablets (650 mg total) by mouth 3 (three) times daily as needed for mild pain or moderate pain.  . aMarland Kitchenitriptyline (ELAVIL) 25 MG tablet TAKE 1 TABLET(25 MG) BY MOUTH AT BEDTIME  . carvedilol (COREG) 6.25 MG tablet Take 6.25 mg by mouth 2 (two) times daily with a meal.  . clopidogrel (PLAVIX) 75 MG tablet Take 1 tablet (75 mg  total) by mouth daily.  . furosemide (LASIX) 80 MG tablet Take 1 tablet (80 mg total) by mouth 2 (two) times daily.  . gMarland Kitchenbapentin (NEURONTIN) 600 MG tablet Take 1,200 mg by mouth 3 (three) times daily.  . isosorbide mononitrate (IMDUR) 60 MG 24 hr tablet  Take 60 mg by mouth daily.  Marland Kitchen lidocaine (XYLOCAINE) 5 % ointment Apply 1 application topically at bedtime.   Marland Kitchen losartan (COZAAR) 50 MG tablet Take 1 tablet (50 mg total) by mouth daily.  . metolazone (ZAROXOLYN) 2.5 MG tablet Take 1 to 2 tablets  by mouth 30-60 minutes prior to morning dose of lasix AS NEEDED  . nitroGLYCERIN (NITROSTAT) 0.4 MG SL tablet DISSOLVE 1 TABLET UNDER THE TONGUE EVERY 5 MINUTES AS NEEDED FOR CHEST PAIN   Current Facility-Administered Medications for the 03/20/17 encounter (Office Visit) with Leonie Man, MD  Medication  . fentaNYL (SUBLIMAZE) injection 25 mcg    Allergies  Allergen Reactions  . Codeine Phosphate Anaphylaxis, Shortness Of Breath and Swelling  . Iodine Swelling and Rash    REACTION: unspecified This allergy is to Topical iodine only.   . Prednisone Itching and Rash    Keeps pt up all night   . Statins Other (See Comments)    Intolerant   . Tramadol Other (See Comments)    Kept patient awake    Social History   Social History  . Marital status: Married    Spouse name: N/A  . Number of children: 1  . Years of education: N/A   Occupational History  . gift shop owner    Social History Main Topics  . Smoking status: Never Smoker  . Smokeless tobacco: Never Used  . Alcohol use No  . Drug use: No  . Sexual activity: Not Currently   Other Topics Concern  . None   Social History Narrative   Married mother of one, one grandchild.   Very socially active, enjoys cooking and having get-togethers her house. She had been exercising regularly but her back pain limits her.   Does not smoke, does not drink alcohol.    family history includes Cancer in her father; Colitis in her  maternal grandfather; Diabetes in her brother; Heart attack in her brother, brother, brother, and father; Heart disease in her father; Stroke in her brother and mother.  Wt Readings from Last 3 Encounters:  03/20/17 215 lb 9.6 oz (97.8 kg)  03/03/17 226 lb (102.5 kg)  02/24/17 228 lb (103.4 kg)    PHYSICAL EXAM BP (!) 149/78   Pulse 88   Ht 5' 7.5" (1.715 m)   Wt 215 lb 9.6 oz (97.8 kg)   BMI 33.27 kg/m  General appearance: Alert, cooperative. Seems somewhat ill-appearing, nontoxic. Mild to moderately obese. Well-nourished well-groomed. HEENT: [place]/AT, EOMI, MMM, anicteric sclera Neck: no adenopathy, no carotid bruit and no JVD Lungs: CTAB normal percussion bilaterally and non-labored Heart: RRR, Normal S1 &S2, Soft S4 gallop. 1/6 SEM@ RUSB --> carotids; No Rub. Non-displaced PMI Abdomen: soft, hyperactive bowel sounds normal; no masses,  no organomegaly; no HJR (mild epigastric tenderness) Extremities: extremities normal, atraumatic, no cyanosis; edema - trace Pulses: 2+ and symmetric;  Skin: mobility and turgor normal, no evidence of bleeding or bruising and no lesions noted  Neurologic: Mental status: Alert & oriented x 3, thought content appropriate; non-focal exam.  Pleasant mood & affect.    Adult ECG Report  Rate: 88 ;  Rhythm: normal sinus rhythm and Inferior and lateral lead ST changes, cannot exclude ischemia. - Most consistent with repolarization abnormalities. Otherwise normal intervals and durations.;   Narrative Interpretation: Stable EKG   Other studies Reviewed: Additional studies/ records that were reviewed today include:  Recent Labs:   Lab Results  Component Value Date   CHOL 219 (H)  05/07/2016   HDL 34 (L) 05/07/2016   LDLCALC 124 05/07/2016   TRIG 303 (H) 05/07/2016   CHOLHDL 6.4 (H) 05/07/2016   Lab Results  Component Value Date   CREATININE 0.70 01/14/2017   BUN 23 (H) 01/14/2017   NA 136 01/14/2017   K 2.8 (L) 01/14/2017   CL 92 (L) 01/14/2017    CO2 31 10/24/2016    ASSESSMENT / PLAN: Problem List Items Addressed This Visit    CAD S/P percutaneous coronary angioplasty: pRCA BMS, mLAD BMS overlapped prox with DES for ISR (Chronic)    Last cath was in July 2016: This was done for an abnormal stress test done elsewhere. It showed widely patent stents. Simply elevated LVEDP and small vessel disease. Line she now has a Myoview that is also negative for ischemia. We will continue Plavix without aspirin along with current dose of beta blocker and ARB. She is on Imdur standing dose. If symptoms worsen she can double.  She may be having some diastolic dysfunction mediated endocardial ischemia from volume overload, she is on standing dose of Lasix with when necessary Zaroxolyn.      Chest pain with moderate risk for cardiac etiology (Chronic)    She is always having some type of chest pain or pressure. She always calls it her angina pain. She did have one time when she came in and had stenosis of her LAD stent requiring overlapping PCI. Otherwise her since been widely patent on catheterizations, and her stress test within normal. She may have microvascular ischemia in the endocardium related to diastolic dysfunction and hypertension.  Need to assess her when she is not feeling poorly, but we may be able to titrate up her carvedilol dose (she is not LDL in the past). With her having the palpitations, this may be helpful. Could also potentially consider increasing losartan for additional afterload reduction. I indicated that if she is having some more discomfort she should take an additional dose of her Imdur for now.  - Continue reassess her blood pressure in CV RR when she comes in for lipid panel.      Congestive heart failure with LV diastolic dysfunction, NYHA class 2 (HCC) (Chronic)    Seems euvolemic. Clearly has had elevated diastolic pressures on cardiac cath as well as on echo. She has some mild secondary pulmonary hypertension. She is  on ARB and beta blocker 2 doses, we can potentially increase for additional afterload reduction. She is on stable dose of Lasix and Zaroxolyn. Discussed PRN Sliding Scale Lasix/Zaroxolyn      Dyslipidemia, goal LDL below 70 - WITH STATIN INTOLERANCE (Chronic)    This is a very difficult thing to manage him, because she is so reluctant to take any medications. I'm going to recheck a lipid panel on her when she is feeling better in about a month. We'll then have her return to CV[facility] to discuss with her pharmacist and the potential for [place] or [place].  I'm consulted concerned that she is progressing or coronary disease since her not able to adequately treat.      Essential hypertension (Chronic)    She does have some mild renal artery stenosis, not significant. Tolerating Cozaar 50 mg. We tried to increase it before but she did not tolerate. Plan for now, since she is not feeling well is to just leave her medicines alone, but when she sees CT met CV [facility] for her lipids, I would investigate consider potentially titrating up carvedilol versus ARB.  Relevant Orders   EKG 12-Lead (Completed)   Exertional dyspnea (Chronic)    Probably related to deconditioning and obesity as well as maybe some mild diastolic dysfunction. Negative Myoview. Mostly chronic. She needs to exercise and lose weight. Continue to build titrate blood pressure medicines if she will let us.      Obesity (BMI 30.0-34.9) (Chronic)    The patient understands the need to lose weight with diet and exercise. We have discussed specific strategies for this.      Stasis edema of bilateral lower extremity (Chronic)    Since she doesn't really have PND and orthopnea, or edema is mostly related to venous stasis. Not really diastolic heart failure. Usually well controlled with Lasix and Zaroxolyn. Obviously with her not feeling well, she probably doesn't want take the Zaroxolyn to avoid dehydration. Need to use support stockings  and encourage ambulation. Also encourage elevating her legs.       Other Visit Diagnoses    Medication management    -  Primary   Relevant Orders   Lipid panel     Zaidee is always relatively complicated and has multiple complaints with numerous chronic conditions that need to be followed closely. As usual, more than 40 minutes was spent with  > 50% of time spent in direct consultation with the patient.  Current medicines are reviewed at length with the patient today. (+/- concerns) n/a The following changes have been made: n/a  Patient Instructions  Medication Instructions: No changes  Labwork: Your physician recommends that you return for fasting Lipid panel before your one month appointment with CVRR.   Follow-Up: Your physician recommends that you schedule a follow-up appointment in one month with CV[facility]  Your physician wants you to follow-up in: 6 months with Dr. Ellyn Hack. You will receive a reminder letter in the mail two months in advance. If you don't receive a letter, please call our office to schedule the follow-up appointment.    If you need a refill on your cardiac medications before your next appointment, please call your pharmacy.   -- CV RR - also plan to monitor blood pressure and titrate up beta blocker versus ARB.  Studies Ordered:   Orders Placed This Encounter  Procedures  . Lipid panel  . EKG 12-Lead      Glenetta Hew, M.D., M.S. Interventional Cardiologist   Pager # [phone] Phone # [phone] [address] [place], [place] [address]

## 2017-03-20 NOTE — Patient Instructions (Signed)
Medication Instructions: No changes  Labwork: Your physician recommends that you return for fasting Lipid panel before your one month appointment with CV[facility].   Follow-Up: Your physician recommends that you schedule a follow-up appointment in one month with CV[facility]  Your physician wants you to follow-up in: 6 months with Dr. Ellyn Hack. You will receive a reminder letter in the mail two months in advance. If you don't receive a letter, please call our office to schedule the follow-up appointment.    If you need a refill on your cardiac medications before your next appointment, please call your pharmacy.

## 2017-03-22 ENCOUNTER — Encounter: Payer: Self-pay | Admitting: Cardiology

## 2017-03-22 NOTE — Assessment & Plan Note (Signed)
This is a very difficult thing to manage him, because she is so reluctant to take any medications. I'm going to recheck a lipid panel on her when she is feeling better in about a month. We'll then have her return to [facility] to discuss with her pharmacist and the potential for [place] or [place].  I'm consulted concerned that she is progressing or coronary disease since her not able to adequately treat.

## 2017-03-22 NOTE — Assessment & Plan Note (Signed)
Since she doesn't really have PND and orthopnea, or edema is mostly related to venous stasis. Not really diastolic heart failure. Usually well controlled with Lasix and Zaroxolyn. Obviously with her not feeling well, she probably doesn't want take the Zaroxolyn to avoid dehydration. Need to use support stockings and encourage ambulation. Also encourage elevating her legs.

## 2017-03-22 NOTE — Assessment & Plan Note (Signed)
She does have some mild renal artery stenosis, not significant. Tolerating Cozaar 50 mg. We tried to increase it before but she did not tolerate. Plan for now, since she is not feeling well is to just leave her medicines alone, but when she sees CT met CV RRR for her lipids, I would investigate consider potentially titrating up carvedilol versus ARB.

## 2017-03-22 NOTE — Assessment & Plan Note (Signed)
The patient understands the need to lose weight with diet and exercise. We have discussed specific strategies for this.  

## 2017-03-22 NOTE — Assessment & Plan Note (Signed)
Seems euvolemic. Clearly has had elevated diastolic pressures on cardiac cath as well as on echo. She has some mild secondary pulmonary hypertension. She is on ARB and beta blocker 2 doses, we can potentially increase for additional afterload reduction. She is on stable dose of Lasix and Zaroxolyn. Discussed PRN Sliding Scale Lasix/Zaroxolyn

## 2017-03-22 NOTE — Assessment & Plan Note (Signed)
Probably related to deconditioning and obesity as well as maybe some mild diastolic dysfunction. Negative Myoview. Mostly chronic. She needs to exercise and lose weight. Continue to build titrate blood pressure medicines if she will let us.

## 2017-03-22 NOTE — Assessment & Plan Note (Signed)
She is always having some type of chest pain or pressure. She always calls it her angina pain. She did have one time when she came in and had stenosis of her LAD stent requiring overlapping PCI. Otherwise her since been widely patent on catheterizations, and her stress test within normal. She may have microvascular ischemia in the endocardium related to diastolic dysfunction and hypertension.  Need to assess her when she is not feeling poorly, but we may be able to titrate up her carvedilol dose (she is not LDL in the past). With her having the palpitations, this may be helpful. Could also potentially consider increasing losartan for additional afterload reduction. I indicated that if she is having some more discomfort she should take an additional dose of her Imdur for now.  - Continue reassess her blood pressure in CV RR when she comes in for lipid panel.

## 2017-03-22 NOTE — Assessment & Plan Note (Signed)
Last cath was in July 2016: This was done for an abnormal stress test done elsewhere. It showed widely patent stents. Simply elevated LVEDP and small vessel disease. Line she now has a Myoview that is also negative for ischemia. We will continue Plavix without aspirin along with current dose of beta blocker and ARB. She is on Imdur standing dose. If symptoms worsen she can double.  She may be having some diastolic dysfunction mediated endocardial ischemia from volume overload, she is on standing dose of Lasix with when necessary Zaroxolyn.

## 2017-04-09 ENCOUNTER — Other Ambulatory Visit: Payer: Self-pay | Admitting: Cardiology

## 2017-04-10 DIAGNOSIS — M47812 Spondylosis without myelopathy or radiculopathy, cervical region: Secondary | ICD-10-CM | POA: Diagnosis not present

## 2017-04-10 DIAGNOSIS — M542 Cervicalgia: Secondary | ICD-10-CM | POA: Diagnosis not present

## 2017-04-10 DIAGNOSIS — M791 Myalgia: Secondary | ICD-10-CM | POA: Diagnosis not present

## 2017-04-10 DIAGNOSIS — M47816 Spondylosis without myelopathy or radiculopathy, lumbar region: Secondary | ICD-10-CM | POA: Diagnosis not present

## 2017-04-17 DIAGNOSIS — M2041 Other hammer toe(s) (acquired), right foot: Secondary | ICD-10-CM | POA: Diagnosis not present

## 2017-04-17 DIAGNOSIS — E1142 Type 2 diabetes mellitus with diabetic polyneuropathy: Secondary | ICD-10-CM | POA: Diagnosis not present

## 2017-04-17 DIAGNOSIS — B351 Tinea unguium: Secondary | ICD-10-CM | POA: Diagnosis not present

## 2017-04-18 ENCOUNTER — Other Ambulatory Visit: Payer: Self-pay | Admitting: Cardiology

## 2017-04-23 ENCOUNTER — Ambulatory Visit: Payer: Medicare Other

## 2017-04-29 ENCOUNTER — Other Ambulatory Visit: Payer: Self-pay | Admitting: Cardiology

## 2017-05-07 ENCOUNTER — Ambulatory Visit (INDEPENDENT_AMBULATORY_CARE_PROVIDER_SITE_OTHER): Payer: Medicare Other | Admitting: Cardiology

## 2017-05-07 ENCOUNTER — Encounter: Payer: Self-pay | Admitting: Cardiology

## 2017-05-07 VITALS — BP 130/60 | HR 84 | Ht 68.0 in | Wt 201.0 lb

## 2017-05-07 DIAGNOSIS — I87302 Chronic venous hypertension (idiopathic) without complications of left lower extremity: Secondary | ICD-10-CM

## 2017-05-07 DIAGNOSIS — R002 Palpitations: Secondary | ICD-10-CM

## 2017-05-07 DIAGNOSIS — I701 Atherosclerosis of renal artery: Secondary | ICD-10-CM

## 2017-05-07 DIAGNOSIS — I774 Celiac artery compression syndrome: Secondary | ICD-10-CM | POA: Diagnosis not present

## 2017-05-07 DIAGNOSIS — I2510 Atherosclerotic heart disease of native coronary artery without angina pectoris: Secondary | ICD-10-CM

## 2017-05-07 DIAGNOSIS — I771 Stricture of artery: Secondary | ICD-10-CM

## 2017-05-07 DIAGNOSIS — Z79899 Other long term (current) drug therapy: Secondary | ICD-10-CM | POA: Diagnosis not present

## 2017-05-07 DIAGNOSIS — E669 Obesity, unspecified: Secondary | ICD-10-CM | POA: Diagnosis not present

## 2017-05-07 DIAGNOSIS — Z9861 Coronary angioplasty status: Secondary | ICD-10-CM

## 2017-05-07 DIAGNOSIS — I5030 Unspecified diastolic (congestive) heart failure: Secondary | ICD-10-CM

## 2017-05-07 DIAGNOSIS — E785 Hyperlipidemia, unspecified: Secondary | ICD-10-CM | POA: Diagnosis not present

## 2017-05-07 LAB — LIPID PANEL
CHOL/HDL RATIO: 6.8 ratio — AB (ref 0.0–4.4)
Cholesterol, Total: 238 mg/dL — ABNORMAL HIGH (ref 100–199)
HDL: 35 mg/dL — AB (ref >39)
LDL Calculated: 130 mg/dL — ABNORMAL HIGH (ref 0–99)
Triglycerides: 365 mg/dL — ABNORMAL HIGH (ref 0–149)
VLDL CHOLESTEROL CAL: 73 mg/dL — AB (ref 5–40)

## 2017-05-07 NOTE — Progress Notes (Signed)
PCP: Kristopher Glee., MD  Clinic Note: Chief Complaint  Patient presents with  . Follow-up    2 months  . Coronary Artery Disease    HPI: [name] is a 76 y.o. female with a PMH below who presents today for 13month f/u (was supposed to see [facility]). [name] has a history of known CAD-PCI as well as chronic diastolic dysfunction and PAD /splanchnic artery disease.  CAD: pRCA & mLAD x 2 in 2011 (BMS stents used for preop back surgery), DES x 2 LAD for ISR PAD: - RAS - s/p renal stent; celiac/SMA stenosis s/p SMA stent (recent)  Also has very difficult to control lipids with statin intolerance and refusal to take other medications. - Has been evaluated by our [place] team - but stopped going  Chronic edema at least in part partially related to diastolic dysfunction, also related to venous insufficiency. - Usually controlled with Lasix and metolazone  KONSTANTINA NACHREINER was last seen on 03/20/2017 following up from Myoview stress test in May 2018 that was low risk. Also to monitor which showed no A. fib and no significant arrhythmias. Biggest issue she had at that time was persistent GI issues with loose stools nausea and emesis. She was lethargic tired and fatigued. Was noticing dizziness. She was noting palpitations.  Recent Hospitalizations: None  Studies Personally Reviewed - (if available, images/films reviewed: From Epic Chart or Care Everywhere)  Cardiac event monitor May 2018. Pretty normal with no heart rhythm issues. No significant irregularities  Interval History:  [name] returns today a bit confused, since she had just been seen. She really has no active cardiac symptoms, in fact since I last saw her, she has barely been out of the house. She has been continuously having her GI issues with loose stools nausea and emesis. She has not been able to keep down any normal amount of food. Despite this, she continues to be active doing her jarring in standing activities. She also  continues to cook.  She says her swelling has been well controlled, mostly because she is not eating and drinking very much. She's not having to use any additional Lasix. She is using the metolazone intermittently as well however. She is not really noticing any PND or orthopnea. She is not noticing any resting or exertional chest tightness or pressure. She still does have some palpitations, but no rapid irregular heartbeats. Although she feels tired and fatigued, she is not noticing any syncope or near syncope type symptoms. No TIA or amaurosis fugax symptoms.   No claudication.  ROS: A comprehensive was performed. Review of Systems  Constitutional: Positive for malaise/fatigue.       Less notable fevers and chills.  Respiratory: Negative for cough, shortness of breath and wheezing.   Gastrointestinal: Positive for nausea and vomiting.       Loose stools and abdominal pain  Genitourinary: Negative for dysuria, frequency and hematuria.  Musculoskeletal: Positive for joint pain and myalgias.  Neurological: Positive for dizziness and weakness (Generalized).  Psychiatric/Behavioral: The patient is nervous/anxious and has insomnia.   All other systems reviewed and are negative.   I have reviewed and (if needed) personally updated the patient's problem list, medications, allergies, past medical and surgical history, social and family history.   Past Medical History:  Diagnosis Date  . Arthritis    "middle finger right hand" (01/14/2017)  . Atrial fibrillation ([place])   . Back pain with radiation    Chronic Back Pain - mutliple  surgeries (including tumor removal)  . Bilateral edema of lower extremity    Chronic, likely related to venous stasis  . CAD S/P percutaneous coronary angioplasty    a) LHC: 07/30/10. -- 3.0x62mm Integrity BMS to pRCA & 2.5x 27mm Integrity BMS mLAD(@ D2).  b) Class III-IV Angina 03/2011: LHC- ISR in LAD BMS -- prox overlapping Promus DES 2.5x32mm and PTCA of jailed D2  ostium-prox 80%. c) 02/03/12:  LHC- patent stents.  Jailed diagonal. with stable flow; d) Peri-OP NSTEMI 04/2012 - LHC in 12/'13 -   . Celiac artery stenosis ([facility])    12/2016 - STENT placement  . Complication of anesthesia    "used to wake up wild" (01/14/2017)  . Congestive heart failure with LV diastolic dysfunction, NYHA class 2 ([place])    06/13/10:  last 2D echo-  EF >55%, Mild TR, Mod Conc LVH - Grade 1 diastolic dysfunction (abnormal relaxation) --> LVEDP on Cath 28 mmHg & mild 2nd Pulm HTN  . Diverticulitis of colon (without mention of hemorrhage)(562.11)   . Dyslipidemia, goal LDL below 70    Intolerant to statins  . GERD (gastroesophageal reflux disease)   . Hepatitis ~ 1957   "yellow jaundice" (01/14/2017)  . Hepatomegaly   . Hiatal hernia   . History of blood transfusion    "when I had a heart attack"  . History of kidney stones   . History of stomach ulcers "years ago"  . Irritable bowel syndrome (IBS)   . Labile essential hypertension    Partially related to RAS  . Mesenteric artery stenosis (HCC)    95% Celiac Artery - ostial, 20-30% SMA.  Bilateral Renal A: L RA stent patent, R RA 20-30% -- Conservative Management  . NSTEMI (non-ST elevated myocardial infarction) ([place]) 04/2012   Unclear the details, apparently this was postoperative from her back surgery that she was cleared for my last saw her in June.  Reportedly had stents placed  . Pancreas divisum    On pancreatic enzyme  . Renal artery stenosis ([place]) 2011; 12/'13   a) Angiogram 02/03/12:  50-60%L RA stenosis, 40% R R Inferior artery; b) 12/'13: S/P L RA Stent ([facility]) 6.0 mm x 15 mm; c) Renal Duplex 10/2013: <60% L RA, <60 R RA, ~60% SMA & Celiac A.  . Stricture and stenosis of esophagus   . Type II diabetes mellitus ([place])    "borderline; not on RX" (01/14/2017)  . Unspecified disorder of thyroid   . Unspecified gastritis and gastroduodenitis without mention of hemorrhage     Past Surgical History:  Procedure  Laterality Date  . APPENDECTOMY    . BACK SURGERY    . BREAST DUCTAL SYSTEM EXCISION     right  . BREAST SURGERY Right   . CARDIAC CATHETERIZATION N/A 03/16/2015   Procedure: Left Heart Cath and Coronary Angiography;  Surgeon: Leonie Man, MD;  Location: [facility];  Service: Cardiovascular; Widely patent m-dLAD stents as wellas pRCA stent.  High LVEDP, small Diag & Om vessels with moderate stenosis   . Cardiac Event Monitor  01/2017   Mostly NSR with occasional sinus tachycardia and rare bradycardia. No A. fib. No PND or PSVT. Rare PACs and PVCs.  Marland Kitchen CARDIOVASCULAR STRESS TEST     11/07/11:  Normal perfusion pattern.  EF 66% No wall motion abnormalities.   Marland Kitchen CATARACT EXTRACTION W/ INTRAOCULAR LENS  IMPLANT, BILATERAL Bilateral   . CHOLECYSTECTOMY OPEN    . COLONOSCOPY  03/01/2009  normal   . CORONARY ANGIOPLASTY WITH STENT PLACEMENT  07/2010, 02/2011   LHC: 07/30/10.  3.0x27mm Integrity BMS to RCA and 2.5x 40mm Integrity BMS LAD.  03/2011: LHC- ISRS in LAD treated with overlapping Promus DES 2.5x89mm and PTCA of Diagonal.  02/03/12:  LHC- patent stents.  Jailed diagonal.  . DOBUTAMINE STRESS ECHO  03/07/2015   [facility] (ordered for pre-op evaluation for EUS/ERCP --> abnormal EKG:  strreesss test shhoowwed 1 mm ST segment depressions downsloping. No wall motion abnormalities at peak exercise or at rest. Diastolic dysfunction was noted but normal systolic function - EF greater than 55%. No bouts of regurgitation or stenosis. Resting hypertension with exaggerated response  . ESOPHAGOGASTRODUODENOSCOPY  04/08/2012  . FOOT SURGERY Bilateral    "took bone out of 2nd toe on each foot"  . HIP SURGERY     left  . JOINT REPLACEMENT    . KNEE SURGERY Left    "had fluid drained off it a couple times"  . LEFT HEART CATHETERIZATION WITH CORONARY ANGIOGRAM N/A 02/03/2012   Procedure: LEFT HEART CATHETERIZATION WITH CORONARY ANGIOGRAM;  Surgeon: Leonie Man, MD;  Location: [facility] CATH LAB;  Service:  Cardiovascular;  Laterality: N/A;  . LEFT HEART CATHETERIZATION WITH CORONARY ANGIOGRAM N/A 05/12/2014   Procedure: LEFT HEART CATHETERIZATION WITH CORONARY ANGIOGRAM;  Surgeon: Lorretta Harp, MD;  Location: [facility] CATH LAB;  Service: Cardiovascular: Stable CAD. Patent stents. Patent renal artery stent  . LEFT HEART CATHETERIZATION WITH CORONARY ANGIOGRAM  08/2012   Peri-Op MI @ [facility] -- 40% ostial D1, patent LAD stents, 10% RCA ISR  . LEFT HEART CATHETERIZATION WITH CORONARY ANGIOGRAM   03/2012   ~30% ISR RCA, patent LAD stent & D2; EDP ~28 mmHg  . NM MYOVIEW LTD  12/2016   LOW RISK study. No ischemia or infarction. EF 65-75%.  Marland Kitchen PANCREAS SURGERY     "stint in my pancreas"; Dr Pershing Proud  . PERCUTANEOUS PINNING PHALANX FRACTURE OF HAND Left ~ 2013  . PERIPHERAL VASCULAR INTERVENTION  01/14/2017   Procedure: Peripheral Vascular Intervention;  Surgeon: Serafina Mitchell, MD;  Location: [facility];  Service: Cardiovascular;;  mesentric  . RENAL ARTERY STENT Left 08/2012   @ [facility] - 6.0 mm x 15 mm  . SPINE SURGERY     tumor removed 07/2010; Redo Surgery 04/2012; Sacroiliac Sgx 08/2013  . TOTAL ABDOMINAL HYSTERECTOMY    . TOTAL KNEE ARTHROPLASTY Left ~ 2012  . VISCERAL ANGIOGRAM N/A 05/12/2014   Procedure: VISCERAL ANGIOGRAM;  Surgeon: Lorretta Harp, MD;  Location[facility] C[facility] LAB;  Service: Cardiovascular;  Laterality: N/A;  . VISCERAL ANGIOGRAPHY N/A 01/14/2017   Procedure: Mesenteric  Angiography;  Surgeon: Serafina Mitchell, MD;  Location: [facility];  Service: Cardiovascular;  Laterality: N/A;    Current Meds  Medication Sig  . acetaminophen (TYLENOL) 325 MG tablet Take 2 tablets (650 mg total) by mouth 3 (three) times daily as needed for mild pain or moderate pain.  Marland Kitchen amitriptyline (ELAVIL) 25 MG tablet TAKE 1 TABLET(25 MG) BY MOUTH AT BEDTIME  . carvedilol (COREG) 6.25 MG tablet Take 6.25 mg by mouth 2 (two) times daily with a meal.  . clopidogrel  (PLAVIX) 75 MG tablet TAKE 1 TABLET(75 MG) BY MOUTH DAILY  . furosemide (LASIX) 40 MG tablet TAKE TWO TABLETS BY MOUTH IN THE MORNING AND TAKE ONE TABLET IN THE EVENING  . gabapentin (NEURONTIN) 600 MG tablet Take 1,200 mg  by mouth as needed.   . isosorbide mononitrate (IMDUR) 30 MG 24 hr tablet TAKE 1 TABLET(30 MG) BY MOUTH EVERY MORNING  . isosorbide mononitrate (IMDUR) 60 MG 24 hr tablet Take 60 mg by mouth daily.  Marland Kitchen lidocaine (XYLOCAINE) 5 % ointment Apply 1 application topically at bedtime.   Marland Kitchen losartan (COZAAR) 50 MG tablet TAKE 1 TABLET(50 MG) BY MOUTH DAILY  . metolazone (ZAROXOLYN) 2.5 MG tablet Take 1 to 2 tablets  by mouth 30-60 minutes prior to morning dose of lasix AS NEEDED  . nitroGLYCERIN (NITROSTAT) 0.4 MG SL tablet DISSOLVE 1 TABLET UNDER THE TONGUE EVERY 5 MINUTES AS NEEDED FOR CHEST PAIN   Current Facility-Administered Medications for the 05/07/17 encounter (Office Visit) with Leonie Man, MD  Medication  . fentaNYL (SUBLIMAZE) injection 25 mcg    Allergies  Allergen Reactions  . Codeine Phosphate Anaphylaxis, Shortness Of Breath and Swelling  . Iodine Swelling and Rash    REACTION: unspecified This allergy is to Topical iodine only.   . Prednisone Itching and Rash    Keeps pt up all night   . Statins Other (See Comments)    Intolerant   . Tramadol Other (See Comments)    Kept patient awake    Social History   Social History  . Marital status: Married    Spouse name: N/A  . Number of children: 1  . Years of education: N/A   Occupational History  . gift shop owner    Social History Main Topics  . Smoking status: Never Smoker  . Smokeless tobacco: Never Used  . Alcohol use No  . Drug use: No  . Sexual activity: Not Currently   Other Topics Concern  . None   Social History Narrative   Married mother of one, one grandchild.   Very socially active, enjoys cooking and having get-togethers her house. She had been exercising regularly but her back  pain limits her.   Does not smoke, does not drink alcohol.    family history includes Cancer in her father; Colitis in her maternal grandfather; Diabetes in her brother; Heart attack in her brother, brother, brother, and father; Heart disease in her father; Stroke in her brother and mother.  Wt Readings from Last 3 Encounters:  05/07/17 201 lb (91.2 kg)  03/20/17 215 lb 9.6 oz (97.8 kg)  03/03/17 226 lb (102.5 kg)    PHYSICAL EXAM BP 130/60   Pulse 84   Ht 5\' 8"  (1.727 m)   Wt 201 lb (91.2 kg)   BMI 30.56 kg/m  Physical Exam  Constitutional: She is oriented to person, place, and time. She appears well-developed and well-nourished. No distress (Tired and ill-appearing. But not toxic.).  HENT:  Head: Normocephalic and atraumatic.  Eyes: Pupils are equal, round, and reactive to light. EOM are normal.  Neck: Normal range of motion. Neck supple. No hepatojugular reflux and no JVD present. Carotid bruit is not present.  Cardiovascular: Normal rate, regular rhythm, S1 normal, S2 normal and intact distal pulses.   Occasional extrasystoles are present. PMI is not displaced.  Exam reveals gallop, S4 and distant heart sounds.   Murmur heard.  Medium-pitched harsh crescendo-decrescendo early systolic murmur is present with a grade of 1/6  at the upper right sternal border radiating to the neck Pulmonary/Chest: Effort normal and breath sounds normal. No respiratory distress. She has no wheezes. She has no rales.  Abdominal: Soft. Bowel sounds are normal. She exhibits no distension. There is no tenderness.  There is no rebound.  Musculoskeletal: Normal range of motion. She exhibits edema (Trivial).  Lymphadenopathy:    She has no cervical adenopathy.  Neurological: She is alert and oriented to person, place, and time.  Skin: Skin is warm and dry. No rash noted. No erythema. No pallor.  Psychiatric: She has a normal mood and affect. Her behavior is normal. Judgment and thought content normal.    Answers questions appropriately  Nursing note and vitals reviewed.   Adult ECG Report n/a  Other studies Reviewed: Additional studies/ records that were reviewed today include:  Recent Labs:    Lab Results  Component Value Date   CHOL 238 (H) 05/07/2017   HDL 35 (L) 05/07/2017   LDLCALC 130 (H) 05/07/2017   TRIG 365 (H) 05/07/2017   CHOLHDL 6.8 (H) 05/07/2017    ASSESSMENT / PLAN: Problem List Items Addressed This Visit    CAD S/P percutaneous coronary angioplasty: pRCA BMS, mLAD BMS overlapped prox with DES for ISR - Primary (Chronic)    Last July 2016 done for an abnormal stress test that showed widely patent stents. She had elevated LVEDP and small vessel disease. Subsequently, her Myoview this may is negative for ischemia.  She actually is not complaining of any chest pain now. Continue Plavix without aspirin along with beta blocker and ARB. Continue Imdur for microvascular disease. Continue to treat diastolic dysfunction with diuretic at current doses. I suspect that she probably just needs to use the metolazone more frequently. -- However with her recent illnesses, she has not really been eating and drinking as much and therefore is not requiring as much diuretic.      Celiac artery stenosis: 60% by Duplex - 90-95% by cath - Med Rx (Chronic)    Dr. Trula Slade finally did do visceral angioplasty.  Unfortunately, doesn't seem that her symptoms have improved much.      Congestive heart failure with LV diastolic dysfunction, NYHA class 2 (HCC) (Chronic)    Euvolemic today. Legs don't show much the way of swelling. Continue current medications.  We discussed sliding scale Lasix/Zaroxolyn.  Not requiring as much based on recent illness.      Dyslipidemia, goal LDL below 70 - WITH STATIN INTOLERANCE (Chronic)    Difficult situation. She has been reluctant to use any medication. There was intention to try Lovaza, but she does not appear to be on that nor Zetia.  The plan was for  her to see our pharmacist run lipid clinic ([facility]), but she would prefer to come in when she is "feeling better".  Recommend that she calls her office to schedule a follow-up with them and has labs drawn prior to that visit.      Obesity (BMI 30.0-34.9) (Chronic)    Clearly unintentional weight loss of 25 pounds since July. Unfortunately this was related to illness, however hopefully as she starts to feel better, we can maintain this weight loss.      Palpitation (Chronic)    At this point I think her palpitations are probably more related to her GI illness. Her event monitor did not show any arrhythmias. Continue current dose of beta blocker.        Stasis edema of bilateral lower extremity (Chronic)    I suspect a lot of her edema is related to lower extremity venous stasis more than diastolic dysfunction.  With her current illness. To avoid dehydration and therefore we are backing off on the aggression of diuresis. She does wear support stockings  which is on her feet. Also need to elevate legs.         Current medicines are reviewed at length with the patient today. (+/- concerns) n/a The following changes have been made: n/a  Patient Instructions  NO CHANGES IN [place] BETTER TO DISCUSS CHO STEROL WITH [facility]  LABS TODAY  Your physician wants you to follow-up in  6 MONTHS  You will receive a reminder letter in the mail two months in advance. If you don't receive a letter, please call our office to schedule the follow-up appointment.    Studies Ordered:   No orders of the defined types were placed in this encounter.     Glenetta Hew, M.D., M.S. Interventional Cardiologist   Pager # [phone] Phone # [phone] [address] [place], [place] [address]

## 2017-05-07 NOTE — Patient Instructions (Addendum)
NO CHANGES IN MEDICAITONS   CALL WHEN YOU FEEL BETTER TO DISCUSS CHO STEROL WITH [facility]  LABS TODAY  Your physician wants you to follow-up in  6 MONTHS  You will receive a reminder letter in the mail two months in advance. If you don't receive a letter, please call our office to schedule the follow-up appointment.

## 2017-05-10 ENCOUNTER — Encounter: Payer: Self-pay | Admitting: Cardiology

## 2017-05-10 NOTE — Assessment & Plan Note (Signed)
I suspect a lot of her edema is related to lower extremity venous stasis more than diastolic dysfunction.  With her current illness. To avoid dehydration and therefore we are backing off on the aggression of diuresis. She does wear support stockings which is on her feet. Also need to elevate legs.

## 2017-05-10 NOTE — Assessment & Plan Note (Signed)
Euvolemic today. Legs don't show much the way of swelling. Continue current medications.  We discussed sliding scale Lasix/Zaroxolyn.  Not requiring as much based on recent illness.

## 2017-05-10 NOTE — Assessment & Plan Note (Signed)
At this point I think her palpitations are probably more related to her GI illness. Her event monitor did not show any arrhythmias. Continue current dose of beta blocker.

## 2017-05-10 NOTE — Assessment & Plan Note (Signed)
Dr. Trula Slade finally did do visceral angioplasty.  Unfortunately, doesn't seem that her symptoms have improved much.

## 2017-05-10 NOTE — Assessment & Plan Note (Signed)
Clearly unintentional weight loss of 25 pounds since July. Unfortunately this was related to illness, however hopefully as she starts to feel better, we can maintain this weight loss.

## 2017-05-10 NOTE — Assessment & Plan Note (Signed)
Difficult situation. She has been reluctant to use any medication. There was intention to try Lovaza, but she does not appear to be on that nor Zetia.  The plan was for her to see our pharmacist run lipid clinic ([facility]), but she would prefer to come in when she is "feeling better".  Recommend that she calls her office to schedule a follow-up with them and has labs drawn prior to that visit.

## 2017-05-10 NOTE — Assessment & Plan Note (Signed)
Last July 2016 done for an abnormal stress test that showed widely patent stents. She had elevated LVEDP and small vessel disease. Subsequently, her Myoview this may is negative for ischemia.  She actually is not complaining of any chest pain now. Continue Plavix without aspirin along with beta blocker and ARB. Continue Imdur for microvascular disease. Continue to treat diastolic dysfunction with diuretic at current doses. I suspect that she probably just needs to use the metolazone more frequently. -- However with her recent illnesses, she has not really been eating and drinking as much and therefore is not requiring as much diuretic.

## 2017-05-16 ENCOUNTER — Telehealth: Payer: Self-pay | Admitting: *Deleted

## 2017-05-16 NOTE — Telephone Encounter (Signed)
Spoke to patient. Result given . Verbalized understanding Patient would like to set up appointment 2- 3 months with [facility] to discuss Will send to scheduler.  result forward to primary

## 2017-05-16 NOTE — Telephone Encounter (Signed)
-----   Message from Leonie Man, MD sent at 05/14/2017  4:32 PM EDT ----- Well - overall cholesterol levels are stable, but  We definitely need to get this under control. Will have one of my pharmacists call her to set up [facility] appt.  Glenetta Hew, MD  Pls forward to PCP: Kristopher Glee., MD

## 2017-05-18 ENCOUNTER — Other Ambulatory Visit: Payer: Self-pay | Admitting: Internal Medicine

## 2017-05-27 ENCOUNTER — Encounter: Payer: Self-pay | Admitting: Gastroenterology

## 2017-05-27 ENCOUNTER — Other Ambulatory Visit (INDEPENDENT_AMBULATORY_CARE_PROVIDER_SITE_OTHER): Payer: Medicare Other

## 2017-05-27 ENCOUNTER — Emergency Department (HOSPITAL_COMMUNITY)
Admission: EM | Admit: 2017-05-27 | Discharge: 2017-05-27 | Disposition: A | Discharge status: Home / Self Care | Payer: Medicare Other | Attending: Emergency Medicine | Admitting: Emergency Medicine

## 2017-05-27 ENCOUNTER — Encounter (HOSPITAL_COMMUNITY): Payer: Self-pay | Admitting: Emergency Medicine

## 2017-05-27 ENCOUNTER — Ambulatory Visit (INDEPENDENT_AMBULATORY_CARE_PROVIDER_SITE_OTHER): Payer: Medicare Other | Admitting: Gastroenterology

## 2017-05-27 ENCOUNTER — Telehealth: Payer: Self-pay

## 2017-05-27 ENCOUNTER — Emergency Department (HOSPITAL_COMMUNITY): Payer: Medicare Other

## 2017-05-27 VITALS — BP 104/48 | HR 88 | Ht 65.0 in | Wt 197.1 lb

## 2017-05-27 DIAGNOSIS — R112 Nausea with vomiting, unspecified: Secondary | ICD-10-CM | POA: Diagnosis not present

## 2017-05-27 DIAGNOSIS — I5030 Unspecified diastolic (congestive) heart failure: Secondary | ICD-10-CM | POA: Insufficient documentation

## 2017-05-27 DIAGNOSIS — R630 Anorexia: Secondary | ICD-10-CM | POA: Diagnosis not present

## 2017-05-27 DIAGNOSIS — Z8719 Personal history of other diseases of the digestive system: Secondary | ICD-10-CM | POA: Diagnosis not present

## 2017-05-27 DIAGNOSIS — N200 Calculus of kidney: Secondary | ICD-10-CM | POA: Diagnosis not present

## 2017-05-27 DIAGNOSIS — Z7902 Long term (current) use of antithrombotics/antiplatelets: Secondary | ICD-10-CM | POA: Diagnosis not present

## 2017-05-27 DIAGNOSIS — R9431 Abnormal electrocardiogram [ECG] [EKG]: Secondary | ICD-10-CM | POA: Diagnosis not present

## 2017-05-27 DIAGNOSIS — Z955 Presence of coronary angioplasty implant and graft: Secondary | ICD-10-CM | POA: Diagnosis not present

## 2017-05-27 DIAGNOSIS — E039 Hypothyroidism, unspecified: Secondary | ICD-10-CM | POA: Insufficient documentation

## 2017-05-27 DIAGNOSIS — I2510 Atherosclerotic heart disease of native coronary artery without angina pectoris: Secondary | ICD-10-CM | POA: Insufficient documentation

## 2017-05-27 DIAGNOSIS — I701 Atherosclerosis of renal artery: Secondary | ICD-10-CM | POA: Diagnosis not present

## 2017-05-27 DIAGNOSIS — R110 Nausea: Secondary | ICD-10-CM

## 2017-05-27 DIAGNOSIS — R1011 Right upper quadrant pain: Secondary | ICD-10-CM

## 2017-05-27 DIAGNOSIS — I110 Hypertensive heart disease with heart failure: Secondary | ICD-10-CM | POA: Insufficient documentation

## 2017-05-27 DIAGNOSIS — R1013 Epigastric pain: Secondary | ICD-10-CM

## 2017-05-27 DIAGNOSIS — R197 Diarrhea, unspecified: Secondary | ICD-10-CM | POA: Diagnosis not present

## 2017-05-27 DIAGNOSIS — Z79899 Other long term (current) drug therapy: Secondary | ICD-10-CM | POA: Insufficient documentation

## 2017-05-27 DIAGNOSIS — E119 Type 2 diabetes mellitus without complications: Secondary | ICD-10-CM | POA: Insufficient documentation

## 2017-05-27 DIAGNOSIS — Z96652 Presence of left artificial knee joint: Secondary | ICD-10-CM | POA: Diagnosis not present

## 2017-05-27 LAB — I-STAT TROPONIN, ED: TROPONIN I, POC: 0 ng/mL (ref 0.00–0.08)

## 2017-05-27 LAB — CBC WITH DIFFERENTIAL/PLATELET
BASOS PCT: 0 %
Basophils Absolute: 0.1 10*3/uL (ref 0.0–0.1)
Basophils Absolute: 0 10*3/uL (ref 0.0–0.1)
Basophils Relative: 0.8 % (ref 0.0–3.0)
EOS PCT: 0.9 % (ref 0.0–5.0)
Eosinophils Absolute: 0.1 10*3/uL (ref 0.0–0.7)
Eosinophils Absolute: 0.1 10*3/uL (ref 0.0–0.7)
Eosinophils Relative: 1 %
HEMATOCRIT: 39 % (ref 36.0–46.0)
HEMATOCRIT: 36.5 % (ref 36.0–46.0)
HEMOGLOBIN: 12.7 g/dL (ref 12.0–15.0)
Hemoglobin: 13.4 g/dL (ref 12.0–15.0)
LYMPHS ABS: 1.8 10*3/uL (ref 0.7–4.0)
LYMPHS ABS: 1.7 10*3/uL (ref 0.7–4.0)
LYMPHS PCT: 21.8 % (ref 12.0–46.0)
LYMPHS PCT: 23 %
MCH: 29.7 pg (ref 26.0–34.0)
MCHC: 34.4 g/dL (ref 30.0–36.0)
MCHC: 34.8 g/dL (ref 30.0–36.0)
MCV: 88 fl (ref 78.0–100.0)
MCV: 85.5 fL (ref 78.0–100.0)
MONOS PCT: 12.6 % — AB (ref 3.0–12.0)
MONOS PCT: 13 %
Monocytes Absolute: 1.1 10*3/uL — ABNORMAL HIGH (ref 0.1–1.0)
Monocytes Absolute: 1 10*3/uL (ref 0.1–1.0)
NEUTROS PCT: 63.9 % (ref 43.0–77.0)
Neutro Abs: 5.4 10*3/uL (ref 1.4–7.7)
Neutro Abs: 4.5 10*3/uL (ref 1.7–7.7)
Neutrophils Relative %: 63 %
Platelets: 267 10*3/uL (ref 150.0–400.0)
Platelets: 259 10*3/uL (ref 150–400)
RBC: 4.43 Mil/uL (ref 3.87–5.11)
RBC: 4.27 MIL/uL (ref 3.87–5.11)
RDW: 14.5 % (ref 11.5–15.5)
RDW: 14.8 % (ref 11.5–15.5)
WBC: 8.4 10*3/uL (ref 4.0–10.5)
WBC: 7.3 10*3/uL (ref 4.0–10.5)

## 2017-05-27 LAB — HEPATIC FUNCTION PANEL
ALBUMIN: 3.7 g/dL (ref 3.5–5.2)
ALK PHOS: 55 U/L (ref 39–117)
ALT: 13 U/L (ref 0–35)
AST: 14 U/L (ref 0–37)
Bilirubin, Direct: 0.1 mg/dL (ref 0.0–0.3)
TOTAL PROTEIN: 6.8 g/dL (ref 6.0–8.3)
Total Bilirubin: 0.9 mg/dL (ref 0.2–1.2)

## 2017-05-27 LAB — COMPREHENSIVE METABOLIC PANEL
ALBUMIN: 3.6 g/dL (ref 3.5–5.0)
ALK PHOS: 53 U/L (ref 38–126)
ALT: 15 U/L (ref 14–54)
ANION GAP: 14 (ref 5–15)
AST: 27 U/L (ref 15–41)
BILIRUBIN TOTAL: 1.1 mg/dL (ref 0.3–1.2)
BUN: 23 mg/dL — ABNORMAL HIGH (ref 6–20)
CALCIUM: 8.7 mg/dL — AB (ref 8.9–10.3)
CO2: 27 mmol/L (ref 22–32)
Chloride: 94 mmol/L — ABNORMAL LOW (ref 101–111)
Creatinine, Ser: 1.21 mg/dL — ABNORMAL HIGH (ref 0.44–1.00)
GFR calc Af Amer: 49 mL/min — ABNORMAL LOW (ref >60)
GFR, EST NON AFRICAN AMERICAN: 43 mL/min — AB (ref >60)
GLUCOSE: 187 mg/dL — AB (ref 65–99)
Potassium: 2.9 mmol/L — ABNORMAL LOW (ref 3.5–5.1)
Sodium: 135 mmol/L (ref 135–145)
TOTAL PROTEIN: 6.6 g/dL (ref 6.5–8.1)

## 2017-05-27 LAB — BASIC METABOLIC PANEL
BUN: 21 mg/dL (ref 6–23)
CALCIUM: 9.2 mg/dL (ref 8.4–10.5)
CO2: 31 mEq/L (ref 19–32)
CREATININE: 0.93 mg/dL (ref 0.40–1.20)
Chloride: 95 mEq/L — ABNORMAL LOW (ref 96–112)
GFR: 62.44 mL/min (ref >60.00)
GLUCOSE: 125 mg/dL — AB (ref 70–99)
Potassium: 2.5 mEq/L — CL (ref 3.5–5.1)
Sodium: 136 mEq/L (ref 135–145)

## 2017-05-27 LAB — MAGNESIUM: Magnesium: 1.8 mg/dL (ref 1.7–2.4)

## 2017-05-27 LAB — URINALYSIS, ROUTINE W REFLEX MICROSCOPIC
Bilirubin Urine: NEGATIVE
GLUCOSE, UA: NEGATIVE mg/dL
Hgb urine dipstick: NEGATIVE
KETONES UR: NEGATIVE mg/dL
LEUKOCYTES UA: NEGATIVE
Nitrite: NEGATIVE
PH: 7 (ref 5.0–8.0)
Protein, ur: NEGATIVE mg/dL
Specific Gravity, Urine: 1.017 (ref 1.005–1.030)

## 2017-05-27 LAB — LIPASE: Lipase: 32 U/L (ref 11.0–59.0)

## 2017-05-27 LAB — AMYLASE: Amylase: 20 U/L — ABNORMAL LOW (ref 27–131)

## 2017-05-27 LAB — LIPASE, BLOOD: LIPASE: 33 U/L (ref 11–51)

## 2017-05-27 MED ORDER — ONDANSETRON 4 MG PO TBDP
4.0000 mg | ORAL_TABLET | ORAL | Status: DC | PRN
  Administered 2017-05-27: 8 mg via ORAL
  Filled 2017-05-27 (×2): qty 1

## 2017-05-27 MED ORDER — POTASSIUM CHLORIDE CRYS ER 20 MEQ PO TBCR
40.0000 meq | EXTENDED_RELEASE_TABLET | Freq: Once | ORAL | Status: AC
  Administered 2017-05-27: 40 meq via ORAL
  Filled 2017-05-27: qty 2

## 2017-05-27 MED ORDER — ONDANSETRON 4 MG PO TBDP: 4.0000 mg | ORAL_TABLET | Freq: Once | ORAL | Status: DC

## 2017-05-27 MED ORDER — ONDANSETRON 8 MG PO TBDP
8.0000 mg | ORAL_TABLET | Freq: Once | ORAL | Status: DC
  Filled 2017-05-27: qty 1

## 2017-05-27 MED ORDER — ONDANSETRON 4 MG PO TBDP: ORAL_TABLET | ORAL | 1 refills | Status: DC

## 2017-05-27 MED ORDER — IOPAMIDOL (ISOVUE-300) INJECTION 61%
INTRAVENOUS | Status: AC
  Filled 2017-05-27: qty 100

## 2017-05-27 MED ORDER — SODIUM CHLORIDE 0.9 % IV BOLUS (SEPSIS)
1000.0000 mL | Freq: Once | INTRAVENOUS | Status: AC
  Administered 2017-05-27: 1000 mL via INTRAVENOUS

## 2017-05-27 MED ORDER — POTASSIUM CHLORIDE 10 MEQ/100ML IV SOLN
10.0000 meq | INTRAVENOUS | Status: DC
  Administered 2017-05-27: 10 meq via INTRAVENOUS
  Filled 2017-05-27 (×2): qty 100

## 2017-05-27 MED ORDER — IOPAMIDOL (ISOVUE-300) INJECTION 61%
100.0000 mL | Freq: Once | INTRAVENOUS | Status: AC | PRN
  Administered 2017-05-27: 100 mL via INTRAVENOUS

## 2017-05-27 NOTE — Telephone Encounter (Signed)
Spoke with pt and let her know that per Tricia Bogus PA she needs to go to the ER to receive potassium supplementation. K level 2.5. Pt states she will call her husband to come home and take her, she does not feel that she can drive herself.

## 2017-05-27 NOTE — ED Provider Notes (Signed)
[facility] DEPT Provider Note   CSN: [id number] Arrival date & time: 05/27/17  1721     History   Chief Complaint Chief Complaint  Patient presents with  . sent by PCP for lower potassium    HPI [name] is a 76 y.o. female hx of afib, CAD here presenting with epigastric pain, hypokalemia. Patient has chronic epigastric pain for the last several months. Patient states that over the last month or so, her appetite has decreased and she has been vomiting after she eats. Also has episodes of diarrhea after she eats. Denies fevers or chills or travel. Went to GI office today and had labs drawn and potassium was 2.5, so she was sent for evaluation. She also has CT ab/pel scheduled for next week.   The history is provided by the patient.    Past Medical History:  Diagnosis Date  . Arthritis    "middle finger right hand" (01/14/2017)  . Atrial fibrillation ([place])   . Back pain with radiation    Chronic Back Pain - mutliple surgeries (including tumor removal)  . Bilateral edema of lower extremity    Chronic, likely related to venous stasis  . CAD S/P percutaneous coronary angioplasty    a) LHC: 07/30/10. -- 3.0x22mm Integrity BMS to pRCA & 2.5x 85mm Integrity BMS mLAD(@ D2).  b) Class III-IV Angina 03/2011: LHC- ISR in LAD BMS -- prox overlapping Promus DES 2.5x71mm and PTCA of jailed D2 ostium-prox 80%. c) 02/03/12:  LHC- patent stents.  Jailed diagonal. with stable flow; d) Peri-OP NSTEMI 04/2012 - LHC in 12/'13 -   . Celiac artery stenosis ([place])    12/2016 - STENT placement  . Complication of anesthesia    "used to wake up wild" (01/14/2017)  . Congestive heart failure with LV diastolic dysfunction, NYHA class 2 ([place])    06/13/10:  last 2D echo-  EF >55%, Mild TR, Mod Conc LVH - Grade 1 diastolic dysfunction (abnormal relaxation) --> LVEDP on Cath 28 mmHg & mild 2nd Pulm HTN  . Diverticulitis of colon (without mention of hemorrhage)(562.11)   . Dyslipidemia, goal LDL below 70    Intolerant to statins  . GERD (gastroesophageal reflux disease)   . Hepatitis ~ 1957   "yellow jaundice" (01/14/2017)  . Hepatomegaly   . Hiatal hernia   . History of blood transfusion    "when I had a heart attack"  . History of kidney stones   . History of stomach ulcers "years ago"  . Irritable bowel syndrome (IBS)   . Labile essential hypertension    Partially related to RAS  . Mesenteric artery stenosis (H[facility])    95% Celiac Artery - ostial, 20-30% SMA.  Bilateral Renal A: L RA stent patent, R RA 20-30% -- Conservative Management  . NSTEMI (non-ST elevated myocardial infarction) ([place]) 04/2012   Unclear the details, apparently this was postoperative from her back surgery that she was cleared for my last saw her in June.  Reportedly had stents placed  . Pancreas divisum    On pancreatic enzyme  . Renal artery stenosis ([place]) 2011; 12/'13   a) Angiogram 02/03/12:  50-60%L RA stenosis, 40% R R Inferior artery; b) 12/'13: S/P L RA Stent ([facility]) 6.0 mm x 15 mm; c) Renal Duplex 10/2013: <60% L RA, <60 R RA, ~60% SMA & Celiac A.  . Stricture and stenosis of esophagus   . Type II diabetes mellitus ([place])    "borderline; not on RX" (01/14/2017)  .  Unspecified disorder of thyroid   . Unspecified gastritis and gastroduodenitis without mention of hemorrhage     Patient Active Problem List   Diagnosis Date Noted  . Nausea and vomiting 05/27/2017  . Chest pain with moderate risk for cardiac etiology 01/21/2017  . Costochondritis, acute 01/21/2017  . PAD (peripheral artery disease) ([place]) 01/14/2017  . Continuous severe abdominal pain 10/11/2016  . RUQ abdominal pain 10/11/2016  . Type 2 diabetes mellitus with peripheral neuropathy ([place]) 10/09/2016  . Hammer toe of right foot 11/16/2015  . Onychomycosis due to dermatophyte 11/16/2015  . Exertional dyspnea 03/16/2015  . Abnormal electrocardiogram during exercise stress test 03/15/2015  . Pre-operative cardiovascular examination, new EKG  abnormalities c/w ischemia 03/15/2015  . CAD (coronary artery disease) 03/07/2015  . Obesity (BMI 30.0-34.9) 06/10/2014  . Mesenteric artery stenosis ([place]) 04/20/2014  . Idiopathic chronic pancreatitis suspected 03/10/2014  . Abnormal CT of liver-possible cirrhosis 03/10/2014  . Dysphagia 03/10/2014  . Long term current use of antithrombotics/antiplatelets - clopidogrel 03/10/2014  . Bruit of right carotid artery 02/27/2014  . Abdominal pain, epigastric 01/19/2014  . History of pancreatitis 01/19/2014  . Leg cramps 01/19/2014  . Palpitation 01/12/2014  . Stasis edema of bilateral lower extremity   . Congestive heart failure with LV diastolic dysfunction, NYHA class 2 ([place])   . Essential hypertension   . Dyslipidemia, goal LDL below 70 - WITH STATIN INTOLERANCE   . Celiac artery stenosis: 60% by Duplex - 90-95% by cath - Med Rx 11/27/2013  . Renal artery stenosis ([place]) 08/29/2012  . MI (myocardial infarction) ([place]) 04/02/2012  . CAD S/P percutaneous coronary angioplasty: pRCA BMS, mLAD BMS overlapped prox with DES for ISR 07/30/2010  . GERD (gastroesophageal reflux disease) 01/24/2009  . Diarrhea due to malabsorption 01/24/2009  . Hypothyroid 11/12/2007  . CARDIAC ARRHYTHMIA 11/12/2007  . Irritable bowel syndrome 11/12/2007  . RENAL CALCULUS 11/12/2007  . PANCREAS DIVISUM 11/12/2007  . HIATAL HERNIA 11/20/2005    Past Surgical History:  Procedure Laterality Date  . APPENDECTOMY    . BACK SURGERY    . BREAST DUCTAL SYSTEM EXCISION     right  . BREAST SURGERY Right   . CARDIAC CATHETERIZATION N/A 03/16/2015   Procedure: Left Heart Cath and Coronary Angiography;  Surgeon: Leonie Man, MD;  Location: [facility];  Service: Cardiovascular; Widely patent m-dLAD stents as wellas pRCA stent.  High LVEDP, small Diag & Om vessels with moderate stenosis   . Cardiac Event Monitor  01/2017   Mostly NSR with occasional sinus tachycardia and rare bradycardia. No A. fib. No PND or  PSVT. Rare PACs and PVCs.  Marland Kitchen CARDIOVASCULAR STRESS TEST     11/07/11:  Normal perfusion pattern.  EF 66% No wall motion abnormalities.   Marland Kitchen CATARACT EXTRACTION W/ INTRAOCULAR LENS  IMPLANT, BILATERAL Bilateral   . CHOLECYSTECTOMY OPEN    . COLONOSCOPY  03/01/2009   normal   . CORONARY ANGIOPLASTY WITH STENT PLACEMENT  07/2010, 02/2011   LHC: 07/30/10.  3.0x44mm Integrity BMS to RCA and 2.5x 14mm Integrity BMS LAD.  03/2011: LHC- ISRS in LAD treated with overlapping Promus DES 2.5x71mm and PTCA of Diagonal.  02/03/12:  LHC- patent stents.  Jailed diagonal.  . DOBUTAMINE STRESS ECHO  03/07/2015   [facility] (ordered for pre-op evaluation for EUS/ERCP --> abnormal EKG:  strreesss test shhoowwed 1 mm ST segment depressions downsloping. No wall motion abnormalities at peak exercise or at rest. Diastolic dysfunction was noted but normal systolic function -  EF greater than 55%. No bouts of regurgitation or stenosis. Resting hypertension with exaggerated response  . ESOPHAGOGASTRODUODENOSCOPY  04/08/2012  . FOOT SURGERY Bilateral    "took bone out of 2nd toe on each foot"  . HIP SURGERY     left  . JOINT REPLACEMENT    . KNEE SURGERY Left    "had fluid drained off it a couple times"  . LEFT HEART CATHETERIZATION WITH CORONARY ANGIOGRAM N/A 02/03/2012   Procedure: LEFT HEART CATHETERIZATION WITH CORONARY ANGIOGRAM;  Surgeon: Leonie Man, MD;  Location[facility] CATH LAB;  Service: Cardiovascular;  Laterality: N/A;  . LEFT HEART CATHETERIZATION WITH CORONARY ANGIOGRAM N/A 05/12/2014   Procedure: LEFT HEART CATHETERIZATION WITH CORONARY ANGIOGRAM;  Surgeon: Lorretta Harp, MD;  Location: [facility] CATH LAB;  Service: Cardiovascular: Stable CAD. Patent stents. Patent renal artery stent  . LEFT HEART CATHETERIZATION WITH CORONARY ANGIOGRAM  08/2012   Peri-Op MI @ [facility] -- 40% ostial D1, patent LAD stents, 10% RCA ISR  . LEFT HEART CATHETERIZATION WITH CORONARY ANGIOGRAM   03/2012   ~30% ISR RCA, patent LAD stent & D2;  EDP ~28 mmHg  . NM MYOVIEW LTD  12/2016   LOW RISK study. No ischemia or infarction. EF 65-75%.  Marland Kitchen PANCREAS SURGERY     "stint in my pancreas"; Dr Pershing Proud  . PERCUTANEOUS PINNING PHALANX FRACTURE OF HAND Left ~ 2013  . PERIPHERAL VASCULAR INTERVENTION  01/14/2017   Procedure: Peripheral Vascular Intervention;  Surgeon: Serafina Mitchell, MD;  Location: [facility];  Service: Cardiovascular;;  mesentric  . RENAL ARTERY STENT Left 08/2012   @ [facility] - 6.0 mm x 15 mm  . SPINE SURGERY     tumor removed 07/2010; Redo Surgery 04/2012; Sacroiliac Sgx 08/2013  . TOTAL ABDOMINAL HYSTERECTOMY    . TOTAL KNEE ARTHROPLASTY Left ~ 2012  . VISCERAL ANGIOGRAM N/A 05/12/2014   Procedure: VISCERAL ANGIOGRAM;  Surgeon: Lorretta Harp, MD;  Location: [facility] CATH LAB;  Service: Cardiovascular;  Laterality: N/A;  . VISCERAL ANGIOGRAPHY N/A 01/14/2017   Procedure: Mesenteric  Angiography;  Surgeon: Serafina Mitchell, MD;  Location: [facility];  Service: Cardiovascular;  Laterality: N/A;    OB History    No data available       Home Medications    Prior to Admission medications   Medication Sig Start Date End Date Taking? Authorizing Provider  acetaminophen (TYLENOL) 500 MG tablet Take 2,000 mg by mouth every 6 (six) hours as needed for moderate pain.   Yes [provider]  carvedilol (COREG) 6.25 MG tablet Take 6.25 mg by mouth 2 (two) times daily with a meal.   Yes [provider]  clopidogrel (PLAVIX) 75 MG tablet TAKE 1 TABLET(75 MG) BY MOUTH DAILY 04/18/17  Yes Leonie Man, MD  furosemide (LASIX) 80 MG tablet Take 1 tablet (80 mg total) by mouth 2 (two) times daily. 01/07/17 05/27/17 Yes Leonie Man, MD  isosorbide mononitrate (IMDUR) 60 MG 24 hr tablet Take 60 mg by mouth daily.   Yes [provider]  lidocaine (XYLOCAINE) 5 % ointment Apply 1 application topically at bedtime.  02/04/14  Yes [provider]  losartan (COZAAR) 50 MG  tablet TAKE 1 TABLET(50 MG) BY MOUTH DAILY 04/29/17  Yes Leonie Man, MD  metolazone (ZAROXOLYN) 2.5 MG tablet Take 1 to 2 tablets  by mouth 30-60 minutes prior to morning dose of lasix AS NEEDED  Patient taking differently: Take 2.5 mg by mouth daily.  01/21/17  Yes Leonie Man, MD  acetaminophen (TYLENOL) 325 MG tablet Take 2 tablets (650 mg total) by mouth 3 (three) times daily as needed for mild pain or moderate pain. Patient not taking: Reported on 05/27/2017 01/15/17   Virgina Jock A, PA-C  amitriptyline (ELAVIL) 25 MG tablet TAKE 1 TABLET(25 MG) BY MOUTH AT BEDTIME 03/19/17   Pyrtle, Lajuan Lines, MD  gabapentin (NEURONTIN) 600 MG tablet Take 600 mg by mouth 3 (three) times daily.     [provider]  nitroGLYCERIN (NITROSTAT) 0.4 MG SL tablet DISSOLVE 1 TABLET UNDER THE TONGUE EVERY 5 MINUTES AS NEEDED FOR CHEST PAIN 11/20/16   Leonie Man, MD  ondansetron (ZOFRAN ODT) 4 MG disintegrating tablet Dissolve 1 tablet on the tongue every 6 hours. 05/27/17   Zehr, Laban Emperor, PA-C  pantoprazole (PROTONIX) 40 MG tablet TAKE 1 TABLET(40 MG) BY MOUTH DAILY 05/19/17   Pyrtle, Lajuan Lines, MD    Family History Family History  Problem Relation Age of Onset  . Cancer Father        mets  . Heart attack Father   . Heart disease Father   . Stroke Mother   . Heart attack Brother   . Heart attack Brother   . Colitis Maternal Grandfather   . Diabetes Brother   . Stroke Brother   . Heart attack Brother   . Colon cancer Neg Hx   . Stomach cancer Neg Hx     Social History Social History  Substance Use Topics  . Smoking status: Never Smoker  . Smokeless tobacco: Never Used  . Alcohol use No     Allergies   Codeine phosphate; Iodine; Prednisone; Statins; and Tramadol   Review of Systems Review of Systems  Gastrointestinal: Positive for abdominal pain, diarrhea and vomiting.  All other systems reviewed and are negative.    Physical Exam Updated Vital Signs BP (!) 131/48 (BP  Location: Right Arm)   Pulse 98   Temp 98.2 F (36.8 C) (Oral)   Resp 16   Ht 5\' 8"  (1.727 m)   Wt 89.4 kg (197 lb)   SpO2 97%   BMI 29.95 kg/m   Physical Exam  Constitutional: She is oriented to person, place, and time.  Chronically ill, slightly dehydrated   HENT:  Head: Normocephalic.  MM slightly dry   Eyes: Pupils are equal, round, and reactive to light. Conjunctivae and EOM are normal.  Neck: Normal range of motion. Neck supple.  Cardiovascular: Normal rate, regular rhythm and normal heart sounds.   Pulmonary/Chest: Effort normal and breath sounds normal. No respiratory distress. She has no wheezes.  Abdominal: Soft. Bowel sounds are normal. She exhibits no distension.  Mild epigastric tenderness, no rebound   Musculoskeletal: Normal range of motion.  Neurological: She is alert and oriented to person, place, and time. No cranial nerve deficit. Coordination normal.  Skin: Skin is warm.  Psychiatric: She has a normal mood and affect.  Nursing note and vitals reviewed.    ED Treatments / Results  Labs (all labs ordered are listed, but only abnormal results are displayed) Labs Reviewed  COMPREHENSIVE METABOLIC PANEL - Abnormal; Notable for the following:       Result Value   Potassium 2.9 (*)    Chloride 94 (*)    Glucose, Bld 187 (*)    BUN 23 (*)    Creatinine, Ser 1.21 (*)    Calcium 8.7 (*)  GFR calc non Af Amer 43 (*)    GFR calc Af Amer 49 (*)    All other components within normal limits  CBC WITH DIFFERENTIAL/PLATELET  LIPASE, BLOOD  MAGNESIUM  URINALYSIS, ROUTINE W REFLEX MICROSCOPIC  I-STAT TROPONIN, ED    EKG  EKG Interpretation  Date/Time:  Tuesday May 27 2017 19:13:20 EDT Ventricular Rate:  73 PR Interval:    QRS Duration: 94 QT Interval:  407 QTC Calculation: 449 R Axis:   58 Text Interpretation:  Sinus rhythm Abnormal R-wave progression, early transition Repol abnrm suggests ischemia, anterolateral No significant change since last  tracing Confirmed by Wandra Arthurs [phone]) on 05/27/2017 8:22:29 PM       Radiology Ct Abdomen Pelvis W Contrast  Result Date: 05/27/2017 CLINICAL DATA:  Nausea and diarrhea for several weeks. EXAM: CT ABDOMEN AND PELVIS WITH CONTRAST TECHNIQUE: Multidetector CT imaging of the abdomen and pelvis was performed using the standard protocol following bolus administration of intravenous contrast. CONTRAST:  138mL ISOVUE-300 IOPAMIDOL (ISOVUE-300) INJECTION 61% COMPARISON:  November 08, 2014 FINDINGS: Lower chest: No acute abnormality. Hepatobiliary: The liver is normal. No focal liver lesion is identified. Patient status post prior cholecystectomy. Intra and extrahepatic pneumobilia is identified, postsurgical. Pancreas: Unremarkable. No pancreatic ductal dilatation or surrounding inflammatory changes. Spleen: Normal in size without focal abnormality. Adrenals/Urinary Tract: Low-density left adrenal mass is identified unchanged compared prior exam consistent with adrenal adenoma. The right adrenal gland is normal. There are nonobstructing stones in the right kidney. There is a 1 cm cyst in upper pole left kidney. There is no hydronephrosis bilaterally. The bladder is normal. Stomach/Bowel: There is no small bowel obstruction or diverticulitis. The patient status post prior appendectomy. There is a small hiatal hernia. The stomach is otherwise normal. Vascular/Lymphatic: Aortic atherosclerosis. No enlarged abdominal or pelvic lymph nodes. Reproductive: Status post hysterectomy. No adnexal masses. Other: None. Musculoskeletal:  Spinal rods are noted. IMPRESSION: No acute abnormality identified in the abdomen and pelvis. No bowel obstruction or diverticulitis. Prior cholecystectomy with postsurgical intra and extrahepatic pneumobilia. Nonobstructing stone in right kidney. No hydronephrosis noted bilaterally. Electronically Signed   By: Abelardo Diesel M.D.   On: 05/27/2017 18:57    Procedures Procedures (including  critical care time)  Medications Ordered in ED Medications  potassium chloride 10 mEq in 100 mL IVPB (10 mEq Intravenous New Bag/Given 05/27/17 1822)  iopamidol (ISOVUE-300) 61 % injection (not administered)  ondansetron (ZOFRAN-ODT) disintegrating tablet 8 mg (not administered)  sodium chloride 0.9 % bolus 1,000 mL (1,000 mLs Intravenous New Bag/Given 05/27/17 1821)  potassium chloride SA (K-DUR,KLOR-CON) CR tablet 40 mEq (40 mEq Oral Given 05/27/17 1820)  iopamidol (ISOVUE-300) 61 % injection 100 mL (100 mLs Intravenous Contrast Given 05/27/17 1839)     Initial Impression / Assessment and Plan / ED Course  I have reviewed the triage vital signs and the nursing notes.  Pertinent labs & imaging results that were available during my care of the patient were reviewed by me and considered in my medical decision making (see chart for details).     [name] is a 76 y.o. female here with abdominal pain, vomiting, diarrhea for about a month. She had previous GI workup including MRCP and had previous cholecystectomy. She states that they never found a cause for her symptoms. Will repeat CMP, get magnesium level, replace potassium, CT ab/pel.    8:22 PM K 2.9, improved from 2.5 earlier today. Given potassium IV and PO. Magnesium level normal. CT ab/pel  unremarkable. I encouraged her to call GI doctor regarding possible MRCP or ERCP. Has zofran prescribed by GI doctor already and I encourage her to take it to help with her nausea.    Final Clinical Impressions(s) / ED Diagnoses   Final diagnoses:  None    New Prescriptions New Prescriptions   No medications on file     Drenda Freeze, MD 05/27/17 2023

## 2017-05-27 NOTE — ED Triage Notes (Signed)
Patient been having upset stomach and nausea/diarrhea for several weeks. Was at PCP today and had blood work done and was called to go to ED due to low potassium. Patient reports that she has been having irregular heart beats.

## 2017-05-27 NOTE — Discharge Instructions (Signed)
Stay hydrated.   Take zofran as prescribed by your doctor.   Call GI doctor regarding your CT and lab results. Consider MRCP or ERCP   Return to ER if you have worse vomiting, fever, severe abdominal pain, uncontrolled diarrhea.

## 2017-05-27 NOTE — Progress Notes (Addendum)
05/27/2017 [name] [id number] [birth year]   HISTORY OF PRESENT ILLNESS:  76 year old female with a past medical history of chronic epigastric abdominal pain, pancreas divisum with history of ERCP with biliary sphincterotomy, CAD and peripheral vascular disease, celiac artery stenosis now status post stent placement in May 2018, chronic loose stools with abdominal bloating who is here for follow-up. She was last seen on 01/2017. She is here alone today.  Per Dr. Vena Rua note in 01/2017:   "Dr. Trula Slade performed mesenteric angiography in May 2018 and celiac artery stent placement. The stent placement completely resolve the celiac stenosis. The SMA was noted to be approximately 30% stenosed. She recovered from this procedure but reports it has not helped with her epigastric abdominal pain. She is still having episodes of epigastric and right upper quadrant pain radiating across the upper abdomen. This is associated with nausea but less frequently vomiting. She is still reporting days of loose watery stools as many as 3-6 times per day. Stools are nonbloody and non-melenic. Imodium was too effective and Lomotil made her symptoms worse. She's tried Zenpep previously prescribed by Dr. Sharlett Iles and most recently by Dr. Harl Bowie but this did not help or improve the loose stools. She also had fecal elastase which was normal.   She reports that several doctors question costochondritis but she has been intolerant of prednisone. She took 4 days of ibuprofen therapy recommended by Dr. Ellyn Hack but this didn't seem to help her pain."   MRI/MRCP performed this year showed stable biliary ductal dilatation postcholecystectomy but no CBD stones. Pancreas divisum without abnormality in the pancreas parenchyma. Liver enzymes and lipase have been normal.   Colonoscopy with history of diverticulosis in 2010. Biopsies negative for microscopic colitis.  EGD 2015 with Dr. Carlean Purl with mild gastritis but no H.  Pylori   She comes in today with continued complaints of upper abdominal pain--right upper quadrant, epigastric, and even some until her left upper quadrant. She continues to report pain over her rib cage/chest wall. Complains of daily nausea that is constant and very frequent vomiting of yellow liquid. She has been able to keep only very little down in the way of food and even liquids. Still with loose stools. This is all been much more severe over the past 4 weeks. She is clearly frustrated with her symptoms. Asking about a HIDA scan as she was told by 3 people that they think she needs to have that study performed.  Was treated with Xifaxan, but obviously did not have any type of dramatic response to that. Was given Elavil 25 mg to take at bedtime previously, but she has not been taking that as she thought it was only as needed.    Past Medical History:  Diagnosis Date  . Arthritis    "middle finger right hand" (01/14/2017)  . Atrial fibrillation ([place])   . Back pain with radiation    Chronic Back Pain - mutliple surgeries (including tumor removal)  . Bilateral edema of lower extremity    Chronic, likely related to venous stasis  . CAD S/P percutaneous coronary angioplasty    a) LHC: 07/30/10. -- 3.0x76mm Integrity BMS to pRCA & 2.5x 86mm Integrity BMS mLAD(@ D2).  b) Class III-IV Angina 03/2011: LHC- ISR in LAD BMS -- prox overlapping Promus DES 2.5x22mm and PTCA of jailed D2 ostium-prox 80%. c) 02/03/12:  LHC- patent stents.  Jailed diagonal. with stable flow; d) Peri-OP NSTEMI 04/2012 - LHC in 12/'13 -   .  Celiac artery stenosis ([place])    12/2016 - STENT placement  . Complication of anesthesia    "used to wake up wild" (01/14/2017)  . Congestive heart failure with LV diastolic dysfunction, NYHA class 2 ([place])    06/13/10:  last 2D echo-  EF >55%, Mild TR, Mod Conc LVH - Grade 1 diastolic dysfunction (abnormal relaxation) --> LVEDP on Cath 28 mmHg & mild 2nd Pulm HTN  . Diverticulitis of colon  (without mention of hemorrhage)(562.11)   . Dyslipidemia, goal LDL below 70    Intolerant to statins  . GERD (gastroesophageal reflux disease)   . Hepatitis ~ 1957   "yellow jaundice" (01/14/2017)  . Hepatomegaly   . Hiatal hernia   . History of blood transfusion    "when I had a heart attack"  . History of kidney stones   . History of stomach ulcers "years ago"  . Irritable bowel syndrome (IBS)   . Labile essential hypertension    Partially related to RAS  . Mesenteric artery stenosis ([facility])    95% Celiac Artery - ostial, 20-30% SMA.  Bilateral Renal A: L RA stent patent, R RA 20-30% -- Conservative Management  . NSTEMI (non-ST elevated myocardial infarction) ([place]) 04/2012   Unclear the details, apparently this was postoperative from her back surgery that she was cleared for my last saw her in June.  Reportedly had stents placed  . Pancreas divisum    On pancreatic enzyme  . Renal artery stenosis ([place]) 2011; 12/'13   a) Angiogram 02/03/12:  50-60%L RA stenosis, 40% R R Inferior artery; b) 12/'13: S/P L RA Stent ([facility]) 6.0 mm x 15 mm; c) Renal Duplex 10/2013: <60% L RA, <60 R RA, ~60% SMA & Celiac A.  . Stricture and stenosis of esophagus   . Type II diabetes mellitus ([place])    "borderline; not on RX" (01/14/2017)  . Unspecified disorder of thyroid   . Unspecified gastritis and gastroduodenitis without mention of hemorrhage    Past Surgical History:  Procedure Laterality Date  . APPENDECTOMY    . BACK SURGERY    . BREAST DUCTAL SYSTEM EXCISION     right  . BREAST SURGERY Right   . CARDIAC CATHETERIZATION N/A 03/16/2015   Procedure: Left Heart Cath and Coronary Angiography;  Surgeon: Leonie Man, MD;  Location: [place] [facility];  Service: Cardiovascular; Widely patent m-dLAD stents as wellas pRCA stent.  High LVEDP, small Diag & Om vessels with moderate stenosis   . Cardiac Event Monitor  01/2017   Mostly NSR with occasional sinus tachycardia and rare bradycardia. No A.  fib. No PND or PSVT. Rare PACs and PVCs.  Marland Kitchen CARDIOVASCULAR STRESS TEST     11/07/11:  Normal perfusion pattern.  EF 66% No wall motion abnormalities.   Marland Kitchen CATARACT EXTRACTION W/ INTRAOCULAR LENS  IMPLANT, BILATERAL Bilateral   . CHOLECYSTECTOMY OPEN    . COLONOSCOPY  03/01/2009   normal   . CORONARY ANGIOPLASTY WITH STENT PLACEMENT  07/2010, 02/2011   LHC: 07/30/10.  3.0x24mm Integrity BMS to RCA and 2.5x 48mm Integrity BMS LAD.  03/2011: LHC- ISRS in LAD treated with overlapping Promus DES 2.5x73mm and PTCA of Diagonal.  02/03/12:  LHC- patent stents.  Jailed diagonal.  . DOBUTAMINE STRESS ECHO  03/07/2015   [facility] (ordered for pre-op evaluation for EUS/ERCP --> abnormal EKG:  strreesss test shhoowwed 1 mm ST segment depressions downsloping. No wall motion abnormalities at peak exercise or at rest. Diastolic dysfunction was  noted but normal systolic function - EF greater than 55%. No bouts of regurgitation or stenosis. Resting hypertension with exaggerated response  . ESOPHAGOGASTRODUODENOSCOPY  04/08/2012  . FOOT SURGERY Bilateral    "took bone out of 2nd toe on each foot"  . HIP SURGERY     left  . JOINT REPLACEMENT    . KNEE SURGERY Left    "had fluid drained off it a couple times"  . LEFT HEART CATHETERIZATION WITH CORONARY ANGIOGRAM N/A 02/03/2012   Procedure: LEFT HEART CATHETERIZATION WITH CORONARY ANGIOGRAM;  Surgeon: Leonie Man, MD;  Location: [facility] CATH LAB;  Service: Cardiovascular;  Laterality: N/A;  . LEFT HEART CATHETERIZATION WITH CORONARY ANGIOGRAM N/A 05/12/2014   Procedure: LEFT HEART CATHETERIZATION WITH CORONARY ANGIOGRAM;  Surgeon: Lorretta Harp, MD;  Location: [facility] CATH LAB;  Service: Cardiovascular: Stable CAD. Patent stents. Patent renal artery stent  . LEFT HEART CATHETERIZATION WITH CORONARY ANGIOGRAM  08/2012   Peri-Op MI @ [facility] -- 40% ostial D1, patent LAD stents, 10% RCA ISR  . LEFT HEART CATHETERIZATION WITH CORONARY ANGIOGRAM   03/2012   ~30% ISR RCA, patent  LAD stent & D2; EDP ~28 mmHg  . NM MYOVIEW LTD  12/2016   LOW RISK study. No ischemia or infarction. EF 65-75%.  Marland Kitchen PANCREAS SURGERY     "stint in my pancreas"; Dr Pershing Proud  . PERCUTANEOUS PINNING PHALANX FRACTURE OF HAND Left ~ 2013  . PERIPHERAL VASCULAR INTERVENTION  01/14/2017   Procedure: Peripheral Vascular Intervention;  Surgeon: Serafina Mitchell, MD;  Location: [place] [facility];  Service: Cardiovascular;;  mesentric  . RENAL ARTERY STENT Left 08/2012   @ [facility] - 6.0 mm x 15 mm  . SPINE SURGERY     tumor removed 07/2010; Redo Surgery 04/2012; Sacroiliac Sgx 08/2013  . TOTAL ABDOMINAL HYSTERECTOMY    . TOTAL KNEE ARTHROPLASTY Left ~ 2012  . VISCERAL ANGIOGRAM N/A 05/12/2014   Procedure: VISCERAL ANGIOGRAM;  Surgeon: Lorretta Harp, MD;  Location: [facility] CATH LAB;  Service: Cardiovascular;  Laterality: N/A;  . VISCERAL ANGIOGRAPHY N/A 01/14/2017   Procedure: Mesenteric  Angiography;  Surgeon: Serafina Mitchell, MD;  Location: [place] [facility];  Service: Cardiovascular;  Laterality: N/A;    reports that she has never smoked. She has never used smokeless tobacco. She reports that she does not drink alcohol or use drugs. family history includes Cancer in her father; Colitis in her maternal grandfather; Diabetes in her brother; Heart attack in her brother, brother, brother, and father; Heart disease in her father; Stroke in her brother and mother. Allergies  Allergen Reactions  . Codeine Phosphate Anaphylaxis, Shortness Of Breath and Swelling  . Iodine Swelling and Rash    REACTION: unspecified This allergy is to Topical iodine only.   . Prednisone Itching and Rash    Keeps pt up all night   . Statins Other (See Comments)    Intolerant   . Tramadol Other (See Comments)    Kept patient awake      Outpatient Encounter Prescriptions as of 05/27/2017  Medication Sig  . acetaminophen (TYLENOL) 325 MG tablet Take 2 tablets (650 mg total) by mouth 3 (three) times daily  as needed for mild pain or moderate pain.  Marland Kitchen amitriptyline (ELAVIL) 25 MG tablet TAKE 1 TABLET(25 MG) BY MOUTH AT BEDTIME  . carvedilol (COREG) 6.25 MG tablet Take 6.25 mg by mouth 2 (two) times daily with a meal.  .  clopidogrel (PLAVIX) 75 MG tablet TAKE 1 TABLET(75 MG) BY MOUTH DAILY  . furosemide (LASIX) 80 MG tablet Take 1 tablet (80 mg total) by mouth 2 (two) times daily.  . isosorbide mononitrate (IMDUR) 60 MG 24 hr tablet Take 60 mg by mouth daily.  Marland Kitchen lidocaine (XYLOCAINE) 5 % ointment Apply 1 application topically at bedtime.   Marland Kitchen losartan (COZAAR) 50 MG tablet TAKE 1 TABLET(50 MG) BY MOUTH DAILY  . metolazone (ZAROXOLYN) 2.5 MG tablet Take 1 to 2 tablets  by mouth 30-60 minutes prior to morning dose of lasix AS NEEDED  . nitroGLYCERIN (NITROSTAT) 0.4 MG SL tablet DISSOLVE 1 TABLET UNDER THE TONGUE EVERY 5 MINUTES AS NEEDED FOR CHEST PAIN  . pantoprazole (PROTONIX) 40 MG tablet TAKE 1 TABLET(40 MG) BY MOUTH DAILY  . gabapentin (NEURONTIN) 600 MG tablet Take 1,200 mg by mouth as needed.   . [DISCONTINUED] furosemide (LASIX) 40 MG tablet TAKE TWO TABLETS BY MOUTH IN THE MORNING AND TAKE ONE TABLET IN THE EVENING  . [DISCONTINUED] isosorbide mononitrate (IMDUR) 30 MG 24 hr tablet TAKE 1 TABLET(30 MG) BY MOUTH EVERY MORNING   Facility-Administered Encounter Medications as of 05/27/2017  Medication  . fentaNYL (SUBLIMAZE) injection 25 mcg     REVIEW OF SYSTEMS  : All other systems reviewed and negative except where noted in the History of Present Illness.   PHYSICAL EXAM: BP (!) 104/48 (BP Location: Left Arm, Patient Position: Sitting, Cuff Size: Large)   Pulse 88 Comment: irregular  Ht 5\' 5"  (1.651 m)   Wt 197 lb 2 oz (89.4 kg)   BMI 32.80 kg/m  General: Well developed white female in no acute distress Head: Normocephalic and atraumatic Eyes:  Sclerae anicteric, conjunctiva pink. Ears: Normal auditory acuity Lungs: Clear throughout to auscultation; no increased WOB.  Tender over  right ribcage/chest wall. Heart: Regular rate and rhythm; no M/R/G. Abdomen: Soft, non-distended.  BS present.  Diffuse upper abdominal TTP. Musculoskeletal: Symmetrical with no gross deformities  Skin: No lesions on visible extremities Extremities: No edema  Neurological: Alert oriented x 4, grossly non-focal Psychological:  Alert and cooperative. Normal mood and affect  ASSESSMENT AND PLAN: *76 year old female with a past medical history of chronic epigastric abdominal pain, pancreas divisum with history of ERCP with biliary sphincterotomy, CAD and peripheral vascular disease, celiac artery stenosis now status post stent placement in May 2018, chronic loose stools and abdominal pain.  Here today with 4 weeks of worsening upper abdominal pain, nausea and vomiting with inability to eat or drink much at all.    Per Dr. Vena Rua note in 01/2017:  "She has been evaluated by biliary specialist at [facility], Dr. Harl Bowie, who did not feel pain was secondary to bile duct stricture or obstruction. She now has had her celiac artery opened by stent placement without improvement in her upper abdominal pain. This is certainly a chronic pain and after thorough and complete evaluation this is most likely going to continue to be a chronic pain with a component of irritable bowel. She also has diarrhea which has been evaluated with negative stool studies, normal fecal elastase arguing against pancreatic insufficiency, and negative biopsies for microscopic colitis."  She had been given Elavil 25 mg to take daily at bedtime. She has not been taking this as she thought it was only as needed. I would like her to begin taking that regularly. We will repeat a CT scan of the abdomen and pelvis with pancreatic protocol. We'll check labs today including a  CBC, BMP, hepatic function panel, amylase, lipase.  I'm going to give her Zofran ODT 4 mg. I would like her to take this around the clock every 6 hours.   CC:  Kristopher Glee.,  MD  Addendum: Reviewed and agree with initial management. Pyrtle, Lajuan Lines, MD

## 2017-05-27 NOTE — Patient Instructions (Addendum)
Please go to the basement level to have your labs drawn.  Take Elavil at bedtime.   We have sent the following medications to your pharmacy for you to pick up at your convenience: [place], [place] 1. Zofran ODT 4 mg   You have been scheduled for a CT scan of the abdomen and pelvis at [facility] ([address]---this is in the same building as Press [facility]).   You are scheduled on Thursday at 06-05-2017 at 10:00 am. You should arrive 15 minutes prior to your appointment time for registration. Please follow the written instructions below on the day of your exam:  WARNING: IF YOU ARE ALLERGIC TO IODINE/X-RAY DYE, PLEASE NOTIFY RADIOLOGY IMMEDIATELY AT [phone]! YOU WILL BE GIVEN A 13 HOUR PREMEDICATION PREP.  1) Do not eat anything after 6:00 am (4 hours prior to your test) 2) You have been given 2 bottles of oral contrast to drink. The solution may taste               better if refrigerated, but do NOT add ice or any other liquid to this solution. Shake             well before drinking.    Drink 1 bottle of contrast @ 8:00 am(2 hours prior to your exam)  Drink 1 bottle of contrast @ 9:00 am (1 hour prior to your exam)  You may take any medications as prescribed with a small amount of water except for the following: Metformin, Glucophage, Glucovance, Avandamet, Riomet, Fortamet, Actoplus Met, Janumet, Glumetza or Metaglip. The above medications must be held the day of the exam AND 48 hours after the exam.  The purpose of you drinking the oral contrast is to aid in the visualization of your intestinal tract. The contrast solution may cause some diarrhea. Before your exam is started, you will be given a small amount of fluid to drink. Depending on your individual set of symptoms, you may also receive an intravenous injection of x-ray contrast/dye. Plan on being at [facility] for 30 minutes or long, depending on the type of exam you are having  performed.  If you have any questions regarding your exam or if you need to reschedule, you may call the CT department at [phone] between the hours of 8:00 am and 5:00 pm, Monday-Friday.  ________________________________________________________________________

## 2017-05-29 ENCOUNTER — Telehealth: Payer: Self-pay | Admitting: Gastroenterology

## 2017-05-29 NOTE — Telephone Encounter (Signed)
Spoke with pt and let her know Dr. Vena Rua comments. Pt was not happy with the pain management option. States anyone can manage pain, she wants to know what is wrong. Asked if she needed to find another doctor. Offered to refer her to someone and she states she will find one herself. Just wants to know if she needs to have the CT scan next week.

## 2017-05-29 NOTE — Telephone Encounter (Signed)
-----   Message from Jerene Bears, MD sent at 05/28/2017  5:00 PM EDT ----- Pt was sent to the ED last night with abd pain, low K  All was supplemented Her LFTs and lipase were normal Her CT of her abd is normal I do not have any further recommendation for her chronic abd pain.   I do think being back on the amitriptyline would be helpful for her chronic pain.  This is best taken nightly. She has been seen at [facility] and they do not think ERCP or sphincterotomy would be helpful. Pain management clinic may be the best thing left to offer at this point I understand her frustration, but do not know what else to offer. Please copy this to the note and let pt know Can refer to pain management if she is interested

## 2017-05-29 NOTE — Telephone Encounter (Signed)
Pt wants to know if she still needs to have CT scan ordered for next week and if she needs to drink the contrast. Pt thought the one Janett Billow ordered was going to be more involved and look at her ducts and her side. Please advise.

## 2017-05-30 DIAGNOSIS — R1011 Right upper quadrant pain: Secondary | ICD-10-CM | POA: Diagnosis not present

## 2017-05-30 DIAGNOSIS — R112 Nausea with vomiting, unspecified: Secondary | ICD-10-CM | POA: Diagnosis not present

## 2017-05-30 DIAGNOSIS — R197 Diarrhea, unspecified: Secondary | ICD-10-CM | POA: Diagnosis not present

## 2017-05-30 DIAGNOSIS — M940 Chondrocostal junction syndrome [Tietze]: Secondary | ICD-10-CM | POA: Diagnosis not present

## 2017-06-01 NOTE — Telephone Encounter (Signed)
She does not need another CT She had one when she was in the ED the other night I am sorry that we have not been able to solve/prevent her abd pain. This pain is chronic and she was seen by Dr. Carlean Purl, then me and has been seen several times at [facility] by advanced biliary specialists.  She has had multiple imaging tests including MRI/MRCP, secretin-stimulated MRI, CT abd/pelvis (multiple) and labs tests which fail to show elevation in LFTs or lipase. She also had celiac artery intervention with Dr. Trula Slade to address the possibility of mesenteric ischemia (which did not help abd pain). From a GI perspective, I am out of great options.  I do think the amitriptyline would help with chronic pain. I do think a pain management specialist would also be helpful. I support another GI opinion with another group if that is what she wishes. I wish her well in the future

## 2017-06-02 NOTE — Telephone Encounter (Signed)
Spoke with pt and she is aware.

## 2017-06-05 ENCOUNTER — Other Ambulatory Visit: Payer: Medicare Other

## 2017-06-06 DIAGNOSIS — I10 Essential (primary) hypertension: Secondary | ICD-10-CM | POA: Diagnosis not present

## 2017-06-13 DIAGNOSIS — I10 Essential (primary) hypertension: Secondary | ICD-10-CM | POA: Diagnosis not present

## 2017-06-20 DIAGNOSIS — E875 Hyperkalemia: Secondary | ICD-10-CM | POA: Diagnosis not present

## 2017-06-23 ENCOUNTER — Other Ambulatory Visit: Payer: Self-pay | Admitting: *Deleted

## 2017-06-23 DIAGNOSIS — I701 Atherosclerosis of renal artery: Secondary | ICD-10-CM

## 2017-07-15 ENCOUNTER — Telehealth: Payer: Self-pay | Admitting: Cardiology

## 2017-07-15 NOTE — Telephone Encounter (Signed)
New message    Patient states her Potassium was has been low per PCP.    Pt c/o of Chest Pain: STAT if CP now or developed within 24 hours  1. Are you having CP right now? NO  2. Are you experiencing any other symptoms (ex. SOB, nausea, vomiting, sweating)? nausea  3. How long have you been experiencing CP? A few weeks  4. Is your CP continuous or coming and going? Coming and going, worse in the mornings  5. Have you taken Nitroglycerin? NO ?

## 2017-07-15 NOTE — Telephone Encounter (Signed)
Return call to pt, she states that she is having chest pain and nausea and PCP did labs there and K+ was low and PCP said that this was why she was having chest pain and nauseas. K+ was 2.9 then went up to 3.1. PCP started K+ 76meq BID. Pt states that she is going back to PCP tomorrow or the next day for re-draw, she will have them fax prev lab result along with current lab. But pt states that CP and nausea is better but has not went away. Tried to schedule appt for this afternoon, pt declined and states that she will keep appt next week with Ellyn Hack. Verbalizes instruction to go to the ER if sx worsen. She will call back if anything else is needed.

## 2017-07-18 ENCOUNTER — Ambulatory Visit: Payer: Medicare Other

## 2017-07-23 ENCOUNTER — Ambulatory Visit: Payer: Medicare Other | Admitting: Cardiology

## 2017-07-31 ENCOUNTER — Encounter: Payer: Self-pay | Admitting: Cardiology

## 2017-07-31 ENCOUNTER — Ambulatory Visit (INDEPENDENT_AMBULATORY_CARE_PROVIDER_SITE_OTHER): Payer: Medicare Other | Admitting: Cardiology

## 2017-07-31 VITALS — BP 130/82 | HR 77 | Ht 68.0 in | Wt 196.4 lb

## 2017-07-31 DIAGNOSIS — I5030 Unspecified diastolic (congestive) heart failure: Secondary | ICD-10-CM | POA: Diagnosis not present

## 2017-07-31 DIAGNOSIS — I2510 Atherosclerotic heart disease of native coronary artery without angina pectoris: Secondary | ICD-10-CM | POA: Diagnosis not present

## 2017-07-31 DIAGNOSIS — R002 Palpitations: Secondary | ICD-10-CM | POA: Diagnosis not present

## 2017-07-31 DIAGNOSIS — I701 Atherosclerosis of renal artery: Secondary | ICD-10-CM | POA: Diagnosis not present

## 2017-07-31 DIAGNOSIS — Z9861 Coronary angioplasty status: Secondary | ICD-10-CM

## 2017-07-31 DIAGNOSIS — R0609 Other forms of dyspnea: Secondary | ICD-10-CM

## 2017-07-31 DIAGNOSIS — I10 Essential (primary) hypertension: Secondary | ICD-10-CM

## 2017-07-31 DIAGNOSIS — E876 Hypokalemia: Secondary | ICD-10-CM | POA: Diagnosis not present

## 2017-07-31 DIAGNOSIS — E785 Hyperlipidemia, unspecified: Secondary | ICD-10-CM

## 2017-07-31 MED ORDER — SPIRONOLACTONE 25 MG PO TABS: 25.0000 mg | ORAL_TABLET | ORAL | 3 refills | Status: DC

## 2017-07-31 NOTE — Patient Instructions (Addendum)
RECOMMEND   PURCHASING   ALIVECOR BY KARDIA FROM THE APP STORE   THE APP IS FREE , BUT THE  EQUIPMENT HAS A COST. IT IS A HEART MONITOR  TRACING.    MEDICATION INSTRUCTIONS   - MONDAYS , WEDNESDAYS , AND FRIDAYS -  DO NOT TAKE THE SECOND DOSE TO FUROSEMIDE ( LASIX)  , INSTEAD  TAKE SPIROLACTONE 25 MG ON THESE DAYS  OTHERWISE CONTINUE WITH CURRENT MEDICATIONS     LABS -- POTASSIUM  IN 2 WEEKS   AROUND DEC 13 , 2019 OR NEXT WEEK .    Your physician recommends that you schedule a follow-up appointment in 2-3 MONTHS WITH DR HARDING.

## 2017-07-31 NOTE — Progress Notes (Signed)
PCP: [name]., MD  Clinic Note: Chief Complaint  Patient presents with  . Follow-up    Palpitations  . Coronary Artery Disease    Chest discomfort  . Dizziness    HPI: [name] is a 76 y.o. female with a PMH below who presents today for 2 month f/u (was supposed to see CVRR) -but now presents with complaints of palpitations.[name] has a history of known CAD-PCI as well as chronic diastolic dysfunction and PAD /splanchnic artery disease.  CAD: pRCA & mLAD x 2 in 2011 (BMS stents used for preop back surgery), DES x 2 LAD for ISR Myoview May 2018: Normal EF (hyperdynamic LV function EF greater than 65%).  LOW RISK.  No ischemia or infarction. PAD: - RAS - s/p renal stent; celiac/SMA stenosis s/p SMA stent (recent)  Also has very difficult to control lipids with statin intolerance and refusal to take other medications. - Has been evaluated by our [facility] team - but stopped going  Chronic edema at least in part partially related to diastolic dysfunction, also related to venous insufficiency. - Usually controlled with Lasix and metolazone [name] was last seen on May 07, 2017 --was actually doing relatively well from a cardiac symptom.  She is mostly dealing with her GI issues of nausea emesis and loose stools.  Continuing weight loss.  Noted well-controlled swelling.  And also well controlled chest discomfort.  Recent Hospitalizations:   Sept 25 - hypokalemia  Studies Personally Reviewed - (if available, images/films reviewed: From Epic Chart or Care Everywhere)  Cardiac event monitor May-June 2018. Pretty normal with no heart rhythm issues. No significant irregularities.  Symptoms noted with PVCs  Interval History:  [name] returns today concerned about having recurrence of palpitations.  She has had increased amount of palpitations over the last week or so and one was associated with some chest discomfort.  She is starting to feel dizzy and lightheaded  with these episodes.  Sometimes having 3 times a week, but not always.  Not consistent. She is noticing more dizzy and lightheaded symptoms then palpitations at this point.  And feels like she has some orthostatic type dizziness.  Despite this, she continues to be active and does all her kitchen chores and cooking.  She says that her swelling is pretty well controlled with the Lasix and metolazone.  She does use metolazone every day. She is noted to have some hypokalemia which is somewhat concerning.  Despite having stable edema she has her baseline orthopnea but no PND. Not really having much in way of any chest tightness or pressure.  No syncope/near syncope or TIA/amaurous fugax. No melena, hematochezia, hematuria, or epistaxis. No claudication.  ROS: A comprehensive was performed. Review of Systems  Constitutional: Positive for malaise/fatigue and weight loss. Negative for chills and fever.  HENT: Negative for congestion.   Respiratory: Negative for cough, shortness of breath and wheezing.   Cardiovascular: Positive for chest pain and palpitations.  Gastrointestinal: Positive for abdominal pain, diarrhea and nausea. Negative for blood in stool, melena and vomiting.  Genitourinary: Negative for dysuria, frequency and hematuria.  Musculoskeletal: Positive for joint pain and myalgias.  Neurological: Positive for dizziness (With palpitations, and orthostatic). Negative for weakness.  Psychiatric/Behavioral: The patient is nervous/anxious and has insomnia.   All other systems reviewed and are negative.   I have reviewed and (if needed) personally updated the patient's problem list, medications, allergies, past medical and surgical history, social and family history.  Past Medical History:  Diagnosis Date  . Arthritis    "middle finger right hand" (01/14/2017)  . Atrial fibrillation ([place])   . Back pain with radiation    Chronic Back Pain - mutliple surgeries (including tumor removal)    . Bilateral edema of lower extremity    Chronic, likely related to venous stasis  . CAD S/P percutaneous coronary angioplasty    a) LHC: 07/30/10. -- 3.0x32mm Integrity BMS to pRCA & 2.5x 71mm Integrity BMS mLAD(@ D2).  b) Class III-IV Angina 03/2011: LHC- ISR in LAD BMS -- prox overlapping Promus DES 2.5x64mm and PTCA of jailed D2 ostium-prox 80%. c) 02/03/12:  LHC- patent stents.  Jailed diagonal. with stable flow; d) Peri-OP NSTEMI 04/2012 - LHC in 12/'13 -   . Celiac artery stenosis ([place])    12/2016 - STENT placement  . Complication of anesthesia    "used to wake up wild" (01/14/2017)  . Congestive heart failure with LV diastolic dysfunction, NYHA class 2 ([place])    06/13/10:  last 2D echo-  EF >55%, Mild TR, Mod Conc LVH - Grade 1 diastolic dysfunction (abnormal relaxation) --> LVEDP on Cath 28 mmHg & mild 2nd Pulm HTN  . Diverticulitis of colon (without mention of hemorrhage)(562.11)   . Dyslipidemia, goal LDL below 70    Intolerant to statins  . GERD (gastroesophageal reflux disease)   . Hepatitis ~ 1957   "yellow jaundice" (01/14/2017)  . Hepatomegaly   . Hiatal hernia   . History of blood transfusion    "when I had a heart attack"  . History of kidney stones   . History of stomach ulcers "years ago"  . Irritable bowel syndrome (IBS)   . Labile essential hypertension    Partially related to RAS  . Mesenteric artery stenosis (H[facility])    95% Celiac Artery - ostial, 20-30% SMA.  Bilateral Renal A: L RA stent patent, R RA 20-30% -- Conservative Management  . NSTEMI (non-ST elevated myocardial infarction) ([place]) 04/2012   Unclear the details, apparently this was postoperative from her back surgery that she was cleared for my last saw her in June.  Reportedly had stents placed  . Pancreas divisum    On pancreatic enzyme  . Renal artery stenosis ([place]) 2011; 12/'13   a) Angiogram 02/03/12:  50-60%L RA stenosis, 40% R R Inferior artery; b) 12/'13: S/P L RA Stent ([facility]) 6.0 mm x 15 mm; c)  Renal Duplex 10/2013: <60% L RA, <60 R RA, ~60% SMA & Celiac A.  . Stricture and stenosis of esophagus   . Type II diabetes mellitus ([place])    "borderline; not on RX" (01/14/2017)  . Unspecified disorder of thyroid   . Unspecified gastritis and gastroduodenitis without mention of hemorrhage     Past Surgical History:  Procedure Laterality Date  . APPENDECTOMY    . BACK SURGERY    . BREAST DUCTAL SYSTEM EXCISION     right  . BREAST SURGERY Right   . CARDIAC CATHETERIZATION N/A 03/16/2015   Procedure: Left Heart Cath and Coronary Angiography;  Surgeon: Leonie Man, MD;  Location: [facility];  Service: Cardiovascular; Widely patent m-dLAD stents as wellas pRCA stent.  High LVEDP, small Diag & Om vessels with moderate stenosis   . Cardiac Event Monitor  01/2017   Mostly NSR with occasional sinus tachycardia and rare bradycardia. No A. fib. No PND or PSVT. Rare PACs and PVCs.  Marland Kitchen CARDIOVASCULAR STRESS TEST  11/07/11:  Normal perfusion pattern.  EF 66% No wall motion abnormalities.   Marland Kitchen CATARACT EXTRACTION W/ INTRAOCULAR LENS  IMPLANT, BILATERAL Bilateral   . CHOLECYSTECTOMY OPEN    . COLONOSCOPY  03/01/2009   normal   . CORONARY ANGIOPLASTY WITH STENT PLACEMENT  07/2010, 02/2011   LHC: 07/30/10.  3.0x4mm Integrity BMS to RCA and 2.5x 18mm Integrity BMS LAD.  03/2011: LHC- ISRS in LAD treated with overlapping Promus DES 2.5x37mm and PTCA of Diagonal.  02/03/12:  LHC- patent stents.  Jailed diagonal.  . DOBUTAMINE STRESS ECHO  03/07/2015   [facility] (ordered for pre-op evaluation for EUS/ERCP --> abnormal EKG:  strreesss test shhoowwed 1 mm ST segment depressions downsloping. No wall motion abnormalities at peak exercise or at rest. Diastolic dysfunction was noted but normal systolic function - EF greater than 55%. No bouts of regurgitation or stenosis. Resting hypertension with exaggerated response  . ESOPHAGOGASTRODUODENOSCOPY  04/08/2012  . FOOT SURGERY Bilateral    "took bone out of 2nd toe  on each foot"  . HIP SURGERY     left  . JOINT REPLACEMENT    . KNEE SURGERY Left    "had fluid drained off it a couple times"  . LEFT HEART CATHETERIZATION WITH CORONARY ANGIOGRAM N/A 02/03/2012   Procedure: LEFT HEART CATHETERIZATION WITH CORONARY ANGIOGRAM;  Surgeon: Leonie Man, MD;  Location: [facility] CATH LAB;  Service: Cardiovascular;  Laterality: N/A;  . LEFT HEART CATHETERIZATION WITH CORONARY ANGIOGRAM N/A 05/12/2014   Procedure: LEFT HEART CATHETERIZATION WITH CORONARY ANGIOGRAM;  Surgeon: Lorretta Harp, MD;  Location: [facility] CATH LAB;  Service: Cardiovascular: Stable CAD. Patent stents. Patent renal artery stent  . LEFT HEART CATHETERIZATION WITH CORONARY ANGIOGRAM  08/2012   Peri-Op MI @ [facility] -- 40% ostial D1, patent LAD stents, 10% RCA ISR  . LEFT HEART CATHETERIZATION WITH CORONARY ANGIOGRAM   03/2012   ~30% ISR RCA, patent LAD stent & D2; EDP ~28 mmHg  . NM MYOVIEW LTD  12/2016   LOW RISK study. No ischemia or infarction. EF 65-75%.  Marland Kitchen PANCREAS SURGERY     "stint in my pancreas"; Dr Pershing Proud  . PERCUTANEOUS PINNING PHALANX FRACTURE OF HAND Left ~ 2013  . PERIPHERAL VASCULAR INTERVENTION  01/14/2017   Procedure: Peripheral Vascular Intervention;  Surgeon: Serafina Mitchell, MD;  Location: [facility];  Service: Cardiovascular;;  mesentric  . RENAL ARTERY STENT Left 08/2012   @ [facility] - 6.0 mm x 15 mm  . SPINE SURGERY     tumor removed 07/2010; Redo Surgery 04/2012; Sacroiliac Sgx 08/2013  . TOTAL ABDOMINAL HYSTERECTOMY    . TOTAL KNEE ARTHROPLASTY Left ~ 2012  . VISCERAL ANGIOGRAM N/A 05/12/2014   Procedure: VISCERAL ANGIOGRAM;  Surgeon: Lorretta Harp, MD;  Location: [facility] CATH LAB;  Service: Cardiovascular;  Laterality: N/A;  . VISCERAL ANGIOGRAPHY N/A 01/14/2017   Procedure: Mesenteric  Angiography;  Surgeon: Serafina Mitchell, MD;  Location: [facility];  Service: Cardiovascular;  Laterality: N/A;    Current Meds  Medication Sig  .  acetaminophen (TYLENOL) 325 MG tablet Take 2 tablets (650 mg total) by mouth 3 (three) times daily as needed for mild pain or moderate pain.  Marland Kitchen acetaminophen (TYLENOL) 500 MG tablet Take 2,000 mg by mouth every 6 (six) hours as needed for moderate pain.  Marland Kitchen amitriptyline (ELAVIL) 25 MG tablet TAKE 1 TABLET(25 MG) BY MOUTH AT BEDTIME  . carvedilol (COREG) 6.25 MG  tablet Take 6.25 mg by mouth 2 (two) times daily with a meal.  . clopidogrel (PLAVIX) 75 MG tablet TAKE 1 TABLET(75 MG) BY MOUTH DAILY  . gabapentin (NEURONTIN) 600 MG tablet Take 600 mg by mouth 3 (three) times daily.   . isosorbide mononitrate (IMDUR) 60 MG 24 hr tablet Take 60 mg by mouth daily.  Marland Kitchen lidocaine (XYLOCAINE) 5 % ointment Apply 1 application topically at bedtime.   Marland Kitchen losartan (COZAAR) 50 MG tablet TAKE 1 TABLET(50 MG) BY MOUTH DAILY  . metolazone (ZAROXOLYN) 2.5 MG tablet Take 1 to 2 tablets  by mouth 30-60 minutes prior to morning dose of lasix AS NEEDED (Patient taking differently: Take 2.5 mg by mouth daily. )  . nitroGLYCERIN (NITROSTAT) 0.4 MG SL tablet DISSOLVE 1 TABLET UNDER THE TONGUE EVERY 5 MINUTES AS NEEDED FOR CHEST PAIN  . ondansetron (ZOFRAN ODT) 4 MG disintegrating tablet Dissolve 1 tablet on the tongue every 6 hours.   Current Facility-Administered Medications for the 07/31/17 encounter (Office Visit) with Leonie Man, MD  Medication  . fentaNYL (SUBLIMAZE) injection 25 mcg    Allergies  Allergen Reactions  . Codeine Phosphate Anaphylaxis, Shortness Of Breath and Swelling  . Iodine Swelling and Rash    REACTION: unspecified This allergy is to Topical iodine only.   . Prednisone Itching and Rash    Keeps pt up all night   . Statins Other (See Comments)    Intolerant   . Tramadol Other (See Comments)    Kept patient awake    Social History   Socioeconomic History  . Marital status: Married    Spouse name: None  . Number of children: 1  . Years of education: None  . Highest education  level: None  Social Needs  . Financial resource strain: None  . Food insecurity - worry: None  . Food insecurity - inability: None  . Transportation needs - medical: None  . Transportation needs - non-medical: None  Occupational History  . Occupation: Programmer, applications  Tobacco Use  . Smoking status: Never Smoker  . Smokeless tobacco: Never Used  Substance and Sexual Activity  . Alcohol use: No  . Drug use: No  . Sexual activity: Not Currently  Other Topics Concern  . None  Social History Narrative   Married mother of one, one grandchild.   Very socially active, enjoys cooking and having get-togethers her house. She had been exercising regularly but her back pain limits her.   Does not smoke, does not drink alcohol.   Family History family history includes Cancer in her father; Colitis in her maternal grandfather; Diabetes in her brother; Heart attack in her brother, brother, brother, and father; Heart disease in her father; Stroke in her brother and mother.  Wt Readings from Last 3 Encounters:  07/31/17 196 lb 6.4 oz (89.1 kg)  05/27/17 197 lb (89.4 kg)  05/27/17 197 lb 2 oz (89.4 kg)    PHYSICAL EXAM BP 130/82   Pulse 77   Ht 5\' 8"  (1.727 m)   Wt 196 lb 6.4 oz (89.1 kg)   BMI 29.86 kg/m  Physical Exam  Constitutional: She is oriented to person, place, and time. She appears well-developed and well-nourished. No distress (Tired and ill-appearing. But not toxic.).  HENT:  Head: Normocephalic and atraumatic.  Neck: Normal range of motion. Neck supple. No hepatojugular reflux and no JVD present. Carotid bruit is not present (Cannot exclude soft bruit on the right).  Cardiovascular: Normal rate, regular  rhythm, S1 normal, S2 normal and intact distal pulses.  Occasional extrasystoles are present. PMI is not displaced. Exam reveals gallop, S4 and distant heart sounds.  Murmur heard.  Medium-pitched harsh crescendo-decrescendo early systolic murmur is present with a grade of 1/6  at the upper right sternal border radiating to the neck. Pulmonary/Chest: Effort normal and breath sounds normal. No respiratory distress. She has no wheezes. She has no rales.  Abdominal: Soft. Bowel sounds are normal. She exhibits no distension. There is no tenderness. There is no rebound.  Musculoskeletal: Normal range of motion. She exhibits edema (Trivial).  Neurological: She is alert and oriented to person, place, and time.  Skin: Skin is warm and dry. No rash noted. No erythema. No pallor.  Psychiatric: She has a normal mood and affect. Her behavior is normal. Judgment and thought content normal.  Answers questions appropriately  Nursing note and vitals reviewed.   Adult ECG Report Normal sinus rhythm, rate 77 bpm.  Nonspecific ST and T wave changes.  Otherwise normal EKG.  No significant change.  Other studies Reviewed: Additional studies/ records that were reviewed today include:  Recent Labs:    Lab Results  Component Value Date   CHOL 238 (H) 05/07/2017   HDL 35 (L) 05/07/2017   LDLCALC 130 (H) 05/07/2017   TRIG 365 (H) 05/07/2017   CHOLHDL 6.8 (H) 05/07/2017   Lab Results  Component Value Date   CREATININE 1.21 (H) 05/27/2017   BUN 23 (H) 05/27/2017   NA 135 05/27/2017   K 2.9 (L) 05/27/2017   CL 94 (L) 05/27/2017   CO2 27 05/27/2017   -- PCP check - K ~ 3-3.1  ASSESSMENT / PLAN: Problem List Items Addressed This Visit    CAD S/P percutaneous coronary angioplasty: pRCA BMS, mLAD BMS overlapped prox with DES for ISR (Chronic)    Yet again she has had a negative Myoview stress test to evaluate chest discomfort.  She probably has some microvascular disease, but it may be more GI related at this point. Continue Plavix along with beta-blocker and ARB along with Imdur.  Continue to treat diastolic dysfunction with diuretic.      Relevant Medications   spironolactone (ALDACTONE) 25 MG tablet   Congestive heart failure with LV diastolic dysfunction, NYHA class 2  (HCC) (Chronic)    I suspect she does have some diastolic dysfunction and she is on a chronic dose of diuretic. What I would like to do is have her convert her second daily dose of furosemide to spironolactone.  This is probably because of her hypokalemia. Plan will be to start with 3 days a week replacing afternoon dose of Lasix with 25 mg spironolactone.  If she tolerates this, I would potentially make this a permanent move forward daily basis.  This will help some with afterload reduction and avoid hypokalemia. Otherwise continue carvedilol.  Switching carvedilol to dinnertime to avoid daytime dizziness.      Relevant Medications   spironolactone (ALDACTONE) 25 MG tablet   Other Relevant Orders   EKG 12-Lead (Completed)   Dyslipidemia, goal LDL below 70 - WITH STATIN INTOLERANCE (Chronic)    We did not discuss this today --she has been reluctant to show up at the CV RR (Cardiovascular Risk Reduction) clinic.  She is no longer taking Zetia, and has been reluctant to take any statins. We will need to confirm how much her co-pay would be based on her Medicare part B plan.  I continue to try to refer  her to CV RR.      Relevant Medications   spironolactone (ALDACTONE) 25 MG tablet   Essential hypertension (Chronic)   Relevant Medications   spironolactone (ALDACTONE) 25 MG tablet   Exertional dyspnea (Chronic)    Chronic.  At this point based on negative Myoview with normal EF, I suspect it is probably deconditioning.  However with a hyperdynamic left ventricle could still be diastolic dysfunction mediated. Finally she is actually losing weight even though it is not intentional.  Unfortunately with her dizziness, I am reluctant to be more aggressive with afterload reduction , And will change losartan to dinnertime.      Hypokalemia    Check chemistry panel to ensure her potassium level is stable.  She is currently taking potassium limitation, I would like to replace her afternoon dose of  furosemide with spironolactone as a potassium sparing diuretic.      Relevant Orders   Basic metabolic panel   Palpitation - Primary (Chronic)    Unfortunately, her monitor options are very limited because of poor signal where they live.  Also her episodes are so sporadic that we may not catch again with a monitor.  I know she is probably having PVCs, cannot exclude an arrhythmia.  We talked about both her and her husband purchasing the [employer] phone application in order to be a check for arrhythmias as needed. She remains on steady dose of carvedilol and has resting bradycardia, so I would not further titrate unless we truly see an arrhythmia.      Relevant Orders   EKG 12-Lead (Completed)   Basic metabolic panel      Current medicines are reviewed at length with the patient today. (+/- concerns) n/a The following changes have been made: n/a  Patient Instructions  RECOMMEND   [place] APP IS FREE , BUT THE  EQUIPMENT HAS A COST. IT IS A HEART MONITOR  TRACING.    MEDICATION INSTRUCTIONS   - MONDAYS , WEDNESDAYS , AND FRIDAYS -  DO NOT TAKE THE SECOND DOSE TO FUROSEMIDE ( LASIX)  , INSTEAD  TAKE SPIROLACTONE 25 MG ON THESE DAYS  OTHERWISE CONTINUE WITH CURRENT MEDICATIONS     LABS -- POTASSIUM  IN 2 WEEKS   AROUND DEC 13 , 2019 OR NEXT WEEK .    Your physician recommends that you schedule a follow-up appointment in 2-3 MONTHS WITH DR HARDING.     Studies Ordered:   Orders Placed This Encounter  Procedures  . Basic metabolic panel  . EKG 12-Lead      Glenetta Hew, M.D., M.S. Interventional Cardiologist   Pager # [phone] Phone # [phone] [address] [place], [place] [address]

## 2017-08-02 ENCOUNTER — Encounter: Payer: Self-pay | Admitting: Cardiology

## 2017-08-02 NOTE — Assessment & Plan Note (Signed)
Check chemistry panel to ensure her potassium level is stable.  She is currently taking potassium limitation, I would like to replace her afternoon dose of furosemide with spironolactone as a potassium sparing diuretic.

## 2017-08-02 NOTE — Assessment & Plan Note (Signed)
Yet again she has had a negative Myoview stress test to evaluate chest discomfort.  She probably has some microvascular disease, but it may be more GI related at this point. Continue Plavix along with beta-blocker and ARB along with Imdur.  Continue to treat diastolic dysfunction with diuretic.

## 2017-08-02 NOTE — Assessment & Plan Note (Signed)
We did not discuss this today --she has been reluctant to show up at the CV RR (Cardiovascular Risk Reduction) clinic.  She is no longer taking Zetia, and has been reluctant to take any statins. We will need to confirm how much her co-pay would be based on her Medicare part B plan.  I continue to try to refer her to CV RR.

## 2017-08-02 NOTE — Assessment & Plan Note (Signed)
I suspect she does have some diastolic dysfunction and she is on a chronic dose of diuretic. What I would like to do is have her convert her second daily dose of furosemide to spironolactone.  This is probably because of her hypokalemia. Plan will be to start with 3 days a week replacing afternoon dose of Lasix with 25 mg spironolactone.  If she tolerates this, I would potentially make this a permanent move forward daily basis.  This will help some with afterload reduction and avoid hypokalemia. Otherwise continue carvedilol.  Switching carvedilol to dinnertime to avoid daytime dizziness.

## 2017-08-02 NOTE — Assessment & Plan Note (Signed)
Chronic.  At this point based on negative Myoview with normal EF, I suspect it is probably deconditioning.  However with a hyperdynamic left ventricle could still be diastolic dysfunction mediated. Finally she is actually losing weight even though it is not intentional.  Unfortunately with her dizziness, I am reluctant to be more aggressive with afterload reduction , And will change losartan to dinnertime.

## 2017-08-02 NOTE — Assessment & Plan Note (Addendum)
Unfortunately, her monitor options are very limited because of poor signal where they live.  Also her episodes are so sporadic that we may not catch again with a monitor.  I know she is probably having PVCs, cannot exclude an arrhythmia.  We talked about both her and her husband purchasing the [employer] phone application in order to be a check for arrhythmias as needed. She remains on steady dose of carvedilol and has resting bradycardia, so I would not further titrate unless we truly see an arrhythmia.

## 2017-08-23 ENCOUNTER — Other Ambulatory Visit: Payer: Self-pay | Admitting: Internal Medicine

## 2017-08-25 NOTE — Telephone Encounter (Signed)
Dr Hilarie Fredrickson, this patient is requesting refills on pantoprazole from you. However, it appears per a telephone call on 05/29/17 that patient planned to leave our practice as she was no longer satisfied with with our care; stated she was going to be finding her own GI physician elsewhere. Am I to continue filling this?

## 2017-08-28 DIAGNOSIS — E1142 Type 2 diabetes mellitus with diabetic polyneuropathy: Secondary | ICD-10-CM | POA: Diagnosis not present

## 2017-08-28 DIAGNOSIS — L97512 Non-pressure chronic ulcer of other part of right foot with fat layer exposed: Secondary | ICD-10-CM | POA: Diagnosis not present

## 2017-08-28 DIAGNOSIS — E11621 Type 2 diabetes mellitus with foot ulcer: Secondary | ICD-10-CM | POA: Diagnosis not present

## 2017-08-28 DIAGNOSIS — M2041 Other hammer toe(s) (acquired), right foot: Secondary | ICD-10-CM | POA: Diagnosis not present

## 2017-09-01 ENCOUNTER — Other Ambulatory Visit: Payer: Self-pay | Admitting: Internal Medicine

## 2017-09-01 NOTE — Telephone Encounter (Signed)
Was seen in our clinic 3 months ago She apparently stated she would like to seek GI care elsewhere She has chronic abd pain and has had very extensive GI eval Ok for 1 refill of pantoprazole while she establishes care elsewhere (unless she has changed her mind)

## 2017-09-03 ENCOUNTER — Encounter (HOSPITAL_COMMUNITY): Payer: Medicare Other

## 2017-09-03 ENCOUNTER — Ambulatory Visit: Payer: Medicare Other | Admitting: Family

## 2017-09-04 ENCOUNTER — Encounter: Payer: Self-pay | Admitting: *Deleted

## 2017-09-04 ENCOUNTER — Ambulatory Visit (INDEPENDENT_AMBULATORY_CARE_PROVIDER_SITE_OTHER): Payer: Medicare Other | Admitting: Family

## 2017-09-04 ENCOUNTER — Encounter: Payer: Self-pay | Admitting: Family

## 2017-09-04 ENCOUNTER — Other Ambulatory Visit: Payer: Self-pay | Admitting: *Deleted

## 2017-09-04 ENCOUNTER — Ambulatory Visit (HOSPITAL_COMMUNITY)
Admission: RE | Admit: 2017-09-04 | Discharge: 2017-09-04 | Disposition: A | Discharge status: Home / Self Care | Payer: Medicare Other | Source: Ambulatory Visit | Attending: Family | Admitting: Family

## 2017-09-04 ENCOUNTER — Other Ambulatory Visit: Payer: Self-pay

## 2017-09-04 VITALS — BP 140/57 | HR 87 | Temp 97.7°F | Resp 16 | Ht 68.0 in | Wt 190.0 lb

## 2017-09-04 DIAGNOSIS — K551 Chronic vascular disorders of intestine: Secondary | ICD-10-CM | POA: Diagnosis not present

## 2017-09-04 DIAGNOSIS — I771 Stricture of artery: Secondary | ICD-10-CM

## 2017-09-04 DIAGNOSIS — I774 Celiac artery compression syndrome: Secondary | ICD-10-CM | POA: Diagnosis not present

## 2017-09-04 DIAGNOSIS — R109 Unspecified abdominal pain: Secondary | ICD-10-CM | POA: Diagnosis not present

## 2017-09-04 NOTE — Progress Notes (Signed)
Follow up Celiac artery stenosis s/p stent placement  History of Present Illness  [name] is a 77 y.o. (Nov 19, 1940) female who is s/p celiac artery stenting on 01/14/2017 by Dr. Trula Slade using a 7 x 19 Omnilink stent for a 90% stenosis.  Unfortunately, the patient has not had any change in her symptoms since the stent placement.  The patient has had chronic abdominal pain.  She had exhausted all medical options to relieve her pain.  The only thing remaining was her celiac stenosis.She has a long-standing history of abdominal pain. She underwent cholecystectomy at age 28 she is been diagnosed with pancreatic divisum and has had stents placed 20 years ago at [facility]. She continues to have abdominal pain. She has undergone multiple trials which have been unsuccessful with resolving her pain.   She has had diarrhea and vomiting for the last 2 months. She feels slightly better recently. She has severe bilateral upper quadrant abdominal pain and bilateral flank pain after eating, but she has the same type pain other times that is not post prandial.   Dr. Trula Slade last evaluated pt on 03-03-17. At that time mesenteric ultrasound: Unable to visualize celiac artery. Celiac stenting: Unfortunately, the patient did not have any relief of her abdominal pain after stenting.  She will now be on protocol for follow-up. Leg swelling: Venous reflux examination was essentially normal. Dr. Trula Slade counseled the patient to wear compression stockings and return in 6 months for possible evaluation of a Normatec device.   Pt does not need a Normatec device; her lower legs swelling has mostly subsided since her significant unintentional weight loss.  She is taking metolazone and Lasix for this.   She had a left renal artery stent placed by a cardiologist in [facility], and Dr. Ellyn Hack has been checking this with ultrasounds.    Diabetic: states since her weight loss her A1C is below DM threshold Tobacco use:  never  She takes Plavix, no ASA. She is statin intolerant.   Past Medical History:  Diagnosis Date  . Arthritis    "middle finger right hand" (01/14/2017)  . Atrial fibrillation ([place])   . Back pain with radiation    Chronic Back Pain - mutliple surgeries (including tumor removal)  . Bilateral edema of lower extremity    Chronic, likely related to venous stasis  . CAD S/P percutaneous coronary angioplasty    a) LHC: 07/30/10. -- 3.0x8mm Integrity BMS to pRCA & 2.5x 23mm Integrity BMS mLAD(@ D2).  b) Class III-IV Angina 03/2011: LHC- ISR in LAD BMS -- prox overlapping Promus DES 2.5x32mm and PTCA of jailed D2 ostium-prox 80%. c) 02/03/12:  LHC- patent stents.  Jailed diagonal. with stable flow; d) Peri-OP NSTEMI 04/2012 - LHC in 12/'13 -   . Celiac artery stenosis ([place])    12/2016 - STENT placement  . Complication of anesthesia    "used to wake up wild" (01/14/2017)  . Congestive heart failure with LV diastolic dysfunction, NYHA class 2 ([place])    06/13/10:  last 2D echo-  EF >55%, Mild TR, Mod Conc LVH - Grade 1 diastolic dysfunction (abnormal relaxation) --> LVEDP on Cath 28 mmHg & mild 2nd Pulm HTN  . Diverticulitis of colon (without mention of hemorrhage)(562.11)   . Dyslipidemia, goal LDL below 70    Intolerant to statins  . GERD (gastroesophageal reflux disease)   . Hepatitis ~ 1957   "yellow jaundice" (01/14/2017)  . Hepatomegaly   . Hiatal hernia   .  History of blood transfusion    "when I had a heart attack"  . History of kidney stones   . History of stomach ulcers "years ago"  . Irritable bowel syndrome (IBS)   . Labile essential hypertension    Partially related to RAS  . Mesenteric artery stenosis (H[facility])    95% Celiac Artery - ostial, 20-30% SMA.  Bilateral Renal A: L RA stent patent, R RA 20-30% -- Conservative Management  . NSTEMI (non-ST elevated myocardial infarction) ([place]) 04/2012   Unclear the details, apparently this was postoperative from her back surgery that she was  cleared for my last saw her in June.  Reportedly had stents placed  . Pancreas divisum    On pancreatic enzyme  . Renal artery stenosis ([place]) 2011; 12/'13   a) Angiogram 02/03/12:  50-60%L RA stenosis, 40% R R Inferior artery; b) 12/'13: S/P L RA Stent ([facility]) 6.0 mm x 15 mm; c) Renal Duplex 10/2013: <60% L RA, <60 R RA, ~60% SMA & Celiac A.  . Stricture and stenosis of esophagus   . Type II diabetes mellitus ([place])    "borderline; not on RX" (01/14/2017)  . Unspecified disorder of thyroid   . Unspecified gastritis and gastroduodenitis without mention of hemorrhage     Social History Social History   Tobacco Use  . Smoking status: Never Smoker  . Smokeless tobacco: Never Used  Substance Use Topics  . Alcohol use: No  . Drug use: No    Family History Family History  Problem Relation Age of Onset  . Cancer Father        mets  . Heart attack Father   . Heart disease Father   . Stroke Mother   . Heart attack Brother   . Heart attack Brother   . Colitis Maternal Grandfather   . Diabetes Brother   . Stroke Brother   . Heart attack Brother   . Colon cancer Neg Hx   . Stomach cancer Neg Hx     Surgical History Past Surgical History:  Procedure Laterality Date  . APPENDECTOMY    . BACK SURGERY    . BREAST DUCTAL SYSTEM EXCISION     right  . BREAST SURGERY Right   . CARDIAC CATHETERIZATION N/A 03/16/2015   Procedure: Left Heart Cath and Coronary Angiography;  Surgeon: Leonie Man, MD;  Location: [facility];  Service: Cardiovascular; Widely patent m-dLAD stents as wellas pRCA stent.  High LVEDP, small Diag & Om vessels with moderate stenosis   . Cardiac Event Monitor  01/2017   Mostly NSR with occasional sinus tachycardia and rare bradycardia. No A. fib. No PND or PSVT. Rare PACs and PVCs.  Marland Kitchen CARDIOVASCULAR STRESS TEST     11/07/11:  Normal perfusion pattern.  EF 66% No wall motion abnormalities.   Marland Kitchen CATARACT EXTRACTION W/ INTRAOCULAR LENS  IMPLANT, BILATERAL  Bilateral   . CHOLECYSTECTOMY OPEN    . COLONOSCOPY  03/01/2009   normal   . CORONARY ANGIOPLASTY WITH STENT PLACEMENT  07/2010, 02/2011   LHC: 07/30/10.  3.0x47mm Integrity BMS to RCA and 2.5x 44mm Integrity BMS LAD.  03/2011: LHC- ISRS in LAD treated with overlapping Promus DES 2.5x65mm and PTCA of Diagonal.  02/03/12:  LHC- patent stents.  Jailed diagonal.  . DOBUTAMINE STRESS ECHO  03/07/2015   [facility] (ordered for pre-op evaluation for EUS/ERCP --> abnormal EKG:  strreesss test shhoowwed 1 mm ST segment depressions downsloping. No wall motion abnormalities at peak  exercise or at rest. Diastolic dysfunction was noted but normal systolic function - EF greater than 55%. No bouts of regurgitation or stenosis. Resting hypertension with exaggerated response  . ESOPHAGOGASTRODUODENOSCOPY  04/08/2012  . FOOT SURGERY Bilateral    "took bone out of 2nd toe on each foot"  . HIP SURGERY     left  . JOINT REPLACEMENT    . KNEE SURGERY Left    "had fluid drained off it a couple times"  . LEFT HEART CATHETERIZATION WITH CORONARY ANGIOGRAM N/A 02/03/2012   Procedure: LEFT HEART CATHETERIZATION WITH CORONARY ANGIOGRAM;  Surgeon: Leonie Man, MD;  [facility];  Service: Cardiovascular;  Laterality: N/A;  . LEFT HEART CATHETERIZATION WITH CORONARY ANGIOGRAM N/A 05/12/2014   Procedure: LEFT HEART CATHETERIZATION WITH CORONARY ANGIOGRAM;  Surgeon: Lorretta Harp, MD;  Location: [facility] CATH LAB;  Service: Cardiovascular: Stable CAD. Patent stents. Patent renal artery stent  . LEFT HEART CATHETERIZATION WITH CORONARY ANGIOGRAM  08/2012   Peri-Op MI @ [facility] -- 40% ostial D1, patent LAD stents, 10% RCA ISR  . LEFT HEART CATHETERIZATION WITH CORONARY ANGIOGRAM   03/2012   ~30% ISR RCA, patent LAD stent & D2; EDP ~28 mmHg  . NM MYOVIEW LTD  12/2016   LOW RISK study. No ischemia or infarction. EF 65-75%.  Marland Kitchen PANCREAS SURGERY     "stint in my pancreas"; Dr Pershing Proud  . PERCUTANEOUS PINNING PHALANX  FRACTURE OF HAND Left ~ 2013  . PERIPHERAL VASCULAR INTERVENTION  01/14/2017   Procedure: Peripheral Vascular Intervention;  Surgeon: Serafina Mitchell, MD;  Location: [facility];  Service: Cardiovascular;;  mesentric  . RENAL ARTERY STENT Left 08/2012   @ [facility] - 6.0 mm x 15 mm  . SPINE SURGERY     tumor removed 07/2010; Redo Surgery 04/2012; Sacroiliac Sgx 08/2013  . TOTAL ABDOMINAL HYSTERECTOMY    . TOTAL KNEE ARTHROPLASTY Left ~ 2012  . VISCERAL ANGIOGRAM N/A 05/12/2014   Procedure: VISCERAL ANGIOGRAM;  Surgeon: Lorretta Harp, MD;  Location: [facility] CATH LAB;  Service: Cardiovascular;  Laterality: N/A;  . VISCERAL ANGIOGRAPHY N/A 01/14/2017   Procedure: Mesenteric  Angiography;  Surgeon: Serafina Mitchell, MD;  Location: [place] [facility];  Service: Cardiovascular;  Laterality: N/A;    Allergies  Allergen Reactions  . Codeine Phosphate Anaphylaxis, Shortness Of Breath and Swelling  . Contrast Media [Iodinated Diagnostic Agents]     swelling  . Iodine Swelling and Rash    REACTION: unspecified This allergy is to Topical iodine only.   . Prednisone Itching and Rash    Keeps pt up all night   . Statins Other (See Comments)    Intolerant   . Tramadol Other (See Comments)    Kept patient awake    Current Outpatient Medications  Medication Sig Dispense Refill  . acetaminophen (TYLENOL) 500 MG tablet Take 2,000 mg by mouth 4 (four) times daily as needed for moderate pain.     Marland Kitchen amitriptyline (ELAVIL) 25 MG tablet TAKE 1 TABLET(25 MG) BY MOUTH AT BEDTIME (Patient taking differently: TAKE 1 TABLET(25 MG) BY MOUTH AT BEDTIME AS NEEDED FOR SLEEP) 30 tablet 3  . carvedilol (COREG) 6.25 MG tablet Take 6.25 mg by mouth daily.     . clopidogrel (PLAVIX) 75 MG tablet TAKE 1 TABLET(75 MG) BY MOUTH DAILY 30 tablet 5  . lidocaine (XYLOCAINE) 5 % ointment Apply 1 application topically at bedtime as needed (toe pain).     Marland Kitchen  losartan (COZAAR) 50 MG tablet TAKE 1 TABLET(50 MG) BY  MOUTH DAILY 90 tablet 2  . metolazone (ZAROXOLYN) 2.5 MG tablet Take 1 to 2 tablets  by mouth 30-60 minutes prior to morning dose of lasix AS NEEDED (Patient taking differently: Take 2.5 mg by mouth daily. Before lasix) 60 tablet 6  . nitroGLYCERIN (NITROSTAT) 0.4 MG SL tablet DISSOLVE 1 TABLET UNDER THE TONGUE EVERY 5 MINUTES AS NEEDED FOR CHEST PAIN 25 tablet 4  . ondansetron (ZOFRAN ODT) 4 MG disintegrating tablet Dissolve 1 tablet on the tongue every 6 hours. (Patient not taking: Reported on 09/04/2017) 56 tablet 1  . pantoprazole (PROTONIX) 40 MG tablet Take 1 tablet (40 mg total) by mouth daily. Will need further refills from your new gi physician (Patient not taking: Reported on 09/04/2017) 30 tablet 0  . spironolactone (ALDACTONE) 25 MG tablet Take 1 tablet (25 mg total) by mouth every Monday, Wednesday, and Friday at 6 PM. 45 tablet 3  . Artificial Tear Solution (TEARS RENEWED OP) Apply 1 drop to eye daily as needed (dry eyes).    . furosemide (LASIX) 80 MG tablet Take 1 tablet (80 mg total) by mouth 2 (two) times daily. (Patient taking differently: Take 80 mg by mouth See admin instructions. Take 80 mg twice daily on Tues, Thurs, Sat, and Sun. Take 80 mg once daily in the morning on Mon, Wed, and Fri) 180 tablet 3  . Lidocaine 4 % PTCH Apply 1 patch topically daily as needed (pain).    . potassium chloride SA (K-DUR,KLOR-CON) 20 MEQ tablet Take 20 mEq by mouth daily.     Current Facility-Administered Medications  Medication Dose Route Frequency Provider Last Rate Last Dose  . fentaNYL (SUBLIMAZE) injection 25 mcg  25 mcg Intravenous Q1H PRN Serafina Mitchell, MD        ROS: see HPI for pertinent positives and negatives    Physical Examination  Vitals:   09/04/17 0914  BP: (!) 140/57  Pulse: 87  Resp: 16  Temp: 97.7 F (36.5 C)  TempSrc: Oral  SpO2: 96%  Weight: 190 lb (86.2 kg)  Height: 5\' 8"  (1.727 m)   Body mass index is 28.89 kg/m.  General: A&O x 3, WDWN, overweight  female.  Pulmonary: Sym exp, respirations are non labored, good air movt, CTAB, no rales, rhonchi, or wheezing.  Cardiac: RRR, Nl S1, S2, no detected murmur.  Vascular: Vessel Right Left  Radial 1+Palpable 1+Palpable  Carotid  without bruit  without bruit  Aorta Not palpable N/A  Femoral 2+Palpable 2+Palpable  Popliteal Not palpable Not palpable  PT 2+Palpable 2+Palpable  DP Not Palpable Not Palpable   Gastrointestinal: soft, ND, mildly tender to palpation in all quadrants, -G/R, - HSM, - palpable masses, - CVAT.  Musculoskeletal: M/S 5/5 throughout, Extremities without ischemic changes.  Skin: No rash, no cellulitis, no ulcers noted.   Neurologic: Pain and light touch intact in extremities, Motor exam as listed above. CN 2-12 intact.      DATA  Mesenteric Duplex (Date: 09/04/2017):   Ao: 159 cm/s PSV  Celiac artery: >400 cm/s PSV  SMA:        Proximal: 283 cm/s PSV       Mid: 183 cm/s PSV  IMA: not visualized Celiac artery stent not clearly identified. >70% stenosis involving the celiac artery and proximal SMA.    February 2018 Renal artery duplex requested by Dr. Johnsie Cancel: Technically challenging study. Normal caliber abdominal aorta. >50% distal aorta stenosis. >70% celiac  and SMA stenosis. Normal bilateral kidney size. Normal bilateral renal arteries, s/p left renal artery stent. The IVC and renal veins are patent.   Medical Decision Making  [name] is a 77 y.o. female who is s/p celiac artery stenting on 01/14/2017. She continued to have abdominal pain after this. She has a 2 month hx of vomiting and diarrhea.   05-27-17 serum creatinine was 0.93  She weighed 226 # on 03-03-17, 190# today, lost 36 pounds in 6 months.   She takes Plavix, no ASA. She is statin intolerant.     Based on her exam and studies, and after discussing with Dr. Bridgett Larsson, will schedule for visceral angiography, possible intervention by Dr. Trula Slade    I discussed in depth  with the patient the nature of atherosclerosis, and emphasized the importance of maximal medical management including strict control of blood pressure, blood glucose, and lipid levels, obtaining regular exercise, and cessation of smoking.    The patient is aware that without maximal medical management the underlying atherosclerotic disease process will progress, limiting the benefit of any interventions.  Thank you for allowing [place] to participate in this patient's care.  Clemon Chambers, RN, MSN, FNP-C [facility] Office: [phone]  Clinic MD: Early/Chen  09/04/2017, 7:40 PM

## 2017-09-04 NOTE — Patient Instructions (Addendum)
Chronic Mesenteric Ischemia Mesenteric ischemia is poor blood flow (circulation) in the vessels that supply blood to the stomach, intestines, and liver (mesenteric organs). Chronic mesenteric ischemia, also called mesenteric angina or intestinal angina, is a long-term (chronic) condition. It happens when an artery or vein that provides blood to the mesenteric organs gradually becomes blocked or narrow, restricting the blood supply to the organs. When the blood supply is severely restricted, the mesenteric organs cannot work properly. What are the causes? This condition is commonly caused by fatty deposits that build up in an artery (plaque), which can narrow the artery and restrict blood flow. Other causes include:  Weakened areas in blood vessel walls (aneurysms).  Conditions that cause twisting or inflammation of blood vessels, such as fibromuscular dysplasia or arteritis.  A disorder in which blood clots form in the veins (venous thrombosis).  Scarring and thickening (fibrosis) of blood vessels caused by radiation therapy.  A tear in the aorta, the body's main artery (aortic dissection).  Blood vessel problems after illegal drug use, such as use of cocaine.  Tumors in the nervous system (neurofibromatosis).  Certain autoimmune diseases, such as lupus.  What increases the risk? The following factors may make you more likely to develop this condition:  Being female.  Being over age 50, especially if you have a history of heart problems.  Smoking.  Congestive heart failure.  Irregular heartbeat (arrhythmia).  Having a history of heart attack or stroke.  Diabetes.  High cholesterol.  High blood pressure (hypertension).  Being overweight or obese.  Kidney disease (renal disease) requiring dialysis.  What are the signs or symptoms? Symptoms of this condition include:  Abdomen (abdominal) pain or cramps that develop 15-60 minutes after a meal. This pain may last for 1-3  hours. Some people may develop a fear of eating because of this symptom.  Weight loss.  Diarrhea.  Bloody stool.  Nausea.  Vomiting.  Bloating.  Abdominal pain after stress or with exercise.  How is this diagnosed? This condition is diagnosed based on:  Your medical history.  A physical exam.  Tests, such as: ? Ultrasound. ? CT scan. ? Blood tests. ? Urine tests. ? An imaging test that involves injecting a dye into your arteries to show blood flow through blood vessels (angiogram). This can help to show if there are any blockages in the vessels that lead to the intestines. ? Passing a small probe through the mouth and into the stomach to measure the output of carbon dioxide (gastric tonometry). This can help to indicate whether there is decreased blood flow to the stomach and intestines.  How is this treated? This condition may be treated with:  Dietary changes such as eating smaller, low-fat, meals more frequently.  Lifestyle changes to treat underlying conditions that contribute to the disease, such as high cholesterol and high blood pressure.  Medicines to reduce blood clotting and increase blood flow.  Surgery to remove the blockage, repair arteries or veins, and restore blood flow. This may involve: ? Angioplasty. This is surgery to widen the affected artery, reduce the blockage, and sometimes insert a small, mesh tube (stent). ? Bypass surgery. This may be done to go around (bypass) the blockage and reconnect healthy arteries or veins. ? Placing a stent in the affected area. This may be done to help keep blocked arteries open.  Follow these instructions at home: Eating and drinking  Eat a heart-healthy diet. This includes fresh fruits and vegetables, whole grains, and lean proteins   like chicken, fish, eggs, and beans.  Avoid foods that contain a lot of: ? Salt (sodium). ? Sugar. ? Saturated fat (such as red meat). ? Trans fat (such as fried foods).  Stay  hydrated. Drink enough fluid to keep your urine clear or pale yellow. Lifestyle  Stay active and get regular exercise as told by your health care provider. Aim for 150 minutes of moderate activity or 75 minutes of vigorous activity a week. Ask your health care provider what activities and forms of exercise are safe for you.  Maintain a healthy weight.  Work with your health care provider to manage your cholesterol.  Manage any other health problems you have, such as high blood pressure, diabetes, or heart rhythm problems.  Do not use any products that contain nicotine or tobacco, such as cigarettes and e-cigarettes. If you need help quitting, ask your health care provider. General instructions  Take over-the-counter and prescription medicines only as told by your health care provider.  Keep all follow-up visits as told by your health care provider. This is important. Contact a health care provider if:  Your symptoms do not improve or they return after treatment.  You have a fever. Get help right away if:  You have severe abdominal pain.  You have severe chest pain.  You have shortness of breath.  You feel weak or dizzy.  You have palpitations.  You have numbness or weakness in your face, arm, or leg.  You are confused.  You have trouble speaking or people have trouble understanding what you are saying.  You are constipated.  You have trouble urinating.  You have blood in your stool.  You have severe nausea, vomiting, or persistent diarrhea. Summary  Mesenteric ischemia is poor circulation in the vessels that supply blood to the the stomach, intestines, and liver (mesenteric organs).  This condition happens when an artery or vein that provides blood to the mesenteric organs gradually becomes blocked or narrow, restricting the blood supply to the organs.  This condition is commonly caused by fatty deposits that build up in an artery (plaque), which can narrow the  artery and restrict blood flow.  You are more likely to develop this condition if you are over age 39 and have a history of heart problems, high blood pressure, diabetes, or high cholesterol.  This condition is usually treated with medicines, dietary and lifestyle changes, and surgery to remove the blockage, repair arteries or veins, and restore blood flow. This information is not intended to replace advice given to you by your health care provider. Make sure you discuss any questions you have with your health care provider. Document Released: 04/08/2011 Document Revised: 08/03/2016 Document Reviewed: 08/03/2016 [employer] Interactive Patient Education  2017 [employer]     To measure for knee high compression hose: Measure the length of calf (from the crease of the knee to the bottom of the heel), largest circumference of calf, and ankle circumference first thing in the morning before your legs have a chance to swell.  Take these 3 measurements with you to obtain 20-30 mm mercury graduated knee high compression hose.  Put the stockings on in the morning, remove at bedtime.     Chronic Venous Insufficiency Chronic venous insufficiency, also called venous stasis, is a condition that prevents blood from being pumped effectively through the veins in your legs. Blood may no longer be pumped effectively from the legs back to the heart. This condition can range from mild to severe. With  proper treatment, you should be able to continue with an active life. What are the causes? Chronic venous insufficiency occurs when the vein walls become stretched, weakened, or damaged, or when valves within the vein are damaged. Some common causes of this include:  High blood pressure inside the veins (venous hypertension).  Increased blood pressure in the leg veins from long periods of sitting or standing.  A blood clot that blocks blood flow in a vein (deep vein thrombosis, DVT).  Inflammation of a vein  (phlebitis) that causes a blood clot to form.  Tumors in the pelvis that cause blood to back up.  What increases the risk? The following factors may make you more likely to develop this condition:  Having a family history of this condition.  Obesity.  Pregnancy.  Living without enough physical activity or exercise (sedentary lifestyle).  Smoking.  Having a job that requires long periods of standing or sitting in one place.  Being a certain age. Women in their 84s and 19s and men in their 62s are more likely to develop this condition.  What are the signs or symptoms? Symptoms of this condition include:  Veins that are enlarged, bulging, or twisted (varicose veins).  Skin breakdown or ulcers.  Reddened or discolored skin on the front of the leg.  Brown, smooth, tight, and painful skin just above the ankle, usually on the inside of the leg (lipodermatosclerosis).  Swelling.  How is this diagnosed? This condition may be diagnosed based on:  Your medical history.  A physical exam.  Tests, such as: ? A procedure that creates an image of a blood vessel and nearby organs and provides information about blood flow through the blood vessel (duplex ultrasound). ? A procedure that tests blood flow (plethysmography). ? A procedure to look at the veins using X-ray and dye (venogram).  How is this treated? The goals of treatment are to help you return to an active life and to minimize pain or disability. Treatment depends on the severity of your condition, and it may include:  Wearing compression stockings. These can help relieve symptoms and help prevent your condition from getting worse. However, they do not cure the condition.  Sclerotherapy. This is a procedure involving an injection of a material that "dissolves" damaged veins.  Surgery. This may involve: ? Removing a diseased vein (vein stripping). ? Cutting off blood flow through the vein (laser ablation  surgery). ? Repairing a valve.  Follow these instructions at home:  Wear compression stockings as told by your health care provider. These stockings help to prevent blood clots and reduce swelling in your legs.  Take over-the-counter and prescription medicines only as told by your health care provider.  Stay active by exercising, walking, or doing different activities. Ask your health care provider what activities are safe for you and how much exercise you need.  Drink enough fluid to keep your urine clear or pale yellow.  Do not use any products that contain nicotine or tobacco, such as cigarettes and e-cigarettes. If you need help quitting, ask your health care provider.  Keep all follow-up visits as told by your health care provider. This is important. Contact a health care provider if:  You have redness, swelling, or more pain in the affected area.  You see a red streak or line that extends up or down from the affected area.  You have skin breakdown or a loss of skin in the affected area, even if the breakdown is small.  You get an injury in the affected area. Get help right away if:  You get an injury and an open wound in the affected area.  You have severe pain that does not get better with medicine.  You have sudden numbness or weakness in the foot or ankle below the affected area, or you have trouble moving your foot or ankle.  You have a fever and you have worse or persistent symptoms.  You have chest pain.  You have shortness of breath. Summary  Chronic venous insufficiency, also called venous stasis, is a condition that prevents blood from being pumped effectively through the veins in your legs.  Chronic venous insufficiency occurs when the vein walls become stretched, weakened, or damaged, or when valves within the vein are damaged.  Treatment for this condition depends on how severe your condition is, and it may involve wearing compression stockings or having  a procedure.  Make sure you stay active by exercising, walking, or doing different activities. Ask your health care provider what activities are safe for you and how much exercise you need. This information is not intended to replace advice given to you by your health care provider. Make sure you discuss any questions you have with your health care provider. Document Released: 12/23/2006 Document Revised: 07/08/2016 Document Reviewed: 07/08/2016 [employer] Interactive Patient Education  2017 [employer].

## 2017-09-04 NOTE — H&P (View-Only) (Signed)
Follow up Celiac artery stenosis s/p stent placement  History of Present Illness  [name] is a 77 y.o. (June 22, 1941) female who is s/p celiac artery stenting on 01/14/2017 by Dr. Trula Slade using a 7 x 19 Omnilink stent for a 90% stenosis.  Unfortunately, the patient has not had any change in her symptoms since the stent placement.  The patient has had chronic abdominal pain.  She had exhausted all medical options to relieve her pain.  The only thing remaining was her celiac stenosis.She has a long-standing history of abdominal pain. She underwent cholecystectomy at age 69 she is been diagnosed with pancreatic divisum and has had stents placed 20 years ago at [facility]. She continues to have abdominal pain. She has undergone multiple trials which have been unsuccessful with resolving her pain.   She has had diarrhea and vomiting for the last 2 months. She feels slightly better recently. She has severe bilateral upper quadrant abdominal pain and bilateral flank pain after eating, but she has the same type pain other times that is not post prandial.   Dr. Trula Slade last evaluated pt on 03-03-17. At that time mesenteric ultrasound: Unable to visualize celiac artery. Celiac stenting: Unfortunately, the patient did not have any relief of her abdominal pain after stenting.  She will now be on protocol for follow-up. Leg swelling: Venous reflux examination was essentially normal. Dr. Trula Slade counseled the patient to wear compression stockings and return in 6 months for possible evaluation of a Normatec device.   Pt does not need a Normatec device; her lower legs swelling has mostly subsided since her significant unintentional weight loss.  She is taking metolazone and Lasix for this.   She had a left renal artery stent placed by a cardiologist in [facility], and Dr. Ellyn Hack has been checking this with ultrasounds.    Diabetic: states since her weight loss her A1C is below DM threshold Tobacco use:  never  She takes Plavix, no ASA. She is statin intolerant.   Past Medical History:  Diagnosis Date  . Arthritis    "middle finger right hand" (01/14/2017)  . Atrial fibrillation ([place])   . Back pain with radiation    Chronic Back Pain - mutliple surgeries (including tumor removal)  . Bilateral edema of lower extremity    Chronic, likely related to venous stasis  . CAD S/P percutaneous coronary angioplasty    a) LHC: 07/30/10. -- 3.0x78mm Integrity BMS to pRCA & 2.5x 79mm Integrity BMS mLAD(@ D2).  b) Class III-IV Angina 03/2011: LHC- ISR in LAD BMS -- prox overlapping Promus DES 2.5x68mm and PTCA of jailed D2 ostium-prox 80%. c) 02/03/12:  LHC- patent stents.  Jailed diagonal. with stable flow; d) Peri-OP NSTEMI 04/2012 - LHC in 12/'13 -   . Celiac artery stenosis ([place])    12/2016 - STENT placement  . Complication of anesthesia    "used to wake up wild" (01/14/2017)  . Congestive heart failure with LV diastolic dysfunction, NYHA class 2 ([place])    06/13/10:  last 2D echo-  EF >55%, Mild TR, Mod Conc LVH - Grade 1 diastolic dysfunction (abnormal relaxation) --> LVEDP on Cath 28 mmHg & mild 2nd Pulm HTN  . Diverticulitis of colon (without mention of hemorrhage)(562.11)   . Dyslipidemia, goal LDL below 70    Intolerant to statins  . GERD (gastroesophageal reflux disease)   . Hepatitis ~ 1957   "yellow jaundice" (01/14/2017)  . Hepatomegaly   . Hiatal hernia   .  History of blood transfusion    "when I had a heart attack"  . History of kidney stones   . History of stomach ulcers "years ago"  . Irritable bowel syndrome (IBS)   . Labile essential hypertension    Partially related to RAS  . Mesenteric artery stenosis (H[facility])    95% Celiac Artery - ostial, 20-30% SMA.  Bilateral Renal A: L RA stent patent, R RA 20-30% -- Conservative Management  . NSTEMI (non-ST elevated myocardial infarction) ([place]) 04/2012   Unclear the details, apparently this was postoperative from her back surgery that she was  cleared for my last saw her in June.  Reportedly had stents placed  . Pancreas divisum    On pancreatic enzyme  . Renal artery stenosis ([place]) 2011; 12/'13   a) Angiogram 02/03/12:  50-60%L RA stenosis, 40% R R Inferior artery; b) 12/'13: S/P L RA Stent ([facility]) 6.0 mm x 15 mm; c) Renal Duplex 10/2013: <60% L RA, <60 R RA, ~60% SMA & Celiac A.  . Stricture and stenosis of esophagus   . Type II diabetes mellitus ([place])    "borderline; not on RX" (01/14/2017)  . Unspecified disorder of thyroid   . Unspecified gastritis and gastroduodenitis without mention of hemorrhage     Social History Social History   Tobacco Use  . Smoking status: Never Smoker  . Smokeless tobacco: Never Used  Substance Use Topics  . Alcohol use: No  . Drug use: No    Family History Family History  Problem Relation Age of Onset  . Cancer Father        mets  . Heart attack Father   . Heart disease Father   . Stroke Mother   . Heart attack Brother   . Heart attack Brother   . Colitis Maternal Grandfather   . Diabetes Brother   . Stroke Brother   . Heart attack Brother   . Colon cancer Neg Hx   . Stomach cancer Neg Hx     Surgical History Past Surgical History:  Procedure Laterality Date  . APPENDECTOMY    . BACK SURGERY    . BREAST DUCTAL SYSTEM EXCISION     right  . BREAST SURGERY Right   . CARDIAC CATHETERIZATION N/A 03/16/2015   Procedure: Left Heart Cath and Coronary Angiography;  Surgeon: Leonie Man, MD;  Location: [place] [facility];  Service: Cardiovascular; Widely patent m-dLAD stents as wellas pRCA stent.  High LVEDP, small Diag & Om vessels with moderate stenosis   . Cardiac Event Monitor  01/2017   Mostly NSR with occasional sinus tachycardia and rare bradycardia. No A. fib. No PND or PSVT. Rare PACs and PVCs.  Marland Kitchen CARDIOVASCULAR STRESS TEST     11/07/11:  Normal perfusion pattern.  EF 66% No wall motion abnormalities.   Marland Kitchen CATARACT EXTRACTION W/ INTRAOCULAR LENS  IMPLANT, BILATERAL  Bilateral   . CHOLECYSTECTOMY OPEN    . COLONOSCOPY  03/01/2009   normal   . CORONARY ANGIOPLASTY WITH STENT PLACEMENT  07/2010, 02/2011   LHC: 07/30/10.  3.0x58mm Integrity BMS to RCA and 2.5x 25mm Integrity BMS LAD.  03/2011: LHC- ISRS in LAD treated with overlapping Promus DES 2.5x56mm and PTCA of Diagonal.  02/03/12:  LHC- patent stents.  Jailed diagonal.  . DOBUTAMINE STRESS ECHO  03/07/2015   [facility] (ordered for pre-op evaluation for EUS/ERCP --> abnormal EKG:  strreesss test shhoowwed 1 mm ST segment depressions downsloping. No wall motion abnormalities at peak  exercise or at rest. Diastolic dysfunction was noted but normal systolic function - EF greater than 55%. No bouts of regurgitation or stenosis. Resting hypertension with exaggerated response  . ESOPHAGOGASTRODUODENOSCOPY  04/08/2012  . FOOT SURGERY Bilateral    "took bone out of 2nd toe on each foot"  . HIP SURGERY     left  . JOINT REPLACEMENT    . KNEE SURGERY Left    "had fluid drained off it a couple times"  . LEFT HEART CATHETERIZATION WITH CORONARY ANGIOGRAM N/A 02/03/2012   Procedure: LEFT HEART CATHETERIZATION WITH CORONARY ANGIOGRAM;  Surgeon: Leonie Man, MD;  [facility];  Service: Cardiovascular;  Laterality: N/A;  . LEFT HEART CATHETERIZATION WITH CORONARY ANGIOGRAM N/A 05/12/2014   Procedure: LEFT HEART CATHETERIZATION WITH CORONARY ANGIOGRAM;  Surgeon: Lorretta Harp, MD;  Location: [facility] CATH LAB;  Service: Cardiovascular: Stable CAD. Patent stents. Patent renal artery stent  . LEFT HEART CATHETERIZATION WITH CORONARY ANGIOGRAM  08/2012   Peri-Op MI @ [facility] -- 40% ostial D1, patent LAD stents, 10% RCA ISR  . LEFT HEART CATHETERIZATION WITH CORONARY ANGIOGRAM   03/2012   ~30% ISR RCA, patent LAD stent & D2; EDP ~28 mmHg  . NM MYOVIEW LTD  12/2016   LOW RISK study. No ischemia or infarction. EF 65-75%.  Marland Kitchen PANCREAS SURGERY     "stint in my pancreas"; Dr Pershing Proud  . PERCUTANEOUS PINNING PHALANX  FRACTURE OF HAND Left ~ 2013  . PERIPHERAL VASCULAR INTERVENTION  01/14/2017   Procedure: Peripheral Vascular Intervention;  Surgeon: Serafina Mitchell, MD;  Location: [place] [facility];  Service: Cardiovascular;;  mesentric  . RENAL ARTERY STENT Left 08/2012   @ [facility] - 6.0 mm x 15 mm  . SPINE SURGERY     tumor removed 07/2010; Redo Surgery 04/2012; Sacroiliac Sgx 08/2013  . TOTAL ABDOMINAL HYSTERECTOMY    . TOTAL KNEE ARTHROPLASTY Left ~ 2012  . VISCERAL ANGIOGRAM N/A 05/12/2014   Procedure: VISCERAL ANGIOGRAM;  Surgeon: Lorretta Harp, MD;  Location: [facility] CATH LAB;  Service: Cardiovascular;  Laterality: N/A;  . VISCERAL ANGIOGRAPHY N/A 01/14/2017   Procedure: Mesenteric  Angiography;  Surgeon: Serafina Mitchell, MD;  Location: [place] [facility];  Service: Cardiovascular;  Laterality: N/A;    Allergies  Allergen Reactions  . Codeine Phosphate Anaphylaxis, Shortness Of Breath and Swelling  . Contrast Media [Iodinated Diagnostic Agents]     swelling  . Iodine Swelling and Rash    REACTION: unspecified This allergy is to Topical iodine only.   . Prednisone Itching and Rash    Keeps pt up all night   . Statins Other (See Comments)    Intolerant   . Tramadol Other (See Comments)    Kept patient awake    Current Outpatient Medications  Medication Sig Dispense Refill  . acetaminophen (TYLENOL) 500 MG tablet Take 2,000 mg by mouth 4 (four) times daily as needed for moderate pain.     Marland Kitchen amitriptyline (ELAVIL) 25 MG tablet TAKE 1 TABLET(25 MG) BY MOUTH AT BEDTIME (Patient taking differently: TAKE 1 TABLET(25 MG) BY MOUTH AT BEDTIME AS NEEDED FOR SLEEP) 30 tablet 3  . carvedilol (COREG) 6.25 MG tablet Take 6.25 mg by mouth daily.     . clopidogrel (PLAVIX) 75 MG tablet TAKE 1 TABLET(75 MG) BY MOUTH DAILY 30 tablet 5  . lidocaine (XYLOCAINE) 5 % ointment Apply 1 application topically at bedtime as needed (toe pain).     Marland Kitchen  losartan (COZAAR) 50 MG tablet TAKE 1 TABLET(50 MG) BY  MOUTH DAILY 90 tablet 2  . metolazone (ZAROXOLYN) 2.5 MG tablet Take 1 to 2 tablets  by mouth 30-60 minutes prior to morning dose of lasix AS NEEDED (Patient taking differently: Take 2.5 mg by mouth daily. Before lasix) 60 tablet 6  . nitroGLYCERIN (NITROSTAT) 0.4 MG SL tablet DISSOLVE 1 TABLET UNDER THE TONGUE EVERY 5 MINUTES AS NEEDED FOR CHEST PAIN 25 tablet 4  . ondansetron (ZOFRAN ODT) 4 MG disintegrating tablet Dissolve 1 tablet on the tongue every 6 hours. (Patient not taking: Reported on 09/04/2017) 56 tablet 1  . pantoprazole (PROTONIX) 40 MG tablet Take 1 tablet (40 mg total) by mouth daily. Will need further refills from your new gi physician (Patient not taking: Reported on 09/04/2017) 30 tablet 0  . spironolactone (ALDACTONE) 25 MG tablet Take 1 tablet (25 mg total) by mouth every Monday, Wednesday, and Friday at 6 PM. 45 tablet 3  . Artificial Tear Solution (TEARS RENEWED OP) Apply 1 drop to eye daily as needed (dry eyes).    . furosemide (LASIX) 80 MG tablet Take 1 tablet (80 mg total) by mouth 2 (two) times daily. (Patient taking differently: Take 80 mg by mouth See admin instructions. Take 80 mg twice daily on Tues, Thurs, Sat, and Sun. Take 80 mg once daily in the morning on Mon, Wed, and Fri) 180 tablet 3  . Lidocaine 4 % PTCH Apply 1 patch topically daily as needed (pain).    . potassium chloride SA (K-DUR,KLOR-CON) 20 MEQ tablet Take 20 mEq by mouth daily.     Current Facility-Administered Medications  Medication Dose Route Frequency Provider Last Rate Last Dose  . fentaNYL (SUBLIMAZE) injection 25 mcg  25 mcg Intravenous Q1H PRN Serafina Mitchell, MD        ROS: see HPI for pertinent positives and negatives    Physical Examination  Vitals:   09/04/17 0914  BP: (!) 140/57  Pulse: 87  Resp: 16  Temp: 97.7 F (36.5 C)  TempSrc: Oral  SpO2: 96%  Weight: 190 lb (86.2 kg)  Height: 5\' 8"  (1.727 m)   Body mass index is 28.89 kg/m.  General: A&O x 3, WDWN, overweight  female.  Pulmonary: Sym exp, respirations are non labored, good air movt, CTAB, no rales, rhonchi, or wheezing.  Cardiac: RRR, Nl S1, S2, no detected murmur.  Vascular: Vessel Right Left  Radial 1+Palpable 1+Palpable  Carotid  without bruit  without bruit  Aorta Not palpable N/A  Femoral 2+Palpable 2+Palpable  Popliteal Not palpable Not palpable  PT 2+Palpable 2+Palpable  DP Not Palpable Not Palpable   Gastrointestinal: soft, ND, mildly tender to palpation in all quadrants, -G/R, - HSM, - palpable masses, - CVAT.  Musculoskeletal: M/S 5/5 throughout, Extremities without ischemic changes.  Skin: No rash, no cellulitis, no ulcers noted.   Neurologic: Pain and light touch intact in extremities, Motor exam as listed above. CN 2-12 intact.      DATA  Mesenteric Duplex (Date: 09/04/2017):   Ao: 159 cm/s PSV  Celiac artery: >400 cm/s PSV  SMA:        Proximal: 283 cm/s PSV       Mid: 183 cm/s PSV  IMA: not visualized Celiac artery stent not clearly identified. >70% stenosis involving the celiac artery and proximal SMA.    February 2018 Renal artery duplex requested by Dr. Johnsie Cancel: Technically challenging study. Normal caliber abdominal aorta. >50% distal aorta stenosis. >70% celiac  and SMA stenosis. Normal bilateral kidney size. Normal bilateral renal arteries, s/p left renal artery stent. The IVC and renal veins are patent.   Medical Decision Making  [name] is a 77 y.o. female who is s/p celiac artery stenting on 01/14/2017. She continued to have abdominal pain after this. She has a 2 month hx of vomiting and diarrhea.   05-27-17 serum creatinine was 0.93  She weighed 226 # on 03-03-17, 190# today, lost 36 pounds in 6 months.   She takes Plavix, no ASA. She is statin intolerant.     Based on her exam and studies, and after discussing with Dr. Bridgett Larsson, will schedule for visceral angiography, possible intervention by Dr. Trula Slade    I discussed in depth  with the patient the nature of atherosclerosis, and emphasized the importance of maximal medical management including strict control of blood pressure, blood glucose, and lipid levels, obtaining regular exercise, and cessation of smoking.    The patient is aware that without maximal medical management the underlying atherosclerotic disease process will progress, limiting the benefit of any interventions.  Thank you for allowing [place] to participate in this patient's care.  Clemon Chambers, RN, MSN, FNP-C [facility] Office: [phone]  Clinic MD: Early/Chen  09/04/2017, 7:40 PM

## 2017-09-09 ENCOUNTER — Encounter (HOSPITAL_COMMUNITY): Payer: Self-pay | Admitting: Surgery

## 2017-09-09 ENCOUNTER — Encounter (HOSPITAL_COMMUNITY)
Admission: RE | Disposition: A | Discharge status: Home / Self Care | Payer: Self-pay | Source: Ambulatory Visit | Attending: Surgery

## 2017-09-09 ENCOUNTER — Observation Stay (HOSPITAL_COMMUNITY)
Admission: RE | Admit: 2017-09-09 | Discharge: 2017-09-10 | Disposition: A | Discharge status: Home / Self Care | Payer: Medicare Other | Source: Ambulatory Visit | Attending: Surgery | Admitting: Surgery

## 2017-09-09 ENCOUNTER — Other Ambulatory Visit: Payer: Self-pay

## 2017-09-09 DIAGNOSIS — I2510 Atherosclerotic heart disease of native coronary artery without angina pectoris: Secondary | ICD-10-CM | POA: Insufficient documentation

## 2017-09-09 DIAGNOSIS — I739 Peripheral vascular disease, unspecified: Secondary | ICD-10-CM | POA: Diagnosis present

## 2017-09-09 DIAGNOSIS — Z9049 Acquired absence of other specified parts of digestive tract: Secondary | ICD-10-CM | POA: Diagnosis not present

## 2017-09-09 DIAGNOSIS — I509 Heart failure, unspecified: Secondary | ICD-10-CM | POA: Diagnosis not present

## 2017-09-09 DIAGNOSIS — I4891 Unspecified atrial fibrillation: Secondary | ICD-10-CM | POA: Diagnosis not present

## 2017-09-09 DIAGNOSIS — Z79899 Other long term (current) drug therapy: Secondary | ICD-10-CM | POA: Diagnosis not present

## 2017-09-09 DIAGNOSIS — Z96652 Presence of left artificial knee joint: Secondary | ICD-10-CM | POA: Diagnosis not present

## 2017-09-09 DIAGNOSIS — Y831 Surgical operation with implant of artificial internal device as the cause of abnormal reaction of the patient, or of later complication, without mention of misadventure at the time of the procedure: Secondary | ICD-10-CM | POA: Diagnosis not present

## 2017-09-09 DIAGNOSIS — Z8249 Family history of ischemic heart disease and other diseases of the circulatory system: Secondary | ICD-10-CM | POA: Diagnosis not present

## 2017-09-09 DIAGNOSIS — Z7902 Long term (current) use of antithrombotics/antiplatelets: Secondary | ICD-10-CM | POA: Insufficient documentation

## 2017-09-09 DIAGNOSIS — K219 Gastro-esophageal reflux disease without esophagitis: Secondary | ICD-10-CM | POA: Diagnosis not present

## 2017-09-09 DIAGNOSIS — R7303 Prediabetes: Secondary | ICD-10-CM | POA: Insufficient documentation

## 2017-09-09 DIAGNOSIS — Z91041 Radiographic dye allergy status: Secondary | ICD-10-CM | POA: Insufficient documentation

## 2017-09-09 DIAGNOSIS — I252 Old myocardial infarction: Secondary | ICD-10-CM | POA: Insufficient documentation

## 2017-09-09 DIAGNOSIS — K551 Chronic vascular disorders of intestine: Secondary | ICD-10-CM | POA: Insufficient documentation

## 2017-09-09 DIAGNOSIS — T82858A Stenosis of vascular prosthetic devices, implants and grafts, initial encounter: Secondary | ICD-10-CM | POA: Diagnosis not present

## 2017-09-09 DIAGNOSIS — Z955 Presence of coronary angioplasty implant and graft: Secondary | ICD-10-CM | POA: Insufficient documentation

## 2017-09-09 HISTORY — PX: PERIPHERAL VASCULAR BALLOON ANGIOPLASTY: CATH118281

## 2017-09-09 HISTORY — PX: VISCERAL ANGIOGRAPHY: CATH118276

## 2017-09-09 HISTORY — DX: Type 2 diabetes mellitus without complications: E11.9

## 2017-09-09 LAB — COMPREHENSIVE METABOLIC PANEL
ALK PHOS: 65 U/L (ref 38–126)
ALT: 12 U/L — AB (ref 14–54)
ANION GAP: 11 (ref 5–15)
AST: 19 U/L (ref 15–41)
Albumin: 3.4 g/dL — ABNORMAL LOW (ref 3.5–5.0)
BUN: 21 mg/dL — ABNORMAL HIGH (ref 6–20)
CALCIUM: 8.8 mg/dL — AB (ref 8.9–10.3)
CHLORIDE: 98 mmol/L — AB (ref 101–111)
CO2: 26 mmol/L (ref 22–32)
CREATININE: 0.82 mg/dL (ref 0.44–1.00)
Glucose, Bld: 192 mg/dL — ABNORMAL HIGH (ref 65–99)
Potassium: 3.3 mmol/L — ABNORMAL LOW (ref 3.5–5.1)
Sodium: 135 mmol/L (ref 135–145)
Total Bilirubin: 0.8 mg/dL (ref 0.3–1.2)
Total Protein: 6.9 g/dL (ref 6.5–8.1)

## 2017-09-09 LAB — PROTIME-INR
INR: 1
PROTHROMBIN TIME: 13.1 s (ref 11.4–15.2)

## 2017-09-09 LAB — POCT ACTIVATED CLOTTING TIME
ACTIVATED CLOTTING TIME: 197 s
ACTIVATED CLOTTING TIME: 169 s
Activated Clotting Time: 235 seconds
Activated Clotting Time: 186 seconds

## 2017-09-09 LAB — POCT I-STAT, CHEM 8
BUN: 24 mg/dL — ABNORMAL HIGH (ref 6–20)
CALCIUM ION: 1.05 mmol/L — AB (ref 1.15–1.40)
CHLORIDE: 97 mmol/L — AB (ref 101–111)
Creatinine, Ser: 0.9 mg/dL (ref 0.44–1.00)
GLUCOSE: 114 mg/dL — AB (ref 65–99)
HCT: 34 % — ABNORMAL LOW (ref 36.0–46.0)
HEMOGLOBIN: 11.6 g/dL — AB (ref 12.0–15.0)
POTASSIUM: 3.3 mmol/L — AB (ref 3.5–5.1)
Sodium: 137 mmol/L (ref 135–145)
TCO2: 31 mmol/L (ref 22–32)

## 2017-09-09 SURGERY — VISCERAL ANGIOGRAPHY
Anesthesia: LOCAL

## 2017-09-09 MED ORDER — SODIUM CHLORIDE 0.9% FLUSH: 3.0000 mL | INTRAVENOUS | Status: DC | PRN

## 2017-09-09 MED ORDER — METHYLPREDNISOLONE SODIUM SUCC 125 MG IJ SOLR
INTRAMUSCULAR | Status: AC
  Administered 2017-09-09: 125 mg via INTRAVENOUS
  Filled 2017-09-09: qty 2

## 2017-09-09 MED ORDER — ACETAMINOPHEN 650 MG RE SUPP: 325.0000 mg | RECTAL | Status: DC | PRN

## 2017-09-09 MED ORDER — SODIUM CHLORIDE 0.9 % WEIGHT BASED INFUSION: 1.0000 mL/kg/h | INTRAVENOUS | Status: AC

## 2017-09-09 MED ORDER — FENTANYL CITRATE (PF) 100 MCG/2ML IJ SOLN: 25.0000 ug | INTRAMUSCULAR | Status: DC | PRN

## 2017-09-09 MED ORDER — FENTANYL CITRATE (PF) 100 MCG/2ML IJ SOLN
INTRAMUSCULAR | Status: AC
  Filled 2017-09-09: qty 2

## 2017-09-09 MED ORDER — HEPARIN (PORCINE) IN NACL 2-0.9 UNIT/ML-% IJ SOLN
INTRAMUSCULAR | Status: AC
  Filled 2017-09-09: qty 1000

## 2017-09-09 MED ORDER — LIDOCAINE HCL (PF) 1 % IJ SOLN
INTRAMUSCULAR | Status: AC
  Filled 2017-09-09: qty 30

## 2017-09-09 MED ORDER — ALUM & MAG HYDROXIDE-SIMETH 200-200-20 MG/5ML PO SUSP: 15.0000 mL | ORAL | Status: DC | PRN

## 2017-09-09 MED ORDER — PHENOL 1.4 % MT LIQD: 1.0000 | OROMUCOSAL | Status: DC | PRN

## 2017-09-09 MED ORDER — ONDANSETRON HCL 4 MG/2ML IJ SOLN: 4.0000 mg | Freq: Four times a day (QID) | INTRAMUSCULAR | Status: DC | PRN

## 2017-09-09 MED ORDER — LIDOCAINE HCL (PF) 1 % IJ SOLN
INTRAMUSCULAR | Status: DC | PRN
  Administered 2017-09-09: 15 mL

## 2017-09-09 MED ORDER — ACETAMINOPHEN 325 MG PO TABS
325.0000 mg | ORAL_TABLET | ORAL | Status: DC | PRN
  Administered 2017-09-10: 650 mg via ORAL
  Filled 2017-09-09: qty 2

## 2017-09-09 MED ORDER — DIPHENHYDRAMINE HCL 50 MG/ML IJ SOLN
25.0000 mg | INTRAMUSCULAR | Status: AC
  Administered 2017-09-09: 25 mg via INTRAVENOUS

## 2017-09-09 MED ORDER — SODIUM CHLORIDE 0.9 % IV SOLN
INTRAVENOUS | Status: DC
  Administered 2017-09-09: 11:00:00 via INTRAVENOUS

## 2017-09-09 MED ORDER — AMITRIPTYLINE HCL 25 MG PO TABS
25.0000 mg | ORAL_TABLET | Freq: Every day | ORAL | Status: DC
  Administered 2017-09-09: 25 mg via ORAL
  Filled 2017-09-09: qty 1

## 2017-09-09 MED ORDER — POTASSIUM CHLORIDE CRYS ER 20 MEQ PO TBCR
20.0000 meq | EXTENDED_RELEASE_TABLET | Freq: Once | ORAL | Status: AC
  Administered 2017-09-09: 20 meq via ORAL
  Filled 2017-09-09: qty 1

## 2017-09-09 MED ORDER — METOPROLOL TARTRATE 5 MG/5ML IV SOLN: 2.0000 mg | INTRAVENOUS | Status: DC | PRN

## 2017-09-09 MED ORDER — GUAIFENESIN-DM 100-10 MG/5ML PO SYRP: 15.0000 mL | ORAL_SOLUTION | ORAL | Status: DC | PRN

## 2017-09-09 MED ORDER — HYDRALAZINE HCL 20 MG/ML IJ SOLN: 5.0000 mg | INTRAMUSCULAR | Status: DC | PRN

## 2017-09-09 MED ORDER — HEPARIN SODIUM (PORCINE) 1000 UNIT/ML IJ SOLN
INTRAMUSCULAR | Status: DC | PRN
  Administered 2017-09-09: 8000 [IU] via INTRAVENOUS

## 2017-09-09 MED ORDER — NITROGLYCERIN 0.4 MG SL SUBL: 0.4000 mg | SUBLINGUAL_TABLET | SUBLINGUAL | Status: DC | PRN

## 2017-09-09 MED ORDER — CLOPIDOGREL BISULFATE 75 MG PO TABS
75.0000 mg | ORAL_TABLET | Freq: Every day | ORAL | Status: DC
  Administered 2017-09-10: 75 mg via ORAL
  Filled 2017-09-09: qty 1

## 2017-09-09 MED ORDER — CARVEDILOL 6.25 MG PO TABS
6.2500 mg | ORAL_TABLET | Freq: Every day | ORAL | Status: DC
  Administered 2017-09-10: 6.25 mg via ORAL
  Filled 2017-09-09: qty 1

## 2017-09-09 MED ORDER — METOLAZONE 5 MG PO TABS
2.5000 mg | ORAL_TABLET | Freq: Every day | ORAL | Status: DC
  Administered 2017-09-10: 2.5 mg via ORAL
  Filled 2017-09-09: qty 1

## 2017-09-09 MED ORDER — PANTOPRAZOLE SODIUM 40 MG PO TBEC: 40.0000 mg | DELAYED_RELEASE_TABLET | Freq: Every day | ORAL | Status: DC

## 2017-09-09 MED ORDER — IODIXANOL 320 MG/ML IV SOLN
INTRAVENOUS | Status: DC | PRN
  Administered 2017-09-09: 45 mL via INTRA_ARTERIAL

## 2017-09-09 MED ORDER — HEPARIN SODIUM (PORCINE) 1000 UNIT/ML IJ SOLN
INTRAMUSCULAR | Status: AC
  Filled 2017-09-09: qty 1

## 2017-09-09 MED ORDER — SODIUM CHLORIDE 0.9% FLUSH
3.0000 mL | Freq: Two times a day (BID) | INTRAVENOUS | Status: DC
  Administered 2017-09-09 – 2017-09-10 (×2): 3 mL via INTRAVENOUS

## 2017-09-09 MED ORDER — METHYLPREDNISOLONE SODIUM SUCC 125 MG IJ SOLR
125.0000 mg | INTRAMUSCULAR | Status: AC
  Administered 2017-09-09: 125 mg via INTRAVENOUS

## 2017-09-09 MED ORDER — SPIRONOLACTONE 25 MG PO TABS: 25.0000 mg | ORAL_TABLET | ORAL | Status: DC

## 2017-09-09 MED ORDER — DIPHENHYDRAMINE HCL 50 MG/ML IJ SOLN
INTRAMUSCULAR | Status: AC
  Administered 2017-09-09: 25 mg via INTRAVENOUS
  Filled 2017-09-09: qty 1

## 2017-09-09 MED ORDER — ONDANSETRON 4 MG PO TBDP
4.0000 mg | ORAL_TABLET | Freq: Three times a day (TID) | ORAL | Status: DC | PRN
  Filled 2017-09-09: qty 1

## 2017-09-09 MED ORDER — LABETALOL HCL 5 MG/ML IV SOLN: 10.0000 mg | INTRAVENOUS | Status: DC | PRN

## 2017-09-09 MED ORDER — PANTOPRAZOLE SODIUM 40 MG PO TBEC
40.0000 mg | DELAYED_RELEASE_TABLET | Freq: Every day | ORAL | Status: DC
  Administered 2017-09-10: 40 mg via ORAL
  Filled 2017-09-09: qty 1

## 2017-09-09 MED ORDER — FAMOTIDINE IN NACL 20-0.9 MG/50ML-% IV SOLN
20.0000 mg | INTRAVENOUS | Status: AC
  Administered 2017-09-09: 20 mg via INTRAVENOUS

## 2017-09-09 MED ORDER — FAMOTIDINE IN NACL 20-0.9 MG/50ML-% IV SOLN
INTRAVENOUS | Status: AC
  Administered 2017-09-09: 20 mg via INTRAVENOUS
  Filled 2017-09-09: qty 50

## 2017-09-09 MED ORDER — LOSARTAN POTASSIUM 50 MG PO TABS
50.0000 mg | ORAL_TABLET | Freq: Every day | ORAL | Status: DC
  Administered 2017-09-10: 50 mg via ORAL
  Filled 2017-09-09: qty 1

## 2017-09-09 MED ORDER — FENTANYL CITRATE (PF) 100 MCG/2ML IJ SOLN
INTRAMUSCULAR | Status: DC | PRN
  Administered 2017-09-09 (×2): 25 ug via INTRAVENOUS

## 2017-09-09 MED ORDER — HEPARIN (PORCINE) IN NACL 2-0.9 UNIT/ML-% IJ SOLN
INTRAMUSCULAR | Status: AC | PRN
  Administered 2017-09-09: 1000 mL

## 2017-09-09 MED ORDER — MIDAZOLAM HCL 2 MG/2ML IJ SOLN
INTRAMUSCULAR | Status: DC | PRN
  Administered 2017-09-09 (×2): 1 mg via INTRAVENOUS

## 2017-09-09 MED ORDER — SODIUM CHLORIDE 0.9 % IV SOLN: 250.0000 mL | INTRAVENOUS | Status: DC | PRN

## 2017-09-09 MED ORDER — MIDAZOLAM HCL 2 MG/2ML IJ SOLN
INTRAMUSCULAR | Status: AC
  Filled 2017-09-09: qty 2

## 2017-09-09 MED ORDER — SODIUM CHLORIDE 0.9 % IV SOLN
INTRAVENOUS | Status: DC
  Administered 2017-09-09: 21:00:00 via INTRAVENOUS

## 2017-09-09 MED ORDER — ACETAMINOPHEN 500 MG PO TABS
ORAL_TABLET | ORAL | Status: AC
  Filled 2017-09-09: qty 1

## 2017-09-09 MED ORDER — ACETAMINOPHEN 500 MG PO TABS
500.0000 mg | ORAL_TABLET | Freq: Four times a day (QID) | ORAL | Status: DC | PRN
  Administered 2017-09-09: 500 mg via ORAL
  Filled 2017-09-09: qty 1

## 2017-09-09 SURGICAL SUPPLY — 15 items
BALLN STERLING OTW 7X20X135 (BALLOONS) ×3
BALLOON STERLING OTW 7X20X135 (BALLOONS) ×2 IMPLANT
CATH OMNI FLUSH 5F 65CM (CATHETERS) ×3 IMPLANT
DEVICE CONTINUOUS FLUSH (MISCELLANEOUS) ×1 IMPLANT
GUIDE CATH VISTA IMA 6F (CATHETERS) ×3 IMPLANT
KIT ENCORE 26 ADVANTAGE (KITS) ×3 IMPLANT
KIT MICROINTRODUCER STIFF 5F (SHEATH) ×3 IMPLANT
KIT PV (KITS) ×3 IMPLANT
SHEATH PINNACLE 5F 10CM (SHEATH) ×2 IMPLANT
SHEATH PINNACLE 6F 10CM (SHEATH) ×3 IMPLANT
SYR MEDRAD MARK V 150ML (SYRINGE) ×3 IMPLANT
TRANSDUCER W/STOPCOCK (MISCELLANEOUS) ×3 IMPLANT
TRAY PV CATH (CUSTOM PROCEDURE TRAY) ×3 IMPLANT
WIRE BENTSON .035X145CM (WIRE) ×3 IMPLANT
WIRE SPARTACORE .014X300CM (WIRE) ×3 IMPLANT

## 2017-09-09 NOTE — Op Note (Signed)
    Patient name: [name] MRN: [id number] DOB: [birth year] Sex: female  09/09/2017 Pre-operative Diagnosis: celiac in-stent stenosis Post-operative diagnosis:  Same Surgeon:  Annamarie Major Procedure Performed:  1.  U/s guided right femoral artery access  2.  abdominla aortogram  3.  1st order cath (celiac artery)  4.  Angioplasty celiac artery  5.  Conscious sedation (33 minutes     Indications: The patient has a long-standing history of abdominal pain.  As a last ditch effort, her physicians felt treating a 90% celiac artery stenosis may help alleviate some of her symptoms.  This was done and 2018 with a 7 mm balloon expandable stent.  At her most recent office visit, ultrasound suggested a return of her stenosis.  She has not had any change in her symptoms.  Procedure:  The patient was identified in the holding area and taken to room 8.  The patient was then placed supine on the table and prepped and draped in the usual sterile fashion.  A time out was called.  Conscious sedation was administered with the use of IV fentanyl and Versed under continuous physician and nurse monitoring.  Heart rate, blood pressure, and oxygen saturation were continuously monitored.  Ultrasound was used to evaluate the right common femoral artery.  It was patent .  A digital ultrasound image was acquired.  A micropuncture needle was used to access the right common femoral artery under ultrasound guidance.  An 018 wire was advanced without resistance and a micropuncture sheath was placed.  The 018 wire was removed and a benson wire was placed.  The micropuncture sheath was exchanged for a 5 french sheath.  An omniflush catheter was advanced over the wire to the level of T-12.  An abdominal angiogram was obtained in the lateral projection..    Findings:   Aortogram: Approximately 50-60 percent stenosis was identified within the celiac artery stent.  The superior mesenteric artery has approximately 30% stenosis  which is unchanged.  Renal arteries were not well evaluated in the lateral projection   Intervention: After the above images were acquired the decision was made to proceed with intervention.  A IM guide catheter was advanced over the wire.  This was used to select the celiac artery.  A [place] core wire was then advanced out into the celiac artery.  I selected a 7 x 20 Sterling balloon and performed balloon angioplasty of the in-stent stenosis taking the balloon to rated pressure for 45 seconds.  Completion imaging was then performed which showed significant improvement of the stenosis with residual disease less than 10%.  At this point catheters and wires were removed.  The patient was taken to the holding area for sheath pull once the coagulation profile corrects.  Impression:  #1  Successful angioplasty of in stent celiac artery stenosis.  This was done using a 7 mm balloon with residual stenosis less than 10%.  #2  30% superior mesenteric artery stenosis, unchanged.    Theotis Burrow, M.D. [facility] [place] Office: [phone] Pager:  [phone]

## 2017-09-09 NOTE — Interval H&P Note (Signed)
History and Physical Interval Note:  09/09/2017 11:57 AM  [name]  has presented today for surgery, with the diagnosis of mesentric artery stenosis  The various methods of treatment have been discussed with the patient and family. After consideration of risks, benefits and other options for treatment, the patient has consented to  Procedure(s): VISCERAL ANGIOGRAPHY (N/A) as a surgical intervention .  The patient's history has been reviewed, patient examined, no change in status, stable for surgery.  I have reviewed the patient's chart and labs.  Questions were answered to the patient's satisfaction.     Annamarie Major

## 2017-09-09 NOTE — Progress Notes (Signed)
Site area: rt groin fa sheath Site Prior to Removal:  Level 0 Pressure Applied For:  20 minutes Manual:   yes Patient Status During Pull:  stable Post Pull Site:  Level  0 Post Pull Instructions Given:  yes Post Pull Pulses Present: palpable Dressing Applied:  Gauze and tegaderm Bedrest begins @ 1505 Comments:

## 2017-09-10 ENCOUNTER — Telehealth: Payer: Self-pay | Admitting: Surgery

## 2017-09-10 DIAGNOSIS — I252 Old myocardial infarction: Secondary | ICD-10-CM | POA: Diagnosis not present

## 2017-09-10 DIAGNOSIS — I2510 Atherosclerotic heart disease of native coronary artery without angina pectoris: Secondary | ICD-10-CM | POA: Diagnosis not present

## 2017-09-10 DIAGNOSIS — T82858A Stenosis of vascular prosthetic devices, implants and grafts, initial encounter: Secondary | ICD-10-CM | POA: Diagnosis not present

## 2017-09-10 DIAGNOSIS — Z955 Presence of coronary angioplasty implant and graft: Secondary | ICD-10-CM | POA: Diagnosis not present

## 2017-09-10 DIAGNOSIS — R7303 Prediabetes: Secondary | ICD-10-CM | POA: Diagnosis not present

## 2017-09-10 DIAGNOSIS — K551 Chronic vascular disorders of intestine: Secondary | ICD-10-CM | POA: Diagnosis not present

## 2017-09-10 LAB — CBC
HCT: 33.6 % — ABNORMAL LOW (ref 36.0–46.0)
HEMOGLOBIN: 10.9 g/dL — AB (ref 12.0–15.0)
MCH: 29.1 pg (ref 26.0–34.0)
MCHC: 32.4 g/dL (ref 30.0–36.0)
MCV: 89.8 fL (ref 78.0–100.0)
PLATELETS: 261 10*3/uL (ref 150–400)
RBC: 3.74 MIL/uL — AB (ref 3.87–5.11)
RDW: 12.6 % (ref 11.5–15.5)
WBC: 7.8 10*3/uL (ref 4.0–10.5)

## 2017-09-10 LAB — GLUCOSE, CAPILLARY: Glucose-Capillary: 156 mg/dL — ABNORMAL HIGH (ref 65–99)

## 2017-09-10 NOTE — Plan of Care (Signed)
  Progressing Clinical Measurements: Ability to maintain clinical measurements within normal limits will improve 09/10/2017 0408 - Progressing by Colonel Bald, RN Note VSS.  No distress noted. Will remain free from infection 09/10/2017 0408 - Progressing by Colonel Bald, RN Note No s/s of infection noted. Activity: Risk for activity intolerance will decrease 09/10/2017 0408 - Progressing by Colonel Bald, RN Note Ambulates to bathroom with standby assist without difficulty.

## 2017-09-10 NOTE — Discharge Summary (Signed)
Discharge Summary    [name] Dec 05, 1940 77 y.o. female  [id number]  Admission Date: 09/09/2017  Discharge Date: 09/10/2017  Physician: Serafina Mitchell, MD  Admission Diagnosis: PAD (peripheral artery disease) [facility]) [I73.9]   HPI:   This is a 77 y.o. female  who is s/p celiac artery stenting on 01/14/2017 by Dr. Trula Slade using a 7 x 19 Omnilink stent for a 90% stenosis. Unfortunately, the patient has not had any change in her symptoms since the stent placement.  The patient has had chronic abdominal pain. She hadexhausted all medical options to relieve her pain. The only thing remaining was her celiac stenosis.She has a long-standing history of abdominal pain. She underwent cholecystectomy at age 48 she is been diagnosed with pancreatic divisum and has had stents placed 20 years ago at [facility]. She continues to have abdominal pain. She has undergone multiple trials which have been unsuccessful with resolving her pain.   She has had diarrhea and vomiting for the last 2 months. She feels slightly better recently. She has severe bilateral upper quadrant abdominal pain and bilateral flank pain after eating, but she has the same type pain other times that is not post prandial.   Dr. Trula Slade last evaluated pt on 03-03-17. At that time mesenteric ultrasound: Unable to visualize celiac artery. Celiac stenting: Unfortunately, the patient did not have any relief of her abdominal pain after stenting. She will now be on protocol for follow-up. Leg swelling: Venous reflux examination was essentially normal. Dr. Trula Slade counseled the patient to wear compression stockings and return in 6 months for possible evaluation of a Normatec device.   Pt does not need a Normatec device; her lower legs swelling has mostly subsided since her significant unintentional weight loss.  She is taking metolazone and Lasix for this.   She had a left renal artery stent placed by a cardiologist in [facility], and Dr. Ellyn Hack has been checking this with ultrasounds.    Hospital Course:  The patient was admitted to the hospital and taken to the operating room on 09/09/2017 and underwent: Procedure Performed:             1.  U/s guided right femoral artery access             2.  abdominla aortogram             3.  1st order cath (celiac artery)             4.  Angioplasty celiac artery             5.  Conscious sedation (33 minutes    Findings:              Aortogram: Approximately 50-60 percent stenosis was identified within the celiac artery stent.  The superior mesenteric artery has approximately 30% stenosis which is unchanged.  Renal arteries were not well evaluated in the lateral projection  The pt tolerated the procedure well and was transported to the PACU in good condition.   By POD 1, she was doing well-right groin was soft without hematoma.  Her plavix is continued.  She did have some itching overnight but says it has improved.  The pt was pre-medicated for contrast allergy.  She was doing well and discharged home.  The remainder of the hospital course consisted of increasing mobilization and increasing intake of solids without difficulty.  CBC    Component Value Date/Time   WBC 7.8 09/10/2017 0400   RBC  3.74 (L) 09/10/2017 0400   HGB 10.9 (L) 09/10/2017 0400   HCT 33.6 (L) 09/10/2017 0400   PLT 261 09/10/2017 0400   MCV 89.8 09/10/2017 0400   MCH 29.1 09/10/2017 0400   MCHC 32.4 09/10/2017 0400   RDW 12.6 09/10/2017 0400   LYMPHSABS 1.7 05/27/2017 1805   MONOABS 1.0 05/27/2017 1805   EOSABS 0.1 05/27/2017 1805   BASOSABS 0.0 05/27/2017 1805    BMET    Component Value Date/Time   NA 135 09/09/2017 1657   K 3.3 (L) 09/09/2017 1657   CL 98 (L) 09/09/2017 1657   CO2 26 09/09/2017 1657   GLUCOSE 192 (H) 09/09/2017 1657   BUN 21 (H) 09/09/2017 1657   CREATININE 0.82 09/09/2017 1657   CREATININE 0.90 10/24/2016 1127   CALCIUM 8.8 (L) 09/09/2017 1657   GFRNONAA >60  09/09/2017 1657   GFRAA >60 09/09/2017 1657      Discharge Instructions    Discharge patient   Complete by:  As directed    Discharge disposition:  01-Home or Self Care   Discharge patient date:  09/10/2017      Discharge Diagnosis:  PAD (peripheral artery disease) ([place]) [I73.9]  Secondary Diagnosis: Patient Active Problem List   Diagnosis Date Noted  . Hypokalemia 07/31/2017  . Nausea and vomiting 05/27/2017  . Costochondritis, acute 01/21/2017  . PAD (peripheral artery disease) ([place]) 01/14/2017  . Continuous severe abdominal pain 10/11/2016  . RUQ abdominal pain 10/11/2016  . Type 2 diabetes mellitus with peripheral neuropathy ([place]) 10/09/2016  . Hammer toe of right foot 11/16/2015  . Onychomycosis due to dermatophyte 11/16/2015  . Exertional dyspnea 03/16/2015  . Abnormal electrocardiogram during exercise stress test 03/15/2015  . Pre-operative cardiovascular examination, new EKG abnormalities c/w ischemia 03/15/2015  . Obesity (BMI 30.0-34.9) 06/10/2014  . Mesenteric artery stenosis ([place]) 04/20/2014  . Idiopathic chronic pancreatitis suspected 03/10/2014  . Abnormal CT of liver-possible cirrhosis 03/10/2014  . Dysphagia 03/10/2014  . Long term current use of antithrombotics/antiplatelets - clopidogrel 03/10/2014  . Bruit of right carotid artery 02/27/2014  . Abdominal pain, epigastric 01/19/2014  . History of pancreatitis 01/19/2014  . Leg cramps 01/19/2014  . Palpitation 01/12/2014  . Stasis edema of bilateral lower extremity   . Congestive heart failure with LV diastolic dysfunction, NYHA class 2 ([place])   . Essential hypertension   . Dyslipidemia, goal LDL below 70 - WITH STATIN INTOLERANCE   . Celiac artery stenosis: 60% by Duplex - 90-95% by cath - Med Rx 11/27/2013  . Renal artery stenosis ([place]) 08/29/2012  . MI (myocardial infarction) ([place]) 04/02/2012  . CAD S/P percutaneous coronary angioplasty: pRCA BMS, mLAD BMS overlapped prox with DES for ISR 07/30/2010    . GERD (gastroesophageal reflux disease) 01/24/2009  . Diarrhea due to malabsorption 01/24/2009  . Hypothyroid 11/12/2007  . Irritable bowel syndrome 11/12/2007  . RENAL CALCULUS 11/12/2007  . PANCREAS DIVISUM 11/12/2007  . HIATAL HERNIA 11/20/2005   Past Medical History:  Diagnosis Date  . Arthritis    "fingers, right shoulder" (09/09/2017)  . Atrial fibrillation ([place])   . Back pain with radiation    Chronic Back Pain - mutliple surgeries (including tumor removal)  . Bilateral edema of lower extremity    Chronic, likely related to venous stasis  . CAD S/P percutaneous coronary angioplasty    a) LHC: 07/30/10. -- 3.0x30mm Integrity BMS to pRCA & 2.5x 5mm Integrity BMS mLAD(@ D2).  b) Class III-IV Angina 03/2011: LHC- ISR in LAD BMS --  prox overlapping Promus DES 2.5x48mm and PTCA of jailed D2 ostium-prox 80%. c) 02/03/12:  LHC- patent stents.  Jailed diagonal. with stable flow; d) Peri-OP NSTEMI 04/2012 - LHC in 12/'13 -   . Celiac artery stenosis ([place])    12/2016 - STENT placement  . Complication of anesthesia    "used to wake up wild years ago" (09/09/2017)  . Congestive heart failure with LV diastolic dysfunction, NYHA class 2 ([place])    06/13/10:  last 2D echo-  EF >55%, Mild TR, Mod Conc LVH - Grade 1 diastolic dysfunction (abnormal relaxation) --> LVEDP on Cath 28 mmHg & mild 2nd Pulm HTN  . Diet-controlled type 2 diabetes mellitus ([place])   . Diverticulitis of colon (without mention of hemorrhage)(562.11)   . Dyslipidemia, goal LDL below 70    Intolerant to statins  . GERD (gastroesophageal reflux disease)   . Hepatitis ~ 1957   "yellow jaundice" (01/14/2017)  . Hepatomegaly   . Hiatal hernia   . History of blood transfusion 04/2012   "when I had a heart attack"  . History of kidney stones    "I've got a stone embedded in one of my kidneys" (09/09/2017)  . History of stomach ulcers "years ago"  . Irritable bowel syndrome (IBS)   . Labile essential hypertension    Partially related  to RAS  . Mesenteric artery stenosis (H[facility])    95% Celiac Artery - ostial, 20-30% SMA.  Bilateral Renal A: L RA stent patent, R RA 20-30% -- Conservative Management  . NSTEMI (non-ST elevated myocardial infarction) ([place]) 04/2012   Unclear the details, apparently this was postoperative from her back surgery that she was cleared for my last saw her in June.  Reportedly had stents placed  . Pancreas divisum    On pancreatic enzyme  . Renal artery stenosis ([place]) 2011; 12/'13   a) Angiogram 02/03/12:  50-60%L RA stenosis, 40% R R Inferior artery; b) 12/'13: S/P L RA Stent ([place]) 6.0 mm x 15 mm; c) Renal Duplex 10/2013: <60% L RA, <60 R RA, ~60% SMA & Celiac A.  . Stricture and stenosis of esophagus   . Unspecified gastritis and gastroduodenitis without mention of hemorrhage      Allergies as of 09/10/2017      Reactions   Codeine Phosphate Anaphylaxis, Shortness Of Breath, Swelling   Contrast Media [iodinated Diagnostic Agents]    swelling   Iodine Swelling, Rash   REACTION: unspecified This allergy is to Topical iodine only.    Prednisone Itching, Rash   Keeps pt up all night    Statins Other (See Comments)   Intolerant   Tramadol Other (See Comments)   Kept patient awake      Medication List    TAKE these medications   acetaminophen 500 MG tablet Commonly known as:  TYLENOL Take 2,000 mg by mouth 4 (four) times daily as needed for moderate pain.   amitriptyline 25 MG tablet Commonly known as:  ELAVIL TAKE 1 TABLET(25 MG) BY MOUTH AT BEDTIME What changed:  See the new instructions.   carvedilol 6.25 MG tablet Commonly known as:  COREG Take 6.25 mg by mouth daily.   clopidogrel 75 MG tablet Commonly known as:  PLAVIX TAKE 1 TABLET(75 MG) BY MOUTH DAILY   furosemide 80 MG tablet Commonly known as:  LASIX Take 1 tablet (80 mg total) by mouth 2 (two) times daily. What changed:    when to take this  additional instructions   Lidocaine  4 % Ptch Apply 1 patch  topically daily as needed (pain).   lidocaine 5 % ointment Commonly known as:  XYLOCAINE Apply 1 application topically at bedtime as needed (toe pain).   losartan 50 MG tablet Commonly known as:  COZAAR TAKE 1 TABLET(50 MG) BY MOUTH DAILY   metolazone 2.5 MG tablet Commonly known as:  ZAROXOLYN Take 1 to 2 tablets  by mouth 30-60 minutes prior to morning dose of lasix AS NEEDED What changed:    how much to take  how to take this  when to take this  additional instructions   nitroGLYCERIN 0.4 MG SL tablet Commonly known as:  NITROSTAT DISSOLVE 1 TABLET UNDER THE TONGUE EVERY 5 MINUTES AS NEEDED FOR CHEST PAIN   pantoprazole 40 MG tablet Commonly known as:  PROTONIX Take 1 tablet (40 mg total) by mouth daily. Will need further refills from your new gi physician   potassium chloride SA 20 MEQ tablet Commonly known as:  K-DUR,KLOR-CON Take 20 mEq by mouth daily.   spironolactone 25 MG tablet Commonly known as:  ALDACTONE Take 1 tablet (25 mg total) by mouth every Monday, Wednesday, and Friday at 6 PM.   TEARS RENEWED OP Apply 1 drop to eye daily as needed (dry eyes).       Prescriptions given: none  Disposition: home  Patient's condition: is Good  Follow up: 1. [facility] in 6 months   Leontine Locket, [place] [facility] [phone] 09/10/2017  12:50 PM

## 2017-09-10 NOTE — Telephone Encounter (Signed)
-----   Message from Mena Goes, RN sent at 09/09/2017  2:23 PM EST ----- Regarding: 6 months   ----- Message ----- From: Serafina Mitchell, MD Sent: 09/09/2017  12:50 PM To: [facility] Charge Pool  09/09/2017:  Surgeon:  Annamarie Major Procedure Performed:  1.  U/s guided right femoral artery access  2.  abdominla aortogram  3.  1st order cath (celiac artery)  4.  Angioplasty celiac artery  5.  Conscious sedation (33 minutes    Follow-up 6 months with Vinnie Level and mesenteric artery duplex

## 2017-09-10 NOTE — Progress Notes (Signed)
  Progress Note    09/10/2017 7:42 AM 1 Day Post-Op  Subjective:  Says she can't tell much of a difference than before surgery.  Legs itched a lot last night but better  Tm 99 now afebrile 28'B-151'V systolic 61'Y-07'P NSR 71% RA  Vitals:   09/10/17 0118 09/10/17 0434  BP: (!) 124/45 (!) 121/50  Pulse:  92  Resp:  19  Temp:  97.9 F (36.6 C)  SpO2: 96% 97%    Physical Exam: General:  No distress; resting comfortably Lungs:  Non labored Incisions:  Right groin is soft without hematoma Extremities:  Right foot is warm and well perfused. Abdomen:  Soft, NT/ND to palpation  CBC    Component Value Date/Time   WBC 7.8 09/10/2017 0400   RBC 3.74 (L) 09/10/2017 0400   HGB 10.9 (L) 09/10/2017 0400   HCT 33.6 (L) 09/10/2017 0400   PLT 261 09/10/2017 0400   MCV 89.8 09/10/2017 0400   MCH 29.1 09/10/2017 0400   MCHC 32.4 09/10/2017 0400   RDW 12.6 09/10/2017 0400   LYMPHSABS 1.7 05/27/2017 1805   MONOABS 1.0 05/27/2017 1805   EOSABS 0.1 05/27/2017 1805   BASOSABS 0.0 05/27/2017 1805    BMET    Component Value Date/Time   NA 135 09/09/2017 1657   K 3.3 (L) 09/09/2017 1657   CL 98 (L) 09/09/2017 1657   CO2 26 09/09/2017 1657   GLUCOSE 192 (H) 09/09/2017 1657   BUN 21 (H) 09/09/2017 1657   CREATININE 0.82 09/09/2017 1657   CREATININE 0.90 10/24/2016 1127   CALCIUM 8.8 (L) 09/09/2017 1657   GFRNONAA >60 09/09/2017 1657   GFRAA >60 09/09/2017 1657    INR    Component Value Date/Time   INR 1.00 09/09/2017 1657     Intake/Output Summary (Last 24 hours) at 09/10/2017 0742 Last data filed at 09/10/2017 0335 Gross per 24 hour  Intake 1113.76 ml  Output -  Net 1113.76 ml     Assessment:  77 y.o. female is s/p:  Procedure Performed:             1.  U/s guided right femoral artery access             2.  abdominla aortogram             3.  1st order cath (celiac artery)             4.  Angioplasty celiac artery             5.  Conscious sedation (33 minutes  1  Day Post-Op  Plan: -pt doing well this am-states she really can't tell much difference than before the procedure but has hopes her sx will improve -right groin soft without hematoma -continue Plavix -had itching overnight but that has improved this am.  Pt was premedicated for contrast allergy -most likely discharge home today.   Leontine Locket, PA-C [facility] [phone] 09/10/2017 7:42 AM

## 2017-09-10 NOTE — Telephone Encounter (Signed)
Sched appt 03/16/18; lab at 8:00 and MD at 8:45.mailed appt letter.

## 2017-09-10 NOTE — Progress Notes (Signed)
Discharge instructions given. Pt verbalized understanding and all questions were answered.  

## 2017-09-12 ENCOUNTER — Other Ambulatory Visit: Payer: Self-pay

## 2017-09-12 DIAGNOSIS — I701 Atherosclerosis of renal artery: Secondary | ICD-10-CM

## 2017-09-15 DIAGNOSIS — L97512 Non-pressure chronic ulcer of other part of right foot with fat layer exposed: Secondary | ICD-10-CM | POA: Diagnosis not present

## 2017-09-15 DIAGNOSIS — E1142 Type 2 diabetes mellitus with diabetic polyneuropathy: Secondary | ICD-10-CM | POA: Diagnosis not present

## 2017-09-15 DIAGNOSIS — E11621 Type 2 diabetes mellitus with foot ulcer: Secondary | ICD-10-CM | POA: Diagnosis not present

## 2017-09-15 DIAGNOSIS — M2041 Other hammer toe(s) (acquired), right foot: Secondary | ICD-10-CM | POA: Diagnosis not present

## 2017-09-16 ENCOUNTER — Encounter (HOSPITAL_COMMUNITY): Payer: Self-pay | Admitting: Surgery

## 2017-09-28 ENCOUNTER — Other Ambulatory Visit: Payer: Self-pay | Admitting: Internal Medicine

## 2017-10-06 ENCOUNTER — Ambulatory Visit (INDEPENDENT_AMBULATORY_CARE_PROVIDER_SITE_OTHER): Payer: Medicare Other | Admitting: Cardiology

## 2017-10-06 ENCOUNTER — Encounter: Payer: Self-pay | Admitting: Cardiology

## 2017-10-06 VITALS — BP 143/66 | HR 81 | Ht 68.0 in | Wt 191.8 lb

## 2017-10-06 DIAGNOSIS — I2109 ST elevation (STEMI) myocardial infarction involving other coronary artery of anterior wall: Secondary | ICD-10-CM

## 2017-10-06 DIAGNOSIS — Z9861 Coronary angioplasty status: Secondary | ICD-10-CM | POA: Diagnosis not present

## 2017-10-06 DIAGNOSIS — I87302 Chronic venous hypertension (idiopathic) without complications of left lower extremity: Secondary | ICD-10-CM

## 2017-10-06 DIAGNOSIS — E785 Hyperlipidemia, unspecified: Secondary | ICD-10-CM

## 2017-10-06 DIAGNOSIS — I10 Essential (primary) hypertension: Secondary | ICD-10-CM

## 2017-10-06 DIAGNOSIS — I5030 Unspecified diastolic (congestive) heart failure: Secondary | ICD-10-CM

## 2017-10-06 DIAGNOSIS — I701 Atherosclerosis of renal artery: Secondary | ICD-10-CM

## 2017-10-06 DIAGNOSIS — I774 Celiac artery compression syndrome: Secondary | ICD-10-CM | POA: Diagnosis not present

## 2017-10-06 DIAGNOSIS — E669 Obesity, unspecified: Secondary | ICD-10-CM | POA: Diagnosis not present

## 2017-10-06 DIAGNOSIS — I2510 Atherosclerotic heart disease of native coronary artery without angina pectoris: Secondary | ICD-10-CM | POA: Diagnosis not present

## 2017-10-06 DIAGNOSIS — R002 Palpitations: Secondary | ICD-10-CM | POA: Diagnosis not present

## 2017-10-06 DIAGNOSIS — E876 Hypokalemia: Secondary | ICD-10-CM | POA: Diagnosis not present

## 2017-10-06 DIAGNOSIS — I771 Stricture of artery: Secondary | ICD-10-CM

## 2017-10-06 MED ORDER — CARVEDILOL 6.25 MG PO TABS: 6.2500 mg | ORAL_TABLET | Freq: Two times a day (BID) | ORAL | 6 refills | Status: DC

## 2017-10-06 MED ORDER — POTASSIUM CHLORIDE ER 10 MEQ PO CPCR: 20.0000 meq | ORAL_CAPSULE | Freq: Two times a day (BID) | ORAL | 6 refills | Status: DC

## 2017-10-06 NOTE — Progress Notes (Signed)
PCP: Kristopher Glee., MD  Clinic Note: Chief Complaint  Patient presents with  . Follow-up    Overall feeling better  . Coronary Artery Disease  . Palpitations    HPI: [name] is a 77 y.o. female with a PMH below who presents today for 2 month f/u (was supposed to see CVRR) -but now presents with complaints of palpitations.[name] has a history of known CAD-PCI as well as chronic diastolic dysfunction and PAD /splanchnic artery disease.  CAD: pRCA & mLAD x 2 in 2011 (BMS stents used for preop back surgery), DES x 2 LAD for ISR Myoview May 2018: Normal EF (hyperdynamic LV function EF greater than 65%).  LOW RISK.  No ischemia or infarction. PAD: - RAS - s/p renal stent; celiac/SMA stenosis s/p SMA stent (recent)  Also has very difficult to control lipids with statin intolerance and refusal to take other medications. - Has been evaluated by our [place] team - but stopped going  Chronic edema at least in part partially related to diastolic dysfunction, also related to venous insufficiency. - Usually controlled with Lasix and metolazone  May 07, 2017 --was actually doing relatively well from a cardiac symptom.  She is mostly dealing with her GI issues of nausea emesis and loose stools.  Continuing weight loss.  Noted well-controlled swelling.  And also well controlled chest discomfort.  [name] was last seen on July 31, 2017 -noted recurrent palpitations.  More dizziness and lightheadedness as well as orthostatic symptoms.  Discussed monitoring with smart phone application.  Added spironolactone on Monday Wednesday Friday as opposed to taking the second dose of Lasix.  (Concern for hypokalemia.  Recent Hospitalizations:   Admitted September 09, 2017 for abdominal pain -> underwent celiac artery angioplasty (~60% ISR); had fluctuating BP  Studies Personally Reviewed - (if available, images/films reviewed: From Epic Chart or Care Everywhere)    Interval  History:  [name] returns today actually doing fairly well.  She is not noticing the palpitations as much, I think she just become aware that they are there but not overly bothersome associated with it.  She has been doing better eating and maintaining her weight a little bit better a little better energy overall. If she really pushes that she will definitely noticed some dyspnea or chest discomfort.  However she acknowledges that she is somewhat deconditioned and has not been able to be that active..  Less frequent orthostatic type symptoms.  She indicates that she is still doing the best she can to be active and does all her kitchen chores and cooking.  Swelling does seem to be stable with Lasix and metolazone, however on the days that she does not take the second dose of Lasix because she takes spironolactone, the swelling Does get worse port.  She denies any PND orthopnea.  She really does not have significant episodes of chest discomfort/tightness or pressure.  No syncope/near syncope or TIA/amaurosis fugax symptoms.  No melena, hematochezia, hematuria or epistaxis.  No claudication.  She still has her abdominal pain and really does not notice that she had much benefit from her angioplasty of the celiac artery.   ROS: A comprehensive was performed. Review of Systems  Constitutional: Positive for malaise/fatigue. Negative for chills, fever and weight loss (Weight seems to have finally stabilized out).  HENT: Negative for congestion.   Respiratory: Negative for cough, shortness of breath and wheezing.   Cardiovascular:       Per HPI  Gastrointestinal:  Positive for abdominal pain, diarrhea and nausea. Negative for blood in stool, melena and vomiting.  Genitourinary: Negative for dysuria, frequency and hematuria.  Musculoskeletal: Positive for joint pain and myalgias.  Neurological: Positive for dizziness (With palpitations, and orthostatic). Negative for weakness.  Psychiatric/Behavioral: The  patient is nervous/anxious and has insomnia.   All other systems reviewed and are negative.   I have reviewed and (if needed) personally updated the patient's problem list, medications, allergies, past medical and surgical history, social and family history.   Past Medical History:  Diagnosis Date  . Arthritis    "fingers, right shoulder" (09/09/2017)  . Atrial fibrillation ([place])   . Back pain with radiation    Chronic Back Pain - mutliple surgeries (including tumor removal)  . Bilateral edema of lower extremity    Chronic, likely related to venous stasis  . CAD S/P percutaneous coronary angioplasty    a) LHC: 07/30/10. -- 3.0x74mm Integrity BMS to pRCA & 2.5x 49mm Integrity BMS mLAD(@ D2).  b) Class III-IV Angina 03/2011: LHC- ISR in LAD BMS -- prox overlapping Promus DES 2.5x7mm and PTCA of jailed D2 ostium-prox 80%. c) 02/03/12:  LHC- patent stents.  Jailed diagonal. with stable flow; d) Peri-OP NSTEMI 04/2012 - LHC in 12/'13 -   . Celiac artery stenosis ([place])    12/2016 - STENT placement  . Complication of anesthesia    "used to wake up wild years ago" (09/09/2017)  . Congestive heart failure with LV diastolic dysfunction, NYHA class 2 ([place])    06/13/10:  last 2D echo-  EF >55%, Mild TR, Mod Conc LVH - Grade 1 diastolic dysfunction (abnormal relaxation) --> LVEDP on Cath 28 mmHg & mild 2nd Pulm HTN  . Diet-controlled type 2 diabetes mellitus ([place])   . Diverticulitis of colon (without mention of hemorrhage)(562.11)   . Dyslipidemia, goal LDL below 70    Intolerant to statins  . GERD (gastroesophageal reflux disease)   . Hepatitis ~ 1957   "yellow jaundice" (01/14/2017)  . Hepatomegaly   . Hiatal hernia   . History of blood transfusion 04/2012   "when I had a heart attack"  . History of kidney stones    "I've got a stone embedded in one of my kidneys" (09/09/2017)  . History of stomach ulcers "years ago"  . Irritable bowel syndrome (IBS)   . Labile essential hypertension    Partially  related to RAS  . Mesenteric artery stenosis (HCC)    95% Celiac Artery - ostial, 20-30% SMA.  Bilateral Renal A: L RA stent patent, R RA 20-30% -- Conservative Management  . NSTEMI (non-ST elevated myocardial infarction) ([place]) 04/2012   Unclear the details, apparently this was postoperative from her back surgery that she was cleared for my last saw her in June.  Reportedly had stents placed  . Pancreas divisum    On pancreatic enzyme  . Renal artery stenosis ([place]) 2011; 12/'13   a) Angiogram 02/03/12:  50-60%L RA stenosis, 40% R R Inferior artery; b) 12/'13: S/P L RA Stent ([facility]) 6.0 mm x 15 mm; c) Renal Duplex 10/2013: <60% L RA, <60 R RA, ~60% SMA & Celiac A.  . Stricture and stenosis of esophagus   . Unspecified gastritis and gastroduodenitis without mention of hemorrhage     Past Surgical History:  Procedure Laterality Date  . APPENDECTOMY    . BACK SURGERY    . BREAST DUCTAL SYSTEM EXCISION     right  . BREAST SURGERY Right   .  CARDIAC CATHETERIZATION N/A 03/16/2015   Procedure: Left Heart Cath and Coronary Angiography;  Surgeon: Leonie Man, MD;  Location: [facility];  Service: Cardiovascular; Widely patent m-dLAD stents as wellas pRCA stent.  High LVEDP, small Diag & Om vessels with moderate stenosis   . Cardiac Event Monitor  01/2017   Mostly NSR with occasional sinus tachycardia and rare bradycardia. No A. fib. No PND or PSVT. Rare PACs and PVCs.  Marland Kitchen CARDIOVASCULAR STRESS TEST     11/07/11:  Normal perfusion pattern.  EF 66% No wall motion abnormalities.   Marland Kitchen CATARACT EXTRACTION W/ INTRAOCULAR LENS  IMPLANT, BILATERAL Bilateral   . CHOLECYSTECTOMY OPEN    . COLONOSCOPY  03/01/2009   normal   . CORONARY ANGIOPLASTY WITH STENT PLACEMENT  07/2010, 02/2011   LHC: 07/30/10.  3.0x91mm Integrity BMS to RCA and 2.5x 93mm Integrity BMS LAD.  03/2011: LHC- ISRS in LAD treated with overlapping Promus DES 2.5x74mm and PTCA of Diagonal.  02/03/12:  LHC- patent stents.  Jailed  diagonal.  . DOBUTAMINE STRESS ECHO  03/07/2015   [facility] (ordered for pre-op evaluation for EUS/ERCP --> abnormal EKG:  strreesss test shhoowwed 1 mm ST segment depressions downsloping. No wall motion abnormalities at peak exercise or at rest. Diastolic dysfunction was noted but normal systolic function - EF greater than 55%. No bouts of regurgitation or stenosis. Resting hypertension with exaggerated response  . ESOPHAGOGASTRODUODENOSCOPY  04/08/2012  . ESOPHAGOGASTRODUODENOSCOPY (EGD) WITH ESOPHAGEAL DILATION  X 2  . FRACTURE SURGERY    . HAMMER TOE SURGERY Bilateral    "took bone off the top of 2nd toe on each foot"  . HIP SURGERY Left    "something to do w/my back"  . JOINT REPLACEMENT    . KNEE SURGERY Left    "had fluid drained off it a couple times"  . LEFT HEART CATHETERIZATION WITH CORONARY ANGIOGRAM N/A 02/03/2012   Procedure: LEFT HEART CATHETERIZATION WITH CORONARY ANGIOGRAM;  Surgeon: Leonie Man, MD;  Location: [facility] CATH LAB;  Service: Cardiovascular;  Laterality: N/A;  . LEFT HEART CATHETERIZATION WITH CORONARY ANGIOGRAM N/A 05/12/2014   Procedure: LEFT HEART CATHETERIZATION WITH CORONARY ANGIOGRAM;  Surgeon: Lorretta Harp, MD;  Location: [facility] CATH LAB;  Service: Cardiovascular: Stable CAD. Patent stents. Patent renal artery stent  . LEFT HEART CATHETERIZATION WITH CORONARY ANGIOGRAM  08/2012   Peri-Op MI @ [facility] -- 40% ostial D1, patent LAD stents, 10% RCA ISR  . LEFT HEART CATHETERIZATION WITH CORONARY ANGIOGRAM   03/2012   ~30% ISR RCA, patent LAD stent & D2; EDP ~28 mmHg  . NM MYOVIEW LTD  12/2016   LOW RISK study. No ischemia or infarction. EF 65-75%.  Marland Kitchen PANCREAS SURGERY     "stent in my pancreas"; Dr Pershing Proud  . PERCUTANEOUS PINNING PHALANX FRACTURE OF HAND Left ~ 2013  . PERIPHERAL VASCULAR BALLOON ANGIOPLASTY  09/09/2017   Procedure: PERIPHERAL VASCULAR BALLOON ANGIOPLASTY;  Surgeon: Serafina Mitchell, MD;  Location: [place] [facility];  Service:  Cardiovascular;;  Celiac instent  . PERIPHERAL VASCULAR INTERVENTION  01/14/2017   Procedure: Peripheral Vascular Intervention;  Surgeon: Serafina Mitchell, MD;  Location: [facility];  Service: Cardiovascular;;  mesentric  . RENAL ARTERY STENT Left 08/2012   @ [facility] - 6.0 mm x 15 mm  . SPINE SURGERY     tumor removed 07/2010; Redo Surgery 04/2012; Sacroiliac Sgx 08/2013  . TOTAL ABDOMINAL HYSTERECTOMY    .  TOTAL KNEE ARTHROPLASTY Left ~ 2012  . VISCERAL ANGIOGRAM N/A 05/12/2014   Procedure: VISCERAL ANGIOGRAM;  Surgeon: Lorretta Harp, MD;  Location: [facility] CATH LAB;  Service: Cardiovascular;  Laterality: N/A;  . VISCERAL ANGIOGRAPHY N/A 01/14/2017   Procedure: Mesenteric  Angiography;  Surgeon: Serafina Mitchell, MD;  Location: [facility];  Service: Cardiovascular;  Laterality: N/A;  . VISCERAL ANGIOGRAPHY N/A 09/09/2017   Procedure: VISCERAL ANGIOGRAPHY;  Surgeon: Serafina Mitchell, MD;  Location: [place] [facility];  Service: Cardiovascular;  Laterality: N/A;    Current Meds  Medication Sig  . acetaminophen (TYLENOL) 500 MG tablet Take 2,000 mg by mouth 4 (four) times daily as needed for moderate pain.   Marland Kitchen amitriptyline (ELAVIL) 25 MG tablet TAKE 1 TABLET(25 MG) BY MOUTH AT BEDTIME (Patient taking differently: TAKE 1 TABLET(25 MG) BY MOUTH AT BEDTIME AS NEEDED FOR SLEEP)  . Artificial Tear Solution (TEARS RENEWED OP) Apply 1 drop to eye daily as needed (dry eyes).  . carvedilol (COREG) 6.25 MG tablet Take 1 tablet (6.25 mg total) by mouth 2 (two) times daily with a meal. May take an extra tablet daily if having palpations  . clopidogrel (PLAVIX) 75 MG tablet TAKE 1 TABLET(75 MG) BY MOUTH DAILY  . furosemide (LASIX) 80 MG tablet Take 80 mg by mouth.  . lidocaine (XYLOCAINE) 5 % ointment Apply 1 application topically at bedtime as needed (toe pain).   . Lidocaine 4 % PTCH Apply 1 patch topically daily as needed (pain).  Marland Kitchen losartan (COZAAR) 50 MG tablet TAKE 1 TABLET(50  MG) BY MOUTH DAILY  . metolazone (ZAROXOLYN) 2.5 MG tablet Take 1 to 2 tablets  by mouth 30-60 minutes prior to morning dose of lasix AS NEEDED (Patient taking differently: Take 2.5 mg by mouth daily. Before lasix)  . nitroGLYCERIN (NITROSTAT) 0.4 MG SL tablet DISSOLVE 1 TABLET UNDER THE TONGUE EVERY 5 MINUTES AS NEEDED FOR CHEST PAIN  . spironolactone (ALDACTONE) 25 MG tablet Take 1 tablet (25 mg total) by mouth every Monday, Wednesday, and Friday at 6 PM.  . [DISCONTINUED] carvedilol (COREG) 6.25 MG tablet Take 6.25 mg by mouth daily.   . [DISCONTINUED] potassium chloride SA (K-DUR,KLOR-CON) 20 MEQ tablet Take 20 mEq by mouth daily.    Allergies  Allergen Reactions  . Codeine Phosphate Anaphylaxis, Shortness Of Breath and Swelling  . Contrast Media [Iodinated Diagnostic Agents]     swelling  . Iodine Swelling and Rash    REACTION: unspecified This allergy is to Topical iodine only.   . Prednisone Itching and Rash    Keeps pt up all night   . Statins Other (See Comments)    Intolerant   . Tramadol Other (See Comments)    Kept patient awake    Social History   Socioeconomic History  . Marital status: Married    Spouse name: None  . Number of children: 1  . Years of education: None  . Highest education level: None  Social Needs  . Financial resource strain: None  . Food insecurity - worry: None  . Food insecurity - inability: None  . Transportation needs - medical: None  . Transportation needs - non-medical: None  Occupational History  . Occupation: Programmer, applications  Tobacco Use  . Smoking status: Never Smoker  . Smokeless tobacco: Never Used  Substance and Sexual Activity  . Alcohol use: No  . Drug use: No  . Sexual activity: Not Currently  Other Topics Concern  . None  Social History Narrative   Married mother of one, one grandchild.   Very socially active, enjoys cooking and having get-togethers her house. She had been exercising regularly but her back pain limits  her.   Does not smoke, does not drink alcohol.   Family History family history includes Cancer in her father; Colitis in her maternal grandfather; Diabetes in her brother; Heart attack in her brother, brother, brother, and father; Heart disease in her father; Stroke in her brother and mother.  Wt Readings from Last 3 Encounters:  10/06/17 191 lb 12.8 oz (87 kg)  09/09/17 186 lb (84.4 kg)  09/04/17 190 lb (86.2 kg)    PHYSICAL EXAM BP (!) 143/66   Pulse 81   Ht 5\' 8"  (1.727 m)   Wt 191 lb 12.8 oz (87 kg)   SpO2 94%   BMI 29.16 kg/m  Physical Exam  Constitutional: She is oriented to person, place, and time. She appears well-developed and well-nourished. No distress (Tired and ill-appearing. But not toxic.).  HENT:  Head: Normocephalic and atraumatic.  Neck: Normal range of motion. Neck supple. No hepatojugular reflux and no JVD present. Carotid bruit is not present (Cannot exclude soft bruit on the right).  Cardiovascular: Normal rate, regular rhythm, S1 normal, S2 normal and intact distal pulses.  Occasional extrasystoles are present. PMI is not displaced. Exam reveals gallop, S4 and distant heart sounds.  Murmur heard.  Medium-pitched harsh crescendo-decrescendo early systolic murmur is present with a grade of 1/6 at the upper right sternal border radiating to the neck. Pulmonary/Chest: Effort normal and breath sounds normal. No respiratory distress. She has no wheezes. She has no rales.  Abdominal: Soft. Bowel sounds are normal. She exhibits no distension. There is no tenderness. There is no rebound.  Musculoskeletal: Normal range of motion. She exhibits edema (Trivial).  Neurological: She is alert and oriented to person, place, and time.  Skin: Skin is warm and dry.  Psychiatric: She has a normal mood and affect. Her behavior is normal. Judgment and thought content normal.  Answers questions appropriately  Nursing note and vitals reviewed.   Adult ECG Report Normal sinus  rhythm, rate 77 bpm.  Nonspecific ST and T wave changes.  Otherwise normal EKG.  No significant change.  Other studies Reviewed: Additional studies/ records that were reviewed today include:  Recent Labs:    Lab Results  Component Value Date   CHOL 238 (H) 05/07/2017   HDL 35 (L) 05/07/2017   LDLCALC 130 (H) 05/07/2017   TRIG 365 (H) 05/07/2017   CHOLHDL 6.8 (H) 05/07/2017   Lab Results  Component Value Date   CREATININE 0.82 09/09/2017   BUN 21 (H) 09/09/2017   NA 135 09/09/2017   K 3.3 (L) 09/09/2017   CL 98 (L) 09/09/2017   CO2 26 09/09/2017    ASSESSMENT / PLAN: Problem List Items Addressed This Visit    CAD S/P percutaneous coronary angioplasty: pRCA BMS, mLAD BMS overlapped prox with DES for ISR - Primary (Chronic)    Thankfully, she seems to be stabilized out from a chest discomfort standpoint.  She has been evaluated several times that have been negative for ischemia.  She probably does have some microvascular ischemia, but I suspect her symptoms may be more related to GI issues. Continue current dose of beta-blocker along with ARB for afterload reduction. Continue Plavix along with diuretics  She is statin intolerant.  We are working on options.      Relevant Medications   furosemide (  LASIX) 80 MG tablet   carvedilol (COREG) 6.25 MG tablet   Other Relevant Orders   Comprehensive metabolic panel   Celiac artery stenosis: 60% by Duplex - 90-95% by cath - Med Rx (Chronic)    Redo angioplasty recently.  Did not seem to help her symptoms.  Remains on Plavix.      Relevant Medications   furosemide (LASIX) 80 MG tablet   carvedilol (COREG) 6.25 MG tablet   Congestive heart failure with LV diastolic dysfunction, NYHA class 2 (HCC) (Chronic)    This may very well be a major component of her dyspnea.  She does not have any orthopnea or PND symptoms, simply which means that her edema may very well be related to venous stasis as opposed to cardiac etiology. I added  spironolactone for combination of antihypertensive effect as well as to help her hypokalemia issues.  I want to continue the spironolactone, but would also suggest that she is okay for her to take the additional dose of furosemide if necessary.  She is on carvedilol along with losartan for afterload reduction.      Relevant Medications   furosemide (LASIX) 80 MG tablet   carvedilol (COREG) 6.25 MG tablet   Dyslipidemia, goal LDL below 70 - WITH STATIN INTOLERANCE (Chronic)    She remains reluctant to try any oral medications.  She had symptoms with statins as well as Zetia.  There were issues with her co-pay of Repatha/Praluent options, but now that repayment scale has changed, will reconsult [facility] (pharmacist run lipid clinic) to determine if she potentially would be L to qualify for Repatha or Praluent.  We will need to have lipids checked soon as they were last checked in September.  LDL at that time was 130; I would have expected that with her significant GI issues and weight loss due to poor p.o. intake, that her lipid panel would have actually improved.      Relevant Medications   furosemide (LASIX) 80 MG tablet   carvedilol (COREG) 6.25 MG tablet   Other Relevant Orders   Lipid panel   Comprehensive metabolic panel   Essential hypertension (Chronic)    Borderline hypotensive today, however with her having some positional dizziness in the past, titrate up her ARB further.  She has not tolerated in the past, nor has she really tolerated increased dose of carvedilol. --Okay to use additional dose of carvedilol for palpitations.      Relevant Medications   furosemide (LASIX) 80 MG tablet   carvedilol (COREG) 6.25 MG tablet   Hypokalemia    Chemistry panel looked much better, still could benefit from more potassium supplementation based on her January labs. Will increase her potassium supplementation to total of 40 mg daily, we have switched to capsules for ease of swallowing.       Relevant Orders   Comprehensive metabolic panel   MI (myocardial infarction) ([place]) (Chronic)    By report, apparently she had a postop MI, probably resulting from demand ischemia.  Not secondary to true non-STEMI/ACS.  No evidence of infarct noted on her nuclear stress test.  Also no wall motion normalities noted on echocardiogram.      Relevant Medications   furosemide (LASIX) 80 MG tablet   carvedilol (COREG) 6.25 MG tablet   Obesity (BMI 30.0-34.9) (Chronic)    With continued unintentional weight loss, she is now below the threshold for "obesity ".  Not a great way to have a diet, but would hope to see  some benefits.      Palpitation (Chronic)    Relatively well controlled now.  Probably more related to anxiety issues and GI issues.  Continue current dose of carvedilol, but okay to use additional dose as needed for palpitations.      Relevant Orders   Comprehensive metabolic panel   Renal artery stenosis (HCC) (Chronic)    This is been followed up here, however now that she is seeing Dr. Trula Slade for mesenteric and celiac artery stenosis, it would make more sense to have her renal artery stenosis followed along with her mesenteric arteries.  --She is due to have mesenteric artery evaluation coming up soon, perhaps they can simply scan the renal arteries as well.  Will defer to Dr. Trula Slade.      Relevant Medications   furosemide (LASIX) 80 MG tablet   carvedilol (COREG) 6.25 MG tablet   Other Relevant Orders   VAS US RENAL ARTERY DUPLEX   Stasis edema of bilateral lower extremity (Chronic)    In the absence of PND and orthopnea, her swelling is probably more related to venous stasis than heart failure.  She is having a hard time putting her support stockings on, I continue to reiterate the importance of support stockings.  Feet elevation when possible.  But also the judicious use of additional dose of Lasix.         Current medicines are reviewed at length with the patient  today. (+/- concerns) n/a The following changes have been made: n/a  Patient Instructions  Medications  Micro -k  Capsules 32meq  Take 2 capsules ( equal 20 meq) twice a day,  May take an extra carvedilol 6.25mg  in addition to regular dose, if palpation occur  Okay to take a 2nd dose of lasix if swelling is bad along with spirolactone.   Labs CMP Lipid SCHEDULE -Your physician recommends that you schedule a follow-up appointment in: WITH CVRR- AFTER LABS   Schedule on July 15.2019  AT [facility] [place] Your physician has requested that you have a renal artery duplex. During this test, an ultrasound is used to evaluate blood flow to the kidneys. Allow one hour for this exam. Do not eat after midnight the day before and avoid carbonated beverages. Take your medications as you usually do.       Studies Ordered:   Orders Placed This Encounter  Procedures  . Lipid panel  . Comprehensive metabolic panel      Glenetta Hew, M.D., M.S. Interventional Cardiologist   Pager # [phone] Phone # [phone] [address] [place], [place] [address]

## 2017-10-06 NOTE — Patient Instructions (Signed)
Medications  Micro -k  Capsules 40meq  Take 2 capsules ( equal 20 meq) twice a day,  May take an extra carvedilol 6.25mg  in addition to regular dose, if palpation occur  Okay to take a 2nd dose of lasix if swelling is bad along with spirolactone.   Labs CMP Lipid SCHEDULE -Your physician recommends that you schedule a follow-up appointment in: WITH CVRR- AFTER LABS   Schedule on July 15.2019  AT [facility] [place] Your physician has requested that you have a renal artery duplex. During this test, an ultrasound is used to evaluate blood flow to the kidneys. Allow one hour for this exam. Do not eat after midnight the day before and avoid carbonated beverages. Take your medications as you usually do.

## 2017-10-08 ENCOUNTER — Encounter: Payer: Self-pay | Admitting: Cardiology

## 2017-10-08 NOTE — Assessment & Plan Note (Signed)
In the absence of PND and orthopnea, her swelling is probably more related to venous stasis than heart failure.  She is having a hard time putting her support stockings on, I continue to reiterate the importance of support stockings.  Feet elevation when possible.  But also the judicious use of additional dose of Lasix.

## 2017-10-08 NOTE — Assessment & Plan Note (Signed)
Redo angioplasty recently.  Did not seem to help her symptoms.  Remains on Plavix.

## 2017-10-08 NOTE — Assessment & Plan Note (Signed)
Chemistry panel looked much better, still could benefit from more potassium supplementation based on her January labs. Will increase her potassium supplementation to total of 40 mg daily, we have switched to capsules for ease of swallowing.

## 2017-10-08 NOTE — Assessment & Plan Note (Signed)
Relatively well controlled now.  Probably more related to anxiety issues and GI issues.  Continue current dose of carvedilol, but okay to use additional dose as needed for palpitations.

## 2017-10-08 NOTE — Assessment & Plan Note (Signed)
With continued unintentional weight loss, she is now below the threshold for "obesity ".  Not a great way to have a diet, but would hope to see some benefits.

## 2017-10-08 NOTE — Assessment & Plan Note (Addendum)
This is been followed up here, however now that she is seeing Dr. Trula Slade for mesenteric and celiac artery stenosis, it would make more sense to have her renal artery stenosis followed along with her mesenteric arteries.  --She is due to have mesenteric artery evaluation coming up soon, perhaps they can simply scan the renal arteries as well.  Will defer to Dr. Trula Slade.

## 2017-10-08 NOTE — Assessment & Plan Note (Signed)
By report, apparently she had a postop MI, probably resulting from demand ischemia.  Not secondary to true non-STEMI/ACS.  No evidence of infarct noted on her nuclear stress test.  Also no wall motion normalities noted on echocardiogram.

## 2017-10-08 NOTE — Assessment & Plan Note (Signed)
This may very well be a major component of her dyspnea.  She does not have any orthopnea or PND symptoms, simply which means that her edema may very well be related to venous stasis as opposed to cardiac etiology. I added spironolactone for combination of antihypertensive effect as well as to help her hypokalemia issues.  I want to continue the spironolactone, but would also suggest that she is okay for her to take the additional dose of furosemide if necessary.  She is on carvedilol along with losartan for afterload reduction.

## 2017-10-08 NOTE — Assessment & Plan Note (Signed)
She remains reluctant to try any oral medications.  She had symptoms with statins as well as Zetia.  There were issues with her co-pay of Repatha/Praluent options, but now that repayment scale has changed, will reconsult [facility] (pharmacist run lipid clinic) to determine if she potentially would be L to qualify for Repatha or Praluent.  We will need to have lipids checked soon as they were last checked in September.  LDL at that time was 130; I would have expected that with her significant GI issues and weight loss due to poor p.o. intake, that her lipid panel would have actually improved.

## 2017-10-08 NOTE — Assessment & Plan Note (Signed)
Borderline hypotensive today, however with her having some positional dizziness in the past, titrate up her ARB further.  She has not tolerated in the past, nor has she really tolerated increased dose of carvedilol. --Okay to use additional dose of carvedilol for palpitations.

## 2017-10-08 NOTE — Assessment & Plan Note (Signed)
Thankfully, she seems to be stabilized out from a chest discomfort standpoint.  She has been evaluated several times that have been negative for ischemia.  She probably does have some microvascular ischemia, but I suspect her symptoms may be more related to GI issues. Continue current dose of beta-blocker along with ARB for afterload reduction. Continue Plavix along with diuretics  She is statin intolerant.  We are working on options.

## 2017-10-17 DIAGNOSIS — W230XXD Caught, crushed, jammed, or pinched between moving objects, subsequent encounter: Secondary | ICD-10-CM | POA: Diagnosis not present

## 2017-10-17 DIAGNOSIS — S62174D Nondisplaced fracture of trapezium [larger multangular], right wrist, subsequent encounter for fracture with routine healing: Secondary | ICD-10-CM | POA: Diagnosis not present

## 2017-10-17 DIAGNOSIS — R948 Abnormal results of function studies of other organs and systems: Secondary | ICD-10-CM | POA: Diagnosis not present

## 2017-10-17 DIAGNOSIS — M19041 Primary osteoarthritis, right hand: Secondary | ICD-10-CM | POA: Diagnosis not present

## 2017-10-17 DIAGNOSIS — M79641 Pain in right hand: Secondary | ICD-10-CM | POA: Diagnosis not present

## 2017-10-17 DIAGNOSIS — M79644 Pain in right finger(s): Secondary | ICD-10-CM | POA: Diagnosis not present

## 2017-10-19 ENCOUNTER — Other Ambulatory Visit: Payer: Self-pay | Admitting: Cardiology

## 2017-10-22 DIAGNOSIS — S60221A Contusion of right hand, initial encounter: Secondary | ICD-10-CM | POA: Diagnosis not present

## 2017-10-22 DIAGNOSIS — M13841 Other specified arthritis, right hand: Secondary | ICD-10-CM | POA: Diagnosis not present

## 2017-12-02 ENCOUNTER — Telehealth: Payer: Self-pay | Admitting: Cardiology

## 2017-12-02 NOTE — Telephone Encounter (Signed)
Pt called asking to speak with Nurse [name] in regards to her medication Metolazone. She states that she is having difficult paying for the medication and wanted to see if there was a different medication that she may be able to take or if coupons were avilable. She reports increased swelling in feet and legs. She is currently SOB but at her baseline. Pt educated on fluid restriction and sodium intake. Pt also inquiring about whether Dr. Ellyn Hack is accepting new pts at this time for her sister. Call routed.

## 2017-12-02 NOTE — Telephone Encounter (Signed)
New Message:    Pt says she would like for you to call her when you have time.She says she wanted to talk to you only.

## 2017-12-04 ENCOUNTER — Ambulatory Visit: Payer: Medicare Other

## 2017-12-04 MED ORDER — INDAPAMIDE 2.5 MG PO TABS: 2.5000 mg | ORAL_TABLET | Freq: Every day | ORAL | 6 refills | Status: DC

## 2017-12-04 NOTE — Telephone Encounter (Signed)
Patient uses [employer] for medication coverage. Unknown exact coverage.  Will recommend trying Indapamide 1.25mg  in place of metolazone

## 2017-12-04 NOTE — Telephone Encounter (Signed)
Spoke to patient. Information given  changing  Metolazone  To indapamide 1.25mg . Aware to take the same way she was taking previous medications.   30 day supply sent to pharmacy with refills  patient aware to call office if prescription is expensive

## 2017-12-04 NOTE — Telephone Encounter (Signed)
Spoke to patient . She states that  Metolazone has move to another tier and it is over $200.   she states that sis still have some swelling -  And becomes short of breath.   at the present time she continue to take furosemide 80 mg twice a day    spirolactone on mon, wed, fridays      Patient aware will send to DR [facility] and pharmacist   per Dr Ellyn Hack - would like recommendation from pharmacist of another medication possible less expensive  - loop diuretic ?

## 2017-12-05 NOTE — Telephone Encounter (Signed)
Lets hope that this will help.  Glenetta Hew, MD

## 2017-12-13 ENCOUNTER — Other Ambulatory Visit: Payer: Self-pay | Admitting: Cardiology

## 2017-12-15 NOTE — Telephone Encounter (Signed)
Rx has been sent to the pharmacy electronically. ° °

## 2017-12-17 DIAGNOSIS — T887XXA Unspecified adverse effect of drug or medicament, initial encounter: Secondary | ICD-10-CM | POA: Diagnosis not present

## 2017-12-17 DIAGNOSIS — L509 Urticaria, unspecified: Secondary | ICD-10-CM | POA: Diagnosis not present

## 2017-12-19 DIAGNOSIS — E1142 Type 2 diabetes mellitus with diabetic polyneuropathy: Secondary | ICD-10-CM | POA: Diagnosis not present

## 2017-12-19 DIAGNOSIS — R21 Rash and other nonspecific skin eruption: Secondary | ICD-10-CM | POA: Diagnosis not present

## 2017-12-22 ENCOUNTER — Other Ambulatory Visit: Payer: Self-pay | Admitting: Cardiology

## 2017-12-22 NOTE — Telephone Encounter (Signed)
Rx request sent to pharmacy.  

## 2017-12-30 DIAGNOSIS — L03032 Cellulitis of left toe: Secondary | ICD-10-CM | POA: Diagnosis not present

## 2017-12-30 DIAGNOSIS — M79675 Pain in left toe(s): Secondary | ICD-10-CM | POA: Insufficient documentation

## 2018-01-12 DIAGNOSIS — M25562 Pain in left knee: Secondary | ICD-10-CM | POA: Diagnosis not present

## 2018-02-03 ENCOUNTER — Telehealth: Payer: Self-pay | Admitting: Cardiology

## 2018-02-03 NOTE — Telephone Encounter (Signed)
New Message:   Please call,pt says she wants to talk to you(Sharon)only.

## 2018-02-03 NOTE — Telephone Encounter (Signed)
See note under [name] husband.

## 2018-03-16 ENCOUNTER — Encounter: Payer: Self-pay | Admitting: Family

## 2018-03-16 ENCOUNTER — Ambulatory Visit (HOSPITAL_COMMUNITY)
Admission: RE | Admit: 2018-03-16 | Discharge: 2018-03-16 | Disposition: A | Discharge status: Home / Self Care | Payer: Medicare Other | Source: Ambulatory Visit | Attending: Family | Admitting: Family

## 2018-03-16 ENCOUNTER — Ambulatory Visit (INDEPENDENT_AMBULATORY_CARE_PROVIDER_SITE_OTHER)
Admission: RE | Admit: 2018-03-16 | Discharge: 2018-03-16 | Disposition: A | Discharge status: Home / Self Care | Payer: Medicare Other | Source: Ambulatory Visit | Attending: Family | Admitting: Family

## 2018-03-16 ENCOUNTER — Ambulatory Visit (INDEPENDENT_AMBULATORY_CARE_PROVIDER_SITE_OTHER): Payer: Medicare Other | Admitting: Family

## 2018-03-16 ENCOUNTER — Other Ambulatory Visit: Payer: Self-pay

## 2018-03-16 VITALS — BP 145/68 | HR 67 | Temp 97.8°F | Resp 16 | Ht 68.0 in | Wt 188.0 lb

## 2018-03-16 DIAGNOSIS — I701 Atherosclerosis of renal artery: Secondary | ICD-10-CM

## 2018-03-16 DIAGNOSIS — Z9862 Peripheral vascular angioplasty status: Secondary | ICD-10-CM | POA: Diagnosis not present

## 2018-03-16 DIAGNOSIS — I771 Stricture of artery: Secondary | ICD-10-CM

## 2018-03-16 DIAGNOSIS — I774 Celiac artery compression syndrome: Secondary | ICD-10-CM

## 2018-03-16 NOTE — Patient Instructions (Addendum)
Before your next abdominal ultrasound:  Avoid gas forming foods and beverages the day before the test.   Take two Extra-Strength Gas-X capsules at bedtime the night before the test. Take another two Extra-Strength Gas-X capsules in the middle of the night if you get up to the restroom, if not, first thing in the morning with water.        Chronic Mesenteric Ischemia Mesenteric ischemia is poor blood flow (circulation) in the vessels that supply blood to the stomach, intestines, and liver (mesenteric organs). Chronic mesenteric ischemia, also called mesenteric angina or intestinal angina, is a long-term (chronic) condition. It happens when an artery or vein that provides blood to the mesenteric organs gradually becomes blocked or narrow, restricting the blood supply to the organs. When the blood supply is severely restricted, the mesenteric organs cannot work properly. What are the causes? This condition is commonly caused by fatty deposits that build up in an artery (plaque), which can narrow the artery and restrict blood flow. Other causes include:  Weakened areas in blood vessel walls (aneurysms).  Conditions that cause twisting or inflammation of blood vessels, such as fibromuscular dysplasia or arteritis.  A disorder in which blood clots form in the veins (venous thrombosis).  Scarring and thickening (fibrosis) of blood vessels caused by radiation therapy.  A tear in the aorta, the body's main artery (aortic dissection).  Blood vessel problems after illegal drug use, such as use of cocaine.  Tumors in the nervous system (neurofibromatosis).  Certain autoimmune diseases, such as lupus.  What increases the risk? The following factors may make you more likely to develop this condition:  Being female.  Being over age 41, especially if you have a history of heart problems.  Smoking.  Congestive heart failure.  Irregular heartbeat (arrhythmia).  Having a history of heart  attack or stroke.  Diabetes.  High cholesterol.  High blood pressure (hypertension).  Being overweight or obese.  Kidney disease (renal disease) requiring dialysis.  What are the signs or symptoms? Symptoms of this condition include:  Abdomen (abdominal) pain or cramps that develop 15-60 minutes after a meal. This pain may last for 1-3 hours. Some people may develop a fear of eating because of this symptom.  Weight loss.  Diarrhea.  Bloody stool.  Nausea.  Vomiting.  Bloating.  Abdominal pain after stress or with exercise.  How is this diagnosed? This condition is diagnosed based on:  Your medical history.  A physical exam.  Tests, such as: ? Ultrasound. ? CT scan. ? Blood tests. ? Urine tests. ? An imaging test that involves injecting a dye into your arteries to show blood flow through blood vessels (angiogram). This can help to show if there are any blockages in the vessels that lead to the intestines. ? Passing a small probe through the mouth and into the stomach to measure the output of carbon dioxide (gastric tonometry). This can help to indicate whether there is decreased blood flow to the stomach and intestines.  How is this treated? This condition may be treated with:  Dietary changes such as eating smaller, low-fat, meals more frequently.  Lifestyle changes to treat underlying conditions that contribute to the disease, such as high cholesterol and high blood pressure.  Medicines to reduce blood clotting and increase blood flow.  Surgery to remove the blockage, repair arteries or veins, and restore blood flow. This may involve: ? Angioplasty. This is surgery to widen the affected artery, reduce the blockage, and sometimes insert a  small, mesh tube (stent). ? Bypass surgery. This may be done to go around (bypass) the blockage and reconnect healthy arteries or veins. ? Placing a stent in the affected area. This may be done to help keep blocked arteries  open.  Follow these instructions at home: Eating and drinking  Eat a heart-healthy diet. This includes fresh fruits and vegetables, whole grains, and lean proteins like chicken, fish, eggs, and beans.  Avoid foods that contain a lot of: ? Salt (sodium). ? Sugar. ? Saturated fat (such as red meat). ? Trans fat (such as fried foods).  Stay hydrated. Drink enough fluid to keep your urine clear or pale yellow. Lifestyle  Stay active and get regular exercise as told by your health care provider. Aim for 150 minutes of moderate activity or 75 minutes of vigorous activity a week. Ask your health care provider what activities and forms of exercise are safe for you.  Maintain a healthy weight.  Work with your health care provider to manage your cholesterol.  Manage any other health problems you have, such as high blood pressure, diabetes, or heart rhythm problems.  Do not use any products that contain nicotine or tobacco, such as cigarettes and e-cigarettes. If you need help quitting, ask your health care provider. General instructions  Take over-the-counter and prescription medicines only as told by your health care provider.  Keep all follow-up visits as told by your health care provider. This is important. Contact a health care provider if:  Your symptoms do not improve or they return after treatment.  You have a fever. Get help right away if:  You have severe abdominal pain.  You have severe chest pain.  You have shortness of breath.  You feel weak or dizzy.  You have palpitations.  You have numbness or weakness in your face, arm, or leg.  You are confused.  You have trouble speaking or people have trouble understanding what you are saying.  You are constipated.  You have trouble urinating.  You have blood in your stool.  You have severe nausea, vomiting, or persistent diarrhea. Summary  Mesenteric ischemia is poor circulation in the vessels that supply blood  to the the stomach, intestines, and liver (mesenteric organs).  This condition happens when an artery or vein that provides blood to the mesenteric organs gradually becomes blocked or narrow, restricting the blood supply to the organs.  This condition is commonly caused by fatty deposits that build up in an artery (plaque), which can narrow the artery and restrict blood flow.  You are more likely to develop this condition if you are over age 44 and have a history of heart problems, high blood pressure, diabetes, or high cholesterol.  This condition is usually treated with medicines, dietary and lifestyle changes, and surgery to remove the blockage, repair arteries or veins, and restore blood flow. This information is not intended to replace advice given to you by your health care provider. Make sure you discuss any questions you have with your health care provider. Document Released: 04/08/2011 Document Revised: 08/03/2016 Document Reviewed: 08/03/2016 Elsevier Interactive Patient Education  2017 [employer].

## 2018-03-16 NOTE — Progress Notes (Signed)
CC: Follow up Celiac Artery stenosis   History of Present Illness  [name] is a 77 y.o. (April 26, 1941) female who is s/p angioplasty celiac artery on 09-09-17 by Dr. Trula Slade for celiac artery in-stent stenosis. The patient has a long-standing history of abdominal pain.  As a last ditch effort, her physicians felt treating a 90% celiac artery stenosis may help alleviate some of her symptoms.  This was done in 2018 with a 7 mm balloon expandable stent.  At an office visit before the 09-09-17 angioplasty, ultrasound suggested a return of her stenosis.  She had not had any change in her symptoms.  She returns today for follow up.   The patient has a hx of chronic abdominal pain. She hadexhausted all medical options to relieve her pain. The only thing remaining was her celiac stenosis.She has a long-standing history of abdominal pain. She underwent cholecystectomy at age 22 she is been diagnosed with pancreatic divisum and has had stents placed 20 years ago at [facility]. She continues to have abdominal pain. She has undergone multiple trials which have been unsuccessful with resolving her pain.   Currently she has occasional post prandial abdominal pain, especially after eating salads. And the pp abdominal pain is hit or miss.   Dr. Trula Slade last evaluated pt on 03-03-17. At that time mesenteric ultrasound: Unable to visualize celiac artery. Celiac stenting: Unfortunately, the patient did not have any relief of her abdominal pain after stenting. She will now be on protocol for follow-up. Leg swelling: Venous reflux examination was essentially normal. Dr. Trula Slade counseled the patient to wear compression stockings and return in 6 months for possible evaluation of a Normatec device.  She is not wearing her knee high compression hose due to it being hot out or doors.   Pt does not need a Normatec device; her lower legs swelling has mostly subsided since her significant unintentional weight loss.    She is taking spironolactone and Lasix for this.   She had a left renal artery stent placed by a cardiologist in [facility], and Dr. Ellyn Hack has been checking this with ultrasounds.   Her weight on 09-04-17 was 190 #, today is 188#.   Last serum creatinine result on file was 0.82 on 09-09-17.    Diabetic: states since her weight loss her A1C is below DM threshold Tobacco use: never  She takes Plavix, no ASA. She is statin intolerant.     Past Medical History:  Diagnosis Date  . Arthritis    "fingers, right shoulder" (09/09/2017)  . Atrial fibrillation ([place])   . Back pain with radiation    Chronic Back Pain - mutliple surgeries (including tumor removal)  . Bilateral edema of lower extremity    Chronic, likely related to venous stasis  . CAD S/P percutaneous coronary angioplasty    a) [facility]: 07/30/10. -- 3.0x31mm Integrity BMS to pRCA & 2.5x 60mm Integrity BMS mLAD(@ D2).  b) Class III-IV Angina 03/2011: LHC- ISR in LAD BMS -- prox overlapping Promus DES 2.5x77mm and PTCA of jailed D2 ostium-prox 80%. c) 02/03/12:  L[facility]- patent stents.  Jailed diagonal. with stable flow; d) Peri-OP NSTEMI 04/2012 - LHC in 12/'13 -   . Celiac artery stenosis ([place])    12/2016 - STENT placement  . Complication of anesthesia    "used to wake up wild years ago" (09/09/2017)  . Congestive heart failure with LV diastolic dysfunction, NYHA class 2 ([place])    06/13/10:  last 2D echo-  EF >55%, Mild TR, Mod Conc LVH - Grade 1 diastolic dysfunction (abnormal relaxation) --> LVEDP on Cath 28 mmHg & mild 2nd Pulm HTN  . Diet-controlled type 2 diabetes mellitus ([place])   . Diverticulitis of colon (without mention of hemorrhage)(562.11)   . Dyslipidemia, goal LDL below 70    Intolerant to statins  . GERD (gastroesophageal reflux disease)   . Hepatitis ~ 1957   "yellow jaundice" (01/14/2017)  . Hepatomegaly   . Hiatal hernia   . History of blood transfusion 04/2012   "when I had a heart attack"  . History of kidney stones     "I've got a stone embedded in one of my kidneys" (09/09/2017)  . History of stomach ulcers "years ago"  . Irritable bowel syndrome (IBS)   . Labile essential hypertension    Partially related to RAS  . Mesenteric artery stenosis ([facility])    95% Celiac Artery - ostial, 20-30% SMA.  Bilateral Renal A: L RA stent patent, R RA 20-30% -- Conservative Management  . NSTEMI (non-ST elevated myocardial infarction) ([place]) 04/2012   Unclear the details, apparently this was postoperative from her back surgery that she was cleared for my last saw her in June.  Reportedly had stents placed  . Pancreas divisum    On pancreatic enzyme  . Renal artery stenosis ([place]) 2011; 12/'13   a) Angiogram 02/03/12:  50-60%L RA stenosis, 40% R R Inferior artery; b) 12/'13: S/P L RA Stent ([facility]) 6.0 mm x 15 mm; c) Renal Duplex 10/2013: <60% L RA, <60 R RA, ~60% SMA & Celiac A.  . Stricture and stenosis of esophagus   . Unspecified gastritis and gastroduodenitis without mention of hemorrhage     Social History Social History   Tobacco Use  . Smoking status: Never Smoker  . Smokeless tobacco: Never Used  Substance Use Topics  . Alcohol use: No  . Drug use: No    Family History Family History  Problem Relation Age of Onset  . Cancer Father        mets  . Heart attack Father   . Heart disease Father   . Stroke Mother   . Heart attack Brother   . Heart attack Brother   . Colitis Maternal Grandfather   . Diabetes Brother   . Stroke Brother   . Heart attack Brother   . Colon cancer Neg Hx   . Stomach cancer Neg Hx     Surgical History Past Surgical History:  Procedure Laterality Date  . APPENDECTOMY    . BACK SURGERY    . BREAST DUCTAL SYSTEM EXCISION     right  . BREAST SURGERY Right   . CARDIAC CATHETERIZATION N/A 03/16/2015   Procedure: Left Heart Cath and Coronary Angiography;  Surgeon: Leonie Man, MD;  Location: [place] [facility];  Service: Cardiovascular; Widely patent m-dLAD stents as  wellas pRCA stent.  High LVEDP, small Diag & Om vessels with moderate stenosis   . Cardiac Event Monitor  01/2017   Mostly NSR with occasional sinus tachycardia and rare bradycardia. No A. fib. No PND or PSVT. Rare PACs and PVCs.  Marland Kitchen CARDIOVASCULAR STRESS TEST     11/07/11:  Normal perfusion pattern.  EF 66% No wall motion abnormalities.   Marland Kitchen CATARACT EXTRACTION W/ INTRAOCULAR LENS  IMPLANT, BILATERAL Bilateral   . CHOLECYSTECTOMY OPEN    . COLONOSCOPY  03/01/2009   normal   . CORONARY ANGIOPLASTY WITH STENT PLACEMENT  07/2010, 02/2011  LHC: 07/30/10.  3.0x53mm Integrity BMS to RCA and 2.5x 52mm Integrity BMS LAD.  03/2011: LHC- ISRS in LAD treated with overlapping Promus DES 2.5x22mm and PTCA of Diagonal.  02/03/12:  LHC- patent stents.  Jailed diagonal.  . DOBUTAMINE STRESS ECHO  03/07/2015   [facility] (ordered for pre-op evaluation for EUS/ERCP --> abnormal EKG:  strreesss test shhoowwed 1 mm ST segment depressions downsloping. No wall motion abnormalities at peak exercise or at rest. Diastolic dysfunction was noted but normal systolic function - EF greater than 55%. No bouts of regurgitation or stenosis. Resting hypertension with exaggerated response  . ESOPHAGOGASTRODUODENOSCOPY  04/08/2012  . ESOPHAGOGASTRODUODENOSCOPY (EGD) WITH ESOPHAGEAL DILATION  X 2  . FRACTURE SURGERY    . HAMMER TOE SURGERY Bilateral    "took bone off the top of 2nd toe on each foot"  . HIP SURGERY Left    "something to do w/my back"  . JOINT REPLACEMENT    . KNEE SURGERY Left    "had fluid drained off it a couple times"  . LEFT HEART CATHETERIZATION WITH CORONARY ANGIOGRAM N/A 02/03/2012   Procedure: LEFT HEART CATHETERIZATION WITH CORONARY ANGIOGRAM;  Surgeon: Leonie Man, MD;  Location: [facility] CATH LAB;  Service: Cardiovascular;  Laterality: N/A;  . LEFT HEART CATHETERIZATION WITH CORONARY ANGIOGRAM N/A 05/12/2014   Procedure: LEFT HEART CATHETERIZATION WITH CORONARY ANGIOGRAM;  Surgeon: Lorretta Harp, MD;  Location: [facility]  CATH LAB;  Service: Cardiovascular: Stable CAD. Patent stents. Patent renal artery stent  . LEFT HEART CATHETERIZATION WITH CORONARY ANGIOGRAM  08/2012   Peri-Op MI @ [facility] -- 40% ostial D1, patent LAD stents, 10% RCA ISR  . LEFT HEART CATHETERIZATION WITH CORONARY ANGIOGRAM   03/2012   ~30% ISR RCA, patent LAD stent & D2; EDP ~28 mmHg  . NM MYOVIEW LTD  12/2016   LOW RISK study. No ischemia or infarction. EF 65-75%.  Marland Kitchen PANCREAS SURGERY     "stent in my pancreas"; Dr Pershing Proud  . PERCUTANEOUS PINNING PHALANX FRACTURE OF HAND Left ~ 2013  . PERIPHERAL VASCULAR BALLOON ANGIOPLASTY  09/09/2017   Procedure: PERIPHERAL VASCULAR BALLOON ANGIOPLASTY;  Surgeon: Serafina Mitchell, MD;  Location: [place] [facility];  Service: Cardiovascular;;  Celiac instent  . PERIPHERAL VASCULAR INTERVENTION  01/14/2017   Procedure: Peripheral Vascular Intervention;  Surgeon: Serafina Mitchell, MD;  Location: [place] [facility];  Service: Cardiovascular;;  mesentric  . RENAL ARTERY STENT Left 08/2012   @ [facility] - 6.0 mm x 15 mm  . SPINE SURGERY     tumor removed 07/2010; Redo Surgery 04/2012; Sacroiliac Sgx 08/2013  . TOTAL ABDOMINAL HYSTERECTOMY    . TOTAL KNEE ARTHROPLASTY Left ~ 2012  . VISCERAL ANGIOGRAM N/A 05/12/2014   Procedure: VISCERAL ANGIOGRAM;  Surgeon: Lorretta Harp, MD;  Location: [facility] CATH LAB;  Service: Cardiovascular;  Laterality: N/A;  . VISCERAL ANGIOGRAPHY N/A 01/14/2017   Procedure: Mesenteric  Angiography;  Surgeon: Serafina Mitchell, MD;  Location: [place] [facility];  Service: Cardiovascular;  Laterality: N/A;  . VISCERAL ANGIOGRAPHY N/A 09/09/2017   Procedure: VISCERAL ANGIOGRAPHY;  Surgeon: Serafina Mitchell, MD;  Location: [place] [facility];  Service: Cardiovascular;  Laterality: N/A;    Allergies  Allergen Reactions  . Codeine Phosphate Anaphylaxis, Shortness Of Breath and Swelling  . Contrast Media [Iodinated Diagnostic Agents]     swelling  . Iodine Swelling and  Rash    REACTION: unspecified This allergy is to  Topical iodine only.   . Prednisone Itching and Rash    Keeps pt up all night   . Statins Other (See Comments)    Intolerant   . Tramadol Other (See Comments)    Kept patient awake    Current Outpatient Medications  Medication Sig Dispense Refill  . acetaminophen (TYLENOL) 500 MG tablet Take 2,000 mg by mouth 4 (four) times daily as needed for moderate pain.     Marland Kitchen amitriptyline (ELAVIL) 25 MG tablet TAKE 1 TABLET(25 MG) BY MOUTH AT BEDTIME (Patient taking differently: TAKE 1 TABLET(25 MG) BY MOUTH AT BEDTIME AS NEEDED FOR SLEEP) 30 tablet 3  . Artificial Tear Solution (TEARS RENEWED OP) Apply 1 drop to eye daily as needed (dry eyes).    . carvedilol (COREG) 6.25 MG tablet Take 1 tablet (6.25 mg total) by mouth 2 (two) times daily with a meal. May take an extra tablet daily if having palpations 90 tablet 6  . clopidogrel (PLAVIX) 75 MG tablet TAKE 1 TABLET(75 MG) BY MOUTH DAILY 30 tablet 12  . furosemide (LASIX) 80 MG tablet Take 80 mg by mouth.    . indapamide (LOZOL) 2.5 MG tablet Take 1 tablet (2.5 mg total) by mouth daily. And take 30 min before daily furosemide 30 tablet 6  . isosorbide mononitrate (IMDUR) 60 MG 24 hr tablet TAKE 1 TABLET(60 MG) BY MOUTH DAILY 90 tablet 1  . lidocaine (XYLOCAINE) 5 % ointment Apply 1 application topically at bedtime as needed (toe pain).     . Lidocaine 4 % PTCH Apply 1 patch topically daily as needed (pain).    Marland Kitchen losartan (COZAAR) 50 MG tablet TAKE 1 TABLET(50 MG) BY MOUTH DAILY 90 tablet 2  . metolazone (ZAROXOLYN) 2.5 MG tablet TAKE 1 TO 2 TABLETS BY MOUTH 30 TO 60 MINUTES PRIOR TO MORNING DOSE OF LASIX AS NEEDED 180 tablet 1  . nitroGLYCERIN (NITROSTAT) 0.4 MG SL tablet DISSOLVE 1 TABLET UNDER THE TONGUE EVERY 5 MINUTES AS NEEDED FOR CHEST PAIN 25 tablet 4  . potassium chloride (MICRO-K) 10 MEQ CR capsule Take 2 capsules (20 mEq total) by mouth 2 (two) times daily. 120 capsule 6  . spironolactone  (ALDACTONE) 25 MG tablet Take 1 tablet (25 mg total) by mouth every Monday, Wednesday, and Friday at 6 PM. 45 tablet 3   No current facility-administered medications for this visit.     ROS: see HPI for pertinent positives and negatives    Physical Examination  Vitals:   03/16/18 0920 03/16/18 0926  BP: (!) 141/63 (!) 145/68  Pulse: 67 67  Resp: 16   Temp: 97.8 F (36.6 C)   TempSrc: Oral   SpO2: 97%   Weight: 188 lb (85.3 kg)   Height: 5\' 8"  (1.727 m)    Body mass index is 28.59 kg/m.    General: A&O x 3, WDWN, overweight female. HEENT: no gross abnormalities  Pulmonary: Sym exp, respirations are non labored, good air movt, CTAB, no rales, rhonchi, or wheezing. Cardiac: RRR, Nl S1, S2, no detected murmur.  Vascular: Vessel Right Left  Radial 1+Palpable 1+Palpable  Carotid  without bruit  without bruit  Aorta Not palpable N/A  Femoral 2+Palpable 2+Palpable  Popliteal 2+palpable 2+ palpable  PT 2+Palpable 2+Palpable  DP 1+Palpable 2+ Palpable   Gastrointestinal: soft, ND, mildly tender to palpation in all quadrants, -G/R, - HSM, - palpable masses, - CVAT. Musculoskeletal: M/S 5/5 throughout, Extremities without ischemic changes. Skin: No rash, no cellulitis, no ulcers noted.  Neurologic: Pain and light touch intact in extremities, Motor exam as listed above. CN 2-12 intact.  Psychiatric: Normal thought content, mood appropriate to clinical situation.     DATA  Mesenteric Duplex (Date: 03/16/2018):   Ao: 142 cm/s PSV, 0 EDV  Celiac artery: 247 PSV, 37 EDV (was >400 PSV on 09-04-17)  SMA: 296 PSV, 29 EDV (was 283 cm/s PSV on 09-04-17)   Renal Artery Duplex (03-16-18): Technically limited by body habitus and bowel gas.  Right:There appears to be a second renal artery. Unable to visualize the proximal right renal artery. No renal artery stenosis in the visualized segments. Kidney length at the low end of normal.  Left: >60% stenosis in the left renal artery  origin (418 cm/s PSV) Kidney length at low end of normal.    February 2018 Renal artery duplex requested by Dr. Johnsie Cancel: Technically challenging study. Normal caliber abdominal aorta. >50% distal aorta stenosis. >70% celiac and SMA stenosis. Normal bilateral kidney size. Normal bilateral renal arteries, s/p left renal artery stent. The IVC and renal veins are patent   Medical Decision Making  [name] is a 77 y.o. female who is s/p celiac artery stenting on 01/14/2017. She continued to have abdominal pain after this. Currently she has occasional post prandial abdominal pain, especially after eating salads. And the pp abdominal pain is hit or miss.    She had a left renal artery stent placed by a cardiologist in [facility]. Her blood pressure has been in fairly good control.  05-27-17 serum creatinine was 0.93  She weighed 190 # on 09-09-17, 188# today.  She takes Plavix, no ASA. She is statin intolerant.     Based on her exam and studies, and after discussing with Dr. Trula Slade, I have offered the patient return in 6 months with bilateral renal artery and mesenteric artery duplex, see me afterward on a day that Dr. Trula Slade is in the office.  I discussed in depth with the patient the nature of atherosclerosis, and emphasized the importance of maximal medical management including strict control of blood pressure, blood glucose, and lipid levels, obtaining regular exercise, and cessation of smoking.    The patient is aware that without maximal medical management the underlying atherosclerotic disease process will progress, limiting the benefit of any interventions. .   Thank you for allowing [place] to participate in this patient's care.  Clemon Chambers, RN, MSN, FNP-C [facility] Office: [phone]  Clinic MD: Trula Slade  03/16/2018, 9:30 AM

## 2018-03-27 ENCOUNTER — Other Ambulatory Visit: Payer: Self-pay | Admitting: Cardiology

## 2018-03-27 NOTE — Telephone Encounter (Signed)
Rx request sent to pharmacy.  

## 2018-04-06 ENCOUNTER — Other Ambulatory Visit: Payer: Self-pay | Admitting: Cardiology

## 2018-04-13 ENCOUNTER — Other Ambulatory Visit: Payer: Self-pay

## 2018-04-13 DIAGNOSIS — I701 Atherosclerosis of renal artery: Secondary | ICD-10-CM

## 2018-04-13 DIAGNOSIS — K551 Chronic vascular disorders of intestine: Secondary | ICD-10-CM

## 2018-04-13 DIAGNOSIS — I771 Stricture of artery: Secondary | ICD-10-CM

## 2018-04-13 DIAGNOSIS — I774 Celiac artery compression syndrome: Secondary | ICD-10-CM

## 2018-04-23 ENCOUNTER — Ambulatory Visit (INDEPENDENT_AMBULATORY_CARE_PROVIDER_SITE_OTHER): Payer: Medicare Other | Admitting: Cardiology

## 2018-04-23 ENCOUNTER — Encounter: Payer: Self-pay | Admitting: Cardiology

## 2018-04-23 VITALS — BP 127/72 | HR 82 | Ht 68.0 in | Wt 191.0 lb

## 2018-04-23 DIAGNOSIS — I701 Atherosclerosis of renal artery: Secondary | ICD-10-CM

## 2018-04-23 DIAGNOSIS — E785 Hyperlipidemia, unspecified: Secondary | ICD-10-CM

## 2018-04-23 DIAGNOSIS — I87302 Chronic venous hypertension (idiopathic) without complications of left lower extremity: Secondary | ICD-10-CM | POA: Diagnosis not present

## 2018-04-23 DIAGNOSIS — I2510 Atherosclerotic heart disease of native coronary artery without angina pectoris: Secondary | ICD-10-CM | POA: Diagnosis not present

## 2018-04-23 DIAGNOSIS — Z9861 Coronary angioplasty status: Secondary | ICD-10-CM | POA: Diagnosis not present

## 2018-04-23 DIAGNOSIS — R0609 Other forms of dyspnea: Secondary | ICD-10-CM | POA: Diagnosis not present

## 2018-04-23 DIAGNOSIS — R0989 Other specified symptoms and signs involving the circulatory and respiratory systems: Secondary | ICD-10-CM | POA: Diagnosis not present

## 2018-04-23 DIAGNOSIS — I10 Essential (primary) hypertension: Secondary | ICD-10-CM | POA: Diagnosis not present

## 2018-04-23 DIAGNOSIS — R002 Palpitations: Secondary | ICD-10-CM | POA: Diagnosis not present

## 2018-04-23 DIAGNOSIS — I5030 Unspecified diastolic (congestive) heart failure: Secondary | ICD-10-CM

## 2018-04-23 DIAGNOSIS — E669 Obesity, unspecified: Secondary | ICD-10-CM

## 2018-04-23 NOTE — Patient Instructions (Signed)
No medication changes  MAY USE AN ADDITIONAL DOSE OF CARVEDILOL IF NEED FOR PALPATIONS      Your physician wants you to follow-up in [place].You will receive a reminder letter in the mail two months in advance. If you don't receive a letter, please call our office to schedule the follow-up appointment.   If you need a refill on your cardiac medications before your next appointment, please call your pharmacy.

## 2018-04-23 NOTE — Progress Notes (Signed)
PCP: Kristopher Glee., MD  Clinic Note: Chief Complaint  Patient presents with  . Follow-up    No complaints  . Coronary Artery Disease    HPI: [name] is a 77 y.o. female with a PMH below who presents today for 2 month f/u (was supposed to see CVRR) -but now presents with complaints of palpitations.[name] has a history of known CAD-PCI as well as chronic diastolic dysfunction and PAD /splanchnic artery disease.  CAD: pRCA & mLAD x 2 in 2011 (BMS stents used for preop back surgery), DES x 2 LAD for ISR Myoview May 2018: Normal EF (hyperdynamic LV function EF greater than 65%).  LOW RISK.  No ischemia or infarction. PAD: - RAS - s/p renal stent; celiac/SMA stenosis s/p SMA stent (recent)  Also has very difficult to control lipids with statin intolerance and refusal to take other medications. - Has been evaluated by our [place] team - but stopped going  Chronic edema at least in part partially related to diastolic dysfunction, also related to venous insufficiency. - Usually controlled with Lasix and metolazone. She has renal artery & Celiac/SMA disease - sp Celiac & Renal Artery stenting.  [name] was last seen on 10/06/2017.  Doing fairly well.  Not really noticed that much the way of palpitations.  GI symptoms are actually getting better.  Still notes exertional dyspnea but not really chest discomfort.  Despite all of this, she remains very active doing all of her household chores and cooking.  Recent Hospitalizations:   N/A  Studies Personally Reviewed - (if available, images/films reviewed: From Epic Chart or Care Everywhere)  NONE  Interval History:  [name] returns today actually doing quite well overall cardiac standpoint.  She stays busy all the time and is quite happy right now.  She says she has off-and-on palpitations but they are still doing pretty well.  Her weight has stabilized now and she is actually finally able to keep food down but her GI symptoms  stabilized somewhat.  She really says that she has not had any issues with chest discomfort or significant dyspnea of late.  Remains active.  She did lots of canning of soups & vegetables along with baking / bread making over the last few weeks and has done quite well with that. No real PND or orthopnea & edema is well controlled with current diuretic regimen.   No syncope, near syncope or TIA/Amaurosis [place] Sx. Less orthostatic.   She still has her abdominal pain and really does not notice that she had much benefit from her angioplasty of the celiac artery.   ROS: A comprehensive was performed. Review of Systems  Constitutional: Positive for malaise/fatigue. Negative for chills, fever and weight loss (Weight seems to have finally stabilized out).  HENT: Negative for congestion.   Respiratory: Negative for cough, shortness of breath and wheezing.   Cardiovascular:       Per HPI  Gastrointestinal: Positive for abdominal pain, diarrhea and nausea. Negative for blood in stool, melena and vomiting.       Overall GI Sx are more stable / improved.  Genitourinary: Negative for dysuria, frequency and hematuria.  Musculoskeletal: Positive for back pain, joint pain and myalgias.  Neurological: Positive for dizziness (With palpitations, and orthostatic). Negative for weakness.  Psychiatric/Behavioral: The patient has insomnia. The patient is not nervous/anxious.   All other systems reviewed and are negative.   I have reviewed and (if needed) personally updated the patient's problem list,  medications, allergies, past medical and surgical history, social and family history.   Past Medical History:  Diagnosis Date  . Arthritis    "fingers, right shoulder" (09/09/2017)  . Atrial fibrillation ([place])   . Back pain with radiation    Chronic Back Pain - mutliple surgeries (including tumor removal)  . Bilateral edema of lower extremity    Chronic, likely related to venous stasis  . CAD S/P percutaneous  coronary angioplasty    a) LHC: 07/30/10. -- 3.0x40mm Integrity BMS to pRCA & 2.5x 75mm Integrity BMS mLAD(@ D2).  b) Class III-IV Angina 03/2011: LHC- ISR in LAD BMS -- prox overlapping Promus DES 2.5x37mm and PTCA of jailed D2 ostium-prox 80%. c) 02/03/12:  LHC- patent stents.  Jailed diagonal. with stable flow; d) Peri-OP NSTEMI 04/2012 - LHC in 12/'13 -   . Celiac artery stenosis ([place])    12/2016 - STENT placement  . Complication of anesthesia    "used to wake up wild years ago" (09/09/2017)  . Congestive heart failure with LV diastolic dysfunction, NYHA class 2 ([place])    06/13/10:  last 2D echo-  EF >55%, Mild TR, Mod Conc LVH - Grade 1 diastolic dysfunction (abnormal relaxation) --> LVEDP on Cath 28 mmHg & mild 2nd Pulm HTN  . Diet-controlled type 2 diabetes mellitus ([place])   . Diverticulitis of colon (without mention of hemorrhage)(562.11)   . Dyslipidemia, goal LDL below 70    Intolerant to statins  . GERD (gastroesophageal reflux disease)   . Hepatitis ~ 1957   "yellow jaundice" (01/14/2017)  . Hepatomegaly   . Hiatal hernia   . History of blood transfusion 04/2012   "when I had a heart attack"  . History of kidney stones    "I've got a stone embedded in one of my kidneys" (09/09/2017)  . History of stomach ulcers "years ago"  . Irritable bowel syndrome (IBS)   . Labile essential hypertension    Partially related to RAS  . Mesenteric artery stenosis (HCC)    95% Celiac Artery - ostial, 20-30% SMA.  Bilateral Renal A: L RA stent patent, R RA 20-30% -- Conservative Management  . NSTEMI (non-ST elevated myocardial infarction) ([place]) 04/2012   Unclear the details, apparently this was postoperative from her back surgery that she was cleared for my last saw her in June.  Reportedly had stents placed  . Pancreas divisum    On pancreatic enzyme  . Renal artery stenosis ([place]) 2011; 12/'13   a) Angiogram 02/03/12:  50-60%L RA stenosis, 40% R R Inferior artery; b) 12/'13: S/P L RA Stent ([facility])  6.0 mm x 15 mm; c) Renal Duplex 10/2013: <60% L RA, <60 R RA, ~60% SMA & Celiac A.  . Stricture and stenosis of esophagus   . Unspecified gastritis and gastroduodenitis without mention of hemorrhage     Past Surgical History:  Procedure Laterality Date  . APPENDECTOMY    . BACK SURGERY    . BREAST DUCTAL SYSTEM EXCISION     right  . BREAST SURGERY Right   . CARDIAC CATHETERIZATION N/A 03/16/2015   Procedure: Left Heart Cath and Coronary Angiography;  Surgeon: Leonie Man, MD;  Location: [place] [facility];  Service: Cardiovascular; Widely patent m-dLAD stents as wellas pRCA stent.  High LVEDP, small Diag & Om vessels with moderate stenosis   . Cardiac Event Monitor  01/2017   Mostly NSR with occasional sinus tachycardia and rare bradycardia. No A. fib. No PND or PSVT.  Rare PACs and PVCs.  Marland Kitchen CARDIOVASCULAR STRESS TEST     11/07/11:  Normal perfusion pattern.  EF 66% No wall motion abnormalities.   Marland Kitchen CATARACT EXTRACTION W/ INTRAOCULAR LENS  IMPLANT, BILATERAL Bilateral   . CHOLECYSTECTOMY OPEN    . COLONOSCOPY  03/01/2009   normal   . CORONARY ANGIOPLASTY WITH STENT PLACEMENT  07/2010, 02/2011   LHC: 07/30/10.  3.0x53mm Integrity BMS to RCA and 2.5x 61mm Integrity BMS LAD.  03/2011: LHC- ISRS in LAD treated with overlapping Promus DES 2.5x27mm and PTCA of Diagonal.  02/03/12:  LHC- patent stents.  Jailed diagonal.  . DOBUTAMINE STRESS ECHO  03/07/2015   [facility] (ordered for pre-op evaluation for EUS/ERCP --> abnormal EKG:  strreesss test shhoowwed 1 mm ST segment depressions downsloping. No wall motion abnormalities at peak exercise or at rest. Diastolic dysfunction was noted but normal systolic function - EF greater than 55%. No bouts of regurgitation or stenosis. Resting hypertension with exaggerated response  . ESOPHAGOGASTRODUODENOSCOPY  04/08/2012  . ESOPHAGOGASTRODUODENOSCOPY (EGD) WITH ESOPHAGEAL DILATION  X 2  . FRACTURE SURGERY    . HAMMER TOE SURGERY Bilateral    "took bone off the top of  2nd toe on each foot"  . HIP SURGERY Left    "something to do w/my back"  . JOINT REPLACEMENT    . KNEE SURGERY Left    "had fluid drained off it a couple times"  . LEFT HEART CATHETERIZATION WITH CORONARY ANGIOGRAM N/A 02/03/2012   Procedure: LEFT HEART CATHETERIZATION WITH CORONARY ANGIOGRAM;  Surgeon: Leonie Man, MD;  Location: [facility] C[facility];  Service: Cardiovascular;  Laterality: N/A;  . LEFT HEART CATHETERIZATION WITH CORONARY ANGIOGRAM N/A 05/12/2014   Procedure: LEFT HEART CATHETERIZATION WITH CORONARY ANGIOGRAM;  Surgeon: Lorretta Harp, MD;  Location: [facility] CATH LAB;  Service: Cardiovascular: Stable CAD. Patent stents. Patent renal artery stent  . LEFT HEART CATHETERIZATION WITH CORONARY ANGIOGRAM  08/2012   Peri-Op MI @ [facility] -- 40% ostial D1, patent LAD stents, 10% RCA ISR  . LEFT HEART CATHETERIZATION WITH CORONARY ANGIOGRAM   03/2012   ~30% ISR RCA, patent LAD stent & D2; EDP ~28 mmHg  . NM MYOVIEW LTD  12/2016   LOW RISK study. No ischemia or infarction. EF 65-75%.  Marland Kitchen PANCREAS SURGERY     "stent in my pancreas"; Dr Pershing Proud  . PERCUTANEOUS PINNING PHALANX FRACTURE OF HAND Left ~ 2013  . PERIPHERAL VASCULAR BALLOON ANGIOPLASTY  09/09/2017   Procedure: PERIPHERAL VASCULAR BALLOON ANGIOPLASTY;  Surgeon: Serafina Mitchell, MD;  Location: [place] [facility];  Service: Cardiovascular;;  Celiac instent  . PERIPHERAL VASCULAR INTERVENTION  01/14/2017   Procedure: Peripheral Vascular Intervention;  Surgeon: Serafina Mitchell, MD;  Location: [place] [facility];  Service: Cardiovascular;;  mesentric  . RENAL ARTERY STENT Left 08/2012   @ [facility] - 6.0 mm x 15 mm  . SPINE SURGERY     tumor removed 07/2010; Redo Surgery 04/2012; Sacroiliac Sgx 08/2013  . TOTAL ABDOMINAL HYSTERECTOMY    . TOTAL KNEE ARTHROPLASTY Left ~ 2012  . VISCERAL ANGIOGRAM N/A 05/12/2014   Procedure: VISCERAL ANGIOGRAM;  Surgeon: Lorretta Harp, MD;  Location: [facility] CATH LAB;  Service:  Cardiovascular;  Laterality: N/A;  . VISCERAL ANGIOGRAPHY N/A 01/14/2017   Procedure: Mesenteric  Angiography;  Surgeon: Serafina Mitchell, MD;  Location: [place] [facility];  Service: Cardiovascular;  Laterality: N/A;  . VISCERAL ANGIOGRAPHY N/A 09/09/2017  Procedure: VISCERAL ANGIOGRAPHY;  Surgeon: Serafina Mitchell, MD;  Location: [place] [facility];  Service: Cardiovascular;  Laterality: N/A;    Current Meds  Medication Sig  . acetaminophen (TYLENOL) 500 MG tablet Take 2,000 mg by mouth 4 (four) times daily as needed for moderate pain.   Marland Kitchen amitriptyline (ELAVIL) 25 MG tablet TAKE 1 TABLET(25 MG) BY MOUTH AT BEDTIME (Patient taking differently: TAKE 1 TABLET(25 MG) BY MOUTH AT BEDTIME AS NEEDED FOR SLEEP)  . Artificial Tear Solution (TEARS RENEWED OP) Apply 1 drop to eye daily as needed (dry eyes).  . carvedilol (COREG) 6.25 MG tablet Take 1 tablet (6.25 mg total) by mouth 2 (two) times daily with a meal. May take an extra tablet daily if having palpations  . clopidogrel (PLAVIX) 75 MG tablet TAKE 1 TABLET(75 MG) BY MOUTH DAILY  . furosemide (LASIX) 80 MG tablet Take 80 mg by mouth.  . furosemide (LASIX) 80 MG tablet TAKE 1 TABLET(80 MG) BY MOUTH TWICE DAILY  . indapamide (LOZOL) 2.5 MG tablet Take 1 tablet (2.5 mg total) by mouth daily. And take 30 min before daily furosemide  . lidocaine (XYLOCAINE) 5 % ointment Apply 1 application topically at bedtime as needed (toe pain).   . Lidocaine 4 % PTCH Apply 1 patch topically daily as needed (pain).  Marland Kitchen losartan (COZAAR) 50 MG tablet TAKE 1 TABLET(50 MG) BY MOUTH DAILY  . metolazone (ZAROXOLYN) 2.5 MG tablet TAKE 1 TO 2 TABLETS BY MOUTH 30 TO 60 MINUTES PRIOR TO MORNING DOSE OF LASIX AS NEEDED  . nitroGLYCERIN (NITROSTAT) 0.4 MG SL tablet DISSOLVE 1 TABLET UNDER THE TONGUE EVERY 5 MINUTES AS NEEDED FOR CHEST PAIN  . potassium chloride (MICRO-K) 10 MEQ CR capsule Take 2 capsules (20 mEq total) by mouth 2 (two) times daily.  Marland Kitchen spironolactone  (ALDACTONE) 25 MG tablet Take 1 tablet (25 mg total) by mouth every Monday, Wednesday, and Friday at 6 PM.    Allergies  Allergen Reactions  . Codeine Phosphate Anaphylaxis, Shortness Of Breath and Swelling  . Contrast Media [Iodinated Diagnostic Agents]     swelling  . Iodine Swelling and Rash    REACTION: unspecified This allergy is to Topical iodine only.   . Prednisone Itching and Rash    Keeps pt up all night   . Statins Other (See Comments)    Intolerant   . Tramadol Other (See Comments)    Kept patient awake    Social History   Tobacco Use  . Smoking status: Never Smoker  . Smokeless tobacco: Never Used  Substance Use Topics  . Alcohol use: No  . Drug use: No   Social History   Social History Narrative   Married mother of one, one grandchild.   Very socially active, enjoys cooking and having get-togethers her house. She had been exercising regularly but her back pain limits her.   Does not smoke, does not drink alcohol.    Family History family history includes Cancer in her father; Colitis in her maternal grandfather; Diabetes in her brother; Heart attack in her brother, brother, brother, and father; Heart disease in her father; Stroke in her brother and mother.  Wt Readings from Last 3 Encounters:  04/23/18 191 lb (86.6 kg)  03/16/18 188 lb (85.3 kg)  10/06/17 191 lb 12.8 oz (87 kg)    PHYSICAL EXAM BP 127/72   Pulse 82   Ht 5\' 8"  (1.727 m)   Wt 191 lb (86.6 kg)   BMI 29.04  kg/m  Physical Exam  Constitutional: She is oriented to person, place, and time. She appears well-developed and well-nourished. No distress (Tired and ill-appearing. But not toxic.).  HENT:  Head: Normocephalic and atraumatic.  Neck: Normal range of motion. Neck supple. No hepatojugular reflux and no JVD present. Carotid bruit is not present (Cannot exclude soft bruit on the right).  Cardiovascular: Normal rate, regular rhythm, S1 normal, S2 normal and intact distal pulses.   Occasional extrasystoles are present. PMI is not displaced. Exam reveals gallop, S4 and distant heart sounds.  Murmur heard.  Medium-pitched harsh crescendo-decrescendo early systolic murmur is present with a grade of 1/6 at the upper right sternal border radiating to the neck. Pulmonary/Chest: Effort normal and breath sounds normal. No respiratory distress. She has no wheezes. She has no rales.  Abdominal: Soft. Bowel sounds are normal. She exhibits no distension. There is no tenderness. There is no rebound.  Musculoskeletal: Normal range of motion. She exhibits edema (Trivial).  Neurological: She is alert and oriented to person, place, and time.  Psychiatric: She has a normal mood and affect. Her behavior is normal. Judgment and thought content normal.  Answers questions appropriately  Nursing note and vitals reviewed.   Adult ECG Report Normal sinus rhythm, rate 82bpm.  Nonspecific ST and T wave changes (less notable).  Otherwise normal EKG.  No significant change.  Other studies Reviewed: Additional studies/ records that were reviewed today include:  Recent Labs:    Lab Results  Component Value Date   CHOL 238 (H) 05/07/2017   HDL 35 (L) 05/07/2017   LDLCALC 130 (H) 05/07/2017   TRIG 365 (H) 05/07/2017   CHOLHDL 6.8 (H) 05/07/2017   Lab Results  Component Value Date   CREATININE 0.82 09/09/2017   BUN 21 (H) 09/09/2017   NA 135 09/09/2017   K 3.3 (L) 09/09/2017   CL 98 (L) 09/09/2017   CO2 26 09/09/2017    ASSESSMENT / PLAN: Problem List Items Addressed This Visit    Bruit of right carotid artery (Chronic)   CAD S/P percutaneous coronary angioplasty: pRCA BMS, mLAD BMS overlapped prox with DES for ISR - Primary (Chronic)    Overall stable symptoms.  No recurrent angina symptoms. Is on Plavix without aspirin. Is on standing dose of carvedilol and losartan along with spironolactone.  No longer requiring nitroglycerin, and not on Imdur.      Relevant Orders   EKG  12-Lead (Completed)   Congestive heart failure with LV diastolic dysfunction, NYHA class 2 (HCC) (Chronic)    Again relatively stable symptoms.  Probably contributing to her dyspnea.  Edema is well controlled now on current dose of furosemide with PRN Zaroxolyn along with spironolactone.  This is probably the least she has complained about edema in a while.      Relevant Orders   EKG 12-Lead (Completed)   Dyslipidemia, goal LDL below 70 - WITH STATIN INTOLERANCE (Chronic)    She really declines any evaluation or treatment.  We have tried lots of different options.  She was not able to afford Repatha or Praluent.  Refuses to take other medications.  Mostly because she was feeling so well today, I chose not to address this issue.  My plan will be to see if she may qualify for additional medications that are being evaluated and pharmaceutical trials now.      Essential hypertension (Chronic)    Blood pressure looks good today on current meds.  No change  Exertional dyspnea (Chronic)   Relevant Orders   EKG 12-Lead (Completed)   Obesity (BMI 30.0-34.9) (Chronic)    Has actually now maintaining stable weight below the "obesity category".  Hoping that now she will be maintained stable diet and continued exercise and not gaining back weight.      Palpitation (Chronic)    Well-controlled.  But if necessary to take additional dose of carvedilol.      Stasis edema of bilateral lower extremity (Chronic)    Doing well with support stockings and current dose of Lasix/Zaroxolyn and spironolactone.         Current medicines are reviewed at length with the patient today. (+/- concerns) n/a The following changes have been made: n/a  Patient Instructions  No medication changes  MAY USE AN ADDITIONAL DOSE OF CARVEDILOL IF NEED FOR PALPATIONS      Your physician wants you to follow-up in [place].You will receive a reminder letter in the mail two months in advance. If  you don't receive a letter, please call our office to schedule the follow-up appointment.   If you need a refill on your cardiac medications before your next appointment, please call your pharmacy.    Studies Ordered:   Orders Placed This Encounter  Procedures  . EKG 12-Lead      Glenetta Hew, M.D., M.S. Interventional Cardiologist   Pager # [phone] Phone # [phone] [address] [place], [place] [address]

## 2018-04-26 ENCOUNTER — Encounter: Payer: Self-pay | Admitting: Cardiology

## 2018-04-26 NOTE — Assessment & Plan Note (Signed)
Has actually now maintaining stable weight below the "obesity category".  Hoping that now she will be maintained stable diet and continued exercise and not gaining back weight.

## 2018-04-26 NOTE — Assessment & Plan Note (Signed)
She really declines any evaluation or treatment.  We have tried lots of different options.  She was not able to afford Repatha or Praluent.  Refuses to take other medications.  Mostly because she was feeling so well today, I chose not to address this issue.  My plan will be to see if she may qualify for additional medications that are being evaluated and pharmaceutical trials now.

## 2018-04-26 NOTE — Assessment & Plan Note (Signed)
Well-controlled.  But if necessary to take additional dose of carvedilol.

## 2018-04-26 NOTE — Assessment & Plan Note (Signed)
Overall stable symptoms.  No recurrent angina symptoms. Is on Plavix without aspirin. Is on standing dose of carvedilol and losartan along with spironolactone.  No longer requiring nitroglycerin, and not on Imdur.

## 2018-04-26 NOTE — Assessment & Plan Note (Signed)
Again relatively stable symptoms.  Probably contributing to her dyspnea.  Edema is well controlled now on current dose of furosemide with PRN Zaroxolyn along with spironolactone.  This is probably the least she has complained about edema in a while.

## 2018-04-26 NOTE — Assessment & Plan Note (Signed)
Blood pressure looks good today on current meds.  No change 

## 2018-04-26 NOTE — Assessment & Plan Note (Signed)
Doing well with support stockings and current dose of Lasix/Zaroxolyn and spironolactone.

## 2018-06-28 ENCOUNTER — Other Ambulatory Visit: Payer: Self-pay | Admitting: Cardiology

## 2018-06-30 ENCOUNTER — Other Ambulatory Visit: Payer: Self-pay | Admitting: Cardiology

## 2018-07-02 ENCOUNTER — Other Ambulatory Visit: Payer: Self-pay | Admitting: Cardiology

## 2018-07-02 NOTE — Telephone Encounter (Signed)
Rx has been sent to the pharmacy electronically. ° °

## 2018-07-10 DIAGNOSIS — M25512 Pain in left shoulder: Secondary | ICD-10-CM | POA: Diagnosis not present

## 2018-08-01 ENCOUNTER — Other Ambulatory Visit: Payer: Self-pay | Admitting: Cardiology

## 2018-08-17 DIAGNOSIS — M2041 Other hammer toe(s) (acquired), right foot: Secondary | ICD-10-CM | POA: Diagnosis not present

## 2018-08-17 DIAGNOSIS — L97512 Non-pressure chronic ulcer of other part of right foot with fat layer exposed: Secondary | ICD-10-CM | POA: Diagnosis not present

## 2018-08-17 DIAGNOSIS — M2012 Hallux valgus (acquired), left foot: Secondary | ICD-10-CM | POA: Diagnosis not present

## 2018-08-17 DIAGNOSIS — M2011 Hallux valgus (acquired), right foot: Secondary | ICD-10-CM | POA: Insufficient documentation

## 2018-08-17 DIAGNOSIS — M2042 Other hammer toe(s) (acquired), left foot: Secondary | ICD-10-CM | POA: Diagnosis not present

## 2018-08-31 DIAGNOSIS — M2041 Other hammer toe(s) (acquired), right foot: Secondary | ICD-10-CM | POA: Diagnosis not present

## 2018-08-31 DIAGNOSIS — L97512 Non-pressure chronic ulcer of other part of right foot with fat layer exposed: Secondary | ICD-10-CM | POA: Diagnosis not present

## 2018-09-14 ENCOUNTER — Other Ambulatory Visit: Payer: Self-pay | Admitting: Cardiology

## 2018-09-16 NOTE — Progress Notes (Signed)
HISTORY AND PHYSICAL     CC:  Follow up Requesting Provider:  Kristopher Glee., MD  HPI: This is a 78 y.o. female who is s/p angioplasty of celiac artery on 09/09/17 by Dr. Trula Slade for celiac artery in stent stenosis.    She had a long hx of long standing abdominal pain.  As a last ditch effort, it was felt treating a 90% celiac artery stenosis may help alleviate some of her sx and was done in 2018 with a 57mm balloon expandable stent.  She did not have any changes in her sx.   At her last visit, she did have some leg swelling and her venous reflux study was essentially normal and she was counseled to wear compression stockings and return in 6 months.  She saw Dr. Ellyn Hack in August and she had less edema with lasix and spironolactone.    She has a hx of cholecystectomy at age 28.  She has been diagnosed with pancreatic divisum and has had stents placed ~ 20 years ago at [facility].  She also has a hx of left renal artery stent placed by cardiologist in [facility] and is followed by Dr. Ellyn Hack.   She presents today for follow up.  She states that her abdomen is the same.  She has pain that has not changed and worse after she has been up cooking or up and about for an extended period of time.  She states that it does not worsen with food.  She states that she dropped a quart can of tomatoes on her right 2nd toe and has been seeing a podiatrist.  She did require abx and this is improving.  She states that she does have some swelling in her legs and states it has gotten better with the fluid pills.  It is better with elevation and improves in the morning after she has been in bed.  She does not wear compression socks but does have them.  Dr. Ellyn Hack continues to follow her blood pressure.   She is not on a statin due to allergy.  She has never smoked.  She is on Plavix.  She is on BB and ARB.   Past Medical History:  Diagnosis Date  . Arthritis    "fingers, right shoulder" (09/09/2017)  . Atrial  fibrillation ([place])   . Back pain with radiation    Chronic Back Pain - mutliple surgeries (including tumor removal)  . Bilateral edema of lower extremity    Chronic, likely related to venous stasis  . CAD S/P percutaneous coronary angioplasty    a) LHC: 07/30/10. -- 3.0x95mm Integrity BMS to pRCA & 2.5x 22mm Integrity BMS mLAD(@ D2).  b) Class III-IV Angina 03/2011: LHC- ISR in LAD BMS -- prox overlapping Promus DES 2.5x47mm and PTCA of jailed D2 ostium-prox 80%. c) 02/03/12:  LHC- patent stents.  Jailed diagonal. with stable flow; d) Peri-OP NSTEMI 04/2012 - LHC in 12/'13 -   . Celiac artery stenosis ([place])    12/2016 - STENT placement  . Complication of anesthesia    "used to wake up wild years ago" (09/09/2017)  . Congestive heart failure with LV diastolic dysfunction, NYHA class 2 ([place])    06/13/10:  last 2D echo-  EF >55%, Mild TR, Mod Conc LVH - Grade 1 diastolic dysfunction (abnormal relaxation) --> LVEDP on Cath 28 mmHg & mild 2nd Pulm HTN  . Diet-controlled type 2 diabetes mellitus ([place])   . Diverticulitis of colon (without mention of hemorrhage)(562.11)   .  Dyslipidemia, goal LDL below 70    Intolerant to statins  . GERD (gastroesophageal reflux disease)   . Hepatitis ~ 1957   "yellow jaundice" (01/14/2017)  . Hepatomegaly   . Hiatal hernia   . History of blood transfusion 04/2012   "when I had a heart attack"  . History of kidney stones    "I've got a stone embedded in one of my kidneys" (09/09/2017)  . History of stomach ulcers "years ago"  . Irritable bowel syndrome (IBS)   . Labile essential hypertension    Partially related to RAS  . Mesenteric artery stenosis (HCC)    95% Celiac Artery - ostial, 20-30% SMA.  Bilateral Renal A: L RA stent patent, R RA 20-30% -- Conservative Management  . NSTEMI (non-ST elevated myocardial infarction) ([place]) 04/2012   Unclear the details, apparently this was postoperative from her back surgery that she was cleared for my last saw her in June.   Reportedly had stents placed  . Pancreas divisum    On pancreatic enzyme  . Renal artery stenosis ([place]) 2011; 12/'13   a) Angiogram 02/03/12:  50-60%L RA stenosis, 40% R R Inferior artery; b) 12/'13: S/P L RA Stent ([facility]) 6.0 mm x 15 mm; c) Renal Duplex 10/2013: <60% L RA, <60 R RA, ~60% SMA & Celiac A.  . Stricture and stenosis of esophagus   . Unspecified gastritis and gastroduodenitis without mention of hemorrhage     Past Surgical History:  Procedure Laterality Date  . APPENDECTOMY    . BACK SURGERY    . BREAST DUCTAL SYSTEM EXCISION     right  . BREAST SURGERY Right   . CARDIAC CATHETERIZATION N/A 03/16/2015   Procedure: Left Heart Cath and Coronary Angiography;  Surgeon: Leonie Man, MD;  Location: [facility];  Service: Cardiovascular; Widely patent m-dLAD stents as wellas pRCA stent.  High LVEDP, small Diag & Om vessels with moderate stenosis   . Cardiac Event Monitor  01/2017   Mostly NSR with occasional sinus tachycardia and rare bradycardia. No A. fib. No PND or PSVT. Rare PACs and PVCs.  Marland Kitchen CARDIOVASCULAR STRESS TEST     11/07/11:  Normal perfusion pattern.  EF 66% No wall motion abnormalities.   Marland Kitchen CATARACT EXTRACTION W/ INTRAOCULAR LENS  IMPLANT, BILATERAL Bilateral   . CHOLECYSTECTOMY OPEN    . COLONOSCOPY  03/01/2009   normal   . CORONARY ANGIOPLASTY WITH STENT PLACEMENT  07/2010, 02/2011   LHC: 07/30/10.  3.0x74mm Integrity BMS to RCA and 2.5x 87mm Integrity BMS LAD.  03/2011: LHC- ISRS in LAD treated with overlapping Promus DES 2.5x36mm and PTCA of Diagonal.  02/03/12:  LHC- patent stents.  Jailed diagonal.  . DOBUTAMINE STRESS ECHO  03/07/2015   [facility] (ordered for pre-op evaluation for EUS/ERCP --> abnormal EKG:  strreesss test shhoowwed 1 mm ST segment depressions downsloping. No wall motion abnormalities at peak exercise or at rest. Diastolic dysfunction was noted but normal systolic function - EF greater than 55%. No bouts of regurgitation or stenosis. Resting  hypertension with exaggerated response  . ESOPHAGOGASTRODUODENOSCOPY  04/08/2012  . ESOPHAGOGASTRODUODENOSCOPY (EGD) WITH ESOPHAGEAL DILATION  X 2  . FRACTURE SURGERY    . HAMMER TOE SURGERY Bilateral    "took bone off the top of 2nd toe on each foot"  . HIP SURGERY Left    "something to do w/my back"  . JOINT REPLACEMENT    . KNEE SURGERY Left    "had fluid drained  off it a couple times"  . LEFT HEART CATHETERIZATION WITH CORONARY ANGIOGRAM N/A 02/03/2012   Procedure: LEFT HEART CATHETERIZATION WITH CORONARY ANGIOGRAM;  Surgeon: Leonie Man, MD;  Location[facility] C[facility] LAB;  Service: Cardiovascular;  Laterality: N/A;  . LEFT HEART CATHETERIZATION WITH CORONARY ANGIOGRAM N/A 05/12/2014   Procedure: LEFT HEART CATHETERIZATION WITH CORONARY ANGIOGRAM;  Surgeon: Lorretta Harp, MD;  Location: [facility] CATH LAB;  Service: Cardiovascular: Stable CAD. Patent stents. Patent renal artery stent  . LEFT HEART CATHETERIZATION WITH CORONARY ANGIOGRAM  08/2012   Peri-Op MI @ [facility] -- 40% ostial D1, patent LAD stents, 10% RCA ISR  . LEFT HEART CATHETERIZATION WITH CORONARY ANGIOGRAM   03/2012   ~30% ISR RCA, patent LAD stent & D2; EDP ~28 mmHg  . NM MYOVIEW LTD  12/2016   LOW RISK study. No ischemia or infarction. EF 65-75%.  Marland Kitchen PANCREAS SURGERY     "stent in my pancreas"; Dr Pershing Proud  . PERCUTANEOUS PINNING PHALANX FRACTURE OF HAND Left ~ 2013  . PERIPHERAL VASCULAR BALLOON ANGIOPLASTY  09/09/2017   Procedure: PERIPHERAL VASCULAR BALLOON ANGIOPLASTY;  Surgeon: Serafina Mitchell, MD;  Location: [facility];  Service: Cardiovascular;;  Celiac instent  . PERIPHERAL VASCULAR INTERVENTION  01/14/2017   Procedure: Peripheral Vascular Intervention;  Surgeon: Serafina Mitchell, MD;  Location: [facility];  Service: Cardiovascular;;  mesentric  . RENAL ARTERY STENT Left 08/2012   @ [facility] - 6.0 mm x 15 mm  . SPINE SURGERY     tumor removed 07/2010; Redo Surgery 04/2012; Sacroiliac  Sgx 08/2013  . TOTAL ABDOMINAL HYSTERECTOMY    . TOTAL KNEE ARTHROPLASTY Left ~ 2012  . VISCERAL ANGIOGRAM N/A 05/12/2014   Procedure: VISCERAL ANGIOGRAM;  Surgeon: Lorretta Harp, MD;  Location: [facility] CATH LAB;  Service: Cardiovascular;  Laterality: N/A;  . VISCERAL ANGIOGRAPHY N/A 01/14/2017   Procedure: Mesenteric  Angiography;  Surgeon: Serafina Mitchell, MD;  Location: [facility];  Service: Cardiovascular;  Laterality: N/A;  . VISCERAL ANGIOGRAPHY N/A 09/09/2017   Procedure: VISCERAL ANGIOGRAPHY;  Surgeon: Serafina Mitchell, MD;  Location: [facility];  Service: Cardiovascular;  Laterality: N/A;    Allergies  Allergen Reactions  . Codeine Phosphate Anaphylaxis, Shortness Of Breath and Swelling  . Contrast Media [Iodinated Diagnostic Agents]     swelling  . Iodine Swelling and Rash    REACTION: unspecified This allergy is to Topical iodine only.   . Prednisone Itching and Rash    Keeps pt up all night   . Statins Other (See Comments)    Intolerant   . Tramadol Other (See Comments)    Kept patient awake    Current Outpatient Medications  Medication Sig Dispense Refill  . acetaminophen (TYLENOL) 500 MG tablet Take 2,000 mg by mouth 4 (four) times daily as needed for moderate pain.     Marland Kitchen amitriptyline (ELAVIL) 25 MG tablet TAKE 1 TABLET(25 MG) BY MOUTH AT BEDTIME (Patient taking differently: TAKE 1 TABLET(25 MG) BY MOUTH AT BEDTIME AS NEEDED FOR SLEEP) 30 tablet 3  . Artificial Tear Solution (TEARS RENEWED OP) Apply 1 drop to eye daily as needed (dry eyes).    . carvedilol (COREG) 6.25 MG tablet Take 1 tablet (6.25 mg total) by mouth 2 (two) times daily with a meal. May take an extra tablet daily if having palpations 90 tablet 6  . clopidogrel (PLAVIX) 75 MG tablet TAKE 1 TABLET(75  MG) BY MOUTH DAILY 30 tablet 12  . furosemide (LASIX) 80 MG tablet Take 80 mg by mouth.    . furosemide (LASIX) 80 MG tablet TAKE 1 TABLET(80 MG) BY MOUTH TWICE DAILY 180 tablet 1  .  indapamide (LOZOL) 2.5 MG tablet TAKE 1 TABLET(2.5 MG TOTAL) BY MOUTH DAILY, 30 MINUTES BEFORE DAILY FUROSEMIDE 30 tablet 7  . lidocaine (XYLOCAINE) 5 % ointment Apply 1 application topically at bedtime as needed (toe pain).     . Lidocaine 4 % PTCH Apply 1 patch topically daily as needed (pain).    Marland Kitchen losartan (COZAAR) 50 MG tablet TAKE 1 TABLET(50 MG) BY MOUTH DAILY 90 tablet 2  . metolazone (ZAROXOLYN) 2.5 MG tablet TAKE 1 TO 2 TABLETS BY MOUTH 30 TO 60 MINUTES PRIOR TO MORNING DOSE OF LASIX AS NEEDED 180 tablet 1  . nitroGLYCERIN (NITROSTAT) 0.4 MG SL tablet DISSOLVE 1 TABLET UNDER THE TONGUE EVERY 5 MINUTES AS NEEDED FOR CHEST PAIN 25 tablet 4  . potassium chloride (MICRO-K) 10 MEQ CR capsule Take 2 capsules (20 mEq total) by mouth 2 (two) times daily. 120 capsule 6  . spironolactone (ALDACTONE) 25 MG tablet TAKE 1 TABLET BY MOUTH EVERY MONDAY, WEDNESDAY, AND FRIDAY AT 6 PM 45 tablet 0   No current facility-administered medications for this visit.     Family History  Problem Relation Age of Onset  . Cancer Father        mets  . Heart attack Father   . Heart disease Father   . Stroke Mother   . Heart attack Brother   . Heart attack Brother   . Colitis Maternal Grandfather   . Diabetes Brother   . Stroke Brother   . Heart attack Brother   . Colon cancer Neg Hx   . Stomach cancer Neg Hx     Social History   Socioeconomic History  . Marital status: Married    Spouse name: Not on file  . Number of children: 1  . Years of education: Not on file  . Highest education level: Not on file  Occupational History  . Occupation: Programmer, applications  Social Needs  . Financial resource strain: Not on file  . Food insecurity:    Worry: Not on file    Inability: Not on file  . Transportation needs:    Medical: Not on file    Non-medical: Not on file  Tobacco Use  . Smoking status: Never Smoker  . Smokeless tobacco: Never Used  Substance and Sexual Activity  . Alcohol use: No  . Drug  use: No  . Sexual activity: Not Currently  Lifestyle  . Physical activity:    Days per week: Not on file    Minutes per session: Not on file  . Stress: Not on file  Relationships  . Social connections:    Talks on phone: Not on file    Gets together: Not on file    Attends religious service: Not on file    Active member of club or organization: Not on file    Attends meetings of clubs or organizations: Not on file    Relationship status: Not on file  . Intimate partner violence:    Fear of current or ex partner: Not on file    Emotionally abused: Not on file    Physically abused: Not on file    Forced sexual activity: Not on file  Other Topics Concern  . Not on file  Social History  Narrative   Married mother of one, one grandchild.   Very socially active, enjoys cooking and having get-togethers her house. She had been exercising regularly but her back pain limits her.   Does not smoke, does not drink alcohol.     REVIEW OF SYSTEMS:   [X]  denotes positive finding, [ ]  denotes negative finding Cardiac  Comments:  Chest pain or chest pressure: x   Shortness of breath upon exertion:    Short of breath when lying flat:    Irregular heart rhythm: x       xVascular    Pain in calf, thigh, or hip brought on by ambulation: x   Pain in feet at night that wakes you up from your sleep:  x   Blood clot in your veins:    Leg swelling:  x       Pulmonary    Oxygen at home:    Productive cough:     Wheezing:         Neurologic    Sudden weakness in arms or legs:     Sudden numbness in arms or legs:  x   Sudden onset of difficulty speaking or slurred speech:    Temporary loss of vision in one eye:     Problems with dizziness:         Gastrointestinal    Blood in stool:     Vomited blood:         Genitourinary    Burning when urinating:  x   Blood in urine:        Psychiatric    Major depression:         Hematologic    Bleeding problems:    Problems with blood  clotting too easily:        Skin    Rashes or ulcers:        Constitutional    Fever or chills:      PHYSICAL EXAMINATION:  Today's Vitals   09/21/18 0905  BP: (!) 152/66  Pulse: 75  Resp: 18  Temp: 97.8 F (36.6 C)  TempSrc: Oral  SpO2: 96%  Weight: 171 lb (77.6 kg)  Height: 5\' 8"  (1.727 m)   Body mass index is 26 kg/m.   General:  WDWN in NAD; vital signs documented above Gait: Not observed HENT: WNL, normocephalic Pulmonary: normal non-labored breathing , without Rales, rhonchi,  wheezing Cardiac: regular HR, without  Murmurs without carotid bruits Abdomen: soft, NT, no masses Skin: without rashes Vascular Exam/Pulses:  Right Left  Radial 2+ (normal) 2+ (normal)  Ulnar 2+ (normal) 2+ (normal)  DP 2+ (normal) 2+ (normal)  PT Unable to palpate  Unable to palpate    Extremities: without ischemic changes, without Gangrene , with cellulitis; without open wounds; she does have a small healing ulcer on the dorsum of the right second toe Musculoskeletal: no muscle wasting or atrophy  Neurologic: A&O X 3;  No focal weakness or paresthesias are detected Psychiatric:  The pt has Normal affect.   Non-Invasive Vascular Imaging:   Mesenteric duplex 03/16/18: FINAL INTERPRETATION: Mesenteric: 70 to 99% stenosis in the celiac artery and superior mesenteric artery. Duplex Findings: +--------------------+--------+--------+------+ Mesenteric          PSV cm/sEDV cm/sPlaque +--------------------+--------+--------+------+ Aorta Prox            142      0           +--------------------+--------+--------+------+ Celiac Artery Origin  247  37          +--------------------+--------+--------+------+ SMA Origin            296      29     Renal artery duplex 03/16/18: Right: Technically limited by body habitus and bowel gas. There        appears to be a second renal artery. Unable to visualize the        proximal renal artery. No renal artery stenosis  noted in the        visualized segments. Kidney length at the low end of normal. Left:  Technically limited by body habitus and bowel gas. Evidence        of a > 60% stenosis in the left renal artery at the origin.        Kidney length at the low end of normal.  Mesenteric duplex 09/21/2018: Mesenteric: 70 to 99% stenosis in the celiac artery and superior mesenteric artery. Elevated velocities noted in the proximal aorta however, not well visualized due to overlying bowel gas.  Duplex Findings: +----------------------+--------+--------+------+--------+ Mesenteric            PSV cm/sEDV cm/sPlaqueComments +----------------------+--------+--------+------+--------+ Aorta at SMA            216      39                  +----------------------+--------+--------+------+--------+ Celiac Artery Origin    360      77                  +----------------------+--------+--------+------+--------+ Celiac Artery Proximal  339      83                  +----------------------+--------+--------+------+--------+ SMA Origin              277      72                  +----------------------+--------+--------+------+--------+ SMA Proximal            277      52                  +----------------------+--------+--------+------+--------+ SMA Mid                 224      34                  +----------------------+--------+--------+------+--------+ SMA Distal              193      47                  +----------------------+--------+--------+------+--------+ CHA                     132      31                  +----------------------+--------+--------+------+--------+ Splenic                 158      41                  Renal artery duplex 09/21/2018: Right: 1-59% stenosis of the right renal artery. Left:  Evidence of a > 60% stenosis in the left renal artery.  Pt meds includes: Statin:  No. Beta Blocker:  Yes.   Aspirin:  No. ACEI:  No. ARB:   Yes.   CCB use:  No Other Antiplatelet/Anticoagulant:  Yes Plavix   ASSESSMENT/PLAN:: 78 y.o. female s/p angioplasty of celiac artery on 09/09/17 by Dr. Trula Slade for celiac artery in stent stenosis but prior to that she had a 90% celiac artery stenosis in May 2018 and underwent balloon expandable stent by Dr. Trula Slade and returns today for follow up.   -pt's abdominal pain continues to be unchanged.  Her mesenteric study was reviewed with Dr. Trula Slade.  Celiac artery origin elevated from last visit, but given pain is unchanged and not related to eating, will continue to monitor and see her back in 6 months.  -renal artery duplex essentially unchanged and blood pressure decent on current meds.  Dr. Ellyn Hack to continue to monitor. -pt is on Plavix.  Pt has intolerance to statins.  -f/u in 6 months with repeat renal artery duplex and mesenteric duplex.  -she continues to have pain in her left side that has been present for about 5 years.  She has seen GI and vascular.  Advised her to f/u with her PCP to see what next step would be for evaluation.   Leontine Locket, PA-C [facility] [phone]  Clinic MD:  Trula Slade

## 2018-09-21 ENCOUNTER — Ambulatory Visit (INDEPENDENT_AMBULATORY_CARE_PROVIDER_SITE_OTHER): Payer: Medicare Other | Admitting: Physician Assistant

## 2018-09-21 ENCOUNTER — Ambulatory Visit (HOSPITAL_COMMUNITY)
Admission: RE | Admit: 2018-09-21 | Discharge: 2018-09-21 | Disposition: A | Discharge status: Home / Self Care | Payer: Medicare Other | Source: Ambulatory Visit | Attending: Family | Admitting: Family

## 2018-09-21 ENCOUNTER — Ambulatory Visit (INDEPENDENT_AMBULATORY_CARE_PROVIDER_SITE_OTHER)
Admission: RE | Admit: 2018-09-21 | Discharge: 2018-09-21 | Disposition: A | Discharge status: Home / Self Care | Payer: Medicare Other | Source: Ambulatory Visit | Attending: Family | Admitting: Family

## 2018-09-21 ENCOUNTER — Encounter: Payer: Self-pay | Admitting: Family

## 2018-09-21 ENCOUNTER — Other Ambulatory Visit: Payer: Self-pay

## 2018-09-21 VITALS — BP 152/66 | HR 75 | Temp 97.8°F | Resp 18 | Ht 68.0 in | Wt 171.0 lb

## 2018-09-21 DIAGNOSIS — I701 Atherosclerosis of renal artery: Secondary | ICD-10-CM

## 2018-09-21 DIAGNOSIS — K551 Chronic vascular disorders of intestine: Secondary | ICD-10-CM | POA: Diagnosis not present

## 2018-09-21 DIAGNOSIS — I774 Celiac artery compression syndrome: Secondary | ICD-10-CM

## 2018-09-21 DIAGNOSIS — I771 Stricture of artery: Secondary | ICD-10-CM

## 2018-10-08 ENCOUNTER — Other Ambulatory Visit: Payer: Self-pay | Admitting: Cardiology

## 2018-10-08 DIAGNOSIS — I10 Essential (primary) hypertension: Secondary | ICD-10-CM | POA: Diagnosis not present

## 2018-10-08 DIAGNOSIS — M542 Cervicalgia: Secondary | ICD-10-CM | POA: Diagnosis not present

## 2018-10-08 DIAGNOSIS — M461 Sacroiliitis, not elsewhere classified: Secondary | ICD-10-CM | POA: Diagnosis not present

## 2018-10-08 DIAGNOSIS — M545 Low back pain: Secondary | ICD-10-CM | POA: Diagnosis not present

## 2018-10-15 DIAGNOSIS — M2042 Other hammer toe(s) (acquired), left foot: Secondary | ICD-10-CM | POA: Diagnosis not present

## 2018-10-15 DIAGNOSIS — B351 Tinea unguium: Secondary | ICD-10-CM | POA: Diagnosis not present

## 2018-10-15 DIAGNOSIS — M2011 Hallux valgus (acquired), right foot: Secondary | ICD-10-CM | POA: Diagnosis not present

## 2018-10-15 DIAGNOSIS — M2041 Other hammer toe(s) (acquired), right foot: Secondary | ICD-10-CM | POA: Diagnosis not present

## 2018-10-15 DIAGNOSIS — M2012 Hallux valgus (acquired), left foot: Secondary | ICD-10-CM | POA: Diagnosis not present

## 2018-10-22 ENCOUNTER — Encounter: Payer: Self-pay | Admitting: Cardiology

## 2018-10-22 ENCOUNTER — Ambulatory Visit (INDEPENDENT_AMBULATORY_CARE_PROVIDER_SITE_OTHER): Payer: Medicare Other | Admitting: Cardiology

## 2018-10-22 VITALS — BP 142/60 | HR 74 | Ht 68.0 in | Wt 196.2 lb

## 2018-10-22 DIAGNOSIS — E785 Hyperlipidemia, unspecified: Secondary | ICD-10-CM | POA: Diagnosis not present

## 2018-10-22 DIAGNOSIS — I701 Atherosclerosis of renal artery: Secondary | ICD-10-CM

## 2018-10-22 DIAGNOSIS — I10 Essential (primary) hypertension: Secondary | ICD-10-CM | POA: Diagnosis not present

## 2018-10-22 DIAGNOSIS — I5030 Unspecified diastolic (congestive) heart failure: Secondary | ICD-10-CM

## 2018-10-22 DIAGNOSIS — R002 Palpitations: Secondary | ICD-10-CM | POA: Diagnosis not present

## 2018-10-22 DIAGNOSIS — R0609 Other forms of dyspnea: Secondary | ICD-10-CM

## 2018-10-22 DIAGNOSIS — Z9861 Coronary angioplasty status: Secondary | ICD-10-CM

## 2018-10-22 DIAGNOSIS — I2510 Atherosclerotic heart disease of native coronary artery without angina pectoris: Secondary | ICD-10-CM | POA: Diagnosis not present

## 2018-10-22 NOTE — Patient Instructions (Signed)
Medication Instructions:   START TAKING LOSARTAN AT LUNCHTIME    SEE BELOW If you need a refill on your cardiac medications before your next appointment, please call your pharmacy.   Lab work: NOT NEEDED If you have labs (blood work) drawn today and your tests are completely normal, you will receive your results only by: Marland Kitchen MyChart Message (if you have MyChart) OR . A paper copy in the mail If you have any lab test that is abnormal or we need to change your treatment, we will call you to review the results.  Testing/Procedures: NOT NEEDED  Follow-Up: At [facility], you and your health needs are our priority.  As part of our continuing mission to provide you with exceptional heart care, we have created designated Provider Care Teams.  These Care Teams include your primary Cardiologist (physician) and Advanced Practice Providers (APPs -  Physician Assistants and Nurse Practitioners) who all work together to provide you with the care you need, when you need it. You will need a follow up appointment in 6 months AUG 2020.  Please call our office 2 months in advance to schedule this appointment.  You may see Glenetta Hew, MD or one of the following Advanced Practice Providers on your designated Care Team:   Rosaria Ferries, PA-C . Jory Sims, DNP, ANP  Any Other Special Instructions Will Be Listed Below (If Applicable).  IF YOU HAVE SOME PALPATIONS- MAY TAKE  1/2 TABLET OF CARVEDILOL ( OF THE NEXT DOSE). HOLD TAKING LOSARTAN AT THAT TIME.

## 2018-10-22 NOTE — Progress Notes (Signed)
PCP: Kristopher Glee., MD  VascSgx: Dr. Trula Slade   Clinic Note: Chief Complaint  Patient presents with  . Follow-up    Pretty stable  . Coronary Artery Disease    No angina    HPI: [name] is a 78 y.o. female with a PMH of CAD-PCI (describe below) with diastolic dysfunction and PAD (mostly mesenteric) who presents today for six-month follow-up [name] has a history of known CAD-PCI as well as chronic diastolic dysfunction and PAD /splanchnic artery disease.  CAD: pRCA & mLAD x 2 in 2011 (BMS stents used for preop back surgery),  Post-op back Sgx, had recurrent angina --> DES x 2 LAD for ISR Myoview May 2018: Normal EF (hyperdynamic LV function EF greater than 65%).  LOW RISK.  No ischemia or infarction. PAD: - RAS - s/p renal stent; celiac/SMA stenosis s/p SMA stent (recent)  Also has very difficult to control lipids with statin intolerance and refusal to take other medications. - Has been evaluated by our [facility] team - but stopped going  Chronic edema at least in part partially related to diastolic dysfunction, also related to venous insufficiency. - Usually controlled with Lasix and metolazone. She has renal artery & Celiac/SMA disease - sp Celiac & Renal Artery stenting - followed by Dr. Dimas Aguas was last seen in Aug 2019 Doing fairly well.  Not really noticed that much the way of palpitations.  GI symptoms are actually getting better.  Still notes exertional dyspnea but not really chest discomfort.  Despite all of this, she remains very active doing all of her household chores and cooking.  Recent Hospitalizations:   N/A  Studies Personally Reviewed - (if available, images/films reviewed: From Epic Chart or Care Everywhere)  Renal artery ultrasound and mesenteric artery ultrasound January 2020: 1-59% RRA stenosis,> 60% LRA.  (Stable).  70-99% celiac and SMA stenosis.  Proximal aorta velocities elevated.  Interval History:  [name] returns today  actually looks a little more healthy than usual.  She is gained back a lot of the weight that she had lost.  She is not happy with having gained weight back but she does look a little more healthy.  She says the palpitations have really gotten better she still feels them off and on, but there is nothing prolonged or worrisome to her.  She still has her shortness of breath but really has not changed any in the last year or so.  She has occasional sharp pinpoint discomfort in her chest off and on, but not associated with exertion.  She is still very active in the kitchen cooking and baking, doing canning, making jam etc.  She still has some of her GI symptoms, but things seem to have settled out a little bit now. She says that she occasionally feels lightheaded and dizzy when she first stands up, but that is happening little less frequently since she has been able to tolerate p.o. better.  No syncope or near syncope.  No TIA or amaurosis fugax..   She really has not noticed any PND or orthopnea, and her edema seems to be pretty stable taking her furosemide essentially twice daily. Although she notes palpitations, she does not have any fast irregular heartbeat sensations to suggest an arrhythmia.   ROS: A comprehensive was performed. Review of Systems  Constitutional: Positive for malaise/fatigue. Negative for chills, fever and weight loss (Weight seems to have finally stabilized out).  HENT: Negative for congestion and nosebleeds.  Respiratory: Positive for shortness of breath (Baseline). Negative for cough and wheezing.   Cardiovascular: Positive for palpitations (See HPI).       Per HPI  Gastrointestinal: Positive for abdominal pain, diarrhea and nausea. Negative for blood in stool, melena and vomiting.       Overall GI Sx are more stable / improved.  Genitourinary: Negative for dysuria, frequency and hematuria.  Musculoskeletal: Positive for back pain, joint pain and myalgias.  Neurological:  Positive for dizziness (With palpitations, and orthostatic) and tingling. Negative for weakness.  Psychiatric/Behavioral: The patient has insomnia. The patient is not nervous/anxious.   All other systems reviewed and are negative.   I have reviewed and (if needed) personally updated the patient's problem list, medications, allergies, past medical and surgical history, social and family history.   Past Medical History:  Diagnosis Date  . Arthritis    "fingers, right shoulder" (09/09/2017)  . Atrial fibrillation ([place])   . Back pain with radiation    Chronic Back Pain - mutliple surgeries (including tumor removal)  . Bilateral edema of lower extremity    Chronic, likely related to venous stasis  . CAD S/P percutaneous coronary angioplasty    a) LHC: 07/30/10. -- 3.0x56mm Integrity BMS to pRCA & 2.5x 78mm Integrity BMS mLAD(@ D2).  b) Class III-IV Angina 03/2011: LHC- ISR in LAD BMS -- prox overlapping Promus DES 2.5x19mm and PTCA of jailed D2 ostium-prox 80%. c) 02/03/12:  LHC- patent stents.  Jailed diagonal. with stable flow; d) Peri-OP NSTEMI 04/2012 - LHC in 12/'13 -   . Celiac artery stenosis ([place])    12/2016 - STENT placement  . Complication of anesthesia    "used to wake up wild years ago" (09/09/2017)  . Congestive heart failure with LV diastolic dysfunction, NYHA class 2 ([place])    06/13/10:  last 2D echo-  EF >55%, Mild TR, Mod Conc LVH - Grade 1 diastolic dysfunction (abnormal relaxation) --> LVEDP on Cath 28 mmHg & mild 2nd Pulm HTN  . Diet-controlled type 2 diabetes mellitus ([place])   . Diverticulitis of colon (without mention of hemorrhage)(562.11)   . Dyslipidemia, goal LDL below 70    Intolerant to statins  . GERD (gastroesophageal reflux disease)   . Hepatitis ~ 1957   "yellow jaundice" (01/14/2017)  . Hepatomegaly   . Hiatal hernia   . History of blood transfusion 04/2012   "when I had a heart attack"  . History of kidney stones    "I've got a stone embedded in one of my kidneys"  (09/09/2017)  . History of stomach ulcers "years ago"  . Irritable bowel syndrome (IBS)   . Labile essential hypertension    Partially related to RAS  . Mesenteric artery stenosis (H[facility])    95% Celiac Artery - ostial, 20-30% SMA.  Bilateral Renal A: L RA stent patent, R RA 20-30% -- Conservative Management  . NSTEMI (non-ST elevated myocardial infarction) ([place]) 04/2012   Unclear the details, apparently this was postoperative from her back surgery that she was cleared for my last saw her in June.  Reportedly had stents placed  . Pancreas divisum    On pancreatic enzyme  . Renal artery stenosis ([place]) 2011; 12/'13   a) Angiogram 02/03/12:  50-60%L RA stenosis, 40% R R Inferior artery; b) 12/'13: S/P L RA Stent ([facility]) 6.0 mm x 15 mm; c) Renal Duplex 10/2013: <60% L RA, <60 R RA, ~60% SMA & Celiac A.  . Stricture and stenosis of esophagus   .  Unspecified gastritis and gastroduodenitis without mention of hemorrhage     Past Surgical History:  Procedure Laterality Date  . APPENDECTOMY    . BACK SURGERY    . BREAST DUCTAL SYSTEM EXCISION     right  . BREAST SURGERY Right   . CARDIAC CATHETERIZATION N/A 03/16/2015   Procedure: Left Heart Cath and Coronary Angiography;  Surgeon: Leonie Man, MD;  Location: [place] [facility];  Service: Cardiovascular; Widely patent m-dLAD stents as wellas pRCA stent.  High LVEDP, small Diag & Om vessels with moderate stenosis   . Cardiac Event Monitor  01/2017   Mostly NSR with occasional sinus tachycardia and rare bradycardia. No A. fib. No PND or PSVT. Rare PACs and PVCs.  Marland Kitchen CARDIOVASCULAR STRESS TEST     11/07/11:  Normal perfusion pattern.  EF 66% No wall motion abnormalities.   Marland Kitchen CATARACT EXTRACTION W/ INTRAOCULAR LENS  IMPLANT, BILATERAL Bilateral   . CHOLECYSTECTOMY OPEN    . COLONOSCOPY  03/01/2009   normal   . CORONARY ANGIOPLASTY WITH STENT PLACEMENT  07/2010, 02/2011   LHC: 07/30/10.  3.0x39mm Integrity BMS to RCA and 2.5x 43mm Integrity BMS LAD.   03/2011: LHC- ISRS in LAD treated with overlapping Promus DES 2.5x19mm and PTCA of Diagonal.  02/03/12:  LHC- patent stents.  Jailed diagonal.  . DOBUTAMINE STRESS ECHO  03/07/2015   [facility] (ordered for pre-op evaluation for EUS/ERCP --> abnormal EKG:  strreesss test shhoowwed 1 mm ST segment depressions downsloping. No wall motion abnormalities at peak exercise or at rest. Diastolic dysfunction was noted but normal systolic function - EF greater than 55%. No bouts of regurgitation or stenosis. Resting hypertension with exaggerated response  . ESOPHAGOGASTRODUODENOSCOPY  04/08/2012  . ESOPHAGOGASTRODUODENOSCOPY (EGD) WITH ESOPHAGEAL DILATION  X 2  . FRACTURE SURGERY    . HAMMER TOE SURGERY Bilateral    "took bone off the top of 2nd toe on each foot"  . HIP SURGERY Left    "something to do w/my back"  . JOINT REPLACEMENT    . KNEE SURGERY Left    "had fluid drained off it a couple times"  . LEFT HEART CATHETERIZATION WITH CORONARY ANGIOGRAM N/A 02/03/2012   Procedure: LEFT HEART CATHETERIZATION WITH CORONARY ANGIOGRAM;  Surgeon: Leonie Man, MD;  Location: [facility] CATH LAB;  Service: Cardiovascular;  Laterality: N/A;  . LEFT HEART CATHETERIZATION WITH CORONARY ANGIOGRAM N/A 05/12/2014   Procedure: LEFT HEART CATHETERIZATION WITH CORONARY ANGIOGRAM;  Surgeon: Lorretta Harp, MD;  Location: [facility] CATH LAB;  Service: Cardiovascular: Stable CAD. Patent stents. Patent renal artery stent  . LEFT HEART CATHETERIZATION WITH CORONARY ANGIOGRAM  08/2012   Peri-Op MI @ [facility] -- 40% ostial D1, patent LAD stents, 10% RCA ISR  . LEFT HEART CATHETERIZATION WITH CORONARY ANGIOGRAM   03/2012   ~30% ISR RCA, patent LAD stent & D2; EDP ~28 mmHg  . NM MYOVIEW LTD  12/2016   LOW RISK study. No ischemia or infarction. EF 65-75%.  Marland Kitchen PANCREAS SURGERY     "stent in my pancreas"; Dr Pershing Proud  . PERCUTANEOUS PINNING PHALANX FRACTURE OF HAND Left ~ 2013  . PERIPHERAL VASCULAR BALLOON ANGIOPLASTY  09/09/2017    Procedure: PERIPHERAL VASCULAR BALLOON ANGIOPLASTY;  Surgeon: Serafina Mitchell, MD;  Location: [place] [facility];  Service: Cardiovascular;;  Celiac instent  . PERIPHERAL VASCULAR INTERVENTION  01/14/2017   Procedure: Peripheral Vascular Intervention;  Surgeon: Serafina Mitchell, MD;  Location: [place] [facility];  Service: Cardiovascular;;  mesentric  . RENAL ARTERY STENT Left 08/2012   @ [facility] - 6.0 mm x 15 mm  . SPINE SURGERY     tumor removed 07/2010; Redo Surgery 04/2012; Sacroiliac Sgx 08/2013  . TOTAL ABDOMINAL HYSTERECTOMY    . TOTAL KNEE ARTHROPLASTY Left ~ 2012  . VISCERAL ANGIOGRAM N/A 05/12/2014   Procedure: VISCERAL ANGIOGRAM;  Surgeon: Lorretta Harp, MD;  Location: [facility] CATH LAB;  Service: Cardiovascular;  Laterality: N/A;  . VISCERAL ANGIOGRAPHY N/A 01/14/2017   Procedure: Mesenteric  Angiography;  Surgeon: Serafina Mitchell, MD;  Location: [place] [facility];  Service: Cardiovascular;  Laterality: N/A;  . VISCERAL ANGIOGRAPHY N/A 09/09/2017   Procedure: VISCERAL ANGIOGRAPHY;  Surgeon: Serafina Mitchell, MD;  Location: [place] [facility];  Service: Cardiovascular;  Laterality: N/A;    Current Meds  Medication Sig  . acetaminophen (TYLENOL) 500 MG tablet Take 2,000 mg by mouth 4 (four) times daily as needed for moderate pain.   Marland Kitchen amitriptyline (ELAVIL) 25 MG tablet TAKE 1 TABLET(25 MG) BY MOUTH AT BEDTIME (Patient taking differently: TAKE 1 TABLET(25 MG) BY MOUTH AT BEDTIME AS NEEDED FOR SLEEP)  . Artificial Tear Solution (TEARS RENEWED OP) Apply 1 drop to eye daily as needed (dry eyes).  . carvedilol (COREG) 6.25 MG tablet Take 1 tablet (6.25 mg total) by mouth 2 (two) times daily with a meal. May take an extra tablet daily if having palpations  . clopidogrel (PLAVIX) 75 MG tablet TAKE 1 TABLET(75 MG) BY MOUTH DAILY  . furosemide (LASIX) 80 MG tablet Take 80 mg by mouth.  . indapamide (LOZOL) 2.5 MG tablet TAKE 1 TABLET(2.5 MG TOTAL) BY MOUTH DAILY, 30 MINUTES BEFORE  DAILY FUROSEMIDE  . lidocaine (XYLOCAINE) 5 % ointment Apply 1 application topically at bedtime as needed (toe pain).   . Lidocaine 4 % PTCH Apply 1 patch topically daily as needed (pain).  Marland Kitchen losartan (COZAAR) 50 MG tablet TAKE 1 TABLET(50 MG) BY MOUTH DAILY  . nitroGLYCERIN (NITROSTAT) 0.4 MG SL tablet DISSOLVE 1 TABLET UNDER THE TONGUE EVERY 5 MINUTES AS NEEDED FOR CHEST PAIN  . potassium chloride (MICRO-K) 10 MEQ CR capsule Take 2 capsules (20 mEq total) by mouth 2 (two) times daily.  Marland Kitchen spironolactone (ALDACTONE) 25 MG tablet TAKE 1 TABLET BY MOUTH EVERY MONDAY, WEDNESDAY, AND FRIDAY AT 6 PM    Allergies  Allergen Reactions  . Codeine Anaphylaxis  . Codeine Phosphate Anaphylaxis, Shortness Of Breath and Swelling  . Contrast Media [Iodinated Diagnostic Agents]     swelling  . Penicillin G Anaphylaxis  . Rosuvastatin Calcium Other (See Comments)    Intolerant  . Iodine Swelling and Rash    REACTION: unspecified This allergy is to Topical iodine only.   . Prednisone Itching and Rash    Keeps pt up all night   . Statins Other (See Comments)    Intolerant  Intolerant  . Tramadol Other (See Comments) and Palpitations    Kept patient awake Tramadol "makes me hyper".    Social History   Tobacco Use  . Smoking status: Never Smoker  . Smokeless tobacco: Never Used  Substance Use Topics  . Alcohol use: No  . Drug use: No   Social History   Social History Narrative   Married mother of one, one grandchild.   Very socially active, enjoys cooking and having get-togethers her house. She had been exercising regularly but her back pain limits her.   Does not  smoke, does not drink alcohol.    Family History family history includes Cancer in her father; Colitis in her maternal grandfather; Diabetes in her brother; Heart attack in her brother, brother, brother, and father; Heart disease in her father; Stroke in her brother and mother.  Wt Readings from Last 3 Encounters:  10/22/18  196 lb 3.2 oz (89 kg)  09/21/18 171 lb (77.6 kg)  04/23/18 191 lb (86.6 kg)    PHYSICAL EXAM BP (!) 142/60   Pulse 74   Ht 5\' 8"  (1.727 m)   Wt 196 lb 3.2 oz (89 kg)   BMI 29.83 kg/m  Physical Exam  Constitutional: She is oriented to person, place, and time. She appears well-developed and well-nourished. No distress (Tired and ill-appearing. But not toxic.).  She actually looks healthier.  Her weight is more to her baseline.  Still not quite in the obese category though.  HENT:  Head: Normocephalic and atraumatic.  Neck: Normal range of motion. Neck supple. No hepatojugular reflux and no JVD present. Carotid bruit is not present (Cannot exclude soft bruit on the right).  Cardiovascular: Normal rate, regular rhythm, S1 normal, S2 normal and intact distal pulses.  Occasional extrasystoles are present. PMI is not displaced (Very difficult to palpate). Exam reveals gallop, S4 (Soft) and distant heart sounds.  Murmur heard.  Medium-pitched harsh crescendo-decrescendo early systolic murmur is present with a grade of 1/6 at the upper right sternal border radiating to the neck. Pulmonary/Chest: Effort normal and breath sounds normal. No respiratory distress. She has no wheezes. She has no rales.  Abdominal: Soft. Bowel sounds are normal. She exhibits no distension. There is no abdominal tenderness. There is no rebound.  Musculoskeletal: Normal range of motion.        General: Edema (Trivial) present.  Neurological: She is alert and oriented to person, place, and time.  Psychiatric: She has a normal mood and affect. Her behavior is normal. Judgment and thought content normal.  Answers questions appropriately  Nursing note and vitals reviewed.   Adult ECG Report  Rate: 74 ;  Rhythm: normal sinus rhythm and Normal axis, intervals durations.  Mild nonspecific ST of T wave changes.  Otherwise normal.;   Narrative Interpretation: Normal/stable EKG.   Other studies Reviewed: Additional studies/  records that were reviewed today include:  Recent Labs:   I do not have labs from her PCP --> she has stopped getting her lipids checked, because she has declined treatment with any antilipid medication such as statins, Zetia and even PCSK9 inhibitors.  Lab Results  Component Value Date   CREATININE 0.82 09/09/2017   BUN 21 (H) 09/09/2017   NA 135 09/09/2017   K 3.3 (L) 09/09/2017   CL 98 (L) 09/09/2017   CO2 26 09/09/2017    ASSESSMENT / PLAN: Problem List Items Addressed This Visit    CAD S/P percutaneous coronary angioplasty: pRCA BMS, mLAD BMS overlapped prox with DES for ISR - Primary (Chronic)    Most recent PCI was in July 2012, she has had subsequent cath in 2013 and 2016 that have shown mild to moderate disease but no requirement for PCI.  Patent LAD and RCA stents.  She did have elevated LVEDP suggestive of diastolic dysfunction.  Normal EF.  She has intermittent chest discomfort spells but these have been evaluated over and over again and most recently with a negative Myoview. She is on a stable dose of carvedilol which I am reluctant to increase despite her having some more  palpitations, partly because of orthostasis. She is on standing dose Plavix which is as much for her CAD as it is for her PAD.  (Consider converting to baby aspirin plus low-dose Xarelto). In addition to the beta-blocker she is on ARB and spironolactone. She is not on any lipid medication, for reasons noted above she simply refuses to consider taking a statin or any other cholesterol medicine because of concern for side effects.  She does not even want a try PCSK9 inhibitors. --Without being the case, I see no reason to continue to follow lipids.  She understands the risk.      Relevant Orders   EKG 12-Lead   Congestive heart failure with LV diastolic dysfunction, NYHA class 2 (HCC) (Chronic)    Unfortunately, in addition have some diastolic dysfunction requiring standing dose diuretic as well as ARB and  beta-blocker plus spironolactone, she is now noticing orthostatic type symptoms which prevents [place] from increasing either the losartan or carvedilol.  She continues to use PRN Zaroxolyn for worsening swelling (is not noted on her med list, but she still has it)      Dyslipidemia, goal LDL below 70 - WITH STATIN INTOLERANCE (Chronic)    As noted: She declines any treatment options and refused to take medications. We have had multiple discussions about this in the past.  She understands the risks as we have discussed.  I would not continue to try to manage since she refuses to try new options.      Essential hypertension (Chronic)    Blood pressure is borderline elevated today and I was hoping to be L to increase carvedilol, but she complains of orthostatic dizziness.  Therefore I think we will just leave it alone.      Relevant Orders   EKG 12-Lead   Exertional dyspnea (Chronic)    At this point, she does have some diastolic dysfunction, but an obvious reason for exertional dyspnea, I think it is more related to her deconditioning.      Palpitation (Chronic)    Well-controlled.  At present, will continue current dose carvedilol, but if they do increase, I would probably increase the dose.         Current medicines are reviewed at length with the patient today. (+/- concerns) n/a The following changes have been made: n/a  Patient Instructions  Medication Instructions:   START TAKING LOSARTAN AT LUNCHTIME    SEE BELOW If you need a refill on your cardiac medications before your next appointment, please call your pharmacy.   Lab work: NOT NEEDED If you have labs (blood work) drawn today and your tests are completely normal, you will receive your results only by: Marland Kitchen MyChart Message (if you have MyChart) OR . A paper copy in the mail If you have any lab test that is abnormal or we need to change your treatment, we will call you to review the results.  Testing/Procedures: NOT  NEEDED  Follow-Up: At [facility], you and your health needs are our priority.  As part of our continuing mission to provide you with exceptional heart care, we have created designated Provider Care Teams.  These Care Teams include your primary Cardiologist (physician) and Advanced Practice Providers (APPs -  Physician Assistants and Nurse Practitioners) who all work together to provide you with the care you need, when you need it. You will need a follow up appointment in 6 months AUG 2020.  Please call our office 2 months in advance to schedule this  appointment.  You may see Glenetta Hew, MD or one of the following Advanced Practice Providers on your designated Care Team:   Rosaria Ferries, PA-C . Jory Sims, DNP, ANP  Any Other Special Instructions Will Be Listed Below (If Applicable).  IF YOU HAVE SOME PALPATIONS- MAY TAKE  1/2 TABLET OF CARVEDILOL ( OF THE NEXT DOSE). HOLD TAKING LOSARTAN AT THAT TIME.    Studies Ordered:   Orders Placed This Encounter  Procedures  . EKG 12-Lead      Glenetta Hew, M.D., M.S. Interventional Cardiologist   Pager # [phone] Phone # [phone] [address] [place], [place] [address]

## 2018-10-24 ENCOUNTER — Encounter: Payer: Self-pay | Admitting: Cardiology

## 2018-10-24 NOTE — Assessment & Plan Note (Signed)
At this point, she does have some diastolic dysfunction, but an obvious reason for exertional dyspnea, I think it is more related to her deconditioning.

## 2018-10-24 NOTE — Assessment & Plan Note (Signed)
Well-controlled.  At present, will continue current dose carvedilol, but if they do increase, I would probably increase the dose.

## 2018-10-24 NOTE — Assessment & Plan Note (Signed)
Blood pressure is borderline elevated today and I was hoping to be L to increase carvedilol, but she complains of orthostatic dizziness.  Therefore I think we will just leave it alone.

## 2018-10-24 NOTE — Assessment & Plan Note (Signed)
Most recent PCI was in July 2012, she has had subsequent cath in 2013 and 2016 that have shown mild to moderate disease but no requirement for PCI.  Patent LAD and RCA stents.  She did have elevated LVEDP suggestive of diastolic dysfunction.  Normal EF.  She has intermittent chest discomfort spells but these have been evaluated over and over again and most recently with a negative Myoview. She is on a stable dose of carvedilol which I am reluctant to increase despite her having some more palpitations, partly because of orthostasis. She is on standing dose Plavix which is as much for her CAD as it is for her PAD.  (Consider converting to baby aspirin plus low-dose Xarelto). In addition to the beta-blocker she is on ARB and spironolactone. She is not on any lipid medication, for reasons noted above she simply refuses to consider taking a statin or any other cholesterol medicine because of concern for side effects.  She does not even want a try PCSK9 inhibitors. --Without being the case, I see no reason to continue to follow lipids.  She understands the risk.

## 2018-10-24 NOTE — Assessment & Plan Note (Signed)
Unfortunately, in addition have some diastolic dysfunction requiring standing dose diuretic as well as ARB and beta-blocker plus spironolactone, she is now noticing orthostatic type symptoms which prevents [place] from increasing either the losartan or carvedilol.  She continues to use PRN Zaroxolyn for worsening swelling (is not noted on her med list, but she still has it)

## 2018-10-24 NOTE — Assessment & Plan Note (Signed)
As noted: She declines any treatment options and refused to take medications. We have had multiple discussions about this in the past.  She understands the risks as we have discussed.  I would not continue to try to manage since she refuses to try new options.

## 2018-10-28 DIAGNOSIS — M461 Sacroiliitis, not elsewhere classified: Secondary | ICD-10-CM | POA: Diagnosis not present

## 2018-10-28 DIAGNOSIS — R109 Unspecified abdominal pain: Secondary | ICD-10-CM | POA: Diagnosis not present

## 2018-11-11 ENCOUNTER — Other Ambulatory Visit: Payer: Self-pay | Admitting: Cardiology

## 2018-11-26 DIAGNOSIS — M542 Cervicalgia: Secondary | ICD-10-CM | POA: Diagnosis not present

## 2018-11-26 DIAGNOSIS — M546 Pain in thoracic spine: Secondary | ICD-10-CM | POA: Diagnosis not present

## 2018-11-26 DIAGNOSIS — M545 Low back pain: Secondary | ICD-10-CM | POA: Diagnosis not present

## 2019-02-05 ENCOUNTER — Other Ambulatory Visit: Payer: Self-pay | Admitting: Cardiology

## 2019-02-23 DIAGNOSIS — E1142 Type 2 diabetes mellitus with diabetic polyneuropathy: Secondary | ICD-10-CM | POA: Diagnosis not present

## 2019-02-23 DIAGNOSIS — M2041 Other hammer toe(s) (acquired), right foot: Secondary | ICD-10-CM | POA: Diagnosis not present

## 2019-02-23 DIAGNOSIS — M2042 Other hammer toe(s) (acquired), left foot: Secondary | ICD-10-CM | POA: Diagnosis not present

## 2019-02-23 DIAGNOSIS — M2011 Hallux valgus (acquired), right foot: Secondary | ICD-10-CM | POA: Diagnosis not present

## 2019-02-23 DIAGNOSIS — M2012 Hallux valgus (acquired), left foot: Secondary | ICD-10-CM | POA: Diagnosis not present

## 2019-02-23 DIAGNOSIS — M19272 Secondary osteoarthritis, left ankle and foot: Secondary | ICD-10-CM | POA: Diagnosis not present

## 2019-03-15 ENCOUNTER — Other Ambulatory Visit: Payer: Self-pay

## 2019-03-15 DIAGNOSIS — K551 Chronic vascular disorders of intestine: Secondary | ICD-10-CM

## 2019-03-15 DIAGNOSIS — I701 Atherosclerosis of renal artery: Secondary | ICD-10-CM

## 2019-03-22 ENCOUNTER — Ambulatory Visit (INDEPENDENT_AMBULATORY_CARE_PROVIDER_SITE_OTHER)
Admission: RE | Admit: 2019-03-22 | Discharge: 2019-03-22 | Disposition: A | Discharge status: Home / Self Care | Payer: Medicare Other | Source: Ambulatory Visit | Attending: Family | Admitting: Family

## 2019-03-22 ENCOUNTER — Ambulatory Visit (HOSPITAL_COMMUNITY)
Admission: RE | Admit: 2019-03-22 | Discharge: 2019-03-22 | Disposition: A | Discharge status: Home / Self Care | Payer: Medicare Other | Source: Ambulatory Visit | Attending: Family | Admitting: Family

## 2019-03-22 ENCOUNTER — Encounter: Payer: Self-pay | Admitting: Family

## 2019-03-22 ENCOUNTER — Ambulatory Visit (INDEPENDENT_AMBULATORY_CARE_PROVIDER_SITE_OTHER): Payer: Medicare Other | Admitting: Family

## 2019-03-22 ENCOUNTER — Other Ambulatory Visit: Payer: Self-pay

## 2019-03-22 VITALS — BP 146/59 | HR 74 | Temp 97.6°F | Resp 16 | Ht 68.0 in | Wt 167.0 lb

## 2019-03-22 DIAGNOSIS — I771 Stricture of artery: Secondary | ICD-10-CM

## 2019-03-22 DIAGNOSIS — I774 Celiac artery compression syndrome: Secondary | ICD-10-CM | POA: Diagnosis not present

## 2019-03-22 DIAGNOSIS — I701 Atherosclerosis of renal artery: Secondary | ICD-10-CM

## 2019-03-22 DIAGNOSIS — K551 Chronic vascular disorders of intestine: Secondary | ICD-10-CM

## 2019-03-22 DIAGNOSIS — Z9862 Peripheral vascular angioplasty status: Secondary | ICD-10-CM | POA: Diagnosis not present

## 2019-03-22 NOTE — Patient Instructions (Signed)
Before your next abdominal ultrasound:  Avoid gas forming foods and beverages the day before the test.   Take two Extra-Strength Gas-X capsules at bedtime the night before the test. Take another two Extra-Strength Gas-X capsules in the middle of the night if you get up to the restroom, if not, first thing in the morning with water.  Do not chew gum.     

## 2019-03-22 NOTE — Progress Notes (Signed)
CC: Follow up Celiac and Renal Artery stenosis    History of Present Illness  [name] is a 78 y.o. (April 30, 1941) female who who is s/p angioplasty of celiac artery on 09/09/17 by Dr. Trula Slade for celiac artery in stent stenosis.    She has had a long hx of abdominal pain.  As a last ditch effort, it was felt treating a 90% celiac artery stenosis may help alleviate some of her sx and was done in 2018 with a 87mm balloon expandable stent.  She did not have any changes in her sx. She has occasional post prandial abdominal pain.   She has diarrhea every day, etiology is suspected to be from her inability to produce enough pancreatic enzymes.    She is trying to lose weight, and states her leg swelling has decreased with the weight loss.   At her last visit, she did have some leg swelling and her venous reflux study was essentially normal and she was counseled to wear compression stockings and return in 6 months.  She saw Dr. Ellyn Hack in August and she had less edema with lasix and spironolactone.    She has a hx of cholecystectomy at age 71.  She has been diagnosed with pancreatic divisum and has had stents placed ~ 20 years ago at [facility].  She also has a hx of left renal artery stent placed by cardiologist in [facility] and is followed by Dr. Ellyn Hack.   She presents today for follow up.    She states that her abdomen is the same.  She has pain that has not changed and worse after she has been up cooking or up and about for an extended period of time.  She states that it does not worsen with food.  She states that she does have some swelling in her legs and states it has gotten better with the fluid pills.  It is better with elevation and improves in the morning after she has been in bed.  She does not wear compression socks but does have them.  Dr. Ellyn Hack continues to follow her blood pressure.   She was last evaluated by S. Rhyne PA-C on 09-21-18. At that time her abdominal pain continued  to be unchanged.  Her mesenteric study showed celiac artery origin with elevated velocities compared to the previous visit, but given pain was unchanged and not related to eating, will continue to monitor and see her back in 6 months.  Renal artery duplex was essentially unchanged and blood pressure was fairly well controlled on current meds.  Dr. Ellyn Hack to continue to monitor. She was to follow up in 6 months with repeat renal artery duplex and mesenteric duplex.  She continued to have pain in her left side that has been present since about 2015.  She has seen GI and vascular.  She was advised to f/u with her PCP to see what next step would be for evaluation.  Diabetic: no Tobacco use: never  She takes Plavix and a beta blocker, she is statin intolerant.   Past Medical History:  Diagnosis Date   Arthritis    "fingers, right shoulder" (09/09/2017)   Atrial fibrillation (HCC)    Back pain with radiation    Chronic Back Pain - mutliple surgeries (including tumor removal)   Bilateral edema of lower extremity    Chronic, likely related to venous stasis   CAD S/P percutaneous coronary angioplasty    a) LHC: 07/30/10. -- 3.0x8mm Integrity BMS  to pRCA & 2.5x 45mm Integrity BMS mLAD(@ D2).  b) Class III-IV Angina 03/2011: LHC- ISR in LAD BMS -- prox overlapping Promus DES 2.5x81mm and PTCA of jailed D2 ostium-prox 80%. c) 02/03/12:  LHC- patent stents.  Jailed diagonal. with stable flow; d) Peri-OP NSTEMI 04/2012 - LHC in 12/'13 -    Celiac artery stenosis ([place])    12/2016 - STENT placement   Complication of anesthesia    "used to wake up wild years ago" (09/09/2017)   Congestive heart failure with LV diastolic dysfunction, NYHA class 2 ([place])    06/13/10:  last 2D echo-  EF >55%, Mild TR, Mod Conc LVH - Grade 1 diastolic dysfunction (abnormal relaxation) --> LVEDP on Cath 28 mmHg & mild 2nd Pulm HTN   Diet-controlled type 2 diabetes mellitus ([place])    Diverticulitis of colon (without mention of  hemorrhage)(562.11)    Dyslipidemia, goal LDL below 70    Intolerant to statins   GERD (gastroesophageal reflux disease)    Hepatitis ~ 1957   "yellow jaundice" (01/14/2017)   Hepatomegaly    Hiatal hernia    History of blood transfusion 04/2012   "when I had a heart attack"   History of kidney stones    "I've got a stone embedded in one of my kidneys" (09/09/2017)   History of stomach ulcers "years ago"   Irritable bowel syndrome (IBS)    Labile essential hypertension    Partially related to RAS   Mesenteric artery stenosis (HCC)    95% Celiac Artery - ostial, 20-30% SMA.  Bilateral Renal A: L RA stent patent, R RA 20-30% -- Conservative Management   NSTEMI (non-ST elevated myocardial infarction) ([place]) 04/2012   Unclear the details, apparently this was postoperative from her back surgery that she was cleared for my last saw her in June.  Reportedly had stents placed   Pancreas divisum    On pancreatic enzyme   Renal artery stenosis ([place]) 2011; 12/'13   a) Angiogram 02/03/12:  50-60%L RA stenosis, 40% R R Inferior artery; b) 12/'13: S/P L RA Stent ([facility]) 6.0 mm x 15 mm; c) Renal Duplex 10/2013: <60% L RA, <60 R RA, ~60% SMA & Celiac A.   Stricture and stenosis of esophagus    Unspecified gastritis and gastroduodenitis without mention of hemorrhage     Social History Social History   Tobacco Use   Smoking status: Never Smoker   Smokeless tobacco: Never Used  Substance Use Topics   Alcohol use: No   Drug use: No    Family History Family History  Problem Relation Age of Onset   Cancer Father        mets   Heart attack Father    Heart disease Father    Stroke Mother    Heart attack Brother    Heart attack Brother    Colitis Maternal Grandfather    Diabetes Brother    Stroke Brother    Heart attack Brother    Colon cancer Neg Hx    Stomach cancer Neg Hx     Surgical History Past Surgical History:  Procedure Laterality Date    APPENDECTOMY     BACK SURGERY     BREAST DUCTAL SYSTEM EXCISION     right   BREAST SURGERY Right    CARDIAC CATHETERIZATION N/A 03/16/2015   Procedure: Left Heart Cath and Coronary Angiography;  Surgeon: Leonie Man, MD;  Location: [place] [facility];  Service: Cardiovascular; Widely patent  m-dLAD stents as wellas pRCA stent.  High LVEDP, small Diag & Om vessels with moderate stenosis    Cardiac Event Monitor  01/2017   Mostly NSR with occasional sinus tachycardia and rare bradycardia. No A. fib. No PND or PSVT. Rare PACs and PVCs.   CARDIOVASCULAR STRESS TEST     11/07/11:  Normal perfusion pattern.  EF 66% No wall motion abnormalities.    CATARACT EXTRACTION W/ INTRAOCULAR LENS  IMPLANT, BILATERAL Bilateral    CHOLECYSTECTOMY OPEN     COLONOSCOPY  03/01/2009   normal    CORONARY ANGIOPLASTY WITH STENT PLACEMENT  07/2010, 02/2011   LHC: 07/30/10.  3.0x74mm Integrity BMS to RCA and 2.5x 68mm Integrity BMS LAD.  03/2011: LHC- ISRS in LAD treated with overlapping Promus DES 2.5x56mm and PTCA of Diagonal.  02/03/12:  LHC- patent stents.  Jailed diagonal.   DOBUTAMINE STRESS ECHO  03/07/2015   [facility] (ordered for pre-op evaluation for EUS/ERCP --> abnormal EKG:  strreesss test shhoowwed 1 mm ST segment depressions downsloping. No wall motion abnormalities at peak exercise or at rest. Diastolic dysfunction was noted but normal systolic function - EF greater than 55%. No bouts of regurgitation or stenosis. Resting hypertension with exaggerated response   ESOPHAGOGASTRODUODENOSCOPY  04/08/2012   ESOPHAGOGASTRODUODENOSCOPY (EGD) WITH ESOPHAGEAL DILATION  X 2   FRACTURE SURGERY     HAMMER TOE SURGERY Bilateral    "took bone off the top of 2nd toe on each foot"   HIP SURGERY Left    "something to do w/my back"   JOINT REPLACEMENT     KNEE SURGERY Left    "had fluid drained off it a couple times"   LEFT HEART CATHETERIZATION WITH CORONARY ANGIOGRAM N/A 02/03/2012   Procedure: LEFT HEART  CATHETERIZATION WITH CORONARY ANGIOGRAM;  Surgeon: Leonie Man, MD;  Location: [facility] CATH LAB;  Service: Cardiovascular;  Laterality: N/A;   LEFT HEART CATHETERIZATION WITH CORONARY ANGIOGRAM N/A 05/12/2014   Procedure: LEFT HEART CATHETERIZATION WITH CORONARY ANGIOGRAM;  Surgeon: Lorretta Harp, MD;  Location[facility] C[facility] LAB;  Service: Cardiovascular: Stable CAD. Patent stents. Patent renal artery stent   LEFT HEART CATHETERIZATION WITH CORONARY ANGIOGRAM  08/2012   Peri-Op MI @ [facility] -- 40% ostial D1, patent LAD stents, 10% RCA ISR   LEFT HEART CATHETERIZATION WITH CORONARY ANGIOGRAM   03/2012   ~30% ISR RCA, patent LAD stent & D2; EDP ~28 mmHg   NM MYOVIEW LTD  12/2016   LOW RISK study. No ischemia or infarction. EF 65-75%.   PANCREAS SURGERY     "stent in my pancreas"; Dr Pershing Proud   PERCUTANEOUS PINNING PHALANX FRACTURE OF HAND Left ~ 2013   PERIPHERAL VASCULAR BALLOON ANGIOPLASTY  09/09/2017   Procedure: PERIPHERAL VASCULAR BALLOON ANGIOPLASTY;  Surgeon: Serafina Mitchell, MD;  Location: [place] [facility];  Service: Cardiovascular;;  Celiac instent   PERIPHERAL VASCULAR INTERVENTION  01/14/2017   Procedure: Peripheral Vascular Intervention;  Surgeon: Serafina Mitchell, MD;  Location: [place] [facility];  Service: Cardiovascular;;  mesentric   RENAL ARTERY STENT Left 08/2012   @ [facility] - 6.0 mm x 15 mm   SPINE SURGERY     tumor removed 07/2010; Redo Surgery 04/2012; Sacroiliac Sgx 08/2013   TOTAL ABDOMINAL HYSTERECTOMY     TOTAL KNEE ARTHROPLASTY Left ~ 2012   VISCERAL ANGIOGRAM N/A 05/12/2014   Procedure: VISCERAL ANGIOGRAM;  Surgeon: Lorretta Harp, MD;  Location: [facility] CATH LAB;  Service:  Cardiovascular;  Laterality: N/A;   VISCERAL ANGIOGRAPHY N/A 01/14/2017   Procedure: Mesenteric  Angiography;  Surgeon: Serafina Mitchell, MD;  Location: [place] [facility];  Service: Cardiovascular;  Laterality: N/A;   VISCERAL ANGIOGRAPHY N/A 09/09/2017   Procedure:  VISCERAL ANGIOGRAPHY;  Surgeon: Serafina Mitchell, MD;  Location: [place] [facility];  Service: Cardiovascular;  Laterality: N/A;    Allergies  Allergen Reactions   Codeine Anaphylaxis   Codeine Phosphate Anaphylaxis, Shortness Of Breath and Swelling   Contrast Media [Iodinated Diagnostic Agents]     swelling   Penicillin G Anaphylaxis   Rosuvastatin Calcium Other (See Comments)    Intolerant   Iodine Swelling and Rash    REACTION: unspecified This allergy is to Topical iodine only.    Prednisone Itching and Rash    Keeps pt up all night    Statins Other (See Comments)    Intolerant  Intolerant   Tramadol Other (See Comments) and Palpitations    Kept patient awake Tramadol "makes me hyper".    Current Outpatient Medications  Medication Sig Dispense Refill   acetaminophen (TYLENOL) 500 MG tablet Take 2,000 mg by mouth 4 (four) times daily as needed for moderate pain.      amitriptyline (ELAVIL) 25 MG tablet TAKE 1 TABLET(25 MG) BY MOUTH AT BEDTIME (Patient taking differently: TAKE 1 TABLET(25 MG) BY MOUTH AT BEDTIME AS NEEDED FOR SLEEP) 30 tablet 3   Artificial Tear Solution (TEARS RENEWED OP) Apply 1 drop to eye daily as needed (dry eyes).     carvedilol (COREG) 6.25 MG tablet Take 1 tablet (6.25 mg total) by mouth 2 (two) times daily with a meal. May take an extra tablet daily if having palpations 90 tablet 6   clopidogrel (PLAVIX) 75 MG tablet TAKE 1 TABLET(75 MG) BY MOUTH DAILY 30 tablet 11   diclofenac sodium (VOLTAREN) 1 % GEL Apply small amount sparingly to the right and left footl     furosemide (LASIX) 80 MG tablet Take 80 mg by mouth.     indapamide (LOZOL) 2.5 MG tablet TAKE 1 TABLET(2.5 MG TOTAL) BY MOUTH DAILY, 30 MINUTES BEFORE DAILY FUROSEMIDE 30 tablet 7   lidocaine (XYLOCAINE) 5 % ointment Apply 1 application topically at bedtime as needed (toe pain).      lidocaine (XYLOCAINE) 5 % ointment Apply small amount to the bottom of the right foot 2-3  times per day     Lidocaine 4 % PTCH Apply 1 patch topically daily as needed (pain).     losartan (COZAAR) 50 MG tablet TAKE 1 TABLET(50 MG) BY MOUTH DAILY 90 tablet 2   nitroGLYCERIN (NITROSTAT) 0.4 MG SL tablet DISSOLVE 1 TABLET UNDER THE TONGUE EVERY 5 MINUTES AS NEEDED FOR CHEST PAIN 25 tablet 4   potassium chloride (MICRO-K) 10 MEQ CR capsule Take 2 capsules (20 mEq total) by mouth 2 (two) times daily. 120 capsule 6   spironolactone (ALDACTONE) 25 MG tablet TAKE 1 TABLET BY MOUTH EVERY MONDAY, WEDNESDAY, AND FRIDAY AT 6 PM 45 tablet 0   No current facility-administered medications for this visit.     ROS: see HPI for pertinent positives and negatives    Physical Examination  Vitals:   03/22/19 1000  BP: (!) 146/59  Pulse: 74  Resp: 16  Temp: 97.6 F (36.4 C)  TempSrc: Temporal  SpO2: 98%  Weight: 167 lb (75.8 kg)  Height: 5\' 8"  (1.727 m)   Body mass index is 25.39 kg/m.  General: A&O x  3, WDWN, female in NAD Gait: normal HEENT: no gross abnormalities  Pulmonary: Sym exp, good air movt, CTAB, no rales, rhonchi, or wheezing. Cardiac: RRR, Nl S1, S2, no detected Murmur.  Vascular: Vessel Right Left  Radial Palpable Palpable  Carotid  without bruit  without bruit  Aorta Not palpable N/A  Popliteal Not palpable Not palpable  PT 1+Palpable 1+Palpable  DP 1+Palpable Not Palpable   Gastrointestinal: soft, mildly tender to palpation in all quadrants, -G/R, - HSM, - masses, - CVAT. Musculoskeletal: M/S 5/5 throughout, Extremities without ischemic changes Neurologic: Pain and light touch intact in extremities, Motor exam as listed above. CN 2-12 intact.  Skin: No rash, no cellulitis, no ulcers noted.  Psychiatric: Normal thought content, mood appropriate to clinical situation.     Non-Invasive Vascular Imaging  Mesenteric Duplex (Date: 03/22/2019):  Duplex Findings: +--------------------+--------+--------+------+-------------------+  Mesenteric           PSV  cm/s EDV cm/s Plaque      Comments        +--------------------+--------+--------+------+-------------------+  Aorta at Celiac        189                                         +--------------------+--------+--------+------+-------------------+  Celiac Artery Origin   249                                         +--------------------+--------+--------+------+-------------------+  SMA Origin             240                                         +--------------------+--------+--------+------+-------------------+  SMA Proximal           361                                         +--------------------+--------+--------+------+-------------------+  SMA Mid                163                                         +--------------------+--------+--------+------+-------------------+  SMA Distal              81                                         +--------------------+--------+--------+------+-------------------+  IMA                    218                    not well visualized  +--------------------+--------+--------+------+-------------------+ Summary: Mesenteric: 70 to 99% stenosis in the celiac artery and superior mesenteric artery. No significant change when compared to previous exam.  Renal Artery Duplex (03-22-19): Duplex Findings:  +------------------+--------+--------+-------+  Right Renal Artery PSV cm/s EDV cm/s Comment  +------------------+--------+--------+-------+  Origin  226       28             +------------------+--------+--------+-------+  Proximal             149       23             +------------------+--------+--------+-------+  Mid                  130       27             +------------------+--------+--------+-------+  Distal               150       18             +------------------+--------+--------+-------+  +-----------------+--------+--------+-------+  Left Renal Artery PSV cm/s EDV  cm/s Comment  +-----------------+--------+--------+-------+  Mid                  66       11             +-----------------+--------+--------+-------+  Distal               64       14             +-----------------+--------+--------+-------+  +------------+--------+--------+----+-----------+--------+--------+----+  Right Kidney PSV cm/s EDV cm/s RI   Left Kidney PSV cm/s EDV cm/s RI    +------------+--------+--------+----+-----------+--------+--------+----+  Upper Pole                          Upper Pole                          +------------+--------+--------+----+-----------+--------+--------+----+  Mid          39       9        0.77 Mid         58       10       0.83  +------------+--------+--------+----+-----------+--------+--------+----+  Lower Pole   1                      Lower Pole  1                       +------------+--------+--------+----+-----------+--------+--------+----+  Hilar                               Hilar                               +------------+--------+--------+----+-----------+--------+--------+----+  +------------------+-------+------------------+-------+  Right Kidney               Left Kidney                 +------------------+-------+------------------+-------+  RAR                        RAR                         +------------------+-------+------------------+-------+  RAR (manual)               RAR (manual)                +------------------+-------+------------------+-------+  Cortex  24/6    Cortex             37/7     +------------------+-------+------------------+-------+  Cortex thickness   1.12 mm Corex thickness    1.11 mm  +------------------+-------+------------------+-------+  Kidney length (cm) 10.23   Kidney length (cm) 10.88    +------------------+-------+------------------+-------+   Summary: Renal:  Right: 1-59% stenosis of the right renal artery. RRV flow present.        No significant change compared to  previous exam. Left:  LRV flow present. Unable to visualize left renal artery ost        and prox segments due to significant bowel gas. Mesenteric: Areas of limited visceral study include left renal artery. See mesenteric study.    Medical Decision Making  [name] is a 78 y.o. female who iss/pceliac artery stenting on 01/14/2017. She continued to have abdominal pain after this. Currently she has occasional post prandial abdominal pain, especially after eating salads. And the pp abdominal pain is hit or miss.   Mesenteric artery duplex today shows no celiac PSV greater than 249 cm/s, highest SMA PSV is in the proximal segment at 361 cm/s.   Shehad a left renal artery stent placedby a cardiologist in [facility]. Her blood pressure has been in fairly good control. Renal artery duplex today shows no significant stenosis.   05-27-17 serum creatinine was 0.93  She is trying to lose weight, has less leg swelling with her weight loss.  Fortunately she does not have DM and has never used tobacco.  She takes Plavix, no ASA.She is statin intolerant.  She stays active, does canning. She entertains a great deal.   Based on her exam and studies, and after discussing with Dr. Trula Slade, I have offered the patient return in 1 year with bilateral renal artery and mesenteric artery duplex, see me afterward on a day that Dr. Trula Slade is in the office. I adivsed her to notify us if her post prandial abdominal pain becomes worse, we will see her sooner with a mesenteric artery duplex if that is the case.    Thank you for allowing [place] to participate in this patient's care.   Clemon Chambers, RN, MSN, FNP-C [facility] Office: [phone]  Clinic MD: Donzetta Matters on call  03/22/2019, 10:25 AM

## 2019-03-29 ENCOUNTER — Other Ambulatory Visit: Payer: Self-pay | Admitting: Cardiology

## 2019-04-09 ENCOUNTER — Other Ambulatory Visit: Payer: Self-pay | Admitting: Cardiology

## 2019-04-26 ENCOUNTER — Other Ambulatory Visit: Payer: Self-pay | Admitting: Cardiology

## 2019-04-26 MED ORDER — CARVEDILOL 6.25 MG PO TABS: 6.2500 mg | ORAL_TABLET | Freq: Two times a day (BID) | ORAL | 0 refills | Status: DC

## 2019-04-26 MED ORDER — FUROSEMIDE 80 MG PO TABS: 80.0000 mg | ORAL_TABLET | Freq: Every day | ORAL | 0 refills | Status: DC

## 2019-04-26 NOTE — Telephone Encounter (Signed)
Requested Prescriptions   Signed Prescriptions Disp Refills  . carvedilol (COREG) 6.25 MG tablet 60 tablet 0    Sig: Take 1 tablet (6.25 mg total) by mouth 2 (two) times daily with a meal. May take an extra tablet daily if having palpations    Authorizing Provider: Ellyn Hack, DAVID W    Ordering User: NEWCOMER MCCLAIN, Krysta Bloomfield L  . furosemide (LASIX) 80 MG tablet 30 tablet 0    Sig: Take 1 tablet (80 mg total) by mouth daily.    Authorizing Provider: Leonie Man    Ordering User: Raelene Bott, Sanjiv Castorena L

## 2019-04-26 NOTE — Telephone Encounter (Signed)
°*  STAT* If patient is at the pharmacy, call can be transferred to refill team.   1. Which medications need to be refilled? (please list name of each medication and dose if known) furosemide (LASIX) 80 MG tablet carvedilol (COREG) 6.25 MG tablet  2. Which pharmacy/location (including street and city if local pharmacy) is medication to be sent to? [id number] - [place], [place] - [address] AT [address]  3. Do they need a 30 day or 90 day supply? 32 DAYS  PATIENT IS OUT OF MEDS.

## 2019-05-12 ENCOUNTER — Other Ambulatory Visit: Payer: Self-pay | Admitting: Cardiology

## 2019-05-21 ENCOUNTER — Other Ambulatory Visit: Payer: Self-pay | Admitting: Cardiology

## 2019-05-24 ENCOUNTER — Other Ambulatory Visit: Payer: Self-pay | Admitting: Cardiology

## 2019-06-02 ENCOUNTER — Other Ambulatory Visit: Payer: Self-pay | Admitting: Cardiology

## 2019-07-20 ENCOUNTER — Ambulatory Visit: Payer: Medicare Other | Admitting: Cardiology

## 2019-08-13 ENCOUNTER — Other Ambulatory Visit: Payer: Self-pay | Admitting: Cardiology

## 2019-08-16 NOTE — Telephone Encounter (Signed)
   *  STAT* If patient is at the pharmacy, call can be transferred to refill team.   1. Which medications need to be refilled? (please list name of each medication and dose if known) spironolactone (ALDACTONE) 25 MG tablet  2. Which pharmacy/location (including street and city if local pharmacy) is medication to be sent to?  3. Do they need a 30 day or 90 day supply? [place]

## 2019-08-18 ENCOUNTER — Ambulatory Visit: Payer: Medicare Other | Admitting: Cardiology

## 2019-09-08 ENCOUNTER — Other Ambulatory Visit: Payer: Self-pay

## 2019-09-08 ENCOUNTER — Encounter: Payer: Self-pay | Admitting: Cardiology

## 2019-09-08 ENCOUNTER — Ambulatory Visit (INDEPENDENT_AMBULATORY_CARE_PROVIDER_SITE_OTHER): Payer: Medicare Other | Admitting: Cardiology

## 2019-09-08 VITALS — BP 168/72 | HR 71 | Temp 98.0°F | Ht 68.0 in | Wt 204.0 lb

## 2019-09-08 DIAGNOSIS — I87302 Chronic venous hypertension (idiopathic) without complications of left lower extremity: Secondary | ICD-10-CM

## 2019-09-08 DIAGNOSIS — R002 Palpitations: Secondary | ICD-10-CM

## 2019-09-08 DIAGNOSIS — I2510 Atherosclerotic heart disease of native coronary artery without angina pectoris: Secondary | ICD-10-CM

## 2019-09-08 DIAGNOSIS — I5030 Unspecified diastolic (congestive) heart failure: Secondary | ICD-10-CM | POA: Diagnosis not present

## 2019-09-08 DIAGNOSIS — I701 Atherosclerosis of renal artery: Secondary | ICD-10-CM | POA: Diagnosis not present

## 2019-09-08 DIAGNOSIS — E785 Hyperlipidemia, unspecified: Secondary | ICD-10-CM

## 2019-09-08 DIAGNOSIS — I10 Essential (primary) hypertension: Secondary | ICD-10-CM | POA: Diagnosis not present

## 2019-09-08 DIAGNOSIS — R0609 Other forms of dyspnea: Secondary | ICD-10-CM

## 2019-09-08 DIAGNOSIS — Z9861 Coronary angioplasty status: Secondary | ICD-10-CM | POA: Diagnosis not present

## 2019-09-08 DIAGNOSIS — R0600 Dyspnea, unspecified: Secondary | ICD-10-CM

## 2019-09-08 LAB — COMPREHENSIVE METABOLIC PANEL
ALT: 11 IU/L (ref 0–32)
AST: 12 IU/L (ref 0–40)
Albumin/Globulin Ratio: 1.5 (ref 1.2–2.2)
Albumin: 4.3 g/dL (ref 3.7–4.7)
Alkaline Phosphatase: 91 IU/L (ref 39–117)
BUN/Creatinine Ratio: 28 (ref 12–28)
BUN: 26 mg/dL (ref 8–27)
Bilirubin Total: <0.2 mg/dL (ref 0.0–1.2)
CO2: 26 mmol/L (ref 20–29)
Calcium: 9.5 mg/dL (ref 8.7–10.3)
Chloride: 91 mmol/L — ABNORMAL LOW (ref 96–106)
Creatinine, Ser: 0.92 mg/dL (ref 0.57–1.00)
GFR calc Af Amer: 69 mL/min/{1.73_m2} (ref >59)
GFR calc non Af Amer: 60 mL/min/{1.73_m2} (ref >59)
Globulin, Total: 2.8 g/dL (ref 1.5–4.5)
Glucose: 111 mg/dL — ABNORMAL HIGH (ref 65–99)
Potassium: 4.2 mmol/L (ref 3.5–5.2)
Sodium: 133 mmol/L — ABNORMAL LOW (ref 134–144)
Total Protein: 7.1 g/dL (ref 6.0–8.5)

## 2019-09-08 LAB — LIPID PANEL
Chol/HDL Ratio: 6.8 ratio — ABNORMAL HIGH (ref 0.0–4.4)
Cholesterol, Total: 245 mg/dL — ABNORMAL HIGH (ref 100–199)
HDL: 36 mg/dL — ABNORMAL LOW (ref >39)
LDL Chol Calc (NIH): 152 mg/dL — ABNORMAL HIGH (ref 0–99)
Triglycerides: 305 mg/dL — ABNORMAL HIGH (ref 0–149)
VLDL Cholesterol Cal: 57 mg/dL — ABNORMAL HIGH (ref 5–40)

## 2019-09-08 MED ORDER — SPIRONOLACTONE 25 MG PO TABS: 25.0000 mg | ORAL_TABLET | ORAL | 11 refills | Status: DC

## 2019-09-08 NOTE — Patient Instructions (Signed)
Medication Instructions:  Not needed *If you need a refill on your cardiac medications before your next appointment, please call your pharmacy*  Lab Work: Lipid  cmp  If you have labs (blood work) drawn today and your tests are completely normal, you will receive your results only by: Marland Kitchen MyChart Message (if you have MyChart) OR . A paper copy in the mail If you have any lab test that is abnormal or we need to change your treatment, we will call you to review the results.  Testing/Procedures: Not needed  Follow-Up: At [facility], you and your health needs are our priority.  As part of our continuing mission to provide you with exceptional heart care, we have created designated Provider Care Teams.  These Care Teams include your primary Cardiologist (physician) and Advanced Practice Providers (APPs -  Physician Assistants and Nurse Practitioners) who all work together to provide you with the care you need, when you need it.  Your next appointment:   2 to 3  month(s)  The format for your next appointment:   In Person  Provider:   Glenetta Hew, MD

## 2019-09-08 NOTE — Progress Notes (Signed)
Primary Care Provider: Kristopher Glee., MD Cardiologist: Glenetta Hew, MD Electrophysiologist:   Clinic Note: Chief Complaint  Patient presents with  . Follow-up    Doing relatively well.  . Coronary Artery Disease    Like to complain of having chest pain but has had negative ischemic evaluations  . PAD    Has renovascular and splanchnic arterial stenoses.-Followed by vascular surgery    HPI:    [name] is a 79 y.o. female with a PMH notable for CAD, PAD & HTN (RAS) who presents today for annual f/u  Latoria has a history of known CAD-PCI as well as chronic diastolic dysfunction and PAD /splanchnic artery disease.   CAD: pRCA & mLAD x 2 in 2011 (BMS stents used for preop back surgery),   Post-op back Sgx, had recurrent angina --> DES x 2 LAD for ISR  Myoview May 2018: Normal EF (hyperdynamic LV function EF greater than 65%).  LOW RISK.  No ischemia or infarction.  PAD: - RAS - s/p renal stent; celiac/SMA stenosis s/p SMA stent -> followed by vascular surgery  Also has very difficult to control lipids with statin intolerance and refusal to take other medications. - Has been evaluated by our [facility] team - but stopped going -> she prefers to avoid discussion and treatment of her lipids.  Not interested in new medications.  She also has chronic lower extremity edema probably related to some diastolic function also venous insufficiency.  Usually controlled with Lasix and metolazone.  ATHEL CHIERA was last seen in Feb 2020  Recent Hospitalizations: None  Reviewed  CV studies:    The following studies were reviewed today: (if available, images/films reviewed: From Epic Chart or Care Everywhere) . Renal A & Mesenteric Duplex Dopplers (03/2019): 159% right renal artery stenosis, no change.  Unable to visualize left renal artery ostium due to bowel gas.  Continued 70 to 90% celiac artery and superior mesenteric artery stenoses.  No change.   Interval History:    [name] returns here today for somewhat earlier than scheduled follow-up doing okay.  She says she always has her intermittent chest discomfort symptoms but they are no real different than she has had in the past.  May or may not have enough to take a nitroglycerin.  She says her swelling is pretty well controlled on the current dose of diuretic.  She has been having issues getting her spironolactone which we will will need to provide a prescription for for a different pharmacy.  The current pharmacy is unable to provide it. Her blood pressures actually have been doing fairly well at home, but they are little high today.  Over the last several months patient has been very active with doing baking as she normally does for the holidays.  She pretty much has been nonstop since Thanksgiving.  She does not do a lot of exercise, but is very active and does what she wants to do.  She is not necessarily limited by anything.   CV Review of Symptoms (Summary): positive for - chest pain, dyspnea on exertion, edema, irregular heartbeat, orthopnea and Off-and-on chest discomfort which may or may not be central chest angina related, can be musculoskeletal as well.  Does have exertional dyspnea if she tries to do too much.  Has edema but well controlled. negative for - paroxysmal nocturnal dyspnea, rapid heart rate, shortness of breath or Syncope/near syncope, TIA/emesis fugax.  Claudication.  The patient does not have symptoms  concerning for COVID-19 infection (fever, chills, cough, or new shortness of breath).  The patient is practicing social distancing. ++ Masking.  Rarely goes out for groceries/shopping.  Loves to do work in [employer].   REVIEWED OF SYSTEMS   A comprehensive ROS was performed. Review of Systems  Constitutional: Negative for weight loss (She is actually gained back a lot of the weight that she had lost over the summer).  HENT: Negative for congestion and nosebleeds.    Respiratory: Negative for cough, shortness of breath and wheezing.   Gastrointestinal: Positive for abdominal pain (Off and on). Negative for blood in stool and melena.  Genitourinary: Positive for flank pain. Negative for hematuria.  Musculoskeletal: Positive for joint pain. Negative for falls.  Neurological: Positive for dizziness (Sometimes positional). Negative for focal weakness.  Psychiatric/Behavioral: Negative for depression and memory loss. The patient is nervous/anxious. The patient does not have insomnia.   All other systems reviewed and are negative.   I have reviewed and (if needed) personally updated the patient's problem list, medications, allergies, past medical and surgical history, social and family history.   PAST MEDICAL HISTORY   Past Medical History:  Diagnosis Date  . Arthritis    "fingers, right shoulder" (09/09/2017)  . Atrial fibrillation ([place])   . Back pain with radiation    Chronic Back Pain - mutliple surgeries (including tumor removal)  . Bilateral edema of lower extremity    Chronic, likely related to venous stasis  . CAD S/P percutaneous coronary angioplasty    a) LHC: 07/30/10. -- 3.0x36mm Integrity BMS to pRCA & 2.5x 21mm Integrity BMS mLAD(@ D2).  b) Class III-IV Angina 03/2011: LHC- ISR in LAD BMS -- prox overlapping Promus DES 2.5x63mm and PTCA of jailed D2 ostium-prox 80%. c) 02/03/12:  LHC- patent stents.  Jailed diagonal. with stable flow; d) Peri-OP NSTEMI 04/2012 - LHC in 12/'13 -   . Celiac artery stenosis ([place])    12/2016 - STENT placement  . Complication of anesthesia    "used to wake up wild years ago" (09/09/2017)  . Congestive heart failure with LV diastolic dysfunction, NYHA class 2 ([place])    06/13/10:  last 2D echo-  EF >55%, Mild TR, Mod Conc LVH - Grade 1 diastolic dysfunction (abnormal relaxation) --> LVEDP on Cath 28 mmHg & mild 2nd Pulm HTN  . Diet-controlled type 2 diabetes mellitus ([place])   . Diverticulitis of colon (without mention of  hemorrhage)(562.11)   . Dyslipidemia, goal LDL below 70    Intolerant to statins  . GERD (gastroesophageal reflux disease)   . Hepatitis ~ 1957   "yellow jaundice" (01/14/2017)  . Hepatomegaly   . Hiatal hernia   . History of blood transfusion 04/2012   "when I had a heart attack"  . History of kidney stones    "I've got a stone embedded in one of my kidneys" (09/09/2017)  . History of stomach ulcers "years ago"  . Irritable bowel syndrome (IBS)   . Labile essential hypertension    Partially related to RAS  . Mesenteric artery stenosis (HCC)    95% Celiac Artery - ostial, 20-30% SMA.  Bilateral Renal A: L RA stent patent, R RA 20-30% -- Conservative Management  . NSTEMI (non-ST elevated myocardial infarction) ([place]) 04/2012   Unclear the details, apparently this was postoperative from her back surgery that she was cleared for my last saw her in June.  Reportedly had stents placed  . Pancreas divisum    On pancreatic enzyme  .  Renal artery stenosis ([place]) 2011; 12/'13   a) Angiogram 02/03/12:  50-60%L RA stenosis, 40% R R Inferior artery; b) 12/'13: S/P L RA Stent ([facility]) 6.0 mm x 15 mm; c) Renal Duplex 10/2013: <60% L RA, <60 R RA, ~60% SMA & Celiac A.  . Stricture and stenosis of esophagus   . Unspecified gastritis and gastroduodenitis without mention of hemorrhage      PAST SURGICAL HISTORY   Past Surgical History:  Procedure Laterality Date  . APPENDECTOMY    . BACK SURGERY    . BREAST DUCTAL SYSTEM EXCISION     right  . BREAST SURGERY Right   . CARDIAC CATHETERIZATION N/A 03/16/2015   Procedure: Left Heart Cath and Coronary Angiography;  Surgeon: Leonie Man, MD;  Location: [facility];  Service: Cardiovascular; Widely patent m-dLAD stents as wellas pRCA stent.  High LVEDP, small Diag & Om vessels with moderate stenosis   . Cardiac Event Monitor  01/2017   Mostly NSR with occasional sinus tachycardia and rare bradycardia. No A. fib. No PND or PSVT. Rare PACs and  PVCs.  Marland Kitchen CARDIOVASCULAR STRESS TEST     11/07/11:  Normal perfusion pattern.  EF 66% No wall motion abnormalities.   Marland Kitchen CATARACT EXTRACTION W/ INTRAOCULAR LENS  IMPLANT, BILATERAL Bilateral   . CHOLECYSTECTOMY OPEN    . COLONOSCOPY  03/01/2009   normal   . CORONARY ANGIOPLASTY WITH STENT PLACEMENT  07/2010, 02/2011   LHC: 07/30/10.  3.0x22mm Integrity BMS to RCA and 2.5x 75mm Integrity BMS LAD.  03/2011: LHC- ISRS in LAD treated with overlapping Promus DES 2.5x84mm and PTCA of Diagonal.  02/03/12:  LHC- patent stents.  Jailed diagonal.  . DOBUTAMINE STRESS ECHO  03/07/2015   [facility] (ordered for pre-op evaluation for EUS/ERCP --> abnormal EKG:  strreesss test shhoowwed 1 mm ST segment depressions downsloping. No wall motion abnormalities at peak exercise or at rest. Diastolic dysfunction was noted but normal systolic function - EF greater than 55%. No bouts of regurgitation or stenosis. Resting hypertension with exaggerated response  . ESOPHAGOGASTRODUODENOSCOPY  04/08/2012  . ESOPHAGOGASTRODUODENOSCOPY (EGD) WITH ESOPHAGEAL DILATION  X 2  . FRACTURE SURGERY    . HAMMER TOE SURGERY Bilateral    "took bone off the top of 2nd toe on each foot"  . HIP SURGERY Left    "something to do w/my back"  . JOINT REPLACEMENT    . KNEE SURGERY Left    "had fluid drained off it a couple times"  . LEFT HEART CATHETERIZATION WITH CORONARY ANGIOGRAM N/A 02/03/2012   Procedure: LEFT HEART CATHETERIZATION WITH CORONARY ANGIOGRAM;  Surgeon: Leonie Man, MD;  Location[facility] C[facility];  Service: Cardiovascular;  Laterality: N/A;  . LEFT HEART CATHETERIZATION WITH CORONARY ANGIOGRAM N/A 05/12/2014   Procedure: LEFT HEART CATHETERIZATION WITH CORONARY ANGIOGRAM;  Surgeon: Lorretta Harp, MD;  Location: [facility] CATH LAB;  Service: Cardiovascular: Stable CAD. Patent stents. Patent renal artery stent  . LEFT HEART CATHETERIZATION WITH CORONARY ANGIOGRAM  08/2012   Peri-Op MI @ [facility] -- 40% ostial D1, patent LAD stents, 10%  RCA ISR  . LEFT HEART CATHETERIZATION WITH CORONARY ANGIOGRAM   03/2012   ~30% ISR RCA, patent LAD stent & D2; EDP ~28 mmHg  . NM MYOVIEW LTD  12/2016   LOW RISK study. No ischemia or infarction. EF 65-75%.  Marland Kitchen PANCREAS SURGERY     "stent in my pancreas"; Dr Pershing Proud  . PERCUTANEOUS PINNING PHALANX  FRACTURE OF HAND Left ~ 2013  . PERIPHERAL VASCULAR BALLOON ANGIOPLASTY  09/09/2017   Procedure: PERIPHERAL VASCULAR BALLOON ANGIOPLASTY;  Surgeon: Serafina Mitchell, MD;  Location: [facility];  Service: Cardiovascular;;  Celiac instent  . PERIPHERAL VASCULAR INTERVENTION  01/14/2017   Procedure: Peripheral Vascular Intervention;  Surgeon: Serafina Mitchell, MD;  Location: [facility];  Service: Cardiovascular;;  mesentric  . RENAL ARTERY STENT Left 08/2012   @ [facility] - 6.0 mm x 15 mm  . SPINE SURGERY     tumor removed 07/2010; Redo Surgery 04/2012; Sacroiliac Sgx 08/2013  . TOTAL ABDOMINAL HYSTERECTOMY    . TOTAL KNEE ARTHROPLASTY Left ~ 2012  . VISCERAL ANGIOGRAM N/A 05/12/2014   Procedure: VISCERAL ANGIOGRAM;  Surgeon: Lorretta Harp, MD;  Location: [facility] CATH LAB;  Service: Cardiovascular;  Laterality: N/A;  . VISCERAL ANGIOGRAPHY N/A 01/14/2017   Procedure: Mesenteric  Angiography;  Surgeon: Serafina Mitchell, MD;  Location: [facility];  Service: Cardiovascular;  Laterality: N/A;  . VISCERAL ANGIOGRAPHY N/A 09/09/2017   Procedure: VISCERAL ANGIOGRAPHY;  Surgeon: Serafina Mitchell, MD;  Location: [facility];  Service: Cardiovascular;  Laterality: N/A;     MEDICATIONS/ALLERGIES   Current Meds  Medication Sig  . acetaminophen (TYLENOL) 500 MG tablet Take 2,000 mg by mouth 4 (four) times daily as needed for moderate pain.   . Artificial Tear Solution (TEARS RENEWED OP) Apply 1 drop to eye daily as needed (dry eyes).  . carvedilol (COREG) 6.25 MG tablet TAKE 1 TABLET(6.25 MG) BY MOUTH TWICE DAILY WITH A MEAL. MAY TAKE AN EXTRA TABLET DAILY IF HAVING  PALPATIONS  . clopidogrel (PLAVIX) 75 MG tablet TAKE 1 TABLET(75 MG) BY MOUTH DAILY  . furosemide (LASIX) 80 MG tablet TAKE 1 TABLET(80 MG) BY MOUTH TWICE DAILY  . indapamide (LOZOL) 2.5 MG tablet TAKE 1 TABLET BY MOUTH DAILY, 30 MINUTES BEFORE DAILY FUROSEMIDE  . lidocaine (XYLOCAINE) 5 % ointment Apply 1 application topically at bedtime as needed (toe pain).   Marland Kitchen lidocaine (XYLOCAINE) 5 % ointment Apply small amount to the bottom of the right foot 2-3 times per day  . Lidocaine 4 % PTCH Apply 1 patch topically daily as needed (pain).  Marland Kitchen losartan (COZAAR) 50 MG tablet TAKE 1 TABLET(50 MG) BY MOUTH DAILY  . nitroGLYCERIN (NITROSTAT) 0.4 MG SL tablet DISSOLVE 1 TABLET UNDER THE TONGUE EVERY 5 MINUTES AS NEEDED FOR CHEST PAIN  . spironolactone (ALDACTONE) 25 MG tablet Take 1 tablet (25 mg total) by mouth as directed. TAKE 1 TABLET ON MONDAYS, WEDNESDAYS, AND FRIDAYS at 6 PM.  . [DISCONTINUED] spironolactone (ALDACTONE) 25 MG tablet Take 1 tablet (25 mg total) by mouth as directed. TAKE 1 TABLET ON MONDAYS, WEDNESDAYS, AND FRIDAYS at 6 PM. *NEEDS OFFICE VISIT*    Allergies  Allergen Reactions  . Codeine Anaphylaxis  . Codeine Phosphate Anaphylaxis, Shortness Of Breath and Swelling  . Contrast Media [Iodinated Diagnostic Agents]     swelling  . Penicillin G Anaphylaxis  . Rosuvastatin Calcium Other (See Comments)    Intolerant  . Iodine Swelling and Rash    REACTION: unspecified This allergy is to Topical iodine only.   . Prednisone Itching and Rash    Keeps pt up all night   . Statins Other (See Comments)    Intolerant  Intolerant  . Tramadol Other (See Comments) and Palpitations    Kept patient awake Tramadol "makes me hyper".  SOCIAL HISTORY/FAMILY HISTORY   Social History   Tobacco Use  . Smoking status: Never Smoker  . Smokeless tobacco: Never Used  Substance Use Topics  . Alcohol use: No  . Drug use: No   Social History   Social History Narrative   Married  mother of one, one grandchild.   Very socially active, enjoys cooking and having get-togethers her house. She had been exercising regularly but her back pain limits her.   Does not smoke, does not drink alcohol.    Family History family history includes Cancer in her father; Colitis in her maternal grandfather; Diabetes in her brother; Heart attack in her brother, brother, brother, and father; Heart disease in her father; Stroke in her brother and mother.   OBJCTIVE -PE, EKG, labs   Wt Readings from Last 3 Encounters:  09/08/19 204 lb (92.5 kg)  03/22/19 167 lb (75.8 kg)  10/22/18 196 lb 3.2 oz (89 kg)    Physical Exam: BP (!) 168/72   Pulse 71   Temp 98 F (36.7 C)   Ht 5\' 8"  (1.727 m)   Wt 204 lb (92.5 kg)   SpO2 97%   BMI 31.02 kg/m  Physical Exam  Constitutional: She is oriented to person, place, and time. She appears well-developed and well-nourished. No distress.  Relatively healthy appearing.  She is now back to her previous weight.  She would like to lose   HENT:  Head: Normocephalic and atraumatic.  Neck: No hepatojugular reflux and no JVD present. Carotid bruit is not present.  Cardiovascular: Normal rate, regular rhythm, S1 normal, intact distal pulses and normal pulses.  Occasional extrasystoles are present. PMI is not displaced (Unable to assess). Exam reveals gallop, S4 and distant heart sounds. Exam reveals no friction rub.  Murmur heard. High-pitched harsh crescendo-decrescendo early systolic murmur is present with a grade of 1/6 at the upper right sternal border. Pulmonary/Chest: Effort normal and breath sounds normal. No respiratory distress. She has no wheezes. She has no rales.  Abdominal: Soft. Bowel sounds are normal. She exhibits no distension. There is no abdominal tenderness (Some epigastric tenderness). There is no rebound.  Musculoskeletal:        General: Edema (Trivial ankle edema) present. Normal range of motion.     Cervical back: Normal range of  motion and neck supple.  Neurological: She is alert and oriented to person, place, and time.  Psychiatric: She has a normal mood and affect. Her behavior is normal. Judgment and thought content normal.  Vitals reviewed.   Adult ECG Report  Rate: 71;  Rhythm: normal sinus rhythm and Nonspecific ST-T wave changes.  Cannot exclude inferior ischemia.  Otherwise normal axis, intervals, and durations.;   Narrative Interpretation: Stable EKG  Recent Labs: Has been checked by PCP, but not recently.  Checked today. Lab Results  Component Value Date   CHOL 245 (H) 09/08/2019   HDL 36 (L) 09/08/2019   LDLCALC 152 (H) 09/08/2019   TRIG 305 (H) 09/08/2019   CHOLHDL 6.8 (H) 09/08/2019   Lab Results  Component Value Date   CREATININE 0.92 09/08/2019   BUN 26 09/08/2019   NA 133 (L) 09/08/2019   K 4.2 09/08/2019   CL 91 (L) 09/08/2019   CO2 26 09/08/2019    ASSESSMENT/PLAN    Problem List Items Addressed This Visit    CAD S/P percutaneous coronary angioplasty: pRCA BMS, mLAD BMS overlapped prox with DES for ISR - Primary (Chronic)    Most recent PCI was  back in 2012 12.  She had follow-up heart cath in 2013 and 2016 that showed stable disease.  She has had negative Myoview in 2018 as well.  Please walk evaluate atypical chest pain type symptoms.   Plan: Continue with carvedilol and losartan.  Would like to try to figure out some way to treat her lipids, however she has been very reluctant.  Continue current dose Plavix which can be held for any procedures.  Would consider converting to low-dose twice daily Xarelto +81 aspirin given her PAD.      Relevant Medications   spironolactone (ALDACTONE) 25 MG tablet   Other Relevant Orders   EKG 12-Lead (Completed)   Lipid panel (Completed)   Comprehensive metabolic panel (Completed)   Congestive heart failure with LV diastolic dysfunction, NYHA class 2 (HCC) (Chronic)    She does have some orthopnea and baseline edema.  I think most of  the swelling however is related to venous stasis.  She probably does need a little better blood pressure control however she has had issues with orthostasis. Plan: Continue current dose carvedilol for now as she did not tolerate higher dose.  May need to titrate up losartan. Restart spironolactone and continue current dose of Lasix.  She has metolazone for as needed.      Relevant Medications   spironolactone (ALDACTONE) 25 MG tablet   Other Relevant Orders   EKG 12-Lead (Completed)   Lipid panel (Completed)   Comprehensive metabolic panel (Completed)   Dyslipidemia, goal LDL below 70 - WITH STATIN INTOLERANCE (Chronic)    She really has been against any potential treatment options.  Has been intolerant to many statins.  She initially was going to take Lovaza, but is no longer on it for side effect reasons.  The septal was too expensive.  We tried starting Zetia, but again was uninterested.  We will recheck lipids now and then discuss in follow-up.      Relevant Medications   spironolactone (ALDACTONE) 25 MG tablet   Other Relevant Orders   Lipid panel (Completed)   Comprehensive metabolic panel (Completed)   Exertional dyspnea (Chronic)    Probably more related to deconditioning and possibly some diastolic dysfunction.  Continue to recommend that she does actual exercise like walking.      Renal artery stenosis (HCC) (Chronic)    Now followed by nephrology.  She has had some flank pain and is concerned about possible kidney stones.  I think this is felt that she should discuss with her PCP.  She is also worried that they were not able to see her kidney artery stents.  I reassured her that the vascular surgeons are fully capable of handling this monitoring.  She wanted to check renal artery Dopplers which we can check if she would like after follow-up.      Relevant Medications   spironolactone (ALDACTONE) 25 MG tablet   Stasis edema of bilateral lower extremity (Chronic)    Mostly  venous stasis related.  She is on Lasix which controlled pretty well and      Essential hypertension (Chronic)    Blood pressure is high today.  Not as high on recheck.  Would like to see what it is again in quick follow-up.  She has been out of her spironolactone which could be part of the reason.  We will refill that for her.  May need to consider up titration of her losartan      Relevant Medications   spironolactone (ALDACTONE) 25  MG tablet   Other Relevant Orders   Comprehensive metabolic panel (Completed)   Palpitation (Chronic)    Pretty well controlled on current dose carvedilol, need to monitor.         COVID-19 Education: The signs and symptoms of COVID-19 were discussed with the patient and how to seek care for testing (follow up with PCP or arrange E-visit).   The importance of social distancing was discussed today.  I spent a total of 18 minutes with the patient and chart review. >  50% of the time was spent in direct patient consultation.  Additional time spent with chart review (studies, outside notes, etc): 6 Total Time: 24 min   Current medicines are reviewed at length with the patient today.  (+/- concerns) none   Patient Instructions / Medication Changes & Studies & Tests Ordered   Patient Instructions  Medication Instructions:  Not needed *If you need a refill on your cardiac medications before your next appointment, please call your pharmacy*  Lab Work: Lipid  cmp  If you have labs (blood work) drawn today and your tests are completely normal, you will receive your results only by: Marland Kitchen MyChart Message (if you have MyChart) OR . A paper copy in the mail If you have any lab test that is abnormal or we need to change your treatment, we will call you to review the results.  Testing/Procedures: Not needed  Follow-Up: At [facility], you and your health needs are our priority.  As part of our continuing mission to provide you with exceptional heart  care, we have created designated Provider Care Teams.  These Care Teams include your primary Cardiologist (physician) and Advanced Practice Providers (APPs -  Physician Assistants and Nurse Practitioners) who all work together to provide you with the care you need, when you need it.  Your next appointment:   2 to 3  month(s)  The format for your next appointment:   In Person  Provider:   Glenetta Hew, MD    Studies Ordered:   Orders Placed This Encounter  Procedures  . Lipid panel  . Comprehensive metabolic panel  . EKG 12-Lead     Glenetta Hew, M.D., M.S. Interventional Cardiologist   Pager # [phone] Phone # [phone] [address] [place], [place] [address]   Thank you for choosing Heartcare at [facility]!!

## 2019-09-11 ENCOUNTER — Encounter: Payer: Self-pay | Admitting: Cardiology

## 2019-09-11 NOTE — Assessment & Plan Note (Signed)
Pretty well controlled on current dose carvedilol, need to monitor.

## 2019-09-11 NOTE — Assessment & Plan Note (Signed)
She really has been against any potential treatment options.  Has been intolerant to many statins.  She initially was going to take Lovaza, but is no longer on it for side effect reasons.  The septal was too expensive.  We tried starting Zetia, but again was uninterested.  We will recheck lipids now and then discuss in follow-up.

## 2019-09-11 NOTE — Assessment & Plan Note (Signed)
Probably more related to deconditioning and possibly some diastolic dysfunction.  Continue to recommend that she does actual exercise like walking.

## 2019-09-11 NOTE — Assessment & Plan Note (Signed)
Mostly venous stasis related.  She is on Lasix which controlled pretty well and

## 2019-09-11 NOTE — Assessment & Plan Note (Signed)
Most recent PCI was back in 2012 12.  She had follow-up heart cath in 2013 and 2016 that showed stable disease.  She has had negative Myoview in 2018 as well.  Please walk evaluate atypical chest pain type symptoms.   Plan: Continue with carvedilol and losartan.  Would like to try to figure out some way to treat her lipids, however she has been very reluctant.  Continue current dose Plavix which can be held for any procedures.  Would consider converting to low-dose twice daily Xarelto +81 aspirin given her PAD.

## 2019-09-11 NOTE — Assessment & Plan Note (Signed)
Now followed by nephrology.  She has had some flank pain and is concerned about possible kidney stones.  I think this is felt that she should discuss with her PCP.  She is also worried that they were not able to see her kidney artery stents.  I reassured her that the vascular surgeons are fully capable of handling this monitoring.  She wanted to check renal artery Dopplers which we can check if she would like after follow-up.

## 2019-09-11 NOTE — Assessment & Plan Note (Signed)
Blood pressure is high today.  Not as high on recheck.  Would like to see what it is again in quick follow-up.  She has been out of her spironolactone which could be part of the reason.  We will refill that for her.  May need to consider up titration of her losartan

## 2019-09-11 NOTE — Assessment & Plan Note (Signed)
She does have some orthopnea and baseline edema.  I think most of the swelling however is related to venous stasis.  She probably does need a little better blood pressure control however she has had issues with orthostasis. Plan: Continue current dose carvedilol for now as she did not tolerate higher dose.  May need to titrate up losartan. Restart spironolactone and continue current dose of Lasix.  She has metolazone for as needed.

## 2019-10-12 ENCOUNTER — Other Ambulatory Visit: Payer: Self-pay | Admitting: Cardiology

## 2019-10-12 NOTE — Telephone Encounter (Signed)
Rx has been sent to the pharmacy electronically. ° °

## 2019-10-15 ENCOUNTER — Other Ambulatory Visit: Payer: Self-pay | Admitting: Cardiology

## 2019-11-11 ENCOUNTER — Other Ambulatory Visit: Payer: Self-pay | Admitting: Cardiology

## 2019-11-11 ENCOUNTER — Ambulatory Visit: Payer: Medicare Other | Admitting: Cardiology

## 2019-11-30 ENCOUNTER — Telehealth: Payer: Self-pay | Admitting: Cardiology

## 2019-11-30 MED ORDER — CLOPIDOGREL BISULFATE 75 MG PO TABS: ORAL_TABLET | ORAL | 9 refills | Status: DC

## 2019-11-30 NOTE — Telephone Encounter (Signed)
New Rx sent to [facility] 

## 2019-11-30 NOTE — Telephone Encounter (Signed)
New message   Patient needs a new prescription for clopidogrel (PLAVIX) 75 MG tablet sent to [id number] - [place], [place] - [id number] 90 day supply

## 2019-12-22 ENCOUNTER — Other Ambulatory Visit: Payer: Self-pay

## 2019-12-22 ENCOUNTER — Ambulatory Visit (INDEPENDENT_AMBULATORY_CARE_PROVIDER_SITE_OTHER): Payer: Medicare Other | Admitting: Cardiology

## 2019-12-22 VITALS — BP 141/68 | HR 78 | Temp 97.9°F | Resp 18 | Ht 68.0 in | Wt 205.8 lb

## 2019-12-22 DIAGNOSIS — E669 Obesity, unspecified: Secondary | ICD-10-CM

## 2019-12-22 DIAGNOSIS — E785 Hyperlipidemia, unspecified: Secondary | ICD-10-CM | POA: Diagnosis not present

## 2019-12-22 DIAGNOSIS — R0600 Dyspnea, unspecified: Secondary | ICD-10-CM | POA: Diagnosis not present

## 2019-12-22 DIAGNOSIS — I10 Essential (primary) hypertension: Secondary | ICD-10-CM | POA: Diagnosis not present

## 2019-12-22 DIAGNOSIS — I701 Atherosclerosis of renal artery: Secondary | ICD-10-CM

## 2019-12-22 DIAGNOSIS — Z9861 Coronary angioplasty status: Secondary | ICD-10-CM

## 2019-12-22 DIAGNOSIS — R079 Chest pain, unspecified: Secondary | ICD-10-CM

## 2019-12-22 DIAGNOSIS — I2510 Atherosclerotic heart disease of native coronary artery without angina pectoris: Secondary | ICD-10-CM | POA: Diagnosis not present

## 2019-12-22 DIAGNOSIS — R002 Palpitations: Secondary | ICD-10-CM | POA: Diagnosis not present

## 2019-12-22 DIAGNOSIS — I87302 Chronic venous hypertension (idiopathic) without complications of left lower extremity: Secondary | ICD-10-CM | POA: Diagnosis not present

## 2019-12-22 DIAGNOSIS — I25709 Atherosclerosis of coronary artery bypass graft(s), unspecified, with unspecified angina pectoris: Secondary | ICD-10-CM

## 2019-12-22 DIAGNOSIS — R0609 Other forms of dyspnea: Secondary | ICD-10-CM

## 2019-12-22 MED ORDER — NITROGLYCERIN 0.4 MG SL SUBL: SUBLINGUAL_TABLET | SUBLINGUAL | 4 refills | Status: DC

## 2019-12-22 MED ORDER — CARVEDILOL 6.25 MG PO TABS: ORAL_TABLET | ORAL | 1 refills | Status: DC

## 2019-12-22 MED ORDER — ISOSORBIDE MONONITRATE ER 30 MG PO TB24: 30.0000 mg | ORAL_TABLET | Freq: Every day | ORAL | 6 refills | Status: DC

## 2019-12-22 NOTE — Patient Instructions (Signed)
Medication Instructions:  START IMDUR 30 MG DAILY *If you need a refill on your cardiac medications before your next appointment, please call your pharmacy*  Testing/Procedures: Your physician has requested that you have a lexiscan myoview. A cardiac stress test is a cardiological test that measures the heart's ability to respond to external stress in a controlled clinical environment. The stress response is induced by intravenous pharmacological stimulation.   Follow-Up: Your next appointment:  [facility] with Glenetta Hew, MD  At [facility], you and your health needs are our priority.  As part of our continuing mission to provide you with exceptional heart care, we have created designated Provider Care Teams.  These Care Teams include your primary Cardiologist (physician) and Advanced Practice Providers (APPs -  Physician Assistants and Nurse Practitioners) who all work together to provide you with the care you need, when you need it.

## 2019-12-22 NOTE — Progress Notes (Signed)
Primary Care Provider: Kristopher Glee., MD Cardiologist: Glenetta Hew, MD Electrophysiologist: None  Clinic Note: Chief Complaint  Patient presents with  . Follow-up    Notes more frequent chest pain  . Coronary Artery Disease    HPI:    [name] is a 79 y.o. female with a PMH below who presents today for routine follow-up for CAD with chronic intermittent angina.  [name] has a history of known CAD-PCI as well as chronic diastolic dysfunction and PAD /splanchnic artery disease.   CAD: pRCA & mLAD x 2 in 2011 (BMS stents used for preop back surgery),  Post-op back Sgx, had recurrent angina -->DES x 2 LAD for ISR  Myoview May 2018: Normal EF (hyperdynamic LV function EF greater than 65%). LOW RISK. No ischemia or infarction.  PAD: - RAS - s/p renal stent; celiac/SMA stenosis s/p SMA stent -> followed by vascular surgery  Also has very difficult to control lipids with statin intolerance and refusal to take other medications. - Has been evaluated by our Cook team - but stopped going -> she prefers to avoid discussion and treatment of her lipids.  Not interested in new medications.  She also has chronic lower extremity edema probably related to some diastolic function also venous insufficiency.  Usually controlled with Lasix and metolazone.  [name] was last seen on 09/08/2019 for annual follow-up.  She was noting intermittent chest discomfort symptoms, but not that bad.  Not enough to take meds).  She said that her swelling was pretty well controlled on current dose of diuretic she was switched from metolazone to indapamide to take along with her furosemide and spironolactone.  She was being very active lots of baking and cooking as well as cleaning up during the holidays.  Not really getting a lot of to exercise, but a lot of physical activity.  At that time was not noticing significant limitation by chest discomfort.  She was noting some exertional dyspnea  and chest pain but not limiting.  Abdominal pain was off and on but nothing significant.  She tends to have symptoms in places where she has has stents (she says she has abdominal pain because of abdominal stent, she has flank pain because she has renal stents.  Not sure that this is not simply placing a plane on the vascular disease as opposed to the location.  Recent Hospitalizations: None  Reviewed  CV studies:    The following studies were reviewed today: (if available, images/films reviewed: From Epic Chart or Care Everywhere) . No new studies   Interval History:   [name]  returns here today again a little bit worn out because she spent multiple days almost weeks doing baking and cooking for the Easter holiday.  She baked well over 20 pies and 20 cakes as well as at least that many of those of bread.  She spends most of the day and open to late hours of the evening/morning working in the kitchen and then barely has no hours of sleep before she gets up and does it again all day long.  She has been noticing that she has been a little bit more short of breath than usual but not really having any more edema than usual.  She is to the point now where the shortness of breath is occasionally associated with chest pain with more frequency than before.  Enough that she mentions it before I asked. Her swelling is pretty stable as  long as she is able to get her prescription for the indapamide.  It does come and go but usually goes down when she puts her feet up at night and is able to get a good night sleep.  She does not have any orthopnea or PND symptoms nor does she have any angina decubitus symptoms. . CV Review of Symptoms (Summary) Cardiovascular ROS: positive for - chest pain, dyspnea on exertion, edema and shortness of breath negative for - irregular heartbeat, orthopnea, palpitations, paroxysmal nocturnal dyspnea, rapid heart rate or Syncope/near syncope, TIA/amaurosis fugax,  claudication  The patient does not have symptoms concerning for COVID-19 infection (fever, chills, cough, or new shortness of breath).  The patient is practicing social distancing & Masking.   She tells me that she does not plan on getting her COVID-19 vaccine.  Quite likely her husband will, but she refuses much like she refuses to do flu shots or pneumonia vaccines.  REVIEWED OF SYSTEMS   Review of Systems  Constitutional: Negative for malaise/fatigue (Mostly tired because she does not stop during the course of the day.) and weight loss (She wants to lose back some of the weight that she has gained.  She wants to be about halfway from where she had gotten down to.).  HENT: Negative for nosebleeds.   Respiratory: Negative for shortness of breath.   Cardiovascular: Positive for leg swelling (Stable).  Gastrointestinal: Negative for blood in stool.  Genitourinary: Negative for hematuria.  Musculoskeletal: Positive for joint pain. Negative for falls.  Neurological: Positive for dizziness (Only she does not drink enough). Negative for focal weakness, weakness and headaches.  Psychiatric/Behavioral: Negative for memory loss. The patient is nervous/anxious. The patient does not have insomnia (Not insomnia, just works till early hours in the morning.).    I have reviewed and (if needed) personally updated the patient's problem list, medications, allergies, past medical and surgical history, social and family history.   PAST MEDICAL HISTORY   Past Medical History:  Diagnosis Date  . Arthritis    "fingers, right shoulder" (09/09/2017)  . Atrial fibrillation ([place])   . Back pain with radiation    Chronic Back Pain - mutliple surgeries (including tumor removal)  . Bilateral edema of lower extremity    Chronic, likely related to venous stasis  . CAD S/P percutaneous coronary angioplasty    a) LHC: 07/30/10. -- 3.0x50mm Integrity BMS to pRCA & 2.5x 64mm Integrity BMS mLAD(@ D2).  b) Class III-IV  Angina 03/2011: LHC- ISR in LAD BMS -- prox overlapping Promus DES 2.5x90mm and PTCA of jailed D2 ostium-prox 80%. c) 02/03/12:  LHC- patent stents.  Jailed diagonal. with stable flow; d) Peri-OP NSTEMI 04/2012 - LHC in 12/'13 -   . Celiac artery stenosis ([place])    12/2016 - STENT placement  . Complication of anesthesia    "used to wake up wild years ago" (09/09/2017)  . Congestive heart failure with LV diastolic dysfunction, NYHA class 2 ([place]    06/13/10:  last 2D echo-  EF >55%, Mild TR, Mod Conc LVH - Grade 1 diastolic dysfunction (abnormal relaxation) --> LVEDP on Cath 28 mmHg & mild 2nd Pulm HTN  . Diet-controlled type 2 diabetes mellitus ([place])   . Diverticulitis of colon (without mention of hemorrhage)(562.11)   . Dyslipidemia, goal LDL below 70    Intolerant to statins  . GERD (gastroesophageal reflux disease)   . Hepatitis ~ 1957   "yellow jaundice" (01/14/2017)  . Hepatomegaly   . Hiatal hernia   .  History of blood transfusion 04/2012   "when I had a heart attack"  . History of kidney stones    "I've got a stone embedded in one of my kidneys" (09/09/2017)  . History of stomach ulcers "years ago"  . Irritable bowel syndrome (IBS)   . Labile essential hypertension    Partially related to RAS  . Mesenteric artery stenosis ([facility])    95% Celiac Artery - ostial, 20-30% SMA.  Bilateral Renal A: L RA stent patent, R RA 20-30% -- Conservative Management  . NSTEMI (non-ST elevated myocardial infarction) ([place]) 04/2012   Unclear the details, apparently this was postoperative from her back surgery that she was cleared for my last saw her in June.  Reportedly had stents placed  . Pancreas divisum    On pancreatic enzyme  . Renal artery stenosis ([place]) 2011; 12/'13   a) Angiogram 02/03/12:  50-60%L RA stenosis, 40% R R Inferior artery; b) 12/'13: S/P L RA Stent ([facility]) 6.0 mm x 15 mm; c) Renal Duplex 10/2013: <60% L RA, <60 R RA, ~60% SMA & Celiac A.  . Stricture and stenosis of esophagus   .  Unspecified gastritis and gastroduodenitis without mention of hemorrhage     PAST SURGICAL HISTORY   Past Surgical History:  Procedure Laterality Date  . APPENDECTOMY    . BACK SURGERY    . BREAST DUCTAL SYSTEM EXCISION     right  . BREAST SURGERY Right   . CARDIAC CATHETERIZATION N/A 03/16/2015   Procedure: Left Heart Cath and Coronary Angiography;  Surgeon: Leonie Man, MD;  Location: [facility];  Service: Cardiovascular; Widely patent m-dLAD stents as wellas pRCA stent.  High LVEDP, small Diag & Om vessels with moderate stenosis   . Cardiac Event Monitor  01/2017   Mostly NSR with occasional sinus tachycardia and rare bradycardia. No A. fib. No PND or PSVT. Rare PACs and PVCs.  Marland Kitchen CARDIOVASCULAR STRESS TEST     11/07/11:  Normal perfusion pattern.  EF 66% No wall motion abnormalities.   Marland Kitchen CATARACT EXTRACTION W/ INTRAOCULAR LENS  IMPLANT, BILATERAL Bilateral   . CHOLECYSTECTOMY OPEN    . COLONOSCOPY  03/01/2009   normal   . CORONARY ANGIOPLASTY WITH STENT PLACEMENT  07/2010, 02/2011   LHC: 07/30/10.  3.0x73mm Integrity BMS to RCA and 2.5x 74mm Integrity BMS LAD.  03/2011: LHC- ISRS in LAD treated with overlapping Promus DES 2.5x35mm and PTCA of Diagonal.  02/03/12:  LHC- patent stents.  Jailed diagonal.  . DOBUTAMINE STRESS ECHO  03/07/2015   [facility] (ordered for pre-op evaluation for EUS/ERCP --> abnormal EKG:  strreesss test shhoowwed 1 mm ST segment depressions downsloping. No wall motion abnormalities at peak exercise or at rest. Diastolic dysfunction was noted but normal systolic function - EF greater than 55%. No bouts of regurgitation or stenosis. Resting hypertension with exaggerated response  . ESOPHAGOGASTRODUODENOSCOPY  04/08/2012  . ESOPHAGOGASTRODUODENOSCOPY (EGD) WITH ESOPHAGEAL DILATION  X 2  . FRACTURE SURGERY    . HAMMER TOE SURGERY Bilateral    "took bone off the top of 2nd toe on each foot"  . HIP SURGERY Left    "something to do w/my back"  . JOINT REPLACEMENT      . KNEE SURGERY Left    "had fluid drained off it a couple times"  . LEFT HEART CATHETERIZATION WITH CORONARY ANGIOGRAM N/A 02/03/2012   Procedure: LEFT HEART CATHETERIZATION WITH CORONARY ANGIOGRAM;  Surgeon: Leonie Man, MD;  Location: [facility]  CATH LAB;  Service: Cardiovascular;  Laterality: N/A;  . LEFT HEART CATHETERIZATION WITH CORONARY ANGIOGRAM N/A 05/12/2014   Procedure: LEFT HEART CATHETERIZATION WITH CORONARY ANGIOGRAM;  Surgeon: Lorretta Harp, MD;  Location: [facility] CATH LAB;  Service: Cardiovascular: Stable CAD. Patent stents. Patent renal artery stent  . LEFT HEART CATHETERIZATION WITH CORONARY ANGIOGRAM  08/2012   Peri-Op MI @ [facility] -- 40% ostial D1, patent LAD stents, 10% RCA ISR  . LEFT HEART CATHETERIZATION WITH CORONARY ANGIOGRAM   03/2012   ~30% ISR RCA, patent LAD stent & D2; EDP ~28 mmHg  . NM MYOVIEW LTD  12/2016   LOW RISK study. No ischemia or infarction. EF 65-75%.  Marland Kitchen PANCREAS SURGERY     "stent in my pancreas"; Dr Pershing Proud  . PERCUTANEOUS PINNING PHALANX FRACTURE OF HAND Left ~ 2013  . PERIPHERAL VASCULAR BALLOON ANGIOPLASTY  09/09/2017   Procedure: PERIPHERAL VASCULAR BALLOON ANGIOPLASTY;  Surgeon: Serafina Mitchell, MD;  Location: [facility];  Service: Cardiovascular;;  Celiac instent  . PERIPHERAL VASCULAR INTERVENTION  01/14/2017   Procedure: Peripheral Vascular Intervention;  Surgeon: Serafina Mitchell, MD;  Location: [place] [facility];  Service: Cardiovascular;;  mesentric  . RENAL ARTERY STENT Left 08/2012   @ [facility] - 6.0 mm x 15 mm  . SPINE SURGERY     tumor removed 07/2010; Redo Surgery 04/2012; Sacroiliac Sgx 08/2013  . TOTAL ABDOMINAL HYSTERECTOMY    . TOTAL KNEE ARTHROPLASTY Left ~ 2012  . VISCERAL ANGIOGRAM N/A 05/12/2014   Procedure: VISCERAL ANGIOGRAM;  Surgeon: Lorretta Harp, MD;  Location: [facility] CATH LAB;  Service: Cardiovascular;  Laterality: N/A;  . VISCERAL ANGIOGRAPHY N/A 01/14/2017   Procedure: Mesenteric   Angiography;  Surgeon: Serafina Mitchell, MD;  Location: [facility];  Service: Cardiovascular;  Laterality: N/A;  . VISCERAL ANGIOGRAPHY N/A 09/09/2017   Procedure: VISCERAL ANGIOGRAPHY;  Surgeon: Serafina Mitchell, MD;  Location: [place] [facility];  Service: Cardiovascular;  Laterality: N/A;    MEDICATIONS/ALLERGIES   Current Meds  Medication Sig  . acetaminophen (TYLENOL) 500 MG tablet Take 2,000 mg by mouth 4 (four) times daily as needed for moderate pain.   . Artificial Tear Solution (TEARS RENEWED OP) Apply 1 drop to eye daily as needed (dry eyes).  . clopidogrel (PLAVIX) 75 MG tablet TAKE 1 TABLET(75 MG) BY MOUTH DAILY  . diclofenac sodium (VOLTAREN) 1 % GEL Apply small amount sparingly to the right and left footl  . furosemide (LASIX) 80 MG tablet TAKE 1 TABLET(80 MG) BY MOUTH TWICE DAILY  . indapamide (LOZOL) 2.5 MG tablet TAKE ONE TABLET BY MOUTH DAILY, 30 MINUTES BEFORE DAILY FUROSEMIDE.  Marland Kitchen lidocaine (XYLOCAINE) 5 % ointment Apply 1 application topically at bedtime as needed (toe pain).   Marland Kitchen lidocaine (XYLOCAINE) 5 % ointment Apply small amount to the bottom of the right foot 2-3 times per day  . Lidocaine 4 % PTCH Apply 1 patch topically daily as needed (pain).  Marland Kitchen losartan (COZAAR) 50 MG tablet TAKE 1 TABLET(50 MG) BY MOUTH DAILY  . spironolactone (ALDACTONE) 25 MG tablet Take 1 tablet (25 mg total) by mouth as directed. TAKE 1 TABLET ON MONDAYS, WEDNESDAYS, AND FRIDAYS at 6 PM.  . [DISCONTINUED] carvedilol (COREG) 6.25 MG tablet TAKE 1 TABLET(6.25 MG) BY MOUTH TWICE DAILY WITH A MEAL. MAY TAKE AN EXTRA TABLET DAILY IF HAVING PALPATIONS  . [DISCONTINUED] nitroGLYCERIN (NITROSTAT) 0.4 MG SL tablet DISSOLVE 1 TABLET  UNDER THE TONGUE EVERY 5 MINUTES AS NEEDED FOR CHEST PAIN  . [DISCONTINUED] potassium chloride (MICRO-K) 10 MEQ CR capsule Take 2 capsules (20 mEq total) by mouth 2 (two) times daily.    Allergies  Allergen Reactions  . Codeine Anaphylaxis  . Codeine Phosphate  Anaphylaxis, Shortness Of Breath and Swelling  . Contrast Media [Iodinated Diagnostic Agents]     swelling  . Penicillin G Anaphylaxis  . Rosuvastatin Calcium Other (See Comments)    Intolerant  . Iodine Swelling and Rash    REACTION: unspecified This allergy is to Topical iodine only.   . Prednisone Itching and Rash    Keeps pt up all night   . Statins Other (See Comments)    Intolerant  Intolerant  . Tramadol Other (See Comments) and Palpitations    Kept patient awake Tramadol "makes me hyper".    SOCIAL HISTORY/FAMILY HISTORY   Reviewed in Epic:  Pertinent findings: No changes.  Continues to be busy doing housework and baking/cooking.  She says that she never has time to stop.  OBJCTIVE -PE, EKG, labs   Wt Readings from Last 3 Encounters:  12/22/19 205 lb 12.8 oz (93.4 kg)  09/08/19 204 lb (92.5 kg)  03/22/19 167 lb (75.8 kg)    Physical Exam: BP (!) 141/68   Pulse 78   Temp 97.9 F (36.6 C)   Resp 18   Ht 5\' 8"  (1.727 m)   Wt 205 lb 12.8 oz (93.4 kg)   SpO2 96%   BMI 31.29 kg/m  Physical Exam  Constitutional: She is oriented to person, place, and time. She appears well-developed and well-nourished. No distress.  Mildly obese but well-groomed.  Actually healthy-appearing.  HENT:  Head: Normocephalic and atraumatic.  Neck: No hepatojugular reflux and no JVD present. Carotid bruit is not present.  Cardiovascular: Normal rate, regular rhythm, S1 normal and S2 normal.  No extrasystoles are present. PMI is not displaced (Unable to palpate). Exam reveals gallop, S4 and distant heart sounds.  Murmur heard.  Medium-pitched harsh crescendo-decrescendo early systolic murmur is present with a grade of 1/6 at the upper right sternal border radiating to the neck. Pulmonary/Chest: Effort normal and breath sounds normal. No respiratory distress. She has no wheezes. She has no rales.  Abdominal: Soft. Bowel sounds are normal. She exhibits no distension. There is no abdominal  tenderness. There is no rebound.  Musculoskeletal:        General: Edema (Trace to 1+ puffy bilateral edema.) present. Normal range of motion.     Cervical back: Normal range of motion and neck supple.  Neurological: She is alert and oriented to person, place, and time.  Psychiatric: She has a normal mood and affect. Her behavior is normal. Judgment and thought content normal.  She is pretty much her usual jovial self which is not the case most the time when she is having chest pain.  Vitals reviewed.    Adult ECG Report None  Recent Labs: Pretty much refuses to take medications for cholesterol. Lab Results  Component Value Date   CHOL 245 (H) 09/08/2019   HDL 36 (L) 09/08/2019   LDLCALC 152 (H) 09/08/2019   TRIG 305 (H) 09/08/2019   CHOLHDL 6.8 (H) 09/08/2019   Lab Results  Component Value Date   CREATININE 0.92 09/08/2019   BUN 26 09/08/2019   NA 133 (L) 09/08/2019   K 4.2 09/08/2019   CL 91 (L) 09/08/2019   CO2 26 09/08/2019   Lab Results  Component  Value Date   TSH 2.40 10/24/2014    ASSESSMENT/PLAN    Problem List Items Addressed This Visit    CAD S/P percutaneous coronary angioplasty: pRCA BMS, mLAD BMS overlapped prox with DES for ISR (Chronic)    She has not had any PCI since back in 2012 but does have peripheral stents.  She seems to be tolerating clopidogrel better than aspirin as far as GI tolerance and will maintain treatment with Plavix for secondary prevention.  It is okay to hold Plavix 5-7 days preop for any surgeries or procedures.       Relevant Medications   carvedilol (COREG) 6.25 MG tablet   nitroGLYCERIN (NITROSTAT) 0.4 MG SL tablet   isosorbide mononitrate (IMDUR) 30 MG 24 hr tablet   Other Relevant Orders   MYOCARDIAL PERFUSION IMAGING   Dyslipidemia, goal LDL below 70 - WITH STATIN INTOLERANCE (Chronic)    We potentially have 1 more chance once inclisiran becomes available to discuss this with her.  I will wait till the results of the  Myoview and her to return to discuss the potential of that option.  If she is at all interested, we can then reassess lipids and have her talk with our clinical pharmacist team in the [facility]      Relevant Medications   carvedilol (COREG) 6.25 MG tablet   nitroGLYCERIN (NITROSTAT) 0.4 MG SL tablet   isosorbide mononitrate (IMDUR) 30 MG 24 hr tablet   Other Relevant Orders   MYOCARDIAL PERFUSION IMAGING   Exertional dyspnea (Chronic)    She does have chronic diastolic dysfunction in her dyspnea could barely related to that.  She is on diuretic and doing relatively well but may need additional afterload reduction.  Want to exclude progression of her CAD.    We will check a Myoview stress test.      Relevant Orders   MYOCARDIAL PERFUSION IMAGING   Chest pain with moderate risk for cardiac etiology - Primary (Chronic)    [name] is somewhat concerning because she tends to always have a complaint of chest pain.  The differences when she says it in a certain way, it seems to be more related to her psychosomatic type symptoms, however when it needs to be pulled out of her and she then acknowledges it, it seems to be a more concerning symptom.  This current spell seems to be a little bit more worrisome from a potential angina standpoint.  We can refill her nitroglycerin, and check a Myoview stress test just to exclude ischemia.      Obesity (BMI 30.0-34.9) (Chronic)    She is well aware of the need to lose back her weight.  She is little frustrated with herself.  We talked about trying to cut back on how much she eats which is very difficult because how much she enjoys cooking and food..      Stasis edema of bilateral lower extremity (Chronic)    She probably has a combination of some diastolic dysfunction but most of edema is related to venous stasis.  She wears support stockings when she can, and is doing a good job with a diuretic.  This is stable.  No change.      Essential  hypertension (Chronic)    Blood pressure is little bit high today.  But is usually better than this at home.  She has not taken her medications today for some reason.  I would like to see what her pressures look like when she  comes for her Myoview.  We do have room to potentially titrate up her carvedilol dose or losartan depending on what the pressures.      Relevant Medications   carvedilol (COREG) 6.25 MG tablet   nitroGLYCERIN (NITROSTAT) 0.4 MG SL tablet   isosorbide mononitrate (IMDUR) 30 MG 24 hr tablet   Other Relevant Orders   MYOCARDIAL PERFUSION IMAGING   Palpitation (Chronic)    Seems to be not cause much a problem now that she is on her stable dose of beta-blocker.  We may have room to titrate further.      Coronary artery disease involving coronary bypass graft of native heart with angina pectoris ([place]) (Chronic)    She had a cath done in 2013 as well as 2016 that showed pretty much patent stents.  She has had follow-up stress test done in 2018.  She does have intermittent symptoms which sound like they were could very well be angina.  She is on a good dose of carvedilol, and had previously been on Imdur but I do not see that it on again.  With her now having recurrent symptoms that I did not ask for, and she is in a good mood meaning she is not depressed or in a down mood where she complains about things, I am a little bit more concerned that the symptoms may be a true anginal symptoms.  Plan:   Recheck Lexiscan Myoview  Add Imdur 30 mg daily.  I am reluctant to use amlodipine because of her edema and concerns about that.  I am also leery of adding too many medications at 1 time.      Relevant Medications   carvedilol (COREG) 6.25 MG tablet   nitroGLYCERIN (NITROSTAT) 0.4 MG SL tablet   isosorbide mononitrate (IMDUR) 30 MG 24 hr tablet   Other Relevant Orders   MYOCARDIAL PERFUSION IMAGING       COVID-19 Education: The signs and symptoms of COVID-19 were  discussed with the patient and how to seek care for testing (follow up with PCP or arrange E-visit).   The importance of social distancing and COVID-19 vaccination was discussed today.  I spent a total of 40minutes with the patient. >  50% of the time was spent in direct patient consultation.  Additional time spent with chart review  / charting (studies, outside notes, etc): 8 Total Time: 32 min   Current medicines are reviewed at length with the patient today.  (+/- concerns) none  Notice: This dictation was prepared with Dragon dictation along with smaller phrase technology. Any transcriptional errors that result from this process are unintentional and may not be corrected upon review.  Patient Instructions / Medication Changes & Studies & Tests Ordered   Patient Instructions  Medication Instructions:  START IMDUR 30 MG DAILY *If you need a refill on your cardiac medications before your next appointment, please call your pharmacy*  Testing/Procedures: Your physician has requested that you have a lexiscan myoview. A cardiac stress test is a cardiological test that measures the heart's ability to respond to external stress in a controlled clinical environment. The stress response is induced by intravenous pharmacological stimulation.   Follow-Up: Your next appointment:  AFTER LEXISCAN with Glenetta Hew, MD  At [facility], you and your health needs are our priority.  As part of our continuing mission to provide you with exceptional heart care, we have created designated Provider Care Teams.  These Care Teams include your primary Cardiologist (physician) and Advanced Practice  Providers (APPs -  Physician Assistants and Nurse Practitioners) who all work together to provide you with the care you need, when you need it.      Studies Ordered:   Orders Placed This Encounter  Procedures  . MYOCARDIAL PERFUSION IMAGING     Glenetta Hew, M.D., M.S. Interventional Cardiologist    Pager # [phone] Phone # [phone] [address] [place], [place] [address]   Thank you for choosing Heartcare at [facility]!!

## 2019-12-23 ENCOUNTER — Encounter: Payer: Self-pay | Admitting: Cardiology

## 2019-12-23 NOTE — Assessment & Plan Note (Signed)
She is well aware of the need to lose back her weight.  She is little frustrated with herself.  We talked about trying to cut back on how much she eats which is very difficult because how much she enjoys cooking and food.Marland Kitchen

## 2019-12-23 NOTE — Assessment & Plan Note (Signed)
Blood pressure is little bit high today.  But is usually better than this at home.  She has not taken her medications today for some reason.  I would like to see what her pressures look like when she comes for her Myoview.  We do have room to potentially titrate up her carvedilol dose or losartan depending on what the pressures.

## 2019-12-23 NOTE — Assessment & Plan Note (Addendum)
She has not had any PCI since back in 2012 but does have peripheral stents.  She seems to be tolerating clopidogrel better than aspirin as far as GI tolerance and will maintain treatment with Plavix for secondary prevention.  It is okay to hold Plavix 5-7 days preop for any surgeries or procedures.

## 2019-12-23 NOTE — Assessment & Plan Note (Signed)
[name] is somewhat concerning because she tends to always have a complaint of chest pain.  The differences when she says it in a certain way, it seems to be more related to her psychosomatic type symptoms, however when it needs to be pulled out of her and she then acknowledges it, it seems to be a more concerning symptom.  This current spell seems to be a little bit more worrisome from a potential angina standpoint.  We can refill her nitroglycerin, and check a Myoview stress test just to exclude ischemia.

## 2019-12-23 NOTE — Assessment & Plan Note (Signed)
She had a cath done in 2013 as well as 2016 that showed pretty much patent stents.  She has had follow-up stress test done in 2018.  She does have intermittent symptoms which sound like they were could very well be angina.  She is on a good dose of carvedilol, and had previously been on Imdur but I do not see that it on again.  With her now having recurrent symptoms that I did not ask for, and she is in a good mood meaning she is not depressed or in a down mood where she complains about things, I am a little bit more concerned that the symptoms may be a true anginal symptoms.  Plan:   Recheck Lexiscan Myoview  Add Imdur 30 mg daily.  I am reluctant to use amlodipine because of her edema and concerns about that.  I am also leery of adding too many medications at 1 time.

## 2019-12-23 NOTE — Assessment & Plan Note (Signed)
She does have chronic diastolic dysfunction in her dyspnea could barely related to that.  She is on diuretic and doing relatively well but may need additional afterload reduction.  Want to exclude progression of her CAD.    We will check a Myoview stress test.

## 2019-12-23 NOTE — Assessment & Plan Note (Signed)
She probably has a combination of some diastolic dysfunction but most of edema is related to venous stasis.  She wears support stockings when she can, and is doing a good job with a diuretic.  This is stable.  No change.

## 2019-12-23 NOTE — Assessment & Plan Note (Signed)
Seems to be not cause much a problem now that she is on her stable dose of beta-blocker.  We may have room to titrate further.

## 2019-12-23 NOTE — Assessment & Plan Note (Signed)
We potentially have 1 more chance once inclisiran becomes available to discuss this with her.  I will wait till the results of the Myoview and her to return to discuss the potential of that option.  If she is at all interested, we can then reassess lipids and have her talk with our clinical pharmacist team in the [facility]

## 2019-12-27 ENCOUNTER — Telehealth: Payer: Self-pay | Admitting: Cardiology

## 2019-12-27 DIAGNOSIS — M25511 Pain in right shoulder: Secondary | ICD-10-CM | POA: Diagnosis not present

## 2019-12-27 NOTE — Telephone Encounter (Signed)
Patient called stating she would like to speak to [facility] Dr. Allison Quarry nurse. She stated she will be back home after 2pm today.

## 2019-12-27 NOTE — Telephone Encounter (Signed)
Called  No answer , left message  Will call  Back

## 2019-12-27 NOTE — Telephone Encounter (Signed)
Returned the call to the patient. She stated that she wanted to speak with [name] on a [employer]. Message has been routed.

## 2019-12-28 NOTE — Telephone Encounter (Signed)
Follow Up  Patient is returning call to [facility]. Informed patient that [name] is not in today. Patient is then requesting to speak with Dr. Ellyn Hack. Please give patient a call back to assist states that it is very important and from [employer].

## 2019-12-28 NOTE — Telephone Encounter (Signed)
Follow up  Pt is calling back, she said she is still waiting to speak with Dr. Ellyn Hack and hopefully he will call her today

## 2019-12-28 NOTE — Telephone Encounter (Signed)
Message has been routed to MD

## 2019-12-29 NOTE — Telephone Encounter (Signed)
Spoke to patient. information received and taken care of.

## 2019-12-29 NOTE — Telephone Encounter (Signed)
Patient called again this morning, checking on status of yesterday's call.

## 2019-12-29 NOTE — Telephone Encounter (Signed)
Follow up   Patient is calling back for [facility]

## 2019-12-31 ENCOUNTER — Telehealth (HOSPITAL_COMMUNITY): Payer: Self-pay

## 2019-12-31 NOTE — Telephone Encounter (Signed)
Encounter complete. 

## 2020-01-04 ENCOUNTER — Telehealth (HOSPITAL_COMMUNITY): Payer: Self-pay

## 2020-01-04 NOTE — Telephone Encounter (Signed)
Encounter complete. 

## 2020-01-05 ENCOUNTER — Ambulatory Visit (HOSPITAL_COMMUNITY)
Admission: RE | Admit: 2020-01-05 | Discharge: 2020-01-05 | Disposition: A | Discharge status: Home / Self Care | Payer: Medicare Other | Source: Ambulatory Visit | Attending: Cardiovascular Disease | Admitting: Cardiovascular Disease

## 2020-01-05 ENCOUNTER — Other Ambulatory Visit: Payer: Self-pay

## 2020-01-05 DIAGNOSIS — I10 Essential (primary) hypertension: Secondary | ICD-10-CM | POA: Insufficient documentation

## 2020-01-05 DIAGNOSIS — I2510 Atherosclerotic heart disease of native coronary artery without angina pectoris: Secondary | ICD-10-CM | POA: Diagnosis not present

## 2020-01-05 DIAGNOSIS — Z9861 Coronary angioplasty status: Secondary | ICD-10-CM | POA: Diagnosis not present

## 2020-01-05 DIAGNOSIS — R0600 Dyspnea, unspecified: Secondary | ICD-10-CM | POA: Diagnosis not present

## 2020-01-05 DIAGNOSIS — I25709 Atherosclerosis of coronary artery bypass graft(s), unspecified, with unspecified angina pectoris: Secondary | ICD-10-CM | POA: Insufficient documentation

## 2020-01-05 DIAGNOSIS — E785 Hyperlipidemia, unspecified: Secondary | ICD-10-CM | POA: Insufficient documentation

## 2020-01-05 DIAGNOSIS — R0609 Other forms of dyspnea: Secondary | ICD-10-CM

## 2020-01-05 HISTORY — PX: NM MYOVIEW LTD: HXRAD82

## 2020-01-05 LAB — MYOCARDIAL PERFUSION IMAGING
LV dias vol: 112 mL (ref 46–106)
LV sys vol: 42 mL
Peak HR: 88 {beats}/min
Rest HR: 66 {beats}/min
SDS: 2
SRS: 3
SSS: 5
TID: 1.05

## 2020-01-05 MED ORDER — REGADENOSON 0.4 MG/5ML IV SOLN
0.4000 mg | Freq: Once | INTRAVENOUS | Status: AC
  Administered 2020-01-05: 0.4 mg via INTRAVENOUS

## 2020-01-05 MED ORDER — TECHNETIUM TC 99M TETROFOSMIN IV KIT
10.9000 | PACK | Freq: Once | INTRAVENOUS | Status: AC | PRN
  Administered 2020-01-05: 10.9 via INTRAVENOUS
  Filled 2020-01-05: qty 11

## 2020-01-05 MED ORDER — AMINOPHYLLINE 25 MG/ML IV SOLN
75.0000 mg | Freq: Once | INTRAVENOUS | Status: AC
  Administered 2020-01-05: 75 mg via INTRAVENOUS

## 2020-01-05 MED ORDER — TECHNETIUM TC 99M TETROFOSMIN IV KIT
30.6000 | PACK | Freq: Once | INTRAVENOUS | Status: AC | PRN
  Administered 2020-01-05: 30.6 via INTRAVENOUS
  Filled 2020-01-05: qty 31

## 2020-02-24 DIAGNOSIS — I10 Essential (primary) hypertension: Secondary | ICD-10-CM | POA: Diagnosis not present

## 2020-02-24 DIAGNOSIS — I739 Peripheral vascular disease, unspecified: Secondary | ICD-10-CM | POA: Diagnosis not present

## 2020-02-24 DIAGNOSIS — E1142 Type 2 diabetes mellitus with diabetic polyneuropathy: Secondary | ICD-10-CM | POA: Diagnosis not present

## 2020-02-24 DIAGNOSIS — E785 Hyperlipidemia, unspecified: Secondary | ICD-10-CM | POA: Diagnosis not present

## 2020-02-24 DIAGNOSIS — R1012 Left upper quadrant pain: Secondary | ICD-10-CM | POA: Diagnosis not present

## 2020-02-24 DIAGNOSIS — E669 Obesity, unspecified: Secondary | ICD-10-CM | POA: Diagnosis not present

## 2020-03-01 DIAGNOSIS — M2011 Hallux valgus (acquired), right foot: Secondary | ICD-10-CM | POA: Diagnosis not present

## 2020-03-01 DIAGNOSIS — M2042 Other hammer toe(s) (acquired), left foot: Secondary | ICD-10-CM | POA: Diagnosis not present

## 2020-03-01 DIAGNOSIS — B351 Tinea unguium: Secondary | ICD-10-CM | POA: Diagnosis not present

## 2020-03-01 DIAGNOSIS — M2012 Hallux valgus (acquired), left foot: Secondary | ICD-10-CM | POA: Diagnosis not present

## 2020-03-01 DIAGNOSIS — M2041 Other hammer toe(s) (acquired), right foot: Secondary | ICD-10-CM | POA: Diagnosis not present

## 2020-03-08 DIAGNOSIS — Q600 Renal agenesis, unilateral: Secondary | ICD-10-CM | POA: Diagnosis not present

## 2020-03-08 DIAGNOSIS — I10 Essential (primary) hypertension: Secondary | ICD-10-CM | POA: Diagnosis not present

## 2020-03-08 DIAGNOSIS — R1012 Left upper quadrant pain: Secondary | ICD-10-CM | POA: Diagnosis not present

## 2020-03-08 DIAGNOSIS — E1142 Type 2 diabetes mellitus with diabetic polyneuropathy: Secondary | ICD-10-CM | POA: Diagnosis not present

## 2020-03-08 DIAGNOSIS — D649 Anemia, unspecified: Secondary | ICD-10-CM | POA: Diagnosis not present

## 2020-03-08 DIAGNOSIS — Z9049 Acquired absence of other specified parts of digestive tract: Secondary | ICD-10-CM | POA: Diagnosis not present

## 2020-03-08 DIAGNOSIS — E785 Hyperlipidemia, unspecified: Secondary | ICD-10-CM | POA: Diagnosis not present

## 2020-03-08 DIAGNOSIS — N200 Calculus of kidney: Secondary | ICD-10-CM | POA: Diagnosis not present

## 2020-03-08 DIAGNOSIS — K760 Fatty (change of) liver, not elsewhere classified: Secondary | ICD-10-CM | POA: Diagnosis not present

## 2020-03-29 ENCOUNTER — Other Ambulatory Visit: Payer: Self-pay | Admitting: Cardiology

## 2020-04-14 DIAGNOSIS — R197 Diarrhea, unspecified: Secondary | ICD-10-CM | POA: Diagnosis not present

## 2020-04-14 DIAGNOSIS — U071 COVID-19: Secondary | ICD-10-CM | POA: Diagnosis not present

## 2020-04-14 DIAGNOSIS — R112 Nausea with vomiting, unspecified: Secondary | ICD-10-CM | POA: Diagnosis not present

## 2020-04-14 DIAGNOSIS — N179 Acute kidney failure, unspecified: Secondary | ICD-10-CM | POA: Diagnosis not present

## 2020-04-14 DIAGNOSIS — R05 Cough: Secondary | ICD-10-CM | POA: Diagnosis not present

## 2020-04-21 ENCOUNTER — Other Ambulatory Visit: Payer: Self-pay | Admitting: Cardiology

## 2020-04-27 DIAGNOSIS — U071 COVID-19: Secondary | ICD-10-CM | POA: Diagnosis not present

## 2020-04-27 DIAGNOSIS — R110 Nausea: Secondary | ICD-10-CM | POA: Diagnosis not present

## 2020-04-27 DIAGNOSIS — R197 Diarrhea, unspecified: Secondary | ICD-10-CM | POA: Diagnosis not present

## 2020-05-08 DIAGNOSIS — R630 Anorexia: Secondary | ICD-10-CM | POA: Diagnosis not present

## 2020-05-08 DIAGNOSIS — Y999 Unspecified external cause status: Secondary | ICD-10-CM | POA: Diagnosis not present

## 2020-05-08 DIAGNOSIS — N200 Calculus of kidney: Secondary | ICD-10-CM | POA: Diagnosis not present

## 2020-05-08 DIAGNOSIS — E860 Dehydration: Secondary | ICD-10-CM | POA: Diagnosis not present

## 2020-05-08 DIAGNOSIS — K580 Irritable bowel syndrome with diarrhea: Secondary | ICD-10-CM | POA: Diagnosis not present

## 2020-05-08 DIAGNOSIS — I5030 Unspecified diastolic (congestive) heart failure: Secondary | ICD-10-CM | POA: Diagnosis not present

## 2020-05-08 DIAGNOSIS — I10 Essential (primary) hypertension: Secondary | ICD-10-CM | POA: Diagnosis not present

## 2020-05-08 DIAGNOSIS — I130 Hypertensive heart and chronic kidney disease with heart failure and stage 1 through stage 4 chronic kidney disease, or unspecified chronic kidney disease: Secondary | ICD-10-CM | POA: Diagnosis not present

## 2020-05-08 DIAGNOSIS — I951 Orthostatic hypotension: Secondary | ICD-10-CM | POA: Diagnosis not present

## 2020-05-08 DIAGNOSIS — I252 Old myocardial infarction: Secondary | ICD-10-CM | POA: Diagnosis not present

## 2020-05-08 DIAGNOSIS — Z955 Presence of coronary angioplasty implant and graft: Secondary | ICD-10-CM | POA: Diagnosis not present

## 2020-05-08 DIAGNOSIS — Z7902 Long term (current) use of antithrombotics/antiplatelets: Secondary | ICD-10-CM | POA: Diagnosis not present

## 2020-05-08 DIAGNOSIS — R110 Nausea: Secondary | ICD-10-CM | POA: Diagnosis not present

## 2020-05-08 DIAGNOSIS — N179 Acute kidney failure, unspecified: Secondary | ICD-10-CM | POA: Diagnosis not present

## 2020-05-08 DIAGNOSIS — G9389 Other specified disorders of brain: Secondary | ICD-10-CM | POA: Diagnosis not present

## 2020-05-08 DIAGNOSIS — I774 Celiac artery compression syndrome: Secondary | ICD-10-CM | POA: Diagnosis not present

## 2020-05-08 DIAGNOSIS — N189 Chronic kidney disease, unspecified: Secondary | ICD-10-CM | POA: Diagnosis not present

## 2020-05-08 DIAGNOSIS — E039 Hypothyroidism, unspecified: Secondary | ICD-10-CM | POA: Diagnosis not present

## 2020-05-08 DIAGNOSIS — M25461 Effusion, right knee: Secondary | ICD-10-CM | POA: Diagnosis not present

## 2020-05-08 DIAGNOSIS — R42 Dizziness and giddiness: Secondary | ICD-10-CM | POA: Diagnosis not present

## 2020-05-08 DIAGNOSIS — Z8616 Personal history of COVID-19: Secondary | ICD-10-CM | POA: Diagnosis not present

## 2020-05-08 DIAGNOSIS — K7581 Nonalcoholic steatohepatitis (NASH): Secondary | ICD-10-CM | POA: Diagnosis not present

## 2020-05-08 DIAGNOSIS — E871 Hypo-osmolality and hyponatremia: Secondary | ICD-10-CM | POA: Diagnosis not present

## 2020-05-08 DIAGNOSIS — M1711 Unilateral primary osteoarthritis, right knee: Secondary | ICD-10-CM | POA: Diagnosis not present

## 2020-05-08 DIAGNOSIS — G319 Degenerative disease of nervous system, unspecified: Secondary | ICD-10-CM | POA: Diagnosis not present

## 2020-05-08 DIAGNOSIS — E876 Hypokalemia: Secondary | ICD-10-CM | POA: Diagnosis not present

## 2020-05-08 DIAGNOSIS — E1142 Type 2 diabetes mellitus with diabetic polyneuropathy: Secondary | ICD-10-CM | POA: Diagnosis not present

## 2020-05-08 DIAGNOSIS — I2510 Atherosclerotic heart disease of native coronary artery without angina pectoris: Secondary | ICD-10-CM | POA: Diagnosis not present

## 2020-05-08 DIAGNOSIS — R9431 Abnormal electrocardiogram [ECG] [EKG]: Secondary | ICD-10-CM | POA: Diagnosis not present

## 2020-05-08 DIAGNOSIS — W1839XA Other fall on same level, initial encounter: Secondary | ICD-10-CM | POA: Diagnosis not present

## 2020-05-08 DIAGNOSIS — R0902 Hypoxemia: Secondary | ICD-10-CM | POA: Diagnosis not present

## 2020-05-08 DIAGNOSIS — N1832 Chronic kidney disease, stage 3b: Secondary | ICD-10-CM | POA: Diagnosis not present

## 2020-05-08 DIAGNOSIS — R109 Unspecified abdominal pain: Secondary | ICD-10-CM | POA: Diagnosis not present

## 2020-05-08 DIAGNOSIS — E1122 Type 2 diabetes mellitus with diabetic chronic kidney disease: Secondary | ICD-10-CM | POA: Diagnosis not present

## 2020-05-08 DIAGNOSIS — S0003XA Contusion of scalp, initial encounter: Secondary | ICD-10-CM | POA: Diagnosis not present

## 2020-05-08 DIAGNOSIS — R600 Localized edema: Secondary | ICD-10-CM | POA: Diagnosis not present

## 2020-05-08 DIAGNOSIS — R519 Headache, unspecified: Secondary | ICD-10-CM | POA: Diagnosis not present

## 2020-05-08 DIAGNOSIS — R55 Syncope and collapse: Secondary | ICD-10-CM | POA: Diagnosis not present

## 2020-05-08 DIAGNOSIS — E785 Hyperlipidemia, unspecified: Secondary | ICD-10-CM | POA: Diagnosis not present

## 2020-06-09 DIAGNOSIS — N200 Calculus of kidney: Secondary | ICD-10-CM | POA: Diagnosis not present

## 2020-06-12 DIAGNOSIS — Z794 Long term (current) use of insulin: Secondary | ICD-10-CM | POA: Diagnosis not present

## 2020-06-12 DIAGNOSIS — N183 Chronic kidney disease, stage 3 unspecified: Secondary | ICD-10-CM | POA: Diagnosis not present

## 2020-06-12 DIAGNOSIS — Z789 Other specified health status: Secondary | ICD-10-CM | POA: Diagnosis not present

## 2020-06-12 DIAGNOSIS — K861 Other chronic pancreatitis: Secondary | ICD-10-CM | POA: Diagnosis not present

## 2020-06-12 DIAGNOSIS — E1122 Type 2 diabetes mellitus with diabetic chronic kidney disease: Secondary | ICD-10-CM | POA: Diagnosis not present

## 2020-06-12 DIAGNOSIS — E785 Hyperlipidemia, unspecified: Secondary | ICD-10-CM | POA: Diagnosis not present

## 2020-06-12 DIAGNOSIS — Z8616 Personal history of COVID-19: Secondary | ICD-10-CM | POA: Diagnosis not present

## 2020-06-12 DIAGNOSIS — M940 Chondrocostal junction syndrome [Tietze]: Secondary | ICD-10-CM | POA: Diagnosis not present

## 2020-06-12 DIAGNOSIS — Z0184 Encounter for antibody response examination: Secondary | ICD-10-CM | POA: Diagnosis not present

## 2020-06-12 DIAGNOSIS — E538 Deficiency of other specified B group vitamins: Secondary | ICD-10-CM | POA: Diagnosis not present

## 2020-06-12 DIAGNOSIS — I129 Hypertensive chronic kidney disease with stage 1 through stage 4 chronic kidney disease, or unspecified chronic kidney disease: Secondary | ICD-10-CM | POA: Diagnosis not present

## 2020-07-04 DIAGNOSIS — K5730 Diverticulosis of large intestine without perforation or abscess without bleeding: Secondary | ICD-10-CM | POA: Diagnosis not present

## 2020-07-04 DIAGNOSIS — E278 Other specified disorders of adrenal gland: Secondary | ICD-10-CM | POA: Diagnosis not present

## 2020-07-04 DIAGNOSIS — D3500 Benign neoplasm of unspecified adrenal gland: Secondary | ICD-10-CM | POA: Diagnosis not present

## 2020-07-04 DIAGNOSIS — N200 Calculus of kidney: Secondary | ICD-10-CM | POA: Diagnosis not present

## 2020-07-20 DIAGNOSIS — M545 Low back pain, unspecified: Secondary | ICD-10-CM | POA: Diagnosis not present

## 2020-07-20 DIAGNOSIS — M25511 Pain in right shoulder: Secondary | ICD-10-CM | POA: Diagnosis not present

## 2020-07-20 DIAGNOSIS — S46091A Other injury of muscle(s) and tendon(s) of the rotator cuff of right shoulder, initial encounter: Secondary | ICD-10-CM | POA: Diagnosis not present

## 2020-07-20 DIAGNOSIS — M542 Cervicalgia: Secondary | ICD-10-CM | POA: Diagnosis not present

## 2020-07-20 DIAGNOSIS — M546 Pain in thoracic spine: Secondary | ICD-10-CM | POA: Diagnosis not present

## 2020-07-20 DIAGNOSIS — M5414 Radiculopathy, thoracic region: Secondary | ICD-10-CM | POA: Diagnosis not present

## 2020-07-25 ENCOUNTER — Other Ambulatory Visit: Payer: Self-pay | Admitting: Cardiology

## 2020-08-14 DIAGNOSIS — M25511 Pain in right shoulder: Secondary | ICD-10-CM | POA: Diagnosis not present

## 2020-08-14 DIAGNOSIS — M4804 Spinal stenosis, thoracic region: Secondary | ICD-10-CM | POA: Diagnosis not present

## 2020-08-14 DIAGNOSIS — M75121 Complete rotator cuff tear or rupture of right shoulder, not specified as traumatic: Secondary | ICD-10-CM | POA: Diagnosis not present

## 2020-08-14 DIAGNOSIS — M67811 Other specified disorders of synovium, right shoulder: Secondary | ICD-10-CM | POA: Diagnosis not present

## 2020-08-14 DIAGNOSIS — M4724 Other spondylosis with radiculopathy, thoracic region: Secondary | ICD-10-CM | POA: Diagnosis not present

## 2020-08-18 DIAGNOSIS — J209 Acute bronchitis, unspecified: Secondary | ICD-10-CM | POA: Diagnosis not present

## 2020-08-18 DIAGNOSIS — R051 Acute cough: Secondary | ICD-10-CM | POA: Diagnosis not present

## 2020-08-18 DIAGNOSIS — Z1152 Encounter for screening for COVID-19: Secondary | ICD-10-CM | POA: Diagnosis not present

## 2020-08-23 DIAGNOSIS — M79675 Pain in left toe(s): Secondary | ICD-10-CM | POA: Diagnosis not present

## 2020-08-23 DIAGNOSIS — M2012 Hallux valgus (acquired), left foot: Secondary | ICD-10-CM | POA: Diagnosis not present

## 2020-08-23 DIAGNOSIS — M2042 Other hammer toe(s) (acquired), left foot: Secondary | ICD-10-CM | POA: Diagnosis not present

## 2020-08-23 DIAGNOSIS — M79674 Pain in right toe(s): Secondary | ICD-10-CM | POA: Diagnosis not present

## 2020-08-23 DIAGNOSIS — M2011 Hallux valgus (acquired), right foot: Secondary | ICD-10-CM | POA: Diagnosis not present

## 2020-08-23 DIAGNOSIS — B351 Tinea unguium: Secondary | ICD-10-CM | POA: Diagnosis not present

## 2020-08-23 DIAGNOSIS — E1140 Type 2 diabetes mellitus with diabetic neuropathy, unspecified: Secondary | ICD-10-CM | POA: Diagnosis not present

## 2020-08-23 DIAGNOSIS — M2041 Other hammer toe(s) (acquired), right foot: Secondary | ICD-10-CM | POA: Diagnosis not present

## 2020-08-30 DIAGNOSIS — M5414 Radiculopathy, thoracic region: Secondary | ICD-10-CM | POA: Diagnosis not present

## 2020-09-27 ENCOUNTER — Other Ambulatory Visit: Payer: Self-pay | Admitting: Cardiology

## 2020-10-09 ENCOUNTER — Ambulatory Visit: Payer: Medicare Other | Admitting: Cardiology

## 2020-10-10 ENCOUNTER — Other Ambulatory Visit: Payer: Self-pay

## 2020-10-10 ENCOUNTER — Ambulatory Visit (INDEPENDENT_AMBULATORY_CARE_PROVIDER_SITE_OTHER): Payer: Medicare Other | Admitting: Cardiology

## 2020-10-10 ENCOUNTER — Encounter: Payer: Self-pay | Admitting: Cardiology

## 2020-10-10 VITALS — BP 136/66 | HR 68 | Ht 66.0 in | Wt 193.2 lb

## 2020-10-10 DIAGNOSIS — I10 Essential (primary) hypertension: Secondary | ICD-10-CM

## 2020-10-10 DIAGNOSIS — R0600 Dyspnea, unspecified: Secondary | ICD-10-CM | POA: Diagnosis not present

## 2020-10-10 DIAGNOSIS — M940 Chondrocostal junction syndrome [Tietze]: Secondary | ICD-10-CM | POA: Diagnosis not present

## 2020-10-10 DIAGNOSIS — E669 Obesity, unspecified: Secondary | ICD-10-CM | POA: Diagnosis not present

## 2020-10-10 DIAGNOSIS — I5030 Unspecified diastolic (congestive) heart failure: Secondary | ICD-10-CM | POA: Diagnosis not present

## 2020-10-10 DIAGNOSIS — E785 Hyperlipidemia, unspecified: Secondary | ICD-10-CM

## 2020-10-10 DIAGNOSIS — I2510 Atherosclerotic heart disease of native coronary artery without angina pectoris: Secondary | ICD-10-CM

## 2020-10-10 DIAGNOSIS — I25119 Atherosclerotic heart disease of native coronary artery with unspecified angina pectoris: Secondary | ICD-10-CM | POA: Diagnosis not present

## 2020-10-10 DIAGNOSIS — I87302 Chronic venous hypertension (idiopathic) without complications of left lower extremity: Secondary | ICD-10-CM

## 2020-10-10 DIAGNOSIS — I2109 ST elevation (STEMI) myocardial infarction involving other coronary artery of anterior wall: Secondary | ICD-10-CM | POA: Diagnosis not present

## 2020-10-10 DIAGNOSIS — E66811 Obesity, class 1: Secondary | ICD-10-CM

## 2020-10-10 DIAGNOSIS — R002 Palpitations: Secondary | ICD-10-CM

## 2020-10-10 DIAGNOSIS — Z9861 Coronary angioplasty status: Secondary | ICD-10-CM

## 2020-10-10 DIAGNOSIS — R0609 Other forms of dyspnea: Secondary | ICD-10-CM

## 2020-10-10 MED ORDER — FUROSEMIDE 80 MG PO TABS: ORAL_TABLET | ORAL | 3 refills | Status: DC

## 2020-10-10 MED ORDER — INDAPAMIDE 2.5 MG PO TABS: 2.5000 mg | ORAL_TABLET | ORAL | 7 refills | Status: DC | PRN

## 2020-10-10 NOTE — Patient Instructions (Signed)
Medication Instructions:   Lasix-- take everyday one tablet , may take an additional  If needed  do not use the Lozol  Only as needed - you tried taking at least 2 tablets of the lasix in one day then you may take the Lozol the next day .  *If you need a refill on your cardiac medications before your next appointment, please call your pharmacy*   Lab Work:  Not needed   Testing/Procedures: Not needed   Follow-Up: At [facility], you and your health needs are our priority.  As part of our continuing mission to provide you with exceptional heart care, we have created designated Provider Care Teams.  These Care Teams include your primary Cardiologist (physician) and Advanced Practice Providers (APPs -  Physician Assistants and Nurse Practitioners) who all work together to provide you with the care you need, when you need it.     Your next appointment:   2 month(s)  The format for your next appointment:   In Person  Provider:   Glenetta Hew, MD   Other Instructions  Call the office with the name of the medicine you received for you may possible can use for your chest discomfort

## 2020-10-10 NOTE — Progress Notes (Signed)
Primary Care Provider: Kristopher Glee., MD Cardiologist: Glenetta Hew, MD Electrophysiologist: None  Clinic Note: Chief Complaint  Patient presents with  . Chest Pain    I have had 2 or 3 heart attacks since Christmas  . Coronary Artery Disease    Complains of chest  . Follow-up   ===================================  ASSESSMENT/PLAN   Problem List Items Addressed This Visit    CAD S/P percutaneous coronary angioplasty: pRCA BMS, mLAD BMS overlapped prox with DES for ISR (Chronic)    Last PCI was in 2012.  She does have peripheral artery stents.  Coronary stents were patent in 2016.  She continues to complain of chest discomfort, but it seems more musculoskeletal in nature.  Plan: Continue clopidogrel-okay to hold 5 days preop for surgery procedures.      Relevant Medications   indapamide (LOZOL) 2.5 MG tablet   furosemide (LASIX) 80 MG tablet   Other Relevant Orders   EKG 12-Lead (Completed)   Congestive heart failure with LV diastolic dysfunction, NYHA class 2 (HCC) (Chronic)    Has had evidence of elevated LVEDP on multiple different evaluations.  She does have  mild amount of edema pretty well controlled with indapamide.  She has noted intermittent dizziness.  I wonder if she is really dehydrated.  Encourage adequate hydration  Plan:  Continue carvedilol at current dose along with losartan and Imdur  Continue spironolactone 3 days a week.   continue daily furosemide 80 mg daily and convert indapamide to PRN for worsening edema.      Relevant Medications   indapamide (LOZOL) 2.5 MG tablet   furosemide (LASIX) 80 MG tablet   Other Relevant Orders   EKG 12-Lead (Completed)   Dyslipidemia, goal LDL below 70 - WITH STATIN INTOLERANCE (Chronic)   Relevant Medications   indapamide (LOZOL) 2.5 MG tablet   furosemide (LASIX) 80 MG tablet   MI (myocardial infarction) (HCC) (Chronic)    I do not recall ever seeing any evidence of true ACS when I was seeing her.   There is apparently a post infarct MI while she is being followed in [facility].  Nothing abnormal seen on cardiac catheterization there either.  She has had no evidence of ischemia or infarct on Myoview and no wall motion normality on echocardiogram.      Relevant Medications   indapamide (LOZOL) 2.5 MG tablet   furosemide (LASIX) 80 MG tablet   Exertional dyspnea (Chronic)    Mostly related to being deconditioned.  Myoview was nonischemic last year.  No real change in symptoms.  Recommended increased exercise.      Costochondritis - Primary (Chronic)    Reproducible chest pain on exam.  In the past we have done treated with NSAIDs, which did help.  I have a little leery of using NSAIDs now because of concern for renal insufficiency.  I told her to use as needed Tylenol or Motrin. Also heat compresses.      Relevant Orders   EKG 12-Lead (Completed)   Obesity (BMI 30.0-34.9) (Chronic)    She lost a lot of weight with her GI upset issues, but is now just borderline obese. She may have a tendency to eat sweets and snacks, but the main issue is lack of exercise.      Stasis edema of bilateral lower extremity (Chronic)    I truthfully think her edema is probably more related to venous stasis than related to heart failure.   Plan  I do recommend foot  elevation and support stockings.  Convert indapamide to as needed only and reduce standing dose of Lasix 80 mg daily.      Essential hypertension (Chronic)    Her pressures seem to be up-and-down.  She has some orthostatic dizziness and lightheadedness which is probably related to dehydration.  Backing off on diuretic dosing. Continue current dose of losartan and carvedilol along with spironolactone.      Relevant Medications   indapamide (LOZOL) 2.5 MG tablet   furosemide (LASIX) 80 MG tablet   Palpitation (Chronic)    The syncope relatively stable on current dose of beta-blocker.  Intermittently occur, but not enough to change  dosing.      Relevant Orders   EKG 12-Lead (Completed)   Coronary artery disease involving native coronary artery of native heart with angina pectoris ([place]) (Chronic)    She continues to have his episodes of chest pain.  She has been evaluated now with a Myoview last year showing no evidence of ischemia.  Chest pain she describing is relatively atypical and is now reproducible on exam.  I suspect this is related to costochondritis but not CAD.  Plan: Continue with current dose of carvedilol and Imdur. On Plavix      Relevant Medications   indapamide (LOZOL) 2.5 MG tablet   furosemide (LASIX) 80 MG tablet     ===================================  HPI:    [name] is a 80 y.o. female with a PMH below who presents today for routine follow-up and complaints of chest pain.  History of Known CAD-PCI, PAF,  PAD /splanchnic artery disease.  She also is troubled by frequent chest pain with extensive negative evaluations (likely MSK-costochondritis)  CAD: pRCA & mLAD x 2 in 2011 (BMS stents used for preop back surgery),  Post-op back Sgx, had recurrent angina -->DES x 2 LAD for ISR  Relook Cath 03/16/2015: Widely patent LAD stents with D2 jailed.  Minimal D2 stenosis.  Proximal to mid RCA BMS with less than 10% ISR.  OM1 and D1 60% stenosis.-Stable.  Elevated LVEDP of 20 mmHg.  Myoview May 2018: Normal EF (hyperdynamic LV function EF greater than 65%). LOW RISK. No ischemia or infarction. => Myoview 2021 also negative for ischemia (see below)  PAD: - RAS - s/p renal stent; celiac/SMA stenosis s/p SMA stent->followed by vascular surgery  Also has very difficult to control lipids with Statin Intolerance and all To take other medications. - Has been evaluated by our [facility] pharmacy team - but stopped going->she prefers to avoid discussion and treatment of her lipids. Not interested in new medications.  She also has chronic lower extremity edema probably related to some diastolic  function also venous insufficiency. Usually controlled with Lasix and metolazone.  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  She pretty much overworked herself, and gets somewhat fatigued.  Maybe little more short of breath than usual but no worsening edema.  Occasional chest pain off and on. She had no intention of getting a COVID-19 vaccine=> interestingly, her husband had a very prolonged course of Covid in the fall followed by pneumonia in December-January.  (She is still reluctant)  Because of continued complaints of chest pain, Myoview ordered.  Recent Hospitalizations: No hospitalizations, but has been evaluated intermittently for nephrolithiasis.  Reviewed  CV studies:    The following studies were reviewed today: (if available, images/films reviewed:  From Epic Chart or Care Everywhere)  Myoview 01/05/2020: :EF 60-65%.  No EKG changes.  LOW RISK.  No infarct, no ischemia.  Interval History:   [name] returns today earlier than originally planned because of having several episodes of prolonged chest pain.  She has had several spells lasting up to an hour.  Apparently she was sick after Christmas and had several episodes of chest pain associated with that.  She had also been coughing quite a bit.  Most recent spell was last night.  She had 2 severe symptoms in the interim from January to now where she was having chest pain while baking in the kitchen.  She will feel dizzy and lightheaded.  Swelling is pretty well controlled she might have some dizziness and lightheadedness when standing up.  She mentions that she thinks she at least had 2 or 3 heart attacks since last time she saw me.  CV Review of Symptoms (Summary): positive for - chest pain, dyspnea on exertion and Dizziness and lightheadedness; Pretty well controlled edema.   negative for - irregular  heartbeat, orthopnea, palpitations, paroxysmal nocturnal dyspnea, rapid heart rate or Syncope or near syncope, TIA/amaurosis fugax, claudication  The patient does not have symptoms concerning for COVID-19 infection (fever, chills, cough, or new shortness of breath).   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 Negative for weight loss.  HENT: Negative for congestion and sinus pain.   Respiratory: Positive for cough and shortness of breath. Negative for sputum production.        Recently with URI or cough/cold from December to recently.  Cardiovascular: Positive for leg swelling (Stable, mild-well-controlled).  Gastrointestinal: Positive for abdominal pain and constipation. Negative for blood in stool, diarrhea and melena.  Genitourinary: Negative for flank pain (Intermittently with nephrolithiasis, but not currently.) and hematuria.  Musculoskeletal: Positive for back pain and joint pain. Negative for falls.  Neurological: Positive for dizziness (Positional, usually when she does drink enough). Negative for focal weakness and weakness.  Psychiatric/Behavioral: Negative for depression and memory loss. The patient is nervous/anxious. The patient does not have insomnia.    I have reviewed and (if needed) personally updated the patient's problem list, medications, allergies, past medical and surgical history, social and family history.   PAST MEDICAL HISTORY   Past Medical History:  Diagnosis Date  . Arthritis    "fingers, right shoulder" (09/09/2017)  . Atrial fibrillation ([place])   . Back pain with radiation    Chronic Back Pain - mutliple surgeries (including tumor removal)  . Bilateral edema of lower extremity    Chronic, likely related to venous stasis  . CAD S/P percutaneous coronary angioplasty    a) LHC: 07/30/10. -- 3.0x69mm Integrity BMS to pRCA & 2.5x 37mm Integrity BMS mLAD(@ D2).  b)  Class III-IV Angina 03/2011: LHC- ISR in LAD BMS -- prox overlapping Promus DES 2.5x69mm and PTCA of jailed D2 ostium-prox 80%. c) 02/03/12:  LHC- patent stents.  Jailed diagonal. with stable flow; d) Peri-OP NSTEMI 04/2012 - LHC in 12/'13 -   . Celiac artery stenosis ([place])    12/2016 - STENT placement  . Complication of anesthesia    "used to wake up wild years ago" (09/09/2017)  . Congestive heart failure with LV diastolic dysfunction, NYHA class 2 ([place])    06/13/10:  last 2D echo-  EF >55%, Mild TR, Mod Conc LVH - Grade 1 diastolic dysfunction (  abnormal relaxation) --> LVEDP on Cath 28 mmHg & mild 2nd Pulm HTN  . Diet-controlled type 2 diabetes mellitus ([place])   . Diverticulitis of colon (without mention of hemorrhage)(562.11)   . Dyslipidemia, goal LDL below 70    Intolerant to statins  . GERD (gastroesophageal reflux disease)   . Hepatitis ~ 1957   "yellow jaundice" (01/14/2017)  . Hepatomegaly   . Hiatal hernia   . History of blood transfusion 04/2012   "when I had a heart attack"  . History of kidney stones    "I've got a stone embedded in one of my kidneys" (09/09/2017)  . History of stomach ulcers "years ago"  . Irritable bowel syndrome (IBS)   . Labile essential hypertension    Partially related to RAS  . Mesenteric artery stenosis (HCC)    95% Celiac Artery - ostial, 20-30% SMA.  Bilateral Renal A: L RA stent patent, R RA 20-30% -- Conservative Management  . NSTEMI (non-ST elevated myocardial infarction) ([place]) 04/2012   Unclear the details, apparently this was postoperative from her back surgery that she was cleared for my last saw her in June.  Reportedly had stents placed  . Pancreas divisum    On pancreatic enzyme  . Renal artery stenosis ([place]) 2011; 12/'13   a) Angiogram 02/03/12:  50-60%L RA stenosis, 40% R R Inferior artery; b) 12/'13: S/P L RA Stent ([facility]) 6.0 mm x 15 mm; c) Renal Duplex 10/2013: <60% L RA, <60 R RA, ~60% SMA & Celiac A.  . Stricture and stenosis of  esophagus   . Unspecified gastritis and gastroduodenitis without mention of hemorrhage     PAST SURGICAL HISTORY   Past Surgical History:  Procedure Laterality Date  . APPENDECTOMY    . BACK SURGERY    . BREAST DUCTAL SYSTEM EXCISION     right  . BREAST SURGERY Right   . CARDIAC CATHETERIZATION N/A 03/16/2015   Procedure: Left Heart Cath and Coronary Angiography;  Surgeon: Leonie Man, MD;  Location: [facility];  Service: Cardiovascular; Widely patent m-dLAD stents as wellas pRCA stent.  High LVEDP, small Diag & Om vessels with moderate stenosis   . Cardiac Event Monitor  01/2017   Mostly NSR with occasional sinus tachycardia and rare bradycardia. No A. fib. No PND or PSVT. Rare PACs and PVCs.  Marland Kitchen CARDIOVASCULAR STRESS TEST     11/07/11:  Normal perfusion pattern.  EF 66% No wall motion abnormalities.   Marland Kitchen CATARACT EXTRACTION W/ INTRAOCULAR LENS  IMPLANT, BILATERAL Bilateral   . CHOLECYSTECTOMY OPEN    . COLONOSCOPY  03/01/2009   normal   . CORONARY ANGIOPLASTY WITH STENT PLACEMENT  07/30/2010   3.0x53mm Integrity BMS to RCA and 2.5x 52mm Integrity BMS LAD.    Marland Kitchen CORONARY ANGIOPLASTY WITH STENT PLACEMENT  03/18/2011   Cutting Balloon PTCA of D2 (jailed - 80% ostial stenosis), DES PCI of mid LAD ISR - > Promus DES 2.5 x 16 mm postdilated to 2.8 mm) covering the proximal portion of the previous stent  . DOBUTAMINE STRESS ECHO  03/07/2015   [facility] (ordered for pre-op evaluation for EUS/ERCP --> abnormal EKG:  strreesss test shhoowwed 1 mm ST segment depressions downsloping. No wall motion abnormalities at peak exercise or at rest. Diastolic dysfunction was noted but normal systolic function - EF greater than 55%. No bouts of regurgitation or stenosis. Resting hypertension with exaggerated response  . ESOPHAGOGASTRODUODENOSCOPY  04/08/2012  . ESOPHAGOGASTRODUODENOSCOPY (EGD) WITH ESOPHAGEAL DILATION  X 2  . FRACTURE SURGERY    . HAMMER TOE SURGERY Bilateral    "took bone off the top of  2nd toe on each foot"  . HIP SURGERY Left    "something to do w/my back"  . JOINT REPLACEMENT    . KNEE SURGERY Left    "had fluid drained off it a couple times"  . LEFT HEART CATH AND CORONARY ANGIOGRAPHY  07/30/2010   severe LAD-diagonal 80%, moderate to severe proximal RCA.  Mean PAP 15 mmHg.  PCWP mean 17 mmHg.  RVP 45/11 mmHg.  PAP 47/24 mmHg, mean 31 mmHg.  LVEDP 27 mmHg  . LEFT HEART CATHETERIZATION WITH CORONARY ANGIOGRAM N/A 02/03/2012   Procedure: LEFT HEART CATHETERIZATION WITH CORONARY ANGIOGRAM;  Surgeon: Leonie Man, MD;  Location: [facility] CATH LAB; widely patent LAD and RCA stents.  Patent D2 ostium.  Moderate L Renal A stenosis (56%), R Renal A 40-50% t.  LVEDP 20 mmHg.  Marland Kitchen LEFT HEART CATHETERIZATION WITH CORONARY ANGIOGRAM N/A 05/12/2014   Procedure: LEFT HEART CATHETERIZATION WITH CORONARY ANGIOGRAM;  Surgeon: Lorretta Harp, MD;  Location: [facility] CATH LAB;  Service: Cardiovascular: Stable CAD. Patent stents. Patent renal artery stent  . LEFT HEART CATHETERIZATION WITH CORONARY ANGIOGRAM  08/2012   Peri-Op MI @ [facility] -- 40% ostial D1, patent LAD stents, 10% RCA ISR  . LEFT HEART CATHETERIZATION WITH CORONARY ANGIOGRAM   03/18/2011   70% ISR of LAD stent just after D2 (D2 has ostial 80 to 90% stenosis.  20 to 30% ISR RCA.  EDP elevated at 28 mmHg  . NM MYOVIEW LTD  12/2016   LOW RISK study. No ischemia or infarction. EF 65-75%.  Marland Kitchen PANCREAS SURGERY     "stent in my pancreas"; Dr Pershing Proud  . PERCUTANEOUS PINNING PHALANX FRACTURE OF HAND Left ~ 2013  . PERIPHERAL VASCULAR BALLOON ANGIOPLASTY  09/09/2017   Procedure: PERIPHERAL VASCULAR BALLOON ANGIOPLASTY;  Surgeon: Serafina Mitchell, MD;  Location: [facility];  Service: Cardiovascular;;  Celiac instent  . PERIPHERAL VASCULAR INTERVENTION  01/14/2017   Procedure: Peripheral Vascular Intervention;  Surgeon: Serafina Mitchell, MD;  Location: [facility];  Service: Cardiovascular;;  mesentric  . RENAL ARTERY  STENT Left 08/2012   @ [facility] - 6.0 mm x 15 mm  . SPINE SURGERY     tumor removed 07/2010; Redo Surgery 04/2012; Sacroiliac Sgx 08/2013  . TOTAL ABDOMINAL HYSTERECTOMY    . TOTAL KNEE ARTHROPLASTY Left ~ 2012  . VISCERAL ANGIOGRAM N/A 05/12/2014   Procedure: VISCERAL ANGIOGRAM;  Surgeon: Lorretta Harp, MD;  Location: [facility] CATH LAB;  25% ostial proximal celiac artery with downward takeoff.  23% proximal SMA and 56% proximal IMA.  Left renal artery stent widely patent.  Right renal artery is 20 to 30% proximal stenosis.  Marland Kitchen VISCERAL ANGIOGRAPHY N/A 01/14/2017   Procedure: Mesenteric  Angiography;  Surgeon: Serafina Mitchell, MD;  Location: [facility];  Service: Cardiovascular;  Laterality: N/A;  . VISCERAL ANGIOGRAPHY N/A 09/09/2017   Procedure: VISCERAL ANGIOGRAPHY;  Surgeon: Serafina Mitchell, MD;  Location: [place] [facility];  Service: Cardiovascular;  Laterality: N/A;    Immunization History  Administered Date(s) Administered  . Tdap 03/19/2012  . Zoster 03/31/2009    MEDICATIONS/ALLERGIES   Current Meds  Medication Sig  . acetaminophen (TYLENOL) 500 MG tablet Take 2,000 mg by mouth 4 (four) times daily as needed for moderate pain.   Marland Kitchen  Artificial Tear Solution (TEARS RENEWED OP) Apply 1 drop to eye daily as needed (dry eyes).  . carvedilol (COREG) 6.25 MG tablet TAKE 1 TABLET BY MOUTH TWICE DAILY WITH A MEAL. MAY TAKE AN EXTRA TABLET DAILY IF HAVING PALPATIONS  . clopidogrel (PLAVIX) 75 MG tablet TAKE 1 TABLET(75 MG) BY MOUTH DAILY  . cyanocobalamin (,VITAMIN B-12,) 1000 MCG/ML injection Inject into the muscle.  . diclofenac sodium (VOLTAREN) 1 % GEL Apply small amount sparingly to the right and left footl  . isosorbide mononitrate (IMDUR) 30 MG 24 hr tablet TAKE 1 TABLET BY MOUTH EVERY DAY  . lidocaine (XYLOCAINE) 5 % ointment Apply 1 application topically at bedtime as needed (toe pain).   Marland Kitchen lidocaine (XYLOCAINE) 5 % ointment Apply small amount to the bottom of the  right foot 2-3 times per day  . Lidocaine 4 % PTCH Apply 1 patch topically daily as needed (pain).  . nitroGLYCERIN (NITROSTAT) 0.4 MG SL tablet DISSOLVE 1 TABLET UNDER THE TONGUE EVERY 5 MINUTES AS NEEDED FOR CHEST PAIN  . spironolactone (ALDACTONE) 25 MG tablet TAKE 1 TABLET BY MOUTH AS DIRECTED. TAKE 1 TABLET ON MONDAYS, WEDNESDAYS, AND FRIDAYS AT 6 PM.  . [DISCONTINUED] furosemide (LASIX) 80 MG tablet TAKE 1 TABLET(80 MG) BY MOUTH TWICE DAILY  . [DISCONTINUED] indapamide (LOZOL) 2.5 MG tablet TAKE ONE TABLET BY MOUTH DAILY, 30 MINUTES BEFORE DAILY FUROSEMIDE.  . [DISCONTINUED] losartan (COZAAR) 50 MG tablet TAKE 1 TABLET(50 MG) BY MOUTH DAILY    Allergies  Allergen Reactions  . Codeine Anaphylaxis  . Codeine Phosphate Anaphylaxis, Shortness Of Breath and Swelling  . Contrast Media [Iodinated Diagnostic Agents]     swelling  . Penicillin G Anaphylaxis  . Rosuvastatin Calcium Other (See Comments)    Intolerant  . Iodine Swelling and Rash    REACTION: unspecified This allergy is to Topical iodine only.   . Prednisone Itching and Rash    Keeps pt up all night   . Statins Other (See Comments)    Intolerant  Intolerant  . Tramadol Other (See Comments) and Palpitations    Kept patient awake Tramadol "makes me hyper".    SOCIAL HISTORY/FAMILY HISTORY   Reviewed in Epic:  Pertinent findings:  Social History   Tobacco Use  . Smoking status: Never Smoker  . Smokeless tobacco: Never Used  Vaping Use  . Vaping Use: Never used  Substance Use Topics  . Alcohol use: No  . Drug use: No   Social History   Social History Narrative   Married mother of one, one grandchild.   Very socially active, enjoys cooking and having get-togethers her house. She had been exercising regularly but her back pain limits her.   Does not smoke, does not drink alcohol.    OBJCTIVE -PE, EKG, labs   Wt Readings from Last 3 Encounters:  10/10/20 193 lb 3.2 oz (87.6 kg)  01/05/20 205 lb (93 kg)   12/22/19 205 lb 12.8 oz (93.4 kg)    Physical Exam: BP 136/66   Pulse 68   Ht 5\' 6"  (1.676 m)   Wt 193 lb 3.2 oz (87.6 kg)   BMI 31.18 kg/m  Physical Exam Vitals reviewed.  Constitutional:      General: She is not in acute distress.    Appearance: Normal appearance. She is obese. She is not ill-appearing or toxic-appearing.     Comments: Mildly obese.  Well-groomed.  HENT:     Head: Normocephalic and atraumatic.  Neck:  Vascular: No carotid bruit or JVD.  Cardiovascular:     Rate and Rhythm: Normal rate and regular rhythm. Occasional extrasystoles are present.    Chest Wall: PMI is not displaced.     Pulses: Normal pulses.     Heart sounds: Murmur heard.   Harsh crescendo-decrescendo early systolic murmur is present with a grade of 1/6 at the upper right sternal border radiating to the neck. No friction rub. Gallop present. S4 sounds present.   Pulmonary:     Effort: Pulmonary effort is normal.     Breath sounds: Normal breath sounds.  Chest:     Chest wall: Tenderness (Pinpoint tenderness along left and right costal sternal border.  This is where her "heart attack chest pain "is when she has her episodes.) present.  Musculoskeletal:        General: Swelling (Mild puffy 1+ edema.) present. Normal range of motion.     Cervical back: Normal range of motion and neck supple.  Skin:    General: Skin is warm and dry.     Coloration: Skin is not pale.  Neurological:     General: No focal deficit present.     Mental Status: She is alert and oriented to person, place, and time.  Psychiatric:        Mood and Affect: Mood normal.        Behavior: Behavior normal.        Thought Content: Thought content normal.        Judgment: Judgment normal.     Adult ECG Report  Rate: 68 ;  Rhythm: normal sinus rhythm and Nonspecific ST and T wave changes.  Cannot exclude inferior MI, age-indeterminate; normal axis, intervals durations.  Narrative Interpretation: Stable  Recent Labs:    [facility] Related to Lipid Profile Component 03/08/20  LDL Direct 109  Total Cholesterol 203  Triglycerides 435  HDL Cholesterol 33  Total Chol / HDL Cholesterol 6.2  Non-HDL Cholesterol 170    Comprehensive Metabolic Panel Component 79/89/21 05/11/20 05/10/20 05/09/20 05/08/20 04/14/20  Sodium 138 137 134Low 131Low 127Low 130Low  Potassium 4.5 4.0 3.7 3.5 2.8Low Panic  3.5  Chloride 99 103 100 96Low 86Low 90Low  CO2 30 28 28 24 28 28   BUN 28 47High 84High 97High 98High 43High  Glucose 99  105High  98  87  134High  139High   Creatinine 0.93 1.15 1.83High 2.31High 2.35High 1.30  Calcium 9.5 8.9 8.6 8.0Low 9.1 8.7  Total Protein 7.0  - - - 7.0  7.1   Albumin  4.1 - - - 3.8 3.8  Total Bilirubin 0.6  - - - 0.7  0.5   Alkaline Phosphatase 55 - - - 49 62  AST (SGOT) 11 - - - 13 17  ALT (SGPT) 8 - - - 12 10   Component 06/12/20 03/08/20  HEMOGLOBIN A1C 5.9  5.9High    CBC and Differential Component 06/12/20 05/11/20 05/10/20 05/09/20  WBC 6.0 6.9 6.3 6.6  RBC 3.71 3.50Low 3.60Low 3.55Low  Hemoglobin 11.0 10.2Low 10.4Low 10.5Low  Hematocrit 32.6 30.1Low 30.8Low 30.3Low  MCV 87.8 86.1 85.6 85.1  MCH 29.7 29.1 29.0 29.4  MCHC 33.8 33.8 33.9 34.6  RDW 15.8 14.6 14.4 14.1  Platelets 238 207 201 224     Lab Results  Component Value Date   CHOL 245 (H) 09/08/2019   HDL 36 (L) 09/08/2019   LDLCALC 152 (H) 09/08/2019   TRIG 305 (H) 09/08/2019   CHOLHDL 6.8 (  H) 09/08/2019   Lab Results  Component Value Date   CREATININE 0.92 09/08/2019   BUN 26 09/08/2019   NA 133 (L) 09/08/2019   K 4.2 09/08/2019   CL 91 (L) 09/08/2019   CO2 26 09/08/2019   CBC Latest Ref Rng & Units 09/10/2017 09/09/2017 05/27/2017  WBC 4.0 - 10.5 K/uL 7.8 - 7.3  Hemoglobin 12.0 - 15.0 g/dL 10.9(L) 11.6(L) 12.7  Hematocrit 36.0 - 46.0 % 33.6(L) 34.0(L) 36.5  Platelets 150 - 400 K/uL 261 - 259    Lab  Results  Component Value Date   TSH 2.40 10/24/2014    ==================================================  COVID-19 Education: The signs and symptoms of COVID-19 were discussed with the patient and how to seek care for testing (follow up with PCP or arrange E-visit).   The importance of social distancing and COVID-19 vaccination was discussed today. The patient is practicing social distancing & Masking.   I spent a total of 70minutes with the patient spent in direct patient consultation.  Additional time spent with chart review  / charting (studies, outside notes, etc): 20 min Total Time: 51 min   Current medicines are reviewed at length with the patient today.  (+/- concerns) n/a  This visit occurred during the SARS-CoV-2 public health emergency.  Safety protocols were in place, including screening questions prior to the visit, additional usage of staff PPE, and extensive cleaning of exam room while observing appropriate contact time as indicated for disinfecting solutions.  Notice: This dictation was prepared with Dragon dictation along with smaller phrase technology. Any transcriptional errors that result from this process are unintentional and may not be corrected upon review.  Patient Instructions / Medication Changes & Studies & Tests Ordered   Patient Instructions  Medication Instructions:   Lasix-- take everyday one tablet , may take an additional  If needed  do not use the Lozol  Only as needed - you tried taking at least 2 tablets of the lasix in one day then you may take the Lozol the next day .  *If you need a refill on your cardiac medications before your next appointment, please call your pharmacy*   Lab Work:  Not needed   Testing/Procedures: Not needed   Follow-Up: At [facility], you and your health needs are our priority.  As part of our continuing mission to provide you with exceptional heart care, we have created designated Provider Care Teams.   These Care Teams include your primary Cardiologist (physician) and Advanced Practice Providers (APPs -  Physician Assistants and Nurse Practitioners) who all work together to provide you with the care you need, when you need it.     Your next appointment:   2 month(s)  The format for your next appointment:   In Person  Provider:   Glenetta Hew, MD   Other Instructions  Call the office with the name of the medicine you received for you may possible can use for your chest discomfort     Studies Ordered:   Orders Placed This Encounter  Procedures  . EKG 12-Lead     Glenetta Hew, M.D., M.S. Interventional Cardiologist   Pager # [phone] Phone # [phone] [address] [place], [place] [address]   Thank you for choosing Heartcare at [facility]!!

## 2020-10-16 DIAGNOSIS — I480 Paroxysmal atrial fibrillation: Secondary | ICD-10-CM | POA: Insufficient documentation

## 2020-10-24 ENCOUNTER — Other Ambulatory Visit: Payer: Self-pay | Admitting: Cardiology

## 2020-10-29 ENCOUNTER — Encounter: Payer: Self-pay | Admitting: Cardiology

## 2020-10-29 NOTE — Assessment & Plan Note (Signed)
Reproducible chest pain on exam.  In the past we have done treated with NSAIDs, which did help.  I have a little leery of using NSAIDs now because of concern for renal insufficiency.  I told her to use as needed Tylenol or Motrin. Also heat compresses.

## 2020-10-29 NOTE — Assessment & Plan Note (Signed)
The syncope relatively stable on current dose of beta-blocker.  Intermittently occur, but not enough to change dosing.

## 2020-10-29 NOTE — Assessment & Plan Note (Signed)
Has had evidence of elevated LVEDP on multiple different evaluations.  She does have  mild amount of edema pretty well controlled with indapamide.  She has noted intermittent dizziness.  I wonder if she is really dehydrated.  Encourage adequate hydration  Plan:  Continue carvedilol at current dose along with losartan and Imdur  Continue spironolactone 3 days a week.   continue daily furosemide 80 mg daily and convert indapamide to PRN for worsening edema.

## 2020-10-29 NOTE — Assessment & Plan Note (Signed)
Her pressures seem to be up-and-down.  She has some orthostatic dizziness and lightheadedness which is probably related to dehydration.  Backing off on diuretic dosing. Continue current dose of losartan and carvedilol along with spironolactone.

## 2020-10-29 NOTE — Assessment & Plan Note (Signed)
Mostly related to being deconditioned.  Myoview was nonischemic last year.  No real change in symptoms.  Recommended increased exercise.

## 2020-10-29 NOTE — Assessment & Plan Note (Signed)
Last PCI was in 2012.  She does have peripheral artery stents.  Coronary stents were patent in 2016.  She continues to complain of chest discomfort, but it seems more musculoskeletal in nature.  Plan: Continue clopidogrel-okay to hold 5 days preop for surgery procedures.

## 2020-10-29 NOTE — Assessment & Plan Note (Signed)
I truthfully think her edema is probably more related to venous stasis than related to heart failure.   Plan  I do recommend foot elevation and support stockings.  Convert indapamide to as needed only and reduce standing dose of Lasix 80 mg daily.

## 2020-10-29 NOTE — Assessment & Plan Note (Signed)
She lost a lot of weight with her GI upset issues, but is now just borderline obese. She may have a tendency to eat sweets and snacks, but the main issue is lack of exercise.

## 2020-10-29 NOTE — Assessment & Plan Note (Signed)
She continues to have his episodes of chest pain.  She has been evaluated now with a Myoview last year showing no evidence of ischemia.  Chest pain she describing is relatively atypical and is now reproducible on exam.  I suspect this is related to costochondritis but not CAD.  Plan: Continue with current dose of carvedilol and Imdur. On Plavix

## 2020-10-29 NOTE — Assessment & Plan Note (Signed)
I do not recall ever seeing any evidence of true ACS when I was seeing her.  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  Nothing abnormal seen on cardiac catheterization there either.  She has had no evidence of ischemia or infarct on Myoview and no wall motion normality on echocardiogram.

## 2020-12-11 ENCOUNTER — Other Ambulatory Visit: Payer: Self-pay

## 2020-12-11 ENCOUNTER — Ambulatory Visit (INDEPENDENT_AMBULATORY_CARE_PROVIDER_SITE_OTHER): Payer: Medicare Other | Admitting: Cardiology

## 2020-12-11 ENCOUNTER — Encounter: Payer: Self-pay | Admitting: Cardiology

## 2020-12-11 VITALS — BP 126/58 | HR 77 | Ht 66.0 in | Wt 199.0 lb

## 2020-12-11 DIAGNOSIS — R0600 Dyspnea, unspecified: Secondary | ICD-10-CM

## 2020-12-11 DIAGNOSIS — I701 Atherosclerosis of renal artery: Secondary | ICD-10-CM | POA: Diagnosis not present

## 2020-12-11 DIAGNOSIS — M940 Chondrocostal junction syndrome [Tietze]: Secondary | ICD-10-CM | POA: Diagnosis not present

## 2020-12-11 DIAGNOSIS — I87302 Chronic venous hypertension (idiopathic) without complications of left lower extremity: Secondary | ICD-10-CM

## 2020-12-11 DIAGNOSIS — I2510 Atherosclerotic heart disease of native coronary artery without angina pectoris: Secondary | ICD-10-CM

## 2020-12-11 DIAGNOSIS — I25119 Atherosclerotic heart disease of native coronary artery with unspecified angina pectoris: Secondary | ICD-10-CM

## 2020-12-11 DIAGNOSIS — I739 Peripheral vascular disease, unspecified: Secondary | ICD-10-CM | POA: Diagnosis not present

## 2020-12-11 DIAGNOSIS — E669 Obesity, unspecified: Secondary | ICD-10-CM | POA: Diagnosis not present

## 2020-12-11 DIAGNOSIS — I10 Essential (primary) hypertension: Secondary | ICD-10-CM | POA: Diagnosis not present

## 2020-12-11 DIAGNOSIS — I2109 ST elevation (STEMI) myocardial infarction involving other coronary artery of anterior wall: Secondary | ICD-10-CM

## 2020-12-11 DIAGNOSIS — E785 Hyperlipidemia, unspecified: Secondary | ICD-10-CM

## 2020-12-11 DIAGNOSIS — T466X5A Adverse effect of antihyperlipidemic and antiarteriosclerotic drugs, initial encounter: Secondary | ICD-10-CM

## 2020-12-11 DIAGNOSIS — Z9861 Coronary angioplasty status: Secondary | ICD-10-CM

## 2020-12-11 DIAGNOSIS — M791 Myalgia, unspecified site: Secondary | ICD-10-CM | POA: Diagnosis not present

## 2020-12-11 DIAGNOSIS — Z7902 Long term (current) use of antithrombotics/antiplatelets: Secondary | ICD-10-CM | POA: Diagnosis not present

## 2020-12-11 DIAGNOSIS — I5030 Unspecified diastolic (congestive) heart failure: Secondary | ICD-10-CM

## 2020-12-11 DIAGNOSIS — R0609 Other forms of dyspnea: Secondary | ICD-10-CM

## 2020-12-11 DIAGNOSIS — E66811 Obesity, class 1: Secondary | ICD-10-CM

## 2020-12-11 MED ORDER — ISOSORBIDE MONONITRATE ER 30 MG PO TB24: 30.0000 mg | ORAL_TABLET | Freq: Every day | ORAL | 3 refills | Status: DC

## 2020-12-11 MED ORDER — NITROGLYCERIN 0.4 MG SL SUBL: SUBLINGUAL_TABLET | SUBLINGUAL | 4 refills | Status: DC

## 2020-12-11 NOTE — Patient Instructions (Signed)
Medication Instructions:  No changes.  *If you need a refill on your cardiac medications before your next appointment, please call your pharmacy*   Lab Work: None ordered  If you have labs (blood work) drawn today and your tests are completely normal, you will receive your results only by: Marland Kitchen MyChart Message (if you have MyChart) OR . A paper copy in the mail If you have any lab test that is abnormal or we need to change your treatment, we will call you to review the results.   Testing/Procedures: None ordered   Follow-Up: At [facility], you and your health needs are our priority.  As part of our continuing mission to provide you with exceptional heart care, we have created designated Provider Care Teams.  These Care Teams include your primary Cardiologist (physician) and Advanced Practice Providers (APPs -  Physician Assistants and Nurse Practitioners) who all work together to provide you with the care you need, when you need it.   Your next appointment:   3 month(s)  The format for your next appointment:   In Person  Provider:   Glenetta Hew, MD

## 2020-12-11 NOTE — Progress Notes (Signed)
Primary Care Provider: Kristopher Glee., MD Cardiologist: Glenetta Hew, MD Electrophysiologist: None  Clinic Note: Chief Complaint  Patient presents with  . Follow-up    2 months.  . Headache  . Shortness of Breath  . Edema  . Coronary Artery Disease    Still: Chest pain   ===================================  ASSESSMENT/PLAN   Problem List Items Addressed This Visit    Long term current use of antithrombotics/antiplatelets - clopidogrel    He remains on clopidogrel for her stents.  Tolerates clopidogrel better than aspirin.  No bleeding issues.   Okay to hold Plavix 5 days preop for surgery procedures.  7 days per neuro or spinal procedures.      CAD S/P percutaneous coronary angioplasty: pRCA BMS, mLAD BMS overlapped prox with DES for ISR - Primary (Chronic)    Last documented coronary artery PCI was in 2012.  Since then she has had multiple peripheral artery stents as well as renal artery stents placed.  She remains on maintenance clopidogrel which can be held. Is on longstanding Imdur at low-dose.  Okay to titrate up and down for worsening chest pain. She is on stable dose of carvedilol and losartan   She is now willing to discuss the possibility of [facility] lipid clinic follow-up appointment to discuss PCSK9 inhibitors..      Relevant Medications   nitroGLYCERIN (NITROSTAT) 0.4 MG SL tablet   isosorbide mononitrate (IMDUR) 30 MG 24 hr tablet   Congestive heart failure with LV diastolic dysfunction, NYHA class 2 (HCC) (Chronic)    She does have evidence of elevated BP, blood pressure is pretty well controlled with carvedilol, losartan and spironolactone.  She is also on heavy doses furosemide 80mg  daily with PRN additional doses of either furosemide or indapamide.  She also takes Imdur 30 mg daily.  Seems euvolemic on exam.  No PND orthopnea. Continue to recommend support stockings and foot elevation-see labs when I say that.      Relevant Medications    nitroGLYCERIN (NITROSTAT) 0.4 MG SL tablet   isosorbide mononitrate (IMDUR) 30 MG 24 hr tablet   Dyslipidemia, goal LDL below 70 - WITH STATIN INTOLERANCE (Chronic)    I think at this point she has a mental block with just about any kind of oral antilipid agent.  We spent a little time talking today about really needed to get a control of her lipids.  All of her other risk factors seem to be controlled.  In the past, she had considered using a PCSK9 inhibitors, but was fearful of injections.  She does seem a little more interested now and is willing to go back to [facility] lipid clinic when she has her lipids checked by her PCP in the spring.      Relevant Medications   nitroGLYCERIN (NITROSTAT) 0.4 MG SL tablet   isosorbide mononitrate (IMDUR) 30 MG 24 hr tablet   MI (myocardial infarction) (HCC) (Chronic)    There is a history of trivial mildly positive troponins.  Has not had true documented ACS since 2015.  She has had many episodes that she thinks have been heart attacks but there is no evidence of MI on Myoview.      Relevant Medications   nitroGLYCERIN (NITROSTAT) 0.4 MG SL tablet   isosorbide mononitrate (IMDUR) 30 MG 24 hr tablet   Exertional dyspnea (Chronic)    I have seen and evaluated multiple times for similar symptoms.  Normal EF on echo and Myoview.  Nonischemic Myoview last year.  Most likely related to obesity and deconditioning.      Renal artery stenosis (HCC) (Chronic)    Followed by vascular surgery.      Relevant Medications   nitroGLYCERIN (NITROSTAT) 0.4 MG SL tablet   isosorbide mononitrate (IMDUR) 30 MG 24 hr tablet   Costochondritis (Chronic)   Obesity (BMI 30.0-34.9) (Chronic)    She is put back on some of the weight that she had lost.  She was hoping to stop at about 185, but is now back up to 199.  She is hoping to start losing weight again.  She is very active, but I think she enjoys eating as much as she enjoys cooking and baking.  Now that her abdominal  symptoms have improved, she is gaining weight.      Stasis edema of bilateral lower extremity (Chronic)    Edema is probably is as much related to venous stasis as it is HFpEF.  Continue high-dose daptomycin Rex post Lasix. Continue blood pressure control. Again recommend foot elevation and support stockings.      Essential hypertension (Chronic)    Blood pressures well controlled on carvedilol and losartan at current doses along with spironolactone.  Uses the dose of furosemide (dosing adjusted by nephrology).  I suggested that she can still use indapamide as a PRN for worsening edema.      Relevant Medications   nitroGLYCERIN (NITROSTAT) 0.4 MG SL tablet   isosorbide mononitrate (IMDUR) 30 MG 24 hr tablet   PAD (peripheral artery disease) (HCC) (Chronic)    Follow-up vascular surgery      Relevant Medications   nitroGLYCERIN (NITROSTAT) 0.4 MG SL tablet   isosorbide mononitrate (IMDUR) 30 MG 24 hr tablet   Coronary artery disease involving native coronary artery of native heart with angina pectoris (HCC) (Chronic)   Relevant Medications   nitroGLYCERIN (NITROSTAT) 0.4 MG SL tablet   isosorbide mononitrate (IMDUR) 30 MG 24 hr tablet   Myalgia due to statin (Chronic)    She has not even tried any type of oral cholesterol medicine in years.  Before I knew her she had tried a couple statins and we tried a couple more.  She is trying Zetia as well.  Did not tolerate these medications.  She has myalgias symptoms but also has memory issues.  Plan to refer her back to [facility] clinical pharmacist as well in the lipid clinic once she gets her labs checked by PCP.  She is now ready to consider the possibility of using PCSK9 inhibitor.        ===================================  HPI:    [name] is a 80 y.o. female with a PMH below who presents today for routine follow-up and complaints of chest pain.  History of Known CAD-PCI, PAF,  PAD /splanchnic artery disease.  She also is  troubled by frequent chest pain with extensive negative evaluations (likely MSK-costochondritis)  CAD: pRCA & mLAD x 2 in 2011 (BMS stents used for preop back surgery),  Post-op back Sgx, had recurrent angina -->DES x 2 LAD for ISR  Relook Cath 03/16/2015: Widely patent LAD stents with D2 jailed.  Minimal D2 stenosis.  Proximal to mid RCA BMS with less than 10% ISR.  OM1 and D1 60% stenosis.-Stable.  Elevated LVEDP of 20 mmHg  Myoview 01/05/2020: :EF 60-65%.  No EKG changes.  LOW RISK.  No infarct, no ischemia.  PAD: - RAS - s/p renal stent; celiac/SMA stenosis s/p SMA stent->followed by vascular surgery  Also has very difficult  to control lipids with Statin Intolerance and all To take other medications. - Has been evaluated by our [facility] pharmacy team - but stopped going->she prefers to avoid discussion and treatment of her lipids. Not interested in new medications.  She also has chronic lower extremity edema probably related to some diastolic function also venous insufficiency. Usually controlled with Lasix and metolazone.  AIRAM RUNIONS was last seen on October 10, 2020 as a work in visit because of several episodes of prolonged chest pain that can last up to 1 hour.  She had a cold type illness over Christmas time and had several episodes of chest pain with that.  She been coughing quite a bit.  She had 2 significant episodes in the interim from Christmas until February where she had chest pain while baking in the kitchen.  Felt lightheaded and dizzy.  She felt like this was her heart attack pain).  She said she has had at least 2 or 3 Owen attacks since last time she saw me.  Swelling controlled.  Some positional dizziness.  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 No sense of palpitations or irregular heartbeats.  No syncope or near syncope.  Just some dizziness.  She wears herself out.  Symptoms felt to be costochondritis.  Recommended PRN use of Tylenol or Motrin as  well as heat compresses.  Chest pain was reproducible on exam.  Recent Hospitalizations: No hospitalizations, but has been evaluated intermittently for nephrolithiasis.  Reviewed  CV studies:    The following studies were reviewed today: (if available, images/films reviewed: From Epic Chart or Care Everywhere)    Interval History:   HENNESSY BARTEL returns today for 2-week follow-up indicating that she has had her real anginal type pains off and on since her last visit, but the chest wall pain symptoms seem to have improved.  She feels tired off and on but no real sense of fatigue per se.  She is just always on the go and does not stop.  Rare palpitations and rare off-and-on chest pain.  Not really associated with any particular activity.  Pretty much her classic symptoms she describes.  Every now and takes nitroglycerin.  Swelling controlled.  She has not been using indapamide, only using her furosemide.  CV Review of Symptoms (Summary): positive for - chest pain, dyspnea on exertion, edema, irregular heartbeat and Dizziness and lightheadedness; Pretty well controlled edema.   negative for - orthopnea, paroxysmal nocturnal dyspnea, rapid heart rate, shortness of breath or Syncope or near syncope, TIA/amaurosis fugax, claudication  The patient does not have symptoms concerning for COVID-19 infection (fever, chills, cough, or new shortness of breath).   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 Negative for weight loss (Weight has been stable).  HENT: Positive for congestion. Negative for sinus pain.   Respiratory: Positive for shortness of breath (Deconditioned.). Negative for cough and sputum production.        Recently with URI or cough/cold from December to recently.  Cardiovascular: Positive for leg swelling (Stable, mild-well-controlled).  Gastrointestinal: Positive for abdominal pain and  constipation. Negative for blood in stool, diarrhea, melena, nausea and vomiting.  Genitourinary: Negative for flank pain (Intermittently with nephrolithiasis, but not currently.) and hematuria.  Musculoskeletal: Positive for back pain and joint pain. Negative for falls.  Neurological: Positive for dizziness (Positional.  Needs to drink enough. ) and headaches. Negative for focal weakness  and weakness.  Psychiatric/Behavioral: Negative for depression and memory loss. The patient is nervous/anxious. The patient does not have insomnia.    I have reviewed and (if needed) personally updated the patient's problem list, medications, allergies, past medical and surgical history, social and family history.   PAST MEDICAL HISTORY   Past Medical History:  Diagnosis Date  . Arthritis    "fingers, right shoulder" (09/09/2017)  . Atrial fibrillation ([place])   . Back pain with radiation    Chronic Back Pain - mutliple surgeries (including tumor removal)  . Bilateral edema of lower extremity    Chronic, likely related to venous stasis  . CAD S/P percutaneous coronary angioplasty    a) LHC: 07/30/10. -- 3.0x28mm Integrity BMS to pRCA & 2.5x 64mm Integrity BMS mLAD(@ D2).  b) Class III-IV Angina 03/2011: LHC- ISR in LAD BMS -- prox overlapping Promus DES 2.5x33mm and PTCA of jailed D2 ostium-prox 80%. c) 02/03/12:  LHC- patent stents.  Jailed diagonal. with stable flow; d) Peri-OP NSTEMI 04/2012 - LHC in 12/'13 -   . Celiac artery stenosis ([place])    12/2016 - STENT placement  . Complication of anesthesia    "used to wake up wild years ago" (09/09/2017)  . Congestive heart failure with LV diastolic dysfunction, NYHA class 2 ([place])    06/13/10:  last 2D echo-  EF >55%, Mild TR, Mod Conc LVH - Grade 1 diastolic dysfunction (abnormal relaxation) --> LVEDP on Cath 28 mmHg & mild 2nd Pulm HTN  . Diet-controlled type 2 diabetes mellitus ([place])   . Diverticulitis of colon (without mention of hemorrhage)(562.11)   .  Dyslipidemia, goal LDL below 70    Intolerant to statins  . GERD (gastroesophageal reflux disease)   . Hepatitis ~ 1957   "yellow jaundice" (01/14/2017)  . Hepatomegaly   . Hiatal hernia   . History of blood transfusion 04/2012   "when I had a heart attack"  . History of kidney stones    "I've got a stone embedded in one of my kidneys" (09/09/2017)  . History of stomach ulcers "years ago"  . Irritable bowel syndrome (IBS)   . Labile essential hypertension    Partially related to RAS  . Mesenteric artery stenosis (HCC)    95% Celiac Artery - ostial, 20-30% SMA.  Bilateral Renal A: L RA stent patent, R RA 20-30% -- Conservative Management  . NSTEMI (non-ST elevated myocardial infarction) ([place]) 04/2012   Unclear the details, apparently this was postoperative from her back surgery that she was cleared for my last saw her in June.  Reportedly had stents placed  . Pancreas divisum    On pancreatic enzyme  . Renal artery stenosis ([place]) 2011; 12/'13   a) Angiogram 02/03/12:  50-60%L RA stenosis, 40% R R Inferior artery; b) 12/'13: S/P L RA Stent ([facility]) 6.0 mm x 15 mm; c) Renal Duplex 10/2013: <60% L RA, <60 R RA, ~60% SMA & Celiac A.  . Stricture and stenosis of esophagus   . Unspecified gastritis and gastroduodenitis without mention of hemorrhage     PAST SURGICAL HISTORY   Past Surgical History:  Procedure Laterality Date  . APPENDECTOMY    . BACK SURGERY    . BREAST DUCTAL SYSTEM EXCISION     right  . BREAST SURGERY Right   . CARDIAC CATHETERIZATION N/A 03/16/2015   Procedure: Left Heart Cath and Coronary Angiography;  Surgeon: Leonie Man, MD;  Location: [place] [facility];  Service: Cardiovascular; Widely  patent m-dLAD stents as wellas pRCA stent.  High LVEDP, small Diag & Om vessels with moderate stenosis   . Cardiac Event Monitor  01/2017   Mostly NSR with occasional sinus tachycardia and rare bradycardia. No A. fib. No PND or PSVT. Rare PACs and PVCs.  Marland Kitchen CATARACT  EXTRACTION W/ INTRAOCULAR LENS  IMPLANT, BILATERAL Bilateral   . CHOLECYSTECTOMY OPEN    . COLONOSCOPY  03/01/2009   normal   . CORONARY ANGIOPLASTY WITH STENT PLACEMENT  07/30/2010   3.0x18mm Integrity BMS to RCA and 2.5x 53mm Integrity BMS LAD.    Marland Kitchen CORONARY ANGIOPLASTY WITH STENT PLACEMENT  03/18/2011   Cutting Balloon PTCA of D2 (jailed - 80% ostial stenosis), DES PCI of mid LAD ISR - > Promus DES 2.5 x 16 mm postdilated to 2.8 mm) covering the proximal portion of the previous stent  . DOBUTAMINE STRESS ECHO  03/07/2015   [facility] (ordered for pre-op evaluation for EUS/ERCP --> abnormal EKG:  strreesss test shhoowwed 1 mm ST segment depressions downsloping. No wall motion abnormalities at peak exercise or at rest. Diastolic dysfunction was noted but normal systolic function - EF greater than 55%. No bouts of regurgitation or stenosis. Resting hypertension with exaggerated response  . ESOPHAGOGASTRODUODENOSCOPY  04/08/2012  . ESOPHAGOGASTRODUODENOSCOPY (EGD) WITH ESOPHAGEAL DILATION  X 2  . FRACTURE SURGERY    . HAMMER TOE SURGERY Bilateral    "took bone off the top of 2nd toe on each foot"  . HIP SURGERY Left    "something to do w/my back"  . JOINT REPLACEMENT    . KNEE SURGERY Left    "had fluid drained off it a couple times"  . LEFT HEART CATH AND CORONARY ANGIOGRAPHY  07/30/2010   severe LAD-diagonal 80%, moderate to severe proximal RCA.  Mean PAP 15 mmHg.  PCWP mean 17 mmHg.  RVP 45/11 mmHg.  PAP 47/24 mmHg, mean 31 mmHg.  LVEDP 27 mmHg  . LEFT HEART CATHETERIZATION WITH CORONARY ANGIOGRAM N/A 02/03/2012   Procedure: LEFT HEART CATHETERIZATION WITH CORONARY ANGIOGRAM;  Surgeon: Leonie Man, MD;  [facility]; widely patent LAD and RCA stents.  Patent D2 ostium.  Moderate L Renal A stenosis (56%), R Renal A 40-50% t.  LVEDP 20 mmHg.  Marland Kitchen LEFT HEART CATHETERIZATION WITH CORONARY ANGIOGRAM N/A 05/12/2014   Procedure: LEFT HEART CATHETERIZATION WITH CORONARY ANGIOGRAM;  Surgeon:  Lorretta Harp, MD;  Location: [facility] CATH LAB;  Service: Cardiovascular: Stable CAD. Patent stents. Patent renal artery stent  . LEFT HEART CATHETERIZATION WITH CORONARY ANGIOGRAM  08/2012   Peri-Op MI @ [facility] -- 40% ostial D1, patent LAD stents, 10% RCA ISR  . LEFT HEART CATHETERIZATION WITH CORONARY ANGIOGRAM   03/18/2011   70% ISR of LAD stent just after D2 (D2 has ostial 80 to 90% stenosis.  20 to 30% ISR RCA.  EDP elevated at 28 mmHg  . NM MYOVIEW LTD  12/2016   LOW RISK study. No ischemia or infarction. EF 65-75%.  . NM MYOVIEW LTD  01/05/2020    LOW RISK. EF 60-65%.  No EKG changes.  No infarct, no ischemia.  Marland Kitchen PANCREAS SURGERY     "stent in my pancreas"; Dr Pershing Proud  . PERCUTANEOUS PINNING PHALANX FRACTURE OF HAND Left ~ 2013  . PERIPHERAL VASCULAR BALLOON ANGIOPLASTY  09/09/2017   Procedure: PERIPHERAL VASCULAR BALLOON ANGIOPLASTY;  Surgeon: Serafina Mitchell, MD;  Location: [place] [facility];  Service: Cardiovascular;;  Celiac instent  .  PERIPHERAL VASCULAR INTERVENTION  01/14/2017   Procedure: Peripheral Vascular Intervention;  Surgeon: Serafina Mitchell, MD;  Location: [place] [facility];  Service: Cardiovascular;;  mesentric  . RENAL ARTERY STENT Left 08/2012   @ [facility] - 6.0 mm x 15 mm  . SPINE SURGERY     tumor removed 07/2010; Redo Surgery 04/2012; Sacroiliac Sgx 08/2013  . TOTAL ABDOMINAL HYSTERECTOMY    . TOTAL KNEE ARTHROPLASTY Left ~ 2012  . VISCERAL ANGIOGRAM N/A 05/12/2014   Procedure: VISCERAL ANGIOGRAM;  Surgeon: Lorretta Harp, MD;  Location: [facility] CATH LAB;  25% ostial proximal celiac artery with downward takeoff.  23% proximal SMA and 56% proximal IMA.  Left renal artery stent widely patent.  Right renal artery is 20 to 30% proximal stenosis.  Marland Kitchen VISCERAL ANGIOGRAPHY N/A 01/14/2017   Procedure: Mesenteric  Angiography;  Surgeon: Serafina Mitchell, MD;  Location: [place] [facility];  Service: Cardiovascular;  Laterality: N/A;  . VISCERAL  ANGIOGRAPHY N/A 09/09/2017   Procedure: VISCERAL ANGIOGRAPHY;  Surgeon: Serafina Mitchell, MD;  Location: [place] [facility];  Service: Cardiovascular;  Laterality: N/A;    Immunization History  Administered Date(s) Administered  . Tdap 03/19/2012  . Zoster 03/31/2009    MEDICATIONS/ALLERGIES   Current Meds  Medication Sig  . acetaminophen (TYLENOL) 500 MG tablet Take 2,000 mg by mouth 4 (four) times daily as needed for moderate pain.   . Artificial Tear Solution (TEARS RENEWED OP) Apply 1 drop to eye daily as needed (dry eyes).  . carvedilol (COREG) 6.25 MG tablet TAKE 1 TABLET BY MOUTH TWICE DAILY WITH A MEAL. MAY TAKE AN EXTRA TABLET DAILY IF HAVING PALPATIONS  . cyanocobalamin (,VITAMIN B-12,) 1000 MCG/ML injection Inject into the muscle.  . diclofenac sodium (VOLTAREN) 1 % GEL Apply small amount sparingly to the right and left footl  . furosemide (LASIX) 80 MG tablet TAKE 1 TABLET(80 MG) by once a day , may take an additional 80 mg if needed for swelling  . lidocaine (XYLOCAINE) 5 % ointment Apply 1 application topically at bedtime as needed (toe pain).   Marland Kitchen lidocaine (XYLOCAINE) 5 % ointment Apply small amount to the bottom of the right foot 2-3 times per day  . Lidocaine 4 % PTCH Apply 1 patch topically daily as needed (pain).  Marland Kitchen losartan (COZAAR) 50 MG tablet TAKE 1 TABLET EVERY DAY  . spironolactone (ALDACTONE) 25 MG tablet TAKE 1 TABLET BY MOUTH AS DIRECTED. TAKE 1 TABLET ON MONDAYS, WEDNESDAYS, AND FRIDAYS AT 6 PM.  . [DISCONTINUED] clopidogrel (PLAVIX) 75 MG tablet TAKE 1 TABLET(75 MG) BY MOUTH DAILY  . [DISCONTINUED] indapamide (LOZOL) 2.5 MG tablet Take 1 tablet (2.5 mg total) by mouth as needed. 30 min after using furosemide  . [DISCONTINUED] isosorbide mononitrate (IMDUR) 30 MG 24 hr tablet TAKE 1 TABLET BY MOUTH EVERY DAY  . [DISCONTINUED] nitroGLYCERIN (NITROSTAT) 0.4 MG SL tablet DISSOLVE 1 TABLET UNDER THE TONGUE EVERY 5 MINUTES AS NEEDED FOR CHEST PAIN    Allergies   Allergen Reactions  . Codeine Anaphylaxis  . Codeine Phosphate Anaphylaxis, Shortness Of Breath and Swelling  . Contrast Media [Iodinated Diagnostic Agents]     swelling  . Penicillin G Anaphylaxis  . Rosuvastatin Calcium Other (See Comments)    Intolerant  . Iodine Swelling and Rash    REACTION: unspecified This allergy is to Topical iodine only.   . Prednisone Itching and Rash    Keeps pt up all night   .  Statins Other (See Comments)    Intolerant  Intolerant  . Tramadol Other (See Comments) and Palpitations    Kept patient awake Tramadol "makes me hyper".    SOCIAL HISTORY/FAMILY HISTORY   Reviewed in Epic:  Pertinent findings:  Social History   Tobacco Use  . Smoking status: Never Smoker  . Smokeless tobacco: Never Used  Vaping Use  . Vaping Use: Never used  Substance Use Topics  . Alcohol use: No  . Drug use: No   Social History   Social History Narrative   Married mother of one, one grandchild.   Very socially active, enjoys cooking and having get-togethers her house. She had been exercising regularly but her back pain limits her.   Does not smoke, does not drink alcohol.    OBJCTIVE -PE, EKG, labs   Wt Readings from Last 3 Encounters:  12/11/20 199 lb (90.3 kg)  10/10/20 193 lb 3.2 oz (87.6 kg)  01/05/20 205 lb (93 kg)    Physical Exam: BP (!) 126/58 (BP Location: Left Arm, Patient Position: Sitting, Cuff Size: Large)   Pulse 77   Ht 5\' 6"  (1.676 m)   Wt 199 lb (90.3 kg)   BMI 32.12 kg/m  Physical Exam Vitals reviewed.  Constitutional:      General: She is not in acute distress.    Appearance: Normal appearance. She is obese. She is not ill-appearing or toxic-appearing.     Comments: Mildly obese.  Well-groomed.  HENT:     Head: Normocephalic and atraumatic.  Neck:     Vascular: No carotid bruit, hepatojugular reflux or JVD.  Cardiovascular:     Rate and Rhythm: Normal rate and regular rhythm. Occasional extrasystoles are present.     Chest Wall: PMI is not displaced.     Pulses: Normal pulses.     Heart sounds: Murmur heard.   Harsh crescendo-decrescendo early systolic murmur is present with a grade of 1/6 at the upper right sternal border radiating to the neck. No friction rub. Gallop present. S4 sounds present.   Pulmonary:     Effort: Pulmonary effort is normal.     Breath sounds: Normal breath sounds.  Chest:     Chest wall: Tenderness (Pinpoint tenderness along left and right costal sternal border.  This is where her "heart attack chest pain "is when she has her episodes.) present.  Musculoskeletal:        General: Swelling (Mild puffy 1+ edema.) present. Normal range of motion.     Cervical back: Normal range of motion and neck supple.  Skin:    General: Skin is warm and dry.     Coloration: Skin is not pale.  Neurological:     General: No focal deficit present.     Mental Status: She is alert and oriented to person, place, and time.     Motor: No weakness.  Psychiatric:        Mood and Affect: Mood normal.        Behavior: Behavior normal.        Thought Content: Thought content normal.        Judgment: Judgment normal.     Adult ECG Report Not checked at  Recent Labs:   [facility] Related to Lipid Profile Component 03/08/20  LDL Direct 109  Total Cholesterol 203  Triglycerides 435  HDL Cholesterol 33  Total Chol / HDL Cholesterol 6.2  Non-HDL Cholesterol 170    Comprehensive Metabolic Panel Component 50/09/38 05/11/20  05/10/20  Sodium 138 137 134Low  Potassium 4.5 4.0 3.7  Chloride 99 103 100  CO2 30 28 28   BUN 28 47High 84High  Glucose 99  105High  98   Creatinine 0.93 1.15 1.83High  Calcium 9.5 8.9 8.6  Total Protein 7.0  -- --  Albumin  4.1 -- --  Total Bilirubin 0.6  -- --  Alkaline Phosphatase 55 -- --  AST (SGOT) 11 -- --  ALT (SGPT) 8 -- --   Component 06/12/20 03/08/20  HEMOGLOBIN A1C 5.9  5.9High    CBC and  Differential Component 06/12/20 05/11/20 05/10/20 05/09/20  WBC 6.0 6.9 6.3 6.6  RBC 3.71 3.50Low 3.60Low 3.55Low  Hemoglobin 11.0 10.2Low 10.4Low 10.5Low  Hematocrit 32.6 30.1Low 30.8Low 30.3Low  MCV 87.8 86.1 85.6 85.1  MCH 29.7 29.1 29.0 29.4  MCHC 33.8 33.8 33.9 34.6  RDW 15.8 14.6 14.4 14.1  Platelets 238 207 201 224     Lab Results  Component Value Date   CHOL 245 (H) 09/08/2019   HDL 36 (L) 09/08/2019   LDLCALC 152 (H) 09/08/2019   TRIG 305 (H) 09/08/2019   CHOLHDL 6.8 (H) 09/08/2019   Lab Results  Component Value Date   CREATININE 0.92 09/08/2019   BUN 26 09/08/2019   NA 133 (L) 09/08/2019   K 4.2 09/08/2019   CL 91 (L) 09/08/2019   CO2 26 09/08/2019    ==================================================  COVID-19 Education: The signs and symptoms of COVID-19 were discussed with the patient and how to seek care for testing (follow up with PCP or arrange E-visit).   The importance of social distancing and COVID-19 vaccination was discussed today. The patient is practicing social distancing & Masking.   I spent a total of 29 minutes with the patient spent in direct patient consultation.  Additional time spent with chart review  / charting (studies, outside notes, etc): 43min Total Time: 41 min   Current medicines are reviewed at length with the patient today.  (+/- concerns) n/a  This visit occurred during the SARS-CoV-2 public health emergency.  Safety protocols were in place, including screening questions prior to the visit, additional usage of staff PPE, and extensive cleaning of exam room while observing appropriate contact time as indicated for disinfecting solutions.  Notice: This dictation was prepared with Dragon dictation along with smaller phrase technology. Any transcriptional errors that result from this process are unintentional and may not be corrected upon review.  Patient Instructions / Medication Changes & Studies & Tests  Ordered   Patient Instructions  Medication Instructions:  No changes.  *If you need a refill on your cardiac medications before your next appointment, please call your pharmacy*   Lab Work: None ordered  If you have labs (blood work) drawn today and your tests are completely normal, you will receive your results only by: Marland Kitchen MyChart Message (if you have MyChart) OR . A paper copy in the mail If you have any lab test that is abnormal or we need to change your treatment, we will call you to review the results.   Testing/Procedures: None ordered   Follow-Up: At [facility], you and your health needs are our priority.  As part of our continuing mission to provide you with exceptional heart care, we have created designated Provider Care Teams.  These Care Teams include your primary Cardiologist (physician) and Advanced Practice Providers (APPs -  Physician Assistants and Nurse Practitioners) who all work together to provide you with the care you need, when  you need it.   Your next appointment:   3 month(s)  The format for your next appointment:   In Person  Provider:   Glenetta Hew, MD      Studies Ordered:   No orders of the defined types were placed in this encounter.    Glenetta Hew, M.D., M.S. Interventional Cardiologist   Pager # [phone] Phone # [phone] [place], [place] [address]   Thank you for choosing [facility] at [facility]!!

## 2020-12-18 ENCOUNTER — Other Ambulatory Visit: Payer: Self-pay | Admitting: Cardiology

## 2020-12-18 NOTE — Telephone Encounter (Signed)
Rx has been sent to the pharmacy electronically. ° °

## 2020-12-28 ENCOUNTER — Other Ambulatory Visit: Payer: Self-pay | Admitting: Cardiology

## 2020-12-28 ENCOUNTER — Encounter: Payer: Self-pay | Admitting: Cardiology

## 2020-12-28 NOTE — Assessment & Plan Note (Signed)
Blood pressures well controlled on carvedilol and losartan at current doses along with spironolactone.  Uses the dose of furosemide (dosing adjusted by nephrology).  I suggested that she can still use indapamide as a PRN for worsening edema.

## 2020-12-28 NOTE — Assessment & Plan Note (Signed)
Followed by vascular surgery. 

## 2020-12-28 NOTE — Assessment & Plan Note (Signed)
He remains on clopidogrel for her stents.  Tolerates clopidogrel better than aspirin.  No bleeding issues.   Okay to hold Plavix 5 days preop for surgery procedures.  7 days per neuro or spinal procedures.

## 2020-12-28 NOTE — Assessment & Plan Note (Signed)
I think at this point she has a mental block with just about any kind of oral antilipid agent.  We spent a little time talking today about really needed to get a control of her lipids.  All of her other risk factors seem to be controlled.  In the past, she had considered using a PCSK9 inhibitors, but was fearful of injections.  She does seem a little more interested now and is willing to go back to [facility] lipid clinic when she has her lipids checked by her PCP in the spring.

## 2020-12-28 NOTE — Assessment & Plan Note (Signed)
Follow-up vascular surgery. 

## 2020-12-28 NOTE — Assessment & Plan Note (Signed)
She does have evidence of elevated BP, blood pressure is pretty well controlled with carvedilol, losartan and spironolactone.  She is also on heavy doses furosemide 80mg  daily with PRN additional doses of either furosemide or indapamide.  She also takes Imdur 30 mg daily.  Seems euvolemic on exam.  No PND orthopnea. Continue to recommend support stockings and foot elevation-see labs when I say that.

## 2020-12-28 NOTE — Assessment & Plan Note (Addendum)
She is put back on some of the weight that she had lost.  She was hoping to stop at about 185, but is now back up to 199.  She is hoping to start losing weight again.  She is very active, but I think she enjoys eating as much as she enjoys cooking and baking.  Now that her abdominal symptoms have improved, she is gaining weight.

## 2020-12-28 NOTE — Assessment & Plan Note (Signed)
Last documented coronary artery PCI was in 2012.  Since then she has had multiple peripheral artery stents as well as renal artery stents placed.  She remains on maintenance clopidogrel which can be held. Is on longstanding Imdur at low-dose.  Okay to titrate up and down for worsening chest pain. She is on stable dose of carvedilol and losartan   She is now willing to discuss the possibility of [facility] lipid clinic follow-up appointment to discuss PCSK9 inhibitors.Marland Kitchen

## 2020-12-28 NOTE — Assessment & Plan Note (Signed)
She has not even tried any type of oral cholesterol medicine in years.  Before I knew her she had tried a couple statins and we tried a couple more.  She is trying Zetia as well.  Did not tolerate these medications.  She has myalgias symptoms but also has memory issues.  Plan to refer her back to [facility] clinical pharmacist as well in the lipid clinic once she gets her labs checked by PCP.  She is now ready to consider the possibility of using PCSK9 inhibitor.

## 2020-12-28 NOTE — Assessment & Plan Note (Signed)
Edema is probably is as much related to venous stasis as it is HFpEF.  Continue high-dose daptomycin Rex post Lasix. Continue blood pressure control. Again recommend foot elevation and support stockings.

## 2020-12-28 NOTE — Assessment & Plan Note (Signed)
There is a history of trivial mildly positive troponins.  Has not had true documented ACS since 2015.  She has had many episodes that she thinks have been heart attacks but there is no evidence of MI on Myoview.

## 2020-12-28 NOTE — Assessment & Plan Note (Signed)
I have seen and evaluated multiple times for similar symptoms.  Normal EF on echo and Myoview.  Nonischemic Myoview last year.  Most likely related to obesity and deconditioning.

## 2021-01-04 ENCOUNTER — Telehealth: Payer: Self-pay | Admitting: Cardiology

## 2021-01-04 MED ORDER — FUROSEMIDE 80 MG PO TABS
ORAL_TABLET | ORAL | 3 refills | Status: DC
Start: 2021-01-04 — End: 2021-10-12

## 2021-01-04 NOTE — Telephone Encounter (Signed)
PT is calling requesting a callback from the nurse to discuss a personal matter.Please advise

## 2021-01-04 NOTE — Telephone Encounter (Signed)
Requesting refill on lasix 80 mg  Advised that refill sent

## 2021-02-21 ENCOUNTER — Other Ambulatory Visit: Payer: Self-pay | Admitting: Cardiology

## 2021-02-26 DIAGNOSIS — L821 Other seborrheic keratosis: Secondary | ICD-10-CM | POA: Diagnosis not present

## 2021-02-26 DIAGNOSIS — L218 Other seborrheic dermatitis: Secondary | ICD-10-CM | POA: Diagnosis not present

## 2021-02-26 DIAGNOSIS — L820 Inflamed seborrheic keratosis: Secondary | ICD-10-CM | POA: Diagnosis not present

## 2021-03-01 DIAGNOSIS — M79675 Pain in left toe(s): Secondary | ICD-10-CM | POA: Diagnosis not present

## 2021-03-01 DIAGNOSIS — M2042 Other hammer toe(s) (acquired), left foot: Secondary | ICD-10-CM | POA: Diagnosis not present

## 2021-03-01 DIAGNOSIS — M2012 Hallux valgus (acquired), left foot: Secondary | ICD-10-CM | POA: Diagnosis not present

## 2021-03-01 DIAGNOSIS — B351 Tinea unguium: Secondary | ICD-10-CM | POA: Diagnosis not present

## 2021-03-01 DIAGNOSIS — M79674 Pain in right toe(s): Secondary | ICD-10-CM | POA: Diagnosis not present

## 2021-03-01 DIAGNOSIS — M2011 Hallux valgus (acquired), right foot: Secondary | ICD-10-CM | POA: Diagnosis not present

## 2021-03-01 DIAGNOSIS — M2041 Other hammer toe(s) (acquired), right foot: Secondary | ICD-10-CM | POA: Diagnosis not present

## 2021-03-06 DIAGNOSIS — Z20822 Contact with and (suspected) exposure to covid-19: Secondary | ICD-10-CM | POA: Diagnosis not present

## 2021-03-08 ENCOUNTER — Telehealth: Payer: Self-pay | Admitting: Cardiology

## 2021-03-08 NOTE — Telephone Encounter (Signed)
Spoke to patient. She has question about an upcoming appointments. Question answered.

## 2021-03-08 NOTE — Telephone Encounter (Signed)
Patient called to speak to Dr. Allison Quarry nurse only

## 2021-03-22 ENCOUNTER — Ambulatory Visit (INDEPENDENT_AMBULATORY_CARE_PROVIDER_SITE_OTHER): Payer: Medicare Other

## 2021-03-22 ENCOUNTER — Encounter: Payer: Self-pay | Admitting: Cardiology

## 2021-03-22 ENCOUNTER — Ambulatory Visit (INDEPENDENT_AMBULATORY_CARE_PROVIDER_SITE_OTHER): Payer: Medicare Other | Admitting: Cardiology

## 2021-03-22 ENCOUNTER — Other Ambulatory Visit: Payer: Self-pay

## 2021-03-22 VITALS — BP 132/68 | HR 72 | Ht 66.0 in | Wt 199.4 lb

## 2021-03-22 DIAGNOSIS — Z9861 Coronary angioplasty status: Secondary | ICD-10-CM

## 2021-03-22 DIAGNOSIS — M940 Chondrocostal junction syndrome [Tietze]: Secondary | ICD-10-CM | POA: Diagnosis not present

## 2021-03-22 DIAGNOSIS — R002 Palpitations: Secondary | ICD-10-CM

## 2021-03-22 DIAGNOSIS — I5030 Unspecified diastolic (congestive) heart failure: Secondary | ICD-10-CM

## 2021-03-22 DIAGNOSIS — I87302 Chronic venous hypertension (idiopathic) without complications of left lower extremity: Secondary | ICD-10-CM

## 2021-03-22 DIAGNOSIS — I10 Essential (primary) hypertension: Secondary | ICD-10-CM

## 2021-03-22 DIAGNOSIS — E669 Obesity, unspecified: Secondary | ICD-10-CM | POA: Diagnosis not present

## 2021-03-22 DIAGNOSIS — R0609 Other forms of dyspnea: Secondary | ICD-10-CM

## 2021-03-22 DIAGNOSIS — I25119 Atherosclerotic heart disease of native coronary artery with unspecified angina pectoris: Secondary | ICD-10-CM | POA: Diagnosis not present

## 2021-03-22 DIAGNOSIS — T466X5A Adverse effect of antihyperlipidemic and antiarteriosclerotic drugs, initial encounter: Secondary | ICD-10-CM

## 2021-03-22 DIAGNOSIS — R079 Chest pain, unspecified: Secondary | ICD-10-CM

## 2021-03-22 DIAGNOSIS — M791 Myalgia, unspecified site: Secondary | ICD-10-CM | POA: Diagnosis not present

## 2021-03-22 DIAGNOSIS — I2510 Atherosclerotic heart disease of native coronary artery without angina pectoris: Secondary | ICD-10-CM

## 2021-03-22 DIAGNOSIS — E1169 Type 2 diabetes mellitus with other specified complication: Secondary | ICD-10-CM | POA: Diagnosis not present

## 2021-03-22 DIAGNOSIS — R0600 Dyspnea, unspecified: Secondary | ICD-10-CM

## 2021-03-22 DIAGNOSIS — I701 Atherosclerosis of renal artery: Secondary | ICD-10-CM

## 2021-03-22 DIAGNOSIS — E785 Hyperlipidemia, unspecified: Secondary | ICD-10-CM

## 2021-03-22 MED ORDER — ISOSORBIDE MONONITRATE ER 60 MG PO TB24: 60.0000 mg | ORAL_TABLET | Freq: Every day | ORAL | 3 refills | Status: DC

## 2021-03-22 NOTE — Progress Notes (Signed)
Primary Care Provider: Kristopher Glee., MD Cardiologist: Glenetta Hew, MD Electrophysiologist: None  Clinic Note: Chief Complaint  Patient presents with   Follow-up    66-month   Palpitations    Intermittent fast heart rate spells with history of PAF   Coronary Artery Disease    Persistent chest pain.  No change     ===================================  ASSESSMENT/PLAN   Problem List Items Addressed This Visit     Chest pain with low risk for cardiac etiology    Chronic episodic chest pain that is not necessarily exertional.  She continues to think that she is having angina, but this has been evaluated with multiple different tests including neither cath.  Most recent Myoview last year showed no evidence of ischemia or infarction.  On the off chance that there may be some microvascular disease, will increase Imdur dose to 60 mg.  Since she is now complaining of palpitations, will check Zio patch for 7 days.       CAD S/P percutaneous coronary angioplasty: pRCA BMS, mLAD BMS overlapped prox with DES for ISR (Chronic)    Overlapping LAD stents as well as PCI of the RCA.  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  Plan: Continue maintenance clopidogrel 70 mg daily, but okay to hold 5 days preop for surgeries or procedures.  (7 days for high risk procedures)       Relevant Medications   isosorbide mononitrate (IMDUR) 60 MG 24 hr tablet   Other Relevant Orders   EKG 12-Lead (Completed)   LONG TERM MONITOR (3-14 DAYS)   Congestive heart failure with LV diastolic dysfunction, NYHA class 2 (HCC) (Chronic)   Relevant Medications   isosorbide mononitrate (IMDUR) 60 MG 24 hr tablet   Other Relevant Orders   EKG 12-Lead (Completed)   LONG TERM MONITOR (3-14 DAYS)   Hyperlipidemia associated with type 2 diabetes mellitus ([place]) (Chronic)    She clearly has a mental block on any medications that are designed to treat cholesterol.  I do  not think she truly had symptoms with some of them.  But she will not take them.  She has tried atorvastatin, rosuvastatin, simvastatin and pravastatin all of which caused side effects of myalgias and arthralgias. Similar symptoms are noted with ezetimibe.  She really does not want to discuss.  But she does understand that we need to recheck labs soon.  Can order fasting lipids and chemistry panel if not ordered by PCP by next visit.  I still think Inclisiran may be a potential option.       Exertional dyspnea (Chronic)    Probably related to deconditioning.  Normal EF on echo and nonischemic Myoview last year.  Similar symptoms.       Costochondritis (Chronic)    She continues to have this intermittent musculoskeletal pain which she continues to think is her angina.  I suggested trying nitroglycerin, and if that does not help, using Tylenol and Motrin.       Obesity (BMI 30.0-34.9) (Chronic)    She does need to get back down below 190.  We discussed importance of a diet modification and increasing exercise level.  She is always on the go, but needs to do some walking.       Palpitations - Primary    Now complaining of more frequent palpitations.  She does have a history of PAF that was perioperatively and no prior breakthroughs.  Since she is  somewhat symptomatic at this point, will order Zio patch monitor. Continue beta-blocker for now.       Relevant Orders   LONG TERM MONITOR (3-14 DAYS)   Stasis edema of bilateral lower extremity (Chronic)    ContinueAs much related to venous stasis as it is to HFpEF.  Commend foot elevation.  She is not currently wearing support stockings, but will do so in the wintertime.  Discussed use of indapamide and Lasix with additional doses as needed.  She is reluctant to take additional doses because it makes her urinate too much.       Essential hypertension (Chronic)    Blood pressure seems to be pretty well controlled on current dose  of carvedilol, losartan and spironolactone along with indapamide and Lasix       Relevant Medications   isosorbide mononitrate (IMDUR) 60 MG 24 hr tablet   Coronary artery disease involving native coronary artery of native heart with angina pectoris (HCC) (Chronic)    Frequent episodes of intermittent chest pain have been evaluated on multiple occasions with either Myoview stress test or relook cath.  This point I do not think we need any further testing.  Plan: Continue current dose of carvedilol and increase Imdur dose to 60 mg. Continue losartan and spironolactone along with clopidogrel. Not on statin because of intolerance/myalgias.      Relevant Medications   isosorbide mononitrate (IMDUR) 60 MG 24 hr tablet   Other Relevant Orders   EKG 12-Lead (Completed)   LONG TERM MONITOR (3-14 DAYS)   Myalgia due to statin (Chronic)    See dyslipidemia        ===================================  HPI:    [name] is a 80 y.o. female with a PMH notable for CAD, PAD /Splanchnic Artery Disease, HFpEF, PAF (without recent recurrence), and chronic nonanginal chest pain which she calls angina who presents today for 83-month follow-up.  Cardiac History CAD: pRCA & mLAD x 2 in 2011 (BMS stents used for preop back surgery),  Post-op back Sgx, had recurrent angina --> DES x 2 LAD for ISR Relook Cath 03/16/2015: Widely patent LAD stents with D2 jailed.  Minimal D2 stenosis.  Proximal to mid RCA BMS with less than 10% ISR.  OM1 and D1 60% stenosis.-Stable. Myoview 01/2020: EF 60-65%> No EKG changes. No Infarct or Ischemia.  LOW RISK PAD/Renal Artery/Splanchnic Artery Disease RAS - w/p Renal A Stent; Celiac & SMA stenosis - SMA stent HLD -complete refusal to take statins or other medications such as Nexletol/Zetia.  Has declined CVRR plans to start PCSK9 inhibitor, even potentially Inclisiran. Chronic Lower Extremity Edema with mild HFpEF  DEANNA WIATER was last seen on December 11, 2020 with  complaints of recurrent off and on "angina chest pain "since last visit, but the "chest wall pains "have improved-rarely takes nitroglycerin.Marland Kitchen  Despite the fact that she complains of this discomfort, she continues to be very active with baking and cooking.  Does not necessarily notice any fatigue per se.  She is always on the go.  Rare palpitations.  Swelling was pretty well controlled on indapamide/furosemide combination.  Recent Hospitalizations: None  Reviewed  CV studies:    The following studies were reviewed today: (if available, images/films reviewed: From Epic Chart or Care Everywhere) None:   Interval History:   LATROYA NG returns here today still with intermittent complaints of chest discomfort, but more notably she describes episodic palpitations associated with shortness of breath.  She thinks her stamina is decreasing  and she is more short of breath with exertion.  This seems to be more prominent than her "chest pain".   Edema seems to be a little worse but usually pretty steady.  No PND or orthopnea.  She is does have these palpitation spells which may make her feel little bit dizzy and lightheaded as well as short of breath.  She has not had any near-syncope or syncope.   CV Review of Symptoms (Summary) Cardiovascular ROS: positive for - chest pain, dyspnea on exertion, edema, irregular heartbeat, palpitations, rapid heart rate, and dizziness and lightheadedness, without near syncope negative for - loss of consciousness, shortness of breath, or TIA/amaurosis fugax, claudication  REVIEWED OF SYSTEMS   Review of Systems  Constitutional:  Positive for malaise/fatigue (Maybe little more easily fatigued). Negative for weight loss (Unfortunately, her weight is gone the wrong direction).  HENT:  Negative for congestion and nosebleeds.   Respiratory:  Negative for cough and shortness of breath (Only with exertion).   Cardiovascular:  Positive for palpitations and leg swelling  (Stable).  Gastrointestinal:  Negative for blood in stool and melena.  Genitourinary:  Negative for hematuria.  Musculoskeletal:  Positive for joint pain. Negative for falls.  Neurological:  Positive for dizziness. Negative for focal weakness and weakness.  Psychiatric/Behavioral:  Negative for depression and memory loss. The patient is nervous/anxious. The patient does not have insomnia.    I have reviewed and (if needed) personally updated the patient's problem list, medications, allergies, past medical and surgical history, social and family history.   PAST MEDICAL HISTORY   Past Medical History:  Diagnosis Date   Arthritis    "fingers, right shoulder" (09/09/2017)   Atrial fibrillation (HCC)    Back pain with radiation    Chronic Back Pain - mutliple surgeries (including tumor removal)   Bilateral edema of lower extremity    Chronic, likely related to venous stasis   CAD S/P percutaneous coronary angioplasty    a) LHC: 07/30/10. -- 3.0x72mm Integrity BMS to pRCA & 2.5x 45mm Integrity BMS mLAD(@ D2).  b) Class III-IV Angina 03/2011: LHC- ISR in LAD BMS -- prox overlapping Promus DES 2.5x40mm and PTCA of jailed D2 ostium-prox 80%. c) 02/03/12:  LHC- patent stents.  Jailed diagonal. with stable flow; d) Peri-OP NSTEMI 04/2012 - LHC in 12/'13 -    Celiac artery stenosis ([place])    12/2016 - STENT placement   Complication of anesthesia    "used to wake up wild years ago" (09/09/2017)   Congestive heart failure with LV diastolic dysfunction, NYHA class 2 ([place])    06/13/10:  last 2D echo-  EF >55%, Mild TR, Mod Conc LVH - Grade 1 diastolic dysfunction (abnormal relaxation) --> LVEDP on Cath 28 mmHg & mild 2nd Pulm HTN   Diet-controlled type 2 diabetes mellitus ([place])    Diverticulitis of colon (without mention of hemorrhage)(562.11)    Dyslipidemia, goal LDL below 70    Intolerant to statins   GERD (gastroesophageal reflux disease)    Hepatitis ~ 1957   "yellow jaundice" (01/14/2017)    Hepatomegaly    Hiatal hernia    History of blood transfusion 04/2012   "when I had a heart attack"   History of kidney stones    "I've got a stone embedded in one of my kidneys" (09/09/2017)   History of stomach ulcers "years ago"   Irritable bowel syndrome (IBS)    Labile essential hypertension    Partially related to RAS   Mesenteric artery  stenosis (HCC)    95% Celiac Artery - ostial, 20-30% SMA.  Bilateral Renal A: L RA stent patent, R RA 20-30% -- Conservative Management   NSTEMI (non-ST elevated myocardial infarction) ([place]) 04/2012   Unclear the details, apparently this was postoperative from her back surgery that she was cleared for my last saw her in June.  Reportedly had stents placed   Pancreas divisum    On pancreatic enzyme   Renal artery stenosis ([place]) 2011; 12/'13   a) Angiogram 02/03/12:  50-60%L RA stenosis, 40% R R Inferior artery; b) 12/'13: S/P L RA Stent ([facility]) 6.0 mm x 15 mm; c) Renal Duplex 10/2013: <60% L RA, <60 R RA, ~60% SMA & Celiac A.   Stricture and stenosis of esophagus    Unspecified gastritis and gastroduodenitis without mention of hemorrhage     PAST SURGICAL HISTORY   Past Surgical History:  Procedure Laterality Date   APPENDECTOMY     BACK SURGERY     BREAST DUCTAL SYSTEM EXCISION     right   BREAST SURGERY Right    CARDIAC CATHETERIZATION N/A 03/16/2015   Procedure: Left Heart Cath and Coronary Angiography;  Surgeon: Leonie Man, MD;  Location: [facility];  Service: Cardiovascular; Widely patent m-dLAD stents as wellas pRCA stent.  High LVEDP, small Diag & Om vessels with moderate stenosis    Cardiac Event Monitor  01/2017   Mostly NSR with occasional sinus tachycardia and rare bradycardia. No A. fib. No PND or PSVT. Rare PACs and PVCs.   CATARACT EXTRACTION W/ INTRAOCULAR LENS  IMPLANT, BILATERAL Bilateral    CHOLECYSTECTOMY OPEN     COLONOSCOPY  03/01/2009   normal    CORONARY ANGIOPLASTY WITH STENT PLACEMENT  07/30/2010    3.0x54mm Integrity BMS to RCA and 2.5x 82mm Integrity BMS LAD.     CORONARY ANGIOPLASTY WITH STENT PLACEMENT  03/18/2011   Cutting Balloon PTCA of D2 (jailed - 80% ostial stenosis), DES PCI of mid LAD ISR - > Promus DES 2.5 x 16 mm postdilated to 2.8 mm) covering the proximal portion of the previous stent   DOBUTAMINE STRESS ECHO  03/07/2015   [facility] (ordered for pre-op evaluation for EUS/ERCP --> abnormal EKG:  strreesss test shhoowwed 1 mm ST segment depressions downsloping. No wall motion abnormalities at peak exercise or at rest. Diastolic dysfunction was noted but normal systolic function - EF greater than 55%. No bouts of regurgitation or stenosis. Resting hypertension with exaggerated response   ESOPHAGOGASTRODUODENOSCOPY  04/08/2012   ESOPHAGOGASTRODUODENOSCOPY (EGD) WITH ESOPHAGEAL DILATION  X 2   FRACTURE SURGERY     HAMMER TOE SURGERY Bilateral    "took bone off the top of 2nd toe on each foot"   HIP SURGERY Left    "something to do w/my back"   JOINT REPLACEMENT     KNEE SURGERY Left    "had fluid drained off it a couple times"   LEFT HEART CATH AND CORONARY ANGIOGRAPHY  07/30/2010   severe LAD-diagonal 80%, moderate to severe proximal RCA.  Mean PAP 15 mmHg.  PCWP mean 17 mmHg.  RVP 45/11 mmHg.  PAP 47/24 mmHg, mean 31 mmHg.  LVEDP 27 mmHg   LEFT HEART CATHETERIZATION WITH CORONARY ANGIOGRAM N/A 02/03/2012   Procedure: LEFT HEART CATHETERIZATION WITH CORONARY ANGIOGRAM;  Surgeon: Leonie Man, MD;  Location: [facility] CATH LAB; widely patent LAD and RCA stents.  Patent D2 ostium.  Moderate L Renal A stenosis (56%), R Renal A 40-50%  t.  LVEDP 20 mmHg.   LEFT HEART CATHETERIZATION WITH CORONARY ANGIOGRAM N/A 05/12/2014   Procedure: LEFT HEART CATHETERIZATION WITH CORONARY ANGIOGRAM;  Surgeon: Lorretta Harp, MD;  Location: [facility] CATH LAB;  Service: Cardiovascular: Stable CAD. Patent stents. Patent renal artery stent   LEFT HEART CATHETERIZATION WITH CORONARY ANGIOGRAM  08/2012   Peri-Op MI @  [facility] -- 40% ostial D1, patent LAD stents, 10% RCA ISR   LEFT HEART CATHETERIZATION WITH CORONARY ANGIOGRAM   03/18/2011   70% ISR of LAD stent just after D2 (D2 has ostial 80 to 90% stenosis.  20 to 30% ISR RCA.  EDP elevated at 28 mmHg   NM MYOVIEW LTD  12/2016   LOW RISK study. No ischemia or infarction. EF 65-75%.   NM MYOVIEW LTD  01/05/2020    LOW RISK. EF 60-65%.  No EKG changes.  No infarct, no ischemia.   PANCREAS SURGERY     "stent in my pancreas"; Dr Pershing Proud   PERCUTANEOUS PINNING PHALANX FRACTURE OF HAND Left ~ 2013   PERIPHERAL VASCULAR BALLOON ANGIOPLASTY  09/09/2017   Procedure: PERIPHERAL VASCULAR BALLOON ANGIOPLASTY;  Surgeon: Serafina Mitchell, MD;  Location: [facility];  Service: Cardiovascular;;  Celiac instent   PERIPHERAL VASCULAR INTERVENTION  01/14/2017   Procedure: Peripheral Vascular Intervention;  Surgeon: Serafina Mitchell, MD;  Location: [facility];  Service: Cardiovascular;;  mesentric   RENAL ARTERY STENT Left 08/2012   @ [facility] - 6.0 mm x 15 mm   SPINE SURGERY     tumor removed 07/2010; Redo Surgery 04/2012; Sacroiliac Sgx 08/2013   TOTAL ABDOMINAL HYSTERECTOMY     TOTAL KNEE ARTHROPLASTY Left ~ 2012   VISCERAL ANGIOGRAM N/A 05/12/2014   Procedure: VISCERAL ANGIOGRAM;  Surgeon: Lorretta Harp, MD;  Location: [facility] CATH LAB;  25% ostial proximal celiac artery with downward takeoff.  23% proximal SMA and 56% proximal IMA.  Left renal artery stent widely patent.  Right renal artery is 20 to 30% proximal stenosis.   VISCERAL ANGIOGRAPHY N/A 01/14/2017   Procedure: Mesenteric  Angiography;  Surgeon: Serafina Mitchell, MD;  Location: [place] [facility];  Service: Cardiovascular;  Laterality: N/A;   VISCERAL ANGIOGRAPHY N/A 09/09/2017   Procedure: VISCERAL ANGIOGRAPHY;  Surgeon: Serafina Mitchell, MD;  Location: [facility];  Service: Cardiovascular;  Laterality: N/A;    Immunization History  Administered Date(s)  Administered   Tdap 03/19/2012   Zoster, Live 03/31/2009    MEDICATIONS/ALLERGIES   Current Meds  Medication Sig   acetaminophen (TYLENOL) 500 MG tablet Take 2,000 mg by mouth 4 (four) times daily as needed for moderate pain.    Artificial Tear Solution (TEARS RENEWED OP) Apply 1 drop to eye daily as needed (dry eyes).   carvedilol (COREG) 6.25 MG tablet TAKE 1 TABLET BY MOUTH TWICE DAILY WITH A MEAL. MAY TAKE AN EXTRA TABLET DAILY IF HAVING PALPATIONS   clopidogrel (PLAVIX) 75 MG tablet TAKE 1 TABLET(75 MG) BY MOUTH DAILY   cyanocobalamin (,VITAMIN B-12,) 1000 MCG/ML injection Inject into the muscle.   diclofenac sodium (VOLTAREN) 1 % GEL Apply small amount sparingly to the right and left footl   furosemide (LASIX) 80 MG tablet TAKE 1 TABLET(80 MG) by once a day , may take an additional 80 mg if needed for swelling   indapamide (LOZOL) 2.5 MG tablet TAKE ONE TABLET BY MOUTH DAILY, 30 MINUTES BEFORE DAILY FUROSEMIDE.   isosorbide  mononitrate (IMDUR) 60 MG 24 hr tablet Take 1 tablet (60 mg total) by mouth daily.   lidocaine (XYLOCAINE) 5 % ointment Apply small amount to the bottom of the right foot 2-3 times per day   Lidocaine 4 % PTCH Apply 1 patch topically daily as needed (pain).   losartan (COZAAR) 50 MG tablet TAKE 1 TABLET EVERY DAY   nitroGLYCERIN (NITROSTAT) 0.4 MG SL tablet DISSOLVE 1 TABLET UNDER THE TONGUE EVERY 5 MINUTES AS NEEDED FOR CHEST PAIN   spironolactone (ALDACTONE) 25 MG tablet TAKE 1 TABLET BY MOUTH AS DIRECTED. TAKE 1 TABLET ON MONDAYS, WEDNESDAYS, AND FRIDAYS AT 6 PM.   [DISCONTINUED] isosorbide mononitrate (IMDUR) 30 MG 24 hr tablet Take 1 tablet (30 mg total) by mouth daily.   [DISCONTINUED] lidocaine (XYLOCAINE) 5 % ointment Apply 1 application topically at bedtime as needed (toe pain).     Allergies  Allergen Reactions   Codeine Anaphylaxis   Codeine Phosphate Anaphylaxis, Shortness Of Breath and Swelling   Contrast Media [Iodinated Diagnostic Agents]      swelling   Penicillin G Anaphylaxis   Rosuvastatin Calcium Other (See Comments)    Intolerant   Iodine Swelling and Rash    REACTION: unspecified This allergy is to Topical iodine only.    Prednisone Itching and Rash    Keeps pt up all night    Statins Other (See Comments)    Intolerant  Intolerant   Tramadol Other (See Comments) and Palpitations    Kept patient awake Tramadol "makes me hyper".    SOCIAL HISTORY/FAMILY HISTORY   Reviewed in Epic:  Pertinent findings:  Social History   Tobacco Use   Smoking status: Never   Smokeless tobacco: Never  Vaping Use   Vaping Use: Never used  Substance Use Topics   Alcohol use: No   Drug use: No   Social History   Social History Narrative   Married mother of one, one grandchild.   Very socially active, enjoys cooking and having get-togethers her house. She had been exercising regularly but her back pain limits her.   Does not smoke, does not drink alcohol.    OBJCTIVE -PE, EKG, labs   Wt Readings from Last 3 Encounters:  03/22/21 199 lb 6.4 oz (90.4 kg)  12/11/20 199 lb (90.3 kg)  10/10/20 193 lb 3.2 oz (87.6 kg)    Physical Exam: BP 132/68   Pulse 72   Ht 5\' 6"  (1.676 m)   Wt 199 lb 6.4 oz (90.4 kg)   SpO2 96%   BMI 32.18 kg/m  Physical Exam Vitals reviewed.  Constitutional:      General: She is not in acute distress.    Appearance: Normal appearance. She is obese. She is not ill-appearing or toxic-appearing.     Comments: Well-groomed.  Well-nourished  HENT:     Head: Normocephalic and atraumatic.  Neck:     Vascular: No carotid bruit.  Cardiovascular:     Rate and Rhythm: Normal rate and regular rhythm. Occasional Extrasystoles are present.    Chest Wall: PMI is not displaced.     Pulses: Normal pulses.     Heart sounds: S1 normal and S2 normal. Heart sounds are distant. Murmur heard.  High-pitched harsh crescendo-decrescendo early systolic murmur is present with a grade of 1/6 at the upper right  sternal border radiating to the neck.    No friction rub. Gallop present. S4 sounds present.  Pulmonary:     Effort: Pulmonary effort is normal. No  respiratory distress.     Breath sounds: Normal breath sounds. No wheezing, rhonchi or rales.  Chest:     Chest wall: Tenderness present.  Musculoskeletal:     Cervical back: Normal range of motion and neck supple.     Right lower leg: Edema (1-2+ pitting) present.     Left lower leg: Edema (1+ 2+ pitting) present.  Neurological:     General: No focal deficit present.     Mental Status: She is alert and oriented to person, place, and time.  Psychiatric:        Mood and Affect: Mood normal.        Behavior: Behavior normal.        Thought Content: Thought content normal.        Judgment: Judgment normal.     Adult ECG Report  Rate: 72 ;  Rhythm: normal sinus rhythm and nonspecific ST and T wave changes. ; Otherwise normal axis, intervals and durations.  Narrative Interpretation: Normal EKG  Recent Labs: No recent labs. Lab Results  Component Value Date   CHOL 245 (H) 09/08/2019   HDL 36 (L) 09/08/2019   LDLCALC 152 (H) 09/08/2019   TRIG 305 (H) 09/08/2019   CHOLHDL 6.8 (H) 09/08/2019   Lab Results  Component Value Date   CREATININE 0.92 09/08/2019   BUN 26 09/08/2019   NA 133 (L) 09/08/2019   K 4.2 09/08/2019   CL 91 (L) 09/08/2019   CO2 26 09/08/2019   CBC Latest Ref Rng & Units 09/10/2017 09/09/2017 05/27/2017  WBC 4.0 - 10.5 K/uL 7.8 - 7.3  Hemoglobin 12.0 - 15.0 g/dL 10.9(L) 11.6(L) 12.7  Hematocrit 36.0 - 46.0 % 33.6(L) 34.0(L) 36.5  Platelets 150 - 400 K/uL 261 - 259    Lab Results  Component Value Date   TSH 2.40 10/24/2014    ==================================================  COVID-19 Education: The signs and symptoms of COVID-19 were discussed with the patient and how to seek care for testing (follow up with PCP or arrange E-visit).    I spent a total of 24 minutes with the patient spent in direct patient  consultation.  Additional time spent with chart review  / charting (studies, outside notes, etc): 18 min Total Time: 42 min  Current medicines are reviewed at length with the patient today.  (+/- concerns) n/a  This visit occurred during the SARS-CoV-2 public health emergency.  Safety protocols were in place, including screening questions prior to the visit, additional usage of staff PPE, and extensive cleaning of exam room while observing appropriate contact time as indicated for disinfecting solutions.  Notice: This dictation was prepared with Dragon dictation along with smaller phrase technology. Any transcriptional errors that result from this process are unintentional and may not be corrected upon review.  Patient Instructions / Medication Changes & Studies & Tests Ordered   Patient Instructions  Medication Instructions:   Increase Imdur  ( isosorbide mono ) 60 mg take at bedtime  *If you need a refill on your cardiac medications before your next appointment, please call your pharmacy*   Lab Work:  Not needed   Testing/Procedures: Will be mailed to you  Your physician has recommended that you wear a holter monitor 14 day ZIO . Holter monitors are medical devices that record the heart's electrical activity. Doctors most often use these monitors to diagnose arrhythmias. Arrhythmias are problems with the speed or rhythm of the heartbeat. The monitor is a small, portable device. You can wear one while you  do your normal daily activities. This is usually used to diagnose what is causing palpitations/syncope (passing out).   Follow-Up: At [facility], you and your health needs are our priority.  As part of our continuing mission to provide you with exceptional heart care, we have created designated Provider Care Teams.  These Care Teams include your primary Cardiologist (physician) and Advanced Practice Providers (APPs -  Physician Assistants and Nurse Practitioners) who all work  together to provide you with the care you need, when you need it.     Your next appointment:   3 month(s)  The format for your next appointment:   In Person  Provider:   Dr Ellyn Hack   Other Instructions   [place] Monitor Instructions  Your physician has requested you wear a ZIO patch monitor for 14 days.  This is a single patch monitor. [employer] supplies one patch monitor per enrollment. Additional stickers are not available. Please do not apply patch if you will be having a Nuclear Stress Test,  Echocardiogram, Cardiac CT, MRI, or Chest Xray during the period you would be wearing the  monitor. The patch cannot be worn during these tests. You cannot remove and re-apply the  ZIO XT patch monitor.  Your ZIO patch monitor will be mailed 3 day USPS to your address on file. It may take 3-5 days  to receive your monitor after you have been enrolled.  Once you have received your monitor, please review the enclosed instructions. Your monitor  has already been registered assigning a specific monitor serial # to you.  Billing and Patient Assistance Program Information  We have supplied [employer] with any of your insurance information on file for billing purposes. [employer] offers a sliding scale Patient Assistance Program for patients that do not have  insurance, or whose insurance does not completely cover the cost of the ZIO monitor.  You must apply for the Patient Assistance Program to qualify for this discounted rate.  To apply, please call [employer] at [phone], select option 4, select option 2, ask to apply for  Patient Assistance Program. Theodore Demark will ask your household income, and how many people  are in your household. They will quote your out-of-pocket cost based on that information.  [employer] will also be able to set up a 24-month, interest-free payment plan if needed.  Applying the monitor   Shave hair from upper left chest.  Hold abrader disc by orange tab. Rub  abrader in 40 strokes over the upper left chest as  indicated in your monitor instructions.  Clean area with 4 enclosed alcohol pads. Let dry.  Apply patch as indicated in monitor instructions. Patch will be placed under collarbone on left  side of chest with arrow pointing upward.  Rub patch adhesive wings for 2 minutes. Remove white label marked "1". Remove the white  label marked "2". Rub patch adhesive wings for 2 additional minutes.  While looking in a mirror, press and release button in center of patch. A small green light will  flash 3-4 times. This will be your only indicator that the monitor has been turned on.  Do not shower for the first 24 hours. You may shower after the first 24 hours.  Press the button if you feel a symptom. You will hear a small click. Record Date, Time and  Symptom in the Patient Logbook.  When you are ready to remove the patch, follow instructions on the last 2 pages of Patient  Logbook. Stick  patch monitor onto the last page of Patient Logbook.  Place Patient Logbook in the blue and white box. Use locking tab on box and tape box closed  securely. The blue and white box has prepaid postage on it. Please place it in the mailbox as  soon as possible. Your physician should have your test results approximately 7 days after the  monitor has been mailed back to [facility].  Call [place] at [phone] if you have questions regarding  your ZIO XT patch monitor. Call them immediately if you see an orange light blinking on your  monitor.  If your monitor falls off in less than 4 days, contact our Monitor department at [phone].  If your monitor becomes loose or falls off after 4 days call [employer] at [phone] for  suggestions on securing your monitor    Studies Ordered:   Orders Placed This Encounter  Procedures   LONG TERM MONITOR (3-14 DAYS)   EKG 12-Lead     Glenetta Hew, M.D., M.S. Interventional Cardiologist    Pager # [phone] Phone # [phone] [address] [place], [place] [address]   Thank you for choosing [facility] at [facility]!!

## 2021-03-22 NOTE — Patient Instructions (Signed)
Medication Instructions:   Increase Imdur  ( isosorbide mono ) 60 mg take at bedtime  *If you need a refill on your cardiac medications before your next appointment, please call your pharmacy*   Lab Work:  Not needed   Testing/Procedures: Will be mailed to you  Your physician has recommended that you wear a holter monitor 14 day ZIO . Holter monitors are medical devices that record the heart's electrical activity. Doctors most often use these monitors to diagnose arrhythmias. Arrhythmias are problems with the speed or rhythm of the heartbeat. The monitor is a small, portable device. You can wear one while you do your normal daily activities. This is usually used to diagnose what is causing palpitations/syncope (passing out).   Follow-Up: At [facility], you and your health needs are our priority.  As part of our continuing mission to provide you with exceptional heart care, we have created designated Provider Care Teams.  These Care Teams include your primary Cardiologist (physician) and Advanced Practice Providers (APPs -  Physician Assistants and Nurse Practitioners) who all work together to provide you with the care you need, when you need it.     Your next appointment:   3 month(s)  The format for your next appointment:   In Person  Provider:   Dr Ellyn Hack   Other Instructions   [place] Monitor Instructions  Your physician has requested you wear a ZIO patch monitor for 14 days.  This is a single patch monitor. [employer] supplies one patch monitor per enrollment. Additional stickers are not available. Please do not apply patch if you will be having a Nuclear Stress Test,  Echocardiogram, Cardiac CT, MRI, or Chest Xray during the period you would be wearing the  monitor. The patch cannot be worn during these tests. You cannot remove and re-apply the  ZIO XT patch monitor.  Your ZIO patch monitor will be mailed 3 day USPS to your address on file. It may take 3-5  days  to receive your monitor after you have been enrolled.  Once you have received your monitor, please review the enclosed instructions. Your monitor  has already been registered assigning a specific monitor serial # to you.  Billing and Patient Assistance Program Information  We have supplied [employer] with any of your insurance information on file for billing purposes. [employer] offers a sliding scale Patient Assistance Program for patients that do not have  insurance, or whose insurance does not completely cover the cost of the ZIO monitor.  You must apply for the Patient Assistance Program to qualify for this discounted rate.  To apply, please call [employer] at [phone], select option 4, select option 2, ask to apply for  Patient Assistance Program. Theodore Demark will ask your household income, and how many people  are in your household. They will quote your out-of-pocket cost based on that information.  [employer] will also be able to set up a 54-month, interest-free payment plan if needed.  Applying the monitor   Shave hair from upper left chest.  Hold abrader disc by orange tab. Rub abrader in 40 strokes over the upper left chest as  indicated in your monitor instructions.  Clean area with 4 enclosed alcohol pads. Let dry.  Apply patch as indicated in monitor instructions. Patch will be placed under collarbone on left  side of chest with arrow pointing upward.  Rub patch adhesive wings for 2 minutes. Remove white label marked "1". Remove the white  label marked "2".  Rub patch adhesive wings for 2 additional minutes.  While looking in a mirror, press and release button in center of patch. A small green light will  flash 3-4 times. This will be your only indicator that the monitor has been turned on.  Do not shower for the first 24 hours. You may shower after the first 24 hours.  Press the button if you feel a symptom. You will hear a small click. Record Date, Time and  Symptom in the  Patient Logbook.  When you are ready to remove the patch, follow instructions on the last 2 pages of Patient  Logbook. Stick patch monitor onto the last page of Patient Logbook.  Place Patient Logbook in the blue and white box. Use locking tab on box and tape box closed  securely. The blue and white box has prepaid postage on it. Please place it in the mailbox as  soon as possible. Your physician should have your test results approximately 7 days after the  monitor has been mailed back to [facility].  Call [place] at [phone] if you have questions regarding  your ZIO XT patch monitor. Call them immediately if you see an orange light blinking on your  monitor.  If your monitor falls off in less than 4 days, contact our Monitor department at [phone].  If your monitor becomes loose or falls off after 4 days call [employer] at [phone] for  suggestions on securing your monitor

## 2021-03-22 NOTE — Progress Notes (Unsigned)
Enrolled patient for a 14 day Zio XT Monitor to be mailed to patients home  

## 2021-03-26 ENCOUNTER — Encounter: Payer: Self-pay | Admitting: Cardiology

## 2021-03-26 NOTE — Assessment & Plan Note (Signed)
She does need to get back down below 190.  We discussed importance of a diet modification and increasing exercise level.  She is always on the go, but needs to do some walking.

## 2021-03-26 NOTE — Assessment & Plan Note (Signed)
Frequent episodes of intermittent chest pain have been evaluated on multiple occasions with either Myoview stress test or relook cath.  This point I do not think we need any further testing.  Plan:  Continue current dose of carvedilol and increase Imdur dose to 60 mg.  Continue losartan and spironolactone along with clopidogrel.  Not on statin because of intolerance/myalgias.

## 2021-03-26 NOTE — Assessment & Plan Note (Signed)
Overlapping LAD stents as well as PCI of the RCA.  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  Plan:  Continue maintenance clopidogrel 70 mg daily, but okay to hold 5 days preop for surgeries or procedures.  (7 days for high risk procedures)

## 2021-03-26 NOTE — Assessment & Plan Note (Signed)
ContinueAs much related to venous stasis as it is to HFpEF.  Commend foot elevation.  She is not currently wearing support stockings, but will do so in the wintertime.  Discussed use of indapamide and Lasix with additional doses as needed.  She is reluctant to take additional doses because it makes her urinate too much.

## 2021-03-26 NOTE — Assessment & Plan Note (Signed)
Now complaining of more frequent palpitations.  She does have a history of PAF that was perioperatively and no prior breakthroughs.  Since she is somewhat symptomatic at this point, will order Zio patch monitor. Continue beta-blocker for now.

## 2021-03-26 NOTE — Assessment & Plan Note (Signed)
Probably related to deconditioning.  Normal EF on echo and nonischemic Myoview last year.  Similar symptoms.

## 2021-03-26 NOTE — Assessment & Plan Note (Signed)
Blood pressure seems to be pretty well controlled on current dose of carvedilol, losartan and spironolactone along with indapamide and Lasix

## 2021-03-26 NOTE — Assessment & Plan Note (Signed)
See dyslipidemia

## 2021-03-26 NOTE — Assessment & Plan Note (Signed)
She continues to have this intermittent musculoskeletal pain which she continues to think is her angina.  I suggested trying nitroglycerin, and if that does not help, using Tylenol and Motrin.

## 2021-03-26 NOTE — Assessment & Plan Note (Addendum)
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  I do not think she truly had symptoms with some of them.  But she will not take them.  She has tried atorvastatin, rosuvastatin, simvastatin and pravastatin all of which caused side effects of myalgias and arthralgias. Similar symptoms are noted with ezetimibe.  She really does not want to discuss.  But she does understand that we need to recheck labs soon.  Can order fasting lipids and chemistry panel if not ordered by PCP by next visit.  I still think Inclisiran may be a potential option.

## 2021-03-26 NOTE — Assessment & Plan Note (Signed)
Chronic episodic chest pain that is not necessarily exertional.  She continues to think that she is having angina, but this has been evaluated with multiple different tests including neither cath.  Most recent Myoview last year showed no evidence of ischemia or infarction.  On the off chance that there may be some microvascular disease, will increase Imdur dose to 60 mg.  Since she is now complaining of palpitations, will check Zio patch for 7 days.

## 2021-04-04 DIAGNOSIS — R002 Palpitations: Secondary | ICD-10-CM | POA: Diagnosis not present

## 2021-04-24 DIAGNOSIS — I5030 Unspecified diastolic (congestive) heart failure: Secondary | ICD-10-CM | POA: Diagnosis not present

## 2021-04-24 DIAGNOSIS — I2510 Atherosclerotic heart disease of native coronary artery without angina pectoris: Secondary | ICD-10-CM | POA: Diagnosis not present

## 2021-04-24 DIAGNOSIS — I25119 Atherosclerotic heart disease of native coronary artery with unspecified angina pectoris: Secondary | ICD-10-CM | POA: Diagnosis not present

## 2021-04-24 DIAGNOSIS — Z9861 Coronary angioplasty status: Secondary | ICD-10-CM | POA: Diagnosis not present

## 2021-05-11 DIAGNOSIS — M5414 Radiculopathy, thoracic region: Secondary | ICD-10-CM | POA: Diagnosis not present

## 2021-05-11 DIAGNOSIS — M7552 Bursitis of left shoulder: Secondary | ICD-10-CM | POA: Diagnosis not present

## 2021-05-25 ENCOUNTER — Other Ambulatory Visit: Payer: Self-pay | Admitting: Cardiology

## 2021-05-28 DIAGNOSIS — M5414 Radiculopathy, thoracic region: Secondary | ICD-10-CM | POA: Diagnosis not present

## 2021-06-19 DIAGNOSIS — M7552 Bursitis of left shoulder: Secondary | ICD-10-CM | POA: Diagnosis not present

## 2021-06-19 DIAGNOSIS — M5414 Radiculopathy, thoracic region: Secondary | ICD-10-CM | POA: Diagnosis not present

## 2021-06-19 DIAGNOSIS — M791 Myalgia, unspecified site: Secondary | ICD-10-CM | POA: Diagnosis not present

## 2021-06-20 ENCOUNTER — Ambulatory Visit (INDEPENDENT_AMBULATORY_CARE_PROVIDER_SITE_OTHER): Payer: Medicare Other | Admitting: Cardiology

## 2021-06-20 ENCOUNTER — Encounter: Payer: Self-pay | Admitting: Cardiology

## 2021-06-20 ENCOUNTER — Other Ambulatory Visit: Payer: Self-pay

## 2021-06-20 VITALS — BP 143/71 | HR 66 | Ht 67.5 in | Wt 200.4 lb

## 2021-06-20 DIAGNOSIS — I701 Atherosclerosis of renal artery: Secondary | ICD-10-CM

## 2021-06-20 DIAGNOSIS — R002 Palpitations: Secondary | ICD-10-CM | POA: Diagnosis not present

## 2021-06-20 DIAGNOSIS — T466X5D Adverse effect of antihyperlipidemic and antiarteriosclerotic drugs, subsequent encounter: Secondary | ICD-10-CM

## 2021-06-20 DIAGNOSIS — E785 Hyperlipidemia, unspecified: Secondary | ICD-10-CM

## 2021-06-20 DIAGNOSIS — E1169 Type 2 diabetes mellitus with other specified complication: Secondary | ICD-10-CM | POA: Diagnosis not present

## 2021-06-20 DIAGNOSIS — Z9861 Coronary angioplasty status: Secondary | ICD-10-CM

## 2021-06-20 DIAGNOSIS — I10 Essential (primary) hypertension: Secondary | ICD-10-CM

## 2021-06-20 DIAGNOSIS — I5030 Unspecified diastolic (congestive) heart failure: Secondary | ICD-10-CM | POA: Diagnosis not present

## 2021-06-20 DIAGNOSIS — I441 Atrioventricular block, second degree: Secondary | ICD-10-CM | POA: Diagnosis not present

## 2021-06-20 DIAGNOSIS — I25119 Atherosclerotic heart disease of native coronary artery with unspecified angina pectoris: Secondary | ICD-10-CM | POA: Diagnosis not present

## 2021-06-20 DIAGNOSIS — T466X5A Adverse effect of antihyperlipidemic and antiarteriosclerotic drugs, initial encounter: Secondary | ICD-10-CM

## 2021-06-20 DIAGNOSIS — I471 Supraventricular tachycardia: Secondary | ICD-10-CM | POA: Diagnosis not present

## 2021-06-20 DIAGNOSIS — I2510 Atherosclerotic heart disease of native coronary artery without angina pectoris: Secondary | ICD-10-CM | POA: Diagnosis not present

## 2021-06-20 DIAGNOSIS — R55 Syncope and collapse: Secondary | ICD-10-CM

## 2021-06-20 DIAGNOSIS — M791 Myalgia, unspecified site: Secondary | ICD-10-CM

## 2021-06-20 DIAGNOSIS — I4719 Other supraventricular tachycardia: Secondary | ICD-10-CM | POA: Insufficient documentation

## 2021-06-20 NOTE — Patient Instructions (Addendum)
Medication Instructions:   No changes  *If you need a refill on your cardiac medications before your next appointment, please call your pharmacy*   Lab Work: BMP CBC If you have labs (blood work) drawn today and your tests are completely normal, you will receive your results only by: [place] (if you have MyChart) OR A paper copy in the mail If you have any lab test that is abnormal or we need to change your treatment, we will call you to review the results.   Testing/Procedures: Will be schedule at [place] has recommended that you have a pacemaker inserted. A pacemaker is a small device that is placed under the skin of your chest or abdomen to help control abnormal heart rhythms. This device uses electrical pulses to prompt the heart to beat at a normal rate. Pacemakers are used to treat heart rhythms that are too slow. Wire (leads) are attached to the pacemaker that goes into the chambers of you heart. This is done in the hospital and usually requires and overnight stay. Please see the instruction sheet given to you today for more information.    Follow-Up: At [facility], you and your health needs are our priority.  As part of our continuing mission to provide you with exceptional heart care, we have created designated Provider Care Teams.  These Care Teams include your primary Cardiologist (physician) and Advanced Practice Providers (APPs -  Physician Assistants and Nurse Practitioners) who all work together to provide you with the care you need, when you need it.     Your next appointment:   2 month(s)  The format for your next appointment:   In Person  Provider:   Glenetta Hew, MD   Other [place] [facility] [address], [address]  [place], [place] [address]  Phone: [phone] Fax: [phone]   You are scheduled for a pacemaker Implant on Jul 13, 2021 with Dr. Sallyanne Kuster.    Please arrive at the [facility] at 11:30 am on the day of your procedure.   You do not need to be fasting.  You do not need pre-procedure lab work or testing.  Please bring your insurance cards and a list of your medications with you.  Wash your chest and neck with the surgical soap provided the evening before and the morning of your procedure. Please following the washing instructions provided.   There aren't any restrictions after the procedure. You will receive wound care instructions upon discharge.  If you have any questions after you get home, please call the office at [phone].   Thank you, Sanda Klein, MD   * Special note:  Every effort is made to have your procedure done on time.  Occasionally there are emergencies that present themselves at the hospital that may cause delays.  Please be patient if a delay does occur.  Preparing for Surgery  Before surgery, you can play an important role. Because skin is not sterile, your skin needs to be as free of germs as possible. You can reduce the number of germs on your skin by washing with CHG (chlorhexidine gluconate) Soap before surgery. CHG is an antiseptic cleaner which kills germs and bonds with the skin to continue killing germs even after washing.  Please do not use if you have an allergy to CHG or antibacterial soaps. If  your skin becomes reddened/irritated, STOP using the CHG.  DO NOT SHAVE (including legs and underarms) for at least 48 hours prior to first CHG shower. It is OK to shave your face.  Please follow these instructions carefully: Shower the night before surgery and the morning of surgery with CHG Soap. If you chose to wash your hair, wash your hair first as usual with your normal shampoo/conditioner. After you shampoo/condition, rinse you hair and body thoroughly to remove shampoo/conditioner. Use CHG as you would any other liquid soap. You can  apply CHG directly to the skin and wash gently with a loofah or a clean washcloth. Apply the CHG Soap to your body ONLY FROM THE NECK DOWN. Do not use on open wounds or open sores. Avoid contact with your eyes, ears, mouth, and genitals (private parts). Wash genitals (private part) with your normal soap. Wash thoroughly, paying special attention to the area where your surgery will be performed. Thoroughly rinse your body with warm water from the neck down. DO NOT shower/wash with your normal soap after using and rinsing off the CHG Soap. Pat yourself dry with a clean towel. Wear clean pajamas to bed. Place clean sheets on your bed the night of your first shower and do not sleep with pets..  Day of Surgery: Shower with the CHG Soap following the instructions listed above. DO NOT apply deodorants or lotions. Please wear clean clothes to the hospital/surgery center.   You may have a light breakfast before 8 am  and then nothing else to eat or drink until after procedure

## 2021-06-20 NOTE — Progress Notes (Signed)
Primary Care Provider: Kristopher Glee., MD Cardiologist: Glenetta Hew, MD Electrophysiologist: None  Clinic Note: No chief complaint on file.   ===================================  ASSESSMENT/PLAN   Problem List Items Addressed This Visit       Cardiology Problems   CAD S/P percutaneous coronary angioplasty: pRCA BMS, mLAD BMS overlapped prox with DES for ISR (Chronic)    Because of overlapping stents in the LAD as well as RCA stent and SMA stents, she is on maintenance dose Plavix which can be held for procedures or surgeries.      Congestive heart failure with LV diastolic dysfunction, NYHA class 2 (HCC) (Chronic)    Chronic HFpEF with mild edema.  I suspect edema is probably more venous stasis because she does not really have PND or orthopnea.  Continue current dose of diuretic.  She has not required any additional dosing.  She does not want to any tighter support stockings that she is wearing.      Hyperlipidemia associated with type 2 diabetes mellitus ([place]) (Chronic)    Intolerant to statins.  Refuses to take any lipid medications.  Understands the risks. We talked about the potential inclisiran, but she now is not interested.    Can discuss following pacemaker placement.        Mobitz II    Intermittent episodes of Mobitz 2 block along with frequent PAT episodes.  Reluctant to stop beta-blocker because of the PAT and prior PAF as well as history of significant CAD.  Discussed with Dr. Sallyanne Kuster.  Need for beta-blocker treatment with essentially tachybradycardia condition, class I indication for pacemaker.  PPM scheduled for November 11 with Dr. Sallyanne Kuster.      Relevant Orders   EKG 12-Lead (Completed)   Basic metabolic panel (Completed)   CBC (Completed)   Essential hypertension (Chronic)    Borderline elevated blood pressure today which is usually well controlled.  She is on stable dose carvedilol and losartan along with her diuretic regimen of furosemide,  indapamide and spironolactone.  We could titrate up losartan further, but would like to see consistent readings that are elevated.      Coronary artery disease involving native coronary artery of native heart with angina pectoris ([place]) - Primary (Chronic)    Still has frequent episodes of intermittent chest pain she says she was aching all over during part of the time between now and when I am seeing her, but it does not seem like anything is any different than she had had before.  We have increased her dose of Imdur in the past, not really sure if that made much of a difference.  She has had multiple ischemic evaluation since her last PCI all of which have been negative.  She could very well have microvascular disease, but I suspect a lot of this is psychosomatic.  Plan: Continue carvedilol and Imdur for antianginal benefit.  Continue losartan spironolactone as well as monotherapy with clopidogrel (this will need to be held for her PPM 5-7 days preop) Not willing to discuss lipid management.       PAT (paroxysmal atrial tachycardia) (HCC) (Chronic)    Significant amount of bursts of PAT in combination with high-grade AV block.  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  With her having CAD as well as history of PAF and now PAT, she needs to be on a beta-blocker which I would like to titrate up further, but we cannot at this point because of bradycardia.  Plan: Discussed with Dr. Sallyanne Kuster.  She will be scheduled for PPM placement      Relevant Orders   EKG 12-Lead (Completed)   Basic metabolic panel (Completed)   CBC (Completed)     Other   Palpitations   Relevant Orders   Basic metabolic panel (Completed)   CBC (Completed)   Syncope and collapse    Least 1 episode where she had if not full loss of consciousness partial loss of consciousness.  With high grade AV block noted on monitor and concern for possible tachybradycardia syndrome, I am concerned that her passout  spells could be related to bradycardia/high-grade block.  While initially we considered stopping the beta-blocker, she does have CAD, PAF and PAT which all need to be treated with beta-blocker.  This makes it a class I indication for pacemaker.  I have talked to Dr. Sallyanne Kuster who saw her today and they have scheduled for pacemaker placement on November 11.      Relevant Orders   EKG 12-Lead (Completed)   Basic metabolic panel (Completed)   CBC (Completed)   Myalgia due to statin (Chronic)    Not willing to take any other medications.  We can consider the possibility of inclisiran follow-up but suspect because the cost she would want to do it.       ===================================  HPI:    [name] is a 80 y.o. female with a PMH Notable for Two-Vessel CAD, PAD/likely artery disease, HFpEF with chronic lower extremity edema, PAF (without recurrence), chronic nonanginal chest pain who presents today for follow-up to discuss results of monitor ordered for the evaluation of palpitations and concern for possible recurrent A. fib..  Cardiac History CAD: pRCA & mLAD x 2 in 2011 (BMS stents used for preop back surgery),  Post-op back Sgx, had recurrent angina --> DES x 2 LAD for ISR Relook Cath 03/16/2015: Widely patent LAD stents with D2 jailed.  Minimal D2 stenosis.  Proximal to mid RCA BMS with less than 10% ISR.  OM1 and D1 60% stenosis.-Stable. Myoview 01/2020: EF 60-65%> No EKG changes. No Infarct or Ischemia.  LOW RISK PAD/Renal Artery/Splanchnic Artery Disease RAS - w/p Renal A Stent; Celiac & SMA stenosis - SMA stent HLD -complete refusal to take statins or other medications such as Nexletol/Zetia.  Has declined CVRR plans to start PCSK9 inhibitor, even potentially Inclisiran. Chronic Lower Extremity Edema with mild HFpEF -> currently on indapamide and furosemide combination along with spironolactone.  [name] was last seen on March 22, 2021.  She still noted  intermittent episodes of chest discomfort but but she was really complaining of whether is intermittent palpitations and dyspnea.  Noticing that her stamina is decreasing and some more exertional dyspnea.  Stable edema.  Has had some lightheadedness and dizziness.  Initially did not have any syncope or near syncope, but when I talked to her today she said that she had had an episode of syncope.  Recent Hospitalizations: None  Reviewed  CV studies:    The following studies were reviewed today: (if available, images/films reviewed: From Epic Chart or Care Everywhere) Zio patch monitor August 2022:  Monitor results shows predominantly sinus rhythm with a minimum heart rate of 53 bpm, and maximum heart rate of 123 bpm with an average of 82 mean  Baseline first-degree block was present. There is also 5 episodes of high-grade AV block in the second-degree with heart rates as low as 29 bpm. Longest episode was in 20 seconds.  Also noted frequent and 25)  episodes of paroxysmal atrial tachycardia/supraventricular tachycardia fastest being 6 beats at a rate of 182 bpmOn and longest being 15 1 4  seconds at a rate of 100 bpm.  Symptoms were noted in having the SVT episodes, but not necessarily associated with him having any long episodes.  Otherwise, rare isolated PACs and PVCs noted.   Interval History:   [name] returns here today for follow-up stating that while she was wearing the monitor, she did not really noticed that much in the way significant symptoms but this was returned and then she felt a lot worse.  Still felt like she was aching all over.  Very fatigued and tired, felt lots of palpitations and her husband indicates that she had 1 episode where she may have passed out.  Difficult to assess because she did not mention it.  She did not pass out with a sensation of palpitations, has not noted prolonged palpitations.  She still has intermittent chest pain off and on but that does not  seem to be stopping her from doing her extensive baking and cooking.  CV Review of Symptoms (Summary) Cardiovascular ROS: positive for - chest pain, dyspnea on exertion, edema, loss of consciousness, palpitations, rapid heart rate, and all of these are relatively stable with the exception of the one episode of syncope. negative for - orthopnea, paroxysmal nocturnal dyspnea, shortness of breath, or TIA or amaurosis fugax symptoms.  REVIEWED OF SYSTEMS   Review of Systems  Constitutional:  Positive for malaise/fatigue (Says that her energy level is down). Negative for weight loss.  HENT:  Negative for congestion and sinus pain.   Respiratory:  Positive for shortness of breath (Per HPI). Negative for cough.   Cardiovascular:        Per HPI  Gastrointestinal:  Negative for blood in stool and melena.  Genitourinary:  Negative for hematuria.  Musculoskeletal:  Positive for joint pain. Negative for falls and myalgias.  Neurological:  Positive for dizziness and loss of consciousness. Negative for weakness.  Psychiatric/Behavioral:  Negative for depression and memory loss. The patient is not nervous/anxious and does not have insomnia.    I have reviewed and (if needed) personally updated the patient's problem list, medications, allergies, past medical and surgical history, social and family history.   PAST MEDICAL HISTORY   Past Medical History:  Diagnosis Date   Arthritis    "fingers, right shoulder" (09/09/2017)   Atrial fibrillation (HCC)    Back pain with radiation    Chronic Back Pain - mutliple surgeries (including tumor removal)   Bilateral edema of lower extremity    Chronic, likely related to venous stasis   CAD S/P percutaneous coronary angioplasty    a) LHC: 07/30/10. -- 3.0x78mm Integrity BMS to pRCA & 2.5x 64mm Integrity BMS mLAD(@ D2).  b) Class III-IV Angina 03/2011: LHC- ISR in LAD BMS -- prox overlapping Promus DES 2.5x52mm and PTCA of jailed D2 ostium-prox 80%. c) 02/03/12:   LHC- patent stents.  Jailed diagonal. with stable flow; d) Peri-OP NSTEMI 04/2012 - LHC in 12/'13 -    Celiac artery stenosis ([place])    12/2016 - STENT placement   Complication of anesthesia    "used to wake up wild years ago" (09/09/2017)   Congestive heart failure with LV diastolic dysfunction, NYHA class 2 ([place])    06/13/10:  last 2D echo-  EF >55%, Mild TR, Mod Conc LVH - Grade 1 diastolic dysfunction (abnormal relaxation) --> LVEDP on Cath 28 mmHg & mild 2nd  Pulm HTN   Diet-controlled type 2 diabetes mellitus ([place])    Diverticulitis of colon (without mention of hemorrhage)(562.11)    Dyslipidemia, goal LDL below 70    Intolerant to statins   GERD (gastroesophageal reflux disease)    Hepatitis ~ 1957   "yellow jaundice" (01/14/2017)   Hepatomegaly    Hiatal hernia    History of blood transfusion 04/2012   "when I had a heart attack"   History of kidney stones    "I've got a stone embedded in one of my kidneys" (09/09/2017)   History of stomach ulcers "years ago"   Irritable bowel syndrome (IBS)    Labile essential hypertension    Partially related to RAS   Mesenteric artery stenosis (HCC)    95% Celiac Artery - ostial, 20-30% SMA.  Bilateral Renal A: L RA stent patent, R RA 20-30% -- Conservative Management   NSTEMI (non-ST elevated myocardial infarction) ([place]) 04/2012   Unclear the details, apparently this was postoperative from her back surgery that she was cleared for my last saw her in June.  Reportedly had stents placed   Pancreas divisum    On pancreatic enzyme   Renal artery stenosis ([place]) 2011; 12/'13   a) Angiogram 02/03/12:  50-60%L RA stenosis, 40% R R Inferior artery; b) 12/'13: S/P L RA Stent ([facility]) 6.0 mm x 15 mm; c) Renal Duplex 10/2013: <60% L RA, <60 R RA, ~60% SMA & Celiac A.   Stricture and stenosis of esophagus    Unspecified gastritis and gastroduodenitis without mention of hemorrhage     PAST SURGICAL HISTORY   Past Surgical History:  Procedure Laterality  Date   APPENDECTOMY     BACK SURGERY     BREAST DUCTAL SYSTEM EXCISION     right   BREAST SURGERY Right    CARDIAC CATHETERIZATION N/A 03/16/2015   Procedure: Left Heart Cath and Coronary Angiography;  Surgeon: Leonie Man, MD;  Location: [facility];  Service: Cardiovascular; Widely patent m-dLAD stents as wellas pRCA stent.  High LVEDP, small Diag & Om vessels with moderate stenosis    Cardiac Event Monitor  01/2017   Mostly NSR with occasional sinus tachycardia and rare bradycardia. No A. fib. No PND or PSVT. Rare PACs and PVCs.   CATARACT EXTRACTION W/ INTRAOCULAR LENS  IMPLANT, BILATERAL Bilateral    CHOLECYSTECTOMY OPEN     COLONOSCOPY  03/01/2009   normal    CORONARY ANGIOPLASTY WITH STENT PLACEMENT  07/30/2010   3.0x33mm Integrity BMS to RCA and 2.5x 83mm Integrity BMS LAD.     CORONARY ANGIOPLASTY WITH STENT PLACEMENT  03/18/2011   Cutting Balloon PTCA of D2 (jailed - 80% ostial stenosis), DES PCI of mid LAD ISR - > Promus DES 2.5 x 16 mm postdilated to 2.8 mm) covering the proximal portion of the previous stent   DOBUTAMINE STRESS ECHO  03/07/2015   [facility] (ordered for pre-op evaluation for EUS/ERCP --> abnormal EKG:  strreesss test shhoowwed 1 mm ST segment depressions downsloping. No wall motion abnormalities at peak exercise or at rest. Diastolic dysfunction was noted but normal systolic function - EF greater than 55%. No bouts of regurgitation or stenosis. Resting hypertension with exaggerated response   ESOPHAGOGASTRODUODENOSCOPY  04/08/2012   ESOPHAGOGASTRODUODENOSCOPY (EGD) WITH ESOPHAGEAL DILATION  X 2   FRACTURE SURGERY     HAMMER TOE SURGERY Bilateral    "took bone off the top of 2nd toe on each foot"   HIP SURGERY Left    "  something to do w/my back"   JOINT REPLACEMENT     KNEE SURGERY Left    "had fluid drained off it a couple times"   LEFT HEART CATH AND CORONARY ANGIOGRAPHY  07/30/2010   severe LAD-diagonal 80%, moderate to severe proximal RCA.  Mean PAP 15  mmHg.  PCWP mean 17 mmHg.  RVP 45/11 mmHg.  PAP 47/24 mmHg, mean 31 mmHg.  LVEDP 27 mmHg   LEFT HEART CATHETERIZATION WITH CORONARY ANGIOGRAM N/A 02/03/2012   Procedure: LEFT HEART CATHETERIZATION WITH CORONARY ANGIOGRAM;  Surgeon: Leonie Man, MD;  Location: [facility] CATH LAB; widely patent LAD and RCA stents.  Patent D2 ostium.  Moderate L Renal A stenosis (56%), R Renal A 40-50% t.  LVEDP 20 mmHg.   LEFT HEART CATHETERIZATION WITH CORONARY ANGIOGRAM N/A 05/12/2014   Procedure: LEFT HEART CATHETERIZATION WITH CORONARY ANGIOGRAM;  Surgeon: Lorretta Harp, MD;  Location: [facility] CATH LAB;  Service: Cardiovascular: Stable CAD. Patent stents. Patent renal artery stent   LEFT HEART CATHETERIZATION WITH CORONARY ANGIOGRAM  08/2012   Peri-Op MI @ [facility] -- 40% ostial D1, patent LAD stents, 10% RCA ISR   LEFT HEART CATHETERIZATION WITH CORONARY ANGIOGRAM   03/18/2011   70% ISR of LAD stent just after D2 (D2 has ostial 80 to 90% stenosis.  20 to 30% ISR RCA.  EDP elevated at 28 mmHg   NM MYOVIEW LTD  12/2016   LOW RISK study. No ischemia or infarction. EF 65-75%.   NM MYOVIEW LTD  01/05/2020    LOW RISK. EF 60-65%.  No EKG changes.  No infarct, no ischemia.   PANCREAS SURGERY     "stent in my pancreas"; Dr Pershing Proud   PERCUTANEOUS PINNING PHALANX FRACTURE OF HAND Left ~ 2013   PERIPHERAL VASCULAR BALLOON ANGIOPLASTY  09/09/2017   Procedure: PERIPHERAL VASCULAR BALLOON ANGIOPLASTY;  Surgeon: Serafina Mitchell, MD;  Location: [place] [facility];  Service: Cardiovascular;;  Celiac instent   PERIPHERAL VASCULAR INTERVENTION  01/14/2017   Procedure: Peripheral Vascular Intervention;  Surgeon: Serafina Mitchell, MD;  Location: [place] [facility];  Service: Cardiovascular;;  mesentric   RENAL ARTERY STENT Left 08/2012   @ [facility] - 6.0 mm x 15 mm   SPINE SURGERY     tumor removed 07/2010; Redo Surgery 04/2012; Sacroiliac Sgx 08/2013   TOTAL ABDOMINAL HYSTERECTOMY     TOTAL KNEE ARTHROPLASTY  Left ~ 2012   VISCERAL ANGIOGRAM N/A 05/12/2014   Procedure: VISCERAL ANGIOGRAM;  Surgeon: Lorretta Harp, MD;  Location: [facility] CATH LAB;  25% ostial proximal celiac artery with downward takeoff.  23% proximal SMA and 56% proximal IMA.  Left renal artery stent widely patent.  Right renal artery is 20 to 30% proximal stenosis.   VISCERAL ANGIOGRAPHY N/A 01/14/2017   Procedure: Mesenteric  Angiography;  Surgeon: Serafina Mitchell, MD;  Location: [place] [facility];  Service: Cardiovascular;  Laterality: N/A;   VISCERAL ANGIOGRAPHY N/A 09/09/2017   Procedure: VISCERAL ANGIOGRAPHY;  Surgeon: Serafina Mitchell, MD;  Location: [place] [facility];  Service: Cardiovascular;  Laterality: N/A;    Immunization History  Administered Date(s) Administered   Tdap 03/19/2012   Zoster, Live 03/31/2009    MEDICATIONS/ALLERGIES   Current Meds  Medication Sig   acetaminophen (TYLENOL) 500 MG tablet Take 2,000 mg by mouth 4 (four) times daily as needed for moderate pain.    Artificial Tear Solution (TEARS RENEWED OP) Apply 1 drop to  eye daily as needed (dry eyes).   carvedilol (COREG) 6.25 MG tablet TAKE 1 TABLET BY MOUTH TWICE DAILY WITH A MEAL. MAY TAKE AN EXTRA TABLET DAILY IF HAVING PALPATIONS   clopidogrel (PLAVIX) 75 MG tablet TAKE 1 TABLET(75 MG) BY MOUTH DAILY   cyanocobalamin (,VITAMIN B-12,) 1000 MCG/ML injection Inject into the muscle.   diclofenac sodium (VOLTAREN) 1 % GEL Apply small amount sparingly to the right and left footl   furosemide (LASIX) 80 MG tablet TAKE 1 TABLET(80 MG) by once a day , may take an additional 80 mg if needed for swelling   indapamide (LOZOL) 2.5 MG tablet TAKE ONE TABLET BY MOUTH DAILY, 30 MINUTES BEFORE DAILY FUROSEMIDE.   isosorbide mononitrate (IMDUR) 60 MG 24 hr tablet Take 1 tablet (60 mg total) by mouth daily.   lidocaine (XYLOCAINE) 5 % ointment Apply small amount to the bottom of the right foot 2-3 times per day   Lidocaine 4 % PTCH Apply 1 patch topically daily as  needed (pain).   losartan (COZAAR) 50 MG tablet TAKE 1 TABLET EVERY DAY   nitroGLYCERIN (NITROSTAT) 0.4 MG SL tablet DISSOLVE 1 TABLET UNDER THE TONGUE EVERY 5 MINUTES AS NEEDED FOR CHEST PAIN   spironolactone (ALDACTONE) 25 MG tablet TAKE 1 TABLET BY MOUTH AS DIRECTED. TAKE 1 TABLET ON MONDAYS, WEDNESDAYS, AND FRIDAYS AT 6 PM.    Allergies  Allergen Reactions   Codeine Anaphylaxis   Codeine Phosphate Anaphylaxis, Shortness Of Breath and Swelling   Contrast Media [Iodinated Diagnostic Agents]     swelling   Penicillin G Anaphylaxis   Rosuvastatin Calcium Other (See Comments)    Intolerant   Iodine Swelling and Rash    REACTION: unspecified This allergy is to Topical iodine only.    Prednisone Itching and Rash    Keeps pt up all night    Statins Other (See Comments)    Intolerant  Intolerant   Tramadol Other (See Comments) and Palpitations    Kept patient awake Tramadol "makes me hyper".    SOCIAL HISTORY/FAMILY HISTORY   Reviewed in Epic:  Pertinent findings:  Social History   Tobacco Use   Smoking status: Never   Smokeless tobacco: Never  Vaping Use   Vaping Use: Never used  Substance Use Topics   Alcohol use: No   Drug use: No   Social History   Social History Narrative   Married mother of one, one grandchild.   Very socially active, enjoys cooking and having get-togethers her house. She had been exercising regularly but her back pain limits her.   Does not smoke, does not drink alcohol.    OBJCTIVE -PE, EKG, labs   Wt Readings from Last 3 Encounters:  06/20/21 200 lb 6.4 oz (90.9 kg)  03/22/21 199 lb 6.4 oz (90.4 kg)  12/11/20 199 lb (90.3 kg)    Physical Exam: BP (!) 143/71   Pulse 66   Ht 5' 7.5" (1.715 m)   Wt 200 lb 6.4 oz (90.9 kg)   SpO2 98%   BMI 30.92 kg/m  Physical Exam Vitals reviewed.  Constitutional:      General: She is not in acute distress.    Appearance: Normal appearance. She is obese. She is not ill-appearing.  HENT:      Head: Normocephalic and atraumatic.  Neck:     Vascular: No carotid bruit or JVD.  Cardiovascular:     Rate and Rhythm: Normal rate and regular rhythm. Occasional Extrasystoles are present.  Chest Wall: PMI is not displaced.     Pulses: Intact distal pulses. Decreased pulses (Mildly decreased because of pedal edema).     Heart sounds: S1 normal and S2 normal. Heart sounds are distant. Murmur heard.  Harsh crescendo-decrescendo early systolic murmur is present with a grade of 1/6 at the upper right sternal border radiating to the neck.    No friction rub. No gallop.  Pulmonary:     Effort: Pulmonary effort is normal. No respiratory distress.     Breath sounds: Normal breath sounds.  Chest:     Chest wall: Tenderness present.  Musculoskeletal:        General: No swelling (1+ bilateral edema). Normal range of motion.     Cervical back: Normal range of motion and neck supple.  Skin:    General: Skin is warm and dry.  Neurological:     General: No focal deficit present.     Mental Status: She is alert and oriented to person, place, and time.  Psychiatric:        Mood and Affect: Mood normal.        Behavior: Behavior normal.        Thought Content: Thought content normal.        Judgment: Judgment normal.   Neurological:   Adult ECG Report N/A  Recent Labs:    Lab Results  Component Value Date   CHOL 245 (H) 09/08/2019   HDL 36 (L) 09/08/2019   LDLCALC 152 (H) 09/08/2019   TRIG 305 (H) 09/08/2019   CHOLHDL 6.8 (H) 09/08/2019   Lab Results  Component Value Date   CREATININE 0.92 09/08/2019   BUN 26 09/08/2019   NA 133 (L) 09/08/2019   K 4.2 09/08/2019   CL 91 (L) 09/08/2019   CO2 26 09/08/2019   CBC Latest Ref Rng & Units 09/10/2017 09/09/2017 05/27/2017  WBC 4.0 - 10.5 K/uL 7.8 - 7.3  Hemoglobin 12.0 - 15.0 g/dL 10.9(L) 11.6(L) 12.7  Hematocrit 36.0 - 46.0 % 33.6(L) 34.0(L) 36.5  Platelets 150 - 400 K/uL 261 - 259    Lab Results  Component Value Date   HGBA1C 6.0  09/01/2007   Lab Results  Component Value Date   TSH 2.40 10/24/2014    ==================================================  COVID-19 Education: The signs and symptoms of COVID-19 were discussed with the patient and how to seek care for testing (follow up with PCP or arrange E-visit).    I spent a total of 39 minutes with the patient spent in direct patient consultation.  Additional time spent with chart review  / charting (studies, outside notes, etc): 15 min Total Time: 54 min  Current medicines are reviewed at length with the patient today.  (+/- concerns) N/A  This visit occurred during the SARS-CoV-2 public health emergency.  Safety protocols were in place, including screening questions prior to the visit, additional usage of staff PPE, and extensive cleaning of exam room while observing appropriate contact time as indicated for disinfecting solutions.  Notice: This dictation was prepared with Dragon dictation along with smart phrase technology. Any transcriptional errors that result from this process are unintentional and may not be corrected upon review.  Patient Instructions / Medication Changes & Studies & Tests Ordered   Patient Instructions  Medication Instructions:   No changes  *If you need a refill on your cardiac medications before your next appointment, please call your pharmacy*   Lab Work: BMP CBC If you have labs (blood work) drawn today and  your tests are completely normal, you will receive your results only by: MyChart Message (if you have MyChart) OR A paper copy in the mail If you have any lab test that is abnormal or we need to change your treatment, we will call you to review the results.   Testing/Procedures: Will be schedule at [place] has recommended that you have a pacemaker inserted. A pacemaker is a small device that is placed under the skin of your chest or abdomen to help control abnormal heart rhythms. This  device uses electrical pulses to prompt the heart to beat at a normal rate. Pacemakers are used to treat heart rhythms that are too slow. Wire (leads) are attached to the pacemaker that goes into the chambers of you heart. This is done in the hospital and usually requires and overnight stay. Please see the instruction sheet given to you today for more information.    Follow-Up: At [facility], you and your health needs are our priority.  As part of our continuing mission to provide you with exceptional heart care, we have created designated Provider Care Teams.  These Care Teams include your primary Cardiologist (physician) and Advanced Practice Providers (APPs -  Physician Assistants and Nurse Practitioners) who all work together to provide you with the care you need, when you need it.     Your next appointment:   2 month(s)  The format for your next appointment:   In Person  Provider:   Glenetta Hew, MD   [facility], [place]  [place], [place] [address]  Phone: [phone] Fax: [phone]   You are scheduled for a pacemaker Implant on Jul 13, 2021 with Dr. Sallyanne Kuster.   Please arrive at the [place] [facility] at 11:30 am on the day of your procedure.    Studies Ordered:   Orders Placed This Encounter  Procedures   Basic metabolic panel   CBC   EKG 12-Lead      Glenetta Hew, M.D., M.S. Interventional Cardiologist   Pager # [phone] Phone # [phone] [address] [place], [place] [address]   Thank you for choosing [facility] at [facility]!!

## 2021-06-20 NOTE — H&P (View-Only) (Signed)
Primary Care Provider: Kristopher Glee., MD Cardiologist: Glenetta Hew, MD Electrophysiologist: None  Clinic Note: No chief complaint on file.   ===================================  ASSESSMENT/PLAN   Problem List Items Addressed This Visit       Cardiology Problems   CAD S/P percutaneous coronary angioplasty: pRCA BMS, mLAD BMS overlapped prox with DES for ISR (Chronic)    Because of overlapping stents in the LAD as well as RCA stent and SMA stents, she is on maintenance dose Plavix which can be held for procedures or surgeries.      Congestive heart failure with LV diastolic dysfunction, NYHA class 2 (HCC) (Chronic)    Chronic HFpEF with mild edema.  I suspect edema is probably more venous stasis because she does not really have PND or orthopnea.  Continue current dose of diuretic.  She has not required any additional dosing.  She does not want to any tighter support stockings that she is wearing.      Hyperlipidemia associated with type 2 diabetes mellitus ([place]) (Chronic)    Intolerant to statins.  Refuses to take any lipid medications.  Understands the risks. We talked about the potential inclisiran, but she now is not interested.    Can discuss following pacemaker placement.        Mobitz II    Intermittent episodes of Mobitz 2 block along with frequent PAT episodes.  Reluctant to stop beta-blocker because of the PAT and prior PAF as well as history of significant CAD.  Discussed with Dr. Sallyanne Kuster.  Need for beta-blocker treatment with essentially tachybradycardia condition, class I indication for pacemaker.  PPM scheduled for November 11 with Dr. Sallyanne Kuster.      Relevant Orders   EKG 12-Lead (Completed)   Basic metabolic panel (Completed)   CBC (Completed)   Essential hypertension (Chronic)    Borderline elevated blood pressure today which is usually well controlled.  She is on stable dose carvedilol and losartan along with her diuretic regimen of furosemide,  indapamide and spironolactone.  We could titrate up losartan further, but would like to see consistent readings that are elevated.      Coronary artery disease involving native coronary artery of native heart with angina pectoris ([place]) - Primary (Chronic)    Still has frequent episodes of intermittent chest pain she says she was aching all over during part of the time between now and when I am seeing her, but it does not seem like anything is any different than she had had before.  We have increased her dose of Imdur in the past, not really sure if that made much of a difference.  She has had multiple ischemic evaluation since her last PCI all of which have been negative.  She could very well have microvascular disease, but I suspect a lot of this is psychosomatic.  Plan: Continue carvedilol and Imdur for antianginal benefit.  Continue losartan spironolactone as well as monotherapy with clopidogrel (this will need to be held for her PPM 5-7 days preop) Not willing to discuss lipid management.       PAT (paroxysmal atrial tachycardia) (HCC) (Chronic)    Significant amount of bursts of PAT in combination with high-grade AV block.  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  With her having CAD as well as history of PAF and now PAT, she needs to be on a beta-blocker which I would like to titrate up further, but we cannot at this point because of bradycardia.  Plan: Discussed with Dr. Sallyanne Kuster.  She will be scheduled for PPM placement      Relevant Orders   EKG 12-Lead (Completed)   Basic metabolic panel (Completed)   CBC (Completed)     Other   Palpitations   Relevant Orders   Basic metabolic panel (Completed)   CBC (Completed)   Syncope and collapse    Least 1 episode where she had if not full loss of consciousness partial loss of consciousness.  With high grade AV block noted on monitor and concern for possible tachybradycardia syndrome, I am concerned that her passout  spells could be related to bradycardia/high-grade block.  While initially we considered stopping the beta-blocker, she does have CAD, PAF and PAT which all need to be treated with beta-blocker.  This makes it a class I indication for pacemaker.  I have talked to Dr. Sallyanne Kuster who saw her today and they have scheduled for pacemaker placement on November 11.      Relevant Orders   EKG 12-Lead (Completed)   Basic metabolic panel (Completed)   CBC (Completed)   Myalgia due to statin (Chronic)    Not willing to take any other medications.  We can consider the possibility of inclisiran follow-up but suspect because the cost she would want to do it.       ===================================  HPI:    [name] is a 80 y.o. female with a PMH Notable for Two-Vessel CAD, PAD/likely artery disease, HFpEF with chronic lower extremity edema, PAF (without recurrence), chronic nonanginal chest pain who presents today for follow-up to discuss results of monitor ordered for the evaluation of palpitations and concern for possible recurrent A. fib..  Cardiac History CAD: pRCA & mLAD x 2 in 2011 (BMS stents used for preop back surgery),  Post-op back Sgx, had recurrent angina --> DES x 2 LAD for ISR Relook Cath 03/16/2015: Widely patent LAD stents with D2 jailed.  Minimal D2 stenosis.  Proximal to mid RCA BMS with less than 10% ISR.  OM1 and D1 60% stenosis.-Stable. Myoview 01/2020: EF 60-65%> No EKG changes. No Infarct or Ischemia.  LOW RISK PAD/Renal Artery/Splanchnic Artery Disease RAS - w/p Renal A Stent; Celiac & SMA stenosis - SMA stent HLD -complete refusal to take statins or other medications such as Nexletol/Zetia.  Has declined CVRR plans to start PCSK9 inhibitor, even potentially Inclisiran. Chronic Lower Extremity Edema with mild HFpEF -> currently on indapamide and furosemide combination along with spironolactone.  ANIAYAH ALANIZ was last seen on March 22, 2021.  She still noted  intermittent episodes of chest discomfort but but she was really complaining of whether is intermittent palpitations and dyspnea.  Noticing that her stamina is decreasing and some more exertional dyspnea.  Stable edema.  Has had some lightheadedness and dizziness.  Initially did not have any syncope or near syncope, but when I talked to her today she said that she had had an episode of syncope.  Recent Hospitalizations: None  Reviewed  CV studies:    The following studies were reviewed today: (if available, images/films reviewed: From Epic Chart or Care Everywhere) Zio patch monitor August 2022:  Monitor results shows predominantly sinus rhythm with a minimum heart rate of 53 bpm, and maximum heart rate of 123 bpm with an average of 82 mean  Baseline first-degree block was present. There is also 5 episodes of high-grade AV block in the second-degree with heart rates as low as 29 bpm. Longest episode was in 20 seconds.  Also noted frequent and 25)  episodes of paroxysmal atrial tachycardia/supraventricular tachycardia fastest being 6 beats at a rate of 182 bpmOn and longest being 15 1 4  seconds at a rate of 100 bpm.  Symptoms were noted in having the SVT episodes, but not necessarily associated with him having any long episodes.  Otherwise, rare isolated PACs and PVCs noted.   Interval History:   ZYAIR RHEIN returns here today for follow-up stating that while she was wearing the monitor, she did not really noticed that much in the way significant symptoms but this was returned and then she felt a lot worse.  Still felt like she was aching all over.  Very fatigued and tired, felt lots of palpitations and her husband indicates that she had 1 episode where she may have passed out.  Difficult to assess because she did not mention it.  She did not pass out with a sensation of palpitations, has not noted prolonged palpitations.  She still has intermittent chest pain off and on but that does not  seem to be stopping her from doing her extensive baking and cooking.  CV Review of Symptoms (Summary) Cardiovascular ROS: positive for - chest pain, dyspnea on exertion, edema, loss of consciousness, palpitations, rapid heart rate, and all of these are relatively stable with the exception of the one episode of syncope. negative for - orthopnea, paroxysmal nocturnal dyspnea, shortness of breath, or TIA or amaurosis fugax symptoms.  REVIEWED OF SYSTEMS   Review of Systems  Constitutional:  Positive for malaise/fatigue (Says that her energy level is down). Negative for weight loss.  HENT:  Negative for congestion and sinus pain.   Respiratory:  Positive for shortness of breath (Per HPI). Negative for cough.   Cardiovascular:        Per HPI  Gastrointestinal:  Negative for blood in stool and melena.  Genitourinary:  Negative for hematuria.  Musculoskeletal:  Positive for joint pain. Negative for falls and myalgias.  Neurological:  Positive for dizziness and loss of consciousness. Negative for weakness.  Psychiatric/Behavioral:  Negative for depression and memory loss. The patient is not nervous/anxious and does not have insomnia.    I have reviewed and (if needed) personally updated the patient's problem list, medications, allergies, past medical and surgical history, social and family history.   PAST MEDICAL HISTORY   Past Medical History:  Diagnosis Date   Arthritis    "fingers, right shoulder" (09/09/2017)   Atrial fibrillation (HCC)    Back pain with radiation    Chronic Back Pain - mutliple surgeries (including tumor removal)   Bilateral edema of lower extremity    Chronic, likely related to venous stasis   CAD S/P percutaneous coronary angioplasty    a) LHC: 07/30/10. -- 3.0x37mm Integrity BMS to pRCA & 2.5x 1mm Integrity BMS mLAD(@ D2).  b) Class III-IV Angina 03/2011: LHC- ISR in LAD BMS -- prox overlapping Promus DES 2.5x92mm and PTCA of jailed D2 ostium-prox 80%. c) 02/03/12:   LHC- patent stents.  Jailed diagonal. with stable flow; d) Peri-OP NSTEMI 04/2012 - LHC in 12/'13 -    Celiac artery stenosis ([place])    12/2016 - STENT placement   Complication of anesthesia    "used to wake up wild years ago" (09/09/2017)   Congestive heart failure with LV diastolic dysfunction, NYHA class 2 ([place])    06/13/10:  last 2D echo-  EF >55%, Mild TR, Mod Conc LVH - Grade 1 diastolic dysfunction (abnormal relaxation) --> LVEDP on Cath 28 mmHg & mild 2nd  Pulm HTN   Diet-controlled type 2 diabetes mellitus ([place])    Diverticulitis of colon (without mention of hemorrhage)(562.11)    Dyslipidemia, goal LDL below 70    Intolerant to statins   GERD (gastroesophageal reflux disease)    Hepatitis ~ 1957   "yellow jaundice" (01/14/2017)   Hepatomegaly    Hiatal hernia    History of blood transfusion 04/2012   "when I had a heart attack"   History of kidney stones    "I've got a stone embedded in one of my kidneys" (09/09/2017)   History of stomach ulcers "years ago"   Irritable bowel syndrome (IBS)    Labile essential hypertension    Partially related to RAS   Mesenteric artery stenosis (HCC)    95% Celiac Artery - ostial, 20-30% SMA.  Bilateral Renal A: L RA stent patent, R RA 20-30% -- Conservative Management   NSTEMI (non-ST elevated myocardial infarction) ([place]) 04/2012   Unclear the details, apparently this was postoperative from her back surgery that she was cleared for my last saw her in June.  Reportedly had stents placed   Pancreas divisum    On pancreatic enzyme   Renal artery stenosis ([place]) 2011; 12/'13   a) Angiogram 02/03/12:  50-60%L RA stenosis, 40% R R Inferior artery; b) 12/'13: S/P L RA Stent ([facility]) 6.0 mm x 15 mm; c) Renal Duplex 10/2013: <60% L RA, <60 R RA, ~60% SMA & Celiac A.   Stricture and stenosis of esophagus    Unspecified gastritis and gastroduodenitis without mention of hemorrhage     PAST SURGICAL HISTORY   Past Surgical History:  Procedure Laterality  Date   APPENDECTOMY     BACK SURGERY     BREAST DUCTAL SYSTEM EXCISION     right   BREAST SURGERY Right    CARDIAC CATHETERIZATION N/A 03/16/2015   Procedure: Left Heart Cath and Coronary Angiography;  Surgeon: Leonie Man, MD;  Location: [facility];  Service: Cardiovascular; Widely patent m-dLAD stents as wellas pRCA stent.  High LVEDP, small Diag & Om vessels with moderate stenosis    Cardiac Event Monitor  01/2017   Mostly NSR with occasional sinus tachycardia and rare bradycardia. No A. fib. No PND or PSVT. Rare PACs and PVCs.   CATARACT EXTRACTION W/ INTRAOCULAR LENS  IMPLANT, BILATERAL Bilateral    CHOLECYSTECTOMY OPEN     COLONOSCOPY  03/01/2009   normal    CORONARY ANGIOPLASTY WITH STENT PLACEMENT  07/30/2010   3.0x74mm Integrity BMS to RCA and 2.5x 95mm Integrity BMS LAD.     CORONARY ANGIOPLASTY WITH STENT PLACEMENT  03/18/2011   Cutting Balloon PTCA of D2 (jailed - 80% ostial stenosis), DES PCI of mid LAD ISR - > Promus DES 2.5 x 16 mm postdilated to 2.8 mm) covering the proximal portion of the previous stent   DOBUTAMINE STRESS ECHO  03/07/2015   [facility] (ordered for pre-op evaluation for EUS/ERCP --> abnormal EKG:  strreesss test shhoowwed 1 mm ST segment depressions downsloping. No wall motion abnormalities at peak exercise or at rest. Diastolic dysfunction was noted but normal systolic function - EF greater than 55%. No bouts of regurgitation or stenosis. Resting hypertension with exaggerated response   ESOPHAGOGASTRODUODENOSCOPY  04/08/2012   ESOPHAGOGASTRODUODENOSCOPY (EGD) WITH ESOPHAGEAL DILATION  X 2   FRACTURE SURGERY     HAMMER TOE SURGERY Bilateral    "took bone off the top of 2nd toe on each foot"   HIP SURGERY Left    "  something to do w/my back"   JOINT REPLACEMENT     KNEE SURGERY Left    "had fluid drained off it a couple times"   LEFT HEART CATH AND CORONARY ANGIOGRAPHY  07/30/2010   severe LAD-diagonal 80%, moderate to severe proximal RCA.  Mean PAP 15  mmHg.  PCWP mean 17 mmHg.  RVP 45/11 mmHg.  PAP 47/24 mmHg, mean 31 mmHg.  LVEDP 27 mmHg   LEFT HEART CATHETERIZATION WITH CORONARY ANGIOGRAM N/A 02/03/2012   Procedure: LEFT HEART CATHETERIZATION WITH CORONARY ANGIOGRAM;  Surgeon: Leonie Man, MD;  Location: [facility] C[facility] LAB; widely patent LAD and RCA stents.  Patent D2 ostium.  Moderate L Renal A stenosis (56%), R Renal A 40-50% t.  LVEDP 20 mmHg.   LEFT HEART CATHETERIZATION WITH CORONARY ANGIOGRAM N/A 05/12/2014   Procedure: LEFT HEART CATHETERIZATION WITH CORONARY ANGIOGRAM;  Surgeon: Lorretta Harp, MD;  Location: [facility] CATH LAB;  Service: Cardiovascular: Stable CAD. Patent stents. Patent renal artery stent   LEFT HEART CATHETERIZATION WITH CORONARY ANGIOGRAM  08/2012   Peri-Op MI @ [facility] -- 40% ostial D1, patent LAD stents, 10% RCA ISR   LEFT HEART CATHETERIZATION WITH CORONARY ANGIOGRAM   03/18/2011   70% ISR of LAD stent just after D2 (D2 has ostial 80 to 90% stenosis.  20 to 30% ISR RCA.  EDP elevated at 28 mmHg   NM MYOVIEW LTD  12/2016   LOW RISK study. No ischemia or infarction. EF 65-75%.   NM MYOVIEW LTD  01/05/2020    LOW RISK. EF 60-65%.  No EKG changes.  No infarct, no ischemia.   PANCREAS SURGERY     "stent in my pancreas"; Dr Pershing Proud   PERCUTANEOUS PINNING PHALANX FRACTURE OF HAND Left ~ 2013   PERIPHERAL VASCULAR BALLOON ANGIOPLASTY  09/09/2017   Procedure: PERIPHERAL VASCULAR BALLOON ANGIOPLASTY;  Surgeon: Serafina Mitchell, MD;  Location: [place] [facility];  Service: Cardiovascular;;  Celiac instent   PERIPHERAL VASCULAR INTERVENTION  01/14/2017   Procedure: Peripheral Vascular Intervention;  Surgeon: Serafina Mitchell, MD;  Location: [place];  Service: Cardiovascular;;  mesentric   RENAL ARTERY STENT Left 08/2012   @ [facility] - 6.0 mm x 15 mm   SPINE SURGERY     tumor removed 07/2010; Redo Surgery 04/2012; Sacroiliac Sgx 08/2013   TOTAL ABDOMINAL HYSTERECTOMY     TOTAL KNEE ARTHROPLASTY  Left ~ 2012   VISCERAL ANGIOGRAM N/A 05/12/2014   Procedure: VISCERAL ANGIOGRAM;  Surgeon: Lorretta Harp, MD;  Location: [facility] CATH LAB;  25% ostial proximal celiac artery with downward takeoff.  23% proximal SMA and 56% proximal IMA.  Left renal artery stent widely patent.  Right renal artery is 20 to 30% proximal stenosis.   VISCERAL ANGIOGRAPHY N/A 01/14/2017   Procedure: Mesenteric  Angiography;  Surgeon: Serafina Mitchell, MD;  Location: [facility];  Service: Cardiovascular;  Laterality: N/A;   VISCERAL ANGIOGRAPHY N/A 09/09/2017   Procedure: VISCERAL ANGIOGRAPHY;  Surgeon: Serafina Mitchell, MD;  Location: [place];  Service: Cardiovascular;  Laterality: N/A;    Immunization History  Administered Date(s) Administered   Tdap 03/19/2012   Zoster, Live 03/31/2009    MEDICATIONS/ALLERGIES   Current Meds  Medication Sig   acetaminophen (TYLENOL) 500 MG tablet Take 2,000 mg by mouth 4 (four) times daily as needed for moderate pain.    Artificial Tear Solution (TEARS RENEWED OP) Apply 1 drop to  eye daily as needed (dry eyes).   carvedilol (COREG) 6.25 MG tablet TAKE 1 TABLET BY MOUTH TWICE DAILY WITH A MEAL. MAY TAKE AN EXTRA TABLET DAILY IF HAVING PALPATIONS   clopidogrel (PLAVIX) 75 MG tablet TAKE 1 TABLET(75 MG) BY MOUTH DAILY   cyanocobalamin (,VITAMIN B-12,) 1000 MCG/ML injection Inject into the muscle.   diclofenac sodium (VOLTAREN) 1 % GEL Apply small amount sparingly to the right and left footl   furosemide (LASIX) 80 MG tablet TAKE 1 TABLET(80 MG) by once a day , may take an additional 80 mg if needed for swelling   indapamide (LOZOL) 2.5 MG tablet TAKE ONE TABLET BY MOUTH DAILY, 30 MINUTES BEFORE DAILY FUROSEMIDE.   isosorbide mononitrate (IMDUR) 60 MG 24 hr tablet Take 1 tablet (60 mg total) by mouth daily.   lidocaine (XYLOCAINE) 5 % ointment Apply small amount to the bottom of the right foot 2-3 times per day   Lidocaine 4 % PTCH Apply 1 patch topically daily as  needed (pain).   losartan (COZAAR) 50 MG tablet TAKE 1 TABLET EVERY DAY   nitroGLYCERIN (NITROSTAT) 0.4 MG SL tablet DISSOLVE 1 TABLET UNDER THE TONGUE EVERY 5 MINUTES AS NEEDED FOR CHEST PAIN   spironolactone (ALDACTONE) 25 MG tablet TAKE 1 TABLET BY MOUTH AS DIRECTED. TAKE 1 TABLET ON MONDAYS, WEDNESDAYS, AND FRIDAYS AT 6 PM.    Allergies  Allergen Reactions   Codeine Anaphylaxis   Codeine Phosphate Anaphylaxis, Shortness Of Breath and Swelling   Contrast Media [Iodinated Diagnostic Agents]     swelling   Penicillin G Anaphylaxis   Rosuvastatin Calcium Other (See Comments)    Intolerant   Iodine Swelling and Rash    REACTION: unspecified This allergy is to Topical iodine only.    Prednisone Itching and Rash    Keeps pt up all night    Statins Other (See Comments)    Intolerant  Intolerant   Tramadol Other (See Comments) and Palpitations    Kept patient awake Tramadol "makes me hyper".    SOCIAL HISTORY/FAMILY HISTORY   Reviewed in Epic:  Pertinent findings:  Social History   Tobacco Use   Smoking status: Never   Smokeless tobacco: Never  Vaping Use   Vaping Use: Never used  Substance Use Topics   Alcohol use: No   Drug use: No   Social History   Social History Narrative   Married mother of one, one grandchild.   Very socially active, enjoys cooking and having get-togethers her house. She had been exercising regularly but her back pain limits her.   Does not smoke, does not drink alcohol.    OBJCTIVE -PE, EKG, labs   Wt Readings from Last 3 Encounters:  06/20/21 200 lb 6.4 oz (90.9 kg)  03/22/21 199 lb 6.4 oz (90.4 kg)  12/11/20 199 lb (90.3 kg)    Physical Exam: BP (!) 143/71   Pulse 66   Ht 5' 7.5" (1.715 m)   Wt 200 lb 6.4 oz (90.9 kg)   SpO2 98%   BMI 30.92 kg/m  Physical Exam Vitals reviewed.  Constitutional:      General: She is not in acute distress.    Appearance: Normal appearance. She is obese. She is not ill-appearing.  HENT:      Head: Normocephalic and atraumatic.  Neck:     Vascular: No carotid bruit or JVD.  Cardiovascular:     Rate and Rhythm: Normal rate and regular rhythm. Occasional Extrasystoles are present.  Chest Wall: PMI is not displaced.     Pulses: Intact distal pulses. Decreased pulses (Mildly decreased because of pedal edema).     Heart sounds: S1 normal and S2 normal. Heart sounds are distant. Murmur heard.  Harsh crescendo-decrescendo early systolic murmur is present with a grade of 1/6 at the upper right sternal border radiating to the neck.    No friction rub. No gallop.  Pulmonary:     Effort: Pulmonary effort is normal. No respiratory distress.     Breath sounds: Normal breath sounds.  Chest:     Chest wall: Tenderness present.  Musculoskeletal:        General: No swelling (1+ bilateral edema). Normal range of motion.     Cervical back: Normal range of motion and neck supple.  Skin:    General: Skin is warm and dry.  Neurological:     General: No focal deficit present.     Mental Status: She is alert and oriented to person, place, and time.  Psychiatric:        Mood and Affect: Mood normal.        Behavior: Behavior normal.        Thought Content: Thought content normal.        Judgment: Judgment normal.   Neurological:   Adult ECG Report N/A  Recent Labs:    Lab Results  Component Value Date   CHOL 245 (H) 09/08/2019   HDL 36 (L) 09/08/2019   LDLCALC 152 (H) 09/08/2019   TRIG 305 (H) 09/08/2019   CHOLHDL 6.8 (H) 09/08/2019   Lab Results  Component Value Date   CREATININE 0.92 09/08/2019   BUN 26 09/08/2019   NA 133 (L) 09/08/2019   K 4.2 09/08/2019   CL 91 (L) 09/08/2019   CO2 26 09/08/2019   CBC Latest Ref Rng & Units 09/10/2017 09/09/2017 05/27/2017  WBC 4.0 - 10.5 K/uL 7.8 - 7.3  Hemoglobin 12.0 - 15.0 g/dL 10.9(L) 11.6(L) 12.7  Hematocrit 36.0 - 46.0 % 33.6(L) 34.0(L) 36.5  Platelets 150 - 400 K/uL 261 - 259    Lab Results  Component Value Date   HGBA1C 6.0  09/01/2007   Lab Results  Component Value Date   TSH 2.40 10/24/2014    ==================================================  COVID-19 Education: The signs and symptoms of COVID-19 were discussed with the patient and how to seek care for testing (follow up with PCP or arrange E-visit).    I spent a total of 39 minutes with the patient spent in direct patient consultation.  Additional time spent with chart review  / charting (studies, outside notes, etc): 15 min Total Time: 54 min  Current medicines are reviewed at length with the patient today.  (+/- concerns) N/A  This visit occurred during the SARS-CoV-2 public health emergency.  Safety protocols were in place, including screening questions prior to the visit, additional usage of staff PPE, and extensive cleaning of exam room while observing appropriate contact time as indicated for disinfecting solutions.  Notice: This dictation was prepared with Dragon dictation along with smart phrase technology. Any transcriptional errors that result from this process are unintentional and may not be corrected upon review.  Patient Instructions / Medication Changes & Studies & Tests Ordered   Patient Instructions  Medication Instructions:   No changes  *If you need a refill on your cardiac medications before your next appointment, please call your pharmacy*   Lab Work: BMP CBC If you have labs (blood work) drawn today and  your tests are completely normal, you will receive your results only by: MyChart Message (if you have MyChart) OR A paper copy in the mail If you have any lab test that is abnormal or we need to change your treatment, we will call you to review the results.   Testing/Procedures: Will be schedule at [facility] has recommended that you have a pacemaker inserted. A pacemaker is a small device that is placed under the skin of your chest or abdomen to help control abnormal heart rhythms. This  device uses electrical pulses to prompt the heart to beat at a normal rate. Pacemakers are used to treat heart rhythms that are too slow. Wire (leads) are attached to the pacemaker that goes into the chambers of you heart. This is done in the hospital and usually requires and overnight stay. Please see the instruction sheet given to you today for more information.    Follow-Up: At [facility], you and your health needs are our priority.  As part of our continuing mission to provide you with exceptional heart care, we have created designated Provider Care Teams.  These Care Teams include your primary Cardiologist (physician) and Advanced Practice Providers (APPs -  Physician Assistants and Nurse Practitioners) who all work together to provide you with the care you need, when you need it.     Your next appointment:   2 month(s)  The format for your next appointment:   In Person  Provider:   Glenetta Hew, MD   Other [place] [facility] [place] [place], [place]  [place], [place] [address]  Phone: [phone] Fax: [phone]   You are scheduled for a pacemaker Implant on Jul 13, 2021 with Dr. Sallyanne Kuster.   Please arrive at the [facility] at 11:30 am on the day of your procedure.    Studies Ordered:   Orders Placed This Encounter  Procedures   Basic metabolic panel   CBC   EKG 12-Lead      Glenetta Hew, M.D., M.S. Interventional Cardiologist   Pager # [phone] Phone # [phone] [address] [place], [place] [address]   Thank you for choosing [facility] at [facility]!!

## 2021-06-21 LAB — CBC
Hematocrit: 36 % (ref 34.0–46.6)
Hemoglobin: 12.4 g/dL (ref 11.1–15.9)
MCH: 29.7 pg (ref 26.6–33.0)
MCHC: 34.4 g/dL (ref 31.5–35.7)
MCV: 86 fL (ref 79–97)
Platelets: 306 10*3/uL (ref 150–450)
RBC: 4.18 x10E6/uL (ref 3.77–5.28)
RDW: 13.1 % (ref 11.7–15.4)
WBC: 9.5 10*3/uL (ref 3.4–10.8)

## 2021-06-21 LAB — BASIC METABOLIC PANEL
BUN/Creatinine Ratio: 26 (ref 12–28)
BUN: 22 mg/dL (ref 8–27)
CO2: 25 mmol/L (ref 20–29)
Calcium: 10.1 mg/dL (ref 8.7–10.3)
Chloride: 97 mmol/L (ref 96–106)
Creatinine, Ser: 0.85 mg/dL (ref 0.57–1.00)
Glucose: 109 mg/dL — ABNORMAL HIGH (ref 70–99)
Potassium: 4.6 mmol/L (ref 3.5–5.2)
Sodium: 139 mmol/L (ref 134–144)
eGFR: 70 mL/min/{1.73_m2} (ref >59)

## 2021-06-22 ENCOUNTER — Other Ambulatory Visit: Payer: Self-pay | Admitting: *Deleted

## 2021-06-23 NOTE — Assessment & Plan Note (Signed)
Intolerant to statins.  Refuses to take any lipid medications.  Understands the risks. We talked about the potential inclisiran, but she now is not interested.    Can discuss following pacemaker placement.

## 2021-06-23 NOTE — Assessment & Plan Note (Signed)
Still has frequent episodes of intermittent chest pain she says she was aching all over during part of the time between now and when I am seeing her, but it does not seem like anything is any different than she had had before.  We have increased her dose of Imdur in the past, not really sure if that made much of a difference.  She has had multiple ischemic evaluation since her last PCI all of which have been negative.  She could very well have microvascular disease, but I suspect a lot of this is psychosomatic.  Plan: Continue carvedilol and Imdur for antianginal benefit.  Continue losartan spironolactone as well as monotherapy with clopidogrel (this will need to be held for her PPM 5-7 days preop)  Not willing to discuss lipid management.

## 2021-06-23 NOTE — Assessment & Plan Note (Signed)
Significant amount of bursts of PAT in combination with high-grade AV block.  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  With her having CAD as well as history of PAF and now PAT, she needs to be on a beta-blocker which I would like to titrate up further, but we cannot at this point because of bradycardia.  Plan: Discussed with Dr. Sallyanne Kuster.  She will be scheduled for PPM placement

## 2021-06-23 NOTE — Assessment & Plan Note (Signed)
Not willing to take any other medications.  We can consider the possibility of inclisiran follow-up but suspect because the cost she would want to do it.

## 2021-06-23 NOTE — Assessment & Plan Note (Signed)
Because of overlapping stents in the LAD as well as RCA stent and SMA stents, she is on maintenance dose Plavix which can be held for procedures or surgeries.

## 2021-06-23 NOTE — Assessment & Plan Note (Signed)
Intermittent episodes of Mobitz 2 block along with frequent PAT episodes.  Reluctant to stop beta-blocker because of the PAT and prior PAF as well as history of significant CAD.  Discussed with Dr. Sallyanne Kuster.  Need for beta-blocker treatment with essentially tachybradycardia condition, class I indication for pacemaker.  PPM scheduled for November 11 with Dr. Sallyanne Kuster.

## 2021-06-23 NOTE — Assessment & Plan Note (Signed)
Borderline elevated blood pressure today which is usually well controlled.  She is on stable dose carvedilol and losartan along with her diuretic regimen of furosemide, indapamide and spironolactone.  We could titrate up losartan further, but would like to see consistent readings that are elevated.

## 2021-06-23 NOTE — Assessment & Plan Note (Signed)
Least 1 episode where she had if not full loss of consciousness partial loss of consciousness.  With high grade AV block noted on monitor and concern for possible tachybradycardia syndrome, I am concerned that her passout spells could be related to bradycardia/high-grade block.  While initially we considered stopping the beta-blocker, she does have CAD, PAF and PAT which all need to be treated with beta-blocker.  This makes it a class I indication for pacemaker.  I have talked to Dr. Sallyanne Kuster who saw her today and they have scheduled for pacemaker placement on November 11.

## 2021-06-23 NOTE — Assessment & Plan Note (Signed)
Chronic HFpEF with mild edema.  I suspect edema is probably more venous stasis because she does not really have PND or orthopnea.  Continue current dose of diuretic.  She has not required any additional dosing.  She does not want to any tighter support stockings that she is wearing.

## 2021-06-27 ENCOUNTER — Other Ambulatory Visit: Payer: Self-pay | Admitting: *Deleted

## 2021-07-03 ENCOUNTER — Other Ambulatory Visit: Payer: Self-pay | Admitting: *Deleted

## 2021-07-03 ENCOUNTER — Encounter: Payer: Self-pay | Admitting: *Deleted

## 2021-07-03 DIAGNOSIS — Z950 Presence of cardiac pacemaker: Secondary | ICD-10-CM

## 2021-07-03 DIAGNOSIS — I441 Atrioventricular block, second degree: Secondary | ICD-10-CM

## 2021-07-03 HISTORY — DX: Presence of cardiac pacemaker: Z95.0

## 2021-07-03 MED ORDER — SODIUM CHLORIDE 0.9% FLUSH: 3.0000 mL | Freq: Two times a day (BID) | INTRAVENOUS | Status: DC

## 2021-07-09 ENCOUNTER — Ambulatory Visit (HOSPITAL_COMMUNITY)
Admission: RE | Admit: 2021-07-09 | Discharge: 2021-07-10 | Disposition: A | Discharge status: Home / Self Care | Payer: Medicare Other | Attending: Cardiovascular Disease | Admitting: Cardiovascular Disease

## 2021-07-09 ENCOUNTER — Other Ambulatory Visit: Payer: Self-pay

## 2021-07-09 ENCOUNTER — Encounter (HOSPITAL_COMMUNITY)
Admission: RE | Disposition: A | Discharge status: Home / Self Care | Payer: Self-pay | Source: Home / Self Care | Attending: Cardiovascular Disease

## 2021-07-09 ENCOUNTER — Encounter (HOSPITAL_COMMUNITY): Payer: Self-pay | Admitting: Cardiovascular Disease

## 2021-07-09 DIAGNOSIS — I5032 Chronic diastolic (congestive) heart failure: Secondary | ICD-10-CM | POA: Diagnosis not present

## 2021-07-09 DIAGNOSIS — Z950 Presence of cardiac pacemaker: Secondary | ICD-10-CM | POA: Diagnosis present

## 2021-07-09 DIAGNOSIS — Z79899 Other long term (current) drug therapy: Secondary | ICD-10-CM | POA: Diagnosis not present

## 2021-07-09 DIAGNOSIS — I471 Supraventricular tachycardia: Secondary | ICD-10-CM | POA: Diagnosis not present

## 2021-07-09 DIAGNOSIS — Z955 Presence of coronary angioplasty implant and graft: Secondary | ICD-10-CM | POA: Diagnosis not present

## 2021-07-09 DIAGNOSIS — Z20822 Contact with and (suspected) exposure to covid-19: Secondary | ICD-10-CM | POA: Insufficient documentation

## 2021-07-09 DIAGNOSIS — I5030 Unspecified diastolic (congestive) heart failure: Secondary | ICD-10-CM | POA: Diagnosis present

## 2021-07-09 DIAGNOSIS — I110 Hypertensive heart disease with heart failure: Secondary | ICD-10-CM | POA: Diagnosis not present

## 2021-07-09 DIAGNOSIS — I2510 Atherosclerotic heart disease of native coronary artery without angina pectoris: Secondary | ICD-10-CM | POA: Diagnosis not present

## 2021-07-09 DIAGNOSIS — E785 Hyperlipidemia, unspecified: Secondary | ICD-10-CM | POA: Diagnosis not present

## 2021-07-09 DIAGNOSIS — E119 Type 2 diabetes mellitus without complications: Secondary | ICD-10-CM | POA: Insufficient documentation

## 2021-07-09 DIAGNOSIS — I495 Sick sinus syndrome: Secondary | ICD-10-CM | POA: Insufficient documentation

## 2021-07-09 DIAGNOSIS — I441 Atrioventricular block, second degree: Secondary | ICD-10-CM

## 2021-07-09 DIAGNOSIS — R0989 Other specified symptoms and signs involving the circulatory and respiratory systems: Secondary | ICD-10-CM | POA: Diagnosis present

## 2021-07-09 DIAGNOSIS — R001 Bradycardia, unspecified: Secondary | ICD-10-CM | POA: Diagnosis not present

## 2021-07-09 DIAGNOSIS — I25709 Atherosclerosis of coronary artery bypass graft(s), unspecified, with unspecified angina pectoris: Secondary | ICD-10-CM

## 2021-07-09 DIAGNOSIS — I25119 Atherosclerotic heart disease of native coronary artery with unspecified angina pectoris: Secondary | ICD-10-CM

## 2021-07-09 DIAGNOSIS — J939 Pneumothorax, unspecified: Secondary | ICD-10-CM

## 2021-07-09 DIAGNOSIS — I10 Essential (primary) hypertension: Secondary | ICD-10-CM | POA: Diagnosis present

## 2021-07-09 HISTORY — PX: PACEMAKER IMPLANT: EP1218

## 2021-07-09 LAB — RESP PANEL BY RT-PCR (FLU A&B, COVID) ARPGX2
Influenza A by PCR: NEGATIVE
Influenza B by PCR: NEGATIVE
SARS Coronavirus 2 by RT PCR: NEGATIVE

## 2021-07-09 SURGERY — PACEMAKER IMPLANT

## 2021-07-09 MED ORDER — CARVEDILOL 6.25 MG PO TABS
6.2500 mg | ORAL_TABLET | Freq: Two times a day (BID) | ORAL | Status: DC
  Administered 2021-07-10: 6.25 mg via ORAL
  Filled 2021-07-09: qty 1

## 2021-07-09 MED ORDER — SODIUM CHLORIDE 0.9 % IV SOLN
INTRAVENOUS | Status: AC
  Filled 2021-07-09: qty 2

## 2021-07-09 MED ORDER — YOU HAVE A PACEMAKER BOOK
Freq: Once | Status: AC
  Filled 2021-07-09: qty 1

## 2021-07-09 MED ORDER — SODIUM CHLORIDE 0.9 % IV SOLN
80.0000 mg | INTRAVENOUS | Status: AC
  Administered 2021-07-09: 80 mg
  Filled 2021-07-09: qty 2

## 2021-07-09 MED ORDER — SODIUM CHLORIDE 0.9 % IV SOLN: INTRAVENOUS | Status: DC

## 2021-07-09 MED ORDER — LIDOCAINE 5 % EX OINT
1.0000 "application " | TOPICAL_OINTMENT | Freq: Three times a day (TID) | CUTANEOUS | Status: DC | PRN
  Filled 2021-07-09: qty 35.44

## 2021-07-09 MED ORDER — VANCOMYCIN HCL 1000 MG/200ML IV SOLN
1000.0000 mg | INTRAVENOUS | Status: AC
  Administered 2021-07-09: 1000 mg via INTRAVENOUS
  Filled 2021-07-09: qty 200

## 2021-07-09 MED ORDER — VANCOMYCIN HCL IN DEXTROSE 1-5 GM/200ML-% IV SOLN
INTRAVENOUS | Status: AC
  Filled 2021-07-09: qty 200

## 2021-07-09 MED ORDER — SPIRONOLACTONE 25 MG PO TABS
25.0000 mg | ORAL_TABLET | Freq: Every day | ORAL | Status: DC
  Administered 2021-07-09 – 2021-07-10 (×2): 25 mg via ORAL
  Filled 2021-07-09 (×2): qty 1

## 2021-07-09 MED ORDER — SODIUM CHLORIDE 0.9 % IV SOLN
250.0000 mL | INTRAVENOUS | Status: DC
  Administered 2021-07-09: 250 mL via INTRAVENOUS

## 2021-07-09 MED ORDER — CHLORHEXIDINE GLUCONATE 4 % EX LIQD
4.0000 "application " | Freq: Once | CUTANEOUS | Status: DC
  Filled 2021-07-09: qty 60

## 2021-07-09 MED ORDER — FENTANYL CITRATE (PF) 100 MCG/2ML IJ SOLN
INTRAMUSCULAR | Status: AC
  Filled 2021-07-09: qty 2

## 2021-07-09 MED ORDER — INDAPAMIDE 2.5 MG PO TABS
2.5000 mg | ORAL_TABLET | Freq: Every day | ORAL | Status: DC
  Administered 2021-07-09: 2.5 mg via ORAL
  Filled 2021-07-09 (×2): qty 1

## 2021-07-09 MED ORDER — HYDROCODONE-ACETAMINOPHEN 5-325 MG PO TABS
1.0000 | ORAL_TABLET | ORAL | Status: DC | PRN
  Administered 2021-07-09: 1 via ORAL
  Administered 2021-07-10: 2 via ORAL
  Administered 2021-07-10: 1 via ORAL
  Filled 2021-07-09: qty 1
  Filled 2021-07-09: qty 2
  Filled 2021-07-09: qty 1

## 2021-07-09 MED ORDER — HEPARIN (PORCINE) IN NACL 1000-0.9 UT/500ML-% IV SOLN
INTRAVENOUS | Status: AC
  Filled 2021-07-09: qty 500

## 2021-07-09 MED ORDER — FUROSEMIDE 40 MG PO TABS
80.0000 mg | ORAL_TABLET | Freq: Two times a day (BID) | ORAL | Status: DC
  Administered 2021-07-10: 80 mg via ORAL
  Filled 2021-07-09: qty 2

## 2021-07-09 MED ORDER — ONDANSETRON HCL 4 MG/2ML IJ SOLN: 4.0000 mg | Freq: Four times a day (QID) | INTRAMUSCULAR | Status: DC | PRN

## 2021-07-09 MED ORDER — LIDOCAINE HCL (PF) 1 % IJ SOLN
INTRAMUSCULAR | Status: DC | PRN
  Administered 2021-07-09: 50 mL

## 2021-07-09 MED ORDER — KETOROLAC TROMETHAMINE 30 MG/ML IJ SOLN
15.0000 mg | Freq: Once | INTRAMUSCULAR | Status: AC
  Administered 2021-07-09: 15 mg via INTRAVENOUS
  Filled 2021-07-09: qty 1

## 2021-07-09 MED ORDER — CLOPIDOGREL BISULFATE 75 MG PO TABS
75.0000 mg | ORAL_TABLET | Freq: Every day | ORAL | Status: DC
  Administered 2021-07-09 – 2021-07-10 (×2): 75 mg via ORAL
  Filled 2021-07-09 (×2): qty 1

## 2021-07-09 MED ORDER — MIDAZOLAM HCL 5 MG/5ML IJ SOLN
INTRAMUSCULAR | Status: DC | PRN
  Administered 2021-07-09 (×3): 1 mg via INTRAVENOUS

## 2021-07-09 MED ORDER — HEPARIN (PORCINE) IN NACL 1000-0.9 UT/500ML-% IV SOLN
INTRAVENOUS | Status: DC | PRN
  Administered 2021-07-09: 500 mL

## 2021-07-09 MED ORDER — VANCOMYCIN HCL 1000 MG/200ML IV SOLN
1000.0000 mg | Freq: Two times a day (BID) | INTRAVENOUS | Status: AC
  Administered 2021-07-10: 1000 mg via INTRAVENOUS
  Filled 2021-07-09: qty 200

## 2021-07-09 MED ORDER — ISOSORBIDE MONONITRATE ER 30 MG PO TB24
30.0000 mg | ORAL_TABLET | Freq: Every day | ORAL | Status: DC
  Administered 2021-07-09 – 2021-07-10 (×2): 30 mg via ORAL
  Filled 2021-07-09 (×2): qty 1

## 2021-07-09 MED ORDER — ACETAMINOPHEN 325 MG PO TABS: 325.0000 mg | ORAL_TABLET | ORAL | Status: DC | PRN

## 2021-07-09 MED ORDER — FENTANYL CITRATE (PF) 100 MCG/2ML IJ SOLN
25.0000 ug | INTRAMUSCULAR | Status: DC | PRN
  Administered 2021-07-09 (×2): 25 ug via INTRAVENOUS

## 2021-07-09 MED ORDER — LIDOCAINE HCL (PF) 1 % IJ SOLN
INTRAMUSCULAR | Status: AC
  Filled 2021-07-09: qty 60

## 2021-07-09 MED ORDER — FENTANYL CITRATE (PF) 100 MCG/2ML IJ SOLN
INTRAMUSCULAR | Status: DC | PRN
  Administered 2021-07-09 (×5): 25 ug via INTRAVENOUS

## 2021-07-09 MED ORDER — SODIUM CHLORIDE 0.9% FLUSH: 3.0000 mL | INTRAVENOUS | Status: DC | PRN

## 2021-07-09 MED ORDER — MIDAZOLAM HCL 5 MG/5ML IJ SOLN
INTRAMUSCULAR | Status: AC
  Filled 2021-07-09: qty 5

## 2021-07-09 MED ORDER — LOSARTAN POTASSIUM 50 MG PO TABS
50.0000 mg | ORAL_TABLET | Freq: Every day | ORAL | Status: DC
  Filled 2021-07-09: qty 1

## 2021-07-09 SURGICAL SUPPLY — 8 items
CABLE SURGICAL S-101-97-12 (CABLE) ×2 IMPLANT
IPG PACE AZUR XT DR MRI W1DR01 (Pacemaker) IMPLANT
LEAD CAPSURE NOVUS 5076-52CM (Lead) ×1 IMPLANT
LEAD CAPSURE NOVUS 5076-58CM (Lead) ×1 IMPLANT
PACE AZURE XT DR MRI W1DR01 (Pacemaker) ×2 IMPLANT
PAD PRO RADIOLUCENT 2001M-C (PAD) ×2 IMPLANT
SHEATH 7FR PRELUDE SNAP 13 (SHEATH) ×2 IMPLANT
TRAY PACEMAKER INSERTION (PACKS) ×2 IMPLANT

## 2021-07-09 NOTE — Progress Notes (Signed)
Nasal swab for COVID obtained and tubed to lab

## 2021-07-09 NOTE — Progress Notes (Signed)
Resting quietly, eyes closed. Vital signs stable.

## 2021-07-09 NOTE — Progress Notes (Signed)
Dr. Sallyanne Kuster talking w/patient

## 2021-07-09 NOTE — Interval H&P Note (Signed)
History and Physical Interval Note:  07/09/2021 12:50 PM  [name]  has presented today for surgery, with the diagnosis of Mobitz 2.  The various methods of treatment have been discussed with the patient and family. After consideration of risks, benefits and other options for treatment, the patient has consented to  Procedure(s): PACEMAKER IMPLANT (N/A) as a surgical intervention.  The patient's history has been reviewed, patient examined, no change in status, stable for surgery.  I have reviewed the patient's chart and labs.  Questions were answered to the patient's satisfaction.     [name]

## 2021-07-09 NOTE — Op Note (Signed)
Procedure report  Procedure performed:  1. Implantation of new dual chamber permanent pacemaker 2. Fluoroscopy 3. Sedation  Reason for procedure:  Symptomatic bradycardia due to: Bradycardia due to necessary medications Second degree atrioventricular block Mobitz type II  Procedure performed by: Sanda Klein, MD  Complications: None  Estimated blood loss: <10 mL  Medications administered during procedure: Vancomycin 1 g intravenously Lidocaine 1% 30 mL locally,  Fentanyl 125 mcg intravenously Versed 3 mg intravenously  Device details: Generator Medtronic Azure XT DR model [id number] serial number [id number] Right atrial lead Medtronic [id number] serial number [id number] Right ventricular lead Medtronic [id number] serial number [id number]  Procedure details:  After the risks and benefits of the procedure were discussed the patient provided informed consent and was brought to the cardiac cath lab in the fasting state. The patient was prepped and draped in usual sterile fashion. Local anesthesia with 1% lidocaine was administered to to the left infraclavicular area. A 5-6 cm horizontal incision was made parallel with and 2-3 cm caudal to the left clavicle. Using electrocautery and blunt dissection a prepectoral pocket was created down to the level of the pectoralis major muscle fascia. The pocket was carefully inspected for hemostasis.   Under fluoroscopic guidance and using the modified Seldinger technique 2 separate venipunctures were performed to access the left subclavian vein. No difficulty was encountered accessing the vein.  Two J-tip guidewires were subsequently exchanged for two 7 French safe sheaths.  Under fluoroscopic guidance the ventricular lead was advanced to level of the mid to apical right ventricular septum and the active-fixation helix was deployed. Prominent current of injury was seen. Satisfactory pacing and sensing parameters were recorded, but the impedance was  >1500 ohm. There was no evidence of diaphragmatic stimulation at maximum device output.   In similar fashion the right atrial lead was advanced to the level of the atrial appendage. The active-fixation helix was deployed. In the initial location, the current of injury was very poor. The helix was retracted and the lead was repositioned. There was prominent current of injury. Satisfactory  pacing and sensing parameters were recorded. There was no evidence of diaphragmatic stimulation with pacing at maximum device output. The safe sheath was peeled away and the lead was secured in place with 2-0 silk.  At this point, the patient described mid-sternal pleuritic pain, worsened by breathing. The ventricular lead, which had good electrical parameters other than persistently elevated impedance, was repositioned (the stilet was reintroduced, the helix was carefully retracted, the lead was placed in a slightly different location and the helix was extended). Satisfactory pacing and sensing parameters were recorded, but the impedance was >1500 ohm. There was no evidence of diaphragmatic stimulation at maximum device output. The safe sheath was peeled away and the lead was secured in place with 2-0 silk.  Pleuritic pain persisted, so the the right atrial lead was repositioned in similar fashion.  The pocket was flushed with copious amounts of antibiotic solution. Reinspection showed excellent hemostasis..  The ventricular lead was connected to the generator and appropriate ventricular pacing was seen. Subsequently the atrial lead was also connected. Repeat testing of the lead parameters later showed excellent values.  The entire system was then carefully inserted in the pocket with care been taking that the leads and device assumed a comfortable position without pressure on the incision. Great care was taken that the leads be located deep to the generator. The pocket was then closed in layers using 2 layers of 2-0  Vicryl, one layer  of 3-0 Vicryl and cutaneous steristrips, after which a sterile dressing was applied.  At the end of the procedure the following lead parameters were encountered:  Right atrial lead  sensed P waves 2.3 mV, impedance 570 ohms, threshold 0.5 V at 0.4 ms pulse width.  Right ventricular lead sensed R waves 14.8 mV, impedance 1140 ohms, threshold 0.75 V at 0.4 ms pulse width.  Pacing both A and V lead in unipolar mode still had good capture thresholds.  Bedside portable ultrasound examination showed no evidence of pericardial effusion.  Plan overnight observation for possible lead perforation.   Sanda Klein, MD, [facility] [phone] office [phone] pager

## 2021-07-10 ENCOUNTER — Encounter (HOSPITAL_COMMUNITY): Payer: Self-pay | Admitting: Cardiovascular Disease

## 2021-07-10 ENCOUNTER — Ambulatory Visit (HOSPITAL_BASED_OUTPATIENT_CLINIC_OR_DEPARTMENT_OTHER): Payer: Medicare Other

## 2021-07-10 ENCOUNTER — Ambulatory Visit (HOSPITAL_COMMUNITY): Payer: Medicare Other

## 2021-07-10 DIAGNOSIS — I495 Sick sinus syndrome: Secondary | ICD-10-CM | POA: Diagnosis not present

## 2021-07-10 DIAGNOSIS — I5030 Unspecified diastolic (congestive) heart failure: Secondary | ICD-10-CM | POA: Diagnosis not present

## 2021-07-10 DIAGNOSIS — R0789 Other chest pain: Secondary | ICD-10-CM

## 2021-07-10 DIAGNOSIS — Z955 Presence of coronary angioplasty implant and graft: Secondary | ICD-10-CM | POA: Diagnosis not present

## 2021-07-10 DIAGNOSIS — J939 Pneumothorax, unspecified: Secondary | ICD-10-CM | POA: Diagnosis not present

## 2021-07-10 DIAGNOSIS — E119 Type 2 diabetes mellitus without complications: Secondary | ICD-10-CM | POA: Diagnosis not present

## 2021-07-10 DIAGNOSIS — I5032 Chronic diastolic (congestive) heart failure: Secondary | ICD-10-CM | POA: Diagnosis not present

## 2021-07-10 DIAGNOSIS — I2510 Atherosclerotic heart disease of native coronary artery without angina pectoris: Secondary | ICD-10-CM | POA: Diagnosis not present

## 2021-07-10 DIAGNOSIS — I110 Hypertensive heart disease with heart failure: Secondary | ICD-10-CM | POA: Diagnosis not present

## 2021-07-10 DIAGNOSIS — I471 Supraventricular tachycardia: Secondary | ICD-10-CM | POA: Diagnosis not present

## 2021-07-10 DIAGNOSIS — E785 Hyperlipidemia, unspecified: Secondary | ICD-10-CM | POA: Diagnosis not present

## 2021-07-10 DIAGNOSIS — Z79899 Other long term (current) drug therapy: Secondary | ICD-10-CM | POA: Diagnosis not present

## 2021-07-10 DIAGNOSIS — I441 Atrioventricular block, second degree: Secondary | ICD-10-CM | POA: Diagnosis not present

## 2021-07-10 DIAGNOSIS — Z950 Presence of cardiac pacemaker: Secondary | ICD-10-CM | POA: Diagnosis not present

## 2021-07-10 DIAGNOSIS — Z20822 Contact with and (suspected) exposure to covid-19: Secondary | ICD-10-CM | POA: Diagnosis not present

## 2021-07-10 HISTORY — PX: TRANSTHORACIC ECHOCARDIOGRAM: SHX275

## 2021-07-10 LAB — ECHOCARDIOGRAM LIMITED
Area-P 1/2: 4.31 cm2
Height: 67 in
S' Lateral: 3.2 cm
Weight: 3104 oz

## 2021-07-10 LAB — SURGICAL PCR SCREEN
MRSA, PCR: NEGATIVE
Staphylococcus aureus: NEGATIVE

## 2021-07-10 MED ORDER — SPIRONOLACTONE 25 MG PO TABS: 25.0000 mg | ORAL_TABLET | ORAL | Status: DC

## 2021-07-10 MED ORDER — ISOSORBIDE MONONITRATE ER 60 MG PO TB24: 30.0000 mg | ORAL_TABLET | Freq: Every day | ORAL | 1 refills | Status: DC

## 2021-07-10 MED ORDER — HYDROCODONE-ACETAMINOPHEN 5-325 MG PO TABS: 1.0000 | ORAL_TABLET | Freq: Four times a day (QID) | ORAL | 0 refills | Status: DC | PRN

## 2021-07-10 NOTE — Discharge Instructions (Addendum)
Supplemental Discharge Instructions for  Pacemaker Patients  Activity Do not raise your left/right arm above shoulder level or extend it backward beyond shoulder level for 2 weeks. Wear the arm sling as a reminder or as needed for comfort for 2 weeks. No heavy lifting or vigorous activity with your left/right arm for 6-8 weeks.    NO DRIVING is preferable for 2 weeks; If absolutely necessary, drive only short, familiar routes. DO wear your seatbelt, even if it crosses over the pacemaker site.  WOUND CARE Keep the wound area clean and dry.  Remove the dressing the day after you return home (usually 48 hours after the procedure). DO NOT SUBMERGE UNDER WATER UNTIL FULLY HEALED (no tub baths, hot tubs, swimming pools, etc.).  You  may shower or take a sponge bath after the dressing is removed. DO NOT SOAK the area and do not allow the shower to directly spray on the site. If you have staples, these will be removed in the office in 7-14 days. If you have tape/steri-strips on your wound, these will fall off; do not pull them off prematurely.   No bandage is needed on the site.  DO  NOT apply any creams, oils, or ointments to the wound area. If you notice any drainage or discharge from the wound, any swelling, excessive redness or bruising at the site, or if you develop a fever > 101? F after you are discharged home, call the office at once.  Special Instructions You are still able to use cellular telephones.  Avoid carrying your cellular phone near your device. When traveling through airports, show security personnel your identification card to avoid being screened in the metal detectors.  Avoid arc welding equipment, MRI testing (magnetic resonance imaging), TENS units (transcutaneous nerve stimulators).  Call the office for questions about other devices. Avoid electrical appliances that are in poor condition or are not properly grounded. Microwave ovens are safe to be near or to operate.

## 2021-07-10 NOTE — Progress Notes (Addendum)
Progress Note  Patient Name: [name] Date of Encounter: 07/10/2021  Primary Cardiologist: Glenetta Hew, MD  Subjective   Still having pleuritic chest pain, states she wouldn't have done the pacer if she had known it would hurt like this. Op note also Also requesting to be discharged so that she can go home and bake cakes for her upcoming craft sale on Saturday.  Nurse just got call report that CXR shows small R apical ptx.  Inpatient Medications    Scheduled Meds:  carvedilol  6.25 mg Oral BID WC   clopidogrel  75 mg Oral Daily   furosemide  80 mg Oral BID WC   indapamide  2.5 mg Oral Daily   isosorbide mononitrate  30 mg Oral Daily   losartan  50 mg Oral Daily   spironolactone  25 mg Oral Daily   Continuous Infusions:  PRN Meds: acetaminophen, fentaNYL (SUBLIMAZE) injection, HYDROcodone-acetaminophen, lidocaine, ondansetron (ZOFRAN) IV   Vital Signs    Vitals:   07/09/21 2344 07/10/21 0045 07/10/21 0345 07/10/21 0452  BP: (!) 105/42 (!) 127/44 (!) 101/45 (!) 112/49  Pulse: 76 77 74 74  Resp: 18 15 16 19   Temp:  97.8 F (36.6 C)  97.6 F (36.4 C)  TempSrc:  Oral  Oral  SpO2: 96% 97% 97% 96%  Weight:      Height:       No intake or output data in the 24 hours ending 07/10/21 0816 Last 3 Weights 07/09/2021 06/20/2021 03/22/2021  Weight (lbs) 194 lb 200 lb 6.4 oz 199 lb 6.4 oz  Weight (kg) 87.998 kg 90.901 kg 90.447 kg     Telemetry    NSR - Personally Reviewed  Physical Exam   GEN: No acute distress.  HEENT: Normocephalic, atraumatic, sclera non-icteric. Neck: No JVD or bruits. Cardiac: RRR no murmurs, rubs, or gallops. Pacer L upper chest wall with some oozing through dressing Respiratory: Clear to auscultation bilaterally. Breathing is unlabored. GI: Soft, nontender, non-distended, BS +x 4. MS: no deformity. Extremities: No clubbing or cyanosis. No edema. Distal pedal pulses are 2+ and equal bilaterally. Neuro:  AAOx3. Follows commands. Psych:   Responds to questions appropriately with a normal affect.  Labs    N/A  Radiology    EP PPM/ICD IMPLANT  Result Date: 07/09/2021 1. Implantation of new dual chamber permanent pacemaker 2. Fluoroscopy 3. Sedation Reason for procedure: Symptomatic bradycardia due to: Bradycardia due to necessary medications Second degree atrioventricular block Mobitz type II Procedure performed by: Sanda Klein, MD Complications: None Estimated blood loss: <10 mL Medications administered during procedure: Vancomycin 1 g intravenously Lidocaine 1% 30 mL locally, Fentanyl 125 mcg intravenously Versed 3 mg intravenously Device details: Generator Medtronic Azure XT DR model [id number] serial number [id number] Right atrial lead Medtronic [id number] serial number [id number] Right ventricular lead Medtronic [id number] serial number [id number] Procedure details: After the risks and benefits of the procedure were discussed the patient provided informed consent and was brought to the cardiac cath lab in the fasting state. The patient was prepped and draped in usual sterile fashion. Local anesthesia with 1% lidocaine was administered to to the left infraclavicular area. A 5-6 cm horizontal incision was made parallel with and 2-3 cm caudal to the left clavicle. Using electrocautery and blunt dissection a prepectoral pocket was created down to the level of the pectoralis major muscle fascia. The pocket was carefully inspected for hemostasis. Under fluoroscopic guidance and using the modified Seldinger technique 2 separate venipunctures were performed  to access the left subclavian vein. No difficulty was encountered accessing the vein.  Two J-tip guidewires were subsequently exchanged for two 7 French safe sheaths. Under fluoroscopic guidance the ventricular lead was advanced to level of the mid to apical right ventricular septum and the active-fixation helix was deployed. Prominent current of injury was seen. Satisfactory pacing and sensing  parameters were recorded, but the impedance was >1500 ohm. There was no evidence of diaphragmatic stimulation at maximum device output. In similar fashion the right atrial lead was advanced to the level of the atrial appendage. The active-fixation helix was deployed. In the initial location, the current of injury was very poor. The helix was retracted and the lead was repositioned. There was prominent current of injury. Satisfactory  pacing and sensing parameters were recorded. There was no evidence of diaphragmatic stimulation with pacing at maximum device output. The safe sheath was peeled away and the lead was secured in place with 2-0 silk. At this point, the patient described mid-sternal pleuritic pain, worsened by breathing. The ventricular lead, which had good electrical parameters other than persistently elevated impedance, was repositioned (the stilet was reintroduced, the helix was carefully retracted, the lead was placed in a slightly different location and the helix was extended). Satisfactory pacing and sensing parameters were recorded, but the impedance was >1500 ohm. There was no evidence of diaphragmatic stimulation at maximum device output. The safe sheath was peeled away and the lead was secured in place with 2-0 silk. Pleuritic pain persisted, so the the right atrial lead was repositioned in similar fashion. The pocket was flushed with copious amounts of antibiotic solution. Reinspection showed excellent hemostasis.. The ventricular lead was connected to the generator and appropriate ventricular pacing was seen. Subsequently the atrial lead was also connected. Repeat testing of the lead parameters later showed excellent values. The entire system was then carefully inserted in the pocket with care been taking that the leads and device assumed a comfortable position without pressure on the incision. Great care was taken that the leads be located deep to the generator. The pocket was then closed in  layers using 2 layers of 2-0 Vicryl, one layer of 3-0 Vicryl and cutaneous steristrips, after which a sterile dressing was applied. At the end of the procedure the following lead parameters were encountered: Right atrial lead sensed P waves 2.3 mV, impedance 570 ohms, threshold 0.5 V at 0.4 ms pulse width. Right ventricular lead sensed R waves 14.8 mV, impedance 1140 ohms, threshold 0.75 V at 0.4 ms pulse width. Pacing both A and V lead in unipolar mode still had good capture thresholds. Bedside portable ultrasound examination showed no evidence of pericardial effusion. Plan overnight observation for possible lead perforation.    Cardiac Studies   PPM implantation 11/7, see report  Patient Profile     80 y.o. female with CAD s/p prior PCIwith chronic episodic chest pain, chronic diastolic CHF, HLD (pt declines statins/alternatives), DM2, PAT, prior perioperative PAF (not on [facility]), PAT, bradycardia, Mobitz 2 AVB, syncope, HTN, chronic back pain, venous stasis, abdominal PAD and renal arterey stenosis s/p remote L renal artery stenting, diverticulitis, hepatomegaly, hiatal hernia, kidney stones, remote stomach ulcers, IBS, esophageal stricture who [facility] for planned pacemaker.  Assessment & Plan    1. Tachy brady syncope s/p Medtronic PPM 11/7 - patient still with pleuritic CP, per nurse report CXR shows small R apical pneumothorax - bedside echo yesterday during this pain showed no pericardial effusion - maintaining NSR on RA -  plan stat limited echo this AM - will review with Dr. Sallyanne Kuster  3.  CAD s/p prior PCI with chronic chest pain per Dr. Allison Quarry notes - continue medical therapy as tolerated, Dr. Sallyanne Kuster did not make any changes to admission regimen  3. HTN  - SBP variable during admission, low 100s to 160s - has tendency for low diastolics - spoke with nurse to hold AM meds this morning pending Dr. Victorino December input - also requested f/u BP to see trend from this AM  4.  Remote hx PAF - none seen on tele overnight - not OAC prior to admission  For questions or updates, please contact [place] Please consult [email or web address] for contact info under Cardiology/STEMI.  Signed, Charlie Pitter, PA-C 07/10/2021, 8:16 AM    I have seen and examined the patient along with Charlie Pitter, PA-C.  I have reviewed the chart, notes and new data.  I agree with PA/NP's note.  Key new complaints: still has pleuritic chest discomfort, central and slightly to the right. She describes it as 8/10, but she is comfortably holding a conversation and eating breakfast. No distress. Key examination changes: RRR, no murmurs or pericardial or pleural rub, clear lungs. Slight bloody spotting on pacemaker site dressing, no hematoma or active bleeding. O2 sat 96% on room air. BP low normal 110s/50s. Key new findings / data: CXR shows a tiny right pneumothorax, contralateral to the pacemaker implantation site. There was no R pneumo on most recent CXR (but that was in 2015). Lead position in RA and RV is normal. Lead parameters are excellent:  RA lead P waves 2.5 mV, impedance 456 ohm, threshold 0.5V@0 .4 ms  RV lead R waves 10.6 mV, impedance 1064 ohm, threshold 0.5V@0 .4 ms   PLAN: Hard to say whether her pleuritic pain is due to pericarditis (both RA and RV leads were repositioned after she described discomfort in the lab during device implantation, no pericardial fluid on immediate postop bedside US) or alternatively due to small right pneumothorax (which is hard to explain with left side implantation). Will check a limited echo for pericardial effusion. Symptomatic treatment is difficult due to multiple drug allergies. Cannot take opiates or tramadol. Limited to NSAIDs (she tells me that she takes 2 Tylenol and 2 Advil frequently at home for other types of pain). Will stop the clopidogrel temporarily (last PCI was 2016) temporarily. Hold indapamide due to lower BP. Continue  carvedilol. Plan DC later today if no large pericardial effusion.  Sanda Klein, MD, [facility] [phone] 07/10/2021, 9:32 AM

## 2021-07-10 NOTE — Progress Notes (Signed)
  Echocardiogram 2D Echocardiogram has been performed.  [name] F 07/10/2021, 9:53 AM

## 2021-07-10 NOTE — Discharge Summary (Addendum)
Discharge Summary    Patient ID: [name] MRN: [id number]; DOB: Apr 24, 1941  Admit date: 07/09/2021 Discharge date: 07/10/2021  PCP:  Kristopher Glee., MD   [facility] Providers Cardiologist:  Glenetta Hew, MD  Electrophysiologist:  Sanda Klein, MD       Discharge Diagnoses    Principal Problem:   Mobitz type 2 second degree atrioventricular block Active Problems:   Congestive heart failure with LV diastolic dysfunction, NYHA class 2 (HCC)   Essential hypertension   CAD in native artery   Pacemaker   Pneumothorax    Diagnostic Studies/Procedures    1.  PPM Implantation 07/10/21 Device details: Generator Medtronic [place] DR model [id number] serial number [id number] Right atrial lead Medtronic [id number] serial number [id number] Right ventricular lead Medtronic [id number] serial number [id number]  2. Limited echo 07/10/21  1. Left ventricular ejection fraction, by estimation, is 60 to 65%. The  left ventricle has normal function. The left ventricle has no regional  wall motion abnormalities. Left ventricular diastolic parameters are  consistent with Grade I diastolic  dysfunction (impaired relaxation).   2. Right ventricular systolic function is normal. The right ventricular  size is normal. There is normal pulmonary artery systolic pressure.   3. Left atrial size was mildly dilated.   4. The mitral valve is normal in structure. No evidence of mitral valve  regurgitation. No evidence of mitral stenosis.   5. The aortic valve is tricuspid. Aortic valve regurgitation is not  visualized. No aortic stenosis is present.   6. The inferior vena cava is normal in size with greater than 50%  respiratory variability, suggesting right atrial pressure of 3 mmHg.  Pericardium: There is no evidence of pericardial effusion.  _____________   History of Present Illness     [name] is a 80 y.o. female with CAD s/p remote PCI with chronic episodic chest pain, chronic  diastolic CHF, HLD (pt declines statins/alternatives), DM2, PAT, prior perioperative PAF (not on [place]), PAT, bradycardia, Mobitz 2 AVB, syncope, HTN, chronic back pain, venous stasis, abdominal PAD with prior celiac stenting and renal artery stenosis s/p remote L renal artery stenting, diverticulitis, hepatomegaly, hiatal hernia, kidney stones, remote stomach ulcers, IBS, esophageal stricture who presented to [facility] for planned pacemaker.  Ms. Schickling has been followed by Dr. Ellyn Hack for history above. She saw Dr. Ellyn Hack in 03/2021 for palpitations and underwent event monitor 04/2021 showing average HR 82bpm, baseline first degree AVB with 5 episodes of high grade Mobitz 2 AV block with HR as low as 29bpm, longest lasting 20 seconds, also frequent episodes of PAT/SVT. She had palpitations, fatigue and near-syncope while wearing the monitor. She also has very frequent chronic chest pain with last relook cath 2016 not demonstrating any significant restenosis (Dr. Ellyn Hack stated he previously felt possibly microvascular disease but more likely psychosomatic in etiology). Given presence of tachy-brady syndrome, Dr. Ellyn Hack recommended pacemaker implantation.  Hospital Course     She presented to the hospital for this planned procedure yesterday and underwent PPM implantation with Generator Medtronic Azure XT DR model [id number] serial number [id number]. During the procedure, the patient did describe some mid-sternal pleuritic pain, worsened by breathing so the ventricular lead was repositioned. Her pleuritic pain persisted so the right atrial lead was repositioned. Bedside portable echo showed no evidence of pericardial effusion. Follow-up device interrogation was normal today. This morning she still has some degree of pleuritic chest pain although states this is much improved from yesterday. As per above, she  does have very frequent chronic chest pain otherwise. Post-implant CXR showed a small right apical pneumothorax,  contralateral to pacemaker implantation site. Regarding residual discomfort, per Dr. Sallyanne Kuster, it is hard to say whether her pleuritic pain is due to pericarditis (both RA and RV leads were repositioned after she described discomfort in the lab during device implantation, no pericardial fluid on immediate postop bedside US) or alternatively due to small right pneumothorax (which is hard to explain with left side implantation). Repeat limited echo did not show any evidence of an effusion or acute findings. Dr. Sallyanne Kuster did authorize short term rx for hydrocodone/APAP to use after discharge - her chart reported prior allergy to codeine and Tramadol although patient has taken several doses while in the hospital without any reaction. PDMP was reviewed without any inappropriate findings. We also included recommendations to be aware this medicine contains acetaminophen and she should not take more than 1000mg  total of acetaminophen every 6 hours or 4000mg  per day. Dr. Sallyanne Kuster recommended to stop clopidogrel temporarily. He also recommended to hold indapamide given tendency for soft BP at times. Regarding isosorbide, the patient reports she was recently told by Dr. Ellyn Hack to lower her dose from 60mg  to 30mg  recently but I cannot find this in the notes. Dr. Sallyanne Kuster has suggested to have her cut her 60mg  tablets in half to take 30mg  daily for now. For her pneumothorax, Dr. Sallyanne Kuster recommended conservative management; ER precautions were relayed to patient. Post-pacemaker instructions also outlined in detail for patient. Dr. Sallyanne Kuster has seen and examined the patient today and feels she is stable for discharge. She has wound care follow-up 07/19/21 as well as 3 month follow-up with Dr. Sallyanne Kuster. Dr. Sallyanne Kuster has also sent a message to his nurse to arrange close f/u in December as well so they can review the timing of restarting clopidogrel.   Did the patient have an acute coronary syndrome (MI, NSTEMI, STEMI, etc) this  admission?:  No                               Did the patient have a percutaneous coronary intervention (stent / angioplasty)?:  No.      _____________  Discharge Vitals Blood pressure (!) 129/50, pulse 77, temperature 97.8 F (36.6 C), temperature source Oral, resp. rate 16, height 5\' 7"  (1.702 m), weight 88 kg, SpO2 96 %.  Filed Weights   07/09/21 1150  Weight: 88 kg    Labs & Radiologic Studies    N/A Pre-procedure labs reviwed. _____________  DG Chest 2 View  Result Date: 07/10/2021 CLINICAL DATA:  Pacemaker placement EXAM: CHEST - 2 VIEW COMPARISON:  05/06/2014 FINDINGS: Cardiac size is in the upper limits of normal. Coronary artery stent is noted. There are no signs of alveolar pulmonary edema or new focal infiltrates. There is minimal blunting of lateral CP angles. There is placement of pacemaker battery in the left infraclavicular region with tips of leads in right atrium and right ventricle. Small right apical pneumothorax is seen. Right lung remains well expanded. IMPRESSION: Small right apical pneumothorax is seen. Pacemaker battery is seen in the left infraclavicular region. There are no signs of pulmonary edema or focal pulmonary consolidation. Imaging finding of small right apical pneumothorax was relayed to patient's nurse Nate by telephone call at 8:13 a.m. on 07/10/2021. He will relay the information to the treating physician. Electronically Signed   By: Elmer Picker M.D.   On: 07/10/2021  08:17   EP PPM/ICD IMPLANT  Result Date: 07/09/2021 1. Implantation of new dual chamber permanent pacemaker 2. Fluoroscopy 3. Sedation Reason for procedure: Symptomatic bradycardia due to: Bradycardia due to necessary medications Second degree atrioventricular block Mobitz type II Procedure performed by: Sanda Klein, MD Complications: None Estimated blood loss: <10 mL Medications administered during procedure: Vancomycin 1 g intravenously Lidocaine 1% 30 mL locally, Fentanyl 125  mcg intravenously Versed 3 mg intravenously Device details: Generator Medtronic Azure XT DR model [id number] serial number [id number] Right atrial lead Medtronic [id number] serial number [id number] Right ventricular lead Medtronic [id number] serial number [id number] Procedure details: After the risks and benefits of the procedure were discussed the patient provided informed consent and was brought to the cardiac cath lab in the fasting state. The patient was prepped and draped in usual sterile fashion. Local anesthesia with 1% lidocaine was administered to to the left infraclavicular area. A 5-6 cm horizontal incision was made parallel with and 2-3 cm caudal to the left clavicle. Using electrocautery and blunt dissection a prepectoral pocket was created down to the level of the pectoralis major muscle fascia. The pocket was carefully inspected for hemostasis. Under fluoroscopic guidance and using the modified Seldinger technique 2 separate venipunctures were performed to access the left subclavian vein. No difficulty was encountered accessing the vein.  Two J-tip guidewires were subsequently exchanged for two 7 French safe sheaths. Under fluoroscopic guidance the ventricular lead was advanced to level of the mid to apical right ventricular septum and the active-fixation helix was deployed. Prominent current of injury was seen. Satisfactory pacing and sensing parameters were recorded, but the impedance was >1500 ohm. There was no evidence of diaphragmatic stimulation at maximum device output. In similar fashion the right atrial lead was advanced to the level of the atrial appendage. The active-fixation helix was deployed. In the initial location, the current of injury was very poor. The helix was retracted and the lead was repositioned. There was prominent current of injury. Satisfactory  pacing and sensing parameters were recorded. There was no evidence of diaphragmatic stimulation with pacing at maximum device output. The  safe sheath was peeled away and the lead was secured in place with 2-0 silk. At this point, the patient described mid-sternal pleuritic pain, worsened by breathing. The ventricular lead, which had good electrical parameters other than persistently elevated impedance, was repositioned (the stilet was reintroduced, the helix was carefully retracted, the lead was placed in a slightly different location and the helix was extended). Satisfactory pacing and sensing parameters were recorded, but the impedance was >1500 ohm. There was no evidence of diaphragmatic stimulation at maximum device output. The safe sheath was peeled away and the lead was secured in place with 2-0 silk. Pleuritic pain persisted, so the the right atrial lead was repositioned in similar fashion. The pocket was flushed with copious amounts of antibiotic solution. Reinspection showed excellent hemostasis.. The ventricular lead was connected to the generator and appropriate ventricular pacing was seen. Subsequently the atrial lead was also connected. Repeat testing of the lead parameters later showed excellent values. The entire system was then carefully inserted in the pocket with care been taking that the leads and device assumed a comfortable position without pressure on the incision. Great care was taken that the leads be located deep to the generator. The pocket was then closed in layers using 2 layers of 2-0 Vicryl, one layer of 3-0 Vicryl and cutaneous steristrips, after which a sterile dressing was applied. At the end  of the procedure the following lead parameters were encountered: Right atrial lead sensed P waves 2.3 mV, impedance 570 ohms, threshold 0.5 V at 0.4 ms pulse width. Right ventricular lead sensed R waves 14.8 mV, impedance 1140 ohms, threshold 0.75 V at 0.4 ms pulse width. Pacing both A and V lead in unipolar mode still had good capture thresholds. Bedside portable ultrasound examination showed no evidence of pericardial effusion.  Plan overnight observation for possible lead perforation.   ECHOCARDIOGRAM LIMITED  Result Date: 07/10/2021    ECHOCARDIOGRAM LIMITED REPORT   Patient Name:   DANN VENTRESS Date of Exam: 07/10/2021 Medical Rec #:  [id number]      Height:       67.0 in Accession #:    [id number]     Weight:       194.0 lb Date of Birth:  11/27/1940      BSA:          1.996 m Patient Age:    55 years       BP:           129/50 mmHg Patient Gender: F              HR:           75 bpm. Exam Location:  Inpatient Procedure: Cardiac Doppler, Color Doppler and Limited Echo Indications:     Pleuritic chest pain  History:         Patient has prior history of Echocardiogram examinations, most                  recent 06/13/2010. Pacemaker and CT which showed small right                  apical pneumothorax. Pleuritic chest pain post pacemaker                  yesterday.  Sonographer:     Merrie Roof RDCS Referring Phys:  [place] Diagnosing Phys: Sanda Klein MD IMPRESSIONS  1. Left ventricular ejection fraction, by estimation, is 60 to 65%. The left ventricle has normal function. The left ventricle has no regional wall motion abnormalities. Left ventricular diastolic parameters are consistent with Grade I diastolic dysfunction (impaired relaxation).  2. Right ventricular systolic function is normal. The right ventricular size is normal. There is normal pulmonary artery systolic pressure.  3. Left atrial size was mildly dilated.  4. The mitral valve is normal in structure. No evidence of mitral valve regurgitation. No evidence of mitral stenosis.  5. The aortic valve is tricuspid. Aortic valve regurgitation is not visualized. No aortic stenosis is present.  6. The inferior vena cava is normal in size with greater than 50% respiratory variability, suggesting right atrial pressure of 3 mmHg. FINDINGS  Left Ventricle: Left ventricular ejection fraction, by estimation, is 60 to 65%. The left ventricle has normal function. The left  ventricle has no regional wall motion abnormalities. The left ventricular internal cavity size was normal in size. There is  borderline concentric left ventricular hypertrophy. Left ventricular diastolic parameters are consistent with Grade I diastolic dysfunction (impaired relaxation). Normal left ventricular filling pressure. Right Ventricle: The right ventricular size is normal. No increase in right ventricular wall thickness. Right ventricular systolic function is normal. There is normal pulmonary artery systolic pressure. The tricuspid regurgitant velocity is 2.53 m/s, and  with an assumed right atrial pressure of 3 mmHg, the estimated right ventricular systolic pressure is 28.4 mmHg.  Left Atrium: Left atrial size was mildly dilated. Right Atrium: Right atrial size was normal in size. Pericardium: There is no evidence of pericardial effusion. Mitral Valve: The mitral valve is normal in structure. No evidence of mitral valve stenosis. Tricuspid Valve: The tricuspid valve is normal in structure. Tricuspid valve regurgitation is trivial. No evidence of tricuspid stenosis. Aortic Valve: The aortic valve is tricuspid. Aortic valve regurgitation is not visualized. No aortic stenosis is present. Pulmonic Valve: The pulmonic valve was not well visualized. Pulmonic valve regurgitation is not visualized. No evidence of pulmonic stenosis. Aorta: The aortic root is normal in size and structure. Venous: The inferior vena cava is normal in size with greater than 50% respiratory variability, suggesting right atrial pressure of 3 mmHg. IAS/Shunts: There is redundancy of the interatrial septum. No atrial level shunt detected by color flow Doppler. LEFT VENTRICLE PLAX 2D LVIDd:         4.50 cm Diastology LVIDs:         3.20 cm LV e' medial:    7.83 cm/s LV PW:         1.20 cm LV E/e' medial:  10.3 LV IVS:        1.00 cm LV e' lateral:   8.49 cm/s                        LV E/e' lateral: 9.5  RIGHT VENTRICLE RV Basal diam:  3.00  cm LEFT ATRIUM             Index        RIGHT ATRIUM           Index LA diam:        3.70 cm 1.85 cm/m   RA Area:     16.60 cm LA Vol (A2C):   95.1 ml 47.64 ml/m  RA Volume:   46.40 ml  23.24 ml/m LA Vol (A4C):   53.3 ml 26.70 ml/m LA Biplane Vol: 71.2 ml 35.67 ml/m  AORTIC VALVE LVOT Vmax:   136.00 cm/s LVOT Vmean:  93.800 cm/s LVOT VTI:    0.295 m  AORTA Ao Root diam: 3.20 cm MITRAL VALVE               TRICUSPID VALVE MV Area (PHT): 4.31 cm    TR Peak grad:   25.6 mmHg MV Decel Time: 176 msec    TR Vmax:        252.82 cm/s MV E velocity: 80.70 cm/s MV A velocity: 95.40 cm/s  SHUNTS MV E/A ratio:  0.85        Systemic VTI: 0.30 m Dani Gobble Croitoru MD Electronically signed by Sanda Klein MD Signature Date/Time: 07/10/2021/10:10:36 AM    Final (Updated)    Disposition   Pt is being discharged home today in good condition.  Follow-up Plans & Appointments     Follow-up Information     [facility] Follow up.   Specialty: Cardiology Why: [place] location - for wound check on Thursday Jul 19, 2021 at 12:00 PM. Please arrive 15 minutes early to check in. Contact information: [address] [place] [phone]        Sanda Klein, MD Follow up.   Specialty: Cardiology Why: [place] location - follow-up has been arranged with Dr. Sallyanne Kuster on Monday Oct 22, 2021 at 11:20 AM. Please arrive 15 minutes early to check in. We  will also be calling you for a sooner appointment in December. Contact information: [address] [place] [address] [phone]                Discharge Instructions     Diet - low sodium heart healthy   Complete by: As directed    Discharge instructions   Complete by: As directed    We have stopped two of your medicines, indapamide and clopidogrel. Do not throw these away as they might be restarted by your doctor at a later time - but  make sure to put them aside from the medicines you take every day.  We have sent in a prescription for pain medication called hydrocodone/acetaminophen. Please note that each tablet also contains 325mg  of the ingredient that is also found in regular Tylenol (acetaminophen). Just be aware of the content of acetaminophen when taking these medicines as you should not take more than 1000mg  total of acetaminophen every 6 hours, and no more than 4000mg  total in a day.     Dr. Sallyanne Kuster would suggest you continue isosorbide at the lower dose by cutting your 60mg  tablets in half. We sent an updated prescription to notify your pharmacy that we are telling you to do this.       If you develop any worsening symptoms of chest pain or shortness of breath, return to the ED.   Discharge wound care:   Complete by: As directed    Please see end of after-visit summary for pacemaker instructions.   Increase activity slowly   Complete by: As directed        Discharge Medications   Allergies as of 07/10/2021       Reactions   Codeine Phosphate Anaphylaxis, Shortness Of Breath, Swelling   Contrast Media [iodinated Diagnostic Agents] Swelling   Swelling of the face   Penicillin G Anaphylaxis   Patient denies allergy to penicillin.   Iodine Swelling, Rash   Many years ago. Topical iodine only.    Prednisone Other (See Comments)   Hyperactivity, insomnia   Statins Other (See Comments)   Hyperactivity   Tramadol Palpitations, Other (See Comments)   Hyperactivity        Medication List     STOP taking these medications    clopidogrel 75 MG tablet Commonly known as: PLAVIX   indapamide 2.5 MG tablet Commonly known as: LOZOL       TAKE these medications    acetaminophen 500 MG tablet Commonly known as: TYLENOL Take 1,000-1,500 mg by mouth every 6 (six) hours as needed for moderate pain.   carvedilol 6.25 MG tablet Commonly known as: COREG TAKE 1 TABLET BY MOUTH TWICE DAILY WITH A  MEAL. MAY TAKE AN EXTRA TABLET DAILY IF HAVING PALPATIONS What changed: See the new instructions.   cyanocobalamin 1000 MCG/ML injection Commonly known as: (VITAMIN B-12) Inject 1,000 mcg into the muscle every 30 (thirty) days.   diclofenac sodium 1 % Gel Commonly known as: VOLTAREN Apply 2 g topically 4 (four) times daily as needed (pain).   furosemide 80 MG tablet Commonly known as: LASIX TAKE 1 TABLET(80 MG) by once a day , may take an additional 80 mg if needed for swelling What changed:  how much to take how to take this when to take this additional instructions   HYDROcodone-acetaminophen 5-325 MG tablet Commonly known as: NORCO/VICODIN Take 1-2 tablets by mouth every 6 (six) hours as needed for moderate pain or severe pain.  ibuprofen 200 MG tablet Commonly known as: ADVIL Take 200-400 mg by mouth daily as needed for mild pain or moderate pain.   isosorbide mononitrate 60 MG 24 hr tablet Commonly known as: IMDUR Take 0.5 tablets (30 mg total) by mouth daily. What changed:  how much to take Another medication with the same name was removed. Continue taking this medication, and follow the directions you see here.   Lidocaine 4 % Ptch Apply 1 patch topically daily as needed (pain).   lidocaine 5 % ointment Commonly known as: XYLOCAINE Apply 1 application topically 3 (three) times daily as needed for mild pain (neuropathic pain in the feet).   losartan 50 MG tablet Commonly known as: COZAAR TAKE 1 TABLET EVERY DAY   nitroGLYCERIN 0.4 MG SL tablet Commonly known as: NITROSTAT DISSOLVE 1 TABLET UNDER THE TONGUE EVERY 5 MINUTES AS NEEDED FOR CHEST PAIN What changed:  how much to take how to take this when to take this reasons to take this additional instructions   spironolactone 25 MG tablet Commonly known as: ALDACTONE TAKE 1 TABLET BY MOUTH AS DIRECTED. TAKE 1 TABLET ON MONDAYS, WEDNESDAYS, AND FRIDAYS AT 6 PM. What changed: See the new instructions.    TEARS RENEWED OP Apply 1 drop to eye daily as needed (dry eyes).               Discharge Care Instructions  (From admission, onward)           Start     Ordered   07/10/21 0000  Discharge wound care:       Comments: Please see end of after-visit summary for pacemaker instructions.   07/10/21 1054               Outstanding Labs/Studies   N/A  Duration of Discharge Encounter   Greater than 30 minutes including physician time.  Signed, Charlie Pitter, PA-C 07/10/2021, 12:11 PM

## 2021-07-10 NOTE — Progress Notes (Signed)
Mobility Specialist Progress Note    07/10/21 1121  Mobility  Activity Ambulated in hall  Level of Assistance Minimal assist, patient does 75% or more  Assistive Device None  Distance Ambulated (ft) 240 ft  Mobility Ambulated independently in hallway  Mobility Response Tolerated well  Mobility performed by Mobility specialist  $Mobility charge 1 Mobility   Pre-Mobility: 89 HR, 97% SpO2 Post-Mobility: 93 HR  Pt received in bed and agreeable. C/o "wooziness" on walk and used the hand rails in the hall. Returned to bed with call bell in reach and RN present.   Hildred Alamin Mobility Specialist  Mobility Specialist Phone: [phone]

## 2021-07-12 ENCOUNTER — Telehealth: Payer: Self-pay | Admitting: *Deleted

## 2021-07-12 NOTE — Telephone Encounter (Signed)
Called the patient to make her a post pacemaker implant procedure appointment. She stated that she was doing "good" and had minimal pain. She was taking her sling off to bake pies and has been advised to keep her sling on and not to raise her left arm above her ear yet. She has verbalized her understanding.

## 2021-07-19 ENCOUNTER — Other Ambulatory Visit: Payer: Self-pay

## 2021-07-19 ENCOUNTER — Ambulatory Visit (INDEPENDENT_AMBULATORY_CARE_PROVIDER_SITE_OTHER): Payer: BLUE CROSS/BLUE SHIELD

## 2021-07-19 DIAGNOSIS — R55 Syncope and collapse: Secondary | ICD-10-CM

## 2021-07-19 LAB — CUP PACEART INCLINIC DEVICE CHECK
Battery Remaining Longevity: 180 mo
Battery Voltage: 3.21 V
Brady Statistic AP VP Percent: 0.06 %
Brady Statistic AP VS Percent: 6.03 %
Brady Statistic AS VP Percent: 0.03 %
Brady Statistic AS VS Percent: 93.89 %
Brady Statistic RA Percent Paced: 5.77 %
Brady Statistic RV Percent Paced: 0.18 %
Date Time Interrogation Session: 20221117123352
Implantable Lead Implant Date: 20221107
Implantable Lead Implant Date: 20221107
Implantable Lead Location: 753859
Implantable Lead Location: 753860
Implantable Lead Model: 5076
Implantable Lead Model: 5076
Implantable Pulse Generator Implant Date: 20221107
Lead Channel Impedance Value: 323 Ohm
Lead Channel Impedance Value: 437 Ohm
Lead Channel Impedance Value: 760 Ohm
Lead Channel Impedance Value: 855 Ohm
Lead Channel Pacing Threshold Amplitude: 0.5 V
Lead Channel Pacing Threshold Amplitude: 0.5 V
Lead Channel Pacing Threshold Pulse Width: 0.4 ms
Lead Channel Pacing Threshold Pulse Width: 0.4 ms
Lead Channel Sensing Intrinsic Amplitude: 11.5 mV
Lead Channel Sensing Intrinsic Amplitude: 11.625 mV
Lead Channel Sensing Intrinsic Amplitude: 2.375 mV
Lead Channel Sensing Intrinsic Amplitude: 2.625 mV
Lead Channel Setting Pacing Amplitude: 3.5 V
Lead Channel Setting Pacing Amplitude: 3.5 V
Lead Channel Setting Pacing Pulse Width: 0.4 ms
Lead Channel Setting Sensing Sensitivity: 1.2 mV

## 2021-07-19 NOTE — Progress Notes (Signed)
Wound check appointment. Steri-strips removed. Wound without redness or edema. Incision edges approximated, wound well healed. Normal device function. Thresholds, sensing, and impedances consistent with implant measurements. Device programmed at 3.5V for extra safety margin until 3 month visit. Histogram distribution appropriate for patient and level of activity. 2 AF episodes noted- Longest episode 07/13/21 ~10am, lasting 9+ hours, Average A/V rates 385/124, pt reports symptomatic experiencing dizziness and fatigue.  AF Burden 5.2%, patient is not currently on [place].  She reports previously being on Clopidogrel, that is currently on hold due to small Right pneumothorax.   Patient educated about wound care, arm mobility, lifting restrictions. Patient is enrolled in remote monitoring, next scheduled check 10/12/21.  ROV with Dr. Sallyanne Kuster on 08/10/21.

## 2021-07-19 NOTE — Patient Instructions (Signed)

## 2021-08-10 ENCOUNTER — Ambulatory Visit (INDEPENDENT_AMBULATORY_CARE_PROVIDER_SITE_OTHER): Payer: BLUE CROSS/BLUE SHIELD | Admitting: Cardiovascular Disease

## 2021-08-10 ENCOUNTER — Encounter: Payer: Self-pay | Admitting: Cardiovascular Disease

## 2021-08-10 ENCOUNTER — Other Ambulatory Visit: Payer: Self-pay

## 2021-08-10 VITALS — BP 144/56 | HR 88 | Ht 66.0 in | Wt 199.4 lb

## 2021-08-10 DIAGNOSIS — I10 Essential (primary) hypertension: Secondary | ICD-10-CM

## 2021-08-10 DIAGNOSIS — I441 Atrioventricular block, second degree: Secondary | ICD-10-CM | POA: Diagnosis not present

## 2021-08-10 DIAGNOSIS — I2510 Atherosclerotic heart disease of native coronary artery without angina pectoris: Secondary | ICD-10-CM

## 2021-08-10 DIAGNOSIS — Z9861 Coronary angioplasty status: Secondary | ICD-10-CM

## 2021-08-10 DIAGNOSIS — I701 Atherosclerosis of renal artery: Secondary | ICD-10-CM

## 2021-08-10 DIAGNOSIS — I25119 Atherosclerotic heart disease of native coronary artery with unspecified angina pectoris: Secondary | ICD-10-CM | POA: Diagnosis not present

## 2021-08-10 DIAGNOSIS — Z950 Presence of cardiac pacemaker: Secondary | ICD-10-CM

## 2021-08-10 NOTE — Patient Instructions (Addendum)
Medication Instructions:  No changes *If you need a refill on your cardiac medications before your next appointment, please call your pharmacy*   Lab Work: None ordered If you have labs (blood work) drawn today and your tests are completely normal, you will receive your results only by: [place] (if you have MyChart) OR A paper copy in the mail If you have any lab test that is abnormal or we need to change your treatment, we will call you to review the results.   Testing/Procedures: None ordered   Follow-Up: At [facility], you and your health needs are our priority.  As part of our continuing mission to provide you with exceptional heart care, we have created designated Provider Care Teams.  These Care Teams include your primary Cardiologist (physician) and Advanced Practice Providers (APPs -  Physician Assistants and Nurse Practitioners) who all work together to provide you with the care you need, when you need it.  We recommend signing up for the patient portal called "MyChart".  Sign up information is provided on this After Visit Summary.  MyChart is used to connect with patients for Virtual Visits (Telemedicine).  Patients are able to view lab/test results, encounter notes, upcoming appointments, etc.  Non-urgent messages can be sent to your provider as well.   To learn more about what you can do with MyChart, go to [email or web address].    Your next appointment:   Keep your scheduled follow up 10/22/21

## 2021-08-10 NOTE — Progress Notes (Signed)
Cardiology Office Note:    Date:  08/11/2021   ID:  [name], DOB [birth year], MRN [id number]  PCP:  Kristopher Glee., MD   [place] Providers Cardiologist:  Glenetta Hew, MD Electrophysiologist:  Sanda Klein, MD     Referring MD: Kristopher Glee., MD   Chief Complaint  Patient presents with   Atrial Fibrillation    History of Present Illness:    [name] is a 80 y.o. female with a hx of CAD s/p remote PCI (BMS RCA, overlapping BMS and DES mid LAD), chronic heart failure with preserved left ventricular ejection fraction, hyperlipidemia, type 2 diabetes mellitus, paroxysmal atrial tachycardia as well as intermittent episodes of second-degree AV block Mobitz type II for which she underwent implantation of a dual-chamber permanent pacemaker on July 09, 2021.  The pacemaker procedure (implanted in left subclavian area), was complicated by pleuritic chest pain and the chest x-ray showed a very small right-sided pneumothorax.  The suspicion was that she has an atrial lead perforation that actually tracked through the pericardium to the right pleura.  She did not require chest tube placement.  She has had gradual improvement in the pleuritic chest pain.  At her pacemaker wound check appointment on 07/19/2021 an episode of atrial fibrillation was detected.  It occurred on 07/13/2021 and lasted for about 9 hours, associated with some dizziness and fatigue.  There is no previous known history of atrial fibrillation, although atrial tachycardia has been recorded on event monitoring.  She remains very active.  The day after her pacemaker was implanted she started baking 28 cakes to deliver to the celebration that we can.  She very kindly brought [place] a cake today.  She denies problems with dizziness, lightheadedness or syncope.  She continues to have intermittent discomfort in her chest, but does not feel like her previous angina and is not clearly pleuritic.  She has not had lower  extremity edema and denies orthopnea or PND.  Pacemaker interrogation shows that she has not had any further episodes of atrial fibrillation since the event on November 11.  She has had 2 episodes of brief paroxysmal atrial tachycardia lasting 6 beats and 6 seconds respectively.  She has 10% atrial pacing and virtually no ventricular pacing.  Lead outputs are still programmed "high" until her 27-month appointment and even with those settings the estimated generator longevity is about 15 years.  Lead capture thresholds, sensing and impedances are all in normal range.  Her ECG shows normal sinus rhythm and she has fairly subtle T wave inversion leads III and aVF, very similar with ECG from November 7, before pacemaker implantation.  Past Medical History:  Diagnosis Date   Arthritis    "fingers, right shoulder" (09/09/2017)   Atrial fibrillation (HCC)    Back pain with radiation    Chronic Back Pain - mutliple surgeries (including tumor removal)   Bilateral edema of lower extremity    Chronic, likely related to venous stasis   Bradycardia    CAD S/P percutaneous coronary angioplasty    a) LHC: 07/30/10. -- 3.0x74mm Integrity BMS to pRCA & 2.5x 56mm Integrity BMS mLAD(@ D2).  b) Class III-IV Angina 03/2011: LHC- ISR in LAD BMS -- prox overlapping Promus DES 2.5x64mm and PTCA of jailed D2 ostium-prox 80%. c) 02/03/12:  LHC- patent stents.  Jailed diagonal. with stable flow; d) Peri-OP NSTEMI 04/2012 - LHC in 12/'13 -    Celiac artery stenosis ([place])    12/2016 - STENT placement  Complication of anesthesia    "used to wake up wild years ago" (09/09/2017)   Congestive heart failure with LV diastolic dysfunction, NYHA class 2 ([place])    06/13/10:  last 2D echo-  EF >55%, Mild TR, Mod Conc LVH - Grade 1 diastolic dysfunction (abnormal relaxation) --> LVEDP on Cath 28 mmHg & mild 2nd Pulm HTN   Diet-controlled type 2 diabetes mellitus ([place])    Diverticulitis of colon (without mention of hemorrhage)(562.11)     Dyslipidemia, goal LDL below 70    Intolerant to statins   GERD (gastroesophageal reflux disease)    Hepatitis ~ 1957   "yellow jaundice" (01/14/2017)   Hepatomegaly    Hiatal hernia    History of blood transfusion 04/2012   "when I had a heart attack"   History of kidney stones    "I've got a stone embedded in one of my kidneys" (09/09/2017)   History of stomach ulcers "years ago"   Irritable bowel syndrome (IBS)    Labile essential hypertension    Partially related to RAS   Mesenteric artery stenosis (HCC)    95% Celiac Artery - ostial, 20-30% SMA.  Bilateral Renal A: L RA stent patent, R RA 20-30% -- Conservative Management   Mobitz type 2 second degree AV block    NSTEMI (non-ST elevated myocardial infarction) ([place]) 04/2012   Unclear the details, apparently this was postoperative from her back surgery that she was cleared for my last saw her in June.  Reportedly had stents placed   Pancreas divisum    On pancreatic enzyme   PAT (paroxysmal atrial tachycardia) (HCC)    Renal artery stenosis ([place]) 2011; 12/'13   a) Angiogram 02/03/12:  50-60%L RA stenosis, 40% R R Inferior artery; b) 12/'13: S/P L RA Stent ([facility]) 6.0 mm x 15 mm; c) Renal Duplex 10/2013: <60% L RA, <60 R RA, ~60% SMA & Celiac A.   S/P placement of cardiac pacemaker 07/2021   Medtronic   Stricture and stenosis of esophagus    Unspecified gastritis and gastroduodenitis without mention of hemorrhage     Past Surgical History:  Procedure Laterality Date   APPENDECTOMY     BACK SURGERY     BREAST DUCTAL SYSTEM EXCISION     right   BREAST SURGERY Right    CARDIAC CATHETERIZATION N/A 03/16/2015   Procedure: Left Heart Cath and Coronary Angiography;  Surgeon: Leonie Man, MD;  Location: [place] [facility];  Service: Cardiovascular; Widely patent m-dLAD stents as wellas pRCA stent.  High LVEDP, small Diag & Om vessels with moderate stenosis    Cardiac Event Monitor  01/2017   Mostly NSR with occasional sinus  tachycardia and rare bradycardia. No A. fib. No PND or PSVT. Rare PACs and PVCs.   CATARACT EXTRACTION W/ INTRAOCULAR LENS  IMPLANT, BILATERAL Bilateral    CHOLECYSTECTOMY OPEN     COLONOSCOPY  03/01/2009   normal    CORONARY ANGIOPLASTY WITH STENT PLACEMENT  07/30/2010   3.0x62mm Integrity BMS to RCA and 2.5x 52mm Integrity BMS LAD.     CORONARY ANGIOPLASTY WITH STENT PLACEMENT  03/18/2011   Cutting Balloon PTCA of D2 (jailed - 80% ostial stenosis), DES PCI of mid LAD ISR - > Promus DES 2.5 x 16 mm postdilated to 2.8 mm) covering the proximal portion of the previous stent   DOBUTAMINE STRESS ECHO  03/07/2015   [facility] (ordered for pre-op evaluation for EUS/ERCP --> abnormal EKG:  strreesss test shhoowwed 1 mm  ST segment depressions downsloping. No wall motion abnormalities at peak exercise or at rest. Diastolic dysfunction was noted but normal systolic function - EF greater than 55%. No bouts of regurgitation or stenosis. Resting hypertension with exaggerated response   ESOPHAGOGASTRODUODENOSCOPY  04/08/2012   ESOPHAGOGASTRODUODENOSCOPY (EGD) WITH ESOPHAGEAL DILATION  X 2   FRACTURE SURGERY     HAMMER TOE SURGERY Bilateral    "took bone off the top of 2nd toe on each foot"   HIP SURGERY Left    "something to do w/my back"   JOINT REPLACEMENT     KNEE SURGERY Left    "had fluid drained off it a couple times"   LEFT HEART CATH AND CORONARY ANGIOGRAPHY  07/30/2010   severe LAD-diagonal 80%, moderate to severe proximal RCA.  Mean PAP 15 mmHg.  PCWP mean 17 mmHg.  RVP 45/11 mmHg.  PAP 47/24 mmHg, mean 31 mmHg.  LVEDP 27 mmHg   LEFT HEART CATHETERIZATION WITH CORONARY ANGIOGRAM N/A 02/03/2012   Procedure: LEFT HEART CATHETERIZATION WITH CORONARY ANGIOGRAM;  Surgeon: Leonie Man, MD;  Location: [facility] CATH LAB; widely patent LAD and RCA stents.  Patent D2 ostium.  Moderate L Renal A stenosis (56%), R Renal A 40-50% t.  LVEDP 20 mmHg.   LEFT HEART CATHETERIZATION WITH CORONARY ANGIOGRAM N/A 05/12/2014    Procedure: LEFT HEART CATHETERIZATION WITH CORONARY ANGIOGRAM;  Surgeon: Lorretta Harp, MD;  Location: [facility] CATH LAB;  Service: Cardiovascular: Stable CAD. Patent stents. Patent renal artery stent   LEFT HEART CATHETERIZATION WITH CORONARY ANGIOGRAM  08/2012   Peri-Op MI @ [facility] -- 40% ostial D1, patent LAD stents, 10% RCA ISR   LEFT HEART CATHETERIZATION WITH CORONARY ANGIOGRAM   03/18/2011   70% ISR of LAD stent just after D2 (D2 has ostial 80 to 90% stenosis.  20 to 30% ISR RCA.  EDP elevated at 28 mmHg   NM MYOVIEW LTD  12/2016   LOW RISK study. No ischemia or infarction. EF 65-75%.   NM MYOVIEW LTD  01/05/2020    LOW RISK. EF 60-65%.  No EKG changes.  No infarct, no ischemia.   PACEMAKER IMPLANT N/A 07/09/2021   Procedure: PACEMAKER IMPLANT;  Surgeon: Sanda Klein, MD;  Location: [place] [facility];  Service: Cardiovascular;  Laterality: N/A;   PANCREAS SURGERY     "stent in my pancreas"; Dr Pershing Proud   PERCUTANEOUS PINNING PHALANX FRACTURE OF HAND Left ~ 2013   PERIPHERAL VASCULAR BALLOON ANGIOPLASTY  09/09/2017   Procedure: PERIPHERAL VASCULAR BALLOON ANGIOPLASTY;  Surgeon: Serafina Mitchell, MD;  Location: [place] [facility];  Service: Cardiovascular;;  Celiac instent   PERIPHERAL VASCULAR INTERVENTION  01/14/2017   Procedure: Peripheral Vascular Intervention;  Surgeon: Serafina Mitchell, MD;  Location: [place] [facility];  Service: Cardiovascular;;  mesentric   RENAL ARTERY STENT Left 08/2012   @ [facility] - 6.0 mm x 15 mm   SPINE SURGERY     tumor removed 07/2010; Redo Surgery 04/2012; Sacroiliac Sgx 08/2013   TOTAL ABDOMINAL HYSTERECTOMY     TOTAL KNEE ARTHROPLASTY Left ~ 2012   VISCERAL ANGIOGRAM N/A 05/12/2014   Procedure: VISCERAL ANGIOGRAM;  Surgeon: Lorretta Harp, MD;  Location: [facility] C[facility];  25% ostial proximal celiac artery with downward takeoff.  23% proximal SMA and 56% proximal IMA.  Left renal artery stent widely patent.  Right renal artery is 20  to 30% proximal stenosis.   VISCERAL ANGIOGRAPHY N/A 01/14/2017  Procedure: Mesenteric  Angiography;  Surgeon: Serafina Mitchell, MD;  Location: [place] [facility];  Service: Cardiovascular;  Laterality: N/A;   VISCERAL ANGIOGRAPHY N/A 09/09/2017   Procedure: VISCERAL ANGIOGRAPHY;  Surgeon: Serafina Mitchell, MD;  Location: [place] [facility];  Service: Cardiovascular;  Laterality: N/A;    Current Medications: Current Meds  Medication Sig   acetaminophen (TYLENOL) 500 MG tablet Take 1,000-1,500 mg by mouth every 6 (six) hours as needed for moderate pain.   carvedilol (COREG) 6.25 MG tablet TAKE 1 TABLET BY MOUTH TWICE DAILY WITH A MEAL. MAY TAKE AN EXTRA TABLET DAILY IF HAVING PALPATIONS (Patient taking differently: Take 6.25 mg by mouth 2 (two) times daily with a meal.)   clopidogrel (PLAVIX) 75 MG tablet Take 75 mg by mouth daily.   cyanocobalamin (,VITAMIN B-12,) 1000 MCG/ML injection Inject 1,000 mcg into the muscle every 30 (thirty) days.   furosemide (LASIX) 80 MG tablet TAKE 1 TABLET(80 MG) by once a day , may take an additional 80 mg if needed for swelling (Patient taking differently: Take 80 mg by mouth 2 (two) times daily with a meal.)   ibuprofen (ADVIL) 200 MG tablet Take 200-400 mg by mouth daily as needed for mild pain or moderate pain.   isosorbide mononitrate (IMDUR) 60 MG 24 hr tablet Take 0.5 tablets (30 mg total) by mouth daily.   Lidocaine 4 % PTCH Apply 1 patch topically daily as needed (pain).   losartan (COZAAR) 50 MG tablet TAKE 1 TABLET EVERY DAY (Patient taking differently: Take 50 mg by mouth daily.)   spironolactone (ALDACTONE) 25 MG tablet TAKE 1 TABLET BY MOUTH AS DIRECTED. TAKE 1 TABLET ON MONDAYS, WEDNESDAYS, AND FRIDAYS AT 6 PM. (Patient taking differently: Take 25 mg by mouth every Monday, Wednesday, and Friday.)     Allergies:   Codeine phosphate, Contrast media [iodinated diagnostic agents], Penicillin g, Iodine, Prednisone, Statins, and Tramadol   Social  History   Socioeconomic History   Marital status: Married    Spouse name: Not on file   Number of children: 1   Years of education: Not on file   Highest education level: Not on file  Occupational History   Occupation: gift shop owner  Tobacco Use   Smoking status: Never   Smokeless tobacco: Never  Vaping Use   Vaping Use: Never used  Substance and Sexual Activity   Alcohol use: No   Drug use: No   Sexual activity: Not Currently  Other Topics Concern   Not on file  Social History Narrative   Married mother of one, one grandchild.   Very socially active, enjoys cooking and having get-togethers her house. She had been exercising regularly but her back pain limits her.   Does not smoke, does not drink alcohol.   Social Determinants of Health   Financial Resource Strain: Not on file  Food Insecurity: Not on file  Transportation Needs: Not on file  Physical Activity: Not on file  Stress: Not on file  Social Connections: Not on file     Family History: The patient's family history includes Cancer in her father; Colitis in her maternal grandfather; Diabetes in her brother; Heart attack in her brother, brother, brother, and father; Heart disease in her father; Stroke in her brother and mother. There is no history of Colon cancer or Stomach cancer.  ROS:   Please see the history of present illness.     All other systems reviewed and are negative.  EKGs/Labs/Other Studies  Reviewed:    The following studies were reviewed today: Comprehensive pacemaker interrogation, see above.  EKG:  EKG is  ordered today.  The ekg ordered today demonstrates normal sinus rhythm, subtle T wave inversion in the inferior leads  Recent Labs: 06/20/2021: BUN 22; Creatinine, Ser 0.85; Hemoglobin 12.4; Platelets 306; Potassium 4.6; Sodium 139  Recent Lipid Panel    Component Value Date/Time   CHOL 245 (H) 09/08/2019 1045   TRIG 305 (H) 09/08/2019 1045   HDL 36 (L) 09/08/2019 1045   CHOLHDL 6.8  (H) 09/08/2019 1045   CHOLHDL 6.4 (H) 05/07/2016 1018   VLDL 61 (H) 05/07/2016 1018   LDLCALC 152 (H) 09/08/2019 1045     Risk Assessment/Calculations:    CHA2DS2-VASc Score = 6   This indicates a 9.7% annual risk of stroke. The patient's score is based upon: CHF History: 1 HTN History: 1 Diabetes History: 0 Stroke History: 0 Vascular Disease History: 1 Age Score: 2 Gender Score: 1         Physical Exam:    VS:  BP (!) 144/56 (BP Location: Left Arm, Patient Position: Sitting, Cuff Size: Large)   Pulse 88   Ht 5\' 6"  (1.676 m)   Wt 199 lb 6.4 oz (90.4 kg)   SpO2 96%   BMI 32.18 kg/m     Wt Readings from Last 3 Encounters:  08/10/21 199 lb 6.4 oz (90.4 kg)  07/09/21 194 lb (88 kg)  06/20/21 200 lb 6.4 oz (90.9 kg)     GEN: Appears well.  Well nourished, well developed in no acute distress.  The pacemaker site has healed very nicely HEENT: Normal NECK: No JVD; No carotid bruits LYMPHATICS: No lymphadenopathy CARDIAC: RRR, no murmurs, rubs, gallops RESPIRATORY:  Clear to auscultation without rales, wheezing or rhonchi  ABDOMEN: Soft, non-tender, non-distended MUSCULOSKELETAL:  No edema; No deformity  SKIN: Warm and dry NEUROLOGIC:  Alert and oriented x 3 PSYCHIATRIC:  Normal affect   ASSESSMENT:    1. Coronary artery disease involving native coronary artery of native heart with angina pectoris ([place])   2. CAD S/P percutaneous coronary angioplasty: pRCA BMS, mLAD BMS overlapped prox with DES for ISR   3. Essential hypertension   4. Mobitz type 2 second degree atrioventricular block   5. Pacemaker    PLAN:    In order of problems listed above:  AFib: This occurred roughly 4 days after her pacemaker implantation, a procedure that was complicated by a small right pneumothorax and pleuritic chest pain.  Since the pacemaker was implanted from the left subclavian approach, the most likely explanation for her right pneumothorax is perforation of the atrial lead  through the pericardium and the pleura into the right lung.  Very likely there was also a component of acute pericarditis.  It is possible that her arrhythmia was related to the acute inflammatory process.  There has been no atrial fibrillation in the next month of follow-up.  Have programmed her pacemaker to alert [place] for any episodes of atrial fibrillation that lasts for more than 1 hour.  If these were to occur now, more than 30 days since pacemaker implantation, they likely represent episodes of paroxysmal atrial fibrillation independent of acute pericarditis.  Her CHA2DS2-VASc score is high and she is at high embolic risk.  If we detect atrial fibrillation again we will start Eliquis and probably stop her clopidogrel. CAD: ECG is unchanged.  Her chest pain is not exertional and does not sound like angina pectoris.  She is currently on clopidogrel therapy due to overlapping stents in the LAD artery.  Intolerant to statins.  Dr. Ellyn Hack will be discussing alternative treatments for her hypercholesterolemia. HTN: Fair control.  She has a very wide pulse pressure and we may have to tolerate a little bit of systolic hypertension to avoid symptoms of diastolic hypotension. 2nd deg AVB: This was intermittent on her monitor.  So far she has required very little ventricular pacing. Pacemaker: Despite the complications at the time of device implantation, both leads have excellent parameters and the device is functioning normally.  She will be coming back another couple of months at which time we will decrease the lead outputs to chronic settings, reevaluate the prevalence and type of atrial arrhythmias and decide on anticoagulant therapy.           Medication Adjustments/Labs and Tests Ordered: Current medicines are reviewed at length with the patient today.  Concerns regarding medicines are outlined above.  Orders Placed This Encounter  Procedures   EKG 12-Lead   No orders of the defined types were  placed in this encounter.   Patient Instructions  Medication Instructions:  No changes *If you need a refill on your cardiac medications before your next appointment, please call your pharmacy*   Lab Work: None ordered If you have labs (blood work) drawn today and your tests are completely normal, you will receive your results only by: [place] (if you have MyChart) OR A paper copy in the mail If you have any lab test that is abnormal or we need to change your treatment, we will call you to review the results.   Testing/Procedures: None ordered   Follow-Up: At [facility], you and your health needs are our priority.  As part of our continuing mission to provide you with exceptional heart care, we have created designated Provider Care Teams.  These Care Teams include your primary Cardiologist (physician) and Advanced Practice Providers (APPs -  Physician Assistants and Nurse Practitioners) who all work together to provide you with the care you need, when you need it.  We recommend signing up for the patient portal called "MyChart".  Sign up information is provided on this After Visit Summary.  MyChart is used to connect with patients for Virtual Visits (Telemedicine).  Patients are able to view lab/test results, encounter notes, upcoming appointments, etc.  Non-urgent messages can be sent to your provider as well.   To learn more about what you can do with MyChart, go to [email or web address].    Your next appointment:   Keep your scheduled follow up 10/22/21   Signed, Sanda Klein, MD  08/11/2021 1:17 PM    [facility]

## 2021-08-11 ENCOUNTER — Encounter: Payer: Self-pay | Admitting: Cardiovascular Disease

## 2021-08-14 DIAGNOSIS — M5414 Radiculopathy, thoracic region: Secondary | ICD-10-CM | POA: Diagnosis not present

## 2021-09-04 DIAGNOSIS — M2042 Other hammer toe(s) (acquired), left foot: Secondary | ICD-10-CM | POA: Diagnosis not present

## 2021-09-04 DIAGNOSIS — B351 Tinea unguium: Secondary | ICD-10-CM | POA: Diagnosis not present

## 2021-09-04 DIAGNOSIS — E1142 Type 2 diabetes mellitus with diabetic polyneuropathy: Secondary | ICD-10-CM | POA: Diagnosis not present

## 2021-09-04 DIAGNOSIS — L84 Corns and callosities: Secondary | ICD-10-CM | POA: Diagnosis not present

## 2021-09-04 DIAGNOSIS — Z20822 Contact with and (suspected) exposure to covid-19: Secondary | ICD-10-CM | POA: Diagnosis not present

## 2021-09-04 DIAGNOSIS — M2041 Other hammer toe(s) (acquired), right foot: Secondary | ICD-10-CM | POA: Diagnosis not present

## 2021-09-04 DIAGNOSIS — M2011 Hallux valgus (acquired), right foot: Secondary | ICD-10-CM | POA: Diagnosis not present

## 2021-09-04 DIAGNOSIS — M2012 Hallux valgus (acquired), left foot: Secondary | ICD-10-CM | POA: Diagnosis not present

## 2021-10-01 ENCOUNTER — Other Ambulatory Visit: Payer: Self-pay | Admitting: Cardiology

## 2021-10-04 ENCOUNTER — Other Ambulatory Visit: Payer: Self-pay | Admitting: Cardiology

## 2021-10-12 ENCOUNTER — Ambulatory Visit (INDEPENDENT_AMBULATORY_CARE_PROVIDER_SITE_OTHER): Payer: Medicare Other

## 2021-10-12 ENCOUNTER — Telehealth: Payer: Self-pay | Admitting: Cardiology

## 2021-10-12 DIAGNOSIS — I441 Atrioventricular block, second degree: Secondary | ICD-10-CM

## 2021-10-12 MED ORDER — FUROSEMIDE 80 MG PO TABS: ORAL_TABLET | ORAL | 3 refills | Status: DC

## 2021-10-12 NOTE — Telephone Encounter (Signed)
°*  STAT* If patient is at the pharmacy, call can be transferred to refill team.   1. Which medications need to be refilled? (please list name of each medication and dose if known) Furosemide  2. Which pharmacy/location (including street and city if local pharmacy) is medication to be sent to?[facility] [place]  3. Do they need a 30 day or 90 day supply? 90 days and refills

## 2021-10-12 NOTE — Telephone Encounter (Signed)
Called and left a voice message for patient informing her that the Furosemide (Lasix) was sent to [facility] [place] [place] and to give our office a call if there are any questions or issues with her Rx refill.

## 2021-10-13 LAB — CUP PACEART REMOTE DEVICE CHECK
Battery Remaining Longevity: 179 mo
Battery Voltage: 3.19 V
Brady Statistic AP VP Percent: 0.03 %
Brady Statistic AP VS Percent: 5.91 %
Brady Statistic AS VP Percent: 0.03 %
Brady Statistic AS VS Percent: 94.03 %
Brady Statistic RA Percent Paced: 5.94 %
Brady Statistic RV Percent Paced: 0.05 %
Date Time Interrogation Session: 20230210161317
Implantable Lead Implant Date: 20221107
Implantable Lead Implant Date: 20221107
Implantable Lead Location: 753859
Implantable Lead Location: 753860
Implantable Lead Model: 5076
Implantable Lead Model: 5076
Implantable Pulse Generator Implant Date: 20221107
Lead Channel Impedance Value: 304 Ohm
Lead Channel Impedance Value: 380 Ohm
Lead Channel Impedance Value: 608 Ohm
Lead Channel Impedance Value: 874 Ohm
Lead Channel Pacing Threshold Amplitude: 0.5 V
Lead Channel Pacing Threshold Amplitude: 0.5 V
Lead Channel Pacing Threshold Pulse Width: 0.4 ms
Lead Channel Pacing Threshold Pulse Width: 0.4 ms
Lead Channel Sensing Intrinsic Amplitude: 11.25 mV
Lead Channel Sensing Intrinsic Amplitude: 11.25 mV
Lead Channel Sensing Intrinsic Amplitude: 2.25 mV
Lead Channel Sensing Intrinsic Amplitude: 2.25 mV
Lead Channel Setting Pacing Amplitude: 1.5 V
Lead Channel Setting Pacing Amplitude: 2 V
Lead Channel Setting Pacing Pulse Width: 0.4 ms
Lead Channel Setting Sensing Sensitivity: 1.2 mV

## 2021-10-16 NOTE — Progress Notes (Signed)
Remote pacemaker transmission.   

## 2021-10-22 ENCOUNTER — Telehealth: Payer: Self-pay

## 2021-10-22 ENCOUNTER — Other Ambulatory Visit: Payer: Self-pay | Admitting: Cardiovascular Disease

## 2021-10-22 ENCOUNTER — Encounter: Payer: Self-pay | Admitting: Cardiovascular Disease

## 2021-10-22 ENCOUNTER — Ambulatory Visit (INDEPENDENT_AMBULATORY_CARE_PROVIDER_SITE_OTHER): Payer: Medicare Other | Admitting: Cardiovascular Disease

## 2021-10-22 ENCOUNTER — Other Ambulatory Visit: Payer: Self-pay

## 2021-10-22 VITALS — BP 122/56 | HR 79 | Ht 67.0 in | Wt 202.0 lb

## 2021-10-22 DIAGNOSIS — I471 Supraventricular tachycardia: Secondary | ICD-10-CM | POA: Diagnosis not present

## 2021-10-22 DIAGNOSIS — I480 Paroxysmal atrial fibrillation: Secondary | ICD-10-CM | POA: Diagnosis not present

## 2021-10-22 DIAGNOSIS — I10 Essential (primary) hypertension: Secondary | ICD-10-CM | POA: Diagnosis not present

## 2021-10-22 DIAGNOSIS — I25119 Atherosclerotic heart disease of native coronary artery with unspecified angina pectoris: Secondary | ICD-10-CM

## 2021-10-22 DIAGNOSIS — I441 Atrioventricular block, second degree: Secondary | ICD-10-CM | POA: Diagnosis not present

## 2021-10-22 DIAGNOSIS — Z950 Presence of cardiac pacemaker: Secondary | ICD-10-CM | POA: Diagnosis not present

## 2021-10-22 MED ORDER — APIXABAN 5 MG PO TABS: 5.0000 mg | ORAL_TABLET | Freq: Two times a day (BID) | ORAL | 11 refills | Status: DC

## 2021-10-22 NOTE — Telephone Encounter (Signed)
-----   Message from Benedetto Goad, RN sent at 10/22/2021  3:49 PM EST ----- Can you please help this patient with her remote monitoring. Thank you

## 2021-10-22 NOTE — Progress Notes (Signed)
Cardiology Office Note:    Date:  10/22/2021   ID:  [name], DOB 1941/02/19, MRN [id number]  PCP:  Kristopher Glee., MD   [place] [facility] Cardiologist:  Glenetta Hew, MD Electrophysiologist:  Sanda Klein, MD     Referring MD: Kristopher Glee., MD   Chief Complaint  Patient presents with   Pacemaker Check    History of Present Illness:    [name] is a 81 y.o. female with a hx of CAD s/p remote PCI (BMS RCA, overlapping BMS and DES mid LAD), chronic heart failure with preserved left ventricular ejection fraction, hyperlipidemia, type 2 diabetes mellitus, paroxysmal atrial tachycardia as well as intermittent episodes of second-degree AV block Mobitz type II for which she underwent implantation of a dual-chamber permanent pacemaker on July 09, 2021.  The pacemaker procedure (implanted in left subclavian area), was complicated by pleuritic chest pain and the chest x-ray showed a very small right-sided pneumothorax.  The suspicion was that she has an atrial lead perforation that actually tracked through the pericardium to the right pleura.  She did not require chest tube placement.  She has had gradual improvement in the pleuritic chest pain. At her pacemaker wound check appointment on 07/19/2021 an episode of atrial fibrillation was detected.  It occurred on 07/13/2021 and lasted for about 9 hours, associated with some dizziness and fatigue.  There is no previous known history of atrial fibrillation, although atrial tachycardia has been recorded on event monitoring.  After the events of November, her pacemaker has recorded brief atrial tachycardia spells which she perceives as palpitations, but until the last few days we have not seen atrial fibrillation.  On 10/16/2021 she had approximately 12 hours of paroxysmal atrial fibrillation with controlled ventricular rates and again she had about 10 hours of atrial fibrillation on 10/20/2021.  Both episodes were symptomatic,  perceived as "heart going crazy" and making her sit down and rest.  Outside of these events she remains physically active and enjoys baking.  She has not had syncope, orthopnea, PND, edema.  She has episodes where "her heart hurts" but it is hard to say whether she is truly describing angina pectoris.  Other than the episodes of atrial fibrillation, her pacemaker check is otherwise unremarkable.  Leads are now programmed to chronic outputs.  Pacing thresholds, sensing and lead impedances are all normal.  She only has about 6% atrial pacing and virtually no ventricular pacing.  Comprehensive pacemaker interrogation and reprogramming of lead outputs to chronic levels was performed today.  Past Medical History:  Diagnosis Date   Arthritis    "fingers, right shoulder" (09/09/2017)   Atrial fibrillation (HCC)    Back pain with radiation    Chronic Back Pain - mutliple surgeries (including tumor removal)   Bilateral edema of lower extremity    Chronic, likely related to venous stasis   Bradycardia    CAD S/P percutaneous coronary angioplasty    a) LHC: 07/30/10. -- 3.0x24mm Integrity BMS to pRCA & 2.5x 70mm Integrity BMS mLAD(@ D2).  b) Class III-IV Angina 03/2011: LHC- ISR in LAD BMS -- prox overlapping Promus DES 2.5x64mm and PTCA of jailed D2 ostium-prox 80%. c) 02/03/12:  LHC- patent stents.  Jailed diagonal. with stable flow; d) Peri-OP NSTEMI 04/2012 - LHC in 12/'13 -    Celiac artery stenosis ([place])    12/2016 - STENT placement   Complication of anesthesia    "used to wake up wild years ago" (09/09/2017)  Congestive heart failure with LV diastolic dysfunction, NYHA class 2 ([place])    06/13/10:  last 2D echo-  EF >55%, Mild TR, Mod Conc LVH - Grade 1 diastolic dysfunction (abnormal relaxation) --> LVEDP on Cath 28 mmHg & mild 2nd Pulm HTN   Diet-controlled type 2 diabetes mellitus ([place])    Diverticulitis of colon (without mention of hemorrhage)(562.11)    Dyslipidemia, goal LDL below 70     Intolerant to statins   GERD (gastroesophageal reflux disease)    Hepatitis ~ 1957   "yellow jaundice" (01/14/2017)   Hepatomegaly    Hiatal hernia    History of blood transfusion 04/2012   "when I had a heart attack"   History of kidney stones    "I've got a stone embedded in one of my kidneys" (09/09/2017)   History of stomach ulcers "years ago"   Irritable bowel syndrome (IBS)    Labile essential hypertension    Partially related to RAS   Mesenteric artery stenosis (HCC)    95% Celiac Artery - ostial, 20-30% SMA.  Bilateral Renal A: L RA stent patent, R RA 20-30% -- Conservative Management   Mobitz type 2 second degree AV block    NSTEMI (non-ST elevated myocardial infarction) ([place]) 04/2012   Unclear the details, apparently this was postoperative from her back surgery that she was cleared for my last saw her in June.  Reportedly had stents placed   Pancreas divisum    On pancreatic enzyme   PAT (paroxysmal atrial tachycardia) (HCC)    Renal artery stenosis ([place]) 2011; 12/'13   a) Angiogram 02/03/12:  50-60%L RA stenosis, 40% R R Inferior artery; b) 12/'13: S/P L RA Stent ([facility]) 6.0 mm x 15 mm; c) Renal Duplex 10/2013: <60% L RA, <60 R RA, ~60% SMA & Celiac A.   S/P placement of cardiac pacemaker 07/2021   [employer]   Stricture and stenosis of esophagus    Unspecified gastritis and gastroduodenitis without mention of hemorrhage     Past Surgical History:  Procedure Laterality Date   APPENDECTOMY     BACK SURGERY     BREAST DUCTAL SYSTEM EXCISION     right   BREAST SURGERY Right    CARDIAC CATHETERIZATION N/A 03/16/2015   Procedure: Left Heart Cath and Coronary Angiography;  Surgeon: Leonie Man, MD;  Location: [facility];  Service: Cardiovascular; Widely patent m-dLAD stents as wellas pRCA stent.  High LVEDP, small Diag & Om vessels with moderate stenosis    Cardiac Event Monitor  01/2017   Mostly NSR with occasional sinus tachycardia and rare bradycardia. No A.  fib. No PND or PSVT. Rare PACs and PVCs.   CATARACT EXTRACTION W/ INTRAOCULAR LENS  IMPLANT, BILATERAL Bilateral    CHOLECYSTECTOMY OPEN     COLONOSCOPY  03/01/2009   normal    CORONARY ANGIOPLASTY WITH STENT PLACEMENT  07/30/2010   3.0x39mm Integrity BMS to RCA and 2.5x 7mm Integrity BMS LAD.     CORONARY ANGIOPLASTY WITH STENT PLACEMENT  03/18/2011   Cutting Balloon PTCA of D2 (jailed - 80% ostial stenosis), DES PCI of mid LAD ISR - > Promus DES 2.5 x 16 mm postdilated to 2.8 mm) covering the proximal portion of the previous stent   DOBUTAMINE STRESS ECHO  03/07/2015   [facility] (ordered for pre-op evaluation for EUS/ERCP --> abnormal EKG:  strreesss test shhoowwed 1 mm ST segment depressions downsloping. No wall motion abnormalities at peak exercise or at rest. Diastolic dysfunction  was noted but normal systolic function - EF greater than 55%. No bouts of regurgitation or stenosis. Resting hypertension with exaggerated response   ESOPHAGOGASTRODUODENOSCOPY  04/08/2012   ESOPHAGOGASTRODUODENOSCOPY (EGD) WITH ESOPHAGEAL DILATION  X 2   FRACTURE SURGERY     HAMMER TOE SURGERY Bilateral    "took bone off the top of 2nd toe on each foot"   HIP SURGERY Left    "something to do w/my back"   JOINT REPLACEMENT     KNEE SURGERY Left    "had fluid drained off it a couple times"   LEFT HEART CATH AND CORONARY ANGIOGRAPHY  07/30/2010   severe LAD-diagonal 80%, moderate to severe proximal RCA.  Mean PAP 15 mmHg.  PCWP mean 17 mmHg.  RVP 45/11 mmHg.  PAP 47/24 mmHg, mean 31 mmHg.  LVEDP 27 mmHg   LEFT HEART CATHETERIZATION WITH CORONARY ANGIOGRAM N/A 02/03/2012   Procedure: LEFT HEART CATHETERIZATION WITH CORONARY ANGIOGRAM;  Surgeon: Leonie Man, MD;  Location: [facility] CATH LAB; widely patent LAD and RCA stents.  Patent D2 ostium.  Moderate L Renal A stenosis (56%), R Renal A 40-50% t.  LVEDP 20 mmHg.   LEFT HEART CATHETERIZATION WITH CORONARY ANGIOGRAM N/A 05/12/2014   Procedure: LEFT HEART CATHETERIZATION WITH  CORONARY ANGIOGRAM;  Surgeon: Lorretta Harp, MD;  Location: [facility] CATH LAB;  Service: Cardiovascular: Stable CAD. Patent stents. Patent renal artery stent   LEFT HEART CATHETERIZATION WITH CORONARY ANGIOGRAM  08/2012   Peri-Op MI @ [facility] -- 40% ostial D1, patent LAD stents, 10% RCA ISR   LEFT HEART CATHETERIZATION WITH CORONARY ANGIOGRAM   03/18/2011   70% ISR of LAD stent just after D2 (D2 has ostial 80 to 90% stenosis.  20 to 30% ISR RCA.  EDP elevated at 28 mmHg   NM MYOVIEW LTD  12/2016   LOW RISK study. No ischemia or infarction. EF 65-75%.   NM MYOVIEW LTD  01/05/2020    LOW RISK. EF 60-65%.  No EKG changes.  No infarct, no ischemia.   PACEMAKER IMPLANT N/A 07/09/2021   Procedure: PACEMAKER IMPLANT;  Surgeon: Sanda Klein, MD;  Location: [facility];  Service: Cardiovascular;  Laterality: N/A;   PANCREAS SURGERY     "stent in my pancreas"; Dr Pershing Proud   PERCUTANEOUS PINNING PHALANX FRACTURE OF HAND Left ~ 2013   PERIPHERAL VASCULAR BALLOON ANGIOPLASTY  09/09/2017   Procedure: PERIPHERAL VASCULAR BALLOON ANGIOPLASTY;  Surgeon: Serafina Mitchell, MD;  Location: [facility];  Service: Cardiovascular;;  Celiac instent   PERIPHERAL VASCULAR INTERVENTION  01/14/2017   Procedure: Peripheral Vascular Intervention;  Surgeon: Serafina Mitchell, MD;  Location: [facility];  Service: Cardiovascular;;  mesentric   RENAL ARTERY STENT Left 08/2012   @ [facility] - 6.0 mm x 15 mm   SPINE SURGERY     tumor removed 07/2010; Redo Surgery 04/2012; Sacroiliac Sgx 08/2013   TOTAL ABDOMINAL HYSTERECTOMY     TOTAL KNEE ARTHROPLASTY Left ~ 2012   VISCERAL ANGIOGRAM N/A 05/12/2014   Procedure: VISCERAL ANGIOGRAM;  Surgeon: Lorretta Harp, MD;  Location: [facility] CATH LAB;  25% ostial proximal celiac artery with downward takeoff.  23% proximal SMA and 56% proximal IMA.  Left renal artery stent widely patent.  Right renal artery is 20 to 30% proximal stenosis.   VISCERAL  ANGIOGRAPHY N/A 01/14/2017   Procedure: Mesenteric  Angiography;  Surgeon: Serafina Mitchell, MD;  Location[facility] INVASIVE CV  LAB;  Service: Cardiovascular;  Laterality: N/A;   VISCERAL ANGIOGRAPHY N/A 09/09/2017   Procedure: VISCERAL ANGIOGRAPHY;  Surgeon: Serafina Mitchell, MD;  Location: [place] [facility];  Service: Cardiovascular;  Laterality: N/A;    Current Medications: Current Meds  Medication Sig   acetaminophen (TYLENOL) 500 MG tablet Take 1,000-1,500 mg by mouth every 6 (six) hours as needed for moderate pain.   apixaban (ELIQUIS) 5 MG TABS tablet Take 1 tablet (5 mg total) by mouth 2 (two) times daily.   Artificial Tear Solution (TEARS RENEWED OP) Apply 1 drop to eye daily as needed (dry eyes).   carvedilol (COREG) 6.25 MG tablet TAKE 1 TABLET BY MOUTH TWICE DAILY WITH A MEAL. MAY TAKE AN EXTRA TABLET DAILY IF HAVING PALPATIONS (Patient taking differently: Take 6.25 mg by mouth 2 (two) times daily with a meal.)   clopidogrel (PLAVIX) 75 MG tablet TAKE 1 TABLET(75 MG) BY MOUTH DAILY   cyanocobalamin (,VITAMIN B-12,) 1000 MCG/ML injection Inject 1,000 mcg into the muscle every 30 (thirty) days.   furosemide (LASIX) 80 MG tablet TAKE 1 TABLET(80 MG) by mouth once a day , may take an additional 80 mg if needed for swelling   ibuprofen (ADVIL) 200 MG tablet Take 200-400 mg by mouth daily as needed for mild pain or moderate pain.   indapamide (LOZOL) 2.5 MG tablet Take 2.5 mg by mouth daily.   isosorbide mononitrate (IMDUR) 30 MG 24 hr tablet Take 30 mg by mouth daily.   lidocaine (XYLOCAINE) 5 % ointment Apply 1 application topically 3 (three) times daily as needed for mild pain (neuropathic pain in the feet).   losartan (COZAAR) 50 MG tablet Take 1 tablet (50 mg total) by mouth daily.   spironolactone (ALDACTONE) 25 MG tablet TAKE 1 TABLET BY MOUTH AS DIRECTED. TAKE 1 TABLET ON MONDAYS, WEDNESDAYS, AND FRIDAYS AT 6 PM. (Patient taking differently: Take 25 mg by mouth every Monday, Wednesday,  and Friday.)   [DISCONTINUED] isosorbide mononitrate (IMDUR) 60 MG 24 hr tablet Take 0.5 tablets (30 mg total) by mouth daily.     Allergies:   Codeine phosphate, Contrast media [iodinated contrast media], Penicillin g, Iodine, Prednisone, Statins, and Tramadol   Social History   Socioeconomic History   Marital status: Married    Spouse name: Not on file   Number of children: 1   Years of education: Not on file   Highest education level: Not on file  Occupational History   Occupation: gift shop owner  Tobacco Use   Smoking status: Never   Smokeless tobacco: Never  Vaping Use   Vaping Use: Never used  Substance and Sexual Activity   Alcohol use: No   Drug use: No   Sexual activity: Not Currently  Other Topics Concern   Not on file  Social History Narrative   Married mother of one, one grandchild.   Very socially active, enjoys cooking and having get-togethers her house. She had been exercising regularly but her back pain limits her.   Does not smoke, does not drink alcohol.   Social Determinants of Health   Financial Resource Strain: Not on file  Food Insecurity: Not on file  Transportation Needs: Not on file  Physical Activity: Not on file  Stress: Not on file  Social Connections: Not on file     Family History: The patient's family history includes Cancer in her father; Colitis in her maternal grandfather; Diabetes in her brother; Heart attack in her brother, brother, brother, and father;  Heart disease in her father; Stroke in her brother and mother. There is no history of Colon cancer or Stomach cancer.  ROS:   Please see the history of present illness.     All other systems reviewed and are negative.  EKGs/Labs/Other Studies Reviewed:    The following studies were reviewed today: Comprehensive pacemaker interrogation and reprogramming of lead outputs to chronic levels was performed today.  EKG:   ECG was not checked today.  The ECG from 08/10/2021 was  personally reviewed and showed chronic subtle inferior T wave inversion.    Recent Labs: 06/20/2021: BUN 22; Creatinine, Ser 0.85; Hemoglobin 12.4; Platelets 306; Potassium 4.6; Sodium 139  Recent Lipid Panel    Component Value Date/Time   CHOL 245 (H) 09/08/2019 1045   TRIG 305 (H) 09/08/2019 1045   HDL 36 (L) 09/08/2019 1045   CHOLHDL 6.8 (H) 09/08/2019 1045   CHOLHDL 6.4 (H) 05/07/2016 1018   VLDL 61 (H) 05/07/2016 1018   LDLCALC 152 (H) 09/08/2019 1045     Risk Assessment/Calculations:    CHA2DS2-VASc Score = 6   This indicates a 9.7% annual risk of stroke. The patient's score is based upon: CHF History: 1 HTN History: 1 Diabetes History: 0 Stroke History: 0 Vascular Disease History: 1 Age Score: 2 Gender Score: 1          Physical Exam:    VS:  BP (!) 122/56 (BP Location: Left Arm, Patient Position: Sitting, Cuff Size: Large)    Pulse 79    Ht 5\' 7"  (1.702 m)    Wt 202 lb (91.6 kg)    SpO2 94%    BMI 31.64 kg/m     Wt Readings from Last 3 Encounters:  10/22/21 202 lb (91.6 kg)  08/10/21 199 lb 6.4 oz (90.4 kg)  07/09/21 194 lb (88 kg)      General: Alert, oriented x3, no distress, well-healed left subclavian pacemaker site Head: no evidence of trauma, PERRL, EOMI, no exophtalmos or lid lag, no myxedema, no xanthelasma; normal ears, nose and oropharynx Neck: normal jugular venous pulsations and no hepatojugular reflux; brisk carotid pulses without delay and no carotid bruits Chest: clear to auscultation, no signs of consolidation by percussion or palpation, normal fremitus, symmetrical and full respiratory excursions Cardiovascular: normal position and quality of the apical impulse, regular rhythm, normal first and second heart sounds, no murmurs, rubs or gallops Abdomen: no tenderness or distention, no masses by palpation, no abnormal pulsatility or arterial bruits, normal bowel sounds, no hepatosplenomegaly Extremities: no clubbing, cyanosis or edema; 2+  radial, ulnar and brachial pulses bilaterally; 2+ right femoral, posterior tibial and dorsalis pedis pulses; 2+ left femoral, posterior tibial and dorsalis pedis pulses; no subclavian or femoral bruits Neurological: grossly nonfocal Psych: Normal mood and affect   ASSESSMENT:    1. Paroxysmal atrial fibrillation (HCC)   2. PAT (paroxysmal atrial tachycardia) ([place])   3. Coronary artery disease involving native coronary artery of native heart with angina pectoris ([place])   4. Essential hypertension   5. Mobitz type 2 second degree atrioventricular block   6. Pacemaker     PLAN:    In order of problems listed above:  AFib: She has had 2 lengthy episodes of paroxysmal atrial fibrillation within the last weeks, 3 months after pacemaker implantation.  I do not think these can be attributed to any pacemaker related events and she probably has recurrent paroxysmal atrial fibrillation.   Her CHA2DS2-VASc score is high and she is at  high embolic risk.  We will start Eliquis 5 mg twice daily.  Discussed the risk/benefit balance of stroke prevention versus bleeding.  We will talk to Dr. Ellyn Hack about discontinuation of Plavix. PAT: She has good ventricular rate control during atrial fibrillation, but as some rapid rates during ectopic atrial tachycardia with 1: 1 AV conduction. CAD: Current episodes of chest pain is nonexertional and not convincingly consistent with angina.  She is currently on clopidogrel therapy due to overlapping stents in the LAD artery.  We will ask Dr. Allison Quarry opinion about discontinuation of clopidogrel since we are starting Eliquis HTN: Systolic blood pressure.  Note relatively low diastolic blood pressure. 2nd deg AVB: This was intermittent on her monitor.  Continues to require very little ventricular pacing. Pacemaker: Lead outputs decreased to chronic levels.  Plan remote downloads every 3 months.  They had difficulty with the downloads and had to do it manually, rather than  an automatic download.  We will try to troubleshoot that.          Medication Adjustments/Labs and Tests Ordered: Current medicines are reviewed at length with the patient today.  Concerns regarding medicines are outlined above.  No orders of the defined types were placed in this encounter.  Meds ordered this encounter  Medications   apixaban (ELIQUIS) 5 MG TABS tablet    Sig: Take 1 tablet (5 mg total) by mouth 2 (two) times daily.    Dispense:  60 tablet    Refill:  11     Patient Instructions  Medication Instructions:  START Eliquis 5 mg twice once daily  *If you need a refill on your cardiac medications before your next appointment, please call your pharmacy*   Lab Work: None ordered If you have labs (blood work) drawn today and your tests are completely normal, you will receive your results only by: [place] (if you have MyChart) OR A paper copy in the mail If you have any lab test that is abnormal or we need to change your treatment, we will call you to review the results.   Testing/Procedures: None ordered   Follow-Up: At [facility], you and your health needs are our priority.  As part of our continuing mission to provide you with exceptional heart care, we have created designated Provider Care Teams.  These Care Teams include your primary Cardiologist (physician) and Advanced Practice Providers (APPs -  Physician Assistants and Nurse Practitioners) who all work together to provide you with the care you need, when you need it.  We recommend signing up for the patient portal called "MyChart".  Sign up information is provided on this After Visit Summary.  MyChart is used to connect with patients for Virtual Visits (Telemedicine).  Patients are able to view lab/test results, encounter notes, upcoming appointments, etc.  Non-urgent messages can be sent to your provider as well.   To learn more about what you can do with MyChart, go to [email or web address].     Your next appointment:   Follow up with Dr. Ellyn Hack first available Follow up in 6 months with Dr. Sallyanne Kuster on a device day   Signed, Sanda Klein, MD  10/22/2021 3:27 PM    [place]

## 2021-10-22 NOTE — Progress Notes (Signed)
Discussed with Dr. Ellyn Hack. Stop clopidogrel when starting Eliquis.

## 2021-10-22 NOTE — Patient Instructions (Addendum)
Medication Instructions:  START Eliquis 5 mg twice once daily  *If you need a refill on your cardiac medications before your next appointment, please call your pharmacy*   Lab Work: None ordered If you have labs (blood work) drawn today and your tests are completely normal, you will receive your results only by: [place] (if you have MyChart) OR A paper copy in the mail If you have any lab test that is abnormal or we need to change your treatment, we will call you to review the results.   Testing/Procedures: None ordered   Follow-Up: At [facility], you and your health needs are our priority.  As part of our continuing mission to provide you with exceptional heart care, we have created designated Provider Care Teams.  These Care Teams include your primary Cardiologist (physician) and Advanced Practice Providers (APPs -  Physician Assistants and Nurse Practitioners) who all work together to provide you with the care you need, when you need it.  We recommend signing up for the patient portal called "MyChart".  Sign up information is provided on this After Visit Summary.  MyChart is used to connect with patients for Virtual Visits (Telemedicine).  Patients are able to view lab/test results, encounter notes, upcoming appointments, etc.  Non-urgent messages can be sent to your provider as well.   To learn more about what you can do with MyChart, go to [email or web address].    Your next appointment:   Follow up with Dr. Ellyn Hack first available Follow up in 6 months with Dr. Sallyanne Kuster on a device day

## 2021-10-22 NOTE — Telephone Encounter (Signed)
I spoke with the patient and her monitor is working correctly.

## 2021-10-23 ENCOUNTER — Telehealth: Payer: Self-pay | Admitting: *Deleted

## 2021-10-23 NOTE — Telephone Encounter (Signed)
Per Dr Ellyn Hack notation  on 10/24/21 Leonie Man, MD  Croitoru, Dani Gobble, MD; Raiford Simmonds, RN At this point I think is probably just smarter to just use Eliquis alone and stop the Plavix.   Glenetta Hew, MD     RN called and informed patient to stop taking Plavix 75 mg . Continue with Eliquis 5 mg twice a day  Updated medication list  was changed on 2/21/ 23 at office visit with Dr Sallyanne Kuster- discontinue medications  Patient verbalized understanding.

## 2021-11-16 ENCOUNTER — Telehealth: Payer: Self-pay | Admitting: Cardiology

## 2021-11-16 NOTE — Telephone Encounter (Signed)
Returned call to patient to let her know that [name], [place] will not be back until next Thursday.  ? ?Patient states that her Eliquis is not covered by her insurance and that it is too expensive for her. Advised patient that I will leave a patient assistance application at the front desk and will also check to see if a prior [name] is needed.  ? ?Also left patient 3 boxes of '5mg'$  Eliquis with patient assistance application at front desk for pick up. Patient aware of all instructions and verbalized understanding.  ? ? ?

## 2021-11-16 NOTE — Telephone Encounter (Signed)
Checked into her Eliquis. Prior [name] is not required for the medication. Will wait for the assistance forms.  ?

## 2021-11-16 NOTE — Telephone Encounter (Signed)
Patient had a personal matter she wanted to discuss with [name]. I informed the patient [name] was not in the office today. Patient said the matter is not urgent and it can wait until [name] comes back ?

## 2021-11-27 ENCOUNTER — Emergency Department (HOSPITAL_BASED_OUTPATIENT_CLINIC_OR_DEPARTMENT_OTHER): Payer: Medicare Other

## 2021-11-27 ENCOUNTER — Emergency Department (HOSPITAL_BASED_OUTPATIENT_CLINIC_OR_DEPARTMENT_OTHER)
Admission: EM | Admit: 2021-11-27 | Discharge: 2021-11-27 | Disposition: A | Discharge status: Home / Self Care | Payer: Medicare Other | Attending: Emergency Medicine | Admitting: Emergency Medicine

## 2021-11-27 ENCOUNTER — Other Ambulatory Visit: Payer: Self-pay

## 2021-11-27 ENCOUNTER — Encounter (HOSPITAL_BASED_OUTPATIENT_CLINIC_OR_DEPARTMENT_OTHER): Payer: Self-pay

## 2021-11-27 DIAGNOSIS — R0789 Other chest pain: Secondary | ICD-10-CM | POA: Diagnosis not present

## 2021-11-27 DIAGNOSIS — I110 Hypertensive heart disease with heart failure: Secondary | ICD-10-CM | POA: Insufficient documentation

## 2021-11-27 DIAGNOSIS — R7989 Other specified abnormal findings of blood chemistry: Secondary | ICD-10-CM | POA: Diagnosis not present

## 2021-11-27 DIAGNOSIS — I2510 Atherosclerotic heart disease of native coronary artery without angina pectoris: Secondary | ICD-10-CM | POA: Insufficient documentation

## 2021-11-27 DIAGNOSIS — R072 Precordial pain: Secondary | ICD-10-CM | POA: Insufficient documentation

## 2021-11-27 DIAGNOSIS — Z950 Presence of cardiac pacemaker: Secondary | ICD-10-CM | POA: Insufficient documentation

## 2021-11-27 DIAGNOSIS — Z955 Presence of coronary angioplasty implant and graft: Secondary | ICD-10-CM | POA: Diagnosis not present

## 2021-11-27 DIAGNOSIS — E119 Type 2 diabetes mellitus without complications: Secondary | ICD-10-CM | POA: Insufficient documentation

## 2021-11-27 DIAGNOSIS — I5030 Unspecified diastolic (congestive) heart failure: Secondary | ICD-10-CM | POA: Diagnosis not present

## 2021-11-27 DIAGNOSIS — R791 Abnormal coagulation profile: Secondary | ICD-10-CM | POA: Insufficient documentation

## 2021-11-27 DIAGNOSIS — R079 Chest pain, unspecified: Secondary | ICD-10-CM | POA: Diagnosis not present

## 2021-11-27 LAB — CBC
HCT: 34.9 % — ABNORMAL LOW (ref 36.0–46.0)
Hemoglobin: 11.7 g/dL — ABNORMAL LOW (ref 12.0–15.0)
MCH: 29.1 pg (ref 26.0–34.0)
MCHC: 33.5 g/dL (ref 30.0–36.0)
MCV: 86.8 fL (ref 80.0–100.0)
Platelets: 283 10*3/uL (ref 150–400)
RBC: 4.02 MIL/uL (ref 3.87–5.11)
RDW: 13.2 % (ref 11.5–15.5)
WBC: 8.1 10*3/uL (ref 4.0–10.5)
nRBC: 0 % (ref 0.0–0.2)

## 2021-11-27 LAB — TROPONIN I (HIGH SENSITIVITY)
Troponin I (High Sensitivity): 8 ng/L (ref <18)
Troponin I (High Sensitivity): 8 ng/L (ref <18)

## 2021-11-27 LAB — D-DIMER, QUANTITATIVE: D-Dimer, Quant: 1.31 ug/mL-FEU — ABNORMAL HIGH (ref 0.00–0.50)

## 2021-11-27 LAB — BASIC METABOLIC PANEL
Anion gap: 11 (ref 5–15)
BUN: 45 mg/dL — ABNORMAL HIGH (ref 8–23)
CO2: 25 mmol/L (ref 22–32)
Calcium: 9.2 mg/dL (ref 8.9–10.3)
Chloride: 100 mmol/L (ref 98–111)
Creatinine, Ser: 1.19 mg/dL — ABNORMAL HIGH (ref 0.44–1.00)
GFR, Estimated: 47 mL/min — ABNORMAL LOW (ref >60)
Glucose, Bld: 110 mg/dL — ABNORMAL HIGH (ref 70–99)
Potassium: 4 mmol/L (ref 3.5–5.1)
Sodium: 136 mmol/L (ref 135–145)

## 2021-11-27 MED ORDER — LIDOCAINE 5 % EX OINT: 1.0000 "application " | TOPICAL_OINTMENT | CUTANEOUS | 0 refills | Status: AC | PRN

## 2021-11-27 MED ORDER — DIPHENHYDRAMINE HCL 25 MG PO CAPS: 50.0000 mg | ORAL_CAPSULE | Freq: Once | ORAL | Status: DC

## 2021-11-27 MED ORDER — HYDROCORTISONE SOD SUC (PF) 250 MG IJ SOLR: 200.0000 mg | Freq: Once | INTRAMUSCULAR | Status: DC

## 2021-11-27 MED ORDER — DIPHENHYDRAMINE HCL 50 MG/ML IJ SOLN: 50.0000 mg | Freq: Once | INTRAMUSCULAR | Status: DC

## 2021-11-27 MED ORDER — IOHEXOL 350 MG/ML SOLN
80.0000 mL | Freq: Once | INTRAVENOUS | Status: AC | PRN
  Administered 2021-11-27: 80 mL via INTRAVENOUS

## 2021-11-27 NOTE — ED Provider Notes (Signed)
? ?Emergency Department Provider Note ? ? ?I have reviewed the triage vital signs and the nursing notes. ? ? ?HISTORY ? ?Chief Complaint ?Chest Pain ? ? ?HPI ?[name] is a 81 y.o. female with past medical history reviewed below presents to the emergency department for evaluation of pain to the right chest.  Symptoms been intermittent over the past 9 days.  She describes the pain as pressure but also sharp.  Pain slightly worse with movement but also with deep breathing.  She does feel some mild shortness of breath.  She is anticoagulated and remains compliant with this along with her other medications.  No radiation of symptoms or other modifying factors. ? ? ?Past Medical History:  ?Diagnosis Date  ? Arthritis   ? "fingers, right shoulder" (09/09/2017)  ? Atrial fibrillation ([place])   ? Back pain with radiation   ? Chronic Back Pain - mutliple surgeries (including tumor removal)  ? Bilateral edema of lower extremity   ? Chronic, likely related to venous stasis  ? Bradycardia   ? CAD S/P percutaneous coronary angioplasty   ? a) LHC: 07/30/10. -- 3.0x73m Integrity BMS to pRCA & 2.5x 147mIntegrity BMS mLAD(@ D2).  b) Class III-IV Angina 03/2011: LHC- ISR in LAD BMS -- prox overlapping Promus DES 2.5x1669mnd PTCA of jailed D2 ostium-prox 80%. c) 02/03/12:  LHC- patent stents.  Jailed diagonal. with stable flow; d) Peri-OP NSTEMI 04/2012 - LHC in 12/'13 -   ? C[place]ac artery stenosis [place] ? 12/2016 - STENT placement  ? Complication of anesthesia   ? "used to wake up wild years ago" (09/09/2017)  ? Congestive heart failure with LV diastolic dysfunction, NYHA class 2 ([place] ? 06/13/10:  last 2D echo-  EF >55%, Mild TR, Mod Conc LVH - Grade 1 diastolic dysfunction (abnormal relaxation) --> LVEDP on Cath 28 mmHg & mild 2nd Pulm HTN  ? Diet-controlled type 2 diabetes mellitus ([place] ? Diverticulitis of colon (without mention of hemorrhage)(562.11)   ? Dyslipidemia, goal LDL below 70   ? Intolerant to statins  ? GERD  (gastroesophageal reflux disease)   ? Hepatitis ~ 1957  ? "yellow jaundice" (01/14/2017)  ? Hepatomegaly   ? Hiatal hernia   ? History of blood transfusion 04/2012  ? "when I had a heart attack"  ? History of kidney stones   ? "I've got a stone embedded in one of my kidneys" (09/09/2017)  ? History of stomach ulcers "years ago"  ? Irritable bowel syndrome (IBS)   ? Labile essential hypertension   ? Partially related to RAS  ? Mesenteric artery stenosis (HCC)   ? 95% Celiac Artery - ostial, 20-30% SMA.  Bilateral Renal A: L RA stent patent, R RA 20-30% -- Conservative Management  ? Mobitz type 2 second degree AV block   ? NSTEMI (non-ST elevated myocardial infarction) ([place]  ? Unclear the details, apparently this was postoperative from her back surgery that she was cleared for my last saw her in June.  Reportedly had stents placed  ? Pancreas divisum   ? On pancreatic enzyme  ? PAT (paroxysmal atrial tachycardia) ([place] ? [place]al artery stenosis ([id number]; 12/'13  ? a) Angiogram 02/03/12:  50-60%L RA stenosis, 40% R R Inferior artery; b) 12/'13: S/P L RA Stent ([place] Pt. Reg) 6.0 mm x 15 mm; c) Renal Duplex 10/2013: <60% L RA, <60 R RA, ~60% SMA & Celiac A.  ? S/P placement  of cardiac pacemaker 07/2021  ? [employer]  ? Stricture and stenosis of esophagus   ? Unspecified gastritis and gastroduodenitis without mention of hemorrhage   ? ? ?Review of Systems ? ?Constitutional: No fever/chills ?Eyes: No visual changes. ?ENT: No sore throat. ?Cardiovascular: Positive chest pain. ?Respiratory: Mild shortness of breath. ?Gastrointestinal: No abdominal pain.  No nausea, no vomiting.  No diarrhea.  No constipation. ?Genitourinary: Negative for dysuria. ?Musculoskeletal: Negative for back pain. ?Skin: Negative for rash. ?Neurological: Negative for headaches, focal weakness or numbness. ? ? ?____________________________________________ ? ? ?PHYSICAL EXAM: ? ?VITAL SIGNS: ?ED Triage Vitals  ?Enc Vitals Group  ?   BP 11/27/21 1230  (!) 174/110  ?   Pulse Rate 11/27/21 1230 71  ?   Resp 11/27/21 1230 19  ?   Temp 11/27/21 1230 98 ?F (36.7 ?C)  ?   Temp Source 11/27/21 1230 Oral  ?   SpO2 11/27/21 1230 98 %  ?   Weight 11/27/21 1226 202 lb (91.6 kg)  ?   Height 11/27/21 1226 '5\' 6"'$  (1.676 m)  ? ?Constitutional: Alert and oriented. Well appearing and in no acute distress. ?Eyes: Conjunctivae are normal.  ?Head: Atraumatic. ?Nose: No congestion/rhinnorhea. ?Mouth/Throat: Mucous membranes are moist.   ?Neck: No stridor.   ?Cardiovascular: Normal rate, regular rhythm. Good peripheral circulation. Grossly normal heart sounds.   ?Respiratory: Normal respiratory effort.  No retractions. Lungs CTAB. ?Gastrointestinal: Soft and nontender. No distention.  ?Musculoskeletal: No lower extremity tenderness nor edema. No gross deformities of extremities. ?Neurologic:  Normal speech and language. No gross focal neurologic deficits are appreciated.  ?Skin:  Skin is warm, dry and intact. No rash noted. ? ?____________________________________________ ?  ?LABS ?(all labs ordered are listed, but only abnormal results are displayed) ? ?Labs Reviewed  ?BASIC METABOLIC PANEL - Abnormal; Notable for the following components:  ?    Result Value  ? Glucose, Bld 110 (*)   ? BUN 45 (*)   ? Creatinine, Ser 1.19 (*)   ? GFR, Estimated 47 (*)   ? All other components within normal limits  ?CBC - Abnormal; Notable for the following components:  ? Hemoglobin 11.7 (*)   ? HCT 34.9 (*)   ? All other components within normal limits  ?D-DIMER, QUANTITATIVE - Abnormal; Notable for the following components:  ? D-Dimer, Quant 1.31 (*)   ? All other components within normal limits  ?TROPONIN I (HIGH SENSITIVITY)  ?TROPONIN I (HIGH SENSITIVITY)  ? ?____________________________________________ ? ?EKG ? ? EKG Interpretation ? ?Date/Time:  Tuesday November 27 2021 12:33:26 EDT ?Ventricular Rate:  67 ?PR Interval:  208 ?QRS Duration: 84 ?QT Interval:  414 ?QTC Calculation: 437 ?R  Axis:   74 ?Text Interpretation: Atrial-paced rhythm T wave abnormality, consider inferior ischemia Abnormal ECG When compared with ECG of 10-Jul-2021 05:54, PREVIOUS ECG IS PRESENT Confirmed by Nanda Quinton [phone]) on 11/27/2021 2:53:01 PM ?  ? ?  ? ? ?____________________________________________ ? ?RADIOLOGY ? ?DG Chest 2 View ? ?Result Date: 11/27/2021 ?CLINICAL DATA:  Chest pain in the right ribcage over the last 9 days. EXAM: CHEST - 2 VIEW COMPARISON:  07/10/2021 FINDINGS: Heart size is normal. Aortic atherosclerotic calcification as seen previously. Dual lead pacemaker as seen previously. The lungs are clear. The vascularity is normal. There is been previous thoracolumbar fusion without visible change. Mild degenerative changes of the thoracic spine above that without visible change. Probable old healed fracture of the right lateral sixth rib. No acute rib fracture seen. IMPRESSION: No  active disease visible to explain the clinical presentation. Electronically Signed   By: Nelson Chimes M.D.   On: 11/27/2021 13:09   ? ?____________________________________________ ? ? ?PROCEDURES ? ?Procedure(s) performed:  ? ?Procedures ? ?None  ?____________________________________________ ? ? ?INITIAL IMPRESSION / ASSESSMENT AND PLAN / ED COURSE ? ?Pertinent labs & imaging results that were available during my care of the patient were reviewed by me and considered in my medical decision making (see chart for details). ?  ?This patient is Presenting for Evaluation of CP, which does require a range of treatment options, and is a complaint that involves a high risk of morbidity and mortality. ? ?The Differential Diagnoses includes all life-threatening causes for chest pain. This includes but is not exclusive to acute coronary syndrome, aortic dissection, pulmonary embolism, cardiac tamponade, community-acquired pneumonia, pericarditis, musculoskeletal chest wall pain, etc. ? ? ?Critical Interventions-  ?  ?Medications  ?iohexol  (OMNIPAQUE) 350 MG/ML injection 80 mL (80 mLs Intravenous Contrast Given 11/27/21 1605)  ? ? ?Reassessment after intervention:   ? ?I decided to review pertinent External Data, and in summary normal LV function in last EC

## 2021-11-27 NOTE — ED Notes (Signed)
Prescription reviewed, sent electronically to pharmacy. No further questions at this time ?

## 2021-11-27 NOTE — ED Triage Notes (Signed)
Pt c/o pain to right rib area-intermittent CP x 9 days-NAD-steady gait ?

## 2021-11-27 NOTE — Discharge Instructions (Signed)

## 2021-12-01 DIAGNOSIS — I4891 Unspecified atrial fibrillation: Secondary | ICD-10-CM

## 2021-12-01 HISTORY — DX: Unspecified atrial fibrillation: I48.91

## 2021-12-05 ENCOUNTER — Encounter: Payer: Self-pay | Admitting: Cardiology

## 2021-12-05 ENCOUNTER — Ambulatory Visit (INDEPENDENT_AMBULATORY_CARE_PROVIDER_SITE_OTHER): Payer: Medicare Other | Admitting: Cardiology

## 2021-12-05 VITALS — BP 108/62 | HR 75 | Ht 66.0 in | Wt 206.0 lb

## 2021-12-05 DIAGNOSIS — E1169 Type 2 diabetes mellitus with other specified complication: Secondary | ICD-10-CM

## 2021-12-05 DIAGNOSIS — I2109 ST elevation (STEMI) myocardial infarction involving other coronary artery of anterior wall: Secondary | ICD-10-CM | POA: Diagnosis not present

## 2021-12-05 DIAGNOSIS — I5030 Unspecified diastolic (congestive) heart failure: Secondary | ICD-10-CM | POA: Diagnosis not present

## 2021-12-05 DIAGNOSIS — I2510 Atherosclerotic heart disease of native coronary artery without angina pectoris: Secondary | ICD-10-CM | POA: Diagnosis not present

## 2021-12-05 DIAGNOSIS — I441 Atrioventricular block, second degree: Secondary | ICD-10-CM

## 2021-12-05 DIAGNOSIS — I480 Paroxysmal atrial fibrillation: Secondary | ICD-10-CM

## 2021-12-05 DIAGNOSIS — I701 Atherosclerosis of renal artery: Secondary | ICD-10-CM | POA: Diagnosis not present

## 2021-12-05 DIAGNOSIS — T466X5A Adverse effect of antihyperlipidemic and antiarteriosclerotic drugs, initial encounter: Secondary | ICD-10-CM

## 2021-12-05 DIAGNOSIS — I739 Peripheral vascular disease, unspecified: Secondary | ICD-10-CM | POA: Diagnosis not present

## 2021-12-05 DIAGNOSIS — I471 Supraventricular tachycardia: Secondary | ICD-10-CM

## 2021-12-05 DIAGNOSIS — E785 Hyperlipidemia, unspecified: Secondary | ICD-10-CM

## 2021-12-05 DIAGNOSIS — K551 Chronic vascular disorders of intestine: Secondary | ICD-10-CM | POA: Diagnosis not present

## 2021-12-05 DIAGNOSIS — I87302 Chronic venous hypertension (idiopathic) without complications of left lower extremity: Secondary | ICD-10-CM

## 2021-12-05 DIAGNOSIS — M791 Myalgia, unspecified site: Secondary | ICD-10-CM | POA: Diagnosis not present

## 2021-12-05 DIAGNOSIS — T466X5D Adverse effect of antihyperlipidemic and antiarteriosclerotic drugs, subsequent encounter: Secondary | ICD-10-CM

## 2021-12-05 DIAGNOSIS — Z950 Presence of cardiac pacemaker: Secondary | ICD-10-CM | POA: Diagnosis not present

## 2021-12-05 DIAGNOSIS — Z9861 Coronary angioplasty status: Secondary | ICD-10-CM

## 2021-12-05 MED ORDER — APIXABAN 5 MG PO TABS: 5.0000 mg | ORAL_TABLET | Freq: Two times a day (BID) | ORAL | 2 refills | Status: DC

## 2021-12-05 MED ORDER — AMIODARONE HCL 200 MG PO TABS: ORAL_TABLET | ORAL | 6 refills | Status: DC

## 2021-12-05 NOTE — Patient Instructions (Addendum)
Medication Instructions:  ? ?Instruction for having a fast heart rate  ?Amiodarone ---Take 400 mg (2 tablets) one dose as need for a fast heart rate ,if after 12 hours still present take 200 mg ( 1 tablet) one dose for each episode ? ? ? ?*If you need a refill on your cardiac medications before your next appointment, please call your pharmacy* ? ? ?Lab Work: ? ?Not needed ? ? ?Testing/Procedures: ?Not needed ? ? ?Follow-Up: ?At [facility], you and your health needs are our priority.  As part of our continuing mission to provide you with exceptional heart care, we have created designated Provider Care Teams.  These Care Teams include your primary Cardiologist (physician) and Advanced Practice Providers (APPs -  Physician Assistants and Nurse Practitioners) who all work together to provide you with the care you need, when you need it. ? ?  ? ?Your next appointment:   ?3 to 5 month(s) ? ?The format for your next appointment:   ?In Person ? ?Provider:   ?Glenetta Hew, MD  ? ? ? ?

## 2021-12-05 NOTE — Progress Notes (Signed)
? ? ?Primary Care Provider: Kristopher Glee., MD ?Cardiologist: Glenetta Hew, MD ?Electrophysiologist: Sanda Klein, MD ? ?Clinic Note: ?Chief Complaint  ?Patient presents with  ? Follow-up  ?  6 months.  6 weeks from last PPM evaluation with Dr. Sallyanne Kuster ->   ? Atrial Fibrillation  ?  New diagnosis confirmed by pacemaker interrogation.  Started on Eliquis with Plavix discontinued. ?- having $$ issues with Eliquis  ? Coronary Artery Disease  ?  Chronic L sided CP - (precordial pain - My heart pain)  ? ? ?=================================== ? ?ASSESSMENT/PLAN  ? ?Problem List Items Addressed This Visit   ? ?  ? Cardiology Problems  ? CAD S/P percutaneous coronary angioplasty: pRCA BMS, mLAD BMS overlapped prox with DES for ISR (Chronic)  ?  Since her renal catheterization in July 2016, she is [age].  She continues to have strange issues with chest pains but they are not consistent with angina.  She is only had BMS RCA as well as LAD followed by overlapping stents in the LAD with DES.  Last PCI was in 2011.  Last mesenteric/renal stent was before 2020. ? ?Plan: Continue carvedilol 6.25 mg twice daily, losartan 50 mg daily, Imdur 30 mg daily ?Plavix was discontinued.  Now only on Eliquis. ?Lipid management has been difficult.  See HLD section.. ?  ?  ? Relevant Medications  ? apixaban (ELIQUIS) 5 MG TABS tablet  ? amiodarone (PACERONE) 200 MG tablet  ? Other Relevant Orders  ? EKG 12-Lead (Completed)  ? Congestive heart failure with LV diastolic dysfunction, NYHA class 2 (HCC) (Chronic)  ?  Class I-IIa symptoms with some edema and exertional dyspnea if she overdoes it, but nothing really overly dramatic.  She is on carvedilol and losartan at stable doses.  She is on combination of indapamide and furosemide along with spironolactone.  With this regimen, her edema is pretty well controlled in combination with support stockings. ?  ?  ? Relevant Medications  ? apixaban (ELIQUIS) 5 MG TABS tablet  ? amiodarone (PACERONE)  200 MG tablet  ? Hyperlipidemia associated with type 2 diabetes mellitus (HCC) (Chronic)  ?  (Refused trying any statins or other medications).  Had initially thought about inclisiran as an option (did not want to PCSK9 inhibitor).  However now that she is on Eliquis, she does not want yet again another expensive medication. ? ?--> Once we get the Eliquis financial issue resolved, can consider again discussing inclisiran. ? ? ?No longer on any medications for diabetes.  Managing with diet.  For PCI. ?  ?  ? Relevant Medications  ? apixaban (ELIQUIS) 5 MG TABS tablet  ? amiodarone (PACERONE) 200 MG tablet  ? MI (myocardial infarction) ([place]) (Chronic)  ?  She did have a true ACS unstable angina presentation back in 2011 prior to her second PCI.  She has had many occasions since that that she is felt that she had a "heart attack ", however nothing is showing up on any studies -- nothing concerning on Myoview or echo no regional wall motion abnormality.  ?  ?  ? Relevant Medications  ? apixaban (ELIQUIS) 5 MG TABS tablet  ? amiodarone (PACERONE) 200 MG tablet  ? Other Relevant Orders  ? EKG 12-Lead (Completed)  ? PAF (paroxysmal atrial fibrillation) (HCC) - Primary (Chronic)  ?  Diagnosis confirmed by pacemaker interrogation.  This overall is actually looking forward with her monitor but only saw PAF and Mobitz type II AVB.  Now status post  PPM. ? ?Unfortunately, she is having prolonged spells of A-fib that are quite symptomatic.  Her rates are not fast on current dose of beta-blocker, but we discussed the potential antiarrhythmics.  She would not want to be on a standing dose, for fear of making her fatigue worse.  Thankfully, she is very symptomatic when she is in A-fib making a pill in the pocket technique viable.  With known CAD, cannot use class I antiarrhythmic. ? ?Plan: ?Continue current dose of carvedilol 6.25 mg twice daily for rate control & PAT suppression ?Continue Eliquis --> Paper Rx given to send off to  on-line pharmacy (better $) ?Will ask CVRR team to contact her re: paperwork for Med Assist) ?PRN (pill-in-the pocket): Amiodarone 400 mg x 1 for onset of Sx --  ?if persistent beyond 12 hr - take additional dose 200 mg ?If continued Sx - consider [facility] ER for DCCV ?  ?  ? Relevant Medications  ? apixaban (ELIQUIS) 5 MG TABS tablet  ? amiodarone (PACERONE) 200 MG tablet  ? Mesenteric artery stenosis (HCC) (Chronic)  ?  No post-prandial abdominal pain post intervention - follows with Vasc Sgx. ?  ?  ? Relevant Medications  ? apixaban (ELIQUIS) 5 MG TABS tablet  ? amiodarone (PACERONE) 200 MG tablet  ? PAD (peripheral artery disease) (HCC) (Chronic)  ?  Being followed by vascular surgery.  No claudication. ?Splanchnic vascular stenosis also followed by vascular surgery.  No postprandial abdominal pain. ?  ?  ? Relevant Medications  ? apixaban (ELIQUIS) 5 MG TABS tablet  ? amiodarone (PACERONE) 200 MG tablet  ? Mobitz type 2 second degree atrioventricular block (Chronic)  ?  S/p PPM in November 2022 -> although she has had some atrial pacing, has not required ventricular pacing. ?  ?  ? Relevant Medications  ? apixaban (ELIQUIS) 5 MG TABS tablet  ? amiodarone (PACERONE) 200 MG tablet  ? Other Relevant Orders  ? EKG 12-Lead (Completed)  ? PAT (paroxysmal atrial tachycardia) (HCC) (Chronic)  ?  Frequent bursts of PAT noted.  Unfortunately we do not have a lot more blood pressure room to adjust carvedilol.  However, she now has pacemaker and therefore we could potentially push forward.  If additional control is required we may consider converting from carvedilol to Toprol) ? ?Most like for PAF, if she has prolonged symptoms of PAT, could consider PRN amiodarone. ?  ?  ? Relevant Medications  ? apixaban (ELIQUIS) 5 MG TABS tablet  ? amiodarone (PACERONE) 200 MG tablet  ? Other Relevant Orders  ? EKG 12-Lead (Completed)  ?  ? Other  ? Stasis edema of bilateral lower extremity (Chronic)  ?  On 3 diuretic regimen + support  stockings. ? ?She is always on her feet or sitting with feet dependent -- suggested allowing time during the day for foot elevation. ?  ?  ? Myalgia due to statin (Chronic)  ?  Over the past 15 to 20 years, she has tried multiple different medications including every statin along with ezetimibe and Nexletol.  => Pretty much prior to even trying them, she was insistent that she was not going to take his medications. ? ?Discussion becomes somewhat contentious and therefore we have held off on further conversations but I think we may be able to consider inclisiran once the financial concerns about Eliquis are resolved. ?  ?  ? Pacemaker (Chronic)  ?  Monitored by Dr. Sallyanne Kuster -- may need signal boost for Cell Coverage issue. ?  ?  ?  Relevant Orders  ? EKG 12-Lead (Completed)  ? ?Other Visit Diagnoses   ? ? Atherosclerosis of renal artery (HCC)   (Chronic)    ? Relevant Medications  ? apixaban (ELIQUIS) 5 MG TABS tablet  ? amiodarone (PACERONE) 200 MG tablet  ? ?  ? ? ?=================================== ? ?HPI:   ? ?[name] is a 81 y.o. female with a PMH Notable for two-vessel CAD-PCI, PAD (splanchnic artery disease), HFpEF, Chronic Lower Extremity Edema (likely venous stasis), high-grade AV block-s/p PPM, with new diagnosis of PAF on PPM interrogation who presents today for 84-monthfollow-up.  She is seen in follow-up at the request of CKristopher Glee, MD. ? ?CV History ?CAD: PCI pRCA & mLAD - 2011 (BMS used = pre-Op Back Sgx) ?Post-op Back Sgx - (Nov 2011) - recurrent Angina - LAD ISR - 2 DES LAD (overlapping).   ?Relook Cath 03/16/2015: Widely patent LAD stents with D2 jailed.  Minimal D2 stenosis.  Proximal to mid RCA BMS with less than 10% ISR.  OM1 and D1 60% stenosis.-Stable. ?Myoview 01/2020: EF 60-65%> No EKG changes. No Infarct or Ischemia.  LOW RISK; (She has chronic c/o intermitted Severe CP - that she calls Angina & thinks she is having/has had an MI -- but w/u always negative.) ?PAD/Renal  Artery/Splanchnic Artery Disease ?RAS - w/p Renal A Stent; Celiac & SMA stenosis - SMA stent ?HLD -complete refusal to take statins or other medications such as Nexletol/Zetia.  Has declined CVRR plans to start [employer]

## 2021-12-10 ENCOUNTER — Telehealth: Payer: Self-pay

## 2021-12-10 NOTE — Telephone Encounter (Signed)
Pt states her cardiologist, Dr Ellyn Hack, referred her to see Dr Yong Channel. I informed pt he was not taking new patients at this time. She stated Ellyn Hack stated she needed to see [facility]. She stated she will have Dr Ellyn Hack to call and schedule her with Yong Channel ?

## 2021-12-10 NOTE — Telephone Encounter (Signed)
Are you aware of this 

## 2021-12-11 ENCOUNTER — Encounter: Payer: Self-pay | Admitting: Cardiology

## 2021-12-11 NOTE — Assessment & Plan Note (Signed)
S/p PPM in November 2022 -> although she has had some atrial pacing, has not required ventricular pacing. ?

## 2021-12-11 NOTE — Assessment & Plan Note (Signed)
She did have a true ACS unstable angina presentation back in 2011 prior to her second PCI.  She has had many occasions since that that she is felt that she had a "heart attack ", however nothing is showing up on any studies -- nothing concerning on Myoview or echo no regional wall motion abnormality.  ?

## 2021-12-11 NOTE — Assessment & Plan Note (Signed)
Diagnosis confirmed by pacemaker interrogation.  This overall is actually looking forward with her monitor but only saw PAF and Mobitz type II AVB.  Now status post PPM. ? ?Unfortunately, she is having prolonged spells of A-fib that are quite symptomatic.  Her rates are not fast on current dose of beta-blocker, but we discussed the potential antiarrhythmics.  She would not want to be on a standing dose, for fear of making her fatigue worse.  Thankfully, she is very symptomatic when she is in A-fib making a pill in the pocket technique viable.  With known CAD, cannot use class I antiarrhythmic. ? ?Plan: ?? Continue current dose of carvedilol 6.25 mg twice daily for rate control & PAT suppression ?? Continue Eliquis --> Paper Rx given to send off to on-line pharmacy (better $) ?? Will ask CVRR team to contact her re: paperwork for Med Assist) ?? PRN (pill-in-the pocket): Amiodarone 400 mg x 1 for onset of Sx --  ?? if persistent beyond 12 hr - take additional dose 200 mg ?? If continued Sx - consider [facility] ER for DCCV ?

## 2021-12-11 NOTE — Assessment & Plan Note (Addendum)
(  Refused trying any statins or other medications).  Had initially thought about inclisiran as an option (did not want to PCSK9 inhibitor).  However now that she is on Eliquis, she does not want yet again another expensive medication. ? ?--> Once we get the Eliquis financial issue resolved, can consider again discussing inclisiran. ? ? ?No longer on any medications for diabetes.  Managing with diet.  For PCI. ?

## 2021-12-11 NOTE — Assessment & Plan Note (Signed)
Over the past 15 to 20 years, she has tried multiple different medications including every statin along with ezetimibe and Nexletol.  => Pretty much prior to even trying them, she was insistent that she was not going to take his medications. ? ?Discussion becomes somewhat contentious and therefore we have held off on further conversations but I think we may be able to consider inclisiran once the financial concerns about Eliquis are resolved. ?

## 2021-12-11 NOTE — Assessment & Plan Note (Signed)
Class I-IIa symptoms with some edema and exertional dyspnea if she overdoes it, but nothing really overly dramatic.  She is on carvedilol and losartan at stable doses.  She is on combination of indapamide and furosemide along with spironolactone.  With this regimen, her edema is pretty well controlled in combination with support stockings. ?

## 2021-12-11 NOTE — Assessment & Plan Note (Signed)
No post-prandial abdominal pain post intervention - follows with Vasc Sgx. ?

## 2021-12-11 NOTE — Assessment & Plan Note (Signed)
Frequent bursts of PAT noted.  Unfortunately we do not have a lot more blood pressure room to adjust carvedilol.  However, she now has pacemaker and therefore we could potentially push forward.  If additional control is required we may consider converting from carvedilol to Toprol) ? ?Most like for PAF, if she has prolonged symptoms of PAT, could consider PRN amiodarone. ?

## 2021-12-11 NOTE — Assessment & Plan Note (Signed)
Since her renal catheterization in July 2016, she is [age].  She continues to have strange issues with chest pains but they are not consistent with angina.  She is only had BMS RCA as well as LAD followed by overlapping stents in the LAD with DES.  Last PCI was in 2011.  Last mesenteric/renal stent was before 2020. ? ?Plan: Continue carvedilol 6.25 mg twice daily, losartan 50 mg daily, Imdur 30 mg daily ?? Plavix was discontinued.  Now only on Eliquis. ?? Lipid management has been difficult.  See HLD section.Marland Kitchen ?

## 2021-12-11 NOTE — Assessment & Plan Note (Signed)
On 3 diuretic regimen + support stockings. ? ?She is always on her feet or sitting with feet dependent -- suggested allowing time during the day for foot elevation. ?

## 2021-12-11 NOTE — Assessment & Plan Note (Signed)
Monitored by Dr. Sallyanne Kuster -- may need signal boost for Cell Coverage issue. ?

## 2021-12-11 NOTE — Assessment & Plan Note (Signed)
Being followed by vascular surgery.  No claudication. ?Splanchnic vascular stenosis also followed by vascular surgery.  No postprandial abdominal pain. ?

## 2021-12-16 NOTE — Telephone Encounter (Signed)
I am willing to see at Dr. Allison Quarry request- it just may be several months before we can get her in - I need 40 minutes for this visit ideally- has a lot of medical issues. Ideally would be last patient of AM or PM- that would give me more space to work through her needs ?

## 2022-01-04 DIAGNOSIS — Z20822 Contact with and (suspected) exposure to covid-19: Secondary | ICD-10-CM | POA: Diagnosis not present

## 2022-01-10 ENCOUNTER — Encounter: Payer: Self-pay | Admitting: Family Medicine

## 2022-01-10 ENCOUNTER — Ambulatory Visit (INDEPENDENT_AMBULATORY_CARE_PROVIDER_SITE_OTHER): Payer: Medicare Other | Admitting: Family Medicine

## 2022-01-10 VITALS — BP 140/62 | HR 74 | Temp 98.3°F | Ht 66.0 in | Wt 203.2 lb

## 2022-01-10 DIAGNOSIS — E1169 Type 2 diabetes mellitus with other specified complication: Secondary | ICD-10-CM

## 2022-01-10 DIAGNOSIS — E538 Deficiency of other specified B group vitamins: Secondary | ICD-10-CM

## 2022-01-10 DIAGNOSIS — R1013 Epigastric pain: Secondary | ICD-10-CM

## 2022-01-10 DIAGNOSIS — E1142 Type 2 diabetes mellitus with diabetic polyneuropathy: Secondary | ICD-10-CM

## 2022-01-10 DIAGNOSIS — R252 Cramp and spasm: Secondary | ICD-10-CM

## 2022-01-10 DIAGNOSIS — I6523 Occlusion and stenosis of bilateral carotid arteries: Secondary | ICD-10-CM

## 2022-01-10 DIAGNOSIS — J309 Allergic rhinitis, unspecified: Secondary | ICD-10-CM | POA: Insufficient documentation

## 2022-01-10 DIAGNOSIS — E785 Hyperlipidemia, unspecified: Secondary | ICD-10-CM

## 2022-01-10 DIAGNOSIS — J301 Allergic rhinitis due to pollen: Secondary | ICD-10-CM

## 2022-01-10 DIAGNOSIS — I10 Essential (primary) hypertension: Secondary | ICD-10-CM | POA: Diagnosis not present

## 2022-01-10 DIAGNOSIS — E039 Hypothyroidism, unspecified: Secondary | ICD-10-CM | POA: Diagnosis not present

## 2022-01-10 DIAGNOSIS — Z780 Asymptomatic menopausal state: Secondary | ICD-10-CM

## 2022-01-10 DIAGNOSIS — Z1159 Encounter for screening for other viral diseases: Secondary | ICD-10-CM | POA: Diagnosis not present

## 2022-01-10 DIAGNOSIS — K861 Other chronic pancreatitis: Secondary | ICD-10-CM | POA: Diagnosis not present

## 2022-01-10 DIAGNOSIS — I701 Atherosclerosis of renal artery: Secondary | ICD-10-CM

## 2022-01-10 LAB — CBC WITH DIFFERENTIAL/PLATELET
Basophils Absolute: 0.1 10*3/uL (ref 0.0–0.1)
Basophils Relative: 1 % (ref 0.0–3.0)
Eosinophils Absolute: 0.1 10*3/uL (ref 0.0–0.7)
Eosinophils Relative: 1.4 % (ref 0.0–5.0)
HCT: 35.6 % — ABNORMAL LOW (ref 36.0–46.0)
Hemoglobin: 12 g/dL (ref 12.0–15.0)
Lymphocytes Relative: 19.1 % (ref 12.0–46.0)
Lymphs Abs: 1.7 10*3/uL (ref 0.7–4.0)
MCHC: 33.7 g/dL (ref 30.0–36.0)
MCV: 87.2 fl (ref 78.0–100.0)
Monocytes Absolute: 1 10*3/uL (ref 0.1–1.0)
Monocytes Relative: 11.1 % (ref 3.0–12.0)
Neutro Abs: 6 10*3/uL (ref 1.4–7.7)
Neutrophils Relative %: 67.4 % (ref 43.0–77.0)
Platelets: 242 10*3/uL (ref 150.0–400.0)
RBC: 4.08 Mil/uL (ref 3.87–5.11)
RDW: 14.3 % (ref 11.5–15.5)
WBC: 8.9 10*3/uL (ref 4.0–10.5)

## 2022-01-10 LAB — HEMOGLOBIN A1C: Hgb A1c MFr Bld: 5.9 % (ref 4.6–6.5)

## 2022-01-10 LAB — COMPREHENSIVE METABOLIC PANEL
ALT: 8 U/L (ref 0–35)
AST: 11 U/L (ref 0–37)
Albumin: 4.1 g/dL (ref 3.5–5.2)
Alkaline Phosphatase: 81 U/L (ref 39–117)
BUN: 27 mg/dL — ABNORMAL HIGH (ref 6–23)
CO2: 28 mEq/L (ref 19–32)
Calcium: 9.3 mg/dL (ref 8.4–10.5)
Chloride: 99 mEq/L (ref 96–112)
Creatinine, Ser: 0.95 mg/dL (ref 0.40–1.20)
GFR: 56.87 mL/min — ABNORMAL LOW (ref >60.00)
Glucose, Bld: 111 mg/dL — ABNORMAL HIGH (ref 70–99)
Potassium: 4.3 mEq/L (ref 3.5–5.1)
Sodium: 138 mEq/L (ref 135–145)
Total Bilirubin: 0.7 mg/dL (ref 0.2–1.2)
Total Protein: 7.2 g/dL (ref 6.0–8.3)

## 2022-01-10 LAB — LIPID PANEL
Cholesterol: 229 mg/dL — ABNORMAL HIGH (ref 0–200)
HDL: 36.9 mg/dL — ABNORMAL LOW (ref >39.00)
NonHDL: 192.38
Total CHOL/HDL Ratio: 6
Triglycerides: 327 mg/dL — ABNORMAL HIGH (ref 0.0–149.0)
VLDL: 65.4 mg/dL — ABNORMAL HIGH (ref 0.0–40.0)

## 2022-01-10 LAB — VITAMIN B12: Vitamin B-12: 394 pg/mL (ref 211–911)

## 2022-01-10 LAB — MAGNESIUM: Magnesium: 2.3 mg/dL (ref 1.5–2.5)

## 2022-01-10 LAB — HEPATITIS C ANTIBODY
Hepatitis C Ab: NONREACTIVE
SIGNAL TO CUT-OFF: 0.04 (ref <1.00)

## 2022-01-10 LAB — TSH: TSH: 1.96 u[IU]/mL (ref 0.35–5.50)

## 2022-01-10 LAB — LDL CHOLESTEROL, DIRECT: Direct LDL: 147 mg/dL

## 2022-01-10 MED ORDER — HYOSCYAMINE SULFATE 0.125 MG PO TBDP: 0.1250 mg | ORAL_TABLET | Freq: Four times a day (QID) | ORAL | 5 refills | Status: DC | PRN

## 2022-01-10 MED ORDER — CYANOCOBALAMIN 1000 MCG/ML IJ SOLN: INTRAMUSCULAR | 15 refills | Status: DC

## 2022-01-10 NOTE — Progress Notes (Signed)
?Phone: [phone] ?  ?Subjective:  ?Patient presents today to establish care.  Prior patient of Dr. Yong Channel.  ?Chief Complaint  ?Patient presents with  ? New Patient (Initial Visit)  ?  Pt is transferring care from Dr. Yong Channel.  ? left side pain  ?  Pt c/o left sided pain that may radiate to right side that has been going on for years and no one has been able to find the cause.  ? cramps at night  ?  Pt c/o leg and feet cramping at night.  ? ? ?See problem oriented charting ? ?The following were reviewed and entered/updated in epic: ?Past Medical History:  ?Diagnosis Date  ? Arthritis   ? "fingers, right shoulder" (09/09/2017)  ? Atrial fibrillation ([place])   ? Back pain with radiation   ? Chronic Back Pain - mutliple surgeries (including tumor removal)  ? Bilateral edema of lower extremity   ? Chronic, likely related to venous stasis  ? Bradycardia   ? CAD S/P percutaneous coronary angioplasty   ? a) LHC: 07/30/10. -- 3.0x54m Integrity BMS to pRCA & 2.5x 186mIntegrity BMS mLAD(@ D2).  b) Class III-IV Angina 03/2011: LHC- ISR in LAD BMS -- prox overlapping Promus DES 2.5x1643mnd PTCA of jailed D2 ostium-prox 80%. c) 02/03/12:  LHC- patent stents.  Jailed diagonal. with stable flow; d) Peri-OP NSTEMI 04/2012 - LHC in 12/'13 -   ? Celiac artery stenosis (HCC[place] ? 12/2016 - STENT placement  ? Complication of anesthesia   ? "used to wake up wild years ago" (09/09/2017)  ? Congestive heart failure with LV diastolic dysfunction, NYHA class 2 (HCC[place] ? 06/13/10:  last 2D echo-  EF >55%, Mild TR, Mod Conc LVH - Grade 1 diastolic dysfunction (abnormal relaxation) --> LVEDP on Cath 28 mmHg & mild 2nd Pulm HTN  ? Diet-controlled type 2 diabetes mellitus (HCCBoaz ? Diverticulitis of colon (without mention of hemorrhage)(562.11)   ? Dyslipidemia, goal LDL below 70   ? Intolerant to statins  ? GERD (gastroesophageal reflux disease)   ? Hepatitis ~ 1957  ? "yellow jaundice" (01/14/2017)  ? Hepatomegaly   ? Hiatal hernia   ?  History of blood transfusion 04/2012  ? "when I had a heart attack"  ? History of kidney stones   ? "I've got a stone embedded in one of my kidneys" (09/09/2017)  ? History of stomach ulcers "years ago"  ? Irritable bowel syndrome (IBS)   ? Labile essential hypertension   ? Partially related to RAS  ? Mesenteric artery stenosis (HCC)   ? 95% Celiac Artery - ostial, 20-30% SMA.  Bilateral Renal A: L RA stent patent, R RA 20-30% -- Conservative Management  ? Mobitz type 2 second degree AV block   ? NSTEMI (non-ST elevated myocardial infarction) (HCCPreston8/2013  ? Unclear the details, apparently this was postoperative from her back surgery that she was cleared for my last saw her in June.  Reportedly had stents placed  ? Pancreas divisum   ? On pancreatic enzyme  ? PAT (paroxysmal atrial tachycardia) (HCCKlondike ? Renal artery stenosis ([id number]; 12/'13  ? a) Angiogram 02/03/12:  50-60%L RA stenosis, 40% R R Inferior artery; b) 12/'13: S/P L RA Stent (High Pt. Reg) 6.0 mm x 15 mm; c) Renal Duplex 10/2013: <60% L RA, <60 R RA, ~60% SMA & Celiac A.  ? S/P placement of cardiac pacemaker 07/2021  ? [employer]  ?  Stricture and stenosis of esophagus   ? Unspecified gastritis and gastroduodenitis without mention of hemorrhage   ? ?Patient Active Problem List  ? Diagnosis Date Noted  ? Pacemaker 07/09/2021  ?  Priority: High  ? Mobitz type 2 second degree atrioventricular block 06/20/2021  ?  Priority: High  ? PAT (paroxysmal atrial tachycardia) ([place]) 06/20/2021  ?  Priority: High  ? PAF (paroxysmal atrial fibrillation) ([place]) 10/16/2020  ?  Priority: High  ? PAD (peripheral artery disease) ([place]) 01/14/2017  ?  Priority: High  ? Type 2 diabetes mellitus with peripheral neuropathy ([place]) 10/09/2016  ?  Priority: High  ? Mesenteric artery stenosis ([place]) 04/20/2014  ?  Priority: High  ? Abdominal pain, epigastric 01/19/2014  ?  Priority: High  ? Congestive heart failure with LV diastolic dysfunction, NYHA class 2 ([place])   ?  Priority: High  ?  Celiac artery stenosis: 60% by Duplex - 90-95% by cath - Med Rx 11/27/2013  ?  Priority: High  ? Renal artery stenosis ([place]) 08/29/2012  ?  Priority: High  ? CAD S/P percutaneous coronary angioplasty: pRCA BMS, mLAD BMS overlapped prox with DES for ISR 07/30/2010  ?  Priority: High  ? B12 deficiency 01/10/2022  ?  Priority: Medium   ? Continuous severe abdominal pain 10/11/2016  ?  Priority: Medium   ? Exertional dyspnea 03/16/2015  ?  Priority: Medium   ? Idiopathic chronic pancreatitis suspected 03/10/2014  ?  Priority: Medium   ? Abnormal CT of liver-possible cirrhosis 03/10/2014  ?  Priority: Medium   ? Stasis edema of bilateral lower extremity   ?  Priority: Medium   ? Essential hypertension   ?  Priority: Medium   ? Hyperlipidemia associated with type 2 diabetes mellitus ([place])   ?  Priority: Medium   ? Myalgia due to statin 12/29/2011  ?  Priority: Medium   ? GERD (gastroesophageal reflux disease) 01/24/2009  ?  Priority: Medium   ? Hypothyroid 11/12/2007  ?  Priority: Medium   ? Irritable bowel syndrome 11/12/2007  ?  Priority: Medium   ? PANCREAS DIVISUM 11/12/2007  ?  Priority: Medium   ? Allergic rhinitis 01/10/2022  ?  Priority: Low  ? Pneumothorax 07/10/2021  ?  Priority: Low  ? Syncope and collapse 06/20/2021  ?  Priority: Low  ? Hallux valgus (acquired), right foot 08/17/2018  ?  Priority: Low  ? Costochondritis 01/21/2017  ?  Priority: Low  ? Onychomycosis due to dermatophyte 11/16/2015  ?  Priority: Low  ? Abnormal electrocardiogram during exercise stress test 03/15/2015  ?  Priority: Low  ? Dysphagia 03/10/2014  ?  Priority: Low  ? Long term current use of antithrombotics/antiplatelets - clopidogrel 03/10/2014  ?  Priority: Low  ? Carotid stenosis 02/27/2014  ?  Priority: Low  ? Leg cramps 01/19/2014  ?  Priority: Low  ? Palpitations 01/12/2014  ?  Priority: Low  ? Diarrhea due to malabsorption 01/24/2009  ?  Priority: Low  ? Renal calculus 11/12/2007  ?  Priority: Low  ? HIATAL HERNIA 11/20/2005  ?   Priority: Low  ? ?Past Surgical History:  ?Procedure Laterality Date  ? APPENDECTOMY    ? BACK SURGERY    ? BREAST DUCTAL SYSTEM EXCISION    ? right  ? BREAST SURGERY Right   ? CARDIAC CATHETERIZATION N/A 03/16/2015  ? Procedure: Left Heart Cath and Coronary Angiography;  Surgeon: Leonie Man, MD;  Location: [facility];  Service: Cardiovascular; Widely patent m-dLAD stents as wellas pRCA stent.  High LVEDP, small Diag & Om vessels with moderate stenosis   ? Cardiac Event Monitor  01/2017  ? Mostly NSR with occasional sinus tachycardia and rare bradycardia. No A. fib. No PND or PSVT. Rare PACs and PVCs.  ? CATARACT EXTRACTION W/ INTRAOCULAR LENS  IMPLANT, BILATERAL Bilateral   ? CHOLECYSTECTOMY OPEN    ? COLONOSCOPY  03/01/2009  ? normal   ? CORONARY ANGIOPLASTY WITH STENT PLACEMENT  07/30/2010  ? 3.0x14m Integrity BMS to RCA and 2.5x 129mIntegrity BMS LAD.    ? CORONARY ANGIOPLASTY WITH STENT PLACEMENT  03/18/2011  ? Cutting Balloon PTCA of D2 (jailed - 80% ostial stenosis), DES PCI of mid LAD ISR - > Promus DES 2.5 x 16 mm postdilated to 2.8 mm) covering the proximal portion of the previous stent  ? DOBUTAMINE STRESS ECHO  03/07/2015  ? [place]ER[place] --> abnormal EKG:  strreesss test shhoowwed 1 mm ST segment depressions downsloping. No wall motion abnormalities at peak exercise or at rest. Diastolic dysfunction was noted but normal systolic function - EF greater than 55%. No bouts of regurgitation or stenosis. Resting hypertension with exaggerated response  ? ESOPHAGOGASTRODUODENOSCOPY  04/08/2012  ? ESOPHAGOGASTRODUODENOSCOPY (EGD) WITH ESOPHAGEAL DILATION  X 2  ? FRACTURE SURGERY    ? HAMMER TOE SURGERY Bilateral   ? "took bone off the top of 2nd toe on each foot"  ? HIP SURGERY Left   ? "something to do w/my back"  ? JOINT REPLACEMENT    ? KNEE SURGERY Left   ? "had fluid drained off it a couple times"  ? LEFT HEART CATH AND CORONARY ANGIOGRAPHY  07/30/2010  ?  severe LAD-diagonal 80%, moderate to severe proximal RCA.  Mean PAP 15 mmHg.  PCWP mean 17 mmHg.  RVP 45/11 mmHg.  PAP 47/24 mmHg, mean 31 mmHg.  LVEDP 27 mmHg  ? LEFT HEART CATHETERIZATION WITH CORONARY

## 2022-01-10 NOTE — Patient Instructions (Addendum)
Schedule diabetic eye exam and have it sent to [place] at [phone]. ? ?Please let us know when you have gotten your [facility] and Pneumonia vaccines at the pharmacy. ? ?I wonder about esophageal spasm-also it looks like you have had hyoscyamine prescribed in the past but not on our system-I am going to trial this when you begin to have pain you can take it up to every 6 hours if you tolerate the medicine. ? ?We will call you within two weeks about your referral to carotid ultrasound. If you do not hear within 2 weeks, give [place] a call.  ? ?Pretty please ask Dr. Ellyn Hack about the injectable cholesterol medicine- since this works differently from traditional statin cholesterol medicines I have had many patients do well with these by comparison ? ? Please stop by lab before you go ?If you have mychart- we will send your results within 3 business days of [place] receiving them.  ?If you do not have mychart- we will call you about results within 5 business days of [place] receiving them.  ?*please also note that you will see labs on mychart as soon as they post. I will later go in and write notes on them- will say "notes from Dr. Yong Channel"  ? ? Schedule your bone density test at check out desk.  ?- located [address] across the street from [place] ?- in the basement ?- you DO NEED an appointment for the bone density tests.  ? ? ?Recommended follow up: Return in about 3 months (around 04/12/2022) for followup or sooner if needed.Schedule b4 you leave.  ?

## 2022-01-11 ENCOUNTER — Other Ambulatory Visit: Payer: Self-pay | Admitting: Cardiology

## 2022-01-11 ENCOUNTER — Ambulatory Visit (INDEPENDENT_AMBULATORY_CARE_PROVIDER_SITE_OTHER): Payer: Medicare Other

## 2022-01-11 DIAGNOSIS — I441 Atrioventricular block, second degree: Secondary | ICD-10-CM | POA: Diagnosis not present

## 2022-01-11 LAB — CUP PACEART REMOTE DEVICE CHECK
Battery Remaining Longevity: 176 mo
Battery Voltage: 3.16 V
Brady Statistic AP VP Percent: 0.04 %
Brady Statistic AP VS Percent: 8.57 %
Brady Statistic AS VP Percent: 0.03 %
Brady Statistic AS VS Percent: 91.36 %
Brady Statistic RA Percent Paced: 8.54 %
Brady Statistic RV Percent Paced: 0.13 %
Date Time Interrogation Session: 20230511224151
Implantable Lead Implant Date: 20221107
Implantable Lead Implant Date: 20221107
Implantable Lead Location: 753859
Implantable Lead Location: 753860
Implantable Lead Model: 5076
Implantable Lead Model: 5076
Implantable Pulse Generator Implant Date: 20221107
Lead Channel Impedance Value: 342 Ohm
Lead Channel Impedance Value: 475 Ohm
Lead Channel Impedance Value: 494 Ohm
Lead Channel Impedance Value: 608 Ohm
Lead Channel Pacing Threshold Amplitude: 0.625 V
Lead Channel Pacing Threshold Amplitude: 0.625 V
Lead Channel Pacing Threshold Pulse Width: 0.4 ms
Lead Channel Pacing Threshold Pulse Width: 0.4 ms
Lead Channel Sensing Intrinsic Amplitude: 12 mV
Lead Channel Sensing Intrinsic Amplitude: 12 mV
Lead Channel Sensing Intrinsic Amplitude: 4.25 mV
Lead Channel Sensing Intrinsic Amplitude: 4.25 mV
Lead Channel Setting Pacing Amplitude: 1.5 V
Lead Channel Setting Pacing Amplitude: 2 V
Lead Channel Setting Pacing Pulse Width: 0.4 ms
Lead Channel Setting Sensing Sensitivity: 1.2 mV

## 2022-01-12 ENCOUNTER — Other Ambulatory Visit: Payer: Self-pay | Admitting: Physician Assistant

## 2022-01-12 DIAGNOSIS — I5030 Unspecified diastolic (congestive) heart failure: Secondary | ICD-10-CM

## 2022-01-12 DIAGNOSIS — I25119 Atherosclerotic heart disease of native coronary artery with unspecified angina pectoris: Secondary | ICD-10-CM

## 2022-01-12 DIAGNOSIS — I2510 Atherosclerotic heart disease of native coronary artery without angina pectoris: Secondary | ICD-10-CM

## 2022-01-16 ENCOUNTER — Inpatient Hospital Stay: Admission: RE | Admit: 2022-01-16 | Payer: Medicare Other | Source: Ambulatory Visit

## 2022-01-17 ENCOUNTER — Ambulatory Visit (HOSPITAL_COMMUNITY)
Admission: RE | Admit: 2022-01-17 | Discharge: 2022-01-17 | Disposition: A | Discharge status: Home / Self Care | Payer: Medicare Other | Source: Ambulatory Visit | Attending: Internal Medicine | Admitting: Internal Medicine

## 2022-01-17 DIAGNOSIS — I6523 Occlusion and stenosis of bilateral carotid arteries: Secondary | ICD-10-CM | POA: Diagnosis not present

## 2022-01-17 NOTE — Progress Notes (Signed)
Remote pacemaker transmission.   

## 2022-01-21 ENCOUNTER — Ambulatory Visit (INDEPENDENT_AMBULATORY_CARE_PROVIDER_SITE_OTHER)
Admission: RE | Admit: 2022-01-21 | Discharge: 2022-01-21 | Disposition: A | Discharge status: Home / Self Care | Payer: Medicare Other | Source: Ambulatory Visit | Attending: Family Medicine | Admitting: Family Medicine

## 2022-01-21 DIAGNOSIS — Z780 Asymptomatic menopausal state: Secondary | ICD-10-CM | POA: Diagnosis not present

## 2022-01-22 ENCOUNTER — Encounter: Payer: Self-pay | Admitting: Family Medicine

## 2022-01-22 DIAGNOSIS — M810 Age-related osteoporosis without current pathological fracture: Secondary | ICD-10-CM | POA: Insufficient documentation

## 2022-01-23 DIAGNOSIS — M7731 Calcaneal spur, right foot: Secondary | ICD-10-CM | POA: Diagnosis not present

## 2022-01-23 DIAGNOSIS — M6701 Short Achilles tendon (acquired), right ankle: Secondary | ICD-10-CM | POA: Diagnosis not present

## 2022-01-23 DIAGNOSIS — M722 Plantar fascial fibromatosis: Secondary | ICD-10-CM | POA: Diagnosis not present

## 2022-01-24 ENCOUNTER — Encounter (HOSPITAL_COMMUNITY): Payer: Medicare Other

## 2022-02-04 ENCOUNTER — Encounter: Payer: Self-pay | Admitting: Family Medicine

## 2022-02-04 ENCOUNTER — Ambulatory Visit (INDEPENDENT_AMBULATORY_CARE_PROVIDER_SITE_OTHER): Payer: Medicare Other | Admitting: Family Medicine

## 2022-02-04 VITALS — BP 102/60 | HR 83 | Temp 98.5°F | Ht 66.0 in | Wt 202.8 lb

## 2022-02-04 DIAGNOSIS — I10 Essential (primary) hypertension: Secondary | ICD-10-CM | POA: Diagnosis not present

## 2022-02-04 DIAGNOSIS — M810 Age-related osteoporosis without current pathological fracture: Secondary | ICD-10-CM | POA: Diagnosis not present

## 2022-02-04 DIAGNOSIS — I701 Atherosclerosis of renal artery: Secondary | ICD-10-CM

## 2022-02-04 MED ORDER — ALENDRONATE SODIUM 70 MG PO TABS: 70.0000 mg | ORAL_TABLET | ORAL | 11 refills | Status: DC

## 2022-02-04 MED ORDER — HYOSCYAMINE SULFATE 0.125 MG PO TBDP
0.1250 mg | ORAL_TABLET | Freq: Four times a day (QID) | ORAL | 5 refills | Status: DC | PRN
Start: 2022-02-04 — End: 2022-04-15

## 2022-02-04 NOTE — Assessment & Plan Note (Signed)
S: Last DEXA: Confirmed osteoporosis 01/22/2022 with worst T score -2.9  Medication (bisphosphonate or prolia): Fosamax started 02/04/2022  Calcium: '1200mg'$  (through diet ok) recommended-instructed on potential combination between supplement and diet and gave handout today Vitamin D: 1000 units a day recommended- bought 2000 units at [employer] and will start  A/P: Osteoporosis noted.  Discussed importance of fall prevention-has had some dizziness with orthostatic symptoms and encouraged her to consider a cane and also try to move slowly with position changes.  Discussed importance of weightbearing exercise. - Start Fosamax once weekly - Has had reflux in the past but not recently-discussed stopping medication and switching to Reclast if did not tolerate oral

## 2022-02-04 NOTE — Patient Instructions (Addendum)
You have Osteoporosis  At a minimum, recommend 800 IU of vitamin D (2000 is ok) and '1200mg'$  of Calcium per day- some can be through diet.  Once you have the above in place, I would start taking fosamax '70mg'$  once a week.  Administer first thing in the morning and >30 minutes before the first food, beverage (except plain water), or other medication of the day. Do not take with mineral water or with other beverages. Stay upright (not to lie down) for at least 30 minutes after taking medicine and until after first food of the day (to reduce irritation). Must be taken with 6 to 8 oz of plain water. The tablet should be swallowed whole; do not chew or suck.  Try voltaren gel for the finger- you may long term needs something for gout prevention - just odd that this is your first episode  Consider using cane- I considered reducing blood pressure meds but your blood is so variable I hate to lower the meds too much with your prevoiu shighs  Recommended follow up: Return for next already scheduled visit or sooner if needed.

## 2022-02-04 NOTE — Progress Notes (Signed)
Phone [phone] In person visit   Subjective:   [name] is a 81 y.o. year old very pleasant female patient who presents for/with See problem oriented charting Chief Complaint  Patient presents with   discuss dexa results    Past Medical History-  Patient Active Problem List   Diagnosis Date Noted   Osteoporosis 01/22/2022    Priority: High   Pacemaker 07/09/2021    Priority: High   Mobitz type 2 second degree atrioventricular block 06/20/2021    Priority: High   PAT (paroxysmal atrial tachycardia) ([place]) 06/20/2021    Priority: High   PAF (paroxysmal atrial fibrillation) ([place]) 10/16/2020    Priority: High   PAD (peripheral artery disease) ([place]) 01/14/2017    Priority: High   Type 2 diabetes mellitus with peripheral neuropathy ([place]) 10/09/2016    Priority: High   Mesenteric artery stenosis (HCC) 04/20/2014    Priority: High   Abdominal pain, epigastric 01/19/2014    Priority: High   Congestive heart failure with LV diastolic dysfunction, NYHA class 2 ([place])     Priority: High   Celiac artery stenosis: 60% by Duplex - 90-95% by cath - Med Rx 11/27/2013    Priority: High   Renal artery stenosis ([place]) 08/29/2012    Priority: High   CAD S/P percutaneous coronary angioplasty: pRCA BMS, mLAD BMS overlapped prox with DES for ISR 07/30/2010    Priority: High   B12 deficiency 01/10/2022    Priority: Medium    Continuous severe abdominal pain 10/11/2016    Priority: Medium    Exertional dyspnea 03/16/2015    Priority: Medium    Idiopathic chronic pancreatitis suspected 03/10/2014    Priority: Medium    Abnormal CT of liver-possible cirrhosis 03/10/2014    Priority: Medium    Stasis edema of bilateral lower extremity     Priority: Medium    Essential hypertension     Priority: Medium    Hyperlipidemia associated with type 2 diabetes mellitus ([place])     Priority: Medium    Myalgia due to statin 12/29/2011    Priority: Medium    GERD (gastroesophageal reflux  disease) 01/24/2009    Priority: Medium    Hypothyroid 11/12/2007    Priority: Medium    Irritable bowel syndrome 11/12/2007    Priority: Medium    PANCREAS DIVISUM 11/12/2007    Priority: Medium    Allergic rhinitis 01/10/2022    Priority: Low   Pneumothorax 07/10/2021    Priority: Low   Syncope and collapse 06/20/2021    Priority: Low   Hallux valgus (acquired), right foot 08/17/2018    Priority: Low   Costochondritis 01/21/2017    Priority: Low   Onychomycosis due to dermatophyte 11/16/2015    Priority: Low   Abnormal electrocardiogram during exercise stress test 03/15/2015    Priority: Low   Dysphagia 03/10/2014    Priority: Low   Long term current use of antithrombotics/antiplatelets - clopidogrel 03/10/2014    Priority: Low   Carotid stenosis 02/27/2014    Priority: Low   Leg cramps 01/19/2014    Priority: Low   Palpitations 01/12/2014    Priority: Low   Diarrhea due to malabsorption 01/24/2009    Priority: Low   Renal calculus 11/12/2007    Priority: Low   HIATAL HERNIA 11/20/2005    Priority: Low    Medications- reviewed and updated Current Outpatient Medications  Medication Sig Dispense Refill   acetaminophen (TYLENOL) 500 MG tablet Take 1,000-1,500 mg by mouth every  6 (six) hours as needed for moderate pain.     alendronate (FOSAMAX) 70 MG tablet Take 1 tablet (70 mg total) by mouth every 7 (seven) days. Take with a full glass of water on an empty stomach. 5 tablet 11   amiodarone (PACERONE) 200 MG tablet Take 400 mg  (2 tablets) one dose as need for a fast heart rate ,if after 12 hours still present take 200 mg ( 1 tablet)  one dose  for each episode 30 tablet 6   apixaban (ELIQUIS) 5 MG TABS tablet Take 1 tablet (5 mg total) by mouth 2 (two) times daily. 180 tablet 2   Artificial Tear Solution (TEARS RENEWED OP) Apply 1 drop to eye daily as needed (dry eyes).     carvedilol (COREG) 6.25 MG tablet TAKE 1 TABLET BY MOUTH TWICE DAILY WITH A MEAL. MAY TAKE AN  EXTRA TABLET DAILY IF HAVING PALPATIONS (Patient taking differently: Take 6.25 mg by mouth 2 (two) times daily with a meal.) 225 tablet 3   cyanocobalamin (,VITAMIN B-12,) 1000 MCG/ML injection 1000 mcg injection once per month. Please provide syringes as well 1 mL 15   diclofenac sodium (VOLTAREN) 1 % GEL Apply 2 g topically 4 (four) times daily as needed (pain).     furosemide (LASIX) 80 MG tablet TAKE 1 TABLET(80 MG) by mouth once a day , may take an additional 80 mg if needed for swelling (Patient taking differently: 80 mg. TAKE 1 TABLET(80 MG) by mouth twice a day , may take an additional 80 mg if needed for swelling) 180 tablet 3   indapamide (LOZOL) 2.5 MG tablet Take 2.5 mg by mouth daily.     isosorbide mononitrate (IMDUR) 30 MG 24 hr tablet TAKE 1 TABLET BY MOUTH EVERY DAY 90 tablet 3   lidocaine (XYLOCAINE) 5 % ointment Apply 1 application. topically as needed. 35.44 g 0   losartan (COZAAR) 50 MG tablet Take 1 tablet (50 mg total) by mouth daily. 90 tablet 3   nitroGLYCERIN (NITROSTAT) 0.4 MG SL tablet DISSOLVE 1 TABLET UNDER THE TONGUE EVERY 5 MINUTES AS NEEDED FOR CHEST PAIN 25 tablet 4   spironolactone (ALDACTONE) 25 MG tablet TAKE 1 TABLET BY MOUTH AS DIRECTED. TAKE 1 TABLET ON MONDAYS, WEDNESDAYS, AND FRIDAYS AT 6 PM. (Patient taking differently: Take 25 mg by mouth every Monday, Wednesday, and Friday.) 15 tablet 11   hyoscyamine (ANASPAZ) 0.125 MG TBDP disintergrating tablet Place 1 tablet (0.125 mg total) under the tongue every 6 (six) hours as needed for cramping. 30 tablet 5   No current facility-administered medications for this visit.     Objective:  BP 102/60   Pulse 83   Temp 98.5 F (36.9 C)   Ht '5\' 6"'$  (1.676 m)   Wt 202 lb 12.8 oz (92 kg)   SpO2 95%   BMI 32.73 kg/m  Gen: NAD, resting comfortably CV: RRR no murmurs rubs or gallops Lungs: CTAB no crackles, wheeze, rhonchi Ext: Stable edema Skin: Right index finger tender at PIP joint with mild erythema and  tenderness-patient reports improving (should follow-up if symptoms fail to continue to improve)    Assessment and Plan    # Osteoporosis S: Last DEXA: Confirmed osteoporosis 01/22/2022 with worst T score -2.9  Medication (bisphosphonate or prolia): Fosamax started 02/04/2022  Calcium: '1200mg'$  (through diet ok) recommended-instructed on potential combination between supplement and diet and gave handout today Vitamin D: 1000 units a day recommended- bought 2000 units at [employer] and will start  A/P: Osteoporosis noted.  Discussed importance of fall prevention-has had some dizziness with orthostatic symptoms and encouraged her to consider a cane and also try to move slowly with position changes.  Discussed importance of weightbearing exercise. - Start Fosamax once weekly - Has had reflux in the past but not recently-discussed stopping medication and switching to Reclast if did not tolerate oral  #Swelling right index finger S: Patient reports swelling right index finger which extended onto her third finger as well last week and a half or so but has steadily improved-was tender, warm A/P: Possible gout or pseudogout but significantly improved-opted to try diclofenac alone.  She has not tolerated prednisone well in the past.  Discussed getting uric acid at follow-up visit in August and she is not acutely in a flare. - Oral NSAIDs not ideal with Eliquis - We will need to do an interaction checker with colchicine if we opt for that  #Orthostatic intolerance-discussed potentially reducing medicines but looking back with her blood pressures has had high variability with some pressures up in the 160s to 170s I do not think reduction is possible this point-please be careful with position changes and consider using a cane as well -Hypertension is well controlled today-continue current medications including Lasix 80 mg with an additional tablet as needed, indapamide 2.5 mg, Imdur 30 mg, losartan 50 mg,  spironolactone 25 mg Monday Wednesday Friday  #Chronic abdominal pain-has not tried hyoscyamine yet-encouraged her to pick this up  Recommended follow up: Return for next already scheduled visit or sooner if needed. Future Appointments  Date Time Provider [place]  03/29/2022  1:30 PM Leonie Man, MD [facility]  04/12/2022  7:00 AM [facility] DEVICE REMOTES [facility]  04/15/2022 10:20 AM Marin Olp, MD [facility]  07/12/2022  7:00 AM [facility] DEVICE REMOTES [facility]  10/11/2022  7:00 AM [facility] DEVICE REMOTES [facility]    Lab/Order associations:   ICD-10-CM   1. Essential hypertension  I10     2. Age-related osteoporosis without current pathological fracture  M81.0       Meds ordered this encounter  Medications   alendronate (FOSAMAX) 70 MG tablet    Sig: Take 1 tablet (70 mg total) by mouth every 7 (seven) days. Take with a full glass of water on an empty stomach.    Dispense:  5 tablet    Refill:  11   hyoscyamine (ANASPAZ) 0.125 MG TBDP disintergrating tablet    Sig: Place 1 tablet (0.125 mg total) under the tongue every 6 (six) hours as needed for cramping.    Dispense:  30 tablet    Refill:  5    Return precautions advised.  Garret Reddish, MD

## 2022-02-20 DIAGNOSIS — I872 Venous insufficiency (chronic) (peripheral): Secondary | ICD-10-CM | POA: Diagnosis not present

## 2022-02-20 DIAGNOSIS — M722 Plantar fascial fibromatosis: Secondary | ICD-10-CM | POA: Diagnosis not present

## 2022-02-26 ENCOUNTER — Ambulatory Visit (INDEPENDENT_AMBULATORY_CARE_PROVIDER_SITE_OTHER): Payer: Medicare Other

## 2022-02-26 DIAGNOSIS — Z0000 Encounter for general adult medical examination without abnormal findings: Secondary | ICD-10-CM | POA: Diagnosis not present

## 2022-02-27 DIAGNOSIS — M25512 Pain in left shoulder: Secondary | ICD-10-CM | POA: Diagnosis not present

## 2022-02-27 DIAGNOSIS — M5030 Other cervical disc degeneration, unspecified cervical region: Secondary | ICD-10-CM | POA: Diagnosis not present

## 2022-02-28 ENCOUNTER — Telehealth: Payer: Self-pay | Admitting: Family Medicine

## 2022-02-28 NOTE — Telephone Encounter (Signed)
Thank you. I was able to schedule patient for 07/03 at 4pm.

## 2022-02-28 NOTE — Telephone Encounter (Signed)
That will be fine. 

## 2022-02-28 NOTE — Telephone Encounter (Signed)
Patient called in to reschedule visit for 06/30 at 4pm to sometime next week due to an unplanned event coming up on that day. I was looking through the schedule and wanted to know is she could use one of the virtual office spots, such as the one on 07/03 at 4pm.

## 2022-03-01 ENCOUNTER — Ambulatory Visit: Payer: Medicare Other | Admitting: Family Medicine

## 2022-03-04 ENCOUNTER — Other Ambulatory Visit: Payer: Self-pay | Admitting: Cardiology

## 2022-03-04 ENCOUNTER — Ambulatory Visit (INDEPENDENT_AMBULATORY_CARE_PROVIDER_SITE_OTHER): Payer: Medicare Other | Admitting: Family Medicine

## 2022-03-04 ENCOUNTER — Encounter: Payer: Self-pay | Admitting: Family Medicine

## 2022-03-04 VITALS — BP 126/60 | HR 87 | Temp 97.9°F | Ht 66.0 in | Wt 204.2 lb

## 2022-03-04 DIAGNOSIS — R300 Dysuria: Secondary | ICD-10-CM

## 2022-03-04 DIAGNOSIS — H8110 Benign paroxysmal vertigo, unspecified ear: Secondary | ICD-10-CM | POA: Diagnosis not present

## 2022-03-04 DIAGNOSIS — R350 Frequency of micturition: Secondary | ICD-10-CM | POA: Diagnosis not present

## 2022-03-04 DIAGNOSIS — I701 Atherosclerosis of renal artery: Secondary | ICD-10-CM | POA: Diagnosis not present

## 2022-03-04 LAB — POC URINALSYSI DIPSTICK (AUTOMATED)
Bilirubin, UA: NEGATIVE
Blood, UA: NEGATIVE
Glucose, UA: NEGATIVE
Ketones, UA: NEGATIVE
Leukocytes, UA: NEGATIVE
Nitrite, UA: NEGATIVE
Protein, UA: POSITIVE — AB
Spec Grav, UA: 1.025 (ref 1.010–1.025)
Urobilinogen, UA: 0.2 E.U./dL
pH, UA: 5 (ref 5.0–8.0)

## 2022-03-04 MED ORDER — MECLIZINE HCL 12.5 MG PO TABS: 12.5000 mg | ORAL_TABLET | Freq: Three times a day (TID) | ORAL | 0 refills | Status: DC | PRN

## 2022-03-04 MED ORDER — PREDNISONE 10 MG PO TABS: 10.0000 mg | ORAL_TABLET | Freq: Every day | ORAL | 5 refills | Status: DC

## 2022-03-04 NOTE — Progress Notes (Signed)
Phone [phone] In person visit   Subjective:   [name] is a 81 y.o. year old very pleasant female patient who presents for/with See problem oriented charting Chief Complaint  Patient presents with   Dizziness    Pt c/o dizziness when she got up on Sunday morning and complains of not being able to eat and being unbalanced.   swollen index finger    Pt c/o swollen finger that started last Sunday that doubled in size, declines injury.    Past Medical History-  Patient Active Problem List   Diagnosis Date Noted   Osteoporosis 01/22/2022    Priority: High   Pacemaker 07/09/2021    Priority: High   Mobitz type 2 second degree atrioventricular block 06/20/2021    Priority: High   PAT (paroxysmal atrial tachycardia) ([place]) 06/20/2021    Priority: High   PAF (paroxysmal atrial fibrillation) ([employer]) 10/16/2020    Priority: High   PAD (peripheral artery disease) ([place]) 01/14/2017    Priority: High   Type 2 diabetes mellitus with peripheral neuropathy ([place]) 10/09/2016    Priority: High   Mesenteric artery stenosis ([facility]) 04/20/2014    Priority: High   Abdominal pain, epigastric 01/19/2014    Priority: High   Congestive heart failure with LV diastolic dysfunction, NYHA class 2 ([place])     Priority: High   Celiac artery stenosis: 60% by Duplex - 90-95% by cath - Med Rx 11/27/2013    Priority: High   Renal artery stenosis ([place]) 08/29/2012    Priority: High   CAD S/P percutaneous coronary angioplasty: pRCA BMS, mLAD BMS overlapped prox with DES for ISR 07/30/2010    Priority: High   B12 deficiency 01/10/2022    Priority: Medium    Continuous severe abdominal pain 10/11/2016    Priority: Medium    Exertional dyspnea 03/16/2015    Priority: Medium    Idiopathic chronic pancreatitis suspected 03/10/2014    Priority: Medium    Abnormal CT of liver-possible cirrhosis 03/10/2014    Priority: Medium    Stasis edema of bilateral lower extremity     Priority: Medium    Essential  hypertension     Priority: Medium    Hyperlipidemia associated with type 2 diabetes mellitus ([place])     Priority: Medium    Myalgia due to statin 12/29/2011    Priority: Medium    GERD (gastroesophageal reflux disease) 01/24/2009    Priority: Medium    Hypothyroid 11/12/2007    Priority: Medium    Irritable bowel syndrome 11/12/2007    Priority: Medium    PANCREAS DIVISUM 11/12/2007    Priority: Medium    Allergic rhinitis 01/10/2022    Priority: Low   Pneumothorax 07/10/2021    Priority: Low   Syncope and collapse 06/20/2021    Priority: Low   Hallux valgus (acquired), right foot 08/17/2018    Priority: Low   Costochondritis 01/21/2017    Priority: Low   Onychomycosis due to dermatophyte 11/16/2015    Priority: Low   Abnormal electrocardiogram during exercise stress test 03/15/2015    Priority: Low   Dysphagia 03/10/2014    Priority: Low   Long term current use of antithrombotics/antiplatelets - clopidogrel 03/10/2014    Priority: Low   Carotid stenosis 02/27/2014    Priority: Low   Leg cramps 01/19/2014    Priority: Low   Palpitations 01/12/2014    Priority: Low   Diarrhea due to malabsorption 01/24/2009    Priority: Low   Renal calculus  11/12/2007    Priority: Low   HIATAL HERNIA 11/20/2005    Priority: Low    Medications- reviewed and updated Current Outpatient Medications  Medication Sig Dispense Refill   acetaminophen (TYLENOL) 500 MG tablet Take 1,000-1,500 mg by mouth every 6 (six) hours as needed for moderate pain.     alendronate (FOSAMAX) 70 MG tablet Take 1 tablet (70 mg total) by mouth every 7 (seven) days. Take with a full glass of water on an empty stomach. 5 tablet 11   amiodarone (PACERONE) 200 MG tablet Take 400 mg  (2 tablets) one dose as need for a fast heart rate ,if after 12 hours still present take 200 mg ( 1 tablet)  one dose  for each episode 30 tablet 6   apixaban (ELIQUIS) 5 MG TABS tablet Take 1 tablet (5 mg total) by mouth 2 (two) times  daily. 180 tablet 2   Artificial Tear Solution (TEARS RENEWED OP) Apply 1 drop to eye daily as needed (dry eyes).     carvedilol (COREG) 6.25 MG tablet TAKE 1 TABLET BY MOUTH TWICE DAILY WITH A MEAL. MAY TAKE AN EXTRA TABLET DAILY IF HAVING PALPATIONS (Patient taking differently: Take 6.25 mg by mouth 2 (two) times daily with a meal.) 225 tablet 3   cyanocobalamin (,VITAMIN B-12,) 1000 MCG/ML injection 1000 mcg injection once per month. Please provide syringes as well 1 mL 15   diclofenac sodium (VOLTAREN) 1 % GEL Apply 2 g topically 4 (four) times daily as needed (pain).     furosemide (LASIX) 80 MG tablet TAKE 1 TABLET(80 MG) by mouth once a day , may take an additional 80 mg if needed for swelling (Patient taking differently: 80 mg. TAKE 1 TABLET(80 MG) by mouth twice a day , may take an additional 80 mg if needed for swelling) 180 tablet 3   hyoscyamine (ANASPAZ) 0.125 MG TBDP disintergrating tablet Place 1 tablet (0.125 mg total) under the tongue every 6 (six) hours as needed for cramping. 30 tablet 5   indapamide (LOZOL) 2.5 MG tablet TAKE ONE TABLET BY MOUTH DAILY, 30 MINUTES BEFORE DAILY FUROSEMIDE. 90 tablet 2   isosorbide mononitrate (IMDUR) 30 MG 24 hr tablet TAKE 1 TABLET BY MOUTH EVERY DAY 90 tablet 3   lidocaine (XYLOCAINE) 5 % ointment Apply 1 application. topically as needed. 35.44 g 0   losartan (COZAAR) 50 MG tablet Take 1 tablet (50 mg total) by mouth daily. 90 tablet 3   meclizine (ANTIVERT) 12.5 MG tablet Take 1 tablet (12.5 mg total) by mouth 3 (three) times daily as needed for dizziness. 30 tablet 0   nitroGLYCERIN (NITROSTAT) 0.4 MG SL tablet DISSOLVE 1 TABLET UNDER THE TONGUE EVERY 5 MINUTES AS NEEDED FOR CHEST PAIN 25 tablet 4   predniSONE (DELTASONE) 10 MG tablet Take 1 tablet (10 mg total) by mouth daily with breakfast. Very low dose for gout. Colchicine does not work for her coreg or amiodarone. No nsaids with xarelto 5 tablet 5   spironolactone (ALDACTONE) 25 MG tablet  TAKE 1 TABLET BY MOUTH AS DIRECTED. TAKE 1 TABLET ON MONDAYS, WEDNESDAYS, AND FRIDAYS AT 6 PM. (Patient taking differently: Take 25 mg by mouth every Monday, Wednesday, and Friday.) 15 tablet 11   No current facility-administered medications for this visit.     Objective:  BP 126/60   Pulse 87   Temp 97.9 F (36.6 C)   Ht '5\' 6"'$  (1.676 m)   Wt 204 lb 3.2 oz (92.6 kg)  SpO2 97%   BMI 32.96 kg/m  Gen: NAD, resting comfortably CV: RRR no murmurs rubs or gallops Lungs: CTAB no crackles, wheeze, rhonchi Abdomen: soft/nontender/nondistended/normal bowel sounds. No rebound or guarding.  Ext: stable nonpitting edema Neuro: CN II-XII intact, sensation and reflexes normal throughout, 5/5 muscle strength in bilateral upper and lower extremities. Normal finger to nose. Normal rapid alternating movements. No pronator drift. Normal romberg.  Prefers to lean on husband to wall-does not lean toward a particular side.  Reports vertigo and visual nystagmus when stands quickly  Results for orders placed or performed in visit on 03/04/22 (from the past 24 hour(s))  POCT Urinalysis Dipstick (Automated)     Status: Abnormal   Collection Time: 03/04/22  4:42 PM  Result Value Ref Range   Color, UA yellow    Clarity, UA clear    Glucose, UA Negative Negative   Bilirubin, UA neg    Ketones, UA neg    Spec Grav, UA 1.025 1.010 - 1.025   Blood, UA neg    pH, UA 5.0 5.0 - 8.0   Protein, UA Positive (A) Negative   Urobilinogen, UA 0.2 0.2 or 1.0 E.U./dL   Nitrite, UA neg    Leukocytes, UA Negative Negative       Assessment and Plan   # vertigo S: Noted vertigo when she got up on Sunday when was getting out of bed. Had room spinning sensation and felt ill on her stomach. Fell back into bed at least twice. She was having to hold on walls to walk. Also has had lower appetite and has felt somewhat unbalanced. Everytime she stands up gets spinning sensation. Does not get similar when she is in bed- doesn't  roll much though ROS- No facial or extremity weakness. No slurred words or trouble swallowing. no blurry vision or double vision. No  new paresthesias- known neuropathy. No confusion or word finding difficulties. No new  hearing loss reported. No tinnitus above baseline reported. No chest pain or shortness of breath above baseline A/P: Patient with positional vertigo that resolved with sitting or holding head still.  She is not sure if she can tolerate Dix-Hallpike maneuver and this was not performed-she did have some nystagmus with certain head movements including standing up quickly - Suspect BPPV.  Reassuring neurological exam - Sent in meclizine but discussed potential risks - Refer to vestibular rehab - Discussed if new or worsening symptoms certainly seek care but I do not strongly suspect mass or stroke which was her main concern  # right 2nd finger pain S:a week or so ago noted tremendous swelling, redness, pain but is improving-received a shoulder injection and seems to have improved further No results found for: "LABURIC" A/P: Patient with recurrent pain in her finger-wonder about gout -amiodarone and carvedilol interacts with colchicine -nsaids not ideal with eliquis -prednisone can cause insomnia but we thought this was the lowest potential risk if we tried a lower dose and this was sent in if she had recurrent issues - Plan to check uric acid at next visit -Discussed dietary changes  #Concern for UTI S: Patients symptoms started peeing more frequently last few weeks - history of incontinence but is worse.  Complains of dysuria: yes; polyuria: yes; nocturia: yes; urgency: yes.  Symptoms are worsening.  ROS- no fever, chills, nausea, vomiting, flank pain. No blood in urine.  A/P: UA pretty reassuring other than protein.  Possible UTI. Will get culture.  We opted out of empiric treatment for now  especially with chronicity though has worsened in recent weeks but will follow-up urine  culture Patient to follow up if new or worsening symptoms or failure to improve.   # Diabetes S: Medication: Diet controlled Lab Results  Component Value Date   HGBA1C 5.9 01/10/2022   HGBA1C 6.0 09/01/2007   A/P: A1c is reasonable enough that I think she could tolerate prednisone if needed   #hypertension S: Medication:  lasix 80 mg (BID if needed for edema), indapaminde 2.5 mg, imdur 30 mg, losartan 50 mg, spironolactone 25 mg  Home readings BP #s: 130s/60s or 70s BP Readings from Last 3 Encounters:  03/04/22 126/60  02/04/22 102/60  01/10/22 140/62  A/P: Initially when patient reported dizziness concerned that she may primarily be orthostatic but with this diagnosis and home blood pressures running in the 130s did not feel strongly about reducing medicine in light of other comorbid conditions    Recommended follow up: Return for as needed for new, worsening, persistent symptoms. Future Appointments  Date Time Provider [place]  03/29/2022  1:30 PM Leonie Man, MD [facility]  04/12/2022  7:00 AM [facility] DEVICE REMOTES [facility]  04/15/2022 10:20 AM Marin Olp, MD [facility]  07/12/2022  7:00 AM [facility] DEVICE REMOTES [facility]  10/11/2022  7:00 AM [facility] DEVICE REMOTES [facility]  03/10/2023 10:30 AM [facility] HEALTH COACH [facility]    Lab/Order associations:   ICD-10-CM   1. Urinary frequency  R35.0 POCT Urinalysis Dipstick (Automated)    Urine Culture    2. Dysuria  R30.0 POCT Urinalysis Dipstick (Automated)    Urine Culture    3. Benign paroxysmal positional vertigo, unspecified laterality  H81.10 meclizine (ANTIVERT) 12.5 MG tablet    Ambulatory referral to Physical Therapy      Meds ordered this encounter  Medications   predniSONE (DELTASONE) 10 MG tablet    Sig: Take 1 tablet (10 mg total) by mouth daily with breakfast. Very low dose for gout. Colchicine does not work for her  coreg or amiodarone. No nsaids with xarelto    Dispense:  5 tablet    Refill:  5   meclizine (ANTIVERT) 12.5 MG tablet    Sig: Take 1 tablet (12.5 mg total) by mouth 3 (three) times daily as needed for dizziness.    Dispense:  30 tablet    Refill:  0    Return precautions advised.  Garret Reddish, MD

## 2022-03-04 NOTE — Patient Instructions (Addendum)
Call and schedule diabetic eye exam.  Limit red meat. Try low dose prednisone for next flare- if you do not tolerate this and continue to have flares I may reconsider one of the other options/potentially talk to cardiology  For your vertigo I am concerned this may be benign paroxysmal positional vertigo - Try meclizine/Antivert as needed as long as you do not feel too sedated with this-definitely have your husband close to help avoid any potential falls after taking this the first time for at least 8 hours We did an urgent referral to vestibular rehab with physical therapy-team should reach out on Wednesday about this  If you have new or worsening symptoms or any of the symptoms we discussed such as new onset blurry vision or extremity weakness or trouble with your words then please seek care emergently over the holiday

## 2022-03-05 LAB — URINE CULTURE
MICRO NUMBER:: 13601302
SPECIMEN QUALITY:: ADEQUATE

## 2022-03-08 DIAGNOSIS — M722 Plantar fascial fibromatosis: Secondary | ICD-10-CM | POA: Diagnosis not present

## 2022-03-08 DIAGNOSIS — M109 Gout, unspecified: Secondary | ICD-10-CM | POA: Diagnosis not present

## 2022-03-13 DIAGNOSIS — M722 Plantar fascial fibromatosis: Secondary | ICD-10-CM | POA: Diagnosis not present

## 2022-03-13 DIAGNOSIS — M6701 Short Achilles tendon (acquired), right ankle: Secondary | ICD-10-CM | POA: Diagnosis not present

## 2022-03-19 ENCOUNTER — Ambulatory Visit: Payer: Medicare Other | Admitting: Physical Therapy

## 2022-03-29 ENCOUNTER — Ambulatory Visit (INDEPENDENT_AMBULATORY_CARE_PROVIDER_SITE_OTHER): Payer: Medicare Other | Admitting: Cardiology

## 2022-03-29 ENCOUNTER — Encounter: Payer: Self-pay | Admitting: Cardiology

## 2022-03-29 VITALS — BP 130/58 | HR 78 | Ht 66.0 in | Wt 202.0 lb

## 2022-03-29 DIAGNOSIS — I2510 Atherosclerotic heart disease of native coronary artery without angina pectoris: Secondary | ICD-10-CM

## 2022-03-29 DIAGNOSIS — E785 Hyperlipidemia, unspecified: Secondary | ICD-10-CM | POA: Diagnosis not present

## 2022-03-29 DIAGNOSIS — I701 Atherosclerosis of renal artery: Secondary | ICD-10-CM | POA: Diagnosis not present

## 2022-03-29 DIAGNOSIS — I5030 Unspecified diastolic (congestive) heart failure: Secondary | ICD-10-CM

## 2022-03-29 DIAGNOSIS — I480 Paroxysmal atrial fibrillation: Secondary | ICD-10-CM | POA: Diagnosis not present

## 2022-03-29 DIAGNOSIS — I441 Atrioventricular block, second degree: Secondary | ICD-10-CM

## 2022-03-29 DIAGNOSIS — I6523 Occlusion and stenosis of bilateral carotid arteries: Secondary | ICD-10-CM | POA: Diagnosis not present

## 2022-03-29 DIAGNOSIS — Z9861 Coronary angioplasty status: Secondary | ICD-10-CM | POA: Diagnosis not present

## 2022-03-29 DIAGNOSIS — E1169 Type 2 diabetes mellitus with other specified complication: Secondary | ICD-10-CM | POA: Diagnosis not present

## 2022-03-29 DIAGNOSIS — I10 Essential (primary) hypertension: Secondary | ICD-10-CM

## 2022-03-29 DIAGNOSIS — I87302 Chronic venous hypertension (idiopathic) without complications of left lower extremity: Secondary | ICD-10-CM | POA: Diagnosis not present

## 2022-03-29 DIAGNOSIS — I471 Supraventricular tachycardia: Secondary | ICD-10-CM

## 2022-03-29 NOTE — Patient Instructions (Addendum)
Medication Instructions:   No changes    Keep a record of when  you use Amiodarone medication and how long you used it   *If you need a refill on your cardiac medications before your next appointment, please call your pharmacy*   Lab Work:  Not needed   Testing/Procedures: Not needed   Follow-Up: At [facility], you and your health needs are our priority.  As part of our continuing mission to provide you with exceptional heart care, we have created designated Provider Care Teams.  These Care Teams include your primary Cardiologist (physician) and Advanced Practice Providers (APPs -  Physician Assistants and Nurse Practitioners) who all work together to provide you with the care you need, when you need it.     Your next appointment:   7 month(s)  The format for your next appointment:   In Person  Provider:   Glenetta Hew, MD

## 2022-03-29 NOTE — Progress Notes (Unsigned)
Primary Care Provider: Marin Olp, MD Cardiologist: Glenetta Hew, MD Electrophysiologist: Sanda Klein, MD  Clinic Note: No chief complaint on file.  ===================================  ASSESSMENT/PLAN   Problem List Items Addressed This Visit   None   ===================================  HPI:    [name] is a 81 y.o. female with a PMH below who presents today for ***. [name] is a 81 y.o. female who is being seen today for the evaluation of *** at the request of Marin Olp, MD.  [name] was last seen on 12/05/21: -- Newly Dx Afib.   Recent Hospitalizations: ***  Reviewed  CV studies:    The following studies were reviewed today: (if available, images/films reviewed: From Epic Chart or Care Everywhere) ***:   Interval History:   [name]   CV Review of Symptoms (Summary) Cardiovascular ROS: {roscv:310661}  REVIEWED OF SYSTEMS   ROS  I have reviewed and (if needed) personally updated the patient's problem list, medications, allergies, past medical and surgical history, social and family history.   PAST MEDICAL HISTORY   Past Medical History:  Diagnosis Date   Arthritis    "fingers, right shoulder" (09/09/2017)   Atrial fibrillation (HCC)    Back pain with radiation    Chronic Back Pain - mutliple surgeries (including tumor removal)   Bilateral edema of lower extremity    Chronic, likely related to venous stasis   Bradycardia    CAD S/P percutaneous coronary angioplasty    a) LHC: 07/30/10. -- 3.0x24m Integrity BMS to pRCA & 2.5x 129mIntegrity BMS mLAD(@ D2).  b) Class III-IV Angina 03/2011: LHC- ISR in LAD BMS -- prox overlapping Promus DES 2.5x1664mnd PTCA of jailed D2 ostium-prox 80%. c) 02/03/12:  LHC- patent stents.  Jailed diagonal. with stable flow; d) Peri-OP NSTEMI 04/2012 - LHC in 12/'13 -    Celiac artery stenosis ([place]  12/2016 - STENT placement   Complication of anesthesia    "used to wake up wild  years ago" (09/09/2017)   Congestive heart failure with LV diastolic dysfunction, NYHA class 2 ([place]  06/13/10:  last 2D echo-  EF >55%, Mild TR, Mod Conc LVH - Grade 1 diastolic dysfunction (abnormal relaxation) --> LVEDP on Cath 28 mmHg & mild 2nd Pulm HTN   Diet-controlled type 2 diabetes mellitus ([place]  Diverticulitis of colon (without mention of hemorrhage)(562.11)    Dyslipidemia, goal LDL below 70    Intolerant to statins   GERD (gastroesophageal reflux disease)    Hepatitis ~ 1957   "yellow jaundice" (01/14/2017)   Hepatomegaly    Hiatal hernia    History of blood transfusion 04/2012   "when I had a heart attack"   History of kidney stones    "I've got a stone embedded in one of my kidneys" (09/09/2017)   History of stomach ulcers "years ago"   Irritable bowel syndrome (IBS)    Labile essential hypertension    Partially related to RAS   Mesenteric artery stenosis (HCC)    95% Celiac Artery - ostial, 20-30% SMA.  Bilateral Renal A: L RA stent patent, R RA 20-30% -- Conservative Management   Mobitz type 2 second degree AV block    NSTEMI (non-ST elevated myocardial infarction) ([place]   Unclear the details, apparently this was postoperative from her back surgery that she was cleared for my last saw her in June.  Reportedly had stents placed  Pancreas divisum    On pancreatic enzyme   PAT (paroxysmal atrial tachycardia) (HCC)    Renal artery stenosis ([place]) 2011; 12/'13   a) Angiogram 02/03/12:  50-60%L RA stenosis, 40% R R Inferior artery; b) 12/'13: S/P L RA Stent ([facility]) 6.0 mm x 15 mm; c) Renal Duplex 10/2013: <60% L RA, <60 R RA, ~60% SMA & Celiac A.   S/P placement of cardiac pacemaker 07/2021   Medtronic   Stricture and stenosis of esophagus    Unspecified gastritis and gastroduodenitis without mention of hemorrhage     PAST SURGICAL HISTORY   Past Surgical History:  Procedure Laterality Date   APPENDECTOMY     BACK SURGERY     BREAST DUCTAL SYSTEM  EXCISION     right   BREAST SURGERY Right    CARDIAC CATHETERIZATION N/A 03/16/2015   Procedure: Left Heart Cath and Coronary Angiography;  Surgeon: Leonie Man, MD;  Location: [facility];  Service: Cardiovascular; Widely patent m-dLAD stents as wellas pRCA stent.  High LVEDP, small Diag & Om vessels with moderate stenosis    Cardiac Event Monitor  01/2017   Mostly NSR with occasional sinus tachycardia and rare bradycardia. No A. fib. No PND or PSVT. Rare PACs and PVCs.   CATARACT EXTRACTION W/ INTRAOCULAR LENS  IMPLANT, BILATERAL Bilateral    CHOLECYSTECTOMY OPEN     COLONOSCOPY  03/01/2009   normal    CORONARY ANGIOPLASTY WITH STENT PLACEMENT  07/30/2010   3.0x40m Integrity BMS to RCA and 2.5x 147mIntegrity BMS LAD.     CORONARY ANGIOPLASTY WITH STENT PLACEMENT  03/18/2011   Cutting Balloon PTCA of D2 (jailed - 80% ostial stenosis), DES PCI of mid LAD ISR - > Promus DES 2.5 x 16 mm postdilated to 2.8 mm) covering the proximal portion of the previous stent   DOBUTAMINE STRESS ECHO  03/07/2015   [facility] (ordered for pre-op evaluation for EUS/ERCP --> abnormal EKG:  strreesss test shhoowwed 1 mm ST segment depressions downsloping. No wall motion abnormalities at peak exercise or at rest. Diastolic dysfunction was noted but normal systolic function - EF greater than 55%. No bouts of regurgitation or stenosis. Resting hypertension with exaggerated response   ESOPHAGOGASTRODUODENOSCOPY  04/08/2012   ESOPHAGOGASTRODUODENOSCOPY (EGD) WITH ESOPHAGEAL DILATION  X 2   FRACTURE SURGERY     HAMMER TOE SURGERY Bilateral    "took bone off the top of 2nd toe on each foot"   HIP SURGERY Left    "something to do w/my back"   JOINT REPLACEMENT     KNEE SURGERY Left    "had fluid drained off it a couple times"   LEFT HEART CATH AND CORONARY ANGIOGRAPHY  07/30/2010   severe LAD-diagonal 80%, moderate to severe proximal RCA.  Mean PAP 15 mmHg.  PCWP mean 17 mmHg.  RVP 45/11 mmHg.  PAP 47/24 mmHg,  mean 31 mmHg.  LVEDP 27 mmHg   LEFT HEART CATHETERIZATION WITH CORONARY ANGIOGRAM N/A 02/03/2012   Procedure: LEFT HEART CATHETERIZATION WITH CORONARY ANGIOGRAM;  Surgeon: DaLeonie ManMD;  [facility]; widely patent LAD and RCA stents.  Patent D2 ostium.  Moderate L Renal A stenosis (56%), R Renal A 40-50% t.  LVEDP 20 mmHg.   LEFT HEART CATHETERIZATION WITH CORONARY ANGIOGRAM N/A 05/12/2014   Procedure: LEFT HEART CATHETERIZATION WITH CORONARY ANGIOGRAM;  Surgeon: JoLorretta HarpMD;  [facility];  Service: Cardiovascular: Stable CAD. Patent stents. Patent renal artery stent  LEFT HEART CATHETERIZATION WITH CORONARY ANGIOGRAM  08/2012   Peri-Op MI @ [facility] -- 40% ostial D1, patent LAD stents, 10% RCA ISR   LEFT HEART CATHETERIZATION WITH CORONARY ANGIOGRAM   03/18/2011   70% ISR of LAD stent just after D2 (D2 has ostial 80 to 90% stenosis.  20 to 30% ISR RCA.  EDP elevated at 28 mmHg   NM MYOVIEW LTD  12/2016   LOW RISK study. No ischemia or infarction. EF 65-75%.   NM MYOVIEW LTD  01/05/2020    LOW RISK. EF 60-65%.  No EKG changes.  No infarct, no ischemia.   PACEMAKER IMPLANT N/A 07/09/2021   Procedure: PACEMAKER IMPLANT;  Surgeon: Sanda Klein, MD;  Location: [place] [facility];  Service: Cardiovascular;  Laterality: N/A;   PANCREAS SURGERY     "stent in my pancreas"; Dr Pershing Proud   PERCUTANEOUS PINNING PHALANX FRACTURE OF HAND Left ~ 2013   PERIPHERAL VASCULAR BALLOON ANGIOPLASTY  09/09/2017   Procedure: PERIPHERAL VASCULAR BALLOON ANGIOPLASTY;  Surgeon: Serafina Mitchell, MD;  Location: [place] [facility];  Service: Cardiovascular;;  Celiac instent   PERIPHERAL VASCULAR INTERVENTION  01/14/2017   Procedure: Peripheral Vascular Intervention;  Surgeon: Serafina Mitchell, MD;  Location: [place] [facility];  Service: Cardiovascular;;  mesentric   RENAL ARTERY STENT Left 08/2012   @ [facility] - 6.0 mm x 15 mm   SPINE SURGERY     tumor  removed 07/2010; Redo Surgery 04/2012; Sacroiliac Sgx 08/2013   TOTAL ABDOMINAL HYSTERECTOMY     TOTAL KNEE ARTHROPLASTY Left ~ 2012   TRANSTHORACIC ECHOCARDIOGRAM  07/10/2021   (Postop PPM): Normal LV size and function.  EF 60 to 65%.  No RWMA.  GR 1 DD.  Mild LA dilation.  Normal RV size and function.  Normal RAP.  Normal aortic and mitral valves.  No pericardial effusion.   VISCERAL ANGIOGRAM N/A 05/12/2014   Procedure: VISCERAL ANGIOGRAM;  Surgeon: Lorretta Harp, MD;  Location: [facility] LAB;  25% ostial proximal celiac artery with downward takeoff.  23% proximal SMA and 56% proximal IMA.  Left renal artery stent widely patent.  Right renal artery is 20 to 30% proximal stenosis.   VISCERAL ANGIOGRAPHY N/A 01/14/2017   Procedure: Mesenteric  Angiography;  Surgeon: Serafina Mitchell, MD;  Location: [place] [facility];  Service: Cardiovascular;  Laterality: N/A;   VISCERAL ANGIOGRAPHY N/A 09/09/2017   Procedure: VISCERAL ANGIOGRAPHY;  Surgeon: Serafina Mitchell, MD;  Location: [place] [facility];  Service: Cardiovascular;  Laterality: N/A;    Immunization History  Administered Date(s) Administered   Tdap 03/19/2012   Zoster, Live 03/31/2009    MEDICATIONS/ALLERGIES   Current Meds  Medication Sig   acetaminophen (TYLENOL) 500 MG tablet Take 1,000-1,500 mg by mouth every 6 (six) hours as needed for moderate pain.   amiodarone (PACERONE) 200 MG tablet Take 400 mg  (2 tablets) one dose as need for a fast heart rate ,if after 12 hours still present take 200 mg ( 1 tablet)  one dose  for each episode   apixaban (ELIQUIS) 5 MG TABS tablet Take 1 tablet (5 mg total) by mouth 2 (two) times daily.   Artificial Tear Solution (TEARS RENEWED OP) Apply 1 drop to eye daily as needed (dry eyes).   carvedilol (COREG) 6.25 MG tablet TAKE 1 TABLET BY MOUTH TWICE DAILY WITH A MEAL. MAY TAKE AN EXTRA TABLET DAILY IF HAVING PALPATIONS (Patient taking  differently: Take 6.25 mg by mouth 2 (two) times daily with a  meal.)   colchicine 0.6 MG tablet Take by mouth.   cyanocobalamin (,VITAMIN B-12,) 1000 MCG/ML injection 1000 mcg injection once per month. Please provide syringes as well   diclofenac sodium (VOLTAREN) 1 % GEL Apply 2 g topically 4 (four) times daily as needed (pain).   furosemide (LASIX) 80 MG tablet TAKE 1 TABLET(80 MG) by mouth once a day , may take an additional 80 mg if needed for swelling (Patient taking differently: 80 mg. TAKE 1 TABLET(80 MG) by mouth twice a day , may take an additional 80 mg if needed for swelling)   hyoscyamine (ANASPAZ) 0.125 MG TBDP disintergrating tablet Place 1 tablet (0.125 mg total) under the tongue every 6 (six) hours as needed for cramping.   indapamide (LOZOL) 2.5 MG tablet TAKE ONE TABLET BY MOUTH DAILY, 30 MINUTES BEFORE DAILY FUROSEMIDE.   isosorbide mononitrate (IMDUR) 30 MG 24 hr tablet TAKE 1 TABLET BY MOUTH EVERY DAY   lidocaine (XYLOCAINE) 5 % ointment Apply 1 application. topically as needed.   losartan (COZAAR) 50 MG tablet Take 1 tablet (50 mg total) by mouth daily.   meclizine (ANTIVERT) 12.5 MG tablet Take 1 tablet (12.5 mg total) by mouth 3 (three) times daily as needed for dizziness.   nitroGLYCERIN (NITROSTAT) 0.4 MG SL tablet DISSOLVE 1 TABLET UNDER THE TONGUE EVERY 5 MINUTES AS NEEDED FOR CHEST PAIN   spironolactone (ALDACTONE) 25 MG tablet TAKE 1 TABLET BY MOUTH AS DIRECTED. TAKE 1 TABLET ON MONDAYS, WEDNESDAYS, AND FRIDAYS AT 6 PM.    Allergies  Allergen Reactions   Codeine Phosphate Anaphylaxis, Shortness Of Breath and Swelling   Contrast Media [Iodinated Contrast Media] Swelling    Swelling of the face. Reaction was to ionic contrast many tears ago. Patient has had non-ionic contrast many time without pre medication and has had no reaction.    Penicillin G Anaphylaxis    Patient denies allergy to penicillin.   Iodine Swelling and Rash    Many years ago. Topical iodine only.    Prednisone Other (See Comments)    Hyperactivity,  insomnia   Statins Other (See Comments)    Hyperactivity   Tramadol Palpitations and Other (See Comments)    Hyperactivity    SOCIAL HISTORY/FAMILY HISTORY   Reviewed in Epic:  Pertinent findings:  Social History   Tobacco Use   Smoking status: Never   Smokeless tobacco: Never  Vaping Use   Vaping Use: Never used  Substance Use Topics   Alcohol use: No   Drug use: No   Social History   Social History Narrative   Married 33 years in 2023. 1 child Conne in [place].  1 grandchild in [place].       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       Hobbies: cooking/baking, loves to feed people    OBJCTIVE -PE, EKG, labs   Wt Readings from Last 3 Encounters:  03/29/22 202 lb (91.6 kg)  03/04/22 204 lb 3.2 oz (92.6 kg)  02/04/22 202 lb 12.8 oz (92 kg)    Physical Exam: BP (!) 130/58   Pulse 78   Ht '5\' 6"'$  (1.676 m)   Wt 202 lb (91.6 kg)   SpO2 96%   BMI 32.60 kg/m  Physical Exam  Adult ECG Report  Rate: *** ;  Rhythm: {rhythm:17366};   Narrative Interpretation: ***  Recent Labs:  ***  Lab Results  Component Value Date   CHOL 229 (H) 01/10/2022   HDL 36.90 (L) 01/10/2022   LDLCALC 152 (H) 09/08/2019   LDLDIRECT 147.0 01/10/2022   TRIG 327.0 (H) 01/10/2022   CHOLHDL 6 01/10/2022   Lab Results  Component Value Date   CREATININE 0.95 01/10/2022   BUN 27 (H) 01/10/2022   NA 138 01/10/2022   K 4.3 01/10/2022   CL 99 01/10/2022   CO2 28 01/10/2022      Latest Ref Rng & Units 01/10/2022   10:14 AM 11/27/2021    1:06 PM 06/20/2021    3:52 PM  CBC  WBC 4.0 - 10.5 K/uL 8.9  8.1  9.5   Hemoglobin 12.0 - 15.0 g/dL 12.0  11.7  12.4   Hematocrit 36.0 - 46.0 % 35.6  34.9  36.0   Platelets 150.0 - 400.0 K/uL 242.0  283  306     Lab Results  Component Value Date   HGBA1C 5.9 01/10/2022   Lab Results   Component Value Date   TSH 1.96 01/10/2022    ================================================== I spent a total of ***minutes with the patient spent in direct patient consultation.  Additional time spent with chart review  / charting (studies, outside notes, etc): *** min Total Time: *** min  Current medicines are reviewed at length with the patient today.  (+/- concerns) ***  Notice: This dictation was prepared with Dragon dictation along with smart phrase technology. Any transcriptional errors that result from this process are unintentional and may not be corrected upon review.  Studies Ordered:   No orders of the defined types were placed in this encounter.  No orders of the defined types were placed in this encounter.   Patient Instructions / Medication Changes & Studies & Tests Ordered   There are no Patient Instructions on file for this visit.     Glenetta Hew, M.D., M.S. Interventional Cardiologist   Pager # [phone] Phone # [phone] [address] [place], [place] [address]   Thank you for choosing [facility] at [facility]!!

## 2022-04-01 ENCOUNTER — Ambulatory Visit: Payer: Medicare Other | Attending: Family Medicine | Admitting: Physical Therapy

## 2022-04-01 ENCOUNTER — Encounter: Payer: Self-pay | Admitting: Cardiology

## 2022-04-01 ENCOUNTER — Encounter: Payer: Self-pay | Admitting: Physical Therapy

## 2022-04-01 DIAGNOSIS — R278 Other lack of coordination: Secondary | ICD-10-CM | POA: Insufficient documentation

## 2022-04-01 DIAGNOSIS — M25512 Pain in left shoulder: Secondary | ICD-10-CM | POA: Diagnosis not present

## 2022-04-01 DIAGNOSIS — R42 Dizziness and giddiness: Secondary | ICD-10-CM | POA: Insufficient documentation

## 2022-04-01 DIAGNOSIS — R262 Difficulty in walking, not elsewhere classified: Secondary | ICD-10-CM | POA: Insufficient documentation

## 2022-04-01 DIAGNOSIS — R293 Abnormal posture: Secondary | ICD-10-CM | POA: Diagnosis not present

## 2022-04-01 DIAGNOSIS — H8110 Benign paroxysmal vertigo, unspecified ear: Secondary | ICD-10-CM | POA: Diagnosis not present

## 2022-04-01 DIAGNOSIS — R2681 Unsteadiness on feet: Secondary | ICD-10-CM | POA: Diagnosis not present

## 2022-04-01 DIAGNOSIS — M25511 Pain in right shoulder: Secondary | ICD-10-CM | POA: Diagnosis not present

## 2022-04-01 NOTE — Assessment & Plan Note (Signed)
Remains euvolemic today.  Only puffy swelling.  She is on carvedilol and losartan at stable doses.  BP relatively well controlled.  She takes her furosemide daily and rarely has to use an additional dose.  This is usually preceded by indapamide.  With this combination her swelling is pretty well controlled, but she really does not have any PND orthopnea.  I suspect that some of the edema is probably stasis and not CHF related.

## 2022-04-01 NOTE — Assessment & Plan Note (Signed)
Carotid Dopplers reviewed-normal.

## 2022-04-01 NOTE — Assessment & Plan Note (Signed)
She adamantly refuses taking any statins, Zetia or fibrate's.  We have tried on several occasions to refer to [facility] lipid clinic to discuss inclisiran or Repatha.  She has indicated that she is not interested at this point.  Since we are not following treating her lipids with anything, no real reason to follow-up labs.  LDL is 147.  She understands the risk involved, but this has been ongoing since I met her back in 2011.  She has not required any additional cardiac PCI.  At age 81, she does not want to take new medicines.

## 2022-04-01 NOTE — Assessment & Plan Note (Addendum)
Has been several years since her last cardiac catheterization but her last PCI was in 2011.  She had a mesenteric and renal artery stenting more recently.  Not having any active angina symptoms.  She [name] has some chest pain spells which she calls her angina, but they have never been borne out to be positive on stress test.  She could have microvascular disease.  Plan: Continue carvedilol and losartan for heart rate and reduction as well as BP control. Continue same diuretic dose to avoid volume overload. He is on 30 mg Imdur.  Still uses PRN NTG every now and then No longer on aspirin or Plavix.  On Eliquis.  Would have low threshold to consider low-dose amlodipine instead of Imdur

## 2022-04-01 NOTE — Assessment & Plan Note (Signed)
Diagnosis confirmed on pacemaker interrogation earlier this year.  She has had prolonged spells of A-fib that were relatively symptomatic.  She gets tired and fatigued as well as having exertional dyspnea and chest pain.  She has at least 3 occasions since I last saw her where she has had to use amiodarone for breakthrough spells. She is getting Eliquis through a pharmacy in [place].  Plan:  Continue baseline rate control with carvedilol 6.25 mg twice daily.  Continue Eliquis 5 mg twice daily  Continue afterload reduction with losartan  Pill in the pocket PRN amiodarone: 400 mg x 1, if persists beyond 12 hours take additional 200 mg. => If she has to take another dose, consider [facility], ER for DCCV.

## 2022-04-01 NOTE — Assessment & Plan Note (Signed)
She has had frequent bursts of PAT that probably are what it ended up this morning the PAF. She is usually rate controlled with carvedilol 625 mg twice daily.  Much like PAF, if she does have prolonged symptoms, can use PRN amiodarone

## 2022-04-01 NOTE — Therapy (Signed)
OUTPATIENT PHYSICAL THERAPY VESTIBULAR EVALUATION     Patient Name: [name] MRN: [id number] DOB:[birth year], 81 y.o., female Today's Date: 04/01/2022  PCP: Adah Salvage PROVIDER: Garret Reddish   PT End of Session - 04/01/22 1717     Visit Number 1    Date for PT Re-Evaluation 06/10/22    PT Start Time 6010    PT Stop Time 1712    PT Time Calculation (min) 39 min             Past Medical History:  Diagnosis Date   Arthritis    "fingers, right shoulder" (09/09/2017)   Atrial fibrillation ([place]) 12/2021   New diagnosis noted on pacemaker interrogation.   Back pain with radiation    Chronic Back Pain - mutliple surgeries (including tumor removal)   Bilateral edema of lower extremity    Chronic, likely related to venous stasis   Bradycardia    Pacemaker placed   CAD S/P percutaneous coronary angioplasty    a) LHC: 07/30/10. -- 3.0x4m Integrity BMS to pRCA & 2.5x 135mIntegrity BMS mLAD(@ D2).  b) Class III-IV Angina 03/2011: LHC- ISR in LAD BMS -- prox overlapping Promus DES 2.5x1661mnd PTCA of jailed D2 ostium-prox 80%. c) 02/03/12:  LHC- patent stents.  Jailed diagonal. with stable flow; d) Peri-OP NSTEMI 04/2012 - LHC in 12/'13 -    Celiac artery stenosis ([place]  12/2016 - STENT placement   Complication of anesthesia    "used to wake up wild years ago" (09/09/2017)   Congestive heart failure with LV diastolic dysfunction, NYHA class 2 ([place]  06/13/10:  last 2D echo-  EF >55%, Mild TR, Mod Conc LVH - Grade 1 diastolic dysfunction (abnormal relaxation) --> LVEDP on Cath 28 mmHg & mild 2nd Pulm HTN   Diet-controlled type 2 diabetes mellitus (HCCEnsign  Diverticulitis of colon (without mention of hemorrhage)(562.11)    Dyslipidemia, goal LDL below 70    Intolerant to statins   GERD (gastroesophageal reflux disease)    Hepatitis ~ 1957   "yellow jaundice" (01/14/2017)   Hepatomegaly    Hiatal hernia    History of blood transfusion 04/2012   "when I had a  heart attack"   History of kidney stones    "I've got a stone embedded in one of my kidneys" (09/09/2017)   History of stomach ulcers "years ago"   Irritable bowel syndrome (IBS)    Labile essential hypertension    Partially related to RAS   Mesenteric artery stenosis (HCC)    95% Celiac Artery - ostial, 20-30% SMA.  Bilateral Renal A: L RA stent patent, R RA 20-30% -- Conservative Management   Mobitz type 2 second degree AV block    Status post PPM placement   NSTEMI (non-ST elevated myocardial infarction) (HCCWilson8/2013   Unclear the details, apparently this was postoperative from her back surgery that she was cleared for my last saw her in June.  Reportedly had stents placed   Pancreas divisum    On pancreatic enzyme   PAT (paroxysmal atrial tachycardia) (HCC)    Renal artery stenosis (HCC[place] Lucas011; 12/'13   a) Angiogram 02/03/12:  50-60%L RA stenosis, 40% R R Inferior artery; b) 12/'13: S/P L RA Stent ([facility]) 6.0 mm x 15 mm; c) Renal Duplex 10/2013: <60% L RA, <60 R RA, ~60% SMA & Celiac A.   S/P placement of cardiac pacemaker 07/2021   [employer]   Stricture and stenosis  of esophagus    Unspecified gastritis and gastroduodenitis without mention of hemorrhage    Past Surgical History:  Procedure Laterality Date   APPENDECTOMY     BACK SURGERY     BREAST DUCTAL SYSTEM EXCISION     right   BREAST SURGERY Right    CARDIAC CATHETERIZATION N/A 03/16/2015   Procedure: Left Heart Cath and Coronary Angiography;  Surgeon: Leonie Man, MD;  Location: [facility];  Service: Cardiovascular; Widely patent m-dLAD stents as wellas pRCA stent.  High LVEDP, small Diag & Om vessels with moderate stenosis    Cardiac Event Monitor  01/2017   Mostly NSR with occasional sinus tachycardia and rare bradycardia. No A. fib. No PND or PSVT. Rare PACs and PVCs.   CATARACT EXTRACTION W/ INTRAOCULAR LENS  IMPLANT, BILATERAL Bilateral    CHOLECYSTECTOMY OPEN     COLONOSCOPY  03/01/2009   normal     CORONARY ANGIOPLASTY WITH STENT PLACEMENT  07/30/2010   3.0x86m Integrity BMS to RCA and 2.5x 131mIntegrity BMS LAD.     CORONARY ANGIOPLASTY WITH STENT PLACEMENT  03/18/2011   Cutting Balloon PTCA of D2 (jailed - 80% ostial stenosis), DES PCI of mid LAD ISR - > Promus DES 2.5 x 16 mm postdilated to 2.8 mm) covering the proximal portion of the previous stent   DOBUTAMINE STRESS ECHO  03/07/2015   [facility] (ordered for pre-op evaluation for EUS/ERCP --> abnormal EKG:  strreesss test shhoowwed 1 mm ST segment depressions downsloping. No wall motion abnormalities at peak exercise or at rest. Diastolic dysfunction was noted but normal systolic function - EF greater than 55%. No bouts of regurgitation or stenosis. Resting hypertension with exaggerated response   ESOPHAGOGASTRODUODENOSCOPY  04/08/2012   ESOPHAGOGASTRODUODENOSCOPY (EGD) WITH ESOPHAGEAL DILATION  X 2   FRACTURE SURGERY     HAMMER TOE SURGERY Bilateral    "took bone off the top of 2nd toe on each foot"   HIP SURGERY Left    "something to do w/my back"   JOINT REPLACEMENT     KNEE SURGERY Left    "had fluid drained off it a couple times"   LEFT HEART CATH AND CORONARY ANGIOGRAPHY  07/30/2010   severe LAD-diagonal 80%, moderate to severe proximal RCA.  Mean PAP 15 mmHg.  PCWP mean 17 mmHg.  RVP 45/11 mmHg.  PAP 47/24 mmHg, mean 31 mmHg.  LVEDP 27 mmHg   LEFT HEART CATHETERIZATION WITH CORONARY ANGIOGRAM N/A 02/03/2012   Procedure: LEFT HEART CATHETERIZATION WITH CORONARY ANGIOGRAM;  Surgeon: DaLeonie ManMD;  [facility]; widely patent LAD and RCA stents.  Patent D2 ostium.  Moderate L Renal A stenosis (56%), R Renal A 40-50% t.  LVEDP 20 mmHg.   LEFT HEART CATHETERIZATION WITH CORONARY ANGIOGRAM N/A 05/12/2014   Procedure: LEFT HEART CATHETERIZATION WITH CORONARY ANGIOGRAM;  Surgeon: JoLorretta HarpMD;  [facility];  Service: Cardiovascular: Stable CAD. Patent stents. Patent renal artery stent   LEFT HEART  CATHETERIZATION WITH CORONARY ANGIOGRAM  08/2012   Peri-Op MI @ [facility] -- 40% ostial D1, patent LAD stents, 10% RCA ISR   LEFT HEART CATHETERIZATION WITH CORONARY ANGIOGRAM   03/18/2011   70% ISR of LAD stent just after D2 (D2 has ostial 80 to 90% stenosis.  20 to 30% ISR RCA.  EDP elevated at 28 mmHg   NM MYOVIEW LTD  12/2016   LOW RISK study. No ischemia or infarction. EF 65-75%.  NM MYOVIEW LTD  01/05/2020    LOW RISK. EF 60-65%.  No EKG changes.  No infarct, no ischemia.   PACEMAKER IMPLANT N/A 07/09/2021   Procedure: PACEMAKER IMPLANT;  Surgeon: Sanda Klein, MD;  Location: [facility];  Service: Cardiovascular;  Laterality: N/A;   PANCREAS SURGERY     "stent in my pancreas"; Dr Pershing Proud   PERCUTANEOUS PINNING PHALANX FRACTURE OF HAND Left ~ 2013   PERIPHERAL VASCULAR BALLOON ANGIOPLASTY  09/09/2017   Procedure: PERIPHERAL VASCULAR BALLOON ANGIOPLASTY;  Surgeon: Serafina Mitchell, MD;  Location: [facility];  Service: Cardiovascular;;  Celiac instent   PERIPHERAL VASCULAR INTERVENTION  01/14/2017   Procedure: Peripheral Vascular Intervention;  Surgeon: Serafina Mitchell, MD;  Location: [facility];  Service: Cardiovascular;;  mesentric   RENAL ARTERY STENT Left 08/2012   @ [facility] - 6.0 mm x 15 mm   SPINE SURGERY     tumor removed 07/2010; Redo Surgery 04/2012; Sacroiliac Sgx 08/2013   TOTAL ABDOMINAL HYSTERECTOMY     TOTAL KNEE ARTHROPLASTY Left ~ 2012   TRANSTHORACIC ECHOCARDIOGRAM  07/10/2021   (Postop PPM): Normal LV size and function.  EF 60 to 65%.  No RWMA.  GR 1 DD.  Mild LA dilation.  Normal RV size and function.  Normal RAP.  Normal aortic and mitral valves.  No pericardial effusion.   VISCERAL ANGIOGRAM N/A 05/12/2014   Procedure: VISCERAL ANGIOGRAM;  Surgeon: Lorretta Harp, MD;  Location: [facility];  25% ostial proximal celiac artery with downward takeoff.  23% proximal SMA and 56% proximal IMA.  Left renal artery stent  widely patent.  Right renal artery is 20 to 30% proximal stenosis.   VISCERAL ANGIOGRAPHY N/A 01/14/2017   Procedure: Mesenteric  Angiography;  Surgeon: Serafina Mitchell, MD;  Location: [place] [facility];  Service: Cardiovascular;  Laterality: N/A;   VISCERAL ANGIOGRAPHY N/A 09/09/2017   Procedure: VISCERAL ANGIOGRAPHY;  Surgeon: Serafina Mitchell, MD;  Location: [place] [facility];  Service: Cardiovascular;  Laterality: N/A;   Patient Active Problem List   Diagnosis Date Noted   Osteoporosis 01/22/2022   B12 deficiency 01/10/2022   Allergic rhinitis 01/10/2022   Pneumothorax 07/10/2021   Pacemaker 07/09/2021   Syncope and collapse 06/20/2021   Mobitz type 2 second degree atrioventricular block 06/20/2021   PAT (paroxysmal atrial tachycardia) ([place]) 06/20/2021   PAF (paroxysmal atrial fibrillation) ([place]) 10/16/2020   Hallux valgus (acquired), right foot 08/17/2018   Costochondritis 01/21/2017   PAD (peripheral artery disease) ([place]) 01/14/2017   Continuous severe abdominal pain 10/11/2016   Type 2 diabetes mellitus with peripheral neuropathy ([place]) 10/09/2016   Onychomycosis due to dermatophyte 11/16/2015   Exertional dyspnea 03/16/2015   Abnormal electrocardiogram during exercise stress test 03/15/2015   Mesenteric artery stenosis ([place]) 04/20/2014   Idiopathic chronic pancreatitis suspected 03/10/2014   Abnormal CT of liver-possible cirrhosis 03/10/2014   Dysphagia 03/10/2014   Long term current use of antithrombotics/antiplatelets - clopidogrel 03/10/2014   Carotid stenosis 02/27/2014   Abdominal pain, epigastric 01/19/2014   Leg cramps 01/19/2014   Palpitations 01/12/2014   Stasis edema of bilateral lower extremity    Congestive heart failure with LV diastolic dysfunction, NYHA class 2 ([place])    Essential hypertension    Hyperlipidemia associated with type 2 diabetes mellitus ([place])    Celiac artery stenosis: 60% by Duplex - 90-95% by cath - Med Rx 11/27/2013   Renal artery  stenosis ([place]) 08/29/2012  Myalgia due to statin 12/29/2011   CAD S/P percutaneous coronary angioplasty: pRCA BMS, mLAD BMS overlapped prox with DES for ISR 07/30/2010   GERD (gastroesophageal reflux disease) 01/24/2009   Diarrhea due to malabsorption 01/24/2009   Hypothyroid 11/12/2007   Irritable bowel syndrome 11/12/2007   Renal calculus 11/12/2007   PANCREAS DIVISUM 11/12/2007   HIATAL HERNIA 11/20/2005    ONSET DATE: 03/04/22  REFERRING DIAG:  Diagnosis  H81.10 (ICD-10-CM) - Benign paroxysmal positional vertigo, unspecified laterality    THERAPY DIAG:  Dizziness and giddiness  Abnormal posture  Difficulty in walking, not elsewhere classified  Other lack of coordination  Unsteadiness on feet  Rationale for Evaluation and Treatment Rehabilitation  SUBJECTIVE:   SUBJECTIVE STATEMENT: Patient reports she had Covid about 2 years ago and feels like it affected her balance. She gets very dizzy, especially when she bends over. She had a few falls due to the dizziness. She feels it is getting worse. She also has severe dizziness when she sits up after sleeping. She also received steroid shots in B shoulders recently due to arthritis today. Pt accompanied by: self  PERTINENT HISTORY: S: Noted vertigo when she got up on Sunday when was getting out of bed. Had room spinning sensation and felt ill on her stomach. Fell back into bed at least twice. She was having to hold on walls to walk. Also has had lower appetite and has felt somewhat unbalanced. Everytime she stands up gets spinning sensation. Does not get similar when she is in bed- doesn't roll much though ROS- No facial or extremity weakness. No slurred words or trouble swallowing. no blurry vision or double vision. No  new paresthesias- known neuropathy. No confusion or word finding difficulties. No new  hearing loss reported. No tinnitus above baseline reported. No chest pain or shortness of breath above baseline A/P: Patient  with positional vertigo that resolved with sitting or holding head still.  She is not sure if she can tolerate Dix-Hallpike maneuver and this was not performed-she did have some nystagmus with certain head movements including standing up quickly - Suspect BPPV.  Reassuring neurological exam - Sent in meclizine but discussed potential risks - Refer to vestibular rehab - Discussed if new or worsening symptoms certainly seek care but I do not strongly suspect mass or stroke which was her main concern     PAIN:  Are you having pain? Yes: NPRS scale: 5/10 Pain location: R foot Pain description: has gout Aggravating factors: standing, WB Relieving factors: medication  PRECAUTIONS: None  WEIGHT BEARING RESTRICTIONS No  FALLS: Has patient fallen in last 6 months? Yes. Number of falls a couple per week.  LIVING ENVIRONMENT: Lives with: lives with their spouse Lives in: House/apartment Stairs: Yes: External: 3 steps; on left going up Has following equipment at home: Single point cane  PLOF: Independent  PATIENT GOALS: Decrease dizziness and falls.  OBJECTIVE:   DIAGNOSTIC FINDINGS: UTI assessment neg,   COGNITION: Overall cognitive status: Within functional limits for tasks assessed   SENSATION: Not tested Patient reports B PN  MUSCLE TONE: WNL   POSTURE: rounded shoulders, forward head, decreased lumbar lordosis, increased thoracic kyphosis, and posterior pelvic tilt   Cervical ROM:    Active A/PROM (deg) eval  Flexion 80%  Extension 10  Right lateral flexion 50  Left lateral flexion 50  Right rotation 60  Left rotation 60  (Blank rows = not tested)  STRENGTH: BLE strength 4/5 BLE  GAIT: Gait pattern: step through pattern,  decreased arm swing- Right, decreased arm swing- Left, decreased step length- Right, decreased step length- Left, and shuffling Distance walked: 80 Assistive device utilized: None Level of assistance: Complete Independence Comments: Fearful  and cautious  FUNCTIONAL TESTs:  Functional gait assessment: TBA   VESTIBULAR ASSESSMENT   GENERAL OBSERVATION: Patient walked in with no issues. No observable dizziness during history or reporting symptoms.    SYMPTOM BEHAVIOR:   Subjective history: Started after having Covid 2 years ago, seems to be progressing, causing regular falls.   Non-Vestibular symptoms:  none   Type of dizziness: Unsteady with head/body turns, "World moves", and "Swimmyheaded"   Frequency: Frequently in the morning   Duration: minutes   Aggravating factors: Induced by position change: supine to sit, Induced by motion: bending down to the ground, and Worse in the morning   Relieving factors: head stationary   Progression of symptoms: worse   OCULOMOTOR EXAM:   Ocular Alignment: normal   Ocular ROM: No Limitations and Caused 3-4 dizziness, resolved in < 1 minute   Spontaneous Nystagmus: right beating   Gaze-Induced Nystagmus: right beating with right gaze   Smooth Pursuits: intact   Saccades: intact and slow   Convergence/Divergence: 20 cm     VESTIBULAR - OCULAR REFLEX:    Slow VOR: Normal   VOR Cancellation: Unable to Maintain Gaze   Head-Impulse Test: HIT Right: negative HIT Left: negative   Dynamic Visual Acuity: Not able to be assessed    POSITIONAL TESTING: Right Dix-Hallpike: no nystagmus Left Dix-Hallpike: no nystagmus Right Roll Test: TBA Left Roll Test: TBA Right Sidelying: TBA Left Sidelying: TBA Deep Head Hang: TBA   OTHOSTATICS: TBA  FUNCTIONAL GAIT: Functional gait assessment: TBA  PATIENT EDUCATION: Education details: POC Person educated: Patient Education method: Explanation Education comprehension: verbalized understanding   GOALS: Goals reviewed with patient? Yes  SHORT TERM GOALS: Target date: 04/29/2022   I with basic HEP Baseline: Goal status: INITIAL  LONG TERM GOALS: Target date: 06/10/2022    I with final HEP Baseline:  Goal status: INITIAL  2.   Patient will report dizziness of <3/10 first thing in the morning. Baseline: Fluctuating dizziness, worse in the morning when first getting out of bed and bending forward. Goal status: INITIAL  3.  Patient will score at least 24/30 on FGA Baseline: TBA Goal status: INITIAL  4.  Patient will report dizziness < 3/10 with bending forward. Baseline: Up to 8/10 Goal status: INITIAL  5.  Patient will report no falls or near falls during functional mobility x 2 weeks in a row. Baseline: Multiple near falls. Goal status: INITIAL  ASSESSMENT:  CLINICAL IMPRESSION: Patient is a 81 y.o. who was seen today for physical therapy evaluation and treatment for Dizziness, falls. She reports frequent dizziness first thing in the morning when sitting up as well as when she bends forward. Could not complete full evaluation due to multiple symptoms. Need to assess orthostatic BP's, complete the vestibular exam that was initiated in order to identify specific treatment options to address her dizziness and falls.   OBJECTIVE IMPAIRMENTS Abnormal gait, decreased activity tolerance, decreased balance, decreased coordination, difficulty walking, dizziness, and postural dysfunction.   ACTIVITY LIMITATIONS carrying, lifting, bending, squatting, and locomotion level  PARTICIPATION LIMITATIONS: meal prep, cleaning, laundry, driving, and shopping  PERSONAL FACTORS Age and Past/current experiences are also affecting patient's functional outcome.   REHAB POTENTIAL: Good  CLINICAL DECISION MAKING: Evolving/moderate complexity  EVALUATION COMPLEXITY: Moderate   PLAN: PT FREQUENCY: 1-2x/week  PT DURATION: 10 weeks  PLANNED INTERVENTIONS: Therapeutic exercises, Therapeutic activity, Neuromuscular re-education, Balance training, Gait training, Patient/Family education, Self Care, Joint mobilization, Vestibular training, Canalith repositioning, and Visual/preceptual remediation/compensation  PLAN FOR NEXT  SESSION: Complete vestibular exam, assess orthostatics, HEP   Marcelina Morel, DPT 04/01/2022, 7:04 PM

## 2022-04-01 NOTE — Assessment & Plan Note (Signed)
Status post PPM in November 2022 by Dr. Sallyanne Kuster.  Thankfully, she has not required pacing.  The presence of pacemaker allows [place] to be more aggressive with beta-blocker and use amiodarone as needed basis.

## 2022-04-01 NOTE — Assessment & Plan Note (Signed)
I am relatively happy with borderline BP control to avoid hypotension and lightheadedness, dizziness.  She remains on carvedilol 6.25 mg daily and losartan 50 mg daily.  She is also taking spironolactone on Mondays Wednesdays Fridays.

## 2022-04-01 NOTE — Assessment & Plan Note (Addendum)
On triple diuretic regimen as mentioned.  Also on support socks.  Also recommend foot elevation.  She is always on her feet, and is probably making hard to manage.  As of right now it seems to be relatively stable.

## 2022-04-12 ENCOUNTER — Ambulatory Visit (INDEPENDENT_AMBULATORY_CARE_PROVIDER_SITE_OTHER): Payer: Medicare Other

## 2022-04-12 DIAGNOSIS — I480 Paroxysmal atrial fibrillation: Secondary | ICD-10-CM | POA: Diagnosis not present

## 2022-04-15 ENCOUNTER — Ambulatory Visit (INDEPENDENT_AMBULATORY_CARE_PROVIDER_SITE_OTHER): Payer: Medicare Other | Admitting: Family Medicine

## 2022-04-15 ENCOUNTER — Encounter: Payer: Self-pay | Admitting: Family Medicine

## 2022-04-15 ENCOUNTER — Ambulatory Visit: Payer: Medicare Other | Attending: Family Medicine | Admitting: Physical Therapy

## 2022-04-15 ENCOUNTER — Ambulatory Visit (HOSPITAL_BASED_OUTPATIENT_CLINIC_OR_DEPARTMENT_OTHER): Admission: RE | Admit: 2022-04-15 | Payer: Medicare Other | Source: Ambulatory Visit

## 2022-04-15 ENCOUNTER — Encounter: Payer: Self-pay | Admitting: Physical Therapy

## 2022-04-15 VITALS — BP 132/68 | HR 87 | Temp 98.2°F | Ht 66.0 in | Wt 203.4 lb

## 2022-04-15 DIAGNOSIS — R42 Dizziness and giddiness: Secondary | ICD-10-CM

## 2022-04-15 DIAGNOSIS — R262 Difficulty in walking, not elsewhere classified: Secondary | ICD-10-CM | POA: Diagnosis not present

## 2022-04-15 DIAGNOSIS — I701 Atherosclerosis of renal artery: Secondary | ICD-10-CM

## 2022-04-15 DIAGNOSIS — R252 Cramp and spasm: Secondary | ICD-10-CM

## 2022-04-15 DIAGNOSIS — R293 Abnormal posture: Secondary | ICD-10-CM | POA: Diagnosis not present

## 2022-04-15 DIAGNOSIS — R2681 Unsteadiness on feet: Secondary | ICD-10-CM | POA: Diagnosis not present

## 2022-04-15 DIAGNOSIS — S0990XA Unspecified injury of head, initial encounter: Secondary | ICD-10-CM | POA: Diagnosis not present

## 2022-04-15 DIAGNOSIS — I10 Essential (primary) hypertension: Secondary | ICD-10-CM | POA: Diagnosis not present

## 2022-04-15 DIAGNOSIS — E039 Hypothyroidism, unspecified: Secondary | ICD-10-CM | POA: Diagnosis not present

## 2022-04-15 DIAGNOSIS — E785 Hyperlipidemia, unspecified: Secondary | ICD-10-CM | POA: Diagnosis not present

## 2022-04-15 DIAGNOSIS — E1169 Type 2 diabetes mellitus with other specified complication: Secondary | ICD-10-CM | POA: Diagnosis not present

## 2022-04-15 DIAGNOSIS — M7989 Other specified soft tissue disorders: Secondary | ICD-10-CM | POA: Diagnosis not present

## 2022-04-15 DIAGNOSIS — R278 Other lack of coordination: Secondary | ICD-10-CM | POA: Insufficient documentation

## 2022-04-15 DIAGNOSIS — E1142 Type 2 diabetes mellitus with diabetic polyneuropathy: Secondary | ICD-10-CM

## 2022-04-15 DIAGNOSIS — H8110 Benign paroxysmal vertigo, unspecified ear: Secondary | ICD-10-CM | POA: Diagnosis not present

## 2022-04-15 LAB — CUP PACEART REMOTE DEVICE CHECK
Battery Remaining Longevity: 173 mo
Battery Voltage: 3.13 V
Brady Statistic AP VP Percent: 0.12 %
Brady Statistic AP VS Percent: 8.98 %
Brady Statistic AS VP Percent: 0.03 %
Brady Statistic AS VS Percent: 90.88 %
Brady Statistic RA Percent Paced: 8.91 %
Brady Statistic RV Percent Paced: 0.22 %
Date Time Interrogation Session: 20230811122151
Implantable Lead Implant Date: 20221107
Implantable Lead Implant Date: 20221107
Implantable Lead Location: 753859
Implantable Lead Location: 753860
Implantable Lead Model: 5076
Implantable Lead Model: 5076
Implantable Pulse Generator Implant Date: 20221107
Lead Channel Impedance Value: 361 Ohm
Lead Channel Impedance Value: 475 Ohm
Lead Channel Impedance Value: 513 Ohm
Lead Channel Impedance Value: 532 Ohm
Lead Channel Pacing Threshold Amplitude: 0.625 V
Lead Channel Pacing Threshold Amplitude: 0.625 V
Lead Channel Pacing Threshold Pulse Width: 0.4 ms
Lead Channel Pacing Threshold Pulse Width: 0.4 ms
Lead Channel Sensing Intrinsic Amplitude: 11.75 mV
Lead Channel Sensing Intrinsic Amplitude: 11.75 mV
Lead Channel Sensing Intrinsic Amplitude: 4.75 mV
Lead Channel Sensing Intrinsic Amplitude: 4.75 mV
Lead Channel Setting Pacing Amplitude: 1.5 V
Lead Channel Setting Pacing Amplitude: 2 V
Lead Channel Setting Pacing Pulse Width: 0.4 ms
Lead Channel Setting Sensing Sensitivity: 1.2 mV

## 2022-04-15 MED ORDER — ALENDRONATE SODIUM 70 MG PO TABS: 70.0000 mg | ORAL_TABLET | ORAL | 11 refills | Status: DC

## 2022-04-15 MED ORDER — HYOSCYAMINE SULFATE 0.125 MG PO TBDP
0.1250 mg | ORAL_TABLET | Freq: Four times a day (QID) | ORAL | 5 refills | Status: DC | PRN
Start: 2022-04-15 — End: 2022-11-12

## 2022-04-15 MED ORDER — MECLIZINE HCL 12.5 MG PO TABS: 12.5000 mg | ORAL_TABLET | Freq: Three times a day (TID) | ORAL | 1 refills | Status: DC | PRN

## 2022-04-15 NOTE — Progress Notes (Signed)
Phone [phone] In person visit   Subjective:   [name] is a 81 y.o. year old very pleasant female patient who presents for/with See problem oriented charting Chief Complaint  Patient presents with   Follow-up   Hypertension    Pt states she is still experiencing dizziness and has therapy today.   fingers crossing     Pt c/o fingers "drawing up/crossing over".   Past Medical History-  Patient Active Problem List   Diagnosis Date Noted   Osteoporosis 01/22/2022    Priority: High   Pacemaker 07/09/2021    Priority: High   Mobitz type 2 second degree atrioventricular block 06/20/2021    Priority: High   PAT (paroxysmal atrial tachycardia) ([place]) 06/20/2021    Priority: High   PAF (paroxysmal atrial fibrillation) ([place]) 10/16/2020    Priority: High   PAD (peripheral artery disease) ([place]) 01/14/2017    Priority: High   Type 2 diabetes mellitus with peripheral neuropathy ([place]) 10/09/2016    Priority: High   Mesenteric artery stenosis ([facility]) 04/20/2014    Priority: High   Abdominal pain, epigastric 01/19/2014    Priority: High   Congestive heart failure with LV diastolic dysfunction, NYHA class 2 ([place])     Priority: High   Celiac artery stenosis: 60% by Duplex - 90-95% by cath - Med Rx 11/27/2013    Priority: High   Renal artery stenosis ([place]) 08/29/2012    Priority: High   CAD S/P percutaneous coronary angioplasty: pRCA BMS, mLAD BMS overlapped prox with DES for ISR 07/30/2010    Priority: High   B12 deficiency 01/10/2022    Priority: Medium    Continuous severe abdominal pain 10/11/2016    Priority: Medium    Exertional dyspnea 03/16/2015    Priority: Medium    Idiopathic chronic pancreatitis suspected 03/10/2014    Priority: Medium    Abnormal CT of liver-possible cirrhosis 03/10/2014    Priority: Medium    Stasis edema of bilateral lower extremity     Priority: Medium    Essential hypertension     Priority: Medium    Hyperlipidemia associated with type 2  diabetes mellitus ([place])     Priority: Medium    Myalgia due to statin 12/29/2011    Priority: Medium    GERD (gastroesophageal reflux disease) 01/24/2009    Priority: Medium    Hypothyroid 11/12/2007    Priority: Medium    Irritable bowel syndrome 11/12/2007    Priority: Medium    PANCREAS DIVISUM 11/12/2007    Priority: Medium    Allergic rhinitis 01/10/2022    Priority: Low   Pneumothorax 07/10/2021    Priority: Low   Syncope and collapse 06/20/2021    Priority: Low   Hallux valgus (acquired), right foot 08/17/2018    Priority: Low   Costochondritis 01/21/2017    Priority: Low   Onychomycosis due to dermatophyte 11/16/2015    Priority: Low   Abnormal electrocardiogram during exercise stress test 03/15/2015    Priority: Low   Dysphagia 03/10/2014    Priority: Low   Long term current use of antithrombotics/antiplatelets - clopidogrel 03/10/2014    Priority: Low   Carotid stenosis 02/27/2014    Priority: Low   Leg cramps 01/19/2014    Priority: Low   Palpitations 01/12/2014    Priority: Low   Diarrhea due to malabsorption 01/24/2009    Priority: Low   Renal calculus 11/12/2007    Priority: Low   HIATAL HERNIA 11/20/2005    Priority: Low  Medications- reviewed and updated Current Outpatient Medications  Medication Sig Dispense Refill   acetaminophen (TYLENOL) 500 MG tablet Take 1,000-1,500 mg by mouth every 6 (six) hours as needed for moderate pain.     amiodarone (PACERONE) 200 MG tablet Take 400 mg  (2 tablets) one dose as need for a fast heart rate ,if after 12 hours still present take 200 mg ( 1 tablet)  one dose  for each episode 30 tablet 6   apixaban (ELIQUIS) 5 MG TABS tablet Take 1 tablet (5 mg total) by mouth 2 (two) times daily. 180 tablet 2   Artificial Tear Solution (TEARS RENEWED OP) Apply 1 drop to eye daily as needed (dry eyes).     carvedilol (COREG) 6.25 MG tablet TAKE 1 TABLET BY MOUTH TWICE DAILY WITH A MEAL. MAY TAKE AN EXTRA TABLET DAILY IF  HAVING PALPATIONS (Patient taking differently: Take 6.25 mg by mouth 2 (two) times daily with a meal.) 225 tablet 3   colchicine 0.6 MG tablet Take by mouth.     cyanocobalamin (,VITAMIN B-12,) 1000 MCG/ML injection 1000 mcg injection once per month. Please provide syringes as well 1 mL 15   diclofenac sodium (VOLTAREN) 1 % GEL Apply 2 g topically 4 (four) times daily as needed (pain).     furosemide (LASIX) 80 MG tablet TAKE 1 TABLET(80 MG) by mouth once a day , may take an additional 80 mg if needed for swelling (Patient taking differently: 80 mg. TAKE 1 TABLET(80 MG) by mouth twice a day , may take an additional 80 mg if needed for swelling) 180 tablet 3   indapamide (LOZOL) 2.5 MG tablet TAKE ONE TABLET BY MOUTH DAILY, 30 MINUTES BEFORE DAILY FUROSEMIDE. 90 tablet 2   isosorbide mononitrate (IMDUR) 30 MG 24 hr tablet TAKE 1 TABLET BY MOUTH EVERY DAY 90 tablet 3   lidocaine (XYLOCAINE) 5 % ointment Apply 1 application. topically as needed. 35.44 g 0   losartan (COZAAR) 50 MG tablet Take 1 tablet (50 mg total) by mouth daily. 90 tablet 3   nitroGLYCERIN (NITROSTAT) 0.4 MG SL tablet DISSOLVE 1 TABLET UNDER THE TONGUE EVERY 5 MINUTES AS NEEDED FOR CHEST PAIN 25 tablet 4   spironolactone (ALDACTONE) 25 MG tablet TAKE 1 TABLET BY MOUTH AS DIRECTED. TAKE 1 TABLET ON MONDAYS, WEDNESDAYS, AND FRIDAYS AT 6 PM. 45 tablet 3   alendronate (FOSAMAX) 70 MG tablet Take 1 tablet (70 mg total) by mouth every 7 (seven) days. Take with a full glass of water on an empty stomach. 5 tablet 11   hyoscyamine (ANASPAZ) 0.125 MG TBDP disintergrating tablet Place 1 tablet (0.125 mg total) under the tongue every 6 (six) hours as needed for cramping. 30 tablet 5   meclizine (ANTIVERT) 12.5 MG tablet Take 1 tablet (12.5 mg total) by mouth 3 (three) times daily as needed for dizziness. 30 tablet 1   No current facility-administered medications for this visit.     Objective:  BP 132/68   Pulse 87   Temp 98.2 F (36.8 C)    Ht '5\' 6"'$  (1.676 m)   Wt 203 lb 6.4 oz (92.3 kg)   SpO2 97%   BMI 32.83 kg/m  Gen: NAD, resting comfortably CV: RRR no murmurs rubs or gallops Lungs: CTAB no crackles, wheeze, rhonchi  Ext: trace to 1+ edema Skin: warm, dry No scalp tenderness     Assessment and Plan   # Finger cramping S:has noted a "drawing up/crossing over" sensation in fingers -previously with  right index finger welling- had planned to check uric acid- doesn't tolerate prednisone well  A/P: did not see this occur today but worth checking electrolytes and magnesium with labs today  #Vertigo leading to fall and head trauma S: Patient with positional vertigo that resolved with sitting or holding head still at March 04, 2022 visit.  Suspected BPPV.  Reassuring neurological exam.  We sent in meclizine but discussed potential risks.  Referred to physical therapy and had 1 visit- has another visit coming up. Fell 2 weeks ago- recommended she use a walker or at least a cane- she picked up watermelon and when went to turn around felt room spinning- fell on left hip- much improved- offered x-ray but she declines . Also slightly hit her head- some headaches A/P: Certainly still sounds like positional vertigo but with recent fall and head trauma as well as intermittent headaches-want to rule out bleed-stat CT ordered of the head.  Considered MRI of the brain to investigate vertigo further but has pacemaker and not sure if MRI compatible-in addition would likely take several weeks to get this done and can get CT done faster which would rule out significant bleed as well as significant underlying mass contributing to vertigo or stroke-we discussed would not rule out all masses and stroke so -Refilled meclizine and also stressed importance of using assistive device like cane  #Hyperlipidemia-poorly controlled on no medication.  In light of CAD and PAD and statin myalgias-patient seems somewhat more interested today in potentially  trialing PCSK9 inhibitors-she agrees to discuss with cardiology next visit-I think that is an excellent idea  # Diabetes S: Medication: Typically diet controlled Exercise and diet-she is trying to remain active-she asks what she can do to lose weight and we discussed some potential options with focus on eating to cut caloric intake-perhaps portion size with half plate method Lab Results  Component Value Date   HGBA1C 5.9 01/10/2022   HGBA1C 6.0 09/01/2007  A/P:  hopefully stable- update A1c today. Continue current meds for now   #hypertension S: Medication:  lasix 80 mg (BID if needed for edema- mainly daily twice daily), indapaminde 2.5 mg, imdur 30 mg, losartan 50 mg, spironolactone 25 mg Home readings BP #s: Usually 478G at home systolic BP Readings from Last 3 Encounters:  04/15/22 132/68  03/29/22 (!) 130/58  03/04/22 126/60  A/P: Blood pressure is well controlled today-consistent with home readings.  I did receive a message later that blood pressure was elevated in physical therapy and patient had not taken medication yet today-could have potentially had a salty lunch   #hypothyroidism S: listed on problem list but she does not take any medication Lab Results  Component Value Date   TSH 1.96 01/10/2022  A/P: hopefully stable- update TSH today. Continue  without meds for now   -I may remove this from the problem list-appears to be a potential air  # Osteoporosis S: Last DEXA: Confirmed osteoporosis 01/22/2022 with worst T score -2.9  Medication (bisphosphonate or prolia): Supposed to have Fosamax started 02/04/2022 but she does not think she is taking  Calcium: '1200mg'$  (through diet ok) recommended  Vitamin D: 1000 units a day recommended-take A/P: Suspect poor control-not clear why cardiology stopped medication on medication list but patient states he is not taking but she is open to restarting-I sent this back and and reinforced calcium and vitamin D and weightbearing  exercise  #Prior swollen red joints-update uric acid level-if uric acid significantly elevated may need to start  medication like allopurinol with concern for gout  Recommended follow up: Return in about 14 weeks (around 07/22/2022) for followup or sooner if needed.Schedule b4 you leave. Future Appointments  Date Time Provider [place]  04/16/2022 10:00 AM [facility]  04/23/2022  9:30 AM Marcelina Morel, PT [facility]  04/29/2022 11:20 AM Croitoru, Dani Gobble, MD [facility]  04/29/2022  1:15 PM Marcelina Morel, PT [facility]  05/07/2022 10:15 AM Marcelina Morel, PT [facility]  07/12/2022  7:00 AM [facility] DEVICE REMOTES [facility]  07/22/2022 10:20 AM Marin Olp, MD [facility]  10/11/2022  7:00 AM [facility] DEVICE REMOTES [facility]  03/10/2023 10:30 AM [facility] HEALTH COACH [facility]    Lab/Order associations:   ICD-10-CM   1. Traumatic injury of head, initial encounter  S09.90XA CT HEAD WO CONTRAST (5MM)    2. Benign paroxysmal positional vertigo, unspecified laterality  H81.10 meclizine (ANTIVERT) 12.5 MG tablet    3. Essential hypertension  I10     4. Hyperlipidemia associated with type 2 diabetes mellitus ([place])  E11.69    E78.5     5. Vertigo  R42 CT HEAD WO CONTRAST (5MM)    6. Type 2 diabetes mellitus with peripheral neuropathy (HCC)  E11.42 CBC with Differential/Platelet    Comprehensive metabolic panel    Hemoglobin A1c    7. Hypothyroidism, unspecified type  E03.9 TSH    8. Swollen finger  M79.89 Uric acid    9. Cramping of hands  R25.2 Magnesium      Meds ordered this encounter  Medications   hyoscyamine (ANASPAZ) 0.125 MG TBDP disintergrating tablet    Sig: Place 1 tablet (0.125 mg total) under the tongue every 6 (six) hours as needed for cramping.    Dispense:  30 tablet    Refill:  5   meclizine (ANTIVERT) 12.5 MG tablet    Sig: Take 1 tablet (12.5 mg total) by mouth 3 (three) times daily  as needed for dizziness.    Dispense:  30 tablet    Refill:  1   alendronate (FOSAMAX) 70 MG tablet    Sig: Take 1 tablet (70 mg total) by mouth every 7 (seven) days. Take with a full glass of water on an empty stomach.    Dispense:  5 tablet    Refill:  11    Return precautions advised.  Garret Reddish, MD

## 2022-04-15 NOTE — Patient Instructions (Addendum)
Schedule a lab visit at the check out desk within 2 weeks. Return for future fasting labs meaning nothing but water after midnight please. Ok to take your medications with water.   Team please give calcium in diet handout- needs at least '1200mg'$  a day between foods you eat and supplement  Start fosamax 70 mg once a week Administer first thing in the morning and >30 minutes before the first food, beverage (except plain water), or other medication of the day. Do not take with mineral water or with other beverages. Stay upright (not to lie down) for at least 30 minutes after taking medicine and until after first food of the day (to reduce irritation). Must be taken with 6 to 8 oz of plain water. The tablet should be swallowed whole; do not chew or suck.  I also recommend regular weight bearing exercise as able. Weight-bearing aerobic activities involve doing aerobic exercise on your feet, with your bones supporting your weight. Examples include walking, dancing, low-impact aerobics, elliptical training machines, stair climbing and gardening. -but we have to be careful about falls- consider cane/walker to help with balance until things improve  CT scan ordered to make sure no bleeding with the eliquis  Recommended follow up: Return in about 14 weeks (around 07/22/2022) for followup or sooner if needed.Schedule b4 you leave.

## 2022-04-15 NOTE — Therapy (Signed)
OUTPATIENT PHYSICAL THERAPY VESTIBULAR EVALUATION     Patient Name: [name] MRN: [id number] DOB:1941/01/15, 81 y.o., female Today's Date: 04/15/2022  PCP: Adah Salvage PROVIDER: Garret Reddish   PT End of Session - 04/15/22 1355     Visit Number 2    Date for PT Re-Evaluation 06/10/22    PT Start Time 1316    PT Stop Time 6073    PT Time Calculation (min) 39 min    Activity Tolerance Other (comment)   Limited due to high BP   Behavior During Therapy [facility] for tasks assessed/performed              Past Medical History:  Diagnosis Date   Arthritis    "fingers, right shoulder" (09/09/2017)   Atrial fibrillation ([place]) 12/2021   New diagnosis noted on pacemaker interrogation.   Back pain with radiation    Chronic Back Pain - mutliple surgeries (including tumor removal)   Bilateral edema of lower extremity    Chronic, likely related to venous stasis   Bradycardia    Pacemaker placed   CAD S/P percutaneous coronary angioplasty    a) LHC: 07/30/10. -- 3.0x18m Integrity BMS to pRCA & 2.5x 144mIntegrity BMS mLAD(@ D2).  b) Class III-IV Angina 03/2011: LHC- ISR in LAD BMS -- prox overlapping Promus DES 2.5x1679mnd PTCA of jailed D2 ostium-prox 80%. c) 02/03/12:  LHC- patent stents.  Jailed diagonal. with stable flow; d) Peri-OP NSTEMI 04/2012 - LHC in 12/'13 -    Celiac artery stenosis ([place]CC[place]  12/2016 - STENT placement   Complication of anesthesia    "used to wake up wild years ago" (09/09/2017)   Congestive heart failure with LV diastolic dysfunction, NYHA class 2 (HCCCar[place]  06/13/10:  last 2D echo-  EF >55%, Mild TR, Mod Conc LVH - Grade 1 diastolic dysfunction (abnormal relaxation) --> LVEDP on Cath 28 mmHg & mild 2nd Pulm HTN   Diet-controlled type 2 diabetes mellitus (HCCB[place]  Diverticulitis of colon (without mention of hemorrhage)(562.11)    Dyslipidemia, goal LDL below 70    Intolerant to statins   GERD (gastroesophageal reflux disease)    Hepatitis ~ 1957    "yellow jaundice" (01/14/2017)   Hepatomegaly    Hiatal hernia    History of blood transfusion 04/2012   "when I had a heart attack"   History of kidney stones    "I've got a stone embedded in one of my kidneys" (09/09/2017)   History of stomach ulcers "years ago"   Irritable bowel syndrome (IBS)    Labile essential hypertension    Partially related to RAS   Mesenteric artery stenosis (HCC)    95% Celiac Artery - ostial, 20-30% SMA.  Bilateral Renal A: L RA stent patent, R RA 20-30% -- Conservative Management   Mobitz type 2 second degree AV block    Status post PPM placement   NSTEMI (non-ST elevated myocardial infarction) (HCCFre[place]8/2013   Unclear the details, apparently this was postoperative from her back surgery that she was cleared for my last saw her in June.  Reportedly had stents placed   Pancreas divisum    On pancreatic enzyme   PAT (paroxysmal atrial tachycardia) (HCC)    Renal artery stenosis (HCCEleele011; 12/'13   a) Angiogram 02/03/12:  50-60%L RA stenosis, 40% R R Inferior artery; b) 12/'13: S/P L RA Stent ([facility]) 6.0 mm x 15 mm; c) Renal Duplex 10/2013: <60% L RA, <  60 R RA, ~60% SMA & Celiac A.   S/P placement of cardiac pacemaker 07/2021   [employer]   Stricture and stenosis of esophagus    Unspecified gastritis and gastroduodenitis without mention of hemorrhage    Past Surgical History:  Procedure Laterality Date   APPENDECTOMY     BACK SURGERY     BREAST DUCTAL SYSTEM EXCISION     right   BREAST SURGERY Right    CARDIAC CATHETERIZATION N/A 03/16/2015   Procedure: Left Heart Cath and Coronary Angiography;  Surgeon: Leonie Man, MD;  Location: [place] [facility];  Service: Cardiovascular; Widely patent m-dLAD stents as wellas pRCA stent.  High LVEDP, small Diag & Om vessels with moderate stenosis    Cardiac Event Monitor  01/2017   Mostly NSR with occasional sinus tachycardia and rare bradycardia. No A. fib. No PND or PSVT. Rare PACs and PVCs.   CATARACT  EXTRACTION W/ INTRAOCULAR LENS  IMPLANT, BILATERAL Bilateral    CHOLECYSTECTOMY OPEN     COLONOSCOPY  03/01/2009   normal    CORONARY ANGIOPLASTY WITH STENT PLACEMENT  07/30/2010   3.0x79m Integrity BMS to RCA and 2.5x 168mIntegrity BMS LAD.     CORONARY ANGIOPLASTY WITH STENT PLACEMENT  03/18/2011   Cutting Balloon PTCA of D2 (jailed - 80% ostial stenosis), DES PCI of mid LAD ISR - > Promus DES 2.5 x 16 mm postdilated to 2.8 mm) covering the proximal portion of the previous stent   DOBUTAMINE STRESS ECHO  03/07/2015   [facility] (ordered for pre-op evaluation for EUS/ERCP --> abnormal EKG:  strreesss test shhoowwed 1 mm ST segment depressions downsloping. No wall motion abnormalities at peak exercise or at rest. Diastolic dysfunction was noted but normal systolic function - EF greater than 55%. No bouts of regurgitation or stenosis. Resting hypertension with exaggerated response   ESOPHAGOGASTRODUODENOSCOPY  04/08/2012   ESOPHAGOGASTRODUODENOSCOPY (EGD) WITH ESOPHAGEAL DILATION  X 2   FRACTURE SURGERY     HAMMER TOE SURGERY Bilateral    "took bone off the top of 2nd toe on each foot"   HIP SURGERY Left    "something to do w/my back"   JOINT REPLACEMENT     KNEE SURGERY Left    "had fluid drained off it a couple times"   LEFT HEART CATH AND CORONARY ANGIOGRAPHY  07/30/2010   severe LAD-diagonal 80%, moderate to severe proximal RCA.  Mean PAP 15 mmHg.  PCWP mean 17 mmHg.  RVP 45/11 mmHg.  PAP 47/24 mmHg, mean 31 mmHg.  LVEDP 27 mmHg   LEFT HEART CATHETERIZATION WITH CORONARY ANGIOGRAM N/A 02/03/2012   Procedure: LEFT HEART CATHETERIZATION WITH CORONARY ANGIOGRAM;  Surgeon: DaLeonie ManMD;  [facility]; widely patent LAD and RCA stents.  Patent D2 ostium.  Moderate L Renal A stenosis (56%), R Renal A 40-50% t.  LVEDP 20 mmHg.   LEFT HEART CATHETERIZATION WITH CORONARY ANGIOGRAM N/A 05/12/2014   Procedure: LEFT HEART CATHETERIZATION WITH CORONARY ANGIOGRAM;  Surgeon: JoLorretta HarpMD;  [facility];  Service: Cardiovascular: Stable CAD. Patent stents. Patent renal artery stent   LEFT HEART CATHETERIZATION WITH CORONARY ANGIOGRAM  08/2012   Peri-Op MI @ [facility] -- 40% ostial D1, patent LAD stents, 10% RCA ISR   LEFT HEART CATHETERIZATION WITH CORONARY ANGIOGRAM   03/18/2011   70% ISR of LAD stent just after D2 (D2 has ostial 80 to 90% stenosis.  20 to 30% ISR RCA.  EDP  elevated at 28 mmHg   NM MYOVIEW LTD  12/2016   LOW RISK study. No ischemia or infarction. EF 65-75%.   NM MYOVIEW LTD  01/05/2020    LOW RISK. EF 60-65%.  No EKG changes.  No infarct, no ischemia.   PACEMAKER IMPLANT N/A 07/09/2021   Procedure: PACEMAKER IMPLANT;  Surgeon: Sanda Klein, MD;  Location: [place] [facility];  Service: Cardiovascular;  Laterality: N/A;   PANCREAS SURGERY     "stent in my pancreas"; Dr Pershing Proud   PERCUTANEOUS PINNING PHALANX FRACTURE OF HAND Left ~ 2013   PERIPHERAL VASCULAR BALLOON ANGIOPLASTY  09/09/2017   Procedure: PERIPHERAL VASCULAR BALLOON ANGIOPLASTY;  Surgeon: Serafina Mitchell, MD;  Location: [place] [facility];  Service: Cardiovascular;;  Celiac instent   PERIPHERAL VASCULAR INTERVENTION  01/14/2017   Procedure: Peripheral Vascular Intervention;  Surgeon: Serafina Mitchell, MD;  Location: [place] [facility];  Service: Cardiovascular;;  mesentric   RENAL ARTERY STENT Left 08/2012   @ [facility] - 6.0 mm x 15 mm   SPINE SURGERY     tumor removed 07/2010; Redo Surgery 04/2012; Sacroiliac Sgx 08/2013   TOTAL ABDOMINAL HYSTERECTOMY     TOTAL KNEE ARTHROPLASTY Left ~ 2012   TRANSTHORACIC ECHOCARDIOGRAM  07/10/2021   (Postop PPM): Normal LV size and function.  EF 60 to 65%.  No RWMA.  GR 1 DD.  Mild LA dilation.  Normal RV size and function.  Normal RAP.  Normal aortic and mitral valves.  No pericardial effusion.   VISCERAL ANGIOGRAM N/A 05/12/2014   Procedure: VISCERAL ANGIOGRAM;  Surgeon: Lorretta Harp, MD;  Location: [facility] CATH  LAB;  25% ostial proximal celiac artery with downward takeoff.  23% proximal SMA and 56% proximal IMA.  Left renal artery stent widely patent.  Right renal artery is 20 to 30% proximal stenosis.   VISCERAL ANGIOGRAPHY N/A 01/14/2017   Procedure: Mesenteric  Angiography;  Surgeon: Serafina Mitchell, MD;  Location: [place] [facility];  Service: Cardiovascular;  Laterality: N/A;   VISCERAL ANGIOGRAPHY N/A 09/09/2017   Procedure: VISCERAL ANGIOGRAPHY;  Surgeon: Serafina Mitchell, MD;  Location: [place] [facility] LAB;  Service: Cardiovascular;  Laterality: N/A;   Patient Active Problem List   Diagnosis Date Noted   Osteoporosis 01/22/2022   B12 deficiency 01/10/2022   Allergic rhinitis 01/10/2022   Pneumothorax 07/10/2021   Pacemaker 07/09/2021   Syncope and collapse 06/20/2021   Mobitz type 2 second degree atrioventricular block 06/20/2021   PAT (paroxysmal atrial tachycardia) ([place]) 06/20/2021   PAF (paroxysmal atrial fibrillation) ([place]) 10/16/2020   Hallux valgus (acquired), right foot 08/17/2018   Costochondritis 01/21/2017   PAD (peripheral artery disease) ([place]) 01/14/2017   Continuous severe abdominal pain 10/11/2016   Type 2 diabetes mellitus with peripheral neuropathy ([place]) 10/09/2016   Onychomycosis due to dermatophyte 11/16/2015   Exertional dyspnea 03/16/2015   Abnormal electrocardiogram during exercise stress test 03/15/2015   Mesenteric artery stenosis ([place]) 04/20/2014   Idiopathic chronic pancreatitis suspected 03/10/2014   Abnormal CT of liver-possible cirrhosis 03/10/2014   Dysphagia 03/10/2014   Long term current use of antithrombotics/antiplatelets - clopidogrel 03/10/2014   Carotid stenosis 02/27/2014   Abdominal pain, epigastric 01/19/2014   Leg cramps 01/19/2014   Palpitations 01/12/2014   Stasis edema of bilateral lower extremity    Congestive heart failure with LV diastolic dysfunction, NYHA class 2 ([place])    Essential hypertension    Hyperlipidemia associated with  type 2 diabetes mellitus ([place])  Celiac artery stenosis: 60% by Duplex - 90-95% by cath - Med Rx 11/27/2013   Renal artery stenosis (HCC) 08/29/2012   Myalgia due to statin 12/29/2011   CAD S/P percutaneous coronary angioplasty: pRCA BMS, mLAD BMS overlapped prox with DES for ISR 07/30/2010   GERD (gastroesophageal reflux disease) 01/24/2009   Diarrhea due to malabsorption 01/24/2009   Hypothyroid 11/12/2007   Irritable bowel syndrome 11/12/2007   Renal calculus 11/12/2007   PANCREAS DIVISUM 11/12/2007   HIATAL HERNIA 11/20/2005    ONSET DATE: 03/04/22  REFERRING DIAG:  Diagnosis  H81.10 (ICD-10-CM) - Benign paroxysmal positional vertigo, unspecified laterality    THERAPY DIAG:  Dizziness and giddiness  Other lack of coordination  Abnormal posture  Unsteadiness on feet  Difficulty in walking, not elsewhere classified  Rationale for Evaluation and Treatment Rehabilitation  SUBJECTIVE:   SUBJECTIVE STATEMENT: Patient reports she had a fall at home after she tried to reach to the floor to pick up a watermelon. When she tried to stand, she got dizziness, with the room spinning. She fell backwards, hitting her L hip. She also continues to feel dizziness when first rising in the morning, makes her nauseous. She feels the antivert helps. She did not take any today. Pt accompanied by: self  PERTINENT HISTORY: S: Noted vertigo when she got up on Sunday when was getting out of bed. Had room spinning sensation and felt ill on her stomach. Fell back into bed at least twice. She was having to hold on walls to walk. Also has had lower appetite and has felt somewhat unbalanced. Everytime she stands up gets spinning sensation. Does not get similar when she is in bed- doesn't roll much though ROS- No facial or extremity weakness. No slurred words or trouble swallowing. no blurry vision or double vision. No  new paresthesias- known neuropathy. No confusion or word finding difficulties. No new   hearing loss reported. No tinnitus above baseline reported. No chest pain or shortness of breath above baseline A/P: Patient with positional vertigo that resolved with sitting or holding head still.  She is not sure if she can tolerate Dix-Hallpike maneuver and this was not performed-she did have some nystagmus with certain head movements including standing up quickly - Suspect BPPV.  Reassuring neurological exam - Sent in meclizine but discussed potential risks - Refer to vestibular rehab - Discussed if new or worsening symptoms certainly seek care but I do not strongly suspect mass or stroke which was her main concern     PAIN:  Are you having pain? Yes: NPRS scale: 5/10 Pain location: R foot Pain description: has gout Aggravating factors: standing, WB Relieving factors: medication  PRECAUTIONS: None  WEIGHT BEARING RESTRICTIONS No  FALLS: Has patient fallen in last 6 months? Yes. Number of falls a couple per week.  LIVING ENVIRONMENT: Lives with: lives with their spouse Lives in: House/apartment Stairs: Yes: External: 3 steps; on left going up Has following equipment at home: Single point cane  PLOF: Independent  PATIENT GOALS: Decrease dizziness and falls.  OBJECTIVE:   DIAGNOSTIC FINDINGS: UTI assessment neg,   COGNITION: Overall cognitive status: Within functional limits for tasks assessed   SENSATION: Not tested Patient reports B PN  MUSCLE TONE: WNL   POSTURE: rounded shoulders, forward head, decreased lumbar lordosis, increased thoracic kyphosis, and posterior pelvic tilt   Cervical ROM:    Active A/PROM (deg) eval  Flexion 80%  Extension 10  Right lateral flexion 50  Left lateral flexion 50  Right  rotation 60  Left rotation 60  (Blank rows = not tested)  STRENGTH: BLE strength 4/5 BLE  GAIT: Gait pattern: step through pattern, decreased arm swing- Right, decreased arm swing- Left, decreased step length- Right, decreased step length- Left, and  shuffling Distance walked: 80 Assistive device utilized: None Level of assistance: Complete Independence Comments: Fearful and cautious  FUNCTIONAL TESTs:  Functional gait assessment: TBA   VESTIBULAR ASSESSMENT   GENERAL OBSERVATION: Patient walked in with no issues. No observable dizziness during history or reporting symptoms.    SYMPTOM BEHAVIOR:   Subjective history: Started after having Covid 2 years ago, seems to be progressing, causing regular falls.   Non-Vestibular symptoms:  none   Type of dizziness: Unsteady with head/body turns, "World moves", and "Swimmyheaded"   Frequency: Frequently in the morning   Duration: minutes   Aggravating factors: Induced by position change: supine to sit, Induced by motion: bending down to the ground, and Worse in the morning   Relieving factors: head stationary   Progression of symptoms: worse   OCULOMOTOR EXAM:   Ocular Alignment: normal   Ocular ROM: No Limitations and Caused 3-4 dizziness, resolved in < 1 minute   Spontaneous Nystagmus: right beating   Gaze-Induced Nystagmus: right beating with right gaze   Smooth Pursuits: intact   Saccades: intact and slow   Convergence/Divergence: 20 cm     VESTIBULAR - OCULAR REFLEX:    Slow VOR: Normal   VOR Cancellation: Unable to Maintain Gaze   Head-Impulse Test: HIT Right: negative HIT Left: negative   Dynamic Visual Acuity: Not able to be assessed    POSITIONAL TESTING: Right Dix-Hallpike: no nystagmus Left Dix-Hallpike: no nystagmus Right Roll Test: TBA Left Roll Test: TBA Right Sidelying: TBA Left Sidelying: TBA Deep Head Hang: TBA   OTHOSTATICS: TBA  FUNCTIONAL GAIT: Functional gait assessment: TBA  PATIENT EDUCATION: Education details: POC Person educated: Patient Education method: Explanation Education comprehension: verbalized understanding  Today's Treatment Orthostatic BPs Supine-176/66 Sit-Immediate 176/72 with mild dizziness 4/10 improved with time, 3  minutes-170/70 Stand-Immediate 128/66 with 7/10 dizziness that did drop to 3/10, 3 minutes 180/68   GOALS: Goals reviewed with patient? Yes  SHORT TERM GOALS: Target date: 04/29/2022   I with basic HEP Baseline: Goal status: INITIAL  LONG TERM GOALS: Target date: 06/10/2022    I with final HEP Baseline:  Goal status: INITIAL  2.  Patient will report dizziness of <3/10 first thing in the morning. Baseline: Fluctuating dizziness, worse in the morning when first getting out of bed and bending forward. Goal status: INITIAL  3.  Patient will score at least 24/30 on FGA Baseline: TBA Goal status: INITIAL  4.  Patient will report dizziness < 3/10 with bending forward. Baseline: Up to 8/10 Goal status: INITIAL  5.  Patient will report no falls or near falls during functional mobility x 2 weeks in a row. Baseline: Multiple near falls. Goal status: INITIAL  ASSESSMENT:  CLINICAL IMPRESSION: Patient reported a fall almost 2 weeks ago. She is still sore in her tailbone and bruised her L hip. She skipped her BP meds this morning as she had a Dr appointment. Therapist moved to assess orthostatic Bps. It was alarmingly high in every position. Patient instructed to return home, take her medication, and re-assess her BP, if her diastolic numbers have not dropped below 150, call her Dr or go to the ER. Further instructed to always take her BPP meds first thing in the morning, and monitor her  BP throughout the day, documenting the numbers for reference. Dr Ansel Bong office informed of the numbers identified. Educated patient that it is possible that her Bps are contributing to her dizziness per her symptom description as well as her BP dropped from sit to stand, with 7/10 dizziness noted.   OBJECTIVE IMPAIRMENTS Abnormal gait, decreased activity tolerance, decreased balance, decreased coordination, difficulty walking, dizziness, and postural dysfunction.   ACTIVITY LIMITATIONS carrying,  lifting, bending, squatting, and locomotion level  PARTICIPATION LIMITATIONS: meal prep, cleaning, laundry, driving, and shopping  PERSONAL FACTORS Age and Past/current experiences are also affecting patient's functional outcome.   REHAB POTENTIAL: Good  CLINICAL DECISION MAKING: Evolving/moderate complexity  EVALUATION COMPLEXITY: Moderate   PLAN: PT FREQUENCY: 1-2x/week  PT DURATION: 10 weeks  PLANNED INTERVENTIONS: Therapeutic exercises, Therapeutic activity, Neuromuscular re-education, Balance training, Gait training, Patient/Family education, Self Care, Joint mobilization, Vestibular training, Canalith repositioning, and Visual/preceptual remediation/compensation  PLAN FOR NEXT SESSION: See how her BP is doing, initiate HEP for eye movement.   Marcelina Morel, DPT 04/15/2022, 1:56 PM

## 2022-04-16 ENCOUNTER — Telehealth: Payer: Self-pay | Admitting: Family Medicine

## 2022-04-16 ENCOUNTER — Ambulatory Visit (HOSPITAL_BASED_OUTPATIENT_CLINIC_OR_DEPARTMENT_OTHER)
Admission: RE | Admit: 2022-04-16 | Discharge: 2022-04-16 | Disposition: A | Discharge status: Home / Self Care | Payer: Medicare Other | Source: Ambulatory Visit | Attending: Family Medicine | Admitting: Family Medicine

## 2022-04-16 ENCOUNTER — Other Ambulatory Visit: Payer: Medicare Other

## 2022-04-16 DIAGNOSIS — S0990XA Unspecified injury of head, initial encounter: Secondary | ICD-10-CM | POA: Diagnosis not present

## 2022-04-16 DIAGNOSIS — R252 Cramp and spasm: Secondary | ICD-10-CM

## 2022-04-16 DIAGNOSIS — E039 Hypothyroidism, unspecified: Secondary | ICD-10-CM

## 2022-04-16 DIAGNOSIS — R519 Headache, unspecified: Secondary | ICD-10-CM | POA: Diagnosis not present

## 2022-04-16 DIAGNOSIS — E1142 Type 2 diabetes mellitus with diabetic polyneuropathy: Secondary | ICD-10-CM

## 2022-04-16 DIAGNOSIS — R42 Dizziness and giddiness: Secondary | ICD-10-CM | POA: Insufficient documentation

## 2022-04-16 DIAGNOSIS — E1169 Type 2 diabetes mellitus with other specified complication: Secondary | ICD-10-CM

## 2022-04-16 DIAGNOSIS — I10 Essential (primary) hypertension: Secondary | ICD-10-CM

## 2022-04-16 NOTE — Telephone Encounter (Signed)
Ok please update me once triage complete

## 2022-04-16 NOTE — Telephone Encounter (Signed)
Caller states: -Following OV on 08/14, pt's BP rose to 175/65 when at her rehabilitation appoitnment. Due to this they did not see her but suggested she log her BP readings.  - This morning her BP was 175/75. She has been having dizziness and fatigue.   I have arranged for triage nurse to call patient directly since caller is not with her.

## 2022-04-16 NOTE — Telephone Encounter (Signed)
FYI

## 2022-04-16 NOTE — Telephone Encounter (Signed)
Patient Name: [name] WVP Gender: Female DOB: Oct 20, 1940 Age: 81 Y 17 D Return Phone Number: [phone] (Primary) Address: City/ State/ Zip: [place] [address] Client [place] [facility] [place] Client Site [place] [facility] [place] Day Provider Garret Reddish- MD Contact Type Call Who Is Calling Patient / Member / Family / Caregiver Call Type Triage / Clinical Relationship To Patient Self Return Phone Number [phone] (Primary) Chief Complaint Dizziness Reason for Call Symptomatic / Request for [place] states her blood pressure is 175/65. She was told to let the doctor know when her bp gets high. She has been having dizziness and fatigue. Her blood pressure has been elevated since last night and she would like to speak with a nurse. Additional Comment I spoke with a clinician who spoke with the patient's husband. Translation No Nurse Assessment Nurse: Ottis Stain, RN, Sherrie Date/Time (Eastern Time): 04/16/2022 4:27:45 PM Confirm and document reason for call. If symptomatic, describe symptoms. ---Caller states blood pressure is 175/68 a couple of hours ago. States has been having dizziness (vertigo) and fatigue. States had CT today but they were unable to get blood when went to lab. States takes BP medication. HA's are more frequent but does not incapciate. Does the patient have any new or worsening symptoms? ---Yes Will a triage be completed? ---Yes Related visit to physician within the last 2 weeks? ---No Does the PT have any chronic conditions? (i.e. diabetes, asthma, this includes High risk factors for pregnancy, etc.) ---Yes List chronic conditions. ---HTN, CHF, Pacemaker Is this a behavioral health or substance abuse call? ---No PLEASE NOTE: All timestamps contained within this report are represented as [place] Standard Time. CONFIDENTIALTY NOTICE: This fax transmission is intended  only for the addressee. It contains information that is legally privileged, confidential or otherwise protected from use or disclosure. If you are not the intended recipient, you are strictly prohibited from reviewing, disclosing, copying using or disseminating any of this information or taking any action in reliance on or regarding this information. If you have received this fax in error, please notify us immediately by telephone so that we can arrange for its return to [place]. Phone: [phone], Toll-Free: [phone], Fax: [phone] Page: 2 of 2 Call Id: [id number] Guidelines Guideline Title Affirmed Question Affirmed Notes Nurse Date/Time Eilene Ghazi Time) Blood Pressure - High Systolic BP >= 315 OR Diastolic >= 400 Ottis Stain, RN, Sherrie 04/16/2022 4:33:27 PM Disp. Time Eilene Ghazi Time) Disposition Final User 04/16/2022 4:40:26 PM SEE PCP WITHIN 3 DAYS Yes Ottis Stain, RN, [place] Final Disposition 04/16/2022 4:40:26 PM SEE PCP WITHIN 3 DAYS Yes Ottis Stain, RN, Sherrie Caller Disagree/Comply Personal assistant Understands Yes PreDisposition Home Care Care Advice Given Per Guideline SEE PCP WITHIN 3 DAYS: * You need to be seen within 2 or 3 days. * EAT HEALTHY: Eat a diet rich in fresh fruits and vegetables, dietary fiber, non-animal protein (e.g., soy), and low-fat dairy products. Avoid foods with a high content of saturated fat or cholesterol. * DECREASE SODIUM INTAKE: Aim to eat less than 2.4 g (100 mmol) of sodium each day. Unfortunately 75% of the salt in the average person's diet is in pre-processed foods. CALL BACK IF: * Weakness or numbness of the face, arm or leg on one side of the body occurs * Difficulty walking, difficulty talking, or severe headache occurs * Your blood pressure is over 180/110 * You become worse Comments User: Evlyn Clines, RN Date/Time Eilene Ghazi Time): 04/16/2022 4:41:07 PM Cold transferred  to office to make appt. Instructed needs to be seen in the next 2-3 days.  Verbalized understanding. Referrals REFERRED TO PCP OFFICE

## 2022-04-16 NOTE — Telephone Encounter (Signed)
I spoke with patient at the end of the day today and asked her to recheck blood pressure-after 5 minutes of rest blood pressure was down to 131/61.  She plans to keep her appointment on Friday unless blood pressure continues to be in this lower range (she can cancel visit if blood pressure stays lower).  We discussed possibly could have been elevated from missing blood pressure medicine on Monday when trying to make it to appointments and then medicine trying to reequilibrated in system  She can check her blood pressure feels dizzy again.  Encouraged her to wait to check blood pressure at least an hour or 2 after taking her medicines tomorrow.  Also blood pressure is significantly elevated she can take losartan 50 mg and additional dose-has not had potassium issues with this and spironolactone

## 2022-04-18 ENCOUNTER — Other Ambulatory Visit: Payer: Self-pay

## 2022-04-18 NOTE — Telephone Encounter (Signed)
Labs re-ordered

## 2022-04-18 NOTE — Telephone Encounter (Signed)
Patient states: - She has experienced a lowering in her BP. Due to this she will be canceling her OV for 08/18 with PCP.   Patient requests: - Lab orders from 08/15 be re-order so she can have these collected at [facility] lab instead. States lab tech was unable to get any blood drawn

## 2022-04-18 NOTE — Addendum Note (Signed)
Addended by: Clyde Lundborg A on: 04/18/2022 03:09 PM   Modules accepted: Orders

## 2022-04-19 ENCOUNTER — Ambulatory Visit: Payer: Medicare Other | Admitting: Family Medicine

## 2022-04-23 ENCOUNTER — Ambulatory Visit: Payer: Medicare Other | Admitting: Physical Therapy

## 2022-04-23 ENCOUNTER — Encounter: Payer: Self-pay | Admitting: Physical Therapy

## 2022-04-23 DIAGNOSIS — R278 Other lack of coordination: Secondary | ICD-10-CM | POA: Diagnosis not present

## 2022-04-23 DIAGNOSIS — R2681 Unsteadiness on feet: Secondary | ICD-10-CM

## 2022-04-23 DIAGNOSIS — R293 Abnormal posture: Secondary | ICD-10-CM

## 2022-04-23 DIAGNOSIS — R42 Dizziness and giddiness: Secondary | ICD-10-CM | POA: Diagnosis not present

## 2022-04-23 DIAGNOSIS — R262 Difficulty in walking, not elsewhere classified: Secondary | ICD-10-CM | POA: Diagnosis not present

## 2022-04-23 NOTE — Therapy (Signed)
OUTPATIENT PHYSICAL THERAPY VESTIBULAR EVALUATION     Patient Name: [name] MRN: [id number] DOB:11-29-1940, 81 y.o., female Today's Date: 04/23/2022  PCP: Adah Salvage PROVIDER: Garret Reddish   PT End of Session - 04/23/22 0945     Visit Number 3    Date for PT Re-Evaluation 06/10/22    PT Start Time 0924    PT Stop Time 1006    PT Time Calculation (min) 42 min    Activity Tolerance Other (comment)   Limited due to high BP   Behavior During Therapy [facility] for tasks assessed/performed               Past Medical History:  Diagnosis Date   Arthritis    "fingers, right shoulder" (09/09/2017)   Atrial fibrillation ([place]) 12/2021   New diagnosis noted on pacemaker interrogation.   Back pain with radiation    Chronic Back Pain - mutliple surgeries (including tumor removal)   Bilateral edema of lower extremity    Chronic, likely related to venous stasis   Bradycardia    Pacemaker placed   CAD S/P percutaneous coronary angioplasty    a) LHC: 07/30/10. -- 3.0x35m Integrity BMS to pRCA & 2.5x 151mIntegrity BMS mLAD(@ D2).  b) Class III-IV Angina 03/2011: LHC- ISR in LAD BMS -- prox overlapping Promus DES 2.5x1650mnd PTCA of jailed D2 ostium-prox 80%. c) 02/03/12:  LHC- patent stents.  Jailed diagonal. with stable flow; d) Peri-OP NSTEMI 04/2012 - LHC in 12/'13 -    Celiac artery stenosis ([place]  12/2016 - STENT placement   Complication of anesthesia    "used to wake up wild years ago" (09/09/2017)   Congestive heart failure with LV diastolic dysfunction, NYHA class 2 ([place]  06/13/10:  last 2D echo-  EF >55%, Mild TR, Mod Conc LVH - Grade 1 diastolic dysfunction (abnormal relaxation) --> LVEDP on Cath 28 mmHg & mild 2nd Pulm HTN   Diet-controlled type 2 diabetes mellitus ([place]  Diverticulitis of colon (without mention of hemorrhage)(562.11)    Dyslipidemia, goal LDL below 70    Intolerant to statins   GERD (gastroesophageal reflux disease)    Hepatitis ~  1957   "yellow jaundice" (01/14/2017)   Hepatomegaly    Hiatal hernia    History of blood transfusion 04/2012   "when I had a heart attack"   History of kidney stones    "I've got a stone embedded in one of my kidneys" (09/09/2017)   History of stomach ulcers "years ago"   Irritable bowel syndrome (IBS)    Labile essential hypertension    Partially related to RAS   Mesenteric artery stenosis (HCC)    95% Celiac Artery - ostial, 20-30% SMA.  Bilateral Renal A: L RA stent patent, R RA 20-30% -- Conservative Management   Mobitz type 2 second degree AV block    Status post PPM placement   NSTEMI (non-ST elevated myocardial infarction) ([place]/2013   Unclear the details, apparently this was postoperative from her back surgery that she was cleared for my last saw her in June.  Reportedly had stents placed   Pancreas divisum    On pancreatic enzyme   PAT (paroxysmal atrial tachycardia) (HCC)    Renal artery stenosis ([id number]; 12/'13   a) Angiogram 02/03/12:  50-60%L RA stenosis, 40% R R Inferior artery; b) 12/'13: S/P L RA Stent ([facility]) 6.0 mm x 15 mm; c) Renal Duplex 10/2013: <60% L  RA, <60 R RA, ~60% SMA & Celiac A.   S/P placement of cardiac pacemaker 07/2021   [employer]   Stricture and stenosis of esophagus    Unspecified gastritis and gastroduodenitis without mention of hemorrhage    Past Surgical History:  Procedure Laterality Date   APPENDECTOMY     BACK SURGERY     BREAST DUCTAL SYSTEM EXCISION     right   BREAST SURGERY Right    CARDIAC CATHETERIZATION N/A 03/16/2015   Procedure: Left Heart Cath and Coronary Angiography;  Surgeon: Leonie Man, MD;  Location: [place] CV LAB;  Service: Cardiovascular; Widely patent m-dLAD stents as wellas pRCA stent.  High LVEDP, small Diag & Om vessels with moderate stenosis    Cardiac Event Monitor  01/2017   Mostly NSR with occasional sinus tachycardia and rare bradycardia. No A. fib. No PND or PSVT. Rare PACs and PVCs.    CATARACT EXTRACTION W/ INTRAOCULAR LENS  IMPLANT, BILATERAL Bilateral    CHOLECYSTECTOMY OPEN     COLONOSCOPY  03/01/2009   normal    CORONARY ANGIOPLASTY WITH STENT PLACEMENT  07/30/2010   3.0x30m Integrity BMS to RCA and 2.5x 121mIntegrity BMS LAD.     CORONARY ANGIOPLASTY WITH STENT PLACEMENT  03/18/2011   Cutting Balloon PTCA of D2 (jailed - 80% ostial stenosis), DES PCI of mid LAD ISR - > Promus DES 2.5 x 16 mm postdilated to 2.8 mm) covering the proximal portion of the previous stent   DOBUTAMINE STRESS ECHO  03/07/2015   [facility] (ordered for pre-op evaluation for EUS/ERCP --> abnormal EKG:  strreesss test shhoowwed 1 mm ST segment depressions downsloping. No wall motion abnormalities at peak exercise or at rest. Diastolic dysfunction was noted but normal systolic function - EF greater than 55%. No bouts of regurgitation or stenosis. Resting hypertension with exaggerated response   ESOPHAGOGASTRODUODENOSCOPY  04/08/2012   ESOPHAGOGASTRODUODENOSCOPY (EGD) WITH ESOPHAGEAL DILATION  X 2   FRACTURE SURGERY     HAMMER TOE SURGERY Bilateral    "took bone off the top of 2nd toe on each foot"   HIP SURGERY Left    "something to do w/my back"   JOINT REPLACEMENT     KNEE SURGERY Left    "had fluid drained off it a couple times"   LEFT HEART CATH AND CORONARY ANGIOGRAPHY  07/30/2010   severe LAD-diagonal 80%, moderate to severe proximal RCA.  Mean PAP 15 mmHg.  PCWP mean 17 mmHg.  RVP 45/11 mmHg.  PAP 47/24 mmHg, mean 31 mmHg.  LVEDP 27 mmHg   LEFT HEART CATHETERIZATION WITH CORONARY ANGIOGRAM N/A 02/03/2012   Procedure: LEFT HEART CATHETERIZATION WITH CORONARY ANGIOGRAM;  Surgeon: DaLeonie ManMD;  [facility]; widely patent LAD and RCA stents.  Patent D2 ostium.  Moderate L Renal A stenosis (56%), R Renal A 40-50% t.  LVEDP 20 mmHg.   LEFT HEART CATHETERIZATION WITH CORONARY ANGIOGRAM N/A 05/12/2014   Procedure: LEFT HEART CATHETERIZATION WITH CORONARY ANGIOGRAM;  Surgeon:  JoLorretta HarpMD;  [facility];  Service: Cardiovascular: Stable CAD. Patent stents. Patent renal artery stent   LEFT HEART CATHETERIZATION WITH CORONARY ANGIOGRAM  08/2012   Peri-Op MI @ [facility] -- 40% ostial D1, patent LAD stents, 10% RCA ISR   LEFT HEART CATHETERIZATION WITH CORONARY ANGIOGRAM   03/18/2011   70% ISR of LAD stent just after D2 (D2 has ostial 80 to 90% stenosis.  20 to 30% ISR RCA.  EDP elevated at 28 mmHg   NM MYOVIEW LTD  12/2016   LOW RISK study. No ischemia or infarction. EF 65-75%.   NM MYOVIEW LTD  01/05/2020    LOW RISK. EF 60-65%.  No EKG changes.  No infarct, no ischemia.   PACEMAKER IMPLANT N/A 07/09/2021   Procedure: PACEMAKER IMPLANT;  Surgeon: Sanda Klein, MD;  Location: [place] CV LAB;  Service: Cardiovascular;  Laterality: N/A;   PANCREAS SURGERY     "stent in my pancreas"; Dr Pershing Proud   PERCUTANEOUS PINNING PHALANX FRACTURE OF HAND Left ~ 2013   PERIPHERAL VASCULAR BALLOON ANGIOPLASTY  09/09/2017   Procedure: PERIPHERAL VASCULAR BALLOON ANGIOPLASTY;  Surgeon: Serafina Mitchell, MD;  Location: [place] CV LAB;  Service: Cardiovascular;;  Celiac instent   PERIPHERAL VASCULAR INTERVENTION  01/14/2017   Procedure: Peripheral Vascular Intervention;  Surgeon: Serafina Mitchell, MD;  Location: [place] CV LAB;  Service: Cardiovascular;;  mesentric   RENAL ARTERY STENT Left 08/2012   @ [facility] - 6.0 mm x 15 mm   SPINE SURGERY     tumor removed 07/2010; Redo Surgery 04/2012; Sacroiliac Sgx 08/2013   TOTAL ABDOMINAL HYSTERECTOMY     TOTAL KNEE ARTHROPLASTY Left ~ 2012   TRANSTHORACIC ECHOCARDIOGRAM  07/10/2021   (Postop PPM): Normal LV size and function.  EF 60 to 65%.  No RWMA.  GR 1 DD.  Mild LA dilation.  Normal RV size and function.  Normal RAP.  Normal aortic and mitral valves.  No pericardial effusion.   VISCERAL ANGIOGRAM N/A 05/12/2014   Procedure: VISCERAL ANGIOGRAM;  Surgeon: Lorretta Harp, MD;   Location: [facility] CATH LAB;  25% ostial proximal celiac artery with downward takeoff.  23% proximal SMA and 56% proximal IMA.  Left renal artery stent widely patent.  Right renal artery is 20 to 30% proximal stenosis.   VISCERAL ANGIOGRAPHY N/A 01/14/2017   Procedure: Mesenteric  Angiography;  Surgeon: Serafina Mitchell, MD;  Location: [place] CV LAB;  Service: Cardiovascular;  Laterality: N/A;   VISCERAL ANGIOGRAPHY N/A 09/09/2017   Procedure: VISCERAL ANGIOGRAPHY;  Surgeon: Serafina Mitchell, MD;  Location: [place] CV LAB;  Service: Cardiovascular;  Laterality: N/A;   Patient Active Problem List   Diagnosis Date Noted   Osteoporosis 01/22/2022   B12 deficiency 01/10/2022   Allergic rhinitis 01/10/2022   Pneumothorax 07/10/2021   Pacemaker 07/09/2021   Syncope and collapse 06/20/2021   Mobitz type 2 second degree atrioventricular block 06/20/2021   PAT (paroxysmal atrial tachycardia) ([place]) 06/20/2021   PAF (paroxysmal atrial fibrillation) ([place]) 10/16/2020   Hallux valgus (acquired), right foot 08/17/2018   Costochondritis 01/21/2017   PAD (peripheral artery disease) ([place]) 01/14/2017   Continuous severe abdominal pain 10/11/2016   Type 2 diabetes mellitus with peripheral neuropathy ([place]) 10/09/2016   Onychomycosis due to dermatophyte 11/16/2015   Exertional dyspnea 03/16/2015   Abnormal electrocardiogram during exercise stress test 03/15/2015   Mesenteric artery stenosis ([place]) 04/20/2014   Idiopathic chronic pancreatitis suspected 03/10/2014   Abnormal CT of liver-possible cirrhosis 03/10/2014   Dysphagia 03/10/2014   Long term current use of antithrombotics/antiplatelets - clopidogrel 03/10/2014   Carotid stenosis 02/27/2014   Abdominal pain, epigastric 01/19/2014   Leg cramps 01/19/2014   Palpitations 01/12/2014   Stasis edema of bilateral lower extremity    Congestive heart failure with LV diastolic dysfunction, NYHA class 2 ([place])    Essential hypertension    Hyperlipidemia  associated with type 2 diabetes mellitus (  [place])    Celiac artery stenosis: 60% by Duplex - 90-95% by cath - Med Rx 11/27/2013   Renal artery stenosis (HCC) 08/29/2012   Myalgia due to statin 12/29/2011   CAD S/P percutaneous coronary angioplasty: pRCA BMS, mLAD BMS overlapped prox with DES for ISR 07/30/2010   GERD (gastroesophageal reflux disease) 01/24/2009   Diarrhea due to malabsorption 01/24/2009   Hypothyroid 11/12/2007   Irritable bowel syndrome 11/12/2007   Renal calculus 11/12/2007   PANCREAS DIVISUM 11/12/2007   HIATAL HERNIA 11/20/2005    ONSET DATE: 03/04/22  REFERRING DIAG:  Diagnosis  H81.10 (ICD-10-CM) - Benign paroxysmal positional vertigo, unspecified laterality    THERAPY DIAG:  Dizziness and giddiness  Other lack of coordination  Abnormal posture  Unsteadiness on feet  Difficulty in walking, not elsewhere classified  Rationale for Evaluation and Treatment Rehabilitation  SUBJECTIVE:   SUBJECTIVE STATEMENT: Patient reports she had a fall at home after she tried to reach to the floor to pick up a watermelon. When she tried to stand, she got dizziness, with the room spinning. She fell backwards, hitting her L hip. She also continues to feel dizziness when first rising in the morning, makes her nauseous. She feels the antivert helps. She did not take any today. Pt accompanied by: self  PERTINENT HISTORY: S: Noted vertigo when she got up on Sunday when was getting out of bed. Had room spinning sensation and felt ill on her stomach. Fell back into bed at least twice. She was having to hold on walls to walk. Also has had lower appetite and has felt somewhat unbalanced. Everytime she stands up gets spinning sensation. Does not get similar when she is in bed- doesn't roll much though ROS- No facial or extremity weakness. No slurred words or trouble swallowing. no blurry vision or double vision. No  new paresthesias- known neuropathy. No confusion or word finding  difficulties. No new  hearing loss reported. No tinnitus above baseline reported. No chest pain or shortness of breath above baseline A/P: Patient with positional vertigo that resolved with sitting or holding head still.  She is not sure if she can tolerate Dix-Hallpike maneuver and this was not performed-she did have some nystagmus with certain head movements including standing up quickly - Suspect BPPV.  Reassuring neurological exam - Sent in meclizine but discussed potential risks - Refer to vestibular rehab - Discussed if new or worsening symptoms certainly seek care but I do not strongly suspect mass or stroke which was her main concern     PAIN:  Are you having pain? Yes: NPRS scale: 5/10 Pain location: R foot Pain description: has gout Aggravating factors: standing, WB Relieving factors: medication  PRECAUTIONS: None  WEIGHT BEARING RESTRICTIONS No  FALLS: Has patient fallen in last 6 months? Yes. Number of falls a couple per week.  LIVING ENVIRONMENT: Lives with: lives with their spouse Lives in: House/apartment Stairs: Yes: External: 3 steps; on left going up Has following equipment at home: Single point cane  PLOF: Independent  PATIENT GOALS: Decrease dizziness and falls.  OBJECTIVE:   DIAGNOSTIC FINDINGS: UTI assessment neg,   COGNITION: Overall cognitive status: Within functional limits for tasks assessed   SENSATION: Not tested Patient reports B PN  MUSCLE TONE: WNL   POSTURE: rounded shoulders, forward head, decreased lumbar lordosis, increased thoracic kyphosis, and posterior pelvic tilt   Cervical ROM:    Active A/PROM (deg) eval  Flexion 80%  Extension 10  Right lateral flexion 50  Left lateral  flexion 50  Right rotation 60  Left rotation 60  (Blank rows = not tested)  STRENGTH: BLE strength 4/5 BLE  GAIT: Gait pattern: step through pattern, decreased arm swing- Right, decreased arm swing- Left, decreased step length- Right, decreased  step length- Left, and shuffling Distance walked: 80 Assistive device utilized: None Level of assistance: Complete Independence Comments: Fearful and cautious  FUNCTIONAL TESTs:  Functional gait assessment: TBA   VESTIBULAR ASSESSMENT   GENERAL OBSERVATION: Patient walked in with no issues. No observable dizziness during history or reporting symptoms.    SYMPTOM BEHAVIOR:   Subjective history: Started after having Covid 2 years ago, seems to be progressing, causing regular falls.   Non-Vestibular symptoms:  none   Type of dizziness: Unsteady with head/body turns, "World moves", and "Swimmyheaded"   Frequency: Frequently in the morning   Duration: minutes   Aggravating factors: Induced by position change: supine to sit, Induced by motion: bending down to the ground, and Worse in the morning   Relieving factors: head stationary   Progression of symptoms: worse   OCULOMOTOR EXAM:   Ocular Alignment: normal   Ocular ROM: No Limitations and Caused 3-4 dizziness, resolved in < 1 minute   Spontaneous Nystagmus: right beating   Gaze-Induced Nystagmus: right beating with right gaze   Smooth Pursuits: intact   Saccades: intact and slow   Convergence/Divergence: 20 cm     VESTIBULAR - OCULAR REFLEX:    Slow VOR: Normal   VOR Cancellation: Unable to Maintain Gaze   Head-Impulse Test: HIT Right: negative HIT Left: negative   Dynamic Visual Acuity: Not able to be assessed    POSITIONAL TESTING: Right Dix-Hallpike: no nystagmus Left Dix-Hallpike: no nystagmus Right Roll Test: TBA Left Roll Test: TBA Right Sidelying: TBA Left Sidelying: TBA Deep Head Hang: TBA   OTHOSTATICS: TBA  FUNCTIONAL GAIT: Functional gait assessment: TBA  PATIENT EDUCATION: Education details: POC Person educated: Patient Education method: Explanation Education comprehension: verbalized understanding  Today's Treatment 04/23/22  BP upon arrival-163/83, HR 74. She rested x 5 minutes and it dropped to  145/80, HR 73.  Resting nystagmus noted, R beating. Dizziness reported at 7/10, rose immediately upon attempting saccades, did not drop, so further treatment deferred.  04/15/22 Orthostatic BPs Supine-176/66 Sit-Immediate 176/72 with mild dizziness 4/10 improved with time, 3 minutes-170/70 Stand-Immediate 128/66 with 7/10 dizziness that did drop to 3/10, 3 minutes 180/68   GOALS: Goals reviewed with patient? Yes  SHORT TERM GOALS: Target date: 04/29/2022   I with basic HEP Baseline: Goal status: ongoing  LONG TERM GOALS: Target date: 06/10/2022    I with final HEP Baseline:  Goal status: INITIAL  2.  Patient will report dizziness of <3/10 first thing in the morning. Baseline: Fluctuating dizziness, worse in the morning when first getting out of bed and bending forward. Goal status: ongoing  3.  Patient will score at least 24/30 on FGA Baseline: TBA Goal status: INITIAL  4.  Patient will report dizziness < 3/10 with bending forward. Baseline: Up to 8/10 Goal status: INITIAL  5.  Patient will report no falls or near falls during functional mobility x 2 weeks in a row. Baseline: Multiple near falls. Goal status: INITIAL  ASSESSMENT:  CLINICAL IMPRESSION: Patient arrived feeling dizzy. BP was very high again, decreased with 5 minutes of deep breathing. Attempted to address her dizziness, but she did not tolerate it and BP once again rose. Encouraged to contact her Dr, and therapist provided update to Dr Yong Channel  via message.   OBJECTIVE IMPAIRMENTS Abnormal gait, decreased activity tolerance, decreased balance, decreased coordination, difficulty walking, dizziness, and postural dysfunction.   ACTIVITY LIMITATIONS carrying, lifting, bending, squatting, and locomotion level  PARTICIPATION LIMITATIONS: meal prep, cleaning, laundry, driving, and shopping  PERSONAL FACTORS Age and Past/current experiences are also affecting patient's functional outcome.   REHAB POTENTIAL:  Good  CLINICAL DECISION MAKING: Evolving/moderate complexity  EVALUATION COMPLEXITY: Moderate   PLAN: PT FREQUENCY: 1-2x/week  PT DURATION: 10 weeks  PLANNED INTERVENTIONS: Therapeutic exercises, Therapeutic activity, Neuromuscular re-education, Balance training, Gait training, Patient/Family education, Self Care, Joint mobilization, Vestibular training, Canalith repositioning, and Visual/preceptual remediation/compensation  PLAN FOR NEXT SESSION: See how her BP is doing, initiate HEP for eye movement. Assess horizontal and ant canals   Marcelina Morel, DPT 04/23/2022, 11:48 AM

## 2022-04-29 ENCOUNTER — Encounter: Payer: Self-pay | Admitting: Cardiovascular Disease

## 2022-04-29 ENCOUNTER — Encounter: Payer: Self-pay | Admitting: Physical Therapy

## 2022-04-29 ENCOUNTER — Ambulatory Visit: Payer: Medicare Other | Admitting: Physical Therapy

## 2022-04-29 ENCOUNTER — Ambulatory Visit: Payer: Medicare Other | Attending: Cardiovascular Disease | Admitting: Cardiovascular Disease

## 2022-04-29 VITALS — BP 126/60 | HR 88 | Ht 68.0 in | Wt 203.6 lb

## 2022-04-29 DIAGNOSIS — R293 Abnormal posture: Secondary | ICD-10-CM

## 2022-04-29 DIAGNOSIS — I480 Paroxysmal atrial fibrillation: Secondary | ICD-10-CM | POA: Insufficient documentation

## 2022-04-29 DIAGNOSIS — Z950 Presence of cardiac pacemaker: Secondary | ICD-10-CM | POA: Diagnosis not present

## 2022-04-29 DIAGNOSIS — I5030 Unspecified diastolic (congestive) heart failure: Secondary | ICD-10-CM | POA: Diagnosis not present

## 2022-04-29 DIAGNOSIS — I441 Atrioventricular block, second degree: Secondary | ICD-10-CM | POA: Diagnosis not present

## 2022-04-29 DIAGNOSIS — I25119 Atherosclerotic heart disease of native coronary artery with unspecified angina pectoris: Secondary | ICD-10-CM | POA: Insufficient documentation

## 2022-04-29 DIAGNOSIS — I701 Atherosclerosis of renal artery: Secondary | ICD-10-CM

## 2022-04-29 DIAGNOSIS — R7303 Prediabetes: Secondary | ICD-10-CM | POA: Insufficient documentation

## 2022-04-29 DIAGNOSIS — I10 Essential (primary) hypertension: Secondary | ICD-10-CM | POA: Diagnosis not present

## 2022-04-29 DIAGNOSIS — R262 Difficulty in walking, not elsewhere classified: Secondary | ICD-10-CM

## 2022-04-29 DIAGNOSIS — R42 Dizziness and giddiness: Secondary | ICD-10-CM

## 2022-04-29 DIAGNOSIS — D6869 Other thrombophilia: Secondary | ICD-10-CM | POA: Diagnosis not present

## 2022-04-29 DIAGNOSIS — R278 Other lack of coordination: Secondary | ICD-10-CM | POA: Diagnosis not present

## 2022-04-29 DIAGNOSIS — R2681 Unsteadiness on feet: Secondary | ICD-10-CM | POA: Diagnosis not present

## 2022-04-29 NOTE — Progress Notes (Signed)
Cardiology Office Note:    Date:  04/29/2022   ID:  [name], DOB [birth year], MRN [id number]  PCP:  Marin Olp, MD   [place] Cardiologist:  Glenetta Hew, MD Electrophysiologist:  Sanda Klein, MD     Referring MD: Marin Olp, MD   No chief complaint on file.   History of Present Illness:    KRISTEEN LANTZ is a 81 y.o. female with a hx of CAD s/p remote PCI (BMS RCA, overlapping BMS and DES mid LAD), chronic heart failure with preserved left ventricular ejection fraction, hyperlipidemia, type 2 diabetes mellitus, paroxysmal atrial tachycardia as well as intermittent episodes of second-degree AV block Mobitz type II for which she underwent implantation of a dual-chamber permanent pacemaker on July 09, 2021.  The pacemaker procedure (implanted in left subclavian area), was complicated by pleuritic chest pain and the chest x-ray showed a very small right-sided pneumothorax.  The suspicion was that she has an atrial lead perforation that actually tracked through the pericardium to the right pleura.  She did not require chest tube placement, it resolved spontaneously.  Subsequent pacemaker monitoring has shown repeated episodes of mildly symptomatic paroxysmal atrial fibrillation lasting for several hours at a time.  Her biggest complaint today is that of dizziness.  It has a clear positional pattern.  It occurs after she gets up from a sitting position or when she gets out of the car after driving.  On 1 occasion she was bending over, trying to lift up a watermelon and felt so dizzy that she fell back and dropped the melon.  On the other hand, she has been trying to go to outpatient rehabilitation and has been denied participation because every time she goes her systolic blood pressure is high in the 170s.  She states that at home her blood pressure is highly variable, the lowest blood pressure she's recorded was around 116.  She denies full-blown  syncope.  She has not had shortness of breath either at rest or with exertion, no orthopnea, PND or edema.  She has occasional episodes of chest discomfort.  She has not taken any nitroglycerin, the symptoms have resolved spontaneously.  Pacemaker interrogation shows normal device function.  She only has 9% atrial pacing and 0.2% ventricular pacing.  The burden of atrial fibrillation is 1.3%.  Most episodes are measured in less than 10 minutes, but some have lasted for 4-12 hours.  Lead parameters are within normal range.  She has taken amiodarone "as needed" when she has racing heartbeats.  She last took this on Saturday.  There is no relationship between the doses of amiodarone and her dizziness.   Past Medical History:  Diagnosis Date   Arthritis    "fingers, right shoulder" (09/09/2017)   Atrial fibrillation ([place]) 12/2021   New diagnosis noted on pacemaker interrogation.   Back pain with radiation    Chronic Back Pain - mutliple surgeries (including tumor removal)   Bilateral edema of lower extremity    Chronic, likely related to venous stasis   Bradycardia    Pacemaker placed   CAD S/P percutaneous coronary angioplasty    a) LHC: 07/30/10. -- 3.0x71m Integrity BMS to pRCA & 2.5x 155mIntegrity BMS mLAD(@ D2).  b) Class III-IV Angina 03/2011: LHC- ISR in LAD BMS -- prox overlapping Promus DES 2.5x1616mnd PTCA of jailed D2 ostium-prox 80%. c) 02/03/12:  LHC- patent stents.  Jailed diagonal. with stable flow; d) Peri-OP NSTEMI 04/2012 - LHC in  12/'13 -    Celiac artery stenosis ([place])    12/2016 - STENT placement   Complication of anesthesia    "used to wake up wild years ago" (09/09/2017)   Congestive heart failure with LV diastolic dysfunction, NYHA class 2 ([place])    06/13/10:  last 2D echo-  EF >55%, Mild TR, Mod Conc LVH - Grade 1 diastolic dysfunction (abnormal relaxation) --> LVEDP on Cath 28 mmHg & mild 2nd Pulm HTN   Diet-controlled type 2 diabetes mellitus ([place])    Diverticulitis of  colon (without mention of hemorrhage)(562.11)    Dyslipidemia, goal LDL below 70    Intolerant to statins   GERD (gastroesophageal reflux disease)    Hepatitis ~ 1957   "yellow jaundice" (01/14/2017)   Hepatomegaly    Hiatal hernia    History of blood transfusion 04/2012   "when I had a heart attack"   History of kidney stones    "I've got a stone embedded in one of my kidneys" (09/09/2017)   History of stomach ulcers "years ago"   Irritable bowel syndrome (IBS)    Labile essential hypertension    Partially related to RAS   Mesenteric artery stenosis (HCC)    95% Celiac Artery - ostial, 20-30% SMA.  Bilateral Renal A: L RA stent patent, R RA 20-30% -- Conservative Management   Mobitz type 2 second degree AV block    Status post PPM placement   NSTEMI (non-ST elevated myocardial infarction) ([place]) 04/2012   Unclear the details, apparently this was postoperative from her back surgery that she was cleared for my last saw her in June.  Reportedly had stents placed   Pancreas divisum    On pancreatic enzyme   PAT (paroxysmal atrial tachycardia) (HCC)    Renal artery stenosis ([place]) 2011; 12/'13   a) Angiogram 02/03/12:  50-60%L RA stenosis, 40% R R Inferior artery; b) 12/'13: S/P L RA Stent ([facility]) 6.0 mm x 15 mm; c) Renal Duplex 10/2013: <60% L RA, <60 R RA, ~60% SMA & Celiac A.   S/P placement of cardiac pacemaker 07/2021   [employer]   Stricture and stenosis of esophagus    Unspecified gastritis and gastroduodenitis without mention of hemorrhage     Past Surgical History:  Procedure Laterality Date   APPENDECTOMY     BACK SURGERY     BREAST DUCTAL SYSTEM EXCISION     right   BREAST SURGERY Right    CARDIAC CATHETERIZATION N/A 03/16/2015   Procedure: Left Heart Cath and Coronary Angiography;  Surgeon: Leonie Man, MD;  Location: [place] [facility];  Service: Cardiovascular; Widely patent m-dLAD stents as wellas pRCA stent.  High LVEDP, small Diag & Om vessels with moderate  stenosis    Cardiac Event Monitor  01/2017   Mostly NSR with occasional sinus tachycardia and rare bradycardia. No A. fib. No PND or PSVT. Rare PACs and PVCs.   CATARACT EXTRACTION W/ INTRAOCULAR LENS  IMPLANT, BILATERAL Bilateral    CHOLECYSTECTOMY OPEN     COLONOSCOPY  03/01/2009   normal    CORONARY ANGIOPLASTY WITH STENT PLACEMENT  07/30/2010   3.0x55m Integrity BMS to RCA and 2.5x 123mIntegrity BMS LAD.     CORONARY ANGIOPLASTY WITH STENT PLACEMENT  03/18/2011   Cutting Balloon PTCA of D2 (jailed - 80% ostial stenosis), DES PCI of mid LAD ISR - > Promus DES 2.5 x 16 mm postdilated to 2.8 mm) covering the proximal portion of the previous stent  DOBUTAMINE STRESS ECHO  03/07/2015   [facility] (ordered for pre-op evaluation for EUS/ERCP --> abnormal EKG:  strreesss test shhoowwed 1 mm ST segment depressions downsloping. No wall motion abnormalities at peak exercise or at rest. Diastolic dysfunction was noted but normal systolic function - EF greater than 55%. No bouts of regurgitation or stenosis. Resting hypertension with exaggerated response   ESOPHAGOGASTRODUODENOSCOPY  04/08/2012   ESOPHAGOGASTRODUODENOSCOPY (EGD) WITH ESOPHAGEAL DILATION  X 2   FRACTURE SURGERY     HAMMER TOE SURGERY Bilateral    "took bone off the top of 2nd toe on each foot"   HIP SURGERY Left    "something to do w/my back"   JOINT REPLACEMENT     KNEE SURGERY Left    "had fluid drained off it a couple times"   LEFT HEART CATH AND CORONARY ANGIOGRAPHY  07/30/2010   severe LAD-diagonal 80%, moderate to severe proximal RCA.  Mean PAP 15 mmHg.  PCWP mean 17 mmHg.  RVP 45/11 mmHg.  PAP 47/24 mmHg, mean 31 mmHg.  LVEDP 27 mmHg   LEFT HEART CATHETERIZATION WITH CORONARY ANGIOGRAM N/A 02/03/2012   Procedure: LEFT HEART CATHETERIZATION WITH CORONARY ANGIOGRAM;  Surgeon: Leonie Man, MD;  Location: [facility] CATH LAB; widely patent LAD and RCA stents.  Patent D2 ostium.  Moderate L Renal A stenosis (56%), R Renal A 40-50% t.   LVEDP 20 mmHg.   LEFT HEART CATHETERIZATION WITH CORONARY ANGIOGRAM N/A 05/12/2014   Procedure: LEFT HEART CATHETERIZATION WITH CORONARY ANGIOGRAM;  Surgeon: Lorretta Harp, MD;  Location: [facility] CATH LAB;  Service: Cardiovascular: Stable CAD. Patent stents. Patent renal artery stent   LEFT HEART CATHETERIZATION WITH CORONARY ANGIOGRAM  08/2012   Peri-Op MI @ [facility] -- 40% ostial D1, patent LAD stents, 10% RCA ISR   LEFT HEART CATHETERIZATION WITH CORONARY ANGIOGRAM   03/18/2011   70% ISR of LAD stent just after D2 (D2 has ostial 80 to 90% stenosis.  20 to 30% ISR RCA.  EDP elevated at 28 mmHg   NM MYOVIEW LTD  12/2016   LOW RISK study. No ischemia or infarction. EF 65-75%.   NM MYOVIEW LTD  01/05/2020    LOW RISK. EF 60-65%.  No EKG changes.  No infarct, no ischemia.   PACEMAKER IMPLANT N/A 07/09/2021   Procedure: PACEMAKER IMPLANT;  Surgeon: Sanda Klein, MD;  Location: [place] [facility];  Service: Cardiovascular;  Laterality: N/A;   PANCREAS SURGERY     "stent in my pancreas"; Dr Pershing Proud   PERCUTANEOUS PINNING PHALANX FRACTURE OF HAND Left ~ 2013   PERIPHERAL VASCULAR BALLOON ANGIOPLASTY  09/09/2017   Procedure: PERIPHERAL VASCULAR BALLOON ANGIOPLASTY;  Surgeon: Serafina Mitchell, MD;  Location: [place] [facility];  Service: Cardiovascular;;  Celiac instent   PERIPHERAL VASCULAR INTERVENTION  01/14/2017   Procedure: Peripheral Vascular Intervention;  Surgeon: Serafina Mitchell, MD;  Location: [place] [facility];  Service: Cardiovascular;;  mesentric   RENAL ARTERY STENT Left 08/2012   @ [facility] - 6.0 mm x 15 mm   SPINE SURGERY     tumor removed 07/2010; Redo Surgery 04/2012; Sacroiliac Sgx 08/2013   TOTAL ABDOMINAL HYSTERECTOMY     TOTAL KNEE ARTHROPLASTY Left ~ 2012   TRANSTHORACIC ECHOCARDIOGRAM  07/10/2021   (Postop PPM): Normal LV size and function.  EF 60 to 65%.  No RWMA.  GR 1 DD.  Mild LA dilation.  Normal RV size and function.  Normal RAP.  Normal  aortic and mitral valves.  No pericardial effusion.   VISCERAL ANGIOGRAM N/A 05/12/2014   Procedure: VISCERAL ANGIOGRAM;  Surgeon: Lorretta Harp, MD;  Location[facility] CATH LAB;  25% ostial proximal celiac artery with downward takeoff.  23% proximal SMA and 56% proximal IMA.  Left renal artery stent widely patent.  Right renal artery is 20 to 30% proximal stenosis.   VISCERAL ANGIOGRAPHY N/A 01/14/2017   Procedure: Mesenteric  Angiography;  Surgeon: Serafina Mitchell, MD;  Location: [place] [facility];  Service: Cardiovascular;  Laterality: N/A;   VISCERAL ANGIOGRAPHY N/A 09/09/2017   Procedure: VISCERAL ANGIOGRAPHY;  Surgeon: Serafina Mitchell, MD;  Location: [place] [facility];  Service: Cardiovascular;  Laterality: N/A;    Current Medications: Current Meds  Medication Sig   acetaminophen (TYLENOL) 500 MG tablet Take 1,000-1,500 mg by mouth every 6 (six) hours as needed for moderate pain.   alendronate (FOSAMAX) 70 MG tablet Take 1 tablet (70 mg total) by mouth every 7 (seven) days. Take with a full glass of water on an empty stomach.   amiodarone (PACERONE) 200 MG tablet Take 400 mg  (2 tablets) one dose as need for a fast heart rate ,if after 12 hours still present take 200 mg ( 1 tablet)  one dose  for each episode   apixaban (ELIQUIS) 5 MG TABS tablet Take 1 tablet (5 mg total) by mouth 2 (two) times daily.   Artificial Tear Solution (TEARS RENEWED OP) Apply 1 drop to eye daily as needed (dry eyes).   carvedilol (COREG) 6.25 MG tablet TAKE 1 TABLET BY MOUTH TWICE DAILY WITH A MEAL. MAY TAKE AN EXTRA TABLET DAILY IF HAVING PALPATIONS (Patient taking differently: Take 6.25 mg by mouth 2 (two) times daily with a meal.)   cyanocobalamin (,VITAMIN B-12,) 1000 MCG/ML injection 1000 mcg injection once per month. Please provide syringes as well   diclofenac sodium (VOLTAREN) 1 % GEL Apply 2 g topically 4 (four) times daily as needed (pain).   furosemide (LASIX) 80 MG tablet TAKE 1 TABLET(80 MG) by  mouth once a day , may take an additional 80 mg if needed for swelling (Patient taking differently: 80 mg. TAKE 1 TABLET(80 MG) by mouth twice a day , may take an additional 80 mg if needed for swelling)   indapamide (LOZOL) 2.5 MG tablet TAKE ONE TABLET BY MOUTH DAILY, 30 MINUTES BEFORE DAILY FUROSEMIDE.   isosorbide mononitrate (IMDUR) 30 MG 24 hr tablet TAKE 1 TABLET BY MOUTH EVERY DAY   losartan (COZAAR) 50 MG tablet Take 1 tablet (50 mg total) by mouth daily.   meclizine (ANTIVERT) 12.5 MG tablet Take 1 tablet (12.5 mg total) by mouth 3 (three) times daily as needed for dizziness.   spironolactone (ALDACTONE) 25 MG tablet TAKE 1 TABLET BY MOUTH AS DIRECTED. TAKE 1 TABLET ON MONDAYS, WEDNESDAYS, AND FRIDAYS AT 6 PM.     Allergies:   Codeine phosphate, Contrast media [iodinated contrast media], Penicillin g, Iodine, Prednisone, Statins, and Tramadol   Social History   Socioeconomic History   Marital status: Married    Spouse name: Not on file   Number of children: 1   Years of education: Not on file   Highest education level: Not on file  Occupational History   Occupation: gift shop owner    Employer: [employer]    Comment: She takes cakes and pies to sell, also canned vegetables and makes Jeneen Rinks enjoys.  Tobacco Use  Smoking status: Never   Smokeless tobacco: Never  Vaping Use   Vaping Use: Never used  Substance and Sexual Activity   Alcohol use: No   Drug use: No   Sexual activity: Not Currently  Other Topics Concern   Not on file  Social History Narrative   Married 12 years in 2023. 1 child Conne in [place].  1 grandchild in [place].       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       Hobbies: cooking/baking, loves to feed people   Social Determinants of Health   Financial Resource Strain: Low  Risk  (02/26/2022)   Overall Financial Resource Strain (CARDIA)    Difficulty of Paying Living Expenses: Not hard at all  Food Insecurity: No Food Insecurity (02/26/2022)   Hunger Vital Sign    Worried About Running Out of Food in the Last Year: Never true    Ran Out of Food in the Last Year: Never true  Transportation Needs: No Transportation Needs (02/26/2022)   PRAPARE - Hydrologist (Medical): No    Lack of Transportation (Non-Medical): No  Physical Activity: Inactive (02/26/2022)   Exercise Vital Sign    Days of Exercise per Week: 0 days    Minutes of Exercise per Session: 0 min  Stress: No Stress Concern Present (02/26/2022)   [place]    Feeling of Stress : Not at all  Social Connections: Moderately Integrated (02/26/2022)   Social Connection and Isolation Panel [NHANES]    Frequency of Communication with Friends and Family: More than three times a week    Frequency of Social Gatherings with Friends and Family: More than three times a week    Attends Religious Services: More than 4 times per year    Active Member of [employer] or Organizations: No    Attends Music therapist: Never    Marital Status: Married     Family History: The patient's family history includes Cancer in her daughter and father; Colitis in her maternal grandfather; Diabetes in her brother; Heart attack in her brother, brother, and father; Heart disease in her father; Stroke in her mother. There is no history of Colon cancer or Stomach cancer.  ROS:   Please see the history of present illness.     All other systems reviewed and are negative.  EKGs/Labs/Other Studies Reviewed:    The following studies were reviewed today: Comprehensive pacemaker check in the office today.  EKG:   ECG was not checked today.  Cardiac electrogram shows atrial sensed, ventricular sensed rhythm (normal sinus  rhythm).  Recent Labs: 01/10/2022: ALT 8; BUN 27; Creatinine, Ser 0.95; Hemoglobin 12.0; Magnesium 2.3; Platelets 242.0; Potassium 4.3; Sodium 138; TSH 1.96  Recent Lipid Panel    Component Value Date/Time   CHOL 229 (H) 01/10/2022 1014   CHOL 245 (H) 09/08/2019 1045   TRIG 327.0 (H) 01/10/2022 1014   HDL 36.90 (L) 01/10/2022 1014   HDL 36 (L) 09/08/2019 1045   CHOLHDL 6 01/10/2022 1014   VLDL 65.4 (H) 01/10/2022 1014   LDLCALC 152 (H) 09/08/2019 1045   LDLDIRECT 147.0 01/10/2022 1014     Risk Assessment/Calculations:    CHA2DS2-VASc Score = 6   This indicates a 9.7% annual risk of stroke. The patient's  score is based upon: CHF History: 1 HTN History: 1 Diabetes History: 0 Stroke History: 0 Vascular Disease History: 1 Age Score: 2 Gender Score: 1          Physical Exam:    VS:  BP 126/60 (BP Location: Left Arm, Patient Position: Sitting, Cuff Size: Large)   Pulse 88   Ht '5\' 8"'$  (1.727 m)   Wt 203 lb 9.6 oz (92.4 kg)   SpO2 94%   BMI 30.96 kg/m     Wt Readings from Last 3 Encounters:  04/29/22 203 lb 9.6 oz (92.4 kg)  04/15/22 203 lb 6.4 oz (92.3 kg)  03/29/22 202 lb (91.6 kg)      General: Alert, oriented x3, no distress, healthy left subclavian pacemaker site. Head: no evidence of trauma, PERRL, EOMI, no exophtalmos or lid lag, no myxedema, no xanthelasma; normal ears, nose and oropharynx Neck: normal jugular venous pulsations and no hepatojugular reflux; brisk carotid pulses without delay and no carotid bruits Chest: clear to auscultation, no signs of consolidation by percussion or palpation, normal fremitus, symmetrical and full respiratory excursions Cardiovascular: normal position and quality of the apical impulse, regular rhythm, normal first and second heart sounds, no murmurs, rubs or gallops Abdomen: no tenderness or distention, no masses by palpation, no abnormal pulsatility or arterial bruits, normal bowel sounds, no hepatosplenomegaly Extremities:  no clubbing, cyanosis or edema; 2+ radial, ulnar and brachial pulses bilaterally; 2+ right femoral, posterior tibial and dorsalis pedis pulses; 2+ left femoral, posterior tibial and dorsalis pedis pulses; no subclavian or femoral bruits Neurological: grossly nonfocal Psych: Normal mood and affect    ASSESSMENT:    1. PAF (paroxysmal atrial fibrillation) ([place])   2. Acquired thrombophilia ([place])   3. Coronary artery disease involving native coronary artery of native heart with angina pectoris ([place])   4. Essential hypertension   5. Pre-diabetes   6. Mobitz type 2 second degree atrioventricular block   7. Pacemaker     PLAN:    In order of problems listed above:  AFib: She has infrequent paroxysmal atrial fibrillation, usually lasting for a few minutes, occasionally lasting for a few hours with an overall burden around 1%.  CHA2DS2-VASc 6-7 (age 57, gender, HTN, CAD, diast HF, +/-prediabetes).   On Eliquis.  PAT: She has good ventricular rate control during atrial fibrillation, but has some rapid rates during ectopic atrial tachycardia with 1: 1 AV conduction.  I do not think amiodarone as a "pill in the pocket" medication is likely to be successful due to its slow onset of action.  Would consider using it as a daily medication if the burden of A-fib is high. Anticoagulation: No bleeding complications.  I do not think dizziness is related to treatment with Eliquis, as she was worried about. CAD: I am not sure that she is truly describing angina pectoris.  Her symptoms occur at rest and resolve spontaneously. HTN: As before, she has a relatively low diastolic blood pressure.  I think it is most likely that her episodes of dizziness are related to orthostatic hypotension.  I would not increase her antihypertensive medications. PreDM: A1c was 5.9% earlier this year. Recheck. Avoid sugary and starchy foods. 2nd deg AVB: Requires little ventricular pacing. Pacemaker: Normal device function, continue  remote downloads every 3 months. CHF: Clinically euvolemic.  Well compensated.  If she develops the need for medications for diabetes mellitus, SGLT2 inhibitors would be the first choice.          Medication Adjustments/Labs  and Tests Ordered: Current medicines are reviewed at length with the patient today.  Concerns regarding medicines are outlined above.  Orders Placed This Encounter  Procedures   TSH   CBC   Magnesium   Uric acid   HgB A1c   Comprehensive Metabolic Panel (CMET)   No orders of the defined types were placed in this encounter.    Patient Instructions  Medication Instructions:  No changes *If you need a refill on your cardiac medications before your next appointment, please call your pharmacy*   Lab Work: Your provider would like for you to have the following labs today: CMET, CBC, URIC ACID, TSH, A1C, AND MAGNESIUM  If you have labs (blood work) drawn today and your tests are completely normal, you will receive your results only by: [place] (if you have MyChart) OR A paper copy in the mail If you have any lab test that is abnormal or we need to change your treatment, we will call you to review the results.   Testing/Procedures: None ordered   Follow-Up: At [facility], you and your health needs are our priority.  As part of our continuing mission to provide you with exceptional heart care, we have created designated Provider Care Teams.  These Care Teams include your primary Cardiologist (physician) and Advanced Practice Providers (APPs -  Physician Assistants and Nurse Practitioners) who all work together to provide you with the care you need, when you need it.  We recommend signing up for the patient portal called "MyChart".  Sign up information is provided on this After Visit Summary.  MyChart is used to connect with patients for Virtual Visits (Telemedicine).  Patients are able to view lab/test results, encounter notes, upcoming  appointments, etc.  Non-urgent messages can be sent to your provider as well.   To learn more about what you can do with MyChart, go to [email or web address].    Your next appointment:   6 month(s)  The format for your next appointment:   In Person  Provider:   Dr. Sallyanne Kuster  Important Information About Sugar         Signed, Sanda Klein, MD  04/29/2022 7:27 PM    [place]

## 2022-04-29 NOTE — Patient Instructions (Signed)
Medication Instructions:  No changes *If you need a refill on your cardiac medications before your next appointment, please call your pharmacy*   Lab Work: Your provider would like for you to have the following labs today: CMET, CBC, URIC ACID, TSH, A1C, AND MAGNESIUM  If you have labs (blood work) drawn today and your tests are completely normal, you will receive your results only by: [place] (if you have MyChart) OR A paper copy in the mail If you have any lab test that is abnormal or we need to change your treatment, we will call you to review the results.   Testing/Procedures: None ordered   Follow-Up: At [facility], you and your health needs are our priority.  As part of our continuing mission to provide you with exceptional heart care, we have created designated Provider Care Teams.  These Care Teams include your primary Cardiologist (physician) and Advanced Practice Providers (APPs -  Physician Assistants and Nurse Practitioners) who all work together to provide you with the care you need, when you need it.  We recommend signing up for the patient portal called "MyChart".  Sign up information is provided on this After Visit Summary.  MyChart is used to connect with patients for Virtual Visits (Telemedicine).  Patients are able to view lab/test results, encounter notes, upcoming appointments, etc.  Non-urgent messages can be sent to your provider as well.   To learn more about what you can do with MyChart, go to [email or web address].    Your next appointment:   6 month(s)  The format for your next appointment:   In Person  Provider:   Dr. Sallyanne Kuster  Important Information About Sugar

## 2022-04-29 NOTE — Therapy (Signed)
OUTPATIENT PHYSICAL THERAPY VESTIBULAR EVALUATION     Patient Name: [name] MRN: [id number] DOB:[birth year], 81 y.o., female Today's Date: 04/29/2022  PCP: Adah Salvage PROVIDER: Garret Reddish   PT End of Session - 04/29/22 1341     Visit Number 4    Date for PT Re-Evaluation 06/10/22    PT Start Time 1316    PT Stop Time 1356    PT Time Calculation (min) 40 min    Equipment Utilized During Treatment Gait belt    Activity Tolerance Other (comment)   limited due to dizziness.   Behavior During Therapy [facility] for tasks assessed/performed                Past Medical History:  Diagnosis Date   Arthritis    "fingers, right shoulder" (09/09/2017)   Atrial fibrillation ([place]) 12/2021   New diagnosis noted on pacemaker interrogation.   Back pain with radiation    Chronic Back Pain - mutliple surgeries (including tumor removal)   Bilateral edema of lower extremity    Chronic, likely related to venous stasis   Bradycardia    Pacemaker placed   CAD S/P percutaneous coronary angioplasty    a) LHC: 07/30/10. -- 3.0x15m Integrity BMS to pRCA & 2.5x 146mIntegrity BMS mLAD(@ D2).  b) Class III-IV Angina 03/2011: LHC- ISR in LAD BMS -- prox overlapping Promus DES 2.5x1630mnd PTCA of jailed D2 ostium-prox 80%. c) 02/03/12:  LHC- patent stents.  Jailed diagonal. with stable flow; d) Peri-OP NSTEMI 04/2012 - LHC in 12/'13 -    Celiac artery stenosis (HCCTatum  12/2016 - STENT placement   Complication of anesthesia    "used to wake up wild years ago" (09/09/2017)   Congestive heart failure with LV diastolic dysfunction, NYHA class 2 ([facility]CC[place]  06/13/10:  last 2D echo-  EF >55%, Mild TR, Mod Conc LVH - Grade 1 diastolic dysfunction (abnormal relaxation) --> LVEDP on Cath 28 mmHg & mild 2nd Pulm HTN   Diet-controlled type 2 diabetes mellitus ([facility]CCMar[place]  Diverticulitis of colon (without mention of hemorrhage)(562.11)    Dyslipidemia, goal LDL below 70    Intolerant to statins    GERD (gastroesophageal reflux disease)    Hepatitis ~ 1957   "yellow jaundice" (01/14/2017)   Hepatomegaly    Hiatal hernia    History of blood transfusion 04/2012   "when I had a heart attack"   History of kidney stones    "I've got a stone embedded in one of my kidneys" (09/09/2017)   History of stomach ulcers "years ago"   Irritable bowel syndrome (IBS)    Labile essential hypertension    Partially related to RAS   Mesenteric artery stenosis (HCC)    95% Celiac Artery - ostial, 20-30% SMA.  Bilateral Renal A: L RA stent patent, R RA 20-30% -- Conservative Management   Mobitz type 2 second degree AV block    Status post PPM placement   NSTEMI (non-ST elevated myocardial infarction) (HCC[place]8/2013   Unclear the details, apparently this was postoperative from her back surgery that she was cleared for my last saw her in June.  Reportedly had stents placed   Pancreas divisum    On pancreatic enzyme   PAT (paroxysmal atrial tachycardia) (HCC)    Renal artery stenosis (HCC[place]io[place]011; 12/'13   a) Angiogram 02/03/12:  50-60%L RA stenosis, 40% R R Inferior artery; b) 12/'13: S/P L RA Stent ([facility]) 6.0 mm  x 15 mm; c) Renal Duplex 10/2013: <60% L RA, <60 R RA, ~60% SMA & Celiac A.   S/P placement of cardiac pacemaker 07/2021   [employer]   Stricture and stenosis of esophagus    Unspecified gastritis and gastroduodenitis without mention of hemorrhage    Past Surgical History:  Procedure Laterality Date   APPENDECTOMY     BACK SURGERY     BREAST DUCTAL SYSTEM EXCISION     right   BREAST SURGERY Right    CARDIAC CATHETERIZATION N/A 03/16/2015   Procedure: Left Heart Cath and Coronary Angiography;  Surgeon: Leonie Man, MD;  Location: [place] [facility];  Service: Cardiovascular; Widely patent m-dLAD stents as wellas pRCA stent.  High LVEDP, small Diag & Om vessels with moderate stenosis    Cardiac Event Monitor  01/2017   Mostly NSR with occasional sinus tachycardia and rare bradycardia. No  A. fib. No PND or PSVT. Rare PACs and PVCs.   CATARACT EXTRACTION W/ INTRAOCULAR LENS  IMPLANT, BILATERAL Bilateral    CHOLECYSTECTOMY OPEN     COLONOSCOPY  03/01/2009   normal    CORONARY ANGIOPLASTY WITH STENT PLACEMENT  07/30/2010   3.0x55m Integrity BMS to RCA and 2.5x 139mIntegrity BMS LAD.     CORONARY ANGIOPLASTY WITH STENT PLACEMENT  03/18/2011   Cutting Balloon PTCA of D2 (jailed - 80% ostial stenosis), DES PCI of mid LAD ISR - > Promus DES 2.5 x 16 mm postdilated to 2.8 mm) covering the proximal portion of the previous stent   DOBUTAMINE STRESS ECHO  03/07/2015   [facility] (ordered for pre-op evaluation for EUS/ERCP --> abnormal EKG:  strreesss test shhoowwed 1 mm ST segment depressions downsloping. No wall motion abnormalities at peak exercise or at rest. Diastolic dysfunction was noted but normal systolic function - EF greater than 55%. No bouts of regurgitation or stenosis. Resting hypertension with exaggerated response   ESOPHAGOGASTRODUODENOSCOPY  04/08/2012   ESOPHAGOGASTRODUODENOSCOPY (EGD) WITH ESOPHAGEAL DILATION  X 2   FRACTURE SURGERY     HAMMER TOE SURGERY Bilateral    "took bone off the top of 2nd toe on each foot"   HIP SURGERY Left    "something to do w/my back"   JOINT REPLACEMENT     KNEE SURGERY Left    "had fluid drained off it a couple times"   LEFT HEART CATH AND CORONARY ANGIOGRAPHY  07/30/2010   severe LAD-diagonal 80%, moderate to severe proximal RCA.  Mean PAP 15 mmHg.  PCWP mean 17 mmHg.  RVP 45/11 mmHg.  PAP 47/24 mmHg, mean 31 mmHg.  LVEDP 27 mmHg   LEFT HEART CATHETERIZATION WITH CORONARY ANGIOGRAM N/A 02/03/2012   Procedure: LEFT HEART CATHETERIZATION WITH CORONARY ANGIOGRAM;  Surgeon: DaLeonie ManMD;  [facility]; widely patent LAD and RCA stents.  Patent D2 ostium.  Moderate L Renal A stenosis (56%), R Renal A 40-50% t.  LVEDP 20 mmHg.   LEFT HEART CATHETERIZATION WITH CORONARY ANGIOGRAM N/A 05/12/2014   Procedure: LEFT HEART  CATHETERIZATION WITH CORONARY ANGIOGRAM;  Surgeon: JoLorretta HarpMD;  [facility];  Service: Cardiovascular: Stable CAD. Patent stents. Patent renal artery stent   LEFT HEART CATHETERIZATION WITH CORONARY ANGIOGRAM  08/2012   Peri-Op MI @ [facility] -- 40% ostial D1, patent LAD stents, 10% RCA ISR   LEFT HEART CATHETERIZATION WITH CORONARY ANGIOGRAM   03/18/2011   70% ISR of LAD stent just after D2 (D2 has ostial 80 to  90% stenosis.  20 to 30% ISR RCA.  EDP elevated at 28 mmHg   NM MYOVIEW LTD  12/2016   LOW RISK study. No ischemia or infarction. EF 65-75%.   NM MYOVIEW LTD  01/05/2020    LOW RISK. EF 60-65%.  No EKG changes.  No infarct, no ischemia.   PACEMAKER IMPLANT N/A 07/09/2021   Procedure: PACEMAKER IMPLANT;  Surgeon: Sanda Klein, MD;  Location: [place] [facility];  Service: Cardiovascular;  Laterality: N/A;   PANCREAS SURGERY     "stent in my pancreas"; Dr Pershing Proud   PERCUTANEOUS PINNING PHALANX FRACTURE OF HAND Left ~ 2013   PERIPHERAL VASCULAR BALLOON ANGIOPLASTY  09/09/2017   Procedure: PERIPHERAL VASCULAR BALLOON ANGIOPLASTY;  Surgeon: Serafina Mitchell, MD;  Location: [place] [facility];  Service: Cardiovascular;;  Celiac instent   PERIPHERAL VASCULAR INTERVENTION  01/14/2017   Procedure: Peripheral Vascular Intervention;  Surgeon: Serafina Mitchell, MD;  Location: [place] [facility];  Service: Cardiovascular;;  mesentric   RENAL ARTERY STENT Left 08/2012   @ [facility] - 6.0 mm x 15 mm   SPINE SURGERY     tumor removed 07/2010; Redo Surgery 04/2012; Sacroiliac Sgx 08/2013   TOTAL ABDOMINAL HYSTERECTOMY     TOTAL KNEE ARTHROPLASTY Left ~ 2012   TRANSTHORACIC ECHOCARDIOGRAM  07/10/2021   (Postop PPM): Normal LV size and function.  EF 60 to 65%.  No RWMA.  GR 1 DD.  Mild LA dilation.  Normal RV size and function.  Normal RAP.  Normal aortic and mitral valves.  No pericardial effusion.   VISCERAL ANGIOGRAM N/A 05/12/2014   Procedure: VISCERAL  ANGIOGRAM;  Surgeon: Lorretta Harp, MD;  Location: [facility] CATH LAB;  25% ostial proximal celiac artery with downward takeoff.  23% proximal SMA and 56% proximal IMA.  Left renal artery stent widely patent.  Right renal artery is 20 to 30% proximal stenosis.   VISCERAL ANGIOGRAPHY N/A 01/14/2017   Procedure: Mesenteric  Angiography;  Surgeon: Serafina Mitchell, MD;  Location: [place] [facility];  Service: Cardiovascular;  Laterality: N/A;   VISCERAL ANGIOGRAPHY N/A 09/09/2017   Procedure: VISCERAL ANGIOGRAPHY;  Surgeon: Serafina Mitchell, MD;  Location: [place] [facility];  Service: Cardiovascular;  Laterality: N/A;   Patient Active Problem List   Diagnosis Date Noted   Osteoporosis 01/22/2022   B12 deficiency 01/10/2022   Allergic rhinitis 01/10/2022   Pneumothorax 07/10/2021   Pacemaker 07/09/2021   Syncope and collapse 06/20/2021   Mobitz type 2 second degree atrioventricular block 06/20/2021   PAT (paroxysmal atrial tachycardia) ([place]) 06/20/2021   PAF (paroxysmal atrial fibrillation) ([place]) 10/16/2020   Hallux valgus (acquired), right foot 08/17/2018   Costochondritis 01/21/2017   PAD (peripheral artery disease) ([place]) 01/14/2017   Continuous severe abdominal pain 10/11/2016   Type 2 diabetes mellitus with peripheral neuropathy ([place]) 10/09/2016   Onychomycosis due to dermatophyte 11/16/2015   Exertional dyspnea 03/16/2015   Abnormal electrocardiogram during exercise stress test 03/15/2015   Mesenteric artery stenosis ([place]) 04/20/2014   Idiopathic chronic pancreatitis suspected 03/10/2014   Abnormal CT of liver-possible cirrhosis 03/10/2014   Dysphagia 03/10/2014   Long term current use of antithrombotics/antiplatelets - clopidogrel 03/10/2014   Carotid stenosis 02/27/2014   Abdominal pain, epigastric 01/19/2014   Leg cramps 01/19/2014   Palpitations 01/12/2014   Stasis edema of bilateral lower extremity    Congestive heart failure with LV diastolic dysfunction, NYHA class 2 ([place])     Essential hypertension  Hyperlipidemia associated with type 2 diabetes mellitus ([place])    Celiac artery stenosis: 60% by Duplex - 90-95% by cath - Med Rx 11/27/2013   Renal artery stenosis (HCC) 08/29/2012   Myalgia due to statin 12/29/2011   CAD S/P percutaneous coronary angioplasty: pRCA BMS, mLAD BMS overlapped prox with DES for ISR 07/30/2010   GERD (gastroesophageal reflux disease) 01/24/2009   Diarrhea due to malabsorption 01/24/2009   Hypothyroid 11/12/2007   Irritable bowel syndrome 11/12/2007   Renal calculus 11/12/2007   PANCREAS DIVISUM 11/12/2007   HIATAL HERNIA 11/20/2005    ONSET DATE: 03/04/22  REFERRING DIAG:  Diagnosis  H81.10 (ICD-10-CM) - Benign paroxysmal positional vertigo, unspecified laterality    THERAPY DIAG:  Dizziness and giddiness  Difficulty in walking, not elsewhere classified  Other lack of coordination  Abnormal posture  Unsteadiness on feet  Rationale for Evaluation and Treatment Rehabilitation  SUBJECTIVE:   SUBJECTIVE STATEMENT: Patient reports her dizziness is up to 7 upon arrival. It has been high all morning. She was at the [facility] office this morning. He feels she is on too many BP meds and will re-assess them. She has an eye appointment scheduled, but couldn't get it until Oct 11th. Pt accompanied by: self  PERTINENT HISTORY: S: Noted vertigo when she got up on Sunday when was getting out of bed. Had room spinning sensation and felt ill on her stomach. Fell back into bed at least twice. She was having to hold on walls to walk. Also has had lower appetite and has felt somewhat unbalanced. Everytime she stands up gets spinning sensation. Does not get similar when she is in bed- doesn't roll much though ROS- No facial or extremity weakness. No slurred words or trouble swallowing. no blurry vision or double vision. No  new paresthesias- known neuropathy. No confusion or word finding difficulties. No new  hearing loss reported. No tinnitus  above baseline reported. No chest pain or shortness of breath above baseline A/P: Patient with positional vertigo that resolved with sitting or holding head still.  She is not sure if she can tolerate Dix-Hallpike maneuver and this was not performed-she did have some nystagmus with certain head movements including standing up quickly - Suspect BPPV.  Reassuring neurological exam - Sent in meclizine but discussed potential risks - Refer to vestibular rehab - Discussed if new or worsening symptoms certainly seek care but I do not strongly suspect mass or stroke which was her main concern     PAIN:  Are you having pain? Yes: NPRS scale: 5/10 Pain location: R foot Pain description: has gout Aggravating factors: standing, WB Relieving factors: medication  PRECAUTIONS: None  WEIGHT BEARING RESTRICTIONS No  FALLS: Has patient fallen in last 6 months? Yes. Number of falls a couple per week.  LIVING ENVIRONMENT: Lives with: lives with their spouse Lives in: House/apartment Stairs: Yes: External: 3 steps; on left going up Has following equipment at home: Single point cane  PLOF: Independent  PATIENT GOALS: Decrease dizziness and falls.  OBJECTIVE:   DIAGNOSTIC FINDINGS: UTI assessment neg,   COGNITION: Overall cognitive status: Within functional limits for tasks assessed   SENSATION: Not tested Patient reports B PN  MUSCLE TONE: WNL   POSTURE: rounded shoulders, forward head, decreased lumbar lordosis, increased thoracic kyphosis, and posterior pelvic tilt   Cervical ROM:    Active A/PROM (deg) eval  Flexion 80%  Extension 10  Right lateral flexion 50  Left lateral flexion 50  Right rotation 60  Left  rotation 60  (Blank rows = not tested)  STRENGTH: BLE strength 4/5 BLE  GAIT: Gait pattern: step through pattern, decreased arm swing- Right, decreased arm swing- Left, decreased step length- Right, decreased step length- Left, and shuffling Distance walked:  80 Assistive device utilized: None Level of assistance: Complete Independence Comments: Fearful and cautious  FUNCTIONAL TESTs:  Functional gait assessment: TBA   VESTIBULAR ASSESSMENT   GENERAL OBSERVATION: Patient walked in with no issues. No observable dizziness during history or reporting symptoms.    SYMPTOM BEHAVIOR:   Subjective history: Started after having Covid 2 years ago, seems to be progressing, causing regular falls.   Non-Vestibular symptoms:  none   Type of dizziness: Unsteady with head/body turns, "World moves", and "Swimmyheaded"   Frequency: Frequently in the morning   Duration: minutes   Aggravating factors: Induced by position change: supine to sit, Induced by motion: bending down to the ground, and Worse in the morning   Relieving factors: head stationary   Progression of symptoms: worse   OCULOMOTOR EXAM:   Ocular Alignment: normal   Ocular ROM: No Limitations and Caused 3-4 dizziness, resolved in < 1 minute   Spontaneous Nystagmus: right beating   Gaze-Induced Nystagmus: right beating with right gaze   Smooth Pursuits: intact   Saccades: intact and slow   Convergence/Divergence: 20 cm     VESTIBULAR - OCULAR REFLEX:    Slow VOR: Normal   VOR Cancellation: Unable to Maintain Gaze   Head-Impulse Test: HIT Right: negative HIT Left: negative   Dynamic Visual Acuity: Not able to be assessed    POSITIONAL TESTING: Right Dix-Hallpike: no nystagmus Left Dix-Hallpike: no nystagmus Right Roll Test: TBA Left Roll Test: TBA Right Sidelying: TBA Left Sidelying: TBA Deep Head Hang: TBA   OTHOSTATICS: TBA  FUNCTIONAL GAIT: Functional gait assessment: TBA  PATIENT EDUCATION: Education details: POC Person educated: Patient Education method: Explanation Education comprehension: verbalized understanding  Today's Treatment 04/29/22 BP upon arrival-147/76,  HR 83 Visual Tracking-R eye nystagmus noted throughout, but able to follow through all visual  fields. Doziness started at 7, no change. VOR-horizontal- abe to complete 7 reps, but increased dizziness to 10, resolved in 2 minutes VOR- vertical- 8 reps, dizziness to 10, resolved in 2 minutes. Static standing, narrow BOS, Stride stance with R and then L leg in front-30 sec each, CGA with increased sway, VC to look up.    04/23/22  BP upon arrival-163/83, HR 74. She rested x 5 minutes and it dropped to 145/80, HR 73.  Resting nystagmus noted, R beating. Dizziness reported at 7/10, rose immediately upon attempting saccades, did not drop, so further treatment deferred.  04/15/22 Orthostatic BPs Supine-176/66 Sit-Immediate 176/72 with mild dizziness 4/10 improved with time, 3 minutes-170/70 Stand-Immediate 128/66 with 7/10 dizziness that did drop to 3/10, 3 minutes 180/68   GOALS: Goals reviewed with patient? Yes  SHORT TERM GOALS: Target date: 04/29/2022   I with basic HEP Baseline: Goal status: ongoing  LONG TERM GOALS: Target date: 06/10/2022    I with final HEP Baseline:  Goal status: INITIAL  2.  Patient will report dizziness of <3/10 first thing in the morning. Baseline: Fluctuating dizziness, worse in the morning when first getting out of bed and bending forward. Goal status: ongoing  3.  Patient will score at least 24/30 on FGA Baseline: TBA Goal status: INITIAL  4.  Patient will report dizziness < 3/10 with bending forward. Baseline: Up to 8/10 Goal status: INITIAL  5.  Patient  will report no falls or near falls during functional mobility x 2 weeks in a row. Baseline: Multiple near falls. Goal status: INITIAL  ASSESSMENT:  CLINICAL IMPRESSION: Patient arrived feeling dizzy. 7/10. BP with 147/76. Dr Yong Channel responded to questions about BP saying he is afraid to adjust meds any more for fear she will start having OBP. As long as BP does not go about the 160-170's, and returns quickly, go ahead and treat aas her dizziness is concerning. Treatment did address  dizziness with multiple exercises. Dizziness would increase, but drop back to down to baseline. Established HEP for her to perform the exercises more frequently and see if this helps her habituate.   OBJECTIVE IMPAIRMENTS Abnormal gait, decreased activity tolerance, decreased balance, decreased coordination, difficulty walking, dizziness, and postural dysfunction.   ACTIVITY LIMITATIONS carrying, lifting, bending, squatting, and locomotion level  PARTICIPATION LIMITATIONS: meal prep, cleaning, laundry, driving, and shopping  PERSONAL FACTORS Age and Past/current experiences are also affecting patient's functional outcome.   REHAB POTENTIAL: Good  CLINICAL DECISION MAKING: Evolving/moderate complexity  EVALUATION COMPLEXITY: Moderate   PLAN: PT FREQUENCY: 1-2x/week  PT DURATION: 10 weeks  PLANNED INTERVENTIONS: Therapeutic exercises, Therapeutic activity, Neuromuscular re-education, Balance training, Gait training, Patient/Family education, Self Care, Joint mobilization, Vestibular training, Canalith repositioning, and Visual/preceptual remediation/compensation  PLAN FOR NEXT SESSION: See how her BP is doing, initiate HEP for eye movement. Assess horizontal and ant canals   Marcelina Morel, DPT 04/29/2022, 2:47 PM

## 2022-04-30 ENCOUNTER — Other Ambulatory Visit: Payer: Self-pay

## 2022-04-30 ENCOUNTER — Other Ambulatory Visit: Payer: Self-pay | Admitting: Family Medicine

## 2022-04-30 LAB — COMPREHENSIVE METABOLIC PANEL
ALT: 10 IU/L (ref 0–32)
AST: 8 IU/L (ref 0–40)
Albumin/Globulin Ratio: 1.4 (ref 1.2–2.2)
Albumin: 4.1 g/dL (ref 3.8–4.8)
Alkaline Phosphatase: 96 IU/L (ref 44–121)
BUN/Creatinine Ratio: 28 (ref 12–28)
BUN: 30 mg/dL — ABNORMAL HIGH (ref 8–27)
Bilirubin Total: 0.5 mg/dL (ref 0.0–1.2)
CO2: 21 mmol/L (ref 20–29)
Calcium: 9.6 mg/dL (ref 8.7–10.3)
Chloride: 96 mmol/L (ref 96–106)
Creatinine, Ser: 1.08 mg/dL — ABNORMAL HIGH (ref 0.57–1.00)
Globulin, Total: 3 g/dL (ref 1.5–4.5)
Glucose: 101 mg/dL — ABNORMAL HIGH (ref 70–99)
Potassium: 4.4 mmol/L (ref 3.5–5.2)
Sodium: 136 mmol/L (ref 134–144)
Total Protein: 7.1 g/dL (ref 6.0–8.5)
eGFR: 52 mL/min/{1.73_m2} — ABNORMAL LOW (ref >59)

## 2022-04-30 LAB — CBC
Hematocrit: 36.8 % (ref 34.0–46.6)
Hemoglobin: 12.3 g/dL (ref 11.1–15.9)
MCH: 28.9 pg (ref 26.6–33.0)
MCHC: 33.4 g/dL (ref 31.5–35.7)
MCV: 86 fL (ref 79–97)
Platelets: 280 10*3/uL (ref 150–450)
RBC: 4.26 x10E6/uL (ref 3.77–5.28)
RDW: 14 % (ref 11.7–15.4)
WBC: 7.8 10*3/uL (ref 3.4–10.8)

## 2022-04-30 LAB — URIC ACID: Uric Acid: 9.9 mg/dL — ABNORMAL HIGH (ref 3.1–7.9)

## 2022-04-30 LAB — TSH: TSH: 3.05 u[IU]/mL (ref 0.450–4.500)

## 2022-04-30 LAB — HEMOGLOBIN A1C
Est. average glucose Bld gHb Est-mCnc: 137 mg/dL
Hgb A1c MFr Bld: 6.4 % — ABNORMAL HIGH (ref 4.8–5.6)

## 2022-04-30 LAB — MAGNESIUM: Magnesium: 2.5 mg/dL — ABNORMAL HIGH (ref 1.6–2.3)

## 2022-04-30 MED ORDER — ALLOPURINOL 100 MG PO TABS: 100.0000 mg | ORAL_TABLET | Freq: Every day | ORAL | 6 refills | Status: DC

## 2022-05-05 NOTE — Progress Notes (Signed)
Remote pacemaker transmission.   

## 2022-05-07 ENCOUNTER — Ambulatory Visit: Payer: Medicare Other | Admitting: Physical Therapy

## 2022-05-20 ENCOUNTER — Telehealth: Payer: Self-pay | Admitting: Family Medicine

## 2022-05-20 NOTE — Telephone Encounter (Signed)
See below

## 2022-05-20 NOTE — Telephone Encounter (Signed)
If she thinks she can wait until Wednesday (no worsening pain, vomiting, fevers) can put her in at the 1 PM slot but still does have her come with her husband and I will do my best to see her at the time of husband's visit

## 2022-05-20 NOTE — Telephone Encounter (Signed)
I have moved appt to Wed at 1pm and have left Dr. Ansel Bong message on patients VM.

## 2022-05-20 NOTE — Telephone Encounter (Addendum)
Patient states she is having ongoing left side pain that has happened before, however, this time it is making Patient a little sick to her stomach. Patient declined Triage-will only see Dr. Yong Channel.  Please advise.  Patient was scheduled for the above on 05/30/22.  Patient states her husband [name] has a New Patient appointment with Dr. Yong Channel on 05/22/22 at 9 am.

## 2022-05-22 ENCOUNTER — Encounter: Payer: Self-pay | Admitting: Family Medicine

## 2022-05-22 ENCOUNTER — Ambulatory Visit (INDEPENDENT_AMBULATORY_CARE_PROVIDER_SITE_OTHER): Payer: Medicare Other | Admitting: Family Medicine

## 2022-05-22 VITALS — BP 130/76 | HR 83 | Temp 97.7°F | Ht 68.0 in | Wt 204.2 lb

## 2022-05-22 DIAGNOSIS — R0781 Pleurodynia: Secondary | ICD-10-CM

## 2022-05-22 DIAGNOSIS — I701 Atherosclerosis of renal artery: Secondary | ICD-10-CM

## 2022-05-22 DIAGNOSIS — R1012 Left upper quadrant pain: Secondary | ICD-10-CM | POA: Diagnosis not present

## 2022-05-22 DIAGNOSIS — K625 Hemorrhage of anus and rectum: Secondary | ICD-10-CM | POA: Diagnosis not present

## 2022-05-22 DIAGNOSIS — I10 Essential (primary) hypertension: Secondary | ICD-10-CM

## 2022-05-22 DIAGNOSIS — R0789 Other chest pain: Secondary | ICD-10-CM

## 2022-05-22 DIAGNOSIS — I480 Paroxysmal atrial fibrillation: Secondary | ICD-10-CM | POA: Diagnosis not present

## 2022-05-22 DIAGNOSIS — E1142 Type 2 diabetes mellitus with diabetic polyneuropathy: Secondary | ICD-10-CM

## 2022-05-22 DIAGNOSIS — Z79899 Other long term (current) drug therapy: Secondary | ICD-10-CM | POA: Diagnosis not present

## 2022-05-22 LAB — CBC WITH DIFFERENTIAL/PLATELET
Basophils Absolute: 0.1 10*3/uL (ref 0.0–0.1)
Basophils Relative: 2 % (ref 0.0–3.0)
Eosinophils Absolute: 0.2 10*3/uL (ref 0.0–0.7)
Eosinophils Relative: 3.8 % (ref 0.0–5.0)
HCT: 34.9 % — ABNORMAL LOW (ref 36.0–46.0)
Hemoglobin: 11.8 g/dL — ABNORMAL LOW (ref 12.0–15.0)
Lymphocytes Relative: 22.3 % (ref 12.0–46.0)
Lymphs Abs: 1.5 10*3/uL (ref 0.7–4.0)
MCHC: 33.8 g/dL (ref 30.0–36.0)
MCV: 86.9 fl (ref 78.0–100.0)
Monocytes Absolute: 0.7 10*3/uL (ref 0.1–1.0)
Monocytes Relative: 10.7 % (ref 3.0–12.0)
Neutro Abs: 4 10*3/uL (ref 1.4–7.7)
Neutrophils Relative %: 61.2 % (ref 43.0–77.0)
Platelets: 226 10*3/uL (ref 150.0–400.0)
RBC: 4.02 Mil/uL (ref 3.87–5.11)
RDW: 15.3 % (ref 11.5–15.5)
WBC: 6.6 10*3/uL (ref 4.0–10.5)

## 2022-05-22 LAB — COMPREHENSIVE METABOLIC PANEL
ALT: 7 U/L (ref 0–35)
AST: 10 U/L (ref 0–37)
Albumin: 3.8 g/dL (ref 3.5–5.2)
Alkaline Phosphatase: 66 U/L (ref 39–117)
BUN: 37 mg/dL — ABNORMAL HIGH (ref 6–23)
CO2: 30 mEq/L (ref 19–32)
Calcium: 9.4 mg/dL (ref 8.4–10.5)
Chloride: 98 mEq/L (ref 96–112)
Creatinine, Ser: 1.28 mg/dL — ABNORMAL HIGH (ref 0.40–1.20)
GFR: 39.67 mL/min — ABNORMAL LOW (ref >60.00)
Glucose, Bld: 107 mg/dL — ABNORMAL HIGH (ref 70–99)
Potassium: 4.3 mEq/L (ref 3.5–5.1)
Sodium: 136 mEq/L (ref 135–145)
Total Bilirubin: 0.5 mg/dL (ref 0.2–1.2)
Total Protein: 7.1 g/dL (ref 6.0–8.3)

## 2022-05-22 LAB — LIPASE: Lipase: 29 U/L (ref 11.0–59.0)

## 2022-05-22 LAB — MICROALBUMIN / CREATININE URINE RATIO
Creatinine,U: 37.4 mg/dL
Microalb Creat Ratio: 6.6 mg/g (ref 0.0–30.0)
Microalb, Ur: 2.5 mg/dL — ABNORMAL HIGH (ref 0.0–1.9)

## 2022-05-22 LAB — AMYLASE: Amylase: 29 U/L (ref 27–131)

## 2022-05-22 NOTE — Progress Notes (Signed)
Phone [phone] In person visit   Subjective:   [name] is a 81 y.o. year old very pleasant female patient who presents for/with See problem oriented charting Chief Complaint  Patient presents with   Abdominal Pain    Pt c/o ongoing left sided abd pain that is getting worse.   Past Medical History-  Patient Active Problem List   Diagnosis Date Noted   Osteoporosis 01/22/2022    Priority: High   Pacemaker 07/09/2021    Priority: High   Mobitz type 2 second degree atrioventricular block 06/20/2021    Priority: High   PAT (paroxysmal atrial tachycardia) ([place]) 06/20/2021    Priority: High   PAF (paroxysmal atrial fibrillation) ([place]) 10/16/2020    Priority: High   PAD (peripheral artery disease) ([place]) 01/14/2017    Priority: High   Type 2 diabetes mellitus with peripheral neuropathy ([place]) 10/09/2016    Priority: High   Mesenteric artery stenosis (HCC) 04/20/2014    Priority: High   Abdominal pain, epigastric 01/19/2014    Priority: High   Congestive heart failure with LV diastolic dysfunction, NYHA class 2 ([place])     Priority: High   Celiac artery stenosis: 60% by Duplex - 90-95% by cath - Med Rx 11/27/2013    Priority: High   Renal artery stenosis ([place]) 08/29/2012    Priority: High   Coronary artery disease involving native coronary artery of native heart with angina pectoris ([place]) 10/30/2011    Priority: High   CAD S/P percutaneous coronary angioplasty: pRCA BMS, mLAD BMS overlapped prox with DES for ISR 07/30/2010    Priority: High   B12 deficiency 01/10/2022    Priority: Medium    Continuous severe abdominal pain 10/11/2016    Priority: Medium    Exertional dyspnea 03/16/2015    Priority: Medium    Idiopathic chronic pancreatitis suspected 03/10/2014    Priority: Medium    Abnormal CT of liver-possible cirrhosis 03/10/2014    Priority: Medium    Stasis edema of bilateral lower extremity     Priority: Medium    Essential hypertension     Priority:  Medium    Hyperlipidemia associated with type 2 diabetes mellitus ([place])     Priority: Medium    Myalgia due to statin 12/29/2011    Priority: Medium    GERD (gastroesophageal reflux disease) 01/24/2009    Priority: Medium    Hypothyroid 11/12/2007    Priority: Medium    Irritable bowel syndrome 11/12/2007    Priority: Medium    PANCREAS DIVISUM 11/12/2007    Priority: Medium    Allergic rhinitis 01/10/2022    Priority: Low   Pneumothorax 07/10/2021    Priority: Low   Syncope and collapse 06/20/2021    Priority: Low   Hallux valgus (acquired), right foot 08/17/2018    Priority: Low   Costochondritis 01/21/2017    Priority: Low   Onychomycosis due to dermatophyte 11/16/2015    Priority: Low   Abnormal electrocardiogram during exercise stress test 03/15/2015    Priority: Low   Dysphagia 03/10/2014    Priority: Low   Long term current use of antithrombotics/antiplatelets - clopidogrel 03/10/2014    Priority: Low   Carotid stenosis 02/27/2014    Priority: Low   Leg cramps 01/19/2014    Priority: Low   Palpitations 01/12/2014    Priority: Low   Diarrhea due to malabsorption 01/24/2009    Priority: Low   Renal calculus 11/12/2007    Priority: Low   HIATAL HERNIA  11/20/2005    Priority: Low    Medications- reviewed and updated Current Outpatient Medications  Medication Sig Dispense Refill   acetaminophen (TYLENOL) 500 MG tablet Take 1,000-1,500 mg by mouth every 6 (six) hours as needed for moderate pain.     alendronate (FOSAMAX) 70 MG tablet Take 1 tablet (70 mg total) by mouth every 7 (seven) days. Take with a full glass of water on an empty stomach. 5 tablet 11   allopurinol (ZYLOPRIM) 100 MG tablet Take 1 tablet (100 mg total) by mouth daily. 30 tablet 6   amiodarone (PACERONE) 200 MG tablet Take 400 mg  (2 tablets) one dose as need for a fast heart rate ,if after 12 hours still present take 200 mg ( 1 tablet)  one dose  for each episode 30 tablet 6   apixaban  (ELIQUIS) 5 MG TABS tablet Take 1 tablet (5 mg total) by mouth 2 (two) times daily. 180 tablet 2   Artificial Tear Solution (TEARS RENEWED OP) Apply 1 drop to eye daily as needed (dry eyes).     carvedilol (COREG) 6.25 MG tablet TAKE 1 TABLET BY MOUTH TWICE DAILY WITH A MEAL. MAY TAKE AN EXTRA TABLET DAILY IF HAVING PALPATIONS (Patient taking differently: Take 6.25 mg by mouth 2 (two) times daily with a meal.) 225 tablet 3   colchicine 0.6 MG tablet Take by mouth.     cyanocobalamin (,VITAMIN B-12,) 1000 MCG/ML injection 1000 mcg injection once per month. Please provide syringes as well 1 mL 15   diclofenac sodium (VOLTAREN) 1 % GEL Apply 2 g topically 4 (four) times daily as needed (pain).     furosemide (LASIX) 80 MG tablet TAKE 1 TABLET(80 MG) by mouth once a day , may take an additional 80 mg if needed for swelling (Patient taking differently: 80 mg. TAKE 1 TABLET(80 MG) by mouth twice a day , may take an additional 80 mg if needed for swelling) 180 tablet 3   hyoscyamine (ANASPAZ) 0.125 MG TBDP disintergrating tablet Place 1 tablet (0.125 mg total) under the tongue every 6 (six) hours as needed for cramping. 30 tablet 5   indapamide (LOZOL) 2.5 MG tablet TAKE ONE TABLET BY MOUTH DAILY, 30 MINUTES BEFORE DAILY FUROSEMIDE. 90 tablet 2   isosorbide mononitrate (IMDUR) 30 MG 24 hr tablet TAKE 1 TABLET BY MOUTH EVERY DAY 90 tablet 3   lidocaine (XYLOCAINE) 5 % ointment Apply 1 application. topically as needed. 35.44 g 0   losartan (COZAAR) 50 MG tablet Take 1 tablet (50 mg total) by mouth daily. 90 tablet 3   meclizine (ANTIVERT) 12.5 MG tablet Take 1 tablet (12.5 mg total) by mouth 3 (three) times daily as needed for dizziness. 30 tablet 1   nitroGLYCERIN (NITROSTAT) 0.4 MG SL tablet DISSOLVE 1 TABLET UNDER THE TONGUE EVERY 5 MINUTES AS NEEDED FOR CHEST PAIN 25 tablet 4   spironolactone (ALDACTONE) 25 MG tablet TAKE 1 TABLET BY MOUTH AS DIRECTED. TAKE 1 TABLET ON MONDAYS, WEDNESDAYS, AND FRIDAYS AT 6  PM. 45 tablet 3   No current facility-administered medications for this visit.     Objective:  BP 130/76   Pulse 83   Temp 97.7 F (36.5 C)   Ht '5\' 8"'$  (1.727 m)   Wt 204 lb 3.2 oz (92.6 kg)   SpO2 95%   BMI 31.05 kg/m  Gen: NAD, appears to be uncomfortable at times CV: RRR no murmurs rubs or gallops Lungs: CTAB no crackles, wheeze, rhonchi Has pain in  a bandlike distribution along left lower rib-no midline thoracic back pain Abdomen: soft/nontender except for left upper quadrant just under the rib moderate pain/nondistended/normal bowel sounds. No rebound or guarding.  Ext: Trace to 1+ edema Skin: warm, dry     Assessment and Plan   # Left rib pain/left upper quadrant pain S:started 2 weeks ago. Has had similar issues come and go in the past and sent to [facility] in the past several times- concern for pancreas - was placed on pancreatic enzyme replacement in the past.  Usually happens just for a few days once every 3 months. Last episode in easter  This episode is not improving. Sitting in chair with pillow behind her helps abut has to be very still or lay on her back. Any movement bothers it/ no rash. In a band across lower left rib from near spine but around to the back.   Takes tylenol '1000mg'$   along with 2 advil every 2-3 hours- maximum tylenol we discussed is '3000mg'$  a day. Lidocaine patch and cream helps some.  -voltaren gel doesn't help much  Has had vomiting- has noted blood with this once or twice in the last 2 weeks and has been darker at times A/P: Patient with chronic intermittent issues with epic gastric/left upper quadrant/left rib pain reported to me in the past.  Does have history of pancreas divisum with stents placed at [facility] over 20 years ago.  Pain is not related to meals.  Pain is positional.  Still improves with laying completely still.  Already has gallbladder and appendix removed.  Already has total abdominal hysterectomy.  Not thought to be cardiac by cardiology  in the past. -Not thought to be pulmonary embolism-ct angio 11/27/21 for right sided chest pain in march with no PE- - was having left sided pai as well at that time - no recent thoracic spine films- question radicular pain from this?  Has had back surgery in the past -Check lipase and amylase-May need to repeat abdomen films -Has had blood in stool-check stool cards and if persistent may need GI consult-also reports some darker stools/possible melena-May need both colonoscopy and EGD  For pain management -Needs to stop Tylenol and ibuprofen for now-need to evaluate kidney and acetaminophen levels-concern for overuse -didn't tolerate gabapentin -anaphylaxis on codeine-therefore not a candidate for tramadol, oxycodone -Continue lidocaine.  I really do not have other great options available -Consider sports medicine referral but want to start with above work-up first -Prior hyoscyamine optically helpful  #hypertension S: medication: Carvedilol 6.25 mg twice daily, Lasix 80 mg once a day for the most part-has additional tablet if needed, losartan 50 mg, spironolactone 25 mg, Imdur 30 mg, indapamide 2.5 mg BP Readings from Last 3 Encounters:  05/22/22 130/76  04/29/22 126/60  04/15/22 132/68  A/P:  Controlled. Continue current medications.     # Atrial fibrillation S: Rate controlled with carvedilol as above Anticoagulated with eliquis 5 mg twice daily A/P: Appropriately anticoagulated and rate controlled-continue current medication -May need to hold Eliquis if recurrent blood in the stool issues or if worsening anemia  Recommended follow up: No follow-ups on file. Future Appointments  Date Time Provider [place]  05/22/2022  1:00 PM Marin Olp, MD [facility]  07/12/2022  7:00 AM [facility] DEVICE REMOTES [facility]  07/22/2022 10:20 AM Marin Olp, MD [facility]  10/11/2022  7:00 AM [facility] DEVICE REMOTES [facility]  03/10/2023  10:30 AM [facility] HEALTH COACH [facility]  Lab/Order associations:   ICD-10-CM   1. LUQ pain  R10.12 CBC with Differential/Platelet    Comprehensive metabolic panel    Lipase    Amylase    2. Rib pain on left side  R07.81 CBC with Differential/Platelet    Comprehensive metabolic panel    DG Ribs Unilateral W/Chest Left    DG Thoracic Spine W/Swimmers    3. Type 2 diabetes mellitus with peripheral neuropathy (HCC)  E11.42 Microalbumin / creatinine urine ratio    4. High risk medication use  Z79.899 Acetaminophen level    5. Bright red blood per rectum  K62.5 Fecal occult blood, imunochemical    6. Essential hypertension  I10     7. PAF (paroxysmal atrial fibrillation) (HCC)  I48.0       Garret Reddish, MD

## 2022-05-22 NOTE — Patient Instructions (Addendum)
Have diabetic eye exam faxed to [place] at [phone] next month.  - maximum tylenol we discussed is '3000mg'$  a day. Ideally stop tylenol for now- until we get tylenol levels back  -ibuprofen/advil would limit to every 6 hours 400 mg especially with heart history (ideally not take at all)   [facility] contact Please call to schedule visit and/or procedure Address: [address], [place], [place] [address] Phone: [phone]   Please stop by lab before you go  AND PICK UP STOOL CARDS If you have mychart- we will send your results within 3 business days of [place] receiving them.  If you do not have mychart- we will call you about results within 5 business days of [place] receiving them.  *please also note that you will see labs on mychart as soon as they post. I will later go in and write notes on them- will say "notes from Dr. Yong Channel"   Please go to [facility] (updated 10/28/2019) - located [address] across [address] street from [place] - in the basement - Hours: 8:30-5:00 PM M-F (with lunch from 12:30- 1 PM). You do NOT need an appointment.

## 2022-05-24 ENCOUNTER — Ambulatory Visit (INDEPENDENT_AMBULATORY_CARE_PROVIDER_SITE_OTHER)
Admission: RE | Admit: 2022-05-24 | Discharge: 2022-05-24 | Disposition: A | Discharge status: Home / Self Care | Payer: Medicare Other | Source: Ambulatory Visit | Attending: Family Medicine | Admitting: Family Medicine

## 2022-05-24 DIAGNOSIS — M810 Age-related osteoporosis without current pathological fracture: Secondary | ICD-10-CM | POA: Diagnosis not present

## 2022-05-24 DIAGNOSIS — R0781 Pleurodynia: Secondary | ICD-10-CM | POA: Diagnosis not present

## 2022-05-24 DIAGNOSIS — M47814 Spondylosis without myelopathy or radiculopathy, thoracic region: Secondary | ICD-10-CM | POA: Diagnosis not present

## 2022-05-24 DIAGNOSIS — R0789 Other chest pain: Secondary | ICD-10-CM | POA: Diagnosis not present

## 2022-05-24 DIAGNOSIS — Z950 Presence of cardiac pacemaker: Secondary | ICD-10-CM | POA: Diagnosis not present

## 2022-05-24 DIAGNOSIS — M898X8 Other specified disorders of bone, other site: Secondary | ICD-10-CM | POA: Diagnosis not present

## 2022-05-24 DIAGNOSIS — M2578 Osteophyte, vertebrae: Secondary | ICD-10-CM | POA: Diagnosis not present

## 2022-05-24 DIAGNOSIS — Z955 Presence of coronary angioplasty implant and graft: Secondary | ICD-10-CM | POA: Diagnosis not present

## 2022-05-26 LAB — ACETAMINOPHEN LEVEL: Acetaminophen (Tylenol), Serum: <10 mg/L — ABNORMAL LOW (ref 10–20)

## 2022-05-27 ENCOUNTER — Other Ambulatory Visit: Payer: Self-pay | Admitting: Family Medicine

## 2022-05-27 DIAGNOSIS — R1012 Left upper quadrant pain: Secondary | ICD-10-CM

## 2022-05-27 DIAGNOSIS — R0781 Pleurodynia: Secondary | ICD-10-CM

## 2022-05-28 ENCOUNTER — Telehealth: Payer: Self-pay | Admitting: Cardiology

## 2022-05-28 NOTE — Telephone Encounter (Signed)
Patient would like a call back from [employer].

## 2022-05-28 NOTE — Telephone Encounter (Signed)
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

## 2022-05-29 ENCOUNTER — Telehealth: Payer: Self-pay | Admitting: Family Medicine

## 2022-05-29 NOTE — Telephone Encounter (Signed)
Patient states: -She missed call from PCP on 09/25 but found out it was in regards to imaging ordered  I informed patient imaging was ordered and set to be completed at [place]. Pt verbalized understanding.   Patient requests: -Clarification on if she will receive scan of abdomen (pancreas area) within the scan ordered.   Please Advise.

## 2022-05-29 NOTE — Telephone Encounter (Signed)
Called and confirmed below with pt.

## 2022-05-30 ENCOUNTER — Ambulatory Visit: Payer: Medicare Other | Admitting: Family Medicine

## 2022-05-30 NOTE — Telephone Encounter (Signed)
Spoke to patient.  Reassured patient  schedule appointment for Jan 2024 . Is the time frame Dr Ellyn Hack want her to come back.   Patient also wanted to left Dr Ellyn Hack know that Dr Yong Channel  place medication on hold 05/22/22-  Indapamide and Losartan  ( due to abnormal labs 05/22/22)

## 2022-06-03 ENCOUNTER — Encounter (HOSPITAL_BASED_OUTPATIENT_CLINIC_OR_DEPARTMENT_OTHER): Payer: Self-pay

## 2022-06-03 ENCOUNTER — Other Ambulatory Visit (HOSPITAL_BASED_OUTPATIENT_CLINIC_OR_DEPARTMENT_OTHER): Payer: Self-pay

## 2022-06-03 ENCOUNTER — Other Ambulatory Visit: Payer: Self-pay | Admitting: Cardiology

## 2022-06-03 ENCOUNTER — Ambulatory Visit (HOSPITAL_BASED_OUTPATIENT_CLINIC_OR_DEPARTMENT_OTHER)
Admission: RE | Admit: 2022-06-03 | Discharge: 2022-06-03 | Disposition: A | Discharge status: Home / Self Care | Payer: Medicare Other | Source: Ambulatory Visit | Attending: Family Medicine | Admitting: Family Medicine

## 2022-06-03 DIAGNOSIS — R1012 Left upper quadrant pain: Secondary | ICD-10-CM | POA: Diagnosis not present

## 2022-06-03 DIAGNOSIS — N200 Calculus of kidney: Secondary | ICD-10-CM | POA: Diagnosis not present

## 2022-06-03 DIAGNOSIS — J984 Other disorders of lung: Secondary | ICD-10-CM | POA: Diagnosis not present

## 2022-06-03 DIAGNOSIS — K449 Diaphragmatic hernia without obstruction or gangrene: Secondary | ICD-10-CM | POA: Diagnosis not present

## 2022-06-03 DIAGNOSIS — I700 Atherosclerosis of aorta: Secondary | ICD-10-CM | POA: Diagnosis not present

## 2022-06-03 DIAGNOSIS — R0781 Pleurodynia: Secondary | ICD-10-CM | POA: Diagnosis not present

## 2022-06-03 DIAGNOSIS — D3502 Benign neoplasm of left adrenal gland: Secondary | ICD-10-CM | POA: Diagnosis not present

## 2022-06-03 DIAGNOSIS — Q438 Other specified congenital malformations of intestine: Secondary | ICD-10-CM | POA: Diagnosis not present

## 2022-06-03 MED ORDER — SHINGRIX 50 MCG/0.5ML IM SUSR
INTRAMUSCULAR | 1 refills | Status: DC
  Filled 2022-06-03: qty 1, 1d supply, fill #0
  Filled 2022-09-05: qty 1, 1d supply, fill #1

## 2022-06-03 MED ORDER — IOHEXOL 300 MG/ML  SOLN
100.0000 mL | Freq: Once | INTRAMUSCULAR | Status: AC | PRN
  Administered 2022-06-03: 80 mL via INTRAVENOUS

## 2022-06-05 ENCOUNTER — Other Ambulatory Visit: Payer: Self-pay

## 2022-06-05 DIAGNOSIS — E041 Nontoxic single thyroid nodule: Secondary | ICD-10-CM

## 2022-06-05 DIAGNOSIS — R935 Abnormal findings on diagnostic imaging of other abdominal regions, including retroperitoneum: Secondary | ICD-10-CM

## 2022-06-10 ENCOUNTER — Ambulatory Visit
Admission: RE | Admit: 2022-06-10 | Discharge: 2022-06-10 | Disposition: A | Discharge status: Home / Self Care | Payer: Medicare Other | Source: Ambulatory Visit | Attending: Family Medicine | Admitting: Family Medicine

## 2022-06-10 DIAGNOSIS — E041 Nontoxic single thyroid nodule: Secondary | ICD-10-CM | POA: Diagnosis not present

## 2022-06-12 DIAGNOSIS — H52203 Unspecified astigmatism, bilateral: Secondary | ICD-10-CM | POA: Diagnosis not present

## 2022-06-12 DIAGNOSIS — H5203 Hypermetropia, bilateral: Secondary | ICD-10-CM | POA: Diagnosis not present

## 2022-06-12 DIAGNOSIS — H26491 Other secondary cataract, right eye: Secondary | ICD-10-CM | POA: Diagnosis not present

## 2022-06-12 DIAGNOSIS — H43813 Vitreous degeneration, bilateral: Secondary | ICD-10-CM | POA: Diagnosis not present

## 2022-06-12 DIAGNOSIS — H04123 Dry eye syndrome of bilateral lacrimal glands: Secondary | ICD-10-CM | POA: Diagnosis not present

## 2022-06-12 DIAGNOSIS — Z961 Presence of intraocular lens: Secondary | ICD-10-CM | POA: Diagnosis not present

## 2022-06-12 DIAGNOSIS — Z7984 Long term (current) use of oral hypoglycemic drugs: Secondary | ICD-10-CM | POA: Diagnosis not present

## 2022-06-12 DIAGNOSIS — H524 Presbyopia: Secondary | ICD-10-CM | POA: Diagnosis not present

## 2022-06-12 DIAGNOSIS — E119 Type 2 diabetes mellitus without complications: Secondary | ICD-10-CM | POA: Diagnosis not present

## 2022-06-21 DIAGNOSIS — M1711 Unilateral primary osteoarthritis, right knee: Secondary | ICD-10-CM | POA: Diagnosis not present

## 2022-06-26 DIAGNOSIS — H26491 Other secondary cataract, right eye: Secondary | ICD-10-CM | POA: Diagnosis not present

## 2022-07-05 ENCOUNTER — Encounter: Payer: Self-pay | Admitting: *Deleted

## 2022-07-12 ENCOUNTER — Ambulatory Visit: Payer: Medicare Other | Admitting: Physician Assistant

## 2022-07-12 ENCOUNTER — Ambulatory Visit (INDEPENDENT_AMBULATORY_CARE_PROVIDER_SITE_OTHER): Payer: Medicare Other

## 2022-07-12 DIAGNOSIS — I441 Atrioventricular block, second degree: Secondary | ICD-10-CM | POA: Diagnosis not present

## 2022-07-14 LAB — CUP PACEART REMOTE DEVICE CHECK
Battery Remaining Longevity: 170 mo
Battery Voltage: 3.08 V
Brady Statistic AP VP Percent: 0.02 %
Brady Statistic AP VS Percent: 6.61 %
Brady Statistic AS VP Percent: 0.03 %
Brady Statistic AS VS Percent: 93.34 %
Brady Statistic RA Percent Paced: 6.62 %
Brady Statistic RV Percent Paced: 0.05 %
Date Time Interrogation Session: 20231110222920
Implantable Lead Connection Status: 753985
Implantable Lead Connection Status: 753985
Implantable Lead Implant Date: 20221107
Implantable Lead Implant Date: 20221107
Implantable Lead Location: 753859
Implantable Lead Location: 753860
Implantable Lead Model: 5076
Implantable Lead Model: 5076
Implantable Pulse Generator Implant Date: 20221107
Lead Channel Impedance Value: 380 Ohm
Lead Channel Impedance Value: 456 Ohm
Lead Channel Impedance Value: 494 Ohm
Lead Channel Impedance Value: 532 Ohm
Lead Channel Pacing Threshold Amplitude: 0.625 V
Lead Channel Pacing Threshold Amplitude: 0.625 V
Lead Channel Pacing Threshold Pulse Width: 0.4 ms
Lead Channel Pacing Threshold Pulse Width: 0.4 ms
Lead Channel Sensing Intrinsic Amplitude: 11.625 mV
Lead Channel Sensing Intrinsic Amplitude: 11.625 mV
Lead Channel Sensing Intrinsic Amplitude: 3.75 mV
Lead Channel Sensing Intrinsic Amplitude: 3.75 mV
Lead Channel Setting Pacing Amplitude: 1.5 V
Lead Channel Setting Pacing Amplitude: 2 V
Lead Channel Setting Pacing Pulse Width: 0.4 ms
Lead Channel Setting Sensing Sensitivity: 1.2 mV
Zone Setting Status: 755011
Zone Setting Status: 755011

## 2022-07-16 ENCOUNTER — Other Ambulatory Visit: Payer: Self-pay | Admitting: Family Medicine

## 2022-07-16 DIAGNOSIS — H8110 Benign paroxysmal vertigo, unspecified ear: Secondary | ICD-10-CM

## 2022-07-17 NOTE — Progress Notes (Signed)
Remote pacemaker transmission.   

## 2022-07-22 ENCOUNTER — Encounter: Payer: Self-pay | Admitting: Family Medicine

## 2022-07-22 ENCOUNTER — Ambulatory Visit (INDEPENDENT_AMBULATORY_CARE_PROVIDER_SITE_OTHER): Payer: Medicare Other | Admitting: Family Medicine

## 2022-07-22 VITALS — BP 132/64 | HR 86 | Temp 98.0°F | Ht 68.0 in | Wt 201.6 lb

## 2022-07-22 DIAGNOSIS — E039 Hypothyroidism, unspecified: Secondary | ICD-10-CM | POA: Diagnosis not present

## 2022-07-22 DIAGNOSIS — R1012 Left upper quadrant pain: Secondary | ICD-10-CM

## 2022-07-22 DIAGNOSIS — E1169 Type 2 diabetes mellitus with other specified complication: Secondary | ICD-10-CM | POA: Diagnosis not present

## 2022-07-22 DIAGNOSIS — E785 Hyperlipidemia, unspecified: Secondary | ICD-10-CM | POA: Diagnosis not present

## 2022-07-22 DIAGNOSIS — I10 Essential (primary) hypertension: Secondary | ICD-10-CM | POA: Diagnosis not present

## 2022-07-22 DIAGNOSIS — M1A0490 Idiopathic chronic gout, unspecified hand, without tophus (tophi): Secondary | ICD-10-CM | POA: Diagnosis not present

## 2022-07-22 DIAGNOSIS — I701 Atherosclerosis of renal artery: Secondary | ICD-10-CM

## 2022-07-22 DIAGNOSIS — M1711 Unilateral primary osteoarthritis, right knee: Secondary | ICD-10-CM | POA: Diagnosis not present

## 2022-07-22 DIAGNOSIS — E1142 Type 2 diabetes mellitus with diabetic polyneuropathy: Secondary | ICD-10-CM | POA: Diagnosis not present

## 2022-07-22 LAB — CBC WITH DIFFERENTIAL/PLATELET
Basophils Absolute: 0.1 10*3/uL (ref 0.0–0.1)
Basophils Relative: 1.1 % (ref 0.0–3.0)
Eosinophils Absolute: 0.3 10*3/uL (ref 0.0–0.7)
Eosinophils Relative: 3.9 % (ref 0.0–5.0)
HCT: 37 % (ref 36.0–46.0)
Hemoglobin: 12.7 g/dL (ref 12.0–15.0)
Lymphocytes Relative: 21.3 % (ref 12.0–46.0)
Lymphs Abs: 1.5 10*3/uL (ref 0.7–4.0)
MCHC: 34.2 g/dL (ref 30.0–36.0)
MCV: 88.9 fl (ref 78.0–100.0)
Monocytes Absolute: 0.6 10*3/uL (ref 0.1–1.0)
Monocytes Relative: 9.2 % (ref 3.0–12.0)
Neutro Abs: 4.5 10*3/uL (ref 1.4–7.7)
Neutrophils Relative %: 64.5 % (ref 43.0–77.0)
Platelets: 268 10*3/uL (ref 150.0–400.0)
RBC: 4.16 Mil/uL (ref 3.87–5.11)
RDW: 14.7 % (ref 11.5–15.5)
WBC: 7 10*3/uL (ref 4.0–10.5)

## 2022-07-22 LAB — COMPREHENSIVE METABOLIC PANEL
ALT: 8 U/L (ref 0–35)
AST: 11 U/L (ref 0–37)
Albumin: 4.1 g/dL (ref 3.5–5.2)
Alkaline Phosphatase: 73 U/L (ref 39–117)
BUN: 39 mg/dL — ABNORMAL HIGH (ref 6–23)
CO2: 29 mEq/L (ref 19–32)
Calcium: 9.4 mg/dL (ref 8.4–10.5)
Chloride: 100 mEq/L (ref 96–112)
Creatinine, Ser: 1.02 mg/dL (ref 0.40–1.20)
GFR: 52.03 mL/min — ABNORMAL LOW (ref >60.00)
Glucose, Bld: 114 mg/dL — ABNORMAL HIGH (ref 70–99)
Potassium: 4 mEq/L (ref 3.5–5.1)
Sodium: 137 mEq/L (ref 135–145)
Total Bilirubin: 0.4 mg/dL (ref 0.2–1.2)
Total Protein: 7.3 g/dL (ref 6.0–8.3)

## 2022-07-22 LAB — URIC ACID: Uric Acid, Serum: 7 mg/dL (ref 2.4–7.0)

## 2022-07-22 LAB — LIPASE: Lipase: 29 U/L (ref 11.0–59.0)

## 2022-07-22 MED ORDER — HYDROCODONE-ACETAMINOPHEN 5-325 MG PO TABS: 1.0000 | ORAL_TABLET | Freq: Four times a day (QID) | ORAL | 0 refills | Status: DC | PRN

## 2022-07-22 NOTE — Progress Notes (Signed)
Phone [phone] In person visit   Subjective:   [name] is a 81 y.o. year old very pleasant female patient who presents for/with See problem oriented charting Chief Complaint  Patient presents with   Follow-up    Pt c/o left sided back pain that's still present.   Past Medical History-  Patient Active Problem List   Diagnosis Date Noted   Osteoporosis 01/22/2022    Priority: High   Pacemaker 07/09/2021    Priority: High   Mobitz type 2 second degree atrioventricular block 06/20/2021    Priority: High   PAT (paroxysmal atrial tachycardia) 06/20/2021    Priority: High   PAF (paroxysmal atrial fibrillation) ([place]) 10/16/2020    Priority: High   PAD (peripheral artery disease) ([place]) 01/14/2017    Priority: High   Type 2 diabetes mellitus with peripheral neuropathy ([place]) 10/09/2016    Priority: High   Mesenteric artery stenosis (HCC) 04/20/2014    Priority: High   Abdominal pain, epigastric 01/19/2014    Priority: High   Congestive heart failure with LV diastolic dysfunction, NYHA class 2 ([place])     Priority: High   Celiac artery stenosis: 60% by Duplex - 90-95% by cath - Med Rx 11/27/2013    Priority: High   Renal artery stenosis ([place]) 08/29/2012    Priority: High   Coronary artery disease involving native coronary artery of native heart with angina pectoris ([place]) 10/30/2011    Priority: High   CAD S/P percutaneous coronary angioplasty: pRCA BMS, mLAD BMS overlapped prox with DES for ISR 07/30/2010    Priority: High   B12 deficiency 01/10/2022    Priority: Medium    Continuous severe abdominal pain 10/11/2016    Priority: Medium    Exertional dyspnea 03/16/2015    Priority: Medium    Idiopathic chronic pancreatitis suspected 03/10/2014    Priority: Medium    Abnormal CT of liver-possible cirrhosis 03/10/2014    Priority: Medium    Stasis edema of bilateral lower extremity     Priority: Medium    Essential hypertension     Priority: Medium     Hyperlipidemia associated with type 2 diabetes mellitus ([place])     Priority: Medium    Myalgia due to statin 12/29/2011    Priority: Medium    GERD (gastroesophageal reflux disease) 01/24/2009    Priority: Medium    Hypothyroid 11/12/2007    Priority: Medium    Irritable bowel syndrome 11/12/2007    Priority: Medium    PANCREAS DIVISUM 11/12/2007    Priority: Medium    Allergic rhinitis 01/10/2022    Priority: Low   Pneumothorax 07/10/2021    Priority: Low   Syncope and collapse 06/20/2021    Priority: Low   Hallux valgus (acquired), right foot 08/17/2018    Priority: Low   Costochondritis 01/21/2017    Priority: Low   Onychomycosis due to dermatophyte 11/16/2015    Priority: Low   Abnormal electrocardiogram during exercise stress test 03/15/2015    Priority: Low   Dysphagia 03/10/2014    Priority: Low   Long term current use of antithrombotics/antiplatelets - clopidogrel 03/10/2014    Priority: Low   Carotid stenosis 02/27/2014    Priority: Low   Leg cramps 01/19/2014    Priority: Low   Palpitations 01/12/2014    Priority: Low   Diarrhea due to malabsorption 01/24/2009    Priority: Low   Renal calculus 11/12/2007    Priority: Low   HIATAL HERNIA 11/20/2005  Priority: Low    Medications- reviewed and updated Current Outpatient Medications  Medication Sig Dispense Refill   alendronate (FOSAMAX) 70 MG tablet Take 1 tablet (70 mg total) by mouth every 7 (seven) days. Take with a full glass of water on an empty stomach. 5 tablet 11   allopurinol (ZYLOPRIM) 100 MG tablet Take 1 tablet (100 mg total) by mouth daily. 30 tablet 6   amiodarone (PACERONE) 200 MG tablet TAKE 400 MG (2 TABLETS) ONE DOSE AS NEED FOR A FAST HEART RATE ,IF AFTER 12 HOURS STILL PRESENT TAKE 200 MG ( 1 TABLET) ONE DOSE FOR EACH EPISODE 90 tablet 3   apixaban (ELIQUIS) 5 MG TABS tablet Take 1 tablet (5 mg total) by mouth 2 (two) times daily. 180 tablet 2   Artificial Tear Solution (TEARS RENEWED OP)  Apply 1 drop to eye daily as needed (dry eyes).     carvedilol (COREG) 6.25 MG tablet TAKE 1 TABLET BY MOUTH TWICE DAILY WITH A MEAL. MAY TAKE AN EXTRA TABLET DAILY IF HAVING PALPATIONS (Patient taking differently: Take 6.25 mg by mouth 2 (two) times daily with a meal.) 225 tablet 3   colchicine 0.6 MG tablet Take by mouth.     cyanocobalamin (,VITAMIN B-12,) 1000 MCG/ML injection 1000 mcg injection once per month. Please provide syringes as well 1 mL 15   diclofenac sodium (VOLTAREN) 1 % GEL Apply 2 g topically 4 (four) times daily as needed (pain).     furosemide (LASIX) 80 MG tablet TAKE 1 TABLET(80 MG) by mouth once a day , may take an additional 80 mg if needed for swelling (Patient taking differently: 80 mg. TAKE 1 TABLET(80 MG) by mouth twice a day , may take an additional 80 mg if needed for swelling) 180 tablet 3   HYDROcodone-acetaminophen (NORCO/VICODIN) 5-325 MG tablet Take 1 tablet by mouth every 6 (six) hours as needed for moderate pain or severe pain (do not take tylenol with this. do not drive for 8 hours after taking.). 20 tablet 0   hyoscyamine (ANASPAZ) 0.125 MG TBDP disintergrating tablet Place 1 tablet (0.125 mg total) under the tongue every 6 (six) hours as needed for cramping. 30 tablet 5   indapamide (LOZOL) 2.5 MG tablet TAKE ONE TABLET BY MOUTH DAILY, 30 MINUTES BEFORE DAILY FUROSEMIDE. 90 tablet 2   isosorbide mononitrate (IMDUR) 30 MG 24 hr tablet TAKE 1 TABLET BY MOUTH EVERY DAY 90 tablet 3   lidocaine (XYLOCAINE) 5 % ointment Apply 1 application. topically as needed. 35.44 g 0   losartan (COZAAR) 50 MG tablet Take 1 tablet (50 mg total) by mouth daily. 90 tablet 3   meclizine (ANTIVERT) 12.5 MG tablet TAKE 1 TABLET BY MOUTH 3 TIMES DAILY AS NEEDED FOR DIZZINESS. 30 tablet 1   nitroGLYCERIN (NITROSTAT) 0.4 MG SL tablet DISSOLVE 1 TABLET UNDER THE TONGUE EVERY 5 MINUTES AS NEEDED FOR CHEST PAIN 25 tablet 4   spironolactone (ALDACTONE) 25 MG tablet TAKE 1 TABLET BY MOUTH AS  DIRECTED. TAKE 1 TABLET ON MONDAYS, WEDNESDAYS, AND FRIDAYS AT 6 PM. 45 tablet 3   Zoster Vaccine Adjuvanted [facility]) injection Inject into the muscle. 1 each 1   No current facility-administered medications for this visit.     Objective:  BP 132/64   Pulse 86   Temp 98 F (36.7 C)   Ht 5' 8" (1.727 m)   Wt 201 lb 9.6 oz (91.4 kg)   SpO2 95%   BMI 30.65 kg/m  Gen: NAD, resting  comfortably CV: RRR no murmurs rubs or gallops Lungs: CTAB no crackles, wheeze, rhonchi Abdomen: tender LUQ and wraps around to left lateral ribs/flank- has occasionally has similar on the right Skin: warm, dry  Diabetic Foot Exam - Simple   Simple Foot Form Diabetic Foot exam was performed with the following findings: Yes 07/22/2022 10:59 AM  Visual Inspection No deformities, no ulcerations, no other skin breakdown bilaterally: Yes Sensation Testing See comments: Yes Pulse Check Posterior Tibialis and Dorsalis pulse intact bilaterally: Yes Comments No sensation        Assessment and Plan   #social update- husband with multiple myeloma- starts treatment December 2023   #Chronic left upper quadrant abdominal pain/left rib pain -Extensive prior work-up and years of issues -was 2-3 days every few months now 2-3 weeks of pain. Taking tylenol 1000 mg and advil 466m every 4 hours but skips overnight (discussed max doses) and using voltaren gel -Most recently with CT 06-03-22 with no clear cause of pain -prefers not to go back to [facility]- Dr. PNorman Herrlichhad referred  - Potential thickening around the colon sigmoid portion-I do not think related to pain but recommended follow-up with GI to discuss colonoscopy and if they have any other thoughts on pain.  She is scheduled to see GI in December - They did note some constipation and recommended trial of MiraLAX 1 capful daily and if loose stools hold for a day and restart half capful- but she already has some diarrhea so has opted out - has tried gabapentin    -saw chiropractor years ago but no help -tramadol has caused palpitations. Hydrocodone has not caused palpitations that she recurs (warned of constipation risk) - will give short course of hydrocodone with severity and persistence of pain but hoping we can find some answers for pain with GI visit (she prefers not to go back to [facility]) -recommended no tylenol while on hydrocodone as has tylenol it it -does plan to see Dr. CPatrice Paradiseback- apparently had MRI of the area in the past- I think that's a good idea. Had considered sports med but since already plugged in with Dr. CPatrice Paradise schedule follow up. Also sees Daldorf.   #CAD with angina- on imdur- despite PCI x2 (last 2011) #hyperlipidemia but with myalgias related to statins #Mobitz type 2 second degree AV block s/p pacemaker November 2022 S: Medication: imdur 30 mg, coreg 6.25 mg BID, on eliquis 5 mg with plavix discontinued - no cholesterol treatment as of 2023-statin intolerant and failed other lipid management - has not tried pcsk9 inhibitors - sparing central chest pain- no worse than usual. Also mildly short of breath. Resting helps os not needing nitroglycerin Lab Results  Component Value Date   CHOL 229 (H) 01/10/2022   HDL 36.90 (L) 01/10/2022   LDLCALC 152 (H) 09/08/2019   LDLDIRECT 147.0 01/10/2022   TRIG 327.0 (H) 01/10/2022   CHOLHDL 6 01/10/2022   A/P: CAD with stable angina- continue current meds Lipids above goal but wants to work on weight loss  # Atrial fibrillation- discovered by pacemaker interrogation (very symptomatic) S: Rate controlled on coreg 6.25 mg BID (extra tablet with palpitations) - on amiodarone 400 mg with fast HR, additional 200 12 hours later if persists per rx Anticoagulated with eliquis 5 mg BID A/P: appropriately anticoagulated and rate controlled- continue current medicine  # Diabetes S: Medication:Diet controlled Exercise and diet- wants to work on dietary changes with husband  Lab Results  Component  Value Date   HGBA1C 6.4 (  H) 04/29/2022   HGBA1C 5.9 01/10/2022   HGBA1C 6.0 09/01/2007  A/P: Too soon for repeat A1c but discussed upward trend on last labs and encouraged healthy eating/regular exercise if wants to remain off meds   #CHF diastolic, NYHA class III -also with venous stasis edema- wears compression #hypertension S: Medication:  lasix 80 mg (BID if needed for edema), indapaminde 2.5 mg, imdur 30 mg, losartan 50 mg, spironolactone 25 mg BP Readings from Last 3 Encounters:  07/22/22 132/64  05/22/22 130/76  04/29/22 126/60   Edema: stable minimal Weight gain: weight down slightly already Shortness of breath:  baseline is exertional dyspnea if overdose it  A/P: CHF stable. HTN- Controlled. Continue current medications.    #hypothyroidism S: listed on problem list but she does not take any medication  Lab Results  Component Value Date   TSH 3.050 04/29/2022  A/P:Controlled. Continue current medications.   #Gout S: Medication: Allopurinol 100 mg daily started after uric acid of 9.9 was noted  -Has colchicine on hand -Does not tolerate prednisone well -Initially noted June 2023 with right index finger swollen A/P:uric acid has been above goal- update uric acid with labs    #Thyroid right nodule # 1:Check every other year starting 06/10/2022 for 5 years-but could check yearly   # Osteoporosis S: Last DEXA: Confirmed osteoporosis 01/22/2022 with worst T score -2.9  Medication (bisphosphonate or prolia): Fosamax started 02/04/2022  Calcium: 1263m (through diet ok) recommended  Vitamin D: 1000 units a day recommended- bought 2000 units at [employer] will start  A/P: hopefully stable- continue current meds- make sure getting 1200 mg calcium in diet- gave handout   Recommended follow up: Return in about 2 months (around 09/21/2022) for followup or sooner if needed.Schedule b4 you leave. Future Appointments  Date Time Provider [place] 08/15/2022 11:30 AM  Zehr, JLaban Emperor PA-C [facility] 09/18/2022  4:20 PM HLeonie Man MD [facility] None  10/11/2022  7:00 AM [facility] DEVICE REMOTES [facility]  10/28/2022 11:40 AM Croitoru, MDani Gobble MD [facility] None  01/10/2023  7:00 AM [facility] DEVICE REMOTES [facility]  03/10/2023 10:30 AM [facility] HEALTH COACH [facility]  04/11/2023  7:00 AM [facility] DEVICE REMOTES [facility]  07/11/2023  7:00 AM [facility] DEVICE REMOTES [facility]  10/10/2023  7:00 AM [facility] DEVICE REMOTES [facility]  01/09/2024  7:00 AM [facility] DEVICE REMOTES [facility]    Lab/Order associations:   ICD-10-CM   1. Type 2 diabetes mellitus with peripheral neuropathy (HCC)  E11.42     2. Hypothyroidism, unspecified type  E03.9     3. Hyperlipidemia associated with type 2 diabetes mellitus ([place]  E11.69    E78.5     4. Essential hypertension  I10     5. LUQ pain  R10.12 Lipase    CBC with Differential/Platelet    Comprehensive metabolic panel    6. 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 do not drive for 8 hours after taking.).    Dispense:  20 tablet    Refill:  0    Return precautions advised.  SGarret Reddish MD

## 2022-07-22 NOTE — Patient Instructions (Addendum)
-   will give short course of hydrocodone with severity and persistence of pain but hoping we can find some answers for pain with GI visit (she prefers not to go back to [facility]) -recommended no tylenol while on hydrocodone as has tylenol it it  Please stop by lab before you go If you have mychart- we will send your results within 3 business days of [place] receiving them.  If you do not have mychart- we will call you about results within 5 business days of [place] receiving them.  *please also note that you will see labs on mychart as soon as they post. I will later go in and write notes on them- will say "notes from Dr. Yong Channel"   Team please give calcium in diet handout- goal '1200mg'$  per day  Recommended follow up: Return in about 2 months (around 09/21/2022) for followup or sooner if needed.Schedule b4 you leave.

## 2022-08-07 ENCOUNTER — Other Ambulatory Visit: Payer: Self-pay | Admitting: Family Medicine

## 2022-08-07 DIAGNOSIS — M1711 Unilateral primary osteoarthritis, right knee: Secondary | ICD-10-CM | POA: Diagnosis not present

## 2022-08-14 ENCOUNTER — Ambulatory Visit: Payer: Medicare Other | Admitting: Physician Assistant

## 2022-08-14 DIAGNOSIS — M1711 Unilateral primary osteoarthritis, right knee: Secondary | ICD-10-CM | POA: Diagnosis not present

## 2022-08-15 ENCOUNTER — Ambulatory Visit: Payer: Medicare Other | Admitting: Gastroenterology

## 2022-08-19 ENCOUNTER — Telehealth: Payer: Self-pay | Admitting: Family Medicine

## 2022-08-19 MED ORDER — HYDROCODONE-ACETAMINOPHEN 5-325 MG PO TABS: 1.0000 | ORAL_TABLET | Freq: Four times a day (QID) | ORAL | 0 refills | Status: DC | PRN

## 2022-08-19 NOTE — Telephone Encounter (Signed)
I sent this-goal would be for this to last to her next visit

## 2022-08-19 NOTE — Telephone Encounter (Signed)
..   Encourage patient to contact the pharmacy for refills or they can request refills through [place]:  Please schedule appointment if longer than 1 year  NEXT APPOINTMENT DATE:  MEDICATION:HYDROcodone-acetaminophen (NORCO/VICODIN) 5-325 MG tablet   Is the patient out of medication?  Yes   [facility]   Let patient know to contact pharmacy at the end of the day to make sure medication is ready. yes Please notify patient to allow 48-72 hours to process Yes

## 2022-08-19 NOTE — Telephone Encounter (Signed)
Called and lm on pt vm making her aware.

## 2022-08-27 ENCOUNTER — Telehealth: Payer: Self-pay | Admitting: Cardiovascular Disease

## 2022-08-27 DIAGNOSIS — I2109 ST elevation (STEMI) myocardial infarction involving other coronary artery of anterior wall: Secondary | ICD-10-CM

## 2022-08-27 DIAGNOSIS — I4719 Other supraventricular tachycardia: Secondary | ICD-10-CM

## 2022-08-27 DIAGNOSIS — I441 Atrioventricular block, second degree: Secondary | ICD-10-CM

## 2022-08-27 MED ORDER — APIXABAN 5 MG PO TABS: 5.0000 mg | ORAL_TABLET | Freq: Two times a day (BID) | ORAL | 3 refills | Status: DC

## 2022-08-27 NOTE — Telephone Encounter (Signed)
Prescription refill request for Eliquis received. Indication: Afib Last office visit: 03/29/22 [name])  Scr: 1.02 (07/22/22)  Age: 81 Weight: 91.4kg  Appropriate dose.  Pt requesting printed prescription.  Printed Prescription for Dr [name] to sign on Thursday, 08/29/22.  Called and confirmed with pt that she has enough medication to last until prescription can be signed.

## 2022-08-27 NOTE — Telephone Encounter (Signed)
*  STAT* If patient is at the pharmacy, call can be transferred to refill team.   1. Which medications need to be refilled? (please list name of each medication and dose if known)  apixaban (ELIQUIS) 5 MG TABS tablet   2. Which pharmacy/location (including street and city if local pharmacy) is medication to be sent to? Patient needs a printed script, as she orders it online.  Please call patient when ready for pick up.   3. Do they need a 30 day or 90 day supply? 90 day

## 2022-08-29 ENCOUNTER — Telehealth: Payer: Self-pay | Admitting: Cardiology

## 2022-08-29 ENCOUNTER — Encounter: Payer: Self-pay | Admitting: Cardiovascular Disease

## 2022-08-29 NOTE — Telephone Encounter (Signed)
error 

## 2022-08-29 NOTE — Telephone Encounter (Signed)
Called pt to notify her that a signature has been received on her printed eliquis prescription. She was thankful and will come tomorrow around 12 noon to pick up.

## 2022-08-29 NOTE — Telephone Encounter (Signed)
Pt c/o medication issue:  1. Name of Medication: apixaban (ELIQUIS) 5 MG TABS tablet   2. How are you currently taking this medication (dosage and times per day)? 1 tablet twice a day  3. Are you having a reaction (difficulty breathing--STAT)? no  4. What is your medication issue? Patient requesting to pick up a written prescription for eliquis so she can take it to a pharmacy in [place].

## 2022-08-30 NOTE — Telephone Encounter (Signed)
Prescription was signed and given to [facility]  08/29/22- to contact patient.

## 2022-09-05 ENCOUNTER — Other Ambulatory Visit (HOSPITAL_BASED_OUTPATIENT_CLINIC_OR_DEPARTMENT_OTHER): Payer: Self-pay

## 2022-09-17 ENCOUNTER — Ambulatory Visit: Payer: Medicare Other | Admitting: Family Medicine

## 2022-09-17 ENCOUNTER — Ambulatory Visit: Payer: Medicare Other | Admitting: Gastroenterology

## 2022-09-18 ENCOUNTER — Encounter: Payer: Self-pay | Admitting: Cardiology

## 2022-09-18 ENCOUNTER — Ambulatory Visit: Payer: Medicare Other | Attending: Cardiology | Admitting: Cardiology

## 2022-09-18 VITALS — BP 138/60 | HR 88 | Ht 68.0 in | Wt 197.2 lb

## 2022-09-18 DIAGNOSIS — I10 Essential (primary) hypertension: Secondary | ICD-10-CM | POA: Diagnosis not present

## 2022-09-18 DIAGNOSIS — I5030 Unspecified diastolic (congestive) heart failure: Secondary | ICD-10-CM

## 2022-09-18 DIAGNOSIS — I4719 Other supraventricular tachycardia: Secondary | ICD-10-CM | POA: Insufficient documentation

## 2022-09-18 DIAGNOSIS — I480 Paroxysmal atrial fibrillation: Secondary | ICD-10-CM | POA: Insufficient documentation

## 2022-09-18 DIAGNOSIS — E785 Hyperlipidemia, unspecified: Secondary | ICD-10-CM | POA: Insufficient documentation

## 2022-09-18 DIAGNOSIS — E1169 Type 2 diabetes mellitus with other specified complication: Secondary | ICD-10-CM | POA: Diagnosis not present

## 2022-09-18 DIAGNOSIS — I441 Atrioventricular block, second degree: Secondary | ICD-10-CM | POA: Insufficient documentation

## 2022-09-18 DIAGNOSIS — T466X5A Adverse effect of antihyperlipidemic and antiarteriosclerotic drugs, initial encounter: Secondary | ICD-10-CM | POA: Diagnosis not present

## 2022-09-18 DIAGNOSIS — I701 Atherosclerosis of renal artery: Secondary | ICD-10-CM | POA: Diagnosis not present

## 2022-09-18 DIAGNOSIS — Z9861 Coronary angioplasty status: Secondary | ICD-10-CM | POA: Diagnosis not present

## 2022-09-18 DIAGNOSIS — M791 Myalgia, unspecified site: Secondary | ICD-10-CM | POA: Insufficient documentation

## 2022-09-18 DIAGNOSIS — I2510 Atherosclerotic heart disease of native coronary artery without angina pectoris: Secondary | ICD-10-CM | POA: Diagnosis not present

## 2022-09-18 DIAGNOSIS — I25119 Atherosclerotic heart disease of native coronary artery with unspecified angina pectoris: Secondary | ICD-10-CM | POA: Diagnosis not present

## 2022-09-18 DIAGNOSIS — T466X5D Adverse effect of antihyperlipidemic and antiarteriosclerotic drugs, subsequent encounter: Secondary | ICD-10-CM

## 2022-09-18 DIAGNOSIS — I87302 Chronic venous hypertension (idiopathic) without complications of left lower extremity: Secondary | ICD-10-CM | POA: Insufficient documentation

## 2022-09-18 NOTE — Progress Notes (Signed)
Primary Care Provider: Marin Olp, [place] Cardiologist: Glenetta Hew, MD Electrophysiologist: Sanda Klein, MD  Clinic Note: Chief Complaint  Patient presents with   Follow-up    6 months.  Doing well.   Coronary Artery Disease    Stable mild off-and-on chest pain but nothing exertional not using nitroglycerin frequently   Atrial Fibrillation    As per Dr. Victorino December note, she does have episodes of fast heart rates which tend to be the PAT no more lasting spells are PAF.   ===================================  ASSESSMENT/PLAN   Problem List Items Addressed This Visit       Cardiology Problems   PAF (paroxysmal atrial fibrillation) (HCC) (Chronic)    Noted PAF on PPM interrogation.  We have previously just thought she had runs of PAT, but has had some episodes of A-fib lasting anywhere from 4 to 12 hours.  Usually more short shorter than that.  She is on carvedilol for baseline rate and rhythm control and is maintaining sinus rhythm pretty much consistently with breakthrough spells of PAT and PAF.  Probably not long enough to treat the PAT symptoms, but PAF if it lasts more than a few hours is usually well treated with amiodarone 400 mg daily.  The episodes that she has are not very long-lasting and therefore amiodarone is as much to maintain sinus rhythm for short period of time after going back into sinus from A-fib as it is simply cardiovert.  It seems to be working.  While amiodarone may not be the greatest option for pill in the pocket technique, with is infrequent as her episodes of prolonged A-fib are, it is a reasonable safe option.  Continue current regimen.  Amiodarone PRN with daily carvedilol.  Eliquis for anticoagulation       Hyperlipidemia associated with type 2 diabetes mellitus ([place]) (Chronic)    Again she continues to refuse taking any medications even including the inclisiran and PCSK9 habits.  She acknowledges the concerns and risks  and is willing to proceed without treatment.  For her diabetes, she is pretty well-controlled with most recent A1c of 6.4.  She is basically controlling her glycemic control with diet      Congestive heart failure with LV diastolic dysfunction, NYHA class 2 (HCC) (Chronic)    Seems to be pretty euvolemic she is on high-dose Lasix and indapamide along with spironolactone.  She really has not having any PND orthopnea.  Stable exertional dyspnea.  Edema is mostly dependent. Continue current diuretics along with carvedilol and losartan plus spironolactone.      CAD S/P percutaneous coronary angioplasty: pRCA BMS, mLAD BMS overlapped prox with DES for ISR - Primary (Chronic)    Last intervention was in 2012 with multiple cardiac catheterizations since then the most recent one was in July 2016 at [facility] finding widely patent stents in the RCA and LAD.  Elevated LVEDP. Most recent Myoview was in 2021 was nonischemic.  She is not really having active chest pain symptoms.  She is on a pretty stable regimen with exception of anything for lipid management which is [place] discussion point with her.  She is reluctant and refuses to take any cholesterol medications.  Plan: . Continue carvedilol, Imdur for antianginal benefit along with losartan for afterload reduction, and spironolactone.. Not on antiplatelet agents as she is now on long-term Eliquis for A-fib. As is the case every time I see her, she is not interested in discussing lipids.  She is not able to  extend having it checked.  She understands the risks.       Renal artery stenosis (HCC) (Chronic)    Follow-up with vascular surgery along with mesenteric disease as well.      PAT (paroxysmal atrial tachycardia) (Chronic)    Short-lived tachycardia spells well-controlled.  These do not last long enough for her to do anything follow-up pill in the pocket basis.  She usually just sits down and rests and they go away.      Mobitz type 2 second degree  atrioventricular block (Chronic)    S/p PPM placement.  Followed by Dr. Sallyanne Kuster.  Not pacing much, but was clearly necessary      Essential hypertension (Chronic)    Somewhat labile blood pressure.  Pressures today are actually pretty stable on current meds.  No change. Carvedilol 6.25 mg twice daily, losartan 50 mg daily, Imdur 30 mg daily, spironolactone 25 mg daily, furosemide 80 mg twice daily.      Coronary artery disease involving native coronary artery of native heart with angina pectoris ([place]) (Chronic)    She really has other types of angina.  Seems to have some resting symptoms but also sometimes has nocturnal chest pains.  These of all been evaluated with Myoview stress tests and they have not been significant enough to suggest ischemia.  As such, we will continue to monitor.  She is on stable regimen as noted above.        Other   Stasis edema of bilateral lower extremity (Chronic)    On high-dose of diuretics, tolerating well.      Myalgia due to statin (Chronic)    [name] refuses to take any statin.  Does not take any medications for cholesterol for fear of side effects.      Other Visit Diagnoses     Paroxysmal atrial fibrillation ([place])       Relevant Orders   EKG 12-Lead (Completed)   Atherosclerosis of renal artery (HCC)   (Chronic)        ===================================  HPI:    [name] is a 82 y.o. female with a PMH below who presents today for ~6 month f/u at the request of Marin Olp, MD.  CV History  CAD - 07/2010 - Progressive Angina: BMS PCI RCA & LAD (pre-op Back Sgx)  03/2011 Unstable Angina => LAD proximal ISR -> Overlapping DES w/ PTCA of jailed D2 02/2012: Patent Stents -> 08/2012 (peri-Op MI) patent stents Most recent cath was July 2016: Widely patent LAD stents with D2 jailed.  Minimal D2 stenosis.  Proximal to mid RCA BMS with less than 10% ISR.  OM1 and D1 60% stenosis.-Stable.  Myoview May 2021: No ischemia or  infarct.  LOW RISK => has chronic intermittent severe chest pain which she calls angina Chronic HFpEF Chronic bilateral LE edema Celiac Artery Stenosis - s/p Stent (2018) Bilateral Renal Atery Stenosis -> s/p L RA Stent (2011) Paroxysmal atrial tachycardia -> last anywhere from 30 seconds to 10 minutes. Symptomatic bradycardia with sinus pauses, Mobitz 2 AVB => s/p dual-chamber PPM Paroxysmal atrial fibrillation -> has short bursts lasting just a few hours. HLD-statin myopathy.  Unwilling to try any cholesterol medication.  I last saw [name] on March 29, 2022: She was doing well with no major complaints.  Maybe use amiodarone 3 times since last visit.  Usually only takes 1 tablet.  Had not used nitroglycerin in the long enough time for the prescription to wear out.  Remaining very busy baking and cooking and making jams jellies etc.  She is always busy.  Always on her feet has dependent edema.  Stable dose of diuretics keep her stable.  Short episodes of fast heart rate spells.  He does not use any causes for the next hour or 2.  Recent Hospitalizations: None  She was seen by Dr. Sallyanne Kuster for pacemaker follow-up on August 28.  She endorses issues with dizziness often occur when sitting up or getting out of a car after driving.  Also when bending over to pick something up.  Was denied place patient in outpatient rehab because of elevated blood pressures.  Usually at home they are down to the 1 teens.  No syncope.  No real exertional dyspnea, PND or orthopnea.  Occasional episodes of chest pain. => PPM interrogation showed 9% atrial pacing and 0.2% V pacing.  Only 1.3% A-fib burden most episodes last less than 10 minutes with some lasting up to 12 hours. => Usually A-fib rates are well-controlled, but atrial tachycardia are usually faster.  No major issues with Eliquis.  Seems euvolemic.  Reviewed  CV studies:    The following studies were reviewed today: (if available, images/films reviewed:  From Epic Chart or Care Everywhere) None:  Interval History:   BOBBIJO HOLST returns here today for routine follow-up doing pretty well.  She mated to the heavy baking time of Thanksgiving through Christmas she completed all her goals of making.  She still has her puffy edema because she is on her feet all day long but is otherwise doing much better patient still has her chest pains every now and then no real change from that.  They are not necessarily exertional.  She still has short little bursts of fast heart rates.  They usually do not last more than a few minutes, but she also feels the palpitations that are not as fast and may be last little longer.  She says it may be 3-4 times in the last 6 months she has had to use amiodarone.  Will she usually only takes a 40 mg tablet mass but also inquires. She is just taking her furosemide 80 mg twice daily along with Lozol which in conjunction with spironolactone seems to have her edema pretty well-controlled.  She still wears support hose, not necessarily tighter stockings.  Although she has fast heart rates bills and irregular heart rate spells they are not necessarily long enough lasting for her to feel any symptoms of syncope or near syncope.  No TIA or amaurosis fugax.  No claudication.  No melena, hematochezia, pneumaturia or epistaxis.  No major bleeding or bruising.  REVIEWED OF SYSTEMS   Review of Systems  Constitutional:  Positive for weight loss (Surprisingly her weight is down since September). Negative for malaise/fatigue (She runs out of steam after being in the kitchen all day long but does not stop.).  HENT:  Negative for congestion.   Respiratory:  Negative for cough and shortness of breath.   Genitourinary:  Positive for hematuria.  Musculoskeletal:  Positive for back pain and myalgias. Negative for joint pain.  Neurological:  Positive for dizziness. Negative for focal weakness and loss of consciousness.   Psychiatric/Behavioral: Negative.      I have reviewed and (if needed) personally updated the patient's problem list, medications, allergies, past medical and surgical history, social and family history.   PAST MEDICAL HISTORY   Past Medical History:  Diagnosis Date   Adrenal adenoma  Arthritis    "fingers, right shoulder" (09/09/2017)   Atrial fibrillation ([place]) 12/2021   New diagnosis noted on pacemaker interrogation.   Back pain with radiation    Chronic Back Pain - mutliple surgeries (including tumor removal)   Bilateral edema of lower extremity    Chronic, likely related to venous stasis   Bradycardia    Pacemaker placed   CAD S/P percutaneous coronary angioplasty    a) LHC: 07/30/10. -- 3.0x39m Integrity BMS to pRCA & 2.5x 137mIntegrity BMS mLAD(@ D2).  b) Class III-IV Angina 03/2011: LHC- ISR in LAD BMS -- prox overlapping Promus DES 2.5x1671mnd PTCA of jailed D2 ostium-prox 80%. c) 02/03/12:  LHC- patent stents.  Jailed diagonal. with stable flow; d) Peri-OP NSTEMI 04/2012 - LHC in 12/'13 -    Celiac artery stenosis ([place]  12/2016 - STENT placement   Complication of anesthesia    "used to wake up wild years ago" (09/09/2017)   Congestive heart failure with LV diastolic dysfunction, NYHA class 2 ([place]  06/13/10:  last 2D echo-  EF >55%, Mild TR, Mod Conc LVH - Grade 1 diastolic dysfunction (abnormal relaxation) --> LVEDP on Cath 28 mmHg & mild 2nd Pulm HTN   Diet-controlled type 2 diabetes mellitus ([place]  Diverticulitis of colon (without mention of hemorrhage)(562.11)    Diverticulosis    Dyslipidemia, goal LDL below 70    Intolerant to statins   GERD (gastroesophageal reflux disease)    Hepatitis ~ 1957   "yellow jaundice" (01/14/2017)   Hepatomegaly    Hiatal hernia    History of blood transfusion 04/2012   "when I had a heart attack"   History of kidney stones    "I've got a stone embedded in one of my kidneys" (09/09/2017)   History of stomach ulcers "years ago"    Irritable bowel syndrome (IBS)    Labile essential hypertension    Partially related to RAS   Mesenteric artery stenosis (HCC)    95% Celiac Artery - ostial, 20-30% SMA.  Bilateral Renal A: L RA stent patent, R RA 20-30% -- Conservative Management   Mobitz type 2 second degree AV block    Status post PPM placement   NSTEMI (non-ST elevated myocardial infarction) ([place]8/2013   Unclear the details, apparently this was postoperative from her back surgery that she was cleared for my last saw her in June.  Reportedly had stents placed   Pancreas divisum    On pancreatic enzyme   PAT (paroxysmal atrial tachycardia)    Renal artery stenosis ([place]; 12/'13   a) Angiogram 02/03/12:  50-60%L RA stenosis, 40% R R Inferior artery; b) 12/'13: S/P L RA Stent ([facility]) 6.0 mm x 15 mm; c) Renal Duplex 10/2013: <60% L RA, <60 R RA, ~60% SMA & Celiac A.   S/P placement of cardiac pacemaker 07/2021   Medtronic   Stricture and stenosis of esophagus    Thyroid nodule    Unspecified gastritis and gastroduodenitis without mention of hemorrhage     PAST SURGICAL HISTORY   Past Surgical History:  Procedure Laterality Date   APPENDECTOMY     BACK SURGERY     BREAST DUCTAL SYSTEM EXCISION     right   BREAST SURGERY Right    CARDIAC CATHETERIZATION N/A 03/16/2015   Procedure: Left Heart Cath and Coronary Angiography;  Surgeon: DavLeonie ManD;  Location: [facility];  Service: Cardiovascular; Widely patent m-dLAD  stents as wellas pRCA stent.  High LVEDP, small Diag & Om vessels with moderate stenosis    Cardiac Event Monitor  01/2017   Mostly NSR with occasional sinus tachycardia and rare bradycardia. No A. fib. No PND or PSVT. Rare PACs and PVCs.   CATARACT EXTRACTION W/ INTRAOCULAR LENS  IMPLANT, BILATERAL Bilateral    CHOLECYSTECTOMY OPEN     COLONOSCOPY  03/01/2009   normal    CORONARY ANGIOPLASTY WITH STENT PLACEMENT  07/30/2010   3.0x63m Integrity BMS to RCA and 2.5x 155m Integrity BMS LAD.     CORONARY ANGIOPLASTY WITH STENT PLACEMENT  03/18/2011   Cutting Balloon PTCA of D2 (jailed - 80% ostial stenosis), DES PCI of mid LAD ISR - > Promus DES 2.5 x 16 mm postdilated to 2.8 mm) covering the proximal portion of the previous stent   DOBUTAMINE STRESS ECHO  03/07/2015   [facility] (ordered for pre-op evaluation for EUS/ERCP --> abnormal EKG:  strreesss test shhoowwed 1 mm ST segment depressions downsloping. No wall motion abnormalities at peak exercise or at rest. Diastolic dysfunction was noted but normal systolic function - EF greater than 55%. No bouts of regurgitation or stenosis. Resting hypertension with exaggerated response   ESOPHAGOGASTRODUODENOSCOPY  04/08/2012   ESOPHAGOGASTRODUODENOSCOPY (EGD) WITH ESOPHAGEAL DILATION  X 2   FRACTURE SURGERY     HAMMER TOE SURGERY Bilateral    "took bone off the top of 2nd toe on each foot"   HIP SURGERY Left    "something to do w/my back"   JOINT REPLACEMENT     KNEE SURGERY Left    "had fluid drained off it a couple times"   LEFT HEART CATH AND CORONARY ANGIOGRAPHY  07/30/2010   severe LAD-diagonal 80%, moderate to severe proximal RCA.  Mean PAP 15 mmHg.  PCWP mean 17 mmHg.  RVP 45/11 mmHg.  PAP 47/24 mmHg, mean 31 mmHg.  LVEDP 27 mmHg   LEFT HEART CATHETERIZATION WITH CORONARY ANGIOGRAM N/A 02/03/2012   Procedure: LEFT HEART CATHETERIZATION WITH CORONARY ANGIOGRAM;  Surgeon: DaLeonie ManMD;  [facility]; widely patent LAD and RCA stents.  Patent D2 ostium.  Moderate L Renal A stenosis (56%), R Renal A 40-50% t.  LVEDP 20 mmHg.   LEFT HEART CATHETERIZATION WITH CORONARY ANGIOGRAM N/A 05/12/2014   Procedure: LEFT HEART CATHETERIZATION WITH CORONARY ANGIOGRAM;  Surgeon: JoLorretta HarpMD;  [facility];  Service: Cardiovascular: Stable CAD. Patent stents. Patent renal artery stent   LEFT HEART CATHETERIZATION WITH CORONARY ANGIOGRAM  08/2012   Peri-Op MI @ [facility] -- 40% ostial D1, patent  LAD stents, 10% RCA ISR   LEFT HEART CATHETERIZATION WITH CORONARY ANGIOGRAM   03/18/2011   70% ISR of LAD stent just after D2 (D2 has ostial 80 to 90% stenosis.  20 to 30% ISR RCA.  EDP elevated at 28 mmHg   NM MYOVIEW LTD  12/2016   LOW RISK study. No ischemia or infarction. EF 65-75%.   NM MYOVIEW LTD  01/05/2020    LOW RISK. EF 60-65%.  No EKG changes.  No infarct, no ischemia.   PACEMAKER IMPLANT N/A 07/09/2021   Procedure: PACEMAKER IMPLANT;  Surgeon: CrSanda KleinMD;  [facility];  Service: Cardiovascular;  Laterality: N/A;   PANCREAS SURGERY     "stent in my pancreas"; Dr PePershing Proud PERCUTANEOUS PINNING PHALANX FRACTURE OF HAND Left ~ 2013   PERIPHERAL VASCULAR BALLOON ANGIOPLASTY  09/09/2017  Procedure: PERIPHERAL VASCULAR BALLOON ANGIOPLASTY;  Surgeon: Serafina Mitchell, MD;  Location: [place] CV LAB;  Service: Cardiovascular;;  Celiac instent   PERIPHERAL VASCULAR INTERVENTION  01/14/2017   Procedure: Peripheral Vascular Intervention;  Surgeon: Serafina Mitchell, MD;  Location: [place] [facility] LAB;  Service: Cardiovascular;;  mesentric   RENAL ARTERY STENT Left 08/2012   @ [facility] - 6.0 mm x 15 mm   SPINE SURGERY     tumor removed 07/2010; Redo Surgery 04/2012; Sacroiliac Sgx 08/2013   TOTAL ABDOMINAL HYSTERECTOMY     TOTAL KNEE ARTHROPLASTY Left ~ 2012   TRANSTHORACIC ECHOCARDIOGRAM  07/10/2021   (Postop PPM): Normal LV size and function.  EF 60 to 65%.  No RWMA.  GR 1 DD.  Mild LA dilation.  Normal RV size and function.  Normal RAP.  Normal aortic and mitral valves.  No pericardial effusion.   VISCERAL ANGIOGRAM N/A 05/12/2014   Procedure: VISCERAL ANGIOGRAM;  Surgeon: Lorretta Harp, MD;  Location: [facility] CATH LAB;  25% ostial proximal celiac artery with downward takeoff.  23% proximal SMA and 56% proximal IMA.  Left renal artery stent widely patent.  Right renal artery is 20 to 30% proximal stenosis.   VISCERAL ANGIOGRAPHY N/A 01/14/2017    Procedure: Mesenteric  Angiography;  Surgeon: Serafina Mitchell, MD;  Location: [place] CV LAB;  Service: Cardiovascular;  Laterality: N/A;   VISCERAL ANGIOGRAPHY N/A 09/09/2017   Procedure: VISCERAL ANGIOGRAPHY;  Surgeon: Serafina Mitchell, MD;  Location: [place] CV LAB;  Service: Cardiovascular;  Laterality: N/A;    Immunization History  Administered Date(s) Administered   Tdap 03/19/2012   Zoster Recombinat (Shingrix) 06/03/2022, 09/05/2022   Zoster, Live 03/31/2009    MEDICATIONS/ALLERGIES   Current Meds  Medication Sig   alendronate (FOSAMAX) 70 MG tablet Take 1 tablet (70 mg total) by mouth every 7 (seven) days. Take with a full glass of water on an empty stomach.   allopurinol (ZYLOPRIM) 100 MG tablet TAKE 1 TABLET BY MOUTH EVERY DAY   amiodarone (PACERONE) 200 MG tablet TAKE 400 MG (2 TABLETS) ONE DOSE AS NEED FOR A FAST HEART RATE ,IF AFTER 12 HOURS STILL PRESENT TAKE 200 MG ( 1 TABLET) ONE DOSE FOR EACH EPISODE   apixaban (ELIQUIS) 5 MG TABS tablet Take 1 tablet (5 mg total) by mouth 2 (two) times daily.   Artificial Tear Solution (TEARS RENEWED OP) Apply 1 drop to eye daily as needed (dry eyes).   carvedilol (COREG) 6.25 MG tablet TAKE 1 TABLET BY MOUTH TWICE DAILY WITH A MEAL. MAY TAKE AN EXTRA TABLET DAILY IF HAVING PALPATIONS (Patient taking differently: Take 6.25 mg by mouth 2 (two) times daily with a meal.)   colchicine 0.6 MG tablet Take by mouth.   cyanocobalamin (,VITAMIN B-12,) 1000 MCG/ML injection 1000 mcg injection once per month. Please provide syringes as well   diclofenac sodium (VOLTAREN) 1 % GEL Apply 2 g topically 4 (four) times daily as needed (pain).   furosemide (LASIX) 80 MG tablet TAKE 1 TABLET(80 MG) by mouth once a day , may take an additional 80 mg if needed for swelling (Patient taking differently: 80 mg. TAKE 1 TABLET(80 MG) by mouth twice a day , may take an additional 80 mg if needed for swelling)   HYDROcodone-acetaminophen (NORCO/VICODIN)  5-325 MG tablet Take 1 tablet by mouth every 6 (six) hours as needed for moderate pain or severe pain (do not take tylenol with this. do not  drive for 8 hours after taking.).   hyoscyamine (ANASPAZ) 0.125 MG TBDP disintergrating tablet Place 1 tablet (0.125 mg total) under the tongue every 6 (six) hours as needed for cramping.   indapamide (LOZOL) 2.5 MG tablet TAKE ONE TABLET BY MOUTH DAILY, 30 MINUTES BEFORE DAILY FUROSEMIDE.   isosorbide mononitrate (IMDUR) 30 MG 24 hr tablet TAKE 1 TABLET BY MOUTH EVERY DAY   lidocaine (XYLOCAINE) 5 % ointment Apply 1 application. topically as needed.   losartan (COZAAR) 50 MG tablet Take 1 tablet (50 mg total) by mouth daily.   meclizine (ANTIVERT) 12.5 MG tablet TAKE 1 TABLET BY MOUTH 3 TIMES DAILY AS NEEDED FOR DIZZINESS.   nitroGLYCERIN (NITROSTAT) 0.4 MG SL tablet DISSOLVE 1 TABLET UNDER THE TONGUE EVERY 5 MINUTES AS NEEDED FOR CHEST PAIN   spironolactone (ALDACTONE) 25 MG tablet TAKE 1 TABLET BY MOUTH AS DIRECTED. TAKE 1 TABLET ON MONDAYS, WEDNESDAYS, AND FRIDAYS AT 6 PM.   Zoster Vaccine Adjuvanted [facility]) injection Inject into the muscle.    Allergies  Allergen Reactions   Codeine Phosphate Anaphylaxis, Shortness Of Breath and Swelling   Contrast Media [Iodinated Contrast Media] Swelling    Swelling of the face. Reaction was to ionic contrast many years ago. Patient has had non-ionic contrast many time without pre medication and has had no reaction.    Penicillin G Anaphylaxis    Patient denies allergy to penicillin.   Iodine Swelling and Rash    Many years ago. Topical iodine only.    Prednisone Other (See Comments)    Hyperactivity, insomnia   Statins Other (See Comments)    Hyperactivity   Tramadol Palpitations and Other (See Comments)    Hyperactivity    SOCIAL HISTORY/FAMILY HISTORY   Reviewed in Epic:  Pertinent findings:  Social History   Tobacco Use   Smoking status: Never   Smokeless tobacco: Never  Vaping Use   Vaping  Use: Never used  Substance Use Topics   Alcohol use: No   Drug use: No   Social History   Social History Narrative   Married 16 years in 2023. 1 child Conne in [place].  1 grandchild in [place].       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       Hobbies: cooking/baking, loves to feed people    OBJCTIVE -PE, EKG, labs   Wt Readings from Last 3 Encounters:  09/18/22 197 lb 3.2 oz (89.4 kg)  07/22/22 201 lb 9.6 oz (91.4 kg)  05/22/22 204 lb 3.2 oz (92.6 kg)    Physical Exam: BP 138/60   Pulse 88   Ht '5\' 8"'$  (1.727 m)   Wt 197 lb 3.2 oz (89.4 kg)   SpO2 97%   BMI 29.98 kg/m  Physical Exam Vitals reviewed.  Constitutional:      General: She is not in acute distress.    Appearance: Normal appearance. She is obese. She is not ill-appearing or toxic-appearing.     Comments: Borderline obese.  Otherwise well-nourished well-groomed.  HENT:     Head: Normocephalic and atraumatic.  Neck:     Vascular: No carotid bruit.  Cardiovascular:     Rate and Rhythm: Normal rate and regular rhythm. Occasional Extrasystoles are present.    Chest Wall: PMI is not displaced.     Pulses: Intact distal pulses. Decreased pulses (  Difficult to palpate due to puffy swelling).     Heart sounds: S1 normal and S2 normal. Heart sounds are distant.     No friction rub. No gallop.  Pulmonary:     Effort: Pulmonary effort is normal. No respiratory distress.     Breath sounds: Normal breath sounds. No wheezing, rhonchi or rales.  Chest:     Chest wall: Tenderness present.  Musculoskeletal:        General: Swelling (Mild puffy swelling.  Support stockings) present. Normal range of motion.     Cervical back: Normal range of motion and neck supple.  Skin:    General: Skin is warm and dry.     Coloration: Skin is not pale.  Neurological:      General: No focal deficit present.     Mental Status: She is alert and oriented to person, place, and time.     Cranial Nerves: No cranial nerve deficit.     Gait: Gait abnormal.  Psychiatric:        Mood and Affect: Mood normal.        Behavior: Behavior normal.        Thought Content: Thought content normal.        Judgment: Judgment normal.     Comments: In good spirits.  Seems to doing well.     Adult ECG Report  Rate: 88;  Rhythm: normal sinus rhythm and nonspecific ST and T wave changes.  Cannot exclude inferior ischemia.  Anterior MI, age-indeterminate. ;   Narrative Interpretation: Stable  Recent Labs: Reviewed Lab Results  Component Value Date   CHOL 229 (H) 01/10/2022   HDL 36.90 (L) 01/10/2022   LDLCALC 152 (H) 09/08/2019   LDLDIRECT 147.0 01/10/2022   TRIG 327.0 (H) 01/10/2022   CHOLHDL 6 01/10/2022   Lab Results  Component Value Date   CREATININE 1.02 07/22/2022   BUN 39 (H) 07/22/2022   NA 137 07/22/2022   K 4.0 07/22/2022   CL 100 07/22/2022   CO2 29 07/22/2022      Latest Ref Rng & Units 07/22/2022   11:25 AM 05/22/2022   10:38 AM 04/29/2022   12:30 PM  CBC  WBC 4.0 - 10.5 K/uL 7.0  6.6  7.8   Hemoglobin 12.0 - 15.0 g/dL 12.7  11.8  12.3   Hematocrit 36.0 - 46.0 % 37.0  34.9  36.8   Platelets 150.0 - 400.0 K/uL 268.0  226.0  280     Lab Results  Component Value Date   HGBA1C 6.4 (H) 04/29/2022   Lab Results  Component Value Date   TSH 3.050 04/29/2022    ================================================== I spent a total of 24 minutes with the patient spent in direct patient consultation.  Additional time spent with chart review  / charting (studies, outside notes, etc): 17 min Total Time: 41 min  Current medicines are reviewed at length with the patient today.  (+/- concerns) none  Notice: This dictation was prepared with Dragon dictation along with smart phrase technology. Any transcriptional errors that result from this process are  unintentional and may not be corrected upon review.  Studies Ordered:   Orders Placed This Encounter  Procedures   EKG 12-Lead   No orders of the defined types were placed in this encounter.   Patient Instructions / Medication Changes & Studies & Tests Ordered   Patient Instructions  Medication Instructions:    If you  have an episode of Afib  lasting longer than 2  hours -  use Amiodarone e 400 mg    *If you need a refill on your cardiac medications before your next appointment, please call your pharmacy*   Lab Work: No changes    Testing/Procedures:  Not needed  Follow-Up: At [facility], you and your health needs are our priority.  As part of our continuing mission to provide you with exceptional heart care, we have created designated Provider Care Teams.  These Care Teams include your primary Cardiologist (physician) and Advanced Practice Providers (APPs -  Physician Assistants and Nurse Practitioners) who all work together to provide you with the care you need, when you need it.     Your next appointment:   6 month(s)  The format for your next appointment:   In Person  Provider:   Glenetta Hew, MD         Leonie Man, MD, MS Glenetta Hew, M.D., M.S. Interventional Cardiologist  [place]  Pager # [phone] Phone # [phone] [address] [place], [place] [address]   Thank you for choosing [facility] [place]!!

## 2022-09-18 NOTE — Patient Instructions (Addendum)
Medication Instructions:    If you  have an episode of Afib  lasting longer than 2 hours -  use Amiodarone e 400 mg    *If you need a refill on your cardiac medications before your next appointment, please call your pharmacy*   Lab Work: No changes    Testing/Procedures:  Not needed  Follow-Up: At [facility], you and your health needs are our priority.  As part of our continuing mission to provide you with exceptional heart care, we have created designated Provider Care Teams.  These Care Teams include your primary Cardiologist (physician) and Advanced Practice Providers (APPs -  Physician Assistants and Nurse Practitioners) who all work together to provide you with the care you need, when you need it.     Your next appointment:   6 month(s)  The format for your next appointment:   In Person  Provider:   Glenetta Hew, MD

## 2022-09-23 ENCOUNTER — Encounter: Payer: Self-pay | Admitting: Cardiology

## 2022-09-23 NOTE — Assessment & Plan Note (Signed)
Seems to be pretty euvolemic she is on high-dose Lasix and indapamide along with spironolactone.  She really has not having any PND orthopnea.  Stable exertional dyspnea.  Edema is mostly dependent. Continue current diuretics along with carvedilol and losartan plus spironolactone.

## 2022-09-23 NOTE — Assessment & Plan Note (Signed)
She really has other types of angina.  Seems to have some resting symptoms but also sometimes has nocturnal chest pains.  These of all been evaluated with Myoview stress tests and they have not been significant enough to suggest ischemia.  As such, we will continue to monitor.  She is on stable regimen as noted above.

## 2022-09-23 NOTE — Assessment & Plan Note (Signed)
Somewhat labile blood pressure.  Pressures today are actually pretty stable on current meds.  No change. Carvedilol 6.25 mg twice daily, losartan 50 mg daily, Imdur 30 mg daily, spironolactone 25 mg daily, furosemide 80 mg twice daily.

## 2022-09-23 NOTE — Assessment & Plan Note (Addendum)
Last intervention was in 2012 with multiple cardiac catheterizations since then the most recent one was in July 2016 at [facility] finding widely patent stents in the RCA and LAD.  Elevated LVEDP. Most recent Myoview was in 2021 was nonischemic.  She is not really having active chest pain symptoms.  She is on a pretty stable regimen with exception of anything for lipid management which is [place] discussion point with her.  She is reluctant and refuses to take any cholesterol medications.  Plan: . Continue carvedilol, Imdur for antianginal benefit along with losartan for afterload reduction, and spironolactone.. Not on antiplatelet agents as she is now on long-term Eliquis for A-fib. As is the case every time I see her, she is not interested in discussing lipids.  She is not able to extend having it checked.  She understands the risks.

## 2022-09-24 NOTE — Assessment & Plan Note (Signed)
[name] refuses to take any statin.  Does not take any medications for cholesterol for fear of side effects.

## 2022-09-24 NOTE — Assessment & Plan Note (Signed)
Follow-up with vascular surgery along with mesenteric disease as well.

## 2022-09-24 NOTE — Assessment & Plan Note (Signed)
On high-dose of diuretics, tolerating well.

## 2022-09-24 NOTE — Assessment & Plan Note (Signed)
Short-lived tachycardia spells well-controlled.  These do not last long enough for her to do anything follow-up pill in the pocket basis.  She usually just sits down and rests and they go away.

## 2022-09-24 NOTE — Assessment & Plan Note (Signed)
S/p PPM placement.  Followed by Dr. Sallyanne Kuster.  Not pacing much, but was clearly necessary

## 2022-09-24 NOTE — Assessment & Plan Note (Signed)
Noted PAF on PPM interrogation.  We have previously just thought she had runs of PAT, but has had some episodes of A-fib lasting anywhere from 4 to 12 hours.  Usually more short shorter than that.  She is on carvedilol for baseline rate and rhythm control and is maintaining sinus rhythm pretty much consistently with breakthrough spells of PAT and PAF.  Probably not long enough to treat the PAT symptoms, but PAF if it lasts more than a few hours is usually well treated with amiodarone 400 mg daily.  The episodes that she has are not very long-lasting and therefore amiodarone is as much to maintain sinus rhythm for short period of time after going back into sinus from A-fib as it is simply cardiovert.  It seems to be working.  While amiodarone may not be the greatest option for pill in the pocket technique, with is infrequent as her episodes of prolonged A-fib are, it is a reasonable safe option.  Continue current regimen.  Amiodarone PRN with daily carvedilol.  Eliquis for anticoagulation

## 2022-09-24 NOTE — Assessment & Plan Note (Signed)
Again she continues to refuse taking any medications even including the inclisiran and PCSK9 habits.  She acknowledges the concerns and risks and is willing to proceed without treatment.  For her diabetes, she is pretty well-controlled with most recent A1c of 6.4.  She is basically controlling her glycemic control with diet

## 2022-09-29 ENCOUNTER — Other Ambulatory Visit: Payer: Self-pay | Admitting: Physician Assistant

## 2022-09-29 ENCOUNTER — Other Ambulatory Visit: Payer: Self-pay | Admitting: Family Medicine

## 2022-09-29 ENCOUNTER — Other Ambulatory Visit: Payer: Self-pay | Admitting: Cardiology

## 2022-09-29 DIAGNOSIS — I5030 Unspecified diastolic (congestive) heart failure: Secondary | ICD-10-CM

## 2022-09-29 DIAGNOSIS — H8110 Benign paroxysmal vertigo, unspecified ear: Secondary | ICD-10-CM

## 2022-09-29 DIAGNOSIS — I25119 Atherosclerotic heart disease of native coronary artery with unspecified angina pectoris: Secondary | ICD-10-CM

## 2022-09-29 DIAGNOSIS — I2510 Atherosclerotic heart disease of native coronary artery without angina pectoris: Secondary | ICD-10-CM

## 2022-10-11 ENCOUNTER — Ambulatory Visit (INDEPENDENT_AMBULATORY_CARE_PROVIDER_SITE_OTHER): Payer: Medicare Other

## 2022-10-11 DIAGNOSIS — I441 Atrioventricular block, second degree: Secondary | ICD-10-CM | POA: Diagnosis not present

## 2022-10-15 LAB — CUP PACEART REMOTE DEVICE CHECK
Battery Remaining Longevity: 167 mo
Battery Voltage: 3.05 V
Brady Statistic AP VP Percent: 0.03 %
Brady Statistic AP VS Percent: 4.52 %
Brady Statistic AS VP Percent: 0.03 %
Brady Statistic AS VS Percent: 95.42 %
Brady Statistic RA Percent Paced: 4.52 %
Brady Statistic RV Percent Paced: 0.06 %
Date Time Interrogation Session: 20240212234958
Implantable Lead Connection Status: 753985
Implantable Lead Connection Status: 753985
Implantable Lead Implant Date: 20221107
Implantable Lead Implant Date: 20221107
Implantable Lead Location: 753859
Implantable Lead Location: 753860
Implantable Lead Model: 5076
Implantable Lead Model: 5076
Implantable Pulse Generator Implant Date: 20221107
Lead Channel Impedance Value: 361 Ohm
Lead Channel Impedance Value: 437 Ohm
Lead Channel Impedance Value: 456 Ohm
Lead Channel Impedance Value: 494 Ohm
Lead Channel Pacing Threshold Amplitude: 0.625 V
Lead Channel Pacing Threshold Amplitude: 0.625 V
Lead Channel Pacing Threshold Pulse Width: 0.4 ms
Lead Channel Pacing Threshold Pulse Width: 0.4 ms
Lead Channel Sensing Intrinsic Amplitude: 10.5 mV
Lead Channel Sensing Intrinsic Amplitude: 10.5 mV
Lead Channel Sensing Intrinsic Amplitude: 3.75 mV
Lead Channel Sensing Intrinsic Amplitude: 3.75 mV
Lead Channel Setting Pacing Amplitude: 1.5 V
Lead Channel Setting Pacing Amplitude: 2 V
Lead Channel Setting Pacing Pulse Width: 0.4 ms
Lead Channel Setting Sensing Sensitivity: 1.2 mV
Zone Setting Status: 755011
Zone Setting Status: 755011

## 2022-10-17 ENCOUNTER — Ambulatory Visit (INDEPENDENT_AMBULATORY_CARE_PROVIDER_SITE_OTHER): Payer: Medicare Other | Admitting: Family Medicine

## 2022-10-17 ENCOUNTER — Encounter: Payer: Self-pay | Admitting: Family Medicine

## 2022-10-17 VITALS — BP 110/60 | HR 85 | Temp 97.3°F | Ht 68.0 in | Wt 200.6 lb

## 2022-10-17 DIAGNOSIS — E1142 Type 2 diabetes mellitus with diabetic polyneuropathy: Secondary | ICD-10-CM

## 2022-10-17 DIAGNOSIS — E785 Hyperlipidemia, unspecified: Secondary | ICD-10-CM

## 2022-10-17 DIAGNOSIS — I25119 Atherosclerotic heart disease of native coronary artery with unspecified angina pectoris: Secondary | ICD-10-CM

## 2022-10-17 DIAGNOSIS — E1169 Type 2 diabetes mellitus with other specified complication: Secondary | ICD-10-CM | POA: Diagnosis not present

## 2022-10-17 DIAGNOSIS — I739 Peripheral vascular disease, unspecified: Secondary | ICD-10-CM | POA: Diagnosis not present

## 2022-10-17 DIAGNOSIS — I10 Essential (primary) hypertension: Secondary | ICD-10-CM

## 2022-10-17 DIAGNOSIS — R252 Cramp and spasm: Secondary | ICD-10-CM | POA: Diagnosis not present

## 2022-10-17 DIAGNOSIS — I5030 Unspecified diastolic (congestive) heart failure: Secondary | ICD-10-CM | POA: Diagnosis not present

## 2022-10-17 DIAGNOSIS — I701 Atherosclerosis of renal artery: Secondary | ICD-10-CM

## 2022-10-17 MED ORDER — ALLOPURINOL 100 MG PO TABS: 200.0000 mg | ORAL_TABLET | Freq: Every day | ORAL | 3 refills | Status: DC

## 2022-10-17 MED ORDER — HYDROCODONE-ACETAMINOPHEN 5-325 MG PO TABS: 1.0000 | ORAL_TABLET | Freq: Four times a day (QID) | ORAL | 0 refills | Status: DC | PRN

## 2022-10-17 NOTE — Progress Notes (Addendum)
Phone [phone] In person visit   Subjective:   [name] is a 82 y.o. year old very pleasant female patient who presents for/with See problem oriented charting Chief Complaint  Patient presents with   Follow-up    Pt states legs cramp, fatigue and finger cramp   Diabetes   Hypertension    Past Medical History-  Patient Active Problem List   Diagnosis Date Noted   Osteoporosis 01/22/2022    Priority: High   Pacemaker 07/09/2021    Priority: High   Mobitz type 2 second degree atrioventricular block 06/20/2021    Priority: High   PAT (paroxysmal atrial tachycardia) 06/20/2021    Priority: High   PAF (paroxysmal atrial fibrillation) ([place]) 10/16/2020    Priority: High   PAD (peripheral artery disease) ([place]) 01/14/2017    Priority: High   Type 2 diabetes mellitus with peripheral neuropathy ([place]) 10/09/2016    Priority: High   Mesenteric artery stenosis (HCC) 04/20/2014    Priority: High   Abdominal pain, epigastric 01/19/2014    Priority: High   Congestive heart failure with LV diastolic dysfunction, NYHA class 2 ([place])     Priority: High   Celiac artery stenosis: 60% by Duplex - 90-95% by cath - Med Rx 11/27/2013    Priority: High   Renal artery stenosis ([place]) 08/29/2012    Priority: High   Coronary artery disease involving native coronary artery of native heart with angina pectoris ([place]) 10/30/2011    Priority: High   CAD S/P percutaneous coronary angioplasty: pRCA BMS, mLAD BMS overlapped prox with DES for ISR 07/30/2010    Priority: High   B12 deficiency 01/10/2022    Priority: Medium    Continuous severe abdominal pain 10/11/2016    Priority: Medium    Exertional dyspnea 03/16/2015    Priority: Medium    Idiopathic chronic pancreatitis suspected 03/10/2014    Priority: Medium    Abnormal CT of liver-possible cirrhosis 03/10/2014    Priority: Medium    Stasis edema of bilateral lower extremity     Priority: Medium    Essential hypertension      Priority: Medium    Hyperlipidemia associated with type 2 diabetes mellitus ([place])     Priority: Medium    Myalgia due to statin 12/29/2011    Priority: Medium    GERD (gastroesophageal reflux disease) 01/24/2009    Priority: Medium    Hypothyroid 11/12/2007    Priority: Medium    Irritable bowel syndrome 11/12/2007    Priority: Medium    PANCREAS DIVISUM 11/12/2007    Priority: Medium    Allergic rhinitis 01/10/2022    Priority: Low   Pneumothorax 07/10/2021    Priority: Low   Syncope and collapse 06/20/2021    Priority: Low   Hallux valgus (acquired), right foot 08/17/2018    Priority: Low   Costochondritis 01/21/2017    Priority: Low   Onychomycosis due to dermatophyte 11/16/2015    Priority: Low   Abnormal electrocardiogram during exercise stress test 03/15/2015    Priority: Low   Dysphagia 03/10/2014    Priority: Low   Long term current use of antithrombotics/antiplatelets - clopidogrel 03/10/2014    Priority: Low   Carotid stenosis 02/27/2014    Priority: Low   Leg cramps 01/19/2014    Priority: Low   Palpitations 01/12/2014    Priority: Low   Diarrhea due to malabsorption 01/24/2009    Priority: Low   Renal calculus 11/12/2007    Priority: Low  HIATAL HERNIA 11/20/2005    Priority: Low    Medications- reviewed and updated Current Outpatient Medications  Medication Sig Dispense Refill   alendronate (FOSAMAX) 70 MG tablet Take 1 tablet (70 mg total) by mouth every 7 (seven) days. Take with a full glass of water on an empty stomach. 5 tablet 11   amiodarone (PACERONE) 200 MG tablet TAKE 400 MG (2 TABLETS) ONE DOSE AS NEED FOR A FAST HEART RATE ,IF AFTER 12 HOURS STILL PRESENT TAKE 200 MG ( 1 TABLET) ONE DOSE FOR EACH EPISODE 180 tablet 1   apixaban (ELIQUIS) 5 MG TABS tablet Take 1 tablet (5 mg total) by mouth 2 (two) times daily. 180 tablet 3   Artificial Tear Solution (TEARS RENEWED OP) Apply 1 drop to eye daily as needed (dry eyes).     carvedilol (COREG)  6.25 MG tablet TAKE 1 TABLET BY MOUTH TWICE DAILY WITH A MEAL. MAY TAKE AN EXTRA TABLET DAILY IF HAVING PALPATIONS (Patient taking differently: Take 6.25 mg by mouth 2 (two) times daily with a meal.) 225 tablet 3   colchicine 0.6 MG tablet Take by mouth.     cyanocobalamin (,VITAMIN B-12,) 1000 MCG/ML injection 1000 mcg injection once per month. Please provide syringes as well 1 mL 15   diclofenac sodium (VOLTAREN) 1 % GEL Apply 2 g topically 4 (four) times daily as needed (pain).     furosemide (LASIX) 80 MG tablet TAKE 1 TABLET(80 MG) BY MOUTH ONCE A DAY , MAY TAKE AN ADDITIONAL 1 TABLET IF NEEDED FOR SWELLING 180 tablet 3   hyoscyamine (ANASPAZ) 0.125 MG TBDP disintergrating tablet Place 1 tablet (0.125 mg total) under the tongue every 6 (six) hours as needed for cramping. 30 tablet 5   indapamide (LOZOL) 2.5 MG tablet TAKE ONE TABLET BY MOUTH DAILY, 30 MINUTES BEFORE DAILY FUROSEMIDE. 90 tablet 2   isosorbide mononitrate (IMDUR) 30 MG 24 hr tablet TAKE 1 TABLET BY MOUTH EVERY DAY 90 tablet 3   lidocaine (XYLOCAINE) 5 % ointment Apply 1 application. topically as needed. 35.44 g 0   losartan (COZAAR) 50 MG tablet TAKE 1 TABLET BY MOUTH EVERY DAY 90 tablet 3   meclizine (ANTIVERT) 12.5 MG tablet TAKE 1 TABLET BY MOUTH 3 TIMES A DAY AS NEEDED FOR DIZZINESS 30 tablet 1   nitroGLYCERIN (NITROSTAT) 0.4 MG SL tablet DISSOLVE 1 TABLET UNDER THE TONGUE EVERY 5 MINUTES AS NEEDED FOR CHEST PAIN 25 tablet 4   spironolactone (ALDACTONE) 25 MG tablet TAKE 1 TABLET BY MOUTH AS DIRECTED. TAKE 1 TABLET ON MONDAYS, WEDNESDAYS, AND FRIDAYS AT 6 PM. 45 tablet 3   Zoster Vaccine Adjuvanted [facility]) injection Inject into the muscle. 1 each 1   allopurinol (ZYLOPRIM) 100 MG tablet Take 2 tablets (200 mg total) by mouth daily. 180 tablet 3   HYDROcodone-acetaminophen (NORCO/VICODIN) 5-325 MG tablet Take 1 tablet by mouth every 6 (six) hours as needed for moderate pain or severe pain (do not take tylenol with this. do  not drive for 8 hours after taking.). 20 tablet 0   No current facility-administered medications for this visit.     Objective:  BP 110/60   Pulse 85   Temp (!) 97.3 F (36.3 C) (Temporal)   Ht 5' 8"$  (1.727 m)   Wt 200 lb 9.6 oz (91 kg)   SpO2 93%   BMI 30.50 kg/m  Gen: NAD, resting comfortably CV: RRR no murmurs rubs or gallops Lungs: CTAB no crackles, wheeze, rhonchi Abdomen: soft/has epigastric tenderness  slightly better than usual/nondistended/normal bowel sounds. No rebound or guarding.  Ext: trace edema Skin: warm, dry     Assessment and Plan   #Cramping- notes in her fingers and upper legs- gets charlie horses, occasionally toes draw up. Mustard helps short term. Always had cramps but worse since Christmas.   #Chronic abdominal pain-see listing under epigastric abdominal pain  - thankfully has been somewhat better lately With intense abdominal pain takes hydrocodone- thankfully not needing as much but has run out - pain about 2-3/10   #CAD with angina- on imdur- despite PCI x2 (last 2011) #PAD- renal artery stenosis, mesenteric artery stenosis. Notes state mesenteric/renal stent before 2020.  #hyperlipidemia but with myalgias related to statins #Mobitz type 2 second degree AV block s/p pacemaker November 2022 S: Medication: imdur 30 mg, coreg 6.25 mg BID, on eliquis 5 mg with plavix discontinued - no cholesterol treatment as of 2023-statin intolerant and failed other lipid management - has not tried pcsk9 inhibitors  - occasional mild chest pain - resolves within 15 minutes.  Lab Results  Component Value Date   CHOL 229 (H) 01/10/2022   HDL 36.90 (L) 01/10/2022   LDLCALC 152 (H) 09/08/2019   LDLDIRECT 147.0 01/10/2022   TRIG 327.0 (H) 01/10/2022   CHOLHDL 6 01/10/2022   A/P: CAD with stable angina- continue current medications  Lipids- declines statins  Heart block with pacemaker in place PAD- has been stable- wish we could get her on statin but will  continue current meds  # Atrial fibrillation- discovered by pacemaker interrogation (very symptomatic) S: Rate controlled on coreg 6.25 mg BID (extra tablet with palpitations) - on amiodarone 400 mg with fast HR, additional 200 12 hours later if persists per rx Anticoagulated with eliquis 5 mg BID A/P: appropriately anticoagulated and rate controlled- continue current medicine  # Diabetes S: Medication:Diet controlled Lab Results  Component Value Date   HGBA1C 6.4 (H) 04/29/2022   HGBA1C 5.9 01/10/2022   HGBA1C 6.0 09/01/2007  A/P: hopefully stable- update a1c today. Continue without meds for now -she is interested in ozempic if sugars go up    #CHF diastolic, NYHA class III -also with venous stasis edema- wears compression #hypertension S: Medication:  lasix 80 mg (BID if needed for edema), indapaminde 2.5 mg, imdur 30 mg, losartan 50 mg, spironolactone 25 mg, carvedilol 6.25 mg twice daily  Edema: no increases Weight gain:down 1 lb through holiday Shortness of breath:  baseline is exertional dyspnea if overdose it A/P: CHF euvolemic- continue current medications  Blood pressure- stable- continue current medicines    # B12 deficiency S: Current treatment/medication (oral vs. IM): injections once a month  Lab Results  Component Value Date   [id number] 01/10/2022  A/P: occasional forgets- encouraged consistency   # Osteoporosis S: Last DEXA: Confirmed osteoporosis 01/22/2022 with worst T score -2.9 Medication (bisphosphonate or prolia): Fosamax started 02/04/2022 but has missed some doses  Calcium: 1274m (through diet ok) recommended - misses occasional Vitamin D: 1000 units a day recommended- bought 2000 units at [employer] will start  A/P: hopefully tsable- missing some doses- encouraged her to restart   #Gout S: Medication: Allopurinol 100 mg daily started after uric acid of 9.9 was noted- was 7 on once daily and recommended increase but she didn't get message  -Has  colchicine on hand -Does not tolerate prednisone well -Initially noted June 2023 with right index finger swollen Lab Results  Component Value Date   LABURIC 7.0 07/22/2022  A/P:poor control-  had flare a week or two ago- increase to 210m a day and recheck next visit    Recommended follow up: Return in about 14 weeks (around 01/23/2023) for followup or sooner if needed.Schedule b4 you leave. Future Appointments  Date Time Provider [place] 10/28/2022 11:40 AM Croitoru, MDani Gobble MD [facility] None  01/10/2023  7:00 AM [facility] DEVICE REMOTES [facility]  02/19/2023 11:00 AM HLeonie Man MD [facility] None  03/10/2023 10:30 AM [facility] HEALTH COACH [facility]  04/11/2023  7:00 AM [facility] DEVICE REMOTES [facility]  07/11/2023  7:00 AM [facility] DEVICE REMOTES [facility]  10/10/2023  7:00 AM [facility] DEVICE REMOTES [facility]  01/09/2024  7:00 AM [facility] DEVICE REMOTES [facility]   Lab/Order associations:   ICD-10-CM   1. Type 2 diabetes mellitus with peripheral neuropathy (HCC)  E11.42 CBC with Differential/Platelet    Comprehensive metabolic panel    Hemoglobin A1c    2. Leg cramping  R25.2 Magnesium    3. Congestive heart failure with LV diastolic dysfunction, NYHA class 2 (HCC)  I50.30     4. Coronary artery disease involving native coronary artery of native heart with angina pectoris ([place]  I25.119     5. PAD (peripheral artery disease) (HCC)  I73.9     6. Essential hypertension  I10     7. Hyperlipidemia associated with type 2 diabetes mellitus (HCC)  E11.69    E78.5      Meds ordered this encounter  Medications   HYDROcodone-acetaminophen (NORCO/VICODIN) 5-325 MG tablet    Sig: Take 1 tablet by mouth every 6 (six) hours as needed for moderate pain or severe pain (do not take tylenol with this. do not drive for 8 hours after taking.).    Dispense:  20 tablet    Refill:  0    allopurinol (ZYLOPRIM) 100 MG tablet    Sig: Take 2 tablets (200 mg total) by mouth daily.    Dispense:  180 tablet    Refill:  3    Return precautions advised.  SGarret Reddish MD

## 2022-10-17 NOTE — Patient Instructions (Addendum)
Consider Tdap at pharmacy  Sign release of information at the check out desk for last diabetic eye exam  Please stop by lab before you go If you have mychart- we will send your results within 3 business days of [place] receiving them.  If you do not have mychart- we will call you about results within 5 business days of [place] receiving them.  *please also note that you will see labs on mychart as soon as they post. I will later go in and write notes on them- will say "notes from Dr. Yong Channel"    Recommended follow up: Return in about 14 weeks (around 01/23/2023) for followup or sooner if needed.Schedule b4 you leave.

## 2022-10-18 LAB — CBC WITH DIFFERENTIAL/PLATELET
Basophils Absolute: 0.1 10*3/uL (ref 0.0–0.1)
Basophils Relative: 0.9 % (ref 0.0–3.0)
Eosinophils Absolute: 0.2 10*3/uL (ref 0.0–0.7)
Eosinophils Relative: 2.6 % (ref 0.0–5.0)
HCT: 34.8 % — ABNORMAL LOW (ref 36.0–46.0)
Hemoglobin: 11.9 g/dL — ABNORMAL LOW (ref 12.0–15.0)
Lymphocytes Relative: 16.8 % (ref 12.0–46.0)
Lymphs Abs: 1.6 10*3/uL (ref 0.7–4.0)
MCHC: 34.1 g/dL (ref 30.0–36.0)
MCV: 88 fl (ref 78.0–100.0)
Monocytes Absolute: 1 10*3/uL (ref 0.1–1.0)
Monocytes Relative: 10.7 % (ref 3.0–12.0)
Neutro Abs: 6.5 10*3/uL (ref 1.4–7.7)
Neutrophils Relative %: 69 % (ref 43.0–77.0)
Platelets: 292 10*3/uL (ref 150.0–400.0)
RBC: 3.96 Mil/uL (ref 3.87–5.11)
RDW: 14.2 % (ref 11.5–15.5)
WBC: 9.4 10*3/uL (ref 4.0–10.5)

## 2022-10-18 LAB — COMPREHENSIVE METABOLIC PANEL
ALT: 7 U/L (ref 0–35)
AST: 9 U/L (ref 0–37)
Albumin: 4.2 g/dL (ref 3.5–5.2)
Alkaline Phosphatase: 78 U/L (ref 39–117)
BUN: 38 mg/dL — ABNORMAL HIGH (ref 6–23)
CO2: 32 mEq/L (ref 19–32)
Calcium: 9.7 mg/dL (ref 8.4–10.5)
Chloride: 96 mEq/L (ref 96–112)
Creatinine, Ser: 1.02 mg/dL (ref 0.40–1.20)
GFR: 51.94 mL/min — ABNORMAL LOW (ref >60.00)
Glucose, Bld: 103 mg/dL — ABNORMAL HIGH (ref 70–99)
Potassium: 4.3 mEq/L (ref 3.5–5.1)
Sodium: 139 mEq/L (ref 135–145)
Total Bilirubin: 0.5 mg/dL (ref 0.2–1.2)
Total Protein: 7 g/dL (ref 6.0–8.3)

## 2022-10-18 LAB — HEMOGLOBIN A1C: Hgb A1c MFr Bld: 6.5 % (ref 4.6–6.5)

## 2022-10-18 LAB — MAGNESIUM: Magnesium: 2.3 mg/dL (ref 1.5–2.5)

## 2022-10-22 ENCOUNTER — Telehealth: Payer: Self-pay | Admitting: Cardiology

## 2022-10-22 NOTE — Telephone Encounter (Signed)
Called  spoke to patient .   Patient wanted to discus an appointment. Verbalized understanding

## 2022-10-22 NOTE — Telephone Encounter (Signed)
Patient would like a call back directly from [employer].

## 2022-10-28 ENCOUNTER — Ambulatory Visit: Payer: Medicare Other | Attending: Cardiovascular Disease | Admitting: Cardiovascular Disease

## 2022-10-28 ENCOUNTER — Encounter: Payer: Self-pay | Admitting: Cardiovascular Disease

## 2022-10-28 VITALS — BP 144/58 | HR 72 | Ht 68.0 in | Wt 200.6 lb

## 2022-10-28 DIAGNOSIS — Z950 Presence of cardiac pacemaker: Secondary | ICD-10-CM

## 2022-10-28 DIAGNOSIS — I25118 Atherosclerotic heart disease of native coronary artery with other forms of angina pectoris: Secondary | ICD-10-CM

## 2022-10-28 DIAGNOSIS — I480 Paroxysmal atrial fibrillation: Secondary | ICD-10-CM | POA: Diagnosis not present

## 2022-10-28 DIAGNOSIS — E1142 Type 2 diabetes mellitus with diabetic polyneuropathy: Secondary | ICD-10-CM

## 2022-10-28 DIAGNOSIS — I4719 Other supraventricular tachycardia: Secondary | ICD-10-CM

## 2022-10-28 DIAGNOSIS — I701 Atherosclerosis of renal artery: Secondary | ICD-10-CM | POA: Diagnosis not present

## 2022-10-28 DIAGNOSIS — D6869 Other thrombophilia: Secondary | ICD-10-CM

## 2022-10-28 DIAGNOSIS — I2109 ST elevation (STEMI) myocardial infarction involving other coronary artery of anterior wall: Secondary | ICD-10-CM

## 2022-10-28 DIAGNOSIS — I441 Atrioventricular block, second degree: Secondary | ICD-10-CM | POA: Diagnosis not present

## 2022-10-28 DIAGNOSIS — I10 Essential (primary) hypertension: Secondary | ICD-10-CM

## 2022-10-28 MED ORDER — APIXABAN 5 MG PO TABS: 5.0000 mg | ORAL_TABLET | Freq: Two times a day (BID) | ORAL | 3 refills | Status: DC

## 2022-10-28 NOTE — Patient Instructions (Signed)
Medication Instructions:  No changes *If you need a refill on your cardiac medications before your next appointment, please call your pharmacy*  Follow-Up: At [facility], you and your health needs are our priority.  As part of our continuing mission to provide you with exceptional heart care, we have created designated Provider Care Teams.  These Care Teams include your primary Cardiologist (physician) and Advanced Practice Providers (APPs -  Physician Assistants and Nurse Practitioners) who all work together to provide you with the care you need, when you need it.  We recommend signing up for the patient portal called "MyChart".  Sign up information is provided on this After Visit Summary.  MyChart is used to connect with patients for Virtual Visits (Telemedicine).  Patients are able to view lab/test results, encounter notes, upcoming appointments, etc.  Non-urgent messages can be sent to your provider as well.   To learn more about what you can do with MyChart, go to [email or web address].    Your next appointment:   1 year(s)- Device  Provider:   Dr Sallyanne Kuster

## 2022-10-28 NOTE — Progress Notes (Signed)
Cardiology Office Note:    Date:  11/03/2022   ID:  [name], DOB 11/27/1940, MRN [id number]  PCP:  Marin Olp, MD   [facility] Providers Cardiologist:  Glenetta Hew, MD Electrophysiologist:  Sanda Klein, MD     Referring MD: Marin Olp, MD   Chief Complaint  Patient presents with   Pacemaker Check    History of Present Illness:    [name] is a 81 y.o. female with a hx of CAD s/p remote PCI (BMS RCA, overlapping BMS and DES mid LAD), chronic heart failure with preserved left ventricular ejection fraction, hyperlipidemia, type 2 diabetes mellitus, paroxysmal atrial tachycardia as well as intermittent episodes of second-degree AV block Mobitz type II for which she underwent implantation of a dual-chamber permanent pacemaker on July 09, 2021.  The pacemaker procedure (implanted in left subclavian area), was complicated by pleuritic chest pain and the chest x-ray showed a very small right-sided pneumothorax.  The suspicion was that she has an atrial lead perforation that actually tracked through the pericardium to the right pleura.  She did not require chest tube placement, it resolved spontaneously.  Subsequent pacemaker monitoring has shown repeated episodes of mildly symptomatic paroxysmal atrial fibrillation lasting for several hours at a time.  Doing well today.  Has not had dizziness recently.  Has not been troubled by palpitations.  Definitely has not had syncope.  Denies chest pain or shortness of breath with her act usual activity.  As before, she has slightly high systolic blood pressure and a slightly low diastolic blood pressure.  Broad pulse pressure is likely secondary to age-related arterial compliance issues.  Pacemaker interrogation shows normal device function.  She only requires 6% atrial pacing and does not need to ventricular pacing.  She has had very little atrial fibrillation.  She had 1/32 episode last September but none since then.   She has occasional nonsustained paroxysmal atrial tachycardia with 1: 1 AV conduction lasting for 1 or 2 seconds.     Past Medical History:  Diagnosis Date   Adrenal adenoma    Arthritis    "fingers, right shoulder" (09/09/2017)   Atrial fibrillation ([place]) 12/2021   New diagnosis noted on pacemaker interrogation.   Back pain with radiation    Chronic Back Pain - mutliple surgeries (including tumor removal)   Bilateral edema of lower extremity    Chronic, likely related to venous stasis   Bradycardia    Pacemaker placed   CAD S/P percutaneous coronary angioplasty    a) LHC: 07/30/10. -- 3.0x43m Integrity BMS to pRCA & 2.5x 123mIntegrity BMS mLAD(@ D2).  b) Class III-IV Angina 03/2011: LHC- ISR in LAD BMS -- prox overlapping Promus DES 2.5x1668mnd PTCA of jailed D2 ostium-prox 80%. c) 02/03/12:  LHC- patent stents.  Jailed diagonal. with stable flow; d) Peri-OP NSTEMI 04/2012 - LHC in 12/'13 -    Celiac artery stenosis ([place]  12/2016 - STENT placement   Complication of anesthesia    "used to wake up wild years ago" (09/09/2017)   Congestive heart failure with LV diastolic dysfunction, NYHA class 2 ([place]  06/13/10:  last 2D echo-  EF >55%, Mild TR, Mod Conc LVH - Grade 1 diastolic dysfunction (abnormal relaxation) --> LVEDP on Cath 28 mmHg & mild 2nd Pulm HTN   Diet-controlled type 2 diabetes mellitus ([place]  Diverticulitis of colon (without mention of hemorrhage)(562.11)    Diverticulosis    Dyslipidemia, goal  LDL below 70    Intolerant to statins   GERD (gastroesophageal reflux disease)    Hepatitis ~ 1957   "yellow jaundice" (01/14/2017)   Hepatomegaly    Hiatal hernia    History of blood transfusion 04/2012   "when I had a heart attack"   History of kidney stones    "I've got a stone embedded in one of my kidneys" (09/09/2017)   History of stomach ulcers "years ago"   Irritable bowel syndrome (IBS)    Labile essential hypertension    Partially related to RAS   Mesenteric  artery stenosis (HCC)    95% Celiac Artery - ostial, 20-30% SMA.  Bilateral Renal A: L RA stent patent, R RA 20-30% -- Conservative Management   Mobitz type 2 second degree AV block    Status post PPM placement   NSTEMI (non-ST elevated myocardial infarction) ([place]) 04/2012   Unclear the details, apparently this was postoperative from her back surgery that she was cleared for my last saw her in June.  Reportedly had stents placed   Pancreas divisum    On pancreatic enzyme   PAT (paroxysmal atrial tachycardia)    Renal artery stenosis ([place]) 2011; 12/'13   a) Angiogram 02/03/12:  50-60%L RA stenosis, 40% R R Inferior artery; b) 12/'13: S/P L RA Stent ([facility]) 6.0 mm x 15 mm; c) Renal Duplex 10/2013: <60% L RA, <60 R RA, ~60% SMA & Celiac A.   S/P placement of cardiac pacemaker 07/2021   Medtronic   Stricture and stenosis of esophagus    Thyroid nodule    Unspecified gastritis and gastroduodenitis without mention of hemorrhage     Past Surgical History:  Procedure Laterality Date   APPENDECTOMY     BACK SURGERY     BREAST DUCTAL SYSTEM EXCISION     right   BREAST SURGERY Right    CARDIAC CATHETERIZATION N/A 03/16/2015   Procedure: Left Heart Cath and Coronary Angiography;  Surgeon: Leonie Man, MD;  Location: [place] [facility];  Service: Cardiovascular; Widely patent m-dLAD stents as wellas pRCA stent.  High LVEDP, small Diag & Om vessels with moderate stenosis    Cardiac Event Monitor  01/2017   Mostly NSR with occasional sinus tachycardia and rare bradycardia. No A. fib. No PND or PSVT. Rare PACs and PVCs.   CATARACT EXTRACTION W/ INTRAOCULAR LENS  IMPLANT, BILATERAL Bilateral    CHOLECYSTECTOMY OPEN     COLONOSCOPY  03/01/2009   normal    CORONARY ANGIOPLASTY WITH STENT PLACEMENT  07/30/2010   3.0x45m Integrity BMS to RCA and 2.5x 145mIntegrity BMS LAD.     CORONARY ANGIOPLASTY WITH STENT PLACEMENT  03/18/2011   Cutting Balloon PTCA of D2 (jailed - 80% ostial stenosis),  DES PCI of mid LAD ISR - > Promus DES 2.5 x 16 mm postdilated to 2.8 mm) covering the proximal portion of the previous stent   DOBUTAMINE STRESS ECHO  03/07/2015   [facility] (ordered for pre-op evaluation for EUS/ERCP --> abnormal EKG:  strreesss test shhoowwed 1 mm ST segment depressions downsloping. No wall motion abnormalities at peak exercise or at rest. Diastolic dysfunction was noted but normal systolic function - EF greater than 55%. No bouts of regurgitation or stenosis. Resting hypertension with exaggerated response   ESOPHAGOGASTRODUODENOSCOPY  04/08/2012   ESOPHAGOGASTRODUODENOSCOPY (EGD) WITH ESOPHAGEAL DILATION  X 2   FRACTURE SURGERY     HAMMER TOE SURGERY Bilateral    "took bone off the top of  2nd toe on each foot"   HIP SURGERY Left    "something to do w/my back"   JOINT REPLACEMENT     KNEE SURGERY Left    "had fluid drained off it a couple times"   LEFT HEART CATH AND CORONARY ANGIOGRAPHY  07/30/2010   severe LAD-diagonal 80%, moderate to severe proximal RCA.  Mean PAP 15 mmHg.  PCWP mean 17 mmHg.  RVP 45/11 mmHg.  PAP 47/24 mmHg, mean 31 mmHg.  LVEDP 27 mmHg   LEFT HEART CATHETERIZATION WITH CORONARY ANGIOGRAM N/A 02/03/2012   Procedure: LEFT HEART CATHETERIZATION WITH CORONARY ANGIOGRAM;  Surgeon: Leonie Man, MD;  Location: [facility] LAB; widely patent LAD and RCA stents.  Patent D2 ostium.  Moderate L Renal A stenosis (56%), R Renal A 40-50% t.  LVEDP 20 mmHg.   LEFT HEART CATHETERIZATION WITH CORONARY ANGIOGRAM N/A 05/12/2014   Procedure: LEFT HEART CATHETERIZATION WITH CORONARY ANGIOGRAM;  Surgeon: Lorretta Harp, MD;  Location: [facility] CATH LAB;  Service: Cardiovascular: Stable CAD. Patent stents. Patent renal artery stent   LEFT HEART CATHETERIZATION WITH CORONARY ANGIOGRAM  08/2012   Peri-Op MI @ [facility] -- 40% ostial D1, patent LAD stents, 10% RCA ISR   LEFT HEART CATHETERIZATION WITH CORONARY ANGIOGRAM   03/18/2011   70% ISR of LAD stent just after D2 (D2 has  ostial 80 to 90% stenosis.  20 to 30% ISR RCA.  EDP elevated at 28 mmHg   NM MYOVIEW LTD  12/2016   LOW RISK study. No ischemia or infarction. EF 65-75%.   NM MYOVIEW LTD  01/05/2020    LOW RISK. EF 60-65%.  No EKG changes.  No infarct, no ischemia.   PACEMAKER IMPLANT N/A 07/09/2021   Procedure: PACEMAKER IMPLANT;  Surgeon: Sanda Klein, MD;  Location: [place] [facility];  Service: Cardiovascular;  Laterality: N/A;   PANCREAS SURGERY     "stent in my pancreas"; Dr Pershing Proud   PERCUTANEOUS PINNING PHALANX FRACTURE OF HAND Left ~ 2013   PERIPHERAL VASCULAR BALLOON ANGIOPLASTY  09/09/2017   Procedure: PERIPHERAL VASCULAR BALLOON ANGIOPLASTY;  Surgeon: Serafina Mitchell, MD;  Location: [place] [facility];  Service: Cardiovascular;;  Celiac instent   PERIPHERAL VASCULAR INTERVENTION  01/14/2017   Procedure: Peripheral Vascular Intervention;  Surgeon: Serafina Mitchell, MD;  Location: [place] [facility];  Service: Cardiovascular;;  mesentric   RENAL ARTERY STENT Left 08/2012   @ [facility] - 6.0 mm x 15 mm   SPINE SURGERY     tumor removed 07/2010; Redo Surgery 04/2012; Sacroiliac Sgx 08/2013   TOTAL ABDOMINAL HYSTERECTOMY     TOTAL KNEE ARTHROPLASTY Left ~ 2012   TRANSTHORACIC ECHOCARDIOGRAM  07/10/2021   (Postop PPM): Normal LV size and function.  EF 60 to 65%.  No RWMA.  GR 1 DD.  Mild LA dilation.  Normal RV size and function.  Normal RAP.  Normal aortic and mitral valves.  No pericardial effusion.   VISCERAL ANGIOGRAM N/A 05/12/2014   Procedure: VISCERAL ANGIOGRAM;  Surgeon: Lorretta Harp, MD;  Location: [facility] CATH LAB;  25% ostial proximal celiac artery with downward takeoff.  23% proximal SMA and 56% proximal IMA.  Left renal artery stent widely patent.  Right renal artery is 20 to 30% proximal stenosis.   VISCERAL ANGIOGRAPHY N/A 01/14/2017   Procedure: Mesenteric  Angiography;  Surgeon: Serafina Mitchell, MD;  Location: [place] [facility];  Service: Cardiovascular;   Laterality: N/A;  VISCERAL ANGIOGRAPHY N/A 09/09/2017   Procedure: VISCERAL ANGIOGRAPHY;  Surgeon: Serafina Mitchell, MD;  Location: [place] [facility];  Service: Cardiovascular;  Laterality: N/A;    Current Medications: Current Meds  Medication Sig   alendronate (FOSAMAX) 70 MG tablet Take 1 tablet (70 mg total) by mouth every 7 (seven) days. Take with a full glass of water on an empty stomach.   allopurinol (ZYLOPRIM) 100 MG tablet Take 2 tablets (200 mg total) by mouth daily.   amiodarone (PACERONE) 200 MG tablet TAKE 400 MG (2 TABLETS) ONE DOSE AS NEED FOR A FAST HEART RATE ,IF AFTER 12 HOURS STILL PRESENT TAKE 200 MG ( 1 TABLET) ONE DOSE FOR EACH EPISODE   Artificial Tear Solution (TEARS RENEWED OP) Apply 1 drop to eye daily as needed (dry eyes).   carvedilol (COREG) 6.25 MG tablet TAKE 1 TABLET BY MOUTH TWICE DAILY WITH A MEAL. MAY TAKE AN EXTRA TABLET DAILY IF HAVING PALPATIONS (Patient taking differently: Take 6.25 mg by mouth 2 (two) times daily with a meal.)   colchicine 0.6 MG tablet Take by mouth.   cyanocobalamin (,VITAMIN B-12,) 1000 MCG/ML injection 1000 mcg injection once per month. Please provide syringes as well   diclofenac sodium (VOLTAREN) 1 % GEL Apply 2 g topically 4 (four) times daily as needed (pain).   furosemide (LASIX) 80 MG tablet Take 80 mg by mouth 2 (two) times daily.   HYDROcodone-acetaminophen (NORCO/VICODIN) 5-325 MG tablet Take 1 tablet by mouth every 6 (six) hours as needed for moderate pain or severe pain (do not take tylenol with this. do not drive for 8 hours after taking.).   hyoscyamine (ANASPAZ) 0.125 MG TBDP disintergrating tablet Place 1 tablet (0.125 mg total) under the tongue every 6 (six) hours as needed for cramping.   indapamide (LOZOL) 2.5 MG tablet TAKE ONE TABLET BY MOUTH DAILY, 30 MINUTES BEFORE DAILY FUROSEMIDE.   isosorbide mononitrate (IMDUR) 30 MG 24 hr tablet TAKE 1 TABLET BY MOUTH EVERY DAY   lidocaine (XYLOCAINE) 5 % ointment Apply 1  application. topically as needed.   losartan (COZAAR) 50 MG tablet TAKE 1 TABLET BY MOUTH EVERY DAY   meclizine (ANTIVERT) 12.5 MG tablet TAKE 1 TABLET BY MOUTH 3 TIMES A DAY AS NEEDED FOR DIZZINESS   spironolactone (ALDACTONE) 25 MG tablet TAKE 1 TABLET BY MOUTH AS DIRECTED. TAKE 1 TABLET ON MONDAYS, WEDNESDAYS, AND FRIDAYS AT 6 PM.   Zoster Vaccine Adjuvanted [facility]) injection Inject into the muscle.   [DISCONTINUED] apixaban (ELIQUIS) 5 MG TABS tablet Take 1 tablet (5 mg total) by mouth 2 (two) times daily.     Allergies:   Codeine phosphate, Contrast media [iodinated contrast media], Penicillin g, Iodine, Prednisone, Statins, and Tramadol   Social History   Socioeconomic History   Marital status: Married    Spouse name: Not on file   Number of children: 1   Years of education: Not on file   Highest education level: Not on file  Occupational History   Occupation: gift shop owner    Employer: [employer]    Comment: She takes cakes and pies to sell, also canned vegetables and makes Jeneen Rinks enjoys.  Tobacco Use   Smoking status: Never   Smokeless tobacco: Never  Vaping Use   Vaping Use: Never used  Substance and Sexual Activity   Alcohol use: No   Drug use: No   Sexual activity: Not Currently  Other Topics Concern   Not on file  Social History Narrative   Married 28 years in 2023. 1 child Conne in [place].  1 grandchild in [place].       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       Hobbies: cooking/baking, loves to feed people   Social Determinants of Health   Financial Resource Strain: Low Risk  (02/26/2022)   Overall Financial Resource Strain (CARDIA)    Difficulty of Paying Living Expenses: Not hard at all  Food Insecurity: No Food Insecurity (02/26/2022)   Hunger Vital Sign    Worried About  Running Out of Food in the Last Year: Never true    Ran Out of Food in the Last Year: Never true  Transportation Needs: No Transportation Needs (02/26/2022)   PRAPARE - Hydrologist (Medical): No    Lack of Transportation (Non-Medical): No  Physical Activity: Inactive (02/26/2022)   Exercise Vital Sign    Days of Exercise per Week: 0 days    Minutes of Exercise per Session: 0 min  Stress: No Stress Concern Present (02/26/2022)   [place]    Feeling of Stress : Not at all  Social Connections: Moderately Integrated (02/26/2022)   Social Connection and Isolation Panel [NHANES]    Frequency of Communication with Friends and Family: More than three times a week    Frequency of Social Gatherings with Friends and Family: More than three times a week    Attends Religious Services: More than 4 times per year    Active Member of [employer] or Organizations: No    Attends Music therapist: Never    Marital Status: Married     Family History: The patient's family history includes Cancer in her daughter and father; Colitis in her maternal grandfather; Diabetes in her brother; Heart attack in her brother, brother, and father; Heart disease in her father; Stroke in her mother. There is no history of Colon cancer or Stomach cancer.  ROS:   Please see the history of present illness.     All other systems reviewed and are negative.  EKGs/Labs/Other Studies Reviewed:    The following studies were reviewed today: Comprehensive pacemaker check in the office today.  EKG:   ECG was not checked today.  Cardiac electrogram shows atrial sensed, ventricular sensed rhythm (normal sinus rhythm).  Recent Labs: 04/29/2022: TSH 3.050 10/17/2022: ALT 7; BUN 38; Creatinine, Ser 1.02; Hemoglobin 11.9; Magnesium 2.3; Platelets 292.0; Potassium 4.3; Sodium 139  Recent Lipid Panel    Component Value Date/Time    CHOL 229 (H) 01/10/2022 1014   CHOL 245 (H) 09/08/2019 1045   TRIG 327.0 (H) 01/10/2022 1014   HDL 36.90 (L) 01/10/2022 1014   HDL 36 (L) 09/08/2019 1045   CHOLHDL 6 01/10/2022 1014   VLDL 65.4 (H) 01/10/2022 1014   LDLCALC 152 (H) 09/08/2019 1045   LDLDIRECT 147.0 01/10/2022 1014     Risk Assessment/Calculations:    CHA2DS2-VASc Score =     This indicates a  % annual risk of stroke. The patient's score is based upon:            Physical Exam:    VS:  BP (!) 144/58 (BP Location: Left Arm, Patient Position: Sitting, Cuff Size: Large)   Pulse 72   Ht '5\' 8"'$  (1.727  m)   Wt 200 lb 9.6 oz (91 kg)   SpO2 95%   BMI 30.50 kg/m     Wt Readings from Last 3 Encounters:  10/28/22 200 lb 9.6 oz (91 kg)  10/17/22 200 lb 9.6 oz (91 kg)  09/18/22 197 lb 3.2 oz (89.4 kg)      General: Alert, oriented x3, no distress, healthy left subclavian pacemaker site. Head: no evidence of trauma, PERRL, EOMI, no exophtalmos or lid lag, no myxedema, no xanthelasma; normal ears, nose and oropharynx Neck: normal jugular venous pulsations and no hepatojugular reflux; brisk carotid pulses without delay and no carotid bruits Chest: clear to auscultation, no signs of consolidation by percussion or palpation, normal fremitus, symmetrical and full respiratory excursions Cardiovascular: normal position and quality of the apical impulse, regular rhythm, normal first and second heart sounds, no murmurs, rubs or gallops Abdomen: no tenderness or distention, no masses by palpation, no abnormal pulsatility or arterial bruits, normal bowel sounds, no hepatosplenomegaly Extremities: no clubbing, cyanosis or edema; 2+ radial, ulnar and brachial pulses bilaterally; 2+ right femoral, posterior tibial and dorsalis pedis pulses; 2+ left femoral, posterior tibial and dorsalis pedis pulses; no subclavian or femoral bruits Neurological: grossly nonfocal Psych: Normal mood and affect    ASSESSMENT:    1. Paroxysmal  atrial fibrillation (HCC)   2. PAT (paroxysmal atrial tachycardia)   3. Acquired thrombophilia ([place])   4. Coronary artery disease of native artery of native heart with stable angina pectoris ([place])   5. Essential hypertension   6. Type 2 diabetes mellitus with peripheral neuropathy (HCC)   7. Mobitz type 2 second degree atrioventricular block   8. Pacemaker      PLAN:    In order of problems listed above:  AFib: Extremely low burden of arrhythmia.  None in the last 6 months.  She has had infrequent paroxysmal atrial fibrillation, usually lasting for a few minutes, occasionally lasting for a few hours with an overall burden around 1%.  CHA2DS2-VASc 6-7 (age 38, gender, HTN, CAD, diast HF, +/-prediabetes).   On Eliquis.  PAT: She has good ventricular rate control during atrial fibrillation, but has some rapid rates during ectopic atrial tachycardia with 1: 1 AV conduction, but recently these events have only lasted for a couple of seconds.  Reiterated my opinion that amiodarone is not a great medicine to be taken as "pill in the pocket due to its very slow and long action.   Anticoagulation: No bleeding complications. CAD: No recent complaints of chest pain.  On carvedilol and long-acting nitrates. HTN: Need to tolerate a slightly high systolic blood pressure due to her tendency to have low diastolic blood pressure, likely due to stiff arterial system. DM: Most recent hemoglobin A1c 6.5%, acceptable, but higher than in the past.  Watch out for excessive intake of sugars and starches. If she develops the need for medications for diabetes mellitus, SGLT2 inhibitors would be the first choice. 2nd deg AVB: Despite this, she never requires ventricular pacing. Pacemaker: Normal device function, continue remote downloads every 3 months. CHF: Clinically euvolemic.  Well compensated.  If she develops the need for medications for diabetes mellitus, SGLT2 inhibitors would be the first choice.           Medication Adjustments/Labs and Tests Ordered: Current medicines are reviewed at length with the patient today.  Concerns regarding medicines are outlined above.  Orders Placed This Encounter  Procedures   EKG 12-Lead   Meds ordered this encounter  Medications  apixaban (ELIQUIS) 5 MG TABS tablet    Sig: Take 1 tablet (5 mg total) by mouth 2 (two) times daily.    Dispense:  180 tablet    Refill:  3    90 day supply     Patient Instructions  Medication Instructions:  No changes *If you need a refill on your cardiac medications before your next appointment, please call your pharmacy*  Follow-Up: At [facility], you and your health needs are our priority.  As part of our continuing mission to provide you with exceptional heart care, we have created designated Provider Care Teams.  These Care Teams include your primary Cardiologist (physician) and Advanced Practice Providers (APPs -  Physician Assistants and Nurse Practitioners) who all work together to provide you with the care you need, when you need it.  We recommend signing up for the patient portal called "MyChart".  Sign up information is provided on this After Visit Summary.  MyChart is used to connect with patients for Virtual Visits (Telemedicine).  Patients are able to view lab/test results, encounter notes, upcoming appointments, etc.  Non-urgent messages can be sent to your provider as well.   To learn more about what you can do with MyChart, go to [email or web address].    Your next appointment:   1 year(s)- Device  Provider:   Dr Sallyanne Kuster    Signed, Sanda Klein, MD  11/03/2022 2:15 PM    [place]

## 2022-10-29 NOTE — Progress Notes (Signed)
Remote pacemaker transmission.   

## 2022-11-06 ENCOUNTER — Other Ambulatory Visit: Payer: Self-pay | Admitting: Cardiology

## 2022-11-11 ENCOUNTER — Telehealth: Payer: Self-pay | Admitting: Family Medicine

## 2022-11-11 NOTE — Telephone Encounter (Signed)
Patient Advised to go to ED but Patient refused   Patient Name: [name] [name] Gender: Female DOB: [birth year] Age: 82 Y 38 M 11 D Return Phone Number: [phone] (Primary), [phone] (Secondary) Address: City/ State/ Zip: [name] [address] Client [facility] [place] Client Site [facility] [place] Provider Garret Reddish- MD Contact Type Call Who Is Calling Patient / Member / Family / Caregiver Call Type Triage / Clinical Relationship To Patient Self Return Phone Number [phone] (Primary) Chief Complaint Leg Pain Reason for Call Symptomatic / Request for [place] states she is calling because she has been having cramping in her left leg for about a month, and last night she felt a sharp pain. She states she found a knot behind her left knee Translation No Nurse Assessment Nurse: Cherre Robins, RN, Ria Comment Date/Time (Eastern Time): 11/11/2022 1:54:40 PM Confirm and document reason for call. If symptomatic, describe symptoms. ---Caller states she's had cramping in her left leg for about a month and last night she felt a sharp pain. States there is now a knot behind her left knee. No fever. Does the patient have any new or worsening symptoms? ---Yes Will a triage be completed? ---Yes Related visit to physician within the last 2 weeks? ---No Does the PT have any chronic conditions? (i.e. diabetes, asthma, this includes High risk factors for pregnancy, etc.) ---Yes List chronic conditions. ---HTN, heart stents, "pancreas problems", borderline diabetes Is this a behavioral health or substance abuse call? ---No Guidelines Guideline Title Affirmed Question Affirmed Notes Nurse Date/Time Eilene Ghazi Time) Leg Pain Chest pain Weiss-Hilton, RN, Ria Comment 11/11/2022 2:01:24 PM  Disp. Time Eilene Ghazi Time) Disposition Final User 11/11/2022 2:04:20 PM Go to ED Now Yes Weiss-Hilton, RN,  Ria Comment Final Disposition 11/11/2022 2:04:20 PM Go to ED Now Yes Weiss-Hilton, RN, Ivonne Andrew Disagree/Comply Disagree Caller Understands Yes PreDisposition InappropriateToAsk Care Advice Given Per Guideline GO TO ED NOW: * You need to be seen in the Emergency Department. * Go to the ED at ___________ [facility] now. Drive carefully. NOTE TO TRIAGER - DRIVING: * Another adult should drive. * Patient should not delay going to the emergency department. * If immediate transportation is not available via car, rideshare (e.g., Lyft, Uber), or taxi, then the patient should be instructed to call [id number]. CARE ADVICE given per Leg Pain (Adult) guideline. Comments User: Carolan Clines, RN Date/Time Eilene Ghazi Time): 11/11/2022 1:56:43 PM States she also has CHF User: Carolan Clines, RN Date/Time Eilene Ghazi Time): 11/11/2022 1:57:50 PM States she has chest pain earlier this morning. States that's "not unusal" and has a pacemaker. User: Carolan Clines, RN Date/Time Eilene Ghazi Time): 11/11/2022 2:04:19 PM Caller states she is rx'd nitroglycerin for when chest pain "gets real bad". Stateslast had chest pain earlier this morning. User: Carolan Clines, RN Date/Time Eilene Ghazi Time): 11/11/2022 2:09:58 PM Caller stated she doesn't want to be seen in ED per final dispo and would rather take the appt she was offered for today before being transferred to a triage nurse. Per client directives for when caller refuses final dispo advice RN contacted the office and spoke with [place] who initially stated that aren't supposed to book pt appts if they have been advised to go to ED, but she will talk to the doctor about it and one of them will call the pt back directly. Referrals GO TO FACILITY REFUSED

## 2022-11-11 NOTE — Telephone Encounter (Signed)
Patient declines going to ED per Access nurse.   Awaiting full notes    Please advise

## 2022-11-11 NOTE — Telephone Encounter (Signed)
On Eliquis so unlikely to be a clot-can we work her in after our last patient on Tuesday?

## 2022-11-11 NOTE — Telephone Encounter (Signed)
Patient states she has been having cramping in her left leg since before 10/17/22.  States last night she felt a sharp pain in her left leg, went to rub it and felt a huge knot (approx. size of a golf ball) behind left knee.  Transferred to Triage Cristie Hem)

## 2022-11-12 ENCOUNTER — Encounter: Payer: Self-pay | Admitting: Family Medicine

## 2022-11-12 ENCOUNTER — Ambulatory Visit (INDEPENDENT_AMBULATORY_CARE_PROVIDER_SITE_OTHER): Payer: Medicare Other | Admitting: Family Medicine

## 2022-11-12 VITALS — BP 132/60 | HR 85 | Temp 97.8°F | Ht 68.0 in | Wt 200.0 lb

## 2022-11-12 DIAGNOSIS — M25562 Pain in left knee: Secondary | ICD-10-CM | POA: Diagnosis not present

## 2022-11-12 DIAGNOSIS — I480 Paroxysmal atrial fibrillation: Secondary | ICD-10-CM | POA: Diagnosis not present

## 2022-11-12 MED ORDER — HYOSCYAMINE SULFATE 0.125 MG PO TBDP: 0.1250 mg | ORAL_TABLET | Freq: Four times a day (QID) | ORAL | 5 refills | Status: DC | PRN

## 2022-11-12 NOTE — Progress Notes (Signed)
Phone [phone] In person visit   Subjective:   [name] is a 82 y.o. year old very pleasant female patient who presents for/with See problem oriented charting Chief Complaint  Patient presents with   knot behind left knee    Pt c/o know behind left knee that has been there for about a month along with cramping and pain    Past Medical History-  Patient Active Problem List   Diagnosis Date Noted   Osteoporosis 01/22/2022    Priority: High   Pacemaker 07/09/2021    Priority: High   Mobitz type 2 second degree atrioventricular block 06/20/2021    Priority: High   PAT (paroxysmal atrial tachycardia) 06/20/2021    Priority: High   PAF (paroxysmal atrial fibrillation) ([place]) 10/16/2020    Priority: High   PAD (peripheral artery disease) ([place]) 01/14/2017    Priority: High   Type 2 diabetes mellitus with peripheral neuropathy ([place]) 10/09/2016    Priority: High   Mesenteric artery stenosis ([place]) 04/20/2014    Priority: High   Abdominal pain, epigastric 01/19/2014    Priority: High   Congestive heart failure with LV diastolic dysfunction, NYHA class 2 ([place])     Priority: High   Celiac artery stenosis: 60% by Duplex - 90-95% by cath - Med Rx 11/27/2013    Priority: High   Renal artery stenosis ([place]) 08/29/2012    Priority: High   Coronary artery disease involving native coronary artery of native heart with angina pectoris ([facility]) 10/30/2011    Priority: High   CAD S/P percutaneous coronary angioplasty: pRCA BMS, mLAD BMS overlapped prox with DES for ISR 07/30/2010    Priority: High   B12 deficiency 01/10/2022    Priority: Medium    Continuous severe abdominal pain 10/11/2016    Priority: Medium    Exertional dyspnea 03/16/2015    Priority: Medium    Idiopathic chronic pancreatitis suspected 03/10/2014    Priority: Medium    Abnormal CT of liver-possible cirrhosis 03/10/2014    Priority: Medium    Stasis edema of bilateral lower extremity     Priority: Medium     Essential hypertension     Priority: Medium    Hyperlipidemia associated with type 2 diabetes mellitus ([place])     Priority: Medium    Myalgia due to statin 12/29/2011    Priority: Medium    GERD (gastroesophageal reflux disease) 01/24/2009    Priority: Medium    Hypothyroid 11/12/2007    Priority: Medium    Irritable bowel syndrome 11/12/2007    Priority: Medium    PANCREAS DIVISUM 11/12/2007    Priority: Medium    Allergic rhinitis 01/10/2022    Priority: Low   Pneumothorax 07/10/2021    Priority: Low   Syncope and collapse 06/20/2021    Priority: Low   Hallux valgus (acquired), right foot 08/17/2018    Priority: Low   Costochondritis 01/21/2017    Priority: Low   Onychomycosis due to dermatophyte 11/16/2015    Priority: Low   Abnormal electrocardiogram during exercise stress test 03/15/2015    Priority: Low   Dysphagia 03/10/2014    Priority: Low   Long term current use of antithrombotics/antiplatelets - clopidogrel 03/10/2014    Priority: Low   Carotid stenosis 02/27/2014    Priority: Low   Leg cramps 01/19/2014    Priority: Low   Palpitations 01/12/2014    Priority: Low   Diarrhea due to malabsorption 01/24/2009    Priority: Low   Renal calculus  11/12/2007    Priority: Low   HIATAL HERNIA 11/20/2005    Priority: Low    Medications- reviewed and updated Current Outpatient Medications  Medication Sig Dispense Refill   alendronate (FOSAMAX) 70 MG tablet Take 1 tablet (70 mg total) by mouth every 7 (seven) days. Take with a full glass of water on an empty stomach. 5 tablet 11   allopurinol (ZYLOPRIM) 100 MG tablet Take 2 tablets (200 mg total) by mouth daily. 180 tablet 3   amiodarone (PACERONE) 200 MG tablet TAKE 400 MG (2 TABLETS) ONE DOSE AS NEED FOR A FAST HEART RATE ,IF AFTER 12 HOURS STILL PRESENT TAKE 200 MG ( 1 TABLET) ONE DOSE FOR EACH EPISODE 180 tablet 1   apixaban (ELIQUIS) 5 MG TABS tablet Take 1 tablet (5 mg total) by mouth 2 (two) times daily. 180  tablet 3   Artificial Tear Solution (TEARS RENEWED OP) Apply 1 drop to eye daily as needed (dry eyes).     carvedilol (COREG) 6.25 MG tablet TAKE 1 TABLET BY MOUTH TWICE DAILY WITH A MEAL. MAY TAKE AN EXTRA TABLET DAILY IF HAVING PALPATIONS (Patient taking differently: Take 6.25 mg by mouth 2 (two) times daily with a meal.) 225 tablet 3   colchicine 0.6 MG tablet Take by mouth.     cyanocobalamin (,VITAMIN B-12,) 1000 MCG/ML injection 1000 mcg injection once per month. Please provide syringes as well 1 mL 15   diclofenac sodium (VOLTAREN) 1 % GEL Apply 2 g topically 4 (four) times daily as needed (pain).     furosemide (LASIX) 80 MG tablet Take 80 mg by mouth 2 (two) times daily.     indapamide (LOZOL) 2.5 MG tablet TAKE ONE TABLET BY MOUTH DAILY, 30 MINUTES BEFORE DAILY FUROSEMIDE. 90 tablet 2   isosorbide mononitrate (IMDUR) 30 MG 24 hr tablet TAKE 1 TABLET BY MOUTH EVERY DAY 90 tablet 3   lidocaine (XYLOCAINE) 5 % ointment Apply 1 application. topically as needed. 35.44 g 0   losartan (COZAAR) 50 MG tablet TAKE 1 TABLET BY MOUTH EVERY DAY 90 tablet 3   meclizine (ANTIVERT) 12.5 MG tablet TAKE 1 TABLET BY MOUTH 3 TIMES A DAY AS NEEDED FOR DIZZINESS 30 tablet 1   nitroGLYCERIN (NITROSTAT) 0.4 MG SL tablet DISSOLVE 1 TABLET UNDER THE TONGUE EVERY 5 MINUTES AS NEEDED FOR CHEST PAIN 25 tablet 4   spironolactone (ALDACTONE) 25 MG tablet TAKE 1 TABLET BY MOUTH AS DIRECTED. TAKE 1 TABLET ON MONDAYS, WEDNESDAYS, AND FRIDAYS AT 6 PM. 45 tablet 3   HYDROcodone-acetaminophen (NORCO/VICODIN) 5-325 MG tablet Take 1 tablet by mouth every 6 (six) hours as needed for moderate pain or severe pain (do not take tylenol with this. do not drive for 8 hours after taking.). (Patient not taking: Reported on 11/12/2022) 20 tablet 0   hyoscyamine (ANASPAZ) 0.125 MG TBDP disintergrating tablet Place 1 tablet (0.125 mg total) under the tongue every 6 (six) hours as needed (abdominal cramping). 30 tablet 5   No current  facility-administered medications for this visit.     Objective:  BP 132/60   Pulse 85   Temp 97.8 F (36.6 C)   Ht '5\' 8"'$  (1.727 m)   Wt 200 lb (90.7 kg)   SpO2 95%   BMI 30.41 kg/m  Gen: NAD, resting comfortably CV: RRR no murmurs rubs or gallops Lungs: CTAB no crackles, wheeze, rhonchi Ext: No calf pain or tenderness bilaterally.  Medial to antecubital fossa there is a firm at least 7-1/2 x  5 cm growth which is visible-somewhat tender to touch but no surrounding warmth, redness Skin: warm, dry     Assessment and Plan   # Knot behind left knee S: Patient noted a knot behind her left knee approximately on Sunday of this week.  Notes both cramping and pain in this area for several weeks prior to this.  She had a sharp pain on Sunday which led her to inspect the area and found a growth-no obvious further growth since that time.  Of note she is on Eliquis 5 mg twice daily chronically for atrial fibrillation A/P: Patient with a significant knot/bulge behind her left knee that seem to develop suddenly over the last few days-she typically follows with Dr. Rhona Raider [facility] and we considered referral there but I think I can get her in much more quickly with  sports medicine if she would like to have a faster answer - I told her highest on the differential was a Pileggi's cyst but that this was rather large and firm and developed rapidly and more medial than some I have noted in the past - I thought ultrasound would be very beneficial to further evaluate.  We discussed x-rays likely low yield and MRI of soft tissues would take some time so we thought this was the most helpful path - Not pulsatile and I doubt arterial.  Already on Eliquis so doubt venous/DVT - Advised her not to wear her hose for now-seemed to hit just above the growth -Strongly doubt infectious  #Chronic abdominal pain-see listing under epigastric abdominal pain-she requested a refill on hyoscyamine-she had  been using it for muscle cramping and I advised her this is only for abdominal cramping.  Given hydrocodone a month ago and reports does not currently need refill  # Atrial fibrillation- discovered by pacemaker interrogation (very symptomatic) S: Rate controlled on coreg 6.25 mg BID (extra tablet with palpitations) - on amiodarone 400 mg with fast HR, additional 200 12 hours later if persists per rx Anticoagulated with eliquis 5 mg BID A/P: Appropriately anticoagulated.  Also rate controlled.  As noted with anticoagulation this made me less suspicious of DVT -also denies any new chest pain or shortness of breath   Recommended follow up: Return for as needed for new, worsening, persistent symptoms. Future Appointments  Date Time Provider [place]  01/10/2023  7:00 AM [facility] DEVICE REMOTES [facility]  01/23/2023  2:20 PM Marin Olp, MD [facility]  02/19/2023 11:00 AM Leonie Man, MD [facility] None  03/10/2023 10:30 AM [facility] HEALTH COACH [facility]  04/11/2023  7:00 AM [facility] DEVICE REMOTES [facility]  07/11/2023  7:00 AM [facility] DEVICE REMOTES [facility]  10/10/2023  7:00 AM [facility] DEVICE REMOTES [facility]  01/09/2024  7:00 AM [facility] DEVICE REMOTES [facility]    Lab/Order associations:   ICD-10-CM   1. Acute pain of left knee  M25.562 Ambulatory referral to Sports Medicine    2. PAF (paroxysmal atrial fibrillation) (H[facility])  I48.0       Meds ordered this encounter  Medications   hyoscyamine (ANASPAZ) 0.125 MG TBDP disintergrating tablet    Sig: Place 1 tablet (0.125 mg total) under the tongue every 6 (six) hours as needed (abdominal cramping).    Dispense:  30 tablet    Refill:  5    Return precautions advised.  Garret Reddish, MD

## 2022-11-12 NOTE — Patient Instructions (Addendum)
Call tomorrow morning if you have not heard- hoping to get you in tomorrow or Thursday at latest But if worsening symptoms please let me know   sports medicine  [address], [place], [place] [address] Phone: [phone]

## 2022-11-12 NOTE — Telephone Encounter (Signed)
Patient is scheduled for OV 11/12/22 at 4:20 pm

## 2022-11-13 ENCOUNTER — Ambulatory Visit: Payer: Self-pay

## 2022-11-13 ENCOUNTER — Telehealth: Payer: Self-pay

## 2022-11-13 ENCOUNTER — Ambulatory Visit (INDEPENDENT_AMBULATORY_CARE_PROVIDER_SITE_OTHER): Payer: Medicare Other

## 2022-11-13 ENCOUNTER — Ambulatory Visit (INDEPENDENT_AMBULATORY_CARE_PROVIDER_SITE_OTHER): Payer: Medicare Other | Admitting: Family Medicine

## 2022-11-13 ENCOUNTER — Ambulatory Visit (HOSPITAL_COMMUNITY)
Admission: RE | Admit: 2022-11-13 | Discharge: 2022-11-13 | Disposition: A | Discharge status: Home / Self Care | Payer: Medicare Other | Source: Ambulatory Visit | Attending: Family Medicine | Admitting: Family Medicine

## 2022-11-13 VITALS — BP 148/88 | HR 82 | Ht 68.0 in | Wt 199.0 lb

## 2022-11-13 DIAGNOSIS — G8929 Other chronic pain: Secondary | ICD-10-CM

## 2022-11-13 DIAGNOSIS — M25562 Pain in left knee: Secondary | ICD-10-CM

## 2022-11-13 DIAGNOSIS — M25862 Other specified joint disorders, left knee: Secondary | ICD-10-CM | POA: Diagnosis not present

## 2022-11-13 NOTE — Telephone Encounter (Signed)
Phone call from [facility] Vascular lab that's pt's vascular US did not detect DVT, but revealed a 4x5cm collection of fluid in multiple compartments.

## 2022-11-13 NOTE — Progress Notes (Signed)
Left lower extremity venous study completed.   Preliminary results relayed to [facility] at Dr. Clovis Riley office.  Please see CV Procedures for preliminary results.  Raef Sprigg, RVT  2:23 PM 11/13/22

## 2022-11-13 NOTE — Progress Notes (Signed)
Tricia Goltz, PhD, LAT, ATC acting as a scribe for Tricia Leader, MD.  Subjective:    CC: L knee pain  HPI: Pt is an 82 y/o female presenting with left knee pain ongoing for about 1 month. Pt noticed a "knot" in the posterior aspect of the L knee Sunday evening.  Patient locates pain to all the L knee.  She denies any fevers or chills.  She takes Eliquis.  She has a history of left total knee replacement.  L Knee swelling: no Mechanical symptoms: no Aggravates: transitioning to stand Treatments tried: Tylenol  Pertinent review of Systems: No fevers or chills.  No chest pain or palpitation.  Relevant historical information: Status post left knee replacement.  Eliquis.   Objective:    Vitals:   11/13/22 1245  BP: (!) 148/88  Pulse: 82  SpO2: 96%   General: Well Developed, well nourished, and in no acute distress.   MSK: Left knee: Mature scar anterior knee.  Otherwise normal-appearing Normal motion with crepitation. Palpable tender mass located at the posterior medial knee. The mass is nonpulsatile. Knee strength is intact to flexion and extension.  Lab and Radiology Results  Diagnostic Limited MSK Ultrasound of: Left knee mass posterior medial knee Large loculated hypoechoic structure located at the posterior medial knee visible.  Does not seem to be directly originating from any vascular structures.  No large blood flow seen within the mass on Doppler. Impression: Unclear large mass cystic appearing posterior knee.  Does not appear to be consistent in appearance with Peake's cyst.  X-ray images left knee obtained today personally and independently interpreted Total knee replacement with long tibia stem.  Intact appearing hardware.  No acute fractures. Await formal radiology review  No results found for this or any previous visit (from the past 72 hour(s)). VAS [place] LOWER EXTREMITY VENOUS (DVT)  Result Date: 11/13/2022  Lower Venous DVT Study Patient Name:   [name]  Date of Exam:   11/13/2022 Medical Rec #: [id number]       Accession #:    [id number] Date of Birth: Nov 03, 1940       Patient Gender: F Patient Age:   21 years Exam Location:  [facility] Procedure:      VAS [place] LOWER EXTREMITY VENOUS (DVT) Referring Phys: Ellard Artis Aidaly Cordner --------------------------------------------------------------------------------  Indications: Knee pain for 1 month, "knot" posterior knee.  Anticoagulation: Eliquis. Limitations: Body habitus. Comparison Study: No prior study. Performing Technologist: McKayla Maag RVT, VT  Examination Guidelines: A complete evaluation includes B-mode imaging, spectral Doppler, color Doppler, and power Doppler as needed of all accessible portions of each vessel. Bilateral testing is considered an integral part of a complete examination. Limited examinations for reoccurring indications may be performed as noted. The reflux portion of the exam is performed with the patient in reverse Trendelenburg.  +-----+---------------+---------+-----------+----------+--------------+ RIGHTCompressibilityPhasicitySpontaneityPropertiesThrombus Aging +-----+---------------+---------+-----------+----------+--------------+ CFV  Full           Yes      Yes                                 +-----+---------------+---------+-----------+----------+--------------+ SFJ  Full                                                        +-----+---------------+---------+-----------+----------+--------------+   +---------+---------------+---------+-----------+----------+-------------------+  LEFT     CompressibilityPhasicitySpontaneityPropertiesThrombus Aging      +---------+---------------+---------+-----------+----------+-------------------+ CFV      Full                                                             +---------+---------------+---------+-----------+----------+-------------------+ SFJ      Full                                                              +---------+---------------+---------+-----------+----------+-------------------+ FV Prox  Full                                                             +---------+---------------+---------+-----------+----------+-------------------+ FV Mid   Full                                                             +---------+---------------+---------+-----------+----------+-------------------+ FV Distal               Yes      Yes                  Not well visualized +---------+---------------+---------+-----------+----------+-------------------+ PFV      Full                                                             +---------+---------------+---------+-----------+----------+-------------------+ POP      Full                                                             +---------+---------------+---------+-----------+----------+-------------------+ PTV      Full                                                             +---------+---------------+---------+-----------+----------+-------------------+ PERO                    Yes      Yes                  Not well visualized +---------+---------------+---------+-----------+----------+-------------------+    Summary: RIGHT: - No evidence of common femoral vein obstruction.  LEFT: - There is no evidence of deep vein  thrombosis in the lower extremity.  - Non-vascularized multiloculated cystic structure noted medial to posterior left knee. Area is an anechoic structure with internal debris and echoes measuring approximately 4 x 5 cm.  *See table(s) above for measurements and observations.    Preliminary       Impression and Recommendations:    Assessment and Plan: 82 y.o. female with left posterior medial knee mass.  Based on my ultrasound examination in the clinic and vascular ultrasound examination she has a large hyperechoic cystic mass in the posterior medial knee.  I think the most likely  explanation is a hematoma.  She is on blood thinners and I think probably had a bleed into the posterior medial knee in the last few days.  An atypical knee effusion is a possibility as well.. In the clinic I was reluctant to attempt an aspiration until he had a better understanding of what was going on and proceed with a vascular ultrasound which was done before he got a chance to finish my note.  She is scheduled to see me for recheck on Friday the 15th.  At that time we could try an aspiration or we could try some conservative management.     PDMP not reviewed this encounter. Orders Placed This Encounter  Procedures   [place] LIMITED JOINT SPACE STRUCTURES LOW LEFT(NO LINKED CHARGES)    Order Specific Question:   Reason for Exam (SYMPTOM  OR DIAGNOSIS REQUIRED)    Answer:   Left knee pain    Order Specific Question:   Preferred imaging location?    Answer:   [place]   DG Knee AP/LAT W/[place] Left    Standing Status:   Future    Number of Occurrences:   1    Standing Expiration Date:   12/14/2022    Order Specific Question:   Reason for Exam (SYMPTOM  OR DIAGNOSIS REQUIRED)    Answer:   Left knee pain    Order Specific Question:   Preferred imaging location?    Answer:   [facility]   No orders of the defined types were placed in this encounter.   Discussed warning signs or symptoms. Please see discharge instructions. Patient expresses understanding.   The above documentation has been reviewed and is accurate and complete [name], M.D.

## 2022-11-13 NOTE — Patient Instructions (Addendum)
Thank you for coming in today.   Please get an Xray today before you leave   [name] to the Vascular Lab  [phone]

## 2022-11-14 NOTE — Progress Notes (Signed)
I, [name], CMA acting as a scribe for Tricia Leader, MD.  [name] is a 82 y.o. female who presents to [place] at [facility] today for follow-up mass left knee.  Patient was last seen by Dr. Georgina Snell on 11/13/2022 and was sent for a vascular ultrasound.  Today, patient reports no change in sx since last visit.  She notes its mildly painful.  No fevers or chills.  No severe pain.  She is able to ambulate without much discomfort.  Dx testing: 11/13/2022 LE vascular ultrasound & L knee XR  Pertinent review of systems: No fevers or chills.  Relevant historical information: 3 of the left knee total knee replacement.  On anticoagulation with Eliquis.   Exam:  BP 126/60   Pulse 90   Ht 5\' 8"  (1.727 m)   Wt 199 lb 12.8 oz (90.6 kg)   SpO2 99%   BMI 30.38 kg/m  General: Well Developed, well nourished, and in no acute distress.   MSK: Left knee: Swelling posterior medial knee.  Mildly tender palpation.  No erythema visible.  Normal knee motion.    Lab and Radiology Results No results found for this or any previous visit (from the past 72 hour(s)). VAS [place] LOWER EXTREMITY VENOUS (DVT)  Result Date: 11/13/2022  Lower Venous DVT Study Patient Name:  [name]  Date of Exam:   11/13/2022 Medical Rec #: [id number]       Accession #:    [id number] Date of Birth: Feb 21, 1941       Patient Gender: F Patient Age:   23 years Exam Location:  [facility] Procedure:      VAS [place] LOWER EXTREMITY VENOUS (DVT) Referring Phys: Ellard Artis Mariell Nester --------------------------------------------------------------------------------  Indications: Knee pain for 1 month, "knot" posterior knee.  Anticoagulation: Eliquis. Limitations: Body habitus. Comparison Study: No prior study. Performing Technologist: McKayla Maag RVT, [place]  Examination Guidelines: A complete evaluation includes B-mode imaging, spectral Doppler, color Doppler, and power Doppler as needed of all accessible portions of each  vessel. Bilateral testing is considered an integral part of a complete examination. Limited examinations for reoccurring indications may be performed as noted. The reflux portion of the exam is performed with the patient in reverse Trendelenburg.  +-----+---------------+---------+-----------+----------+--------------+ RIGHTCompressibilityPhasicitySpontaneityPropertiesThrombus Aging +-----+---------------+---------+-----------+----------+--------------+ CFV  Full           Yes      Yes                                 +-----+---------------+---------+-----------+----------+--------------+ SFJ  Full                                                        +-----+---------------+---------+-----------+----------+--------------+   +---------+---------------+---------+-----------+----------+-------------------+ LEFT     CompressibilityPhasicitySpontaneityPropertiesThrombus Aging      +---------+---------------+---------+-----------+----------+-------------------+ CFV      Full                                                             +---------+---------------+---------+-----------+----------+-------------------+ SFJ      Full                                                             +---------+---------------+---------+-----------+----------+-------------------+  FV Prox  Full                                                             +---------+---------------+---------+-----------+----------+-------------------+ FV Mid   Full                                                             +---------+---------------+---------+-----------+----------+-------------------+ FV Distal               Yes      Yes                  Not well visualized +---------+---------------+---------+-----------+----------+-------------------+ PFV      Full                                                              +---------+---------------+---------+-----------+----------+-------------------+ POP      Full                                                             +---------+---------------+---------+-----------+----------+-------------------+ PTV      Full                                                             +---------+---------------+---------+-----------+----------+-------------------+ PERO                    Yes      Yes                  Not well visualized +---------+---------------+---------+-----------+----------+-------------------+     Summary: RIGHT: - No evidence of common femoral vein obstruction.  LEFT: - There is no evidence of deep vein thrombosis in the lower extremity.  - Non-vascularized multiloculated cystic structure noted medial to posterior left knee. Area is an anechoic structure with internal debris and echoes measuring approximately 4 x 5 cm.  *See table(s) above for measurements and observations. Electronically signed by Monica Martinez MD on 11/13/2022 at 10:04:00 PM.    Final        Assessment and Plan: 82 y.o. female with left posterior knee swelling.  Patient has a cystic mass in the posterior knee visible on my limited musculoskeletal ultrasound and nonvascular ultrasound.  I think it is most consistent with a hematoma.  It is much larger than we would typically expect I think due to the Eliquis.  The alternative diagnosis is an atypical appearing joint effusion which is possible following her knee replacement.  Plan for a little bit of watchful waiting and compression.  If not better in a month we will attempt an aspiration.  I would like to be conservative with this if possible given the location.  She understands and agrees.  If worsening or not doing well between now in 1 month she will let me know and we can attempt aspiration sooner.   Discussed warning signs or symptoms. Please see discharge instructions. Patient expresses understanding.   The  above documentation has been reviewed and is accurate and complete [name], M.D.

## 2022-11-14 NOTE — Progress Notes (Signed)
No deep vein thrombosis or blood clot visible.  The ultrasonographer and radiologist on the same thing I did.  He had this large cyst in the back in the knee.  I think it is probably a hematoma.  Will talk about it more on Friday

## 2022-11-15 ENCOUNTER — Ambulatory Visit (INDEPENDENT_AMBULATORY_CARE_PROVIDER_SITE_OTHER): Payer: Medicare Other | Admitting: Family Medicine

## 2022-11-15 VITALS — BP 126/60 | HR 90 | Ht 68.0 in | Wt 199.8 lb

## 2022-11-15 DIAGNOSIS — M25862 Other specified joint disorders, left knee: Secondary | ICD-10-CM

## 2022-11-15 NOTE — Patient Instructions (Addendum)
Thank you for coming in today.   Use the ace wrap  Check back in 1 month

## 2022-11-17 NOTE — Progress Notes (Signed)
Left knee x-ray shows total knee replacement.  No fracture or hardware loosening is visible.

## 2022-11-20 ENCOUNTER — Telehealth: Payer: Self-pay

## 2022-11-20 NOTE — Telephone Encounter (Signed)
Called to provide patient with x-ray results and she noted that the lump on the posterior aspect of her left knee started hurting about a day or so ago.  Not sure if this is concerning?  Please advise?

## 2022-11-20 NOTE — Telephone Encounter (Signed)
Called and spoke with patient. The area was a little less painful today, pt notes being less active today. She appreciates Dr. Georgina Snell offering to stay after clinic to see her. Pt will plan to call me back tomorrow before 4:00 PM if she would like to have Dr. Georgina Snell look at the area on Friday after clinic.

## 2022-11-20 NOTE — Telephone Encounter (Signed)
I am worried about that.  If that bump starts hurting that may force [place] to do something. My schedule is extremely full but I will be able to stay after my normal clinic hours on Friday in the early afternoon and get you an appointment to potentially try to drain the bump at the back of the knee at about 1:00 or 130 on Friday if you could make that appointment.

## 2022-11-22 ENCOUNTER — Other Ambulatory Visit: Payer: Self-pay

## 2022-11-22 ENCOUNTER — Ambulatory Visit (INDEPENDENT_AMBULATORY_CARE_PROVIDER_SITE_OTHER): Payer: Medicare Other | Admitting: Family Medicine

## 2022-11-22 VITALS — BP 126/62 | HR 90 | Ht 68.0 in | Wt 199.0 lb

## 2022-11-22 DIAGNOSIS — I701 Atherosclerosis of renal artery: Secondary | ICD-10-CM

## 2022-11-22 DIAGNOSIS — M25862 Other specified joint disorders, left knee: Secondary | ICD-10-CM | POA: Diagnosis not present

## 2022-11-22 NOTE — Patient Instructions (Addendum)
Thank you for coming in today.   Call or go to the ER if you develop a large red swollen joint with extreme pain or oozing puss.    Please get labs today before you leave   Plan a CT scan.   If we need in the future we can possibly do a MRI but I want to know sooner if we can what is happening with your knee with a faster test.

## 2022-11-22 NOTE — Progress Notes (Signed)
Tricia Goltz, PhD, LAT, ATC acting as a scribe for Tricia Leader, MD.  [name] is a 82 y.o. female who presents to [place] at [facility] today for f/u mass on the posterior aspect of her L knee. Pt was last seen by Dr. Georgina Herring on 11/15/22 and was advised to use compression and plan for watchful waiting. Upon calling pt w/ XR results she noted some tenderness over the lump and was scheduled for a f/u visit today.   Today, pt reports the lump seems to have gotten a little bit bigger. Pt also notes the area is also tender.  She denies any fevers or chills or erythema.    Dx testing: 11/13/2022 LE vascular ultrasound & L knee XR   Pertinent review of systems: No fevers or chills.  Relevant historical information: Positive for pacemaker.  Not sure if MRI compatible.  Positive for left knee total knee replacement.   Exam:  BP 126/62   Pulse 90   Ht 5\' 8"  (1.727 m)   Wt 199 lb (90.3 kg)   SpO2 99%   BMI 30.26 kg/m  General: Well Developed, well nourished, and in no acute distress.   MSK: Posterior medial knee large visible mass.  No erythema.  Mildly tender palpation.    Lab and Radiology Results  Procedure: Real-time Ultrasound Guided attempted aspiration posterior medial knee mass. Device: Philips Affiniti 50G Images permanently stored and available for review in PACS Ultrasound evaluation prior to attempted aspiration of the posterior knee mass showed a complicated loculated structure with hypoechoic and isoechoic structures.  Some vascular activity present on Doppler. Verbal informed consent obtained.  Discussed risks and benefits of procedure. Warned about infection, bleeding, hyperglycemia damage to structures among others. Patient expresses understanding and agreement Time-out conducted.   Noted no overlying erythema, induration, or other signs of local infection.   Skin prepped in a sterile fashion.   Local anesthesia: Topical Ethyl chloride.    With sterile technique and under real time ultrasound guidance: 2 mL of injected into subcutaneous tissue along planned aspiration tract achieving good anesthesia. Skin again sterilized with isopropyl alcohol. 18-gauge needle used to access the most cystic-appearing component. No fluid was able to be aspirated despite repositioning the needle 3 times. The decision was made to discontinue the procedure at this time. Advised to call if fevers/chills, erythema, induration, drainage, or persistent bleeding.   Images permanently stored and available for review in the ultrasound unit.  Impression: Discontinued attempted aspiration posterior knee mass.   EXAM: LEFT KNEE 3 VIEWS   COMPARISON:  None Available.   FINDINGS: Total knee arthroplasty changes are noted. There is no evidence of acute fracture or hardware loosening. There is a trace suprapatellar joint effusion. The soft tissues are unremarkable   IMPRESSION: 1. Total knee arthroplasty changes with no evidence of acute fracture or hardware loosening. 2. Trace suprapatellar joint effusion.     Electronically Signed   By: Brett Fairy M.D.   On: 11/16/2022 03:11 I, [name], personally (independently) visualized and performed the interpretation of the images attached in this note.    Assessment and Plan: 82 y.o. female with left posterior knee mass.  Etiology is unclear.  It seems to be worsening.  I was unable to aspirate any fluid from the mass which makes abscess or septic knee joint very unlikely.  Hematoma is also unlikely as she does not have any bruising distally from the mass 2 weeks after it has  begun.  At this point malignancy or some other explanation is a possibility.  Plan for advanced imaging.  Unfortunately she has a pacemaker so we are not can to be able to get an MRI at all or rapidly.  I discussed the case with the radiologist and CT scan with contrast is her next best study.  She does have a allergy history  to iodine contrast but on the allergy documented and after talking with [name] she has had multiple iodine studies without any premedication subsequently without any problems.  She notes that prednisone makes her very agitated and after discussion we decided not to use any.  Predosing.  She will take 50 mg of Benadryl prior to the CT scan however.  Additionally she has a GFR of 50.  Plan to check metabolic panel today.  She left without getting her labs.  My front desk is calling her to have her come back and get her labs.   PDMP not reviewed this encounter. Orders Placed This Encounter  Procedures   [place] LIMITED JOINT SPACE STRUCTURES LOW LEFT(NO LINKED CHARGES)    Order Specific Question:   Reason for Exam (SYMPTOM  OR DIAGNOSIS REQUIRED)    Answer:   left knee mass    Order Specific Question:   Preferred imaging location?    Answer:   [facility]   CT KNEE LEFT W CONTRAST    Did not need prednsone for pre dose contrast allergy. OK to use benadryl    Standing Status:   Future    Standing Expiration Date:   11/22/2023    Order Specific Question:   If indicated for the ordered procedure, I authorize the administration of contrast media per Radiology protocol    Answer:   Yes    Order Specific Question:   Does the patient have a contrast media/X-ray dye allergy?    Answer:   No    Comments:   Has has multiple studies without problems since listed allergies    Order Specific Question:   Preferred imaging location?    Answer:   [facility]   Basic Metabolic Panel (BMET)   No orders of the defined types were placed in this encounter.    Discussed warning signs or symptoms. Please see discharge instructions. Patient expresses understanding.   The above documentation has been reviewed and is accurate and complete [name], M.D.

## 2022-11-26 ENCOUNTER — Other Ambulatory Visit: Payer: Self-pay | Admitting: Sports Medicine

## 2022-11-26 ENCOUNTER — Telehealth: Payer: Self-pay | Admitting: Family Medicine

## 2022-11-26 ENCOUNTER — Telehealth (HOSPITAL_BASED_OUTPATIENT_CLINIC_OR_DEPARTMENT_OTHER): Payer: Self-pay

## 2022-11-26 DIAGNOSIS — R2242 Localized swelling, mass and lump, left lower limb: Secondary | ICD-10-CM

## 2022-11-26 MED ORDER — PREDNISONE 50 MG PO TABS: ORAL_TABLET | ORAL | 0 refills | Status: DC

## 2022-11-26 NOTE — Telephone Encounter (Signed)
Patient called stating that she was having trouble scheduling her CT with [facility]. I contacted [place], they are not able to do the CT with contrast and wanted the order changed to without. After speaking with patient, we have changed the location to [facility] so that the test can be done as ordered by Dr Georgina Snell.

## 2022-11-26 NOTE — Progress Notes (Unsigned)
Prednisone 50mg  po 13 hours before, po 7 hours before and po 1 hour before and Benadryl 50 mg po 1 hour before

## 2022-11-26 NOTE — Telephone Encounter (Signed)
Patient has a past allergy to contrast. Per a note from Dr Georgina Snell in the order "Did not need prednsone for pre dose contrast allergy. OK to use benadryl."  When the patient contacted them to schedule, she was told that she had to do the 13 hours prep before the CT despite Dr Clovis Riley note.   Can Dr Glennon Mac send in the prep needed?  Please advise.

## 2022-11-26 NOTE — Addendum Note (Signed)
Addended by: Judy Pimple R on: 11/26/2022 11:05 AM   Modules accepted: Orders

## 2022-11-26 NOTE — Telephone Encounter (Signed)
Prednisone 50mg  po 13 hours before, po 7 hours before and po 1 hour before and Benadryl 50 mg po 1 hour before

## 2022-11-26 NOTE — Telephone Encounter (Signed)
Spoke to patient and informed.  She will pick up the medication and contact the [place] to schedule.

## 2022-11-27 ENCOUNTER — Ambulatory Visit (HOSPITAL_BASED_OUTPATIENT_CLINIC_OR_DEPARTMENT_OTHER)
Admission: RE | Admit: 2022-11-27 | Discharge: 2022-11-27 | Disposition: A | Discharge status: Home / Self Care | Payer: Medicare Other | Source: Ambulatory Visit | Attending: Family Medicine | Admitting: Family Medicine

## 2022-11-27 ENCOUNTER — Other Ambulatory Visit (INDEPENDENT_AMBULATORY_CARE_PROVIDER_SITE_OTHER): Payer: Medicare Other

## 2022-11-27 DIAGNOSIS — Z96652 Presence of left artificial knee joint: Secondary | ICD-10-CM | POA: Diagnosis not present

## 2022-11-27 DIAGNOSIS — I701 Atherosclerosis of renal artery: Secondary | ICD-10-CM

## 2022-11-27 DIAGNOSIS — Z471 Aftercare following joint replacement surgery: Secondary | ICD-10-CM | POA: Diagnosis not present

## 2022-11-27 DIAGNOSIS — M25862 Other specified joint disorders, left knee: Secondary | ICD-10-CM | POA: Insufficient documentation

## 2022-11-27 LAB — BASIC METABOLIC PANEL
BUN: 48 mg/dL — ABNORMAL HIGH (ref 6–23)
CO2: 27 mEq/L (ref 19–32)
Calcium: 9.6 mg/dL (ref 8.4–10.5)
Chloride: 95 mEq/L — ABNORMAL LOW (ref 96–112)
Creatinine, Ser: 1.38 mg/dL — ABNORMAL HIGH (ref 0.40–1.20)
GFR: 36.11 mL/min — ABNORMAL LOW (ref >60.00)
Glucose, Bld: 193 mg/dL — ABNORMAL HIGH (ref 70–99)
Potassium: 4.6 mEq/L (ref 3.5–5.1)
Sodium: 132 mEq/L — ABNORMAL LOW (ref 135–145)

## 2022-11-27 MED ORDER — IOHEXOL 300 MG/ML  SOLN
100.0000 mL | Freq: Once | INTRAMUSCULAR | Status: AC | PRN
  Administered 2022-11-27: 100 mL via INTRAVENOUS

## 2022-11-27 NOTE — Addendum Note (Signed)
Addended by: Vilma Meckel R on: 11/27/2022 04:10 PM   Modules accepted: Orders

## 2022-12-02 ENCOUNTER — Telehealth: Payer: Self-pay | Admitting: Family Medicine

## 2022-12-02 NOTE — Telephone Encounter (Signed)
Forwarding to Dr. Georgina Snell to advise, please see below message from patient:   Morphies, Nicholaus Bloom hours ago (9:13 AM)   CM Patient called in reference to her CT Scan results and referral to Orthopedic Oncology at [facility]. She said that her husband recently had surgery with Dr Verita Lamb at [facility] and they were very happy with him. She asked if this is someone that Dr Georgina Snell would recommended as well?  If not, she is fine going wherever he thinks is best.

## 2022-12-02 NOTE — Telephone Encounter (Signed)
I referred [name] to orthopedic oncology.  She will need to get a CD made to take with her to [facility].  Please organize this and update her.  I think I forgot to tell her she needs a CD.

## 2022-12-02 NOTE — Progress Notes (Signed)
Kidney function has worsened over the last 1 month.  Will keep a close eye on this.

## 2022-12-02 NOTE — Telephone Encounter (Signed)
Patient called in reference to her CT Scan results and referral to Orthopedic Oncology at [facility]. She said that her husband recently had surgery with Dr Verita Lamb at [facility] and they were very happy with him. She asked if this is someone that Dr Georgina Snell would recommended as well?  If not, she is fine going wherever he thinks is best.

## 2022-12-04 NOTE — Telephone Encounter (Signed)
Unfortunately he has the wrong kind of surgeon.  He needs to be an orthopedic oncologist surgeon which are very limited and few and far between.

## 2022-12-04 NOTE — Telephone Encounter (Signed)
Called pt and advised. Reviewed referral with patient, she says that [place] is conveniently located to her, this is where her husband goes for cancer treatments.

## 2022-12-06 DIAGNOSIS — M7989 Other specified soft tissue disorders: Secondary | ICD-10-CM | POA: Diagnosis not present

## 2022-12-06 DIAGNOSIS — M2041 Other hammer toe(s) (acquired), right foot: Secondary | ICD-10-CM | POA: Diagnosis not present

## 2022-12-06 DIAGNOSIS — D4819 Other specified neoplasm of uncertain behavior of connective and other soft tissue: Secondary | ICD-10-CM | POA: Diagnosis not present

## 2022-12-09 ENCOUNTER — Other Ambulatory Visit: Payer: Self-pay | Admitting: Family Medicine

## 2022-12-09 DIAGNOSIS — H8110 Benign paroxysmal vertigo, unspecified ear: Secondary | ICD-10-CM

## 2022-12-16 ENCOUNTER — Ambulatory Visit: Payer: Medicare Other | Admitting: Family Medicine

## 2022-12-16 NOTE — Progress Notes (Deleted)
   Rubin Payor, PhD, LAT, ATC acting as a scribe for [name], MD.  [name] is a 82 y.o. female who presents to [facility] at [facility] today for follow-up left knee pain and soft tissue mass.  Patient was last seen by Dr. Denyse Amass on 11/22/22 and the mass was unsuccessfully aspirated, labs were checked, and CT w/ contrast was ordered. She had a surgical consultation on 12/06/22 w/ [facility] and a biopsy of the mass was obtained.   Today, pt reports ***  Dx testing: 11/27/22 L knee CT w/ contrast & labs 11/13/2022 LE vascular ultrasound & L knee XR   Pertinent review of systems: ***  Relevant historical information: ***   Exam:  There were no vitals taken for this visit. General: Well Developed, well nourished, and in no acute distress.   MSK: ***    Lab and Radiology Results No results found for this or any previous visit (from the past 72 hour(s)). No results found.     Assessment and Plan: 82 y.o. female with ***   PDMP not reviewed this encounter. No orders of the defined types were placed in this encounter.  No orders of the defined types were placed in this encounter.    Discussed warning signs or symptoms. Please see discharge instructions. Patient expresses understanding.   ***

## 2022-12-19 ENCOUNTER — Telehealth: Payer: Self-pay

## 2022-12-19 ENCOUNTER — Encounter: Payer: Self-pay | Admitting: Family Medicine

## 2022-12-19 ENCOUNTER — Ambulatory Visit (INDEPENDENT_AMBULATORY_CARE_PROVIDER_SITE_OTHER): Payer: Medicare Other | Admitting: Family Medicine

## 2022-12-19 VITALS — BP 116/60 | HR 83 | Ht 68.0 in | Wt 194.8 lb

## 2022-12-19 DIAGNOSIS — M724 Pseudosarcomatous fibromatosis: Secondary | ICD-10-CM | POA: Diagnosis not present

## 2022-12-19 DIAGNOSIS — M25862 Other specified joint disorders, left knee: Secondary | ICD-10-CM

## 2022-12-19 NOTE — Patient Instructions (Signed)
Thank you for coming in today.   I will work on getting you to the right people ASAP.   If Junious Dresser can get you a MRI sooner have her contact me. [phone] have her text me.   I plan to get you with the surgeon soon and talk to the person you have been working with at [facility].

## 2022-12-19 NOTE — Progress Notes (Signed)
I, Stevenson Clinch, CMA acting as a scribe for [name], MD.  [name] is a 82 y.o. female who presents to [facility] at [facility] today for follow-up left knee pain and soft tissue mass.  Patient was last seen by Dr. Denyse Amass on 11/22/22 and the mass was unsuccessfully aspirated, labs were checked, and CT w/ contrast was ordered. She had a surgical consultation on 12/06/22 w/ [facility] and a biopsy of the mass was obtained by nurse practitioner Jeral Pinch.  It took a while but she did hear back from Ms. Shepherd on April 12 regarding the mass.  The pathology showed changes consistent with nodular fasciitis however further evaluation is still pending.  [name] was told that this is a self-limiting problem and no further treatment is necessary.  She finds her knee to be uncomfortable and very much would like it to be removed.  At the end of the visit with me she did not have any further appointments scheduled with orthopedic oncology and was frustrated with her experience.  Today, pt reports that preliminary results of biopsy is ready.   Dx testing: 11/27/22 L knee CT w/ contrast & labs 11/13/2022 LE vascular ultrasound & L knee XR    Pertinent review of systems: No fevers or chills  Relevant historical information: CAD   Exam:  BP 116/60   Pulse 83   Ht  (1.727 m)   Wt 194 lb 12.8 oz (88.4 kg)   SpO2 96%   BMI 29.62 kg/m  General: Well Developed, well nourished, and in no acute distress.   MSK: Left knee mature scar anterior knee.  Firm tender mass posterior medial knee.    Lab and Radiology Results  See scanned documents for pathology report showing low-grade myofibroblastic neoplasm favor nodular fasciitis.  Further evaluation via FISH analysis is still pending to assess USP6 gene arrangement     Assessment and Plan: 82 y.o. female with left posterior knee mass very likely to be nodular fasciitis.  I had a discussion  with the patient [name].  She is very concerned and finds the mass to be uncomfortable and would like it to be removed if possible.  She has been frustrated with the delay in getting her pathology report back. We had a discussion that generally I do think the place she went to it is quite good for this particular issue and I was able to call the office.  I was able to eventually speak with nurse practitioner Frazier Richards following the visit.  I explained [name]'s situation and feelings.  Ms. Frazier Richards completely understands and was able to call [name] back later that evening and get her an appointment scheduled with a orthopedic oncology surgeon next week for a surgical consultation.  This seems to be satisfying to all parties. If think it is likely that watchful waiting is going to be the treatment here is the seems to be a self-limiting problem but I do think [name] would appreciate and would benefit from a dedicated face-to-face visit to discuss surgical options regarding this.  I just do not think the quick phone call result report was quite enough.   Discussed warning signs or symptoms. Please see discharge instructions. Patient expresses understanding.   The above documentation has been reviewed and is accurate and complete [name], M.D.  Total encounter time 40 minutes including face-to-face time with the patient and, reviewing past medical record, and charting on the date of service.  Multiple long phone calls to [facility] required

## 2022-12-19 NOTE — Telephone Encounter (Signed)
Was asked by Dr. Denyse Amass to call pt. Left a VM to let her know that [facility] would be calling her to schedule a visit w/ the surgeon and that Dr. Denyse Amass would be speaking to the NP that that she saw previously. Advised to call us back if she had any questions or concerns.

## 2022-12-20 DIAGNOSIS — M724 Pseudosarcomatous fibromatosis: Secondary | ICD-10-CM | POA: Insufficient documentation

## 2022-12-24 DIAGNOSIS — M729 Fibroblastic disorder, unspecified: Secondary | ICD-10-CM | POA: Diagnosis not present

## 2022-12-27 DIAGNOSIS — E119 Type 2 diabetes mellitus without complications: Secondary | ICD-10-CM | POA: Diagnosis not present

## 2022-12-27 DIAGNOSIS — I110 Hypertensive heart disease with heart failure: Secondary | ICD-10-CM | POA: Diagnosis not present

## 2022-12-27 DIAGNOSIS — E1142 Type 2 diabetes mellitus with diabetic polyneuropathy: Secondary | ICD-10-CM | POA: Diagnosis not present

## 2022-12-27 DIAGNOSIS — E785 Hyperlipidemia, unspecified: Secondary | ICD-10-CM | POA: Diagnosis not present

## 2022-12-27 DIAGNOSIS — Z01818 Encounter for other preprocedural examination: Secondary | ICD-10-CM | POA: Diagnosis not present

## 2022-12-27 DIAGNOSIS — I4891 Unspecified atrial fibrillation: Secondary | ICD-10-CM | POA: Diagnosis not present

## 2022-12-27 DIAGNOSIS — I5030 Unspecified diastolic (congestive) heart failure: Secondary | ICD-10-CM | POA: Diagnosis not present

## 2022-12-27 DIAGNOSIS — Z7901 Long term (current) use of anticoagulants: Secondary | ICD-10-CM | POA: Diagnosis not present

## 2022-12-27 DIAGNOSIS — Z9861 Coronary angioplasty status: Secondary | ICD-10-CM | POA: Diagnosis not present

## 2022-12-27 DIAGNOSIS — I2510 Atherosclerotic heart disease of native coronary artery without angina pectoris: Secondary | ICD-10-CM | POA: Diagnosis not present

## 2022-12-27 LAB — HEMOGLOBIN A1C: Hemoglobin A1C: 6.4

## 2022-12-28 ENCOUNTER — Other Ambulatory Visit: Payer: Self-pay | Admitting: Cardiology

## 2022-12-30 DIAGNOSIS — D492 Neoplasm of unspecified behavior of bone, soft tissue, and skin: Secondary | ICD-10-CM | POA: Diagnosis not present

## 2022-12-30 DIAGNOSIS — E119 Type 2 diabetes mellitus without complications: Secondary | ICD-10-CM | POA: Diagnosis not present

## 2022-12-30 DIAGNOSIS — I4891 Unspecified atrial fibrillation: Secondary | ICD-10-CM | POA: Diagnosis not present

## 2022-12-30 DIAGNOSIS — K219 Gastro-esophageal reflux disease without esophagitis: Secondary | ICD-10-CM | POA: Diagnosis not present

## 2022-12-30 DIAGNOSIS — C4922 Malignant neoplasm of connective and soft tissue of left lower limb, including hip: Secondary | ICD-10-CM | POA: Diagnosis not present

## 2022-12-30 DIAGNOSIS — M728 Other fibroblastic disorders: Secondary | ICD-10-CM | POA: Diagnosis not present

## 2022-12-30 DIAGNOSIS — I509 Heart failure, unspecified: Secondary | ICD-10-CM | POA: Diagnosis not present

## 2022-12-30 DIAGNOSIS — I2510 Atherosclerotic heart disease of native coronary artery without angina pectoris: Secondary | ICD-10-CM | POA: Diagnosis not present

## 2022-12-30 DIAGNOSIS — I110 Hypertensive heart disease with heart failure: Secondary | ICD-10-CM | POA: Diagnosis not present

## 2022-12-30 DIAGNOSIS — Z7901 Long term (current) use of anticoagulants: Secondary | ICD-10-CM | POA: Diagnosis not present

## 2022-12-30 DIAGNOSIS — E785 Hyperlipidemia, unspecified: Secondary | ICD-10-CM | POA: Diagnosis not present

## 2022-12-30 DIAGNOSIS — M109 Gout, unspecified: Secondary | ICD-10-CM | POA: Diagnosis not present

## 2022-12-30 DIAGNOSIS — Z79899 Other long term (current) drug therapy: Secondary | ICD-10-CM | POA: Diagnosis not present

## 2023-01-08 DIAGNOSIS — M545 Low back pain, unspecified: Secondary | ICD-10-CM | POA: Diagnosis not present

## 2023-01-10 ENCOUNTER — Ambulatory Visit (INDEPENDENT_AMBULATORY_CARE_PROVIDER_SITE_OTHER): Payer: Medicare Other

## 2023-01-10 DIAGNOSIS — I441 Atrioventricular block, second degree: Secondary | ICD-10-CM

## 2023-01-10 LAB — CUP PACEART REMOTE DEVICE CHECK
Battery Remaining Longevity: 164 mo
Battery Voltage: 3.04 V
Brady Statistic AP VP Percent: 0.05 %
Brady Statistic AP VS Percent: 5.02 %
Brady Statistic AS VP Percent: 0.03 %
Brady Statistic AS VS Percent: 94.9 %
Brady Statistic RA Percent Paced: 5.03 %
Brady Statistic RV Percent Paced: 0.08 %
Date Time Interrogation Session: 20240509212809
Implantable Lead Connection Status: 753985
Implantable Lead Connection Status: 753985
Implantable Lead Implant Date: 20221107
Implantable Lead Implant Date: 20221107
Implantable Lead Location: 753859
Implantable Lead Location: 753860
Implantable Lead Model: 5076
Implantable Lead Model: 5076
Implantable Pulse Generator Implant Date: 20221107
Lead Channel Impedance Value: 323 Ohm
Lead Channel Impedance Value: 437 Ohm
Lead Channel Impedance Value: 475 Ohm
Lead Channel Impedance Value: 475 Ohm
Lead Channel Pacing Threshold Amplitude: 0.625 V
Lead Channel Pacing Threshold Amplitude: 0.875 V
Lead Channel Pacing Threshold Pulse Width: 0.4 ms
Lead Channel Pacing Threshold Pulse Width: 0.4 ms
Lead Channel Sensing Intrinsic Amplitude: 10.125 mV
Lead Channel Sensing Intrinsic Amplitude: 10.125 mV
Lead Channel Sensing Intrinsic Amplitude: 2.875 mV
Lead Channel Sensing Intrinsic Amplitude: 2.875 mV
Lead Channel Setting Pacing Amplitude: 1.5 V
Lead Channel Setting Pacing Amplitude: 2 V
Lead Channel Setting Pacing Pulse Width: 0.4 ms
Lead Channel Setting Sensing Sensitivity: 1.2 mV
Zone Setting Status: 755011
Zone Setting Status: 755011

## 2023-01-12 ENCOUNTER — Other Ambulatory Visit: Payer: Self-pay | Admitting: Family Medicine

## 2023-01-14 DIAGNOSIS — M7989 Other specified soft tissue disorders: Secondary | ICD-10-CM | POA: Diagnosis not present

## 2023-01-21 DIAGNOSIS — C499 Malignant neoplasm of connective and soft tissue, unspecified: Secondary | ICD-10-CM | POA: Diagnosis not present

## 2023-01-23 ENCOUNTER — Encounter: Payer: Self-pay | Admitting: Family Medicine

## 2023-01-23 ENCOUNTER — Ambulatory Visit (INDEPENDENT_AMBULATORY_CARE_PROVIDER_SITE_OTHER): Payer: Medicare Other | Admitting: Family Medicine

## 2023-01-23 VITALS — BP 138/74 | HR 76 | Temp 97.8°F | Ht 68.0 in | Wt 196.2 lb

## 2023-01-23 DIAGNOSIS — I480 Paroxysmal atrial fibrillation: Secondary | ICD-10-CM | POA: Diagnosis not present

## 2023-01-23 DIAGNOSIS — I25119 Atherosclerotic heart disease of native coronary artery with unspecified angina pectoris: Secondary | ICD-10-CM | POA: Diagnosis not present

## 2023-01-23 DIAGNOSIS — R300 Dysuria: Secondary | ICD-10-CM

## 2023-01-23 DIAGNOSIS — E1142 Type 2 diabetes mellitus with diabetic polyneuropathy: Secondary | ICD-10-CM | POA: Diagnosis not present

## 2023-01-23 DIAGNOSIS — C499 Malignant neoplasm of connective and soft tissue, unspecified: Secondary | ICD-10-CM | POA: Diagnosis not present

## 2023-01-23 DIAGNOSIS — I5030 Unspecified diastolic (congestive) heart failure: Secondary | ICD-10-CM | POA: Diagnosis not present

## 2023-01-23 NOTE — Progress Notes (Signed)
Phone [phone] In person visit   Subjective:   [name] is a 82 y.o. year old very pleasant female patient who presents for/with See problem oriented charting Chief Complaint  Patient presents with   Diabetes   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    Priority: High   Osteoporosis 01/22/2022    Priority: High   Pacemaker 07/09/2021    Priority: High   Mobitz type 2 second degree atrioventricular block 06/20/2021    Priority: High   PAT (paroxysmal atrial tachycardia) 06/20/2021    Priority: High   PAF (paroxysmal atrial fibrillation) (HCC) 10/16/2020    Priority: High   PAD (peripheral artery disease) (HCC) 01/14/2017    Priority: High   Type 2 diabetes mellitus with peripheral neuropathy (HCC) 10/09/2016    Priority: High   Mesenteric artery stenosis (HCC) 04/20/2014    Priority: High   Abdominal pain, epigastric 01/19/2014    Priority: High   Congestive heart failure with LV diastolic dysfunction, NYHA class 2 (HCC)     Priority: High   Celiac artery stenosis: 60% by Duplex - 90-95% by cath - Med Rx 11/27/2013    Priority: High   Renal artery stenosis (HCC) 08/29/2012    Priority: High   Coronary artery disease involving native coronary artery of native heart with angina pectoris (HCC) 10/30/2011    Priority: High   CAD S/P percutaneous coronary angioplasty: pRCA BMS, mLAD BMS overlapped prox with DES for ISR 07/30/2010    Priority: High   B12 deficiency 01/10/2022    Priority: Medium    Continuous severe abdominal pain 10/11/2016    Priority: Medium    Exertional dyspnea 03/16/2015    Priority: Medium    Idiopathic chronic pancreatitis suspected 03/10/2014    Priority: Medium    Abnormal CT of liver-possible cirrhosis 03/10/2014    Priority: Medium    Stasis edema of bilateral lower extremity     Priority: Medium    Essential hypertension     Priority: Medium     Hyperlipidemia associated with type 2 diabetes mellitus (HCC)     Priority: Medium    Myalgia due to statin 12/29/2011    Priority: Medium    GERD (gastroesophageal reflux disease) 01/24/2009    Priority: Medium    Hypothyroid 11/12/2007    Priority: Medium    Irritable bowel syndrome 11/12/2007    Priority: Medium    PANCREAS DIVISUM 11/12/2007    Priority: Medium    Allergic rhinitis 01/10/2022    Priority: Low   Pneumothorax 07/10/2021    Priority: Low   Syncope and collapse 06/20/2021    Priority: Low   Hallux valgus (acquired), right foot 08/17/2018    Priority: Low   Costochondritis 01/21/2017    Priority: Low   Onychomycosis due to dermatophyte 11/16/2015    Priority: Low   Abnormal electrocardiogram during exercise stress test 03/15/2015    Priority: Low   Dysphagia 03/10/2014    Priority: Low   Long term current use of antithrombotics/antiplatelets - clopidogrel 03/10/2014    Priority: Low   Carotid stenosis 02/27/2014    Priority: Low   Leg cramps 01/19/2014    Priority: Low   Palpitations 01/12/2014    Priority: Low   Diarrhea due to malabsorption 01/24/2009    Priority: Low   Renal calculus 11/12/2007    Priority: Low   HIATAL HERNIA 11/20/2005    Priority:  Low   Nodular fasciitis 12/20/2022    Medications- reviewed and updated Current Outpatient Medications  Medication Sig Dispense Refill   allopurinol (ZYLOPRIM) 100 MG tablet Take 2 tablets (200 mg total) by mouth daily. 180 tablet 3   amiodarone (PACERONE) 200 MG tablet TAKE 400 MG (2 TABLETS) ONE DOSE AS NEED FOR A FAST HEART RATE ,IF AFTER 12 HOURS STILL PRESENT TAKE 200 MG ( 1 TABLET) ONE DOSE FOR EACH EPISODE 180 tablet 1   apixaban (ELIQUIS) 5 MG TABS tablet Take 1 tablet (5 mg total) by mouth 2 (two) times daily. 180 tablet 3   Artificial Tear Solution (TEARS RENEWED OP) Apply 1 drop to eye daily as needed (dry eyes).     carvedilol (COREG) 6.25 MG tablet TAKE 1 TABLET BY MOUTH TWICE DAILY WITH  A MEAL. MAY TAKE AN EXTRA TABLET DAILY IF HAVING PALPATIONS (Patient taking differently: Take 6.25 mg by mouth 2 (two) times daily with a meal.) 225 tablet 3   colchicine 0.6 MG tablet Take by mouth.     cyanocobalamin (VITAMIN B12) 1000 MCG/ML injection 1000 MCG INJECTION ONCE PER MONTH. PLEASE PROVIDE SYRINGES AS WELL 3 mL 5   diclofenac sodium (VOLTAREN) 1 % GEL Apply 2 g topically 4 (four) times daily as needed (pain).     furosemide (LASIX) 80 MG tablet Take 80 mg by mouth 2 (two) times daily.     HYDROcodone-acetaminophen (NORCO/VICODIN) 5-325 MG tablet Take 1 tablet by mouth every 6 (six) hours as needed for moderate pain or severe pain (do not take tylenol with this. do not drive for 8 hours after taking.). 20 tablet 0   hyoscyamine (ANASPAZ) 0.125 MG TBDP disintergrating tablet Place 1 tablet (0.125 mg total) under the tongue every 6 (six) hours as needed (abdominal cramping). 30 tablet 5   indapamide (LOZOL) 2.5 MG tablet TAKE ONE TABLET BY MOUTH DAILY, 30 MINUTES BEFORE DAILY FUROSEMIDE. 90 tablet 2   isosorbide mononitrate (IMDUR) 30 MG 24 hr tablet TAKE 1 TABLET BY MOUTH EVERY DAY 90 tablet 3   lidocaine (XYLOCAINE) 5 % ointment Apply 1 application. topically as needed. 35.44 g 0   losartan (COZAAR) 50 MG tablet TAKE 1 TABLET BY MOUTH EVERY DAY 90 tablet 3   meclizine (ANTIVERT) 12.5 MG tablet TAKE 1 TABLET BY MOUTH THREE TIMES A DAY AS NEEDED FOR DIZZINESS 30 tablet 1   nitroGLYCERIN (NITROSTAT) 0.4 MG SL tablet DISSOLVE 1 TABLET UNDER THE TONGUE EVERY 5 MINUTES AS NEEDED FOR CHEST PAIN 25 tablet 4   spironolactone (ALDACTONE) 25 MG tablet TAKE 1 TABLET BY MOUTH AS DIRECTED. TAKE 1 TABLET ON MONDAYS, WEDNESDAYS, AND FRIDAYS AT 6 PM. 45 tablet 3   alendronate (FOSAMAX) 70 MG tablet Take 1 tablet (70 mg total) by mouth every 7 (seven) days. Take with a full glass of water on an empty stomach. (Patient not taking: Reported on 01/23/2023) 5 tablet 11   No current facility-administered  medications for this visit.     Objective:  BP 138/74   Pulse 76   Temp 97.8 F (36.6 C) (Temporal)   Ht 5\' 8"  (1.727 m)   Wt 196 lb 3.2 oz (89 kg)   SpO2 98%   BMI 29.83 kg/m  Gen: NAD, resting comfortably CV: irregularly irregular  no murmurs rubs or gallops Lungs: CTAB no crackles, wheeze, rhonchi Ext: 1+ edema left > right Skin: warm, dry Musculoskeletal: wrapped left knee up    Assessment and Plan   # Left knee  mass-ultimately diagnosed with primary myxofibrosarcoma-plan on visit 01/21/2023 was for CT chest abdomen pelvis and then referral to medical oncology and radiation oncology as there was some extension to the resection margins-was referred to Dr. Myna Hidalgo- would need rather extensive wider excision.   #Chronic abdominal pain-see listing under epigastric abdominal pain- ongoing issues- saw Dr. Noel Gerold and they were going to see if it was coming from back - considering steroid shot and want to see what hematology/oncology thinks first  #CAD with angina- on imdur- despite PCI x2 (last 2011) #hyperlipidemia but with myalgias related to statins S: Medication: imdur 30 mg, coreg 6.25 mg BID, on eliquis 5 mg  BID and plavix discontinued - no cholesterol treatment as of 2023-statin intolerant and failed other lipid management - has not tried pcsk9 inhibitors Lab Results  Component Value Date   CHOL 229 (H) 01/10/2022   HDL 36.90 (L) 01/10/2022   LDLCALC 152 (H) 09/08/2019   LDLDIRECT 147.0 01/10/2022   TRIG 327.0 (H) 01/10/2022   CHOLHDL 6 01/10/2022   A/P: coronary artery disease with stable angina- continue current medications  Cholesterol above goal but does not tolerate statins- and holding off on injectables per her preference   # Atrial fibrillation- discovered by pacemaker interrogation (very symptomatic) S: Rate controlled on coreg 6.25 mg BID (extra tablet with palpitations) - on amiodarone 400 mg with fast HR, additional 200 12 hours later if persists per  prescription- hasn't needed Anticoagulated with eliquis 5 mg BID A/P: appropriately anticoagulated and rate controlled- continue current medicine  # Diabetes S: Medication:Diet controlled- cut down on bread after last visit Lab Results  Component Value Date   HGBA1C 6.5 10/17/2022   HGBA1C 6.4 (H) 04/29/2022   HGBA1C 5.9 01/10/2022  A/P: a1c well controlled at 6.4 - 3 weeks ago- team will abstract in  #urinary frequency/dysuria- is having issues with this along with burning intermittently- wants to make sure no urinary tract infection- does tend to get better when increases fluids- update culture and start treatment only if positive   #CHF diastolic, NYHA class III -also with venous stasis edema- wears compression #hypertension S: Medication:  lasix 80 mg (BID if needed for edema), indapaminde 2.5 mg, imdur 30 mg, losartan 50 mg, spironolactone 25 mg, carvedilol 6.25 mg twice daily  Edema: some swelling after surgery- apparently toes were injured in hospital Weight gain:weight down 4 lbs Shortness of breath:  baseline is exertional dyspnea if overdose it A/P: CHF euvolemic- some edema from surgery. Hypertension stable- continue current medicines    # B12 deficiency S: Current treatment/medication (oral vs. IM): injections once a month  A/P: wants to try oral- ok with me- recheck next visit   Recommended follow up: Return in about 14 weeks (around 05/01/2023) for followup or sooner if needed.Schedule b4 you leave. Future Appointments  Date Time Provider Department Center  02/19/2023 11:00 AM Marykay Lex, MD [facility] None  03/10/2023 10:30 AM [facility] ANNUAL WELLNESS VISIT 1 [facility]  04/11/2023  7:00 AM [facility] DEVICE REMOTES [facility]  07/11/2023  7:00 AM [facility] DEVICE REMOTES [facility]  10/10/2023  7:00 AM [facility] DEVICE REMOTES [facility]  01/09/2024  7:00 AM [facility] DEVICE REMOTES [facility]     Lab/Order associations:   ICD-10-CM   1. Type 2 diabetes mellitus with peripheral neuropathy (HCC)  E11.42     2. Dysuria  R30.0 Urine Culture    3. Congestive heart failure with LV diastolic dysfunction, NYHA class 2 (HCC)  I50.30  4. Coronary artery disease involving native coronary artery of native heart with angina pectoris (HCC)  I25.119     5. PAF (paroxysmal atrial fibrillation) (HCC)  I48.0     6. Primary myxofibrosarcoma (HCC)  C49.9      No orders of the defined types were placed in this encounter.  Return precautions advised.  Tana Conch, MD

## 2023-01-23 NOTE — Patient Instructions (Addendum)
I will reach out if I hear from Dr. Myna Hidalgo   I'm ok with you trying oral B12 1000 mcg daily and rechecking next visit   No other changes today and hold off on labs to next visit  Recommended follow up: Return in about 14 weeks (around 05/01/2023) for followup or sooner if needed.Schedule b4 you leave.

## 2023-01-25 LAB — URINE CULTURE
MICRO NUMBER:: 14997533
Result:: NO GROWTH
SPECIMEN QUALITY:: ADEQUATE

## 2023-01-28 NOTE — Progress Notes (Signed)
Remote pacemaker transmission.   

## 2023-01-30 DIAGNOSIS — Z483 Aftercare following surgery for neoplasm: Secondary | ICD-10-CM | POA: Diagnosis not present

## 2023-01-30 DIAGNOSIS — C499 Malignant neoplasm of connective and soft tissue, unspecified: Secondary | ICD-10-CM | POA: Diagnosis not present

## 2023-02-03 ENCOUNTER — Encounter: Payer: Self-pay | Admitting: *Deleted

## 2023-02-03 NOTE — Progress Notes (Signed)
Reached out to [name] to introduce myself as the office RN Navigator and explain our new patient process. Reviewed the reason for their referral and scheduled their new patient appointment along with labs. Provided address and directions to the office including call back phone number. Reviewed with patient any concerns they may have or any possible barriers to attending their appointment.   Informed patient about my role as a navigator and that I will meet with them prior to their New Patient appointment and more fully discuss what services I can provide. At this time patient has no further questions or needs.    Oncology Nurse Navigator Documentation     02/03/2023    1:30 PM  Oncology Nurse Navigator Flowsheets  Abnormal Finding Date 12/06/2022  Confirmed Diagnosis Date 12/30/2022  Diagnosis Status Confirmed Diagnosis Complete  Navigator Follow Up Date: 02/05/2023  Navigator Follow Up Reason: New Patient Appointment  Navigator Location [facility]  Referral Date to RadOnc/MedOnc 01/31/2023  Navigator Encounter Type Introductory Phone Call  Patient Visit Type MedOnc  Treatment Phase Pre-Tx/Tx Discussion  Barriers/Navigation Needs Coordination of Care;Education  Education Other  Interventions Coordination of Care;Education  Acuity Level 2-Minimal Needs (1-2 Barriers Identified)  Coordination of Care Appts  Education Method Verbal  Support Groups/Services Friends and Family  Time Spent with Patient 30

## 2023-02-05 ENCOUNTER — Inpatient Hospital Stay: Payer: Medicare Other | Attending: Hematology & Oncology

## 2023-02-05 ENCOUNTER — Inpatient Hospital Stay: Payer: Medicare Other | Admitting: Licensed Clinical Social Worker

## 2023-02-05 ENCOUNTER — Encounter: Payer: Self-pay | Admitting: Hematology & Oncology

## 2023-02-05 ENCOUNTER — Inpatient Hospital Stay (HOSPITAL_BASED_OUTPATIENT_CLINIC_OR_DEPARTMENT_OTHER): Payer: Medicare Other | Admitting: Hematology & Oncology

## 2023-02-05 ENCOUNTER — Other Ambulatory Visit: Payer: Self-pay

## 2023-02-05 VITALS — BP 156/46 | HR 78 | Temp 99.3°F | Resp 16 | Ht 68.0 in | Wt 198.0 lb

## 2023-02-05 DIAGNOSIS — L02416 Cutaneous abscess of left lower limb: Secondary | ICD-10-CM

## 2023-02-05 DIAGNOSIS — C499 Malignant neoplasm of connective and soft tissue, unspecified: Secondary | ICD-10-CM | POA: Diagnosis not present

## 2023-02-05 LAB — AEROBIC/ANAEROBIC CULTURE W GRAM STAIN (SURGICAL/DEEP WOUND)

## 2023-02-05 LAB — CMP (CANCER CENTER ONLY)
ALT: 5 U/L (ref 0–44)
AST: 9 U/L — ABNORMAL LOW (ref 15–41)
Albumin: 4.1 g/dL (ref 3.5–5.0)
Alkaline Phosphatase: 73 U/L (ref 38–126)
Anion gap: 9 (ref 5–15)
BUN: 22 mg/dL (ref 8–23)
CO2: 27 mmol/L (ref 22–32)
Calcium: 9.4 mg/dL (ref 8.9–10.3)
Chloride: 100 mmol/L (ref 98–111)
Creatinine: 0.96 mg/dL (ref 0.44–1.00)
GFR, Estimated: 60 mL/min — ABNORMAL LOW (ref >60)
Glucose, Bld: 159 mg/dL — ABNORMAL HIGH (ref 70–99)
Potassium: 4.3 mmol/L (ref 3.5–5.1)
Sodium: 136 mmol/L (ref 135–145)
Total Bilirubin: 0.5 mg/dL (ref 0.3–1.2)
Total Protein: 7.1 g/dL (ref 6.5–8.1)

## 2023-02-05 LAB — CBC WITH DIFFERENTIAL (CANCER CENTER ONLY)
Abs Immature Granulocytes: 0.03 10*3/uL (ref 0.00–0.07)
Basophils Absolute: 0.1 10*3/uL (ref 0.0–0.1)
Basophils Relative: 1 %
Eosinophils Absolute: 0.3 10*3/uL (ref 0.0–0.5)
Eosinophils Relative: 3 %
HCT: 34.1 % — ABNORMAL LOW (ref 36.0–46.0)
Hemoglobin: 11.1 g/dL — ABNORMAL LOW (ref 12.0–15.0)
Immature Granulocytes: 0 %
Lymphocytes Relative: 19 %
Lymphs Abs: 1.5 10*3/uL (ref 0.7–4.0)
MCH: 29.7 pg (ref 26.0–34.0)
MCHC: 32.6 g/dL (ref 30.0–36.0)
MCV: 91.2 fL (ref 80.0–100.0)
Monocytes Absolute: 0.7 10*3/uL (ref 0.1–1.0)
Monocytes Relative: 9 %
Neutro Abs: 5.3 10*3/uL (ref 1.7–7.7)
Neutrophils Relative %: 68 %
Platelet Count: 250 10*3/uL (ref 150–400)
RBC: 3.74 MIL/uL — ABNORMAL LOW (ref 3.87–5.11)
RDW: 14 % (ref 11.5–15.5)
WBC Count: 7.9 10*3/uL (ref 4.0–10.5)
nRBC: 0 % (ref 0.0–0.2)

## 2023-02-05 LAB — LACTATE DEHYDROGENASE: LDH: 141 U/L (ref 98–192)

## 2023-02-05 NOTE — Progress Notes (Signed)
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  HPI: Ms. Tornetta is a very nice 82 year old white female.  She she is a husband of one of our patients.  She does have quite a few health issues.  She does have congestive heart failure.  She has had angioplasty in the past.  She does have atrial fibrillation.  I think she does have a pacemaker.  She noted that behind her left knee was little bit of a lump a few months ago.  This was solid.  It was nontender.  It seemed to grow.  She subsequently was seen by her family doctor.  She was then referred to an orthopedic surgeon.  She had a CT scan that was done.  Orthopedic surgeon felt that this is some that need to be seen at [facility].  She was referred to [facility].  She saw Dr. Andrey Campanile.  He is incredibly skilled.  He went ahead and took her to surgery on 12/30/2022.  He did an excision of the mass.  This was in the left popliteal fossa.  The pathology report [id number]) showed a myxofibrosarcoma.  It was grade 3.  It measured 7.5 x 5.5 x 5 cm.  The margins were positive.  There is no lymphovascular invasion.  I would have to say that the stage is stage IIIB (T2N0M0G3).  She was I think presented at their conference.  It was felt that the recommendation would be to resect to obtain negative margins.  However, the surgeon felt that this would take plastic surgery to cover the wound.  She was then referred to the [facility].  Looks that there is an infection where she had the incision.  She has yellow exudate coming out.  She currently is on some antibiotics.  We did send cultures.  She has not had any obvious staging studies.  She needs to have a CT of the chest to see if there is any metastatic disease.  She has had little bit of swelling in the left leg.  There is no  cough or shortness of breath.  She has had no nausea or vomiting.  She has had no fever.  There is been no obvious bleeding.  Currently, I would say that her performance status is probably ECOG 1.  Past Medical History:  Diagnosis Date   Adrenal adenoma    Arthritis    "fingers, right shoulder" (09/09/2017)   Atrial fibrillation (HCC) 12/2021   New diagnosis noted on pacemaker interrogation.   Back pain with radiation    Chronic Back Pain - mutliple surgeries (including tumor removal)   Bilateral edema of lower extremity    Chronic, likely related to venous stasis   Bradycardia    Pacemaker placed   CAD S/P percutaneous coronary angioplasty    a) LHC: 07/30/10. -- 3.0x63mm Integrity BMS to pRCA & 2.5x 15mm Integrity BMS mLAD(@ D2).  b) Class III-IV Angina 03/2011: LHC- ISR in LAD BMS -- prox overlapping Promus DES 2.5x62mm and PTCA of jailed D2 ostium-prox 80%. c) 02/03/12:  LHC- patent stents.  Jailed diagonal. with stable flow; d) Peri-OP NSTEMI 04/2012 - LHC in 12/'13 -    Celiac artery stenosis (HCC)    12/2016 - STENT placement   Complication of anesthesia    "used to wake up wild  years ago" (09/09/2017)   Congestive heart failure with LV diastolic dysfunction, NYHA class 2 (HCC)    06/13/10:  last 2D echo-  EF >55%, Mild TR, Mod Conc LVH - Grade 1 diastolic dysfunction (abnormal relaxation) --> LVEDP on Cath 28 mmHg & mild 2nd Pulm HTN   Diet-controlled type 2 diabetes mellitus (HCC)    Diverticulitis of colon (without mention of hemorrhage)(562.11)    Diverticulosis    Dyslipidemia, goal LDL below 70    Intolerant to statins   GERD (gastroesophageal reflux disease)    Hepatitis ~ 1957   "yellow jaundice" (01/14/2017)   Hepatomegaly    Hiatal hernia    History of blood transfusion 04/2012   "when I had a heart attack"   History of kidney stones    "I've got a stone embedded in one of my kidneys" (09/09/2017)   History of stomach ulcers "years ago"   Irritable bowel syndrome (IBS)     Labile essential hypertension    Partially related to RAS   Mesenteric artery stenosis (HCC)    95% Celiac Artery - ostial, 20-30% SMA.  Bilateral Renal A: L RA stent patent, R RA 20-30% -- Conservative Management   Mobitz type 2 second degree AV block    Status post PPM placement   NSTEMI (non-ST elevated myocardial infarction) (HCC) 04/2012   Unclear the details, apparently this was postoperative from her back surgery that she was cleared for my last saw her in June.  Reportedly had stents placed   Pancreas divisum    On pancreatic enzyme   PAT (paroxysmal atrial tachycardia)    Renal artery stenosis (HCC) 2011; 12/'13   a) Angiogram 02/03/12:  50-60%L RA stenosis, 40% R R Inferior artery; b) 12/'13: S/P L RA Stent ([facility]) 6.0 mm x 15 mm; c) Renal Duplex 10/2013: <60% L RA, <60 R RA, ~60% SMA & Celiac A.   S/P placement of cardiac pacemaker 07/2021   [employer]   Stricture and stenosis of esophagus    Thyroid nodule    Unspecified gastritis and gastroduodenitis without mention of hemorrhage   :   Past Surgical History:  Procedure Laterality Date   APPENDECTOMY     BACK SURGERY     BREAST DUCTAL SYSTEM EXCISION     right   BREAST SURGERY Right    CARDIAC CATHETERIZATION N/A 03/16/2015   Procedure: Left Heart Cath and Coronary Angiography;  Surgeon: Marykay Lex, MD;  Location: [facility] INVASIVE CV LAB;  Service: Cardiovascular; Widely patent m-dLAD stents as wellas pRCA stent.  High LVEDP, small Diag & Om vessels with moderate stenosis    Cardiac Event Monitor  01/2017   Mostly NSR with occasional sinus tachycardia and rare bradycardia. No A. fib. No PND or PSVT. Rare PACs and PVCs.   CATARACT EXTRACTION W/ INTRAOCULAR LENS  IMPLANT, BILATERAL Bilateral    CHOLECYSTECTOMY OPEN     COLONOSCOPY  03/01/2009   normal    CORONARY ANGIOPLASTY WITH STENT PLACEMENT  07/30/2010   3.0x15mm Integrity BMS to RCA and 2.5x 15mm Integrity BMS LAD.     CORONARY ANGIOPLASTY WITH STENT  PLACEMENT  03/18/2011   Cutting Balloon PTCA of D2 (jailed - 80% ostial stenosis), DES PCI of mid LAD ISR - > Promus DES 2.5 x 16 mm postdilated to 2.8 mm) covering the proximal portion of the previous stent   DOBUTAMINE STRESS ECHO  03/07/2015   [facility] (ordered for pre-op evaluation for EUS/ERCP --> abnormal EKG:  strreesss  test shhoowwed 1 mm ST segment depressions downsloping. No wall motion abnormalities at peak exercise or at rest. Diastolic dysfunction was noted but normal systolic function - EF greater than 55%. No bouts of regurgitation or stenosis. Resting hypertension with exaggerated response   ESOPHAGOGASTRODUODENOSCOPY  04/08/2012   ESOPHAGOGASTRODUODENOSCOPY (EGD) WITH ESOPHAGEAL DILATION  X 2   FRACTURE SURGERY     HAMMER TOE SURGERY Bilateral    "took bone off the top of 2nd toe on each foot"   HIP SURGERY Left    "something to do w/my back"   JOINT REPLACEMENT     KNEE SURGERY Left    "had fluid drained off it a couple times"   LEFT HEART CATH AND CORONARY ANGIOGRAPHY  07/30/2010   severe LAD-diagonal 80%, moderate to severe proximal RCA.  Mean PAP 15 mmHg.  PCWP mean 17 mmHg.  RVP 45/11 mmHg.  PAP 47/24 mmHg, mean 31 mmHg.  LVEDP 27 mmHg   LEFT HEART CATHETERIZATION WITH CORONARY ANGIOGRAM N/A 02/03/2012   Procedure: LEFT HEART CATHETERIZATION WITH CORONARY ANGIOGRAM;  Surgeon: Marykay Lex, MD;  Location: [facility] CATH LAB; widely patent LAD and RCA stents.  Patent D2 ostium.  Moderate L Renal A stenosis (56%), R Renal A 40-50% t.  LVEDP 20 mmHg.   LEFT HEART CATHETERIZATION WITH CORONARY ANGIOGRAM N/A 05/12/2014   Procedure: LEFT HEART CATHETERIZATION WITH CORONARY ANGIOGRAM;  Surgeon: Runell Gess, MD;  Location: [facility] CATH LAB;  Service: Cardiovascular: Stable CAD. Patent stents. Patent renal artery stent   LEFT HEART CATHETERIZATION WITH CORONARY ANGIOGRAM  08/2012   Peri-Op MI @ [facility] -- 40% ostial D1, patent LAD stents, 10% RCA ISR   LEFT HEART CATHETERIZATION  WITH CORONARY ANGIOGRAM   03/18/2011   70% ISR of LAD stent just after D2 (D2 has ostial 80 to 90% stenosis.  20 to 30% ISR RCA.  EDP elevated at 28 mmHg   NM MYOVIEW LTD  12/2016   LOW RISK study. No ischemia or infarction. EF 65-75%.   NM MYOVIEW LTD  01/05/2020    LOW RISK. EF 60-65%.  No EKG changes.  No infarct, no ischemia.   PACEMAKER IMPLANT N/A 07/09/2021   Procedure: PACEMAKER IMPLANT;  Surgeon: Thurmon Fair, MD;  Location: [facility] INVASIVE CV LAB;  Service: Cardiovascular;  Laterality: N/A;   PANCREAS SURGERY     "stent in my pancreas"; Dr Woodroe Chen   PERCUTANEOUS PINNING PHALANX FRACTURE OF HAND Left ~ 2013   PERIPHERAL VASCULAR BALLOON ANGIOPLASTY  09/09/2017   Procedure: PERIPHERAL VASCULAR BALLOON ANGIOPLASTY;  Surgeon: Nada Libman, MD;  Location: [facility] INVASIVE CV LAB;  Service: Cardiovascular;;  Celiac instent   PERIPHERAL VASCULAR INTERVENTION  01/14/2017   Procedure: Peripheral Vascular Intervention;  Surgeon: Nada Libman, MD;  Location: [facility] INVASIVE CV LAB;  Service: Cardiovascular;;  mesentric   RENAL ARTERY STENT Left 08/2012   @ [facility] - 6.0 mm x 15 mm   SPINE SURGERY     tumor removed 07/2010; Redo Surgery 04/2012; Sacroiliac Sgx 08/2013   TOTAL ABDOMINAL HYSTERECTOMY     TOTAL KNEE ARTHROPLASTY Left ~ 2012   TRANSTHORACIC ECHOCARDIOGRAM  07/10/2021   (Postop PPM): Normal LV size and function.  EF 60 to 65%.  No RWMA.  GR 1 DD.  Mild LA dilation.  Normal RV size and function.  Normal RAP.  Normal aortic and mitral valves.  No pericardial effusion.   VISCERAL ANGIOGRAM N/A 05/12/2014   Procedure: VISCERAL  ANGIOGRAM;  Surgeon: Runell Gess, MD;  Location: [facility] CATH LAB;  25% ostial proximal celiac artery with downward takeoff.  23% proximal SMA and 56% proximal IMA.  Left renal artery stent widely patent.  Right renal artery is 20 to 30% proximal stenosis.   VISCERAL ANGIOGRAPHY N/A 01/14/2017   Procedure: Mesenteric  Angiography;  Surgeon:  Nada Libman, MD;  Location: [facility] INVASIVE CV LAB;  Service: Cardiovascular;  Laterality: N/A;   VISCERAL ANGIOGRAPHY N/A 09/09/2017   Procedure: VISCERAL ANGIOGRAPHY;  Surgeon: Nada Libman, MD;  Location: [facility] INVASIVE CV LAB;  Service: Cardiovascular;  Laterality: N/A;  :   Current Outpatient Medications:    cephALEXin (KEFLEX) 500 MG capsule, Take 500 mg by mouth 2 (two) times daily., Disp: , Rfl:    alendronate (FOSAMAX) 70 MG tablet, Take 1 tablet (70 mg total) by mouth every 7 (seven) days. Take with a full glass of water on an empty stomach. (Patient not taking: Reported on 01/23/2023), Disp: 5 tablet, Rfl: 11   allopurinol (ZYLOPRIM) 100 MG tablet, Take 2 tablets (200 mg total) by mouth daily., Disp: 180 tablet, Rfl: 3   amiodarone (PACERONE) 200 MG tablet, TAKE 400 MG (2 TABLETS) ONE DOSE AS NEED FOR A FAST HEART RATE ,IF AFTER 12 HOURS STILL PRESENT TAKE 200 MG ( 1 TABLET) ONE DOSE FOR EACH EPISODE, Disp: 180 tablet, Rfl: 1   apixaban (ELIQUIS) 5 MG TABS tablet, Take 1 tablet (5 mg total) by mouth 2 (two) times daily., Disp: 180 tablet, Rfl: 3   Artificial Tear Solution (TEARS RENEWED OP), Apply 1 drop to eye daily as needed (dry eyes)., Disp: , Rfl:    carvedilol (COREG) 6.25 MG tablet, TAKE 1 TABLET BY MOUTH TWICE DAILY WITH A MEAL. MAY TAKE AN EXTRA TABLET DAILY IF HAVING PALPATIONS (Patient taking differently: Take 6.25 mg by mouth 2 (two) times daily with a meal.), Disp: 225 tablet, Rfl: 3   colchicine 0.6 MG tablet, Take by mouth., Disp: , Rfl:    cyanocobalamin (VITAMIN B12) 1000 MCG/ML injection, 1000 MCG INJECTION ONCE PER MONTH. PLEASE PROVIDE SYRINGES AS WELL, Disp: 3 mL, Rfl: 5   diclofenac sodium (VOLTAREN) 1 % GEL, Apply 2 g topically 4 (four) times daily as needed (pain)., Disp: , Rfl:    furosemide (LASIX) 80 MG tablet, Take 80 mg by mouth 2 (two) times daily., Disp: , Rfl:    HYDROcodone-acetaminophen (NORCO/VICODIN) 5-325 MG tablet, Take 1 tablet by mouth every 6  (six) hours as needed for moderate pain or severe pain (do not take tylenol with this. do not drive for 8 hours after taking.)., Disp: 20 tablet, Rfl: 0   hyoscyamine (ANASPAZ) 0.125 MG TBDP disintergrating tablet, Place 1 tablet (0.125 mg total) under the tongue every 6 (six) hours as needed (abdominal cramping)., Disp: 30 tablet, Rfl: 5   indapamide (LOZOL) 2.5 MG tablet, TAKE ONE TABLET BY MOUTH DAILY, 30 MINUTES BEFORE DAILY FUROSEMIDE., Disp: 90 tablet, Rfl: 2   isosorbide mononitrate (IMDUR) 30 MG 24 hr tablet, TAKE 1 TABLET BY MOUTH EVERY DAY, Disp: 90 tablet, Rfl: 3   lidocaine (XYLOCAINE) 5 % ointment, Apply 1 application. topically as needed., Disp: 35.44 g, Rfl: 0   losartan (COZAAR) 50 MG tablet, TAKE 1 TABLET BY MOUTH EVERY DAY, Disp: 90 tablet, Rfl: 3   meclizine (ANTIVERT) 12.5 MG tablet, TAKE 1 TABLET BY MOUTH THREE TIMES A DAY AS NEEDED FOR DIZZINESS, Disp: 30 tablet, Rfl: 1   nitroGLYCERIN (NITROSTAT) 0.4 MG  SL tablet, DISSOLVE 1 TABLET UNDER THE TONGUE EVERY 5 MINUTES AS NEEDED FOR CHEST PAIN, Disp: 25 tablet, Rfl: 4   spironolactone (ALDACTONE) 25 MG tablet, TAKE 1 TABLET BY MOUTH AS DIRECTED. TAKE 1 TABLET ON MONDAYS, WEDNESDAYS, AND FRIDAYS AT 6 PM., Disp: 45 tablet, Rfl: 3:  :   Allergies  Allergen Reactions   Codeine Phosphate Anaphylaxis, Shortness Of Breath and Swelling   Contrast Media [Iodinated Contrast Media] Swelling    Swelling of the face. Reaction was to ionic contrast many years ago. Patient has had non-ionic contrast many time without pre medication and has had no reaction.    Penicillin G Anaphylaxis    Patient denies allergy to penicillin.   Iodine Swelling and Rash    Many years ago. Topical iodine only.    Prednisone Other (See Comments)    Hyperactivity, insomnia   Statins Other (See Comments)    Hyperactivity   Tramadol Palpitations and Other (See Comments)    Hyperactivity  :   Family History  Problem Relation Age of Onset   Stroke Mother     Cancer Father        mets   Heart attack Father    Heart disease Father    Heart attack Brother    Diabetes Brother    Heart attack Brother    Colitis Maternal Grandfather    Cancer Daughter    Colon cancer Neg Hx    Stomach cancer Neg Hx   :   Social History   Socioeconomic History   Marital status: Married    Spouse name: Not on file   Number of children: 1   Years of education: Not on file   Highest education level: Not on file  Occupational History   Occupation: Hotel manager    Employer: Production assistant, radio FOR SELF EMPLOYED    Comment: She takes cakes and pies to sell, also canned vegetables and makes Fayrene Fearing enjoys.  Tobacco Use   Smoking status: Never   Smokeless tobacco: Never  Vaping Use   Vaping Use: Never used  Substance and Sexual Activity   Alcohol use: No   Drug use: No   Sexual activity: Not Currently  Other Topics Concern   Not on file  Social History Narrative   Married 63 years in 2023. 1 child Conne in [place].  1 grandchild in [place].       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       Hobbies: cooking/baking, loves to feed people   Social Determinants of Health   Financial Resource Strain: Low Risk  (02/26/2022)   Overall Financial Resource Strain (CARDIA)    Difficulty of Paying Living Expenses: Not hard at all  Food Insecurity: No Food Insecurity (02/26/2022)   Hunger Vital Sign    Worried About Running Out of Food in the Last Year: Never true    Ran Out of Food in the Last Year: Never true  Transportation Needs: No Transportation Needs (02/26/2022)   PRAPARE - Administrator, Civil Service (Medical): No    Lack of Transportation (Non-Medical): No  Physical Activity: Inactive (02/26/2022)   Exercise Vital Sign    Days of Exercise per Week: 0 days    Minutes of Exercise per  Session: 0 min  Stress: No Stress Concern Present (02/26/2022)  [employer] of Occupational Health - Occupational Stress Questionnaire    Feeling of Stress : Not at all  Social Connections: Moderately Integrated (02/26/2022)   Social Connection and Isolation Panel [NHANES]    Frequency of Communication with Friends and Family: More than three times a week    Frequency of Social Gatherings with Friends and Family: More than three times a week    Attends Religious Services: More than 4 times per year    Active Member of [employer] or Organizations: No    Attends Banker Meetings: Never    Marital Status: Married  Catering manager Violence: Not At Risk (02/26/2022)   Humiliation, Afraid, Rape, and Kick questionnaire    Fear of Current or Ex-Partner: No    Emotionally Abused: No    Physically Abused: No    Sexually Abused: No  :  Review of Systems  Constitutional:  Positive for malaise/fatigue.  HENT: Negative.    Eyes: Negative.   Respiratory:  Positive for shortness of breath.   Cardiovascular:  Positive for palpitations and leg swelling.  Gastrointestinal: Negative.   Genitourinary: Negative.   Musculoskeletal:  Positive for joint pain.  Skin: Negative.   Neurological:  Positive for weakness.  Endo/Heme/Allergies: Negative.   Psychiatric/Behavioral: Negative.       Exam: Vital signs show temperature of 99.3.  Pulse 70.  Blood pressure 151/65.  Weight is 198 pounds.  @IPVITALS @ Physical Exam Vitals reviewed.  HENT:     Head: Normocephalic and atraumatic.  Eyes:     Pupils: Pupils are equal, round, and reactive to light.  Cardiovascular:     Rate and Rhythm: Normal rate and regular rhythm.     Heart sounds: Normal heart sounds.     Comments: Cardiac exam is regular rate and rhythm consistent with atrial fibrillation.  The rate seems to be relatively controlled.  She has no murmurs. Pulmonary:     Effort: Pulmonary effort is normal.     Breath sounds: Normal  breath sounds.  Abdominal:     General: Bowel sounds are normal.     Palpations: Abdomen is soft.  Musculoskeletal:        General: No tenderness or deformity. Normal range of motion.     Cervical back: Normal range of motion.     Comments: Behind the left knee is the surgical scar from her excisional surgery.  There is yellowish exudate coming from the area.  There is some firmness about the incision.  There may be a little bit of a lump adjacent to the incision.  Lymphadenopathy:     Cervical: No cervical adenopathy.  Skin:    General: Skin is warm and dry.     Findings: No erythema or rash.  Neurological:     Mental Status: She is alert and oriented to person, place, and time.  Psychiatric:        Behavior: Behavior normal.        Thought Content: Thought content normal.        Judgment: Judgment normal.     Recent Labs    02/05/23 1053  WBC 7.9  HGB 11.1*  HCT 34.1*  PLT 250    Recent Labs    02/05/23 1053  NA 136  K 4.3  CL 100  CO2 27  GLUCOSE 159*  BUN 22  CREATININE 0.96  CALCIUM 9.4    Blood smear review: None  Pathology: See above    Assessment and Plan: Ms. James is a  very charming 82 year old white female.  She has a very interesting problem.  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  She had this excised.  However, the margins were positive.  This is high-grade.  She clearly is at significant risk for recurrence.  It looks like she has an infection where she has had the incision.  We are trying to get hold of the surgeon at [facility] to let him know so that she can get back to see them to see if they can help this.  I do not know if the incision needs to be opened up and cleaned out.  She clearly needs to have some staging done.  We need to have a CT scan of the chest done.  Will get this done next week.  I spoke to our Radiation Oncologist.  He would be willing to give her radiation therapy.  He would really prefer to have  negative margins but given the overall picture with respect to her age and her other health problems and the fact that another surgery would be very challenging for her, he will give her radiation.  However, we really have to get the infection sorted out.  I will speak to Dr. Andrey Campanile.  I will let him know what is going on.  As far as getting her back here, this will really depend on what happens when she goes to [facility] to have the infection looked at and radiation.  She does

## 2023-02-05 NOTE — Progress Notes (Signed)
[facility] Clinical Social Work  Initial Assessment   [name] is a 82 y.o. year old female accompanied by spouse. Clinical Social Work was referred by medical provider for assessment of psychosocial needs.   SDOH (Social Determinants of Health) assessments performed: Yes   SDOH Screenings   Food Insecurity: No Food Insecurity (02/26/2022)  Housing: Low Risk  (02/26/2022)  Transportation Needs: No Transportation Needs (02/26/2022)  Depression (PHQ2-9): Low Risk  (10/17/2022)  Financial Resource Strain: Low Risk  (02/26/2022)  Physical Activity: Inactive (02/26/2022)  Social Connections: Moderately Integrated (02/26/2022)  Stress: No Stress Concern Present (02/26/2022)  Tobacco Use: Low Risk  (02/05/2023)     Distress Screen completed: No     No data to display            Family/Social Information:  Housing Arrangement: patient lives with her husband, [name].   Family members/support persons in your life? 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  Income source: Actor concerns: Yes, current concerns Type of concern: General financial concerns due to fixed income. 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 Services Currently in place:  Medicare  Coping/ Adjustment to diagnosis: Patient understands treatment plan and what happens next? Patient meeting with Dr. Myna Herring today to discuss treatment plan. Concerns about diagnosis and/or treatment: Overwhelmed by information Patient reported stressors: Therapist, art and/or priorities: Family Patient enjoys time with family/ friends Current coping skills/ strengths: Active sense of humor , Capable of independent living , Communication skills , General fund of knowledge , and Supportive family/friends     SUMMARY: Current SDOH Barriers:  Financial constraints  related to fixed income.  Clinical Social Work Clinical Goal(s):  Explore community resource options for unmet needs related to:  Financial Strain   Interventions: Discussed common feeling and emotions when being diagnosed with cancer, and the importance of support during treatment Informed patient of the support team roles and support services at [facility] Provided CSW contact information and encouraged patient to call with any questions or concerns Provided patient with information about the [employer].  She would like CSW to make a referral.      Follow Up Plan: Patient will contact CSW with any support or resource needs Patient verbalizes understanding of plan: Yes    Tricia Baseman, LCSW Clinical Social Worker [facility]

## 2023-02-06 ENCOUNTER — Telehealth: Payer: Self-pay | Admitting: Radiation Oncology

## 2023-02-06 LAB — AEROBIC/ANAEROBIC CULTURE W GRAM STAIN (SURGICAL/DEEP WOUND): Gram Stain: NONE SEEN

## 2023-02-06 NOTE — Telephone Encounter (Signed)
Left message for patient to call back to schedule consult per 6/6 referral.

## 2023-02-07 ENCOUNTER — Encounter: Payer: Self-pay | Admitting: *Deleted

## 2023-02-07 LAB — AEROBIC/ANAEROBIC CULTURE W GRAM STAIN (SURGICAL/DEEP WOUND)

## 2023-02-07 NOTE — Progress Notes (Signed)
Patient was seen by Dr Myna Hidalgo for new diagnosis of myxofibrosarcoma. She will need CT scan to complete staging and referral to RadOnc for consideration of treatment.   CT scheduled for 02/13/2023 New patient appointment with RadOnc scheduled 02/19/2023.  Oncology Nurse Navigator Documentation     02/07/2023   11:30 AM  Oncology Nurse Navigator Flowsheets  Planned Course of Treatment Radiation  Navigator Follow Up Date: 02/13/2023  Navigator Follow Up Reason: Scan Review  Navigator Location [facility]  Navigator Encounter Type Appt/Treatment Plan Review  Patient Visit Type MedOnc  Treatment Phase Pre-Tx/Tx Discussion  Barriers/Navigation Needs Coordination of Care;Education  Interventions None Required  Acuity Level 2-Minimal Needs (1-2 Barriers Identified)  Support Groups/Services Friends and Family  Time Spent with Patient 15

## 2023-02-10 LAB — AEROBIC/ANAEROBIC CULTURE W GRAM STAIN (SURGICAL/DEEP WOUND): Culture: NORMAL

## 2023-02-10 NOTE — Progress Notes (Signed)
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 Nonspecific lobulated soft tissue mass within the subcutaneous fat posterior to the distal femur. While this could be benign, a sarcoma cannot be excluded. MRI without and with contrast may be helpful for further evaluation. Reportedly, the patient has a pacemaker, but has undergone MRI as recently as 10/16/2016. If MRI cannot be performed, recommend tissue sampling. 2. No other masses are seen surrounding the knee. 3. No acute osseous findings. 4. Status post left total knee arthroplasty without evidence of hardware complication.   Pathology results:    Past/Anticipated chemotherapy by surgeon, if any:  Tricia Byes, MD  12-30-22 Procedures  PR EXCISON TUMOR SOFT TISSUE THIGH/KNEE SUBQ 3+CM  EXCISION soft tissue tumor left popliteal fossa 1 hour lateral   Past/Anticipated chemotherapy by medical oncology, if any:  Dr. Myna Herring on 02-05-23 She does have quite a few health issues. She does have congestive heart failure. She has had angioplasty in the past. She does have atrial fibrillation. I think she does have a pacemaker.   Assessment and Plan: [name] is a very charming 82 year old white female.  She has a very interesting problem.  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  She had this excised.  However, the margins were positive.   This is high-grade.  She clearly is at significant risk for recurrence.   It looks like she has an infection where she has had the incision.  We are trying to get hold of the surgeon at [facility] to let him know so that she can get back to see them to see if they can help this.  I do not know if the incision needs to be opened up and cleaned out.   She clearly needs to have some staging done.  We need to have a CT scan of the chest done.  Will get this done next week.   I spoke to our Radiation Oncologist.  He would be willing  to give her radiation therapy.  He would really prefer to have negative margins but given the overall picture with respect to her age and her other health problems and the fact that another surgery would be very challenging for her, he will give her radiation.   However, we really have to get the infection sorted out.  I will speak to Dr. Andrey Campanile.  I will let him know what is going on.   As far as getting her back here, this will really depend on what happens when she goes to [facility] to have the infection looked at and radiation.  She does         Patient's main complaints related to symptomatic tumor(s) are:  (Per Dr. Gustavo Lah  note on 02-05-23) She noted that behind her left knee was little bit of a lump a few months ago. This was solid. It was nontender. It seemed to grow. She subsequently was seen by her family doctor. She was then referred to an orthopedic surgeon. She had a CT scan that was done. Orthopedic surgeon felt that this is some that need to be seen at [facility].   Pain on a scale of 0-10 is:  Reports intermittent pain to left leg. Rating 3/10 Pain worsens at night.    Ambulatory status? Walker? Wheelchair?: Ambulatory with single point cane.   SAFETY ISSUES: Prior radiation? No Pacemaker/ICD? Yes-Pacemaker  Possible current pregnancy? no Is the patient on methotrexate?  no  Additional Complaints / other details:    BP (!) 151/51 (BP Location: Left Arm, Patient Position: Sitting, Cuff Size: Large)   Pulse 88   Temp 97.8 F (36.6 C)   Resp 20   Ht 5\' 8"  (1.727 m)   Wt 198 lb 6.4 oz (90 kg)   SpO2 99%   BMI 30.17 kg/m

## 2023-02-11 DIAGNOSIS — T8131XD Disruption of external operation (surgical) wound, not elsewhere classified, subsequent encounter: Secondary | ICD-10-CM | POA: Diagnosis not present

## 2023-02-13 ENCOUNTER — Ambulatory Visit (HOSPITAL_BASED_OUTPATIENT_CLINIC_OR_DEPARTMENT_OTHER)
Admission: RE | Admit: 2023-02-13 | Discharge: 2023-02-13 | Disposition: A | Discharge status: Home / Self Care | Payer: Medicare Other | Source: Ambulatory Visit | Attending: Hematology & Oncology | Admitting: Hematology & Oncology

## 2023-02-13 ENCOUNTER — Ambulatory Visit: Admitting: Urology

## 2023-02-13 DIAGNOSIS — R911 Solitary pulmonary nodule: Secondary | ICD-10-CM | POA: Diagnosis not present

## 2023-02-13 DIAGNOSIS — C499 Malignant neoplasm of connective and soft tissue, unspecified: Secondary | ICD-10-CM | POA: Insufficient documentation

## 2023-02-17 NOTE — Progress Notes (Signed)
Spoke with Cherre about the [employer], she will be in this week to sign and bring in proof of income.

## 2023-02-18 NOTE — Progress Notes (Signed)
Radiation Oncology         [phone] ________________________________  Initial Outpatient Consultation  Name: [name] MRN: [id number]  Date: 02/19/2023  DOB: December 25, 1940  OZ:[id number], Aldine Contes, MD  Josph Macho, MD   REFERRING PHYSICIAN: Josph Macho, MD  DIAGNOSIS: The primary encounter diagnosis was Sarcoma of lower extremity, left (HCC). A diagnosis of Primary myxofibrosarcoma (HCC) was also pertinent to this visit.  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 female who is accompanied by her husband. she is seen as a courtesy of Dr. Myna Hidalgo for an opinion concerning radiation therapy as part of management for her recently diagnosed myxofibrosarcoma of the left knee. She has a history notable for a total left knee replacement. She also has a pacemaker.   The patient presented to her PCP this past March 2024 with a knot behind her left knee which she noticed that week. She also reported having associated cramping and pain in the same area for several weeks prior to noticing the knot. She was then referred to sports medicine that same week and had an x-ray performed on 11/12/22 which showed no acute findings and intact appearing hardware. However, an ultrasound was performed in clinic on 11/13/22 which revealed a large hyperechoic cystic mass in the posterior medial knee. Given that she is on a blood thinner (eliquis), her mass was thought to likely represent a hematoma resulting from a bleed into the posterior medial knee.   Based on the location of the mass, sports medicine initially recommended proceeding with watchful waiting and compression followed by reassessment in a month to determine the need for aspiration.   However, the patient returned to sports medicine on 11/22/22 with increasing tenderness to the area and due to an interval increase in size of  the mass. Aspiration was attempted at that time however no fluid was produced. In light of this, the differential considerations including abscess and hematoma were deemed unlikely.   Subsequently, the a CT of the left knee was performed on 11/27/22 which demonstrated a nonspecific lobulated soft tissue mass within the subcutaneous fat posterior to the distal femur. No other masses or osseous findings were appreciated. (An MRI was recommended, however the patient has a pacemaker and is unable to undergo this).   She was promptly referred to Dr. Andrey Campanile, [facility] ortho-surgery for resection of the left knee (left popliteal fossa) mass, performed on 12/30/22. Pathology showed findings consistent with grade 3 myxofibrosarcoma measuring 7.5 cm in the greatest extent. All resection margins were positive except for the superior soft tissue margin (negative for LVI).   Post-operatively, the patient developed some pain and drainage from incision site. She was started on an abx course on 01/30/23 per ortho and provided with a daily wound care regimen. Per her most recent visit with ortho on 02/11/23, the patient reported increasing pain and drainage from the incision site despite antibiotics. Her antibiotic was subsequently changed to Augmentin   Upon record review, it is noted that her case was presented in conference at [facility]. The initial recommendation was to proceed with re-excision to obtain negative margins. However, general surgery noted that she would also require plastic surgery to cover the wound and obtain this. In light of her age and comorbidities, re-excision was not pursued.   Subsequently, the patient was referred to Dr. Myna Hidalgo on 02/05/23. Dr. Myna Hidalgo recommends radiation therapy  to the left knee which which we will discuss in detail today.   She recently completed a CT scan of the chest to complete her staging workup.  This showed no evidence of metastatic disease to the chest.  On review  of systems today that patient reports some swelling in her left lower extremity since surgery.  She does ambulate with the assistance of a cane.  She denies any significant pain in the popliteal fossa.  Her husband who changes the dressings daily he has not noticed any significant drainage over the past several days.  PREVIOUS RADIATION THERAPY: No  PAST MEDICAL HISTORY:  Past Medical History:  Diagnosis Date   Adrenal adenoma    Arthritis    "fingers, right shoulder" (09/09/2017)   Atrial fibrillation (HCC) 12/2021   New diagnosis noted on pacemaker interrogation.   Back pain with radiation    Chronic Back Pain - mutliple surgeries (including tumor removal)   Bilateral edema of lower extremity    Chronic, likely related to venous stasis   Bradycardia    Pacemaker placed   CAD S/P percutaneous coronary angioplasty    a) LHC: 07/30/10. -- 3.0x75mm Integrity BMS to pRCA & 2.5x 15mm Integrity BMS mLAD(@ D2).  b) Class III-IV Angina 03/2011: LHC- ISR in LAD BMS -- prox overlapping Promus DES 2.5x48mm and PTCA of jailed D2 ostium-prox 80%. c) 02/03/12:  LHC- patent stents.  Jailed diagonal. with stable flow; d) Peri-OP NSTEMI 04/2012 - LHC in 12/'13 -    Celiac artery stenosis (HCC)    12/2016 - STENT placement   Complication of anesthesia    "used to wake up wild years ago" (09/09/2017)   Congestive heart failure with LV diastolic dysfunction, NYHA class 2 (HCC)    06/13/10:  last 2D echo-  EF >55%, Mild TR, Mod Conc LVH - Grade 1 diastolic dysfunction (abnormal relaxation) --> LVEDP on Cath 28 mmHg & mild 2nd Pulm HTN   Diet-controlled type 2 diabetes mellitus (HCC)    Diverticulitis of colon (without mention of hemorrhage)(562.11)    Diverticulosis    Dyslipidemia, goal LDL below 70    Intolerant to statins   GERD (gastroesophageal reflux disease)    Hepatitis ~ 1957   "yellow jaundice" (01/14/2017)   Hepatomegaly    Hiatal hernia    History of blood transfusion 04/2012   "when I had a heart  attack"   History of kidney stones    "I've got a stone embedded in one of my kidneys" (09/09/2017)   History of stomach ulcers "years ago"   Irritable bowel syndrome (IBS)    Labile essential hypertension    Partially related to RAS   Mesenteric artery stenosis (HCC)    95% Celiac Artery - ostial, 20-30% SMA.  Bilateral Renal A: L RA stent patent, R RA 20-30% -- Conservative Management   Mobitz type 2 second degree AV block    Status post PPM placement   NSTEMI (non-ST elevated myocardial infarction) (HCC) 04/2012   Unclear the details, apparently this was postoperative from her back surgery that she was cleared for my last saw her in June.  Reportedly had stents placed   Pancreas divisum    On pancreatic enzyme   PAT (paroxysmal atrial tachycardia)    Renal artery stenosis (HCC) 2011; 12/'13   a) Angiogram 02/03/12:  50-60%L RA stenosis, 40% R R Inferior artery; b) 12/'13: S/P L RA Stent ([facility]) 6.0 mm x 15 mm; c) Renal Duplex 10/2013: <60%  L RA, <60 R RA, ~60% SMA & Celiac A.   S/P placement of cardiac pacemaker 07/2021   Medtr[employer]   Stricture and stenosis of esophagus    Thyroid nodule    Unspecified gastritis and gastroduodenitis without mention of hemorrhage     PAST SURGICAL HISTORY: Past Surgical History:  Procedure Laterality Date   APPENDECTOMY     BACK SURGERY     BREAST DUCTAL SYSTEM EXCISION     right   BREAST SURGERY Right    CARDIAC CATHETERIZATION N/A 03/16/2015   Procedure: Left Heart Cath and Coronary Angiography;  Surgeon: Marykay Lex, MD;  Location: [facility] INVASIVE CV LAB;  Service: Cardiovascular; Widely patent m-dLAD stents as wellas pRCA stent.  High LVEDP, small Diag & Om vessels with moderate stenosis    Cardiac Event Monitor  01/2017   Mostly NSR with occasional sinus tachycardia and rare bradycardia. No A. fib. No PND or PSVT. Rare PACs and PVCs.   CATARACT EXTRACTION W/ INTRAOCULAR LENS  IMPLANT, BILATERAL Bilateral    CHOLECYSTECTOMY OPEN      COLONOSCOPY  03/01/2009   normal    CORONARY ANGIOPLASTY WITH STENT PLACEMENT  07/30/2010   3.0x80mm Integrity BMS to RCA and 2.5x 15mm Integrity BMS LAD.     CORONARY ANGIOPLASTY WITH STENT PLACEMENT  03/18/2011   Cutting Balloon PTCA of D2 (jailed - 80% ostial stenosis), DES PCI of mid LAD ISR - > Promus DES 2.5 x 16 mm postdilated to 2.8 mm) covering the proximal portion of the previous stent   DOBUTAMINE STRESS ECHO  03/07/2015   [facility] (ordered for pre-op evaluation for EUS/ERCP --> abnormal EKG:  strreesss test shhoowwed 1 mm ST segment depressions downsloping. No wall motion abnormalities at peak exercise or at rest. Diastolic dysfunction was noted but normal systolic function - EF greater than 55%. No bouts of regurgitation or stenosis. Resting hypertension with exaggerated response   ESOPHAGOGASTRODUODENOSCOPY  04/08/2012   ESOPHAGOGASTRODUODENOSCOPY (EGD) WITH ESOPHAGEAL DILATION  X 2   FRACTURE SURGERY     HAMMER TOE SURGERY Bilateral    "took bone off the top of 2nd toe on each foot"   HIP SURGERY Left    "something to do w/my back"   JOINT REPLACEMENT     KNEE SURGERY Left    "had fluid drained off it a couple times"   LEFT HEART CATH AND CORONARY ANGIOGRAPHY  07/30/2010   severe LAD-diagonal 80%, moderate to severe proximal RCA.  Mean PAP 15 mmHg.  PCWP mean 17 mmHg.  RVP 45/11 mmHg.  PAP 47/24 mmHg, mean 31 mmHg.  LVEDP 27 mmHg   LEFT HEART CATHETERIZATION WITH CORONARY ANGIOGRAM N/A 02/03/2012   Procedure: LEFT HEART CATHETERIZATION WITH CORONARY ANGIOGRAM;  Surgeon: Marykay Lex, MD;  Location: [facility] CATH LAB; widely patent LAD and RCA stents.  Patent D2 ostium.  Moderate L Renal A stenosis (56%), R Renal A 40-50% t.  LVEDP 20 mmHg.   LEFT HEART CATHETERIZATION WITH CORONARY ANGIOGRAM N/A 05/12/2014   Procedure: LEFT HEART CATHETERIZATION WITH CORONARY ANGIOGRAM;  Surgeon: Runell Gess, MD;  Location: [facility] CATH LAB;  Service: Cardiovascular: Stable CAD. Patent stents. Patent  renal artery stent   LEFT HEART CATHETERIZATION WITH CORONARY ANGIOGRAM  08/2012   Peri-Op MI @ [facility] -- 40% ostial D1, patent LAD stents, 10% RCA ISR   LEFT HEART CATHETERIZATION WITH CORONARY ANGIOGRAM   03/18/2011   70% ISR of LAD stent just after D2 (D2 has ostial 80  to 90% stenosis.  20 to 30% ISR RCA.  EDP elevated at 28 mmHg   NM MYOVIEW LTD  12/2016   LOW RISK study. No ischemia or infarction. EF 65-75%.   NM MYOVIEW LTD  01/05/2020    LOW RISK. EF 60-65%.  No EKG changes.  No infarct, no ischemia.   PACEMAKER IMPLANT N/A 07/09/2021   Procedure: PACEMAKER IMPLANT;  Surgeon: Thurmon Fair, MD;  Location: [facility] INVASIVE CV LAB;  Service: Cardiovascular;  Laterality: N/A;   PANCREAS SURGERY     "stent in my pancreas"; Dr Woodroe Chen   PERCUTANEOUS PINNING PHALANX FRACTURE OF HAND Left ~ 2013   PERIPHERAL VASCULAR BALLOON ANGIOPLASTY  09/09/2017   Procedure: PERIPHERAL VASCULAR BALLOON ANGIOPLASTY;  Surgeon: Nada Libman, MD;  Location: [facility] INVASIVE CV LAB;  Service: Cardiovascular;;  Celiac instent   PERIPHERAL VASCULAR INTERVENTION  01/14/2017   Procedure: Peripheral Vascular Intervention;  Surgeon: Nada Libman, MD;  Location: [facility] INVASIVE CV LAB;  Service: Cardiovascular;;  mesentric   RENAL ARTERY STENT Left 08/2012   @ [facility] - 6.0 mm x 15 mm   SPINE SURGERY     tumor removed 07/2010; Redo Surgery 04/2012; Sacroiliac Sgx 08/2013   TOTAL ABDOMINAL HYSTERECTOMY     TOTAL KNEE ARTHROPLASTY Left ~ 2012   TRANSTHORACIC ECHOCARDIOGRAM  07/10/2021   (Postop PPM): Normal LV size and function.  EF 60 to 65%.  No RWMA.  GR 1 DD.  Mild LA dilation.  Normal RV size and function.  Normal RAP.  Normal aortic and mitral valves.  No pericardial effusion.   VISCERAL ANGIOGRAM N/A 05/12/2014   Procedure: VISCERAL ANGIOGRAM;  Surgeon: Runell Gess, MD;  Location: [facility] CATH LAB;  25% ostial proximal celiac artery with downward takeoff.  23% proximal SMA and 56%  proximal IMA.  Left renal artery stent widely patent.  Right renal artery is 20 to 30% proximal stenosis.   VISCERAL ANGIOGRAPHY N/A 01/14/2017   Procedure: Mesenteric  Angiography;  Surgeon: Nada Libman, MD;  Location: [facility] INVASIVE CV LAB;  Service: Cardiovascular;  Laterality: N/A;   VISCERAL ANGIOGRAPHY N/A 09/09/2017   Procedure: VISCERAL ANGIOGRAPHY;  Surgeon: Nada Libman, MD;  Location: [facility] INVASIVE CV LAB;  Service: Cardiovascular;  Laterality: N/A;    FAMILY HISTORY:  Family History  Problem Relation Age of Onset   Stroke Mother    Cancer Father        mets   Heart attack Father    Heart disease Father    Heart attack Brother    Diabetes Brother    Heart attack Brother    Colitis Maternal Grandfather    Cancer Daughter    Colon cancer Neg Hx    Stomach cancer Neg Hx     SOCIAL HISTORY:  Social History   Tobacco Use   Smoking status: Never   Smokeless tobacco: Never  Vaping Use   Vaping Use: Never used  Substance Use Topics   Alcohol use: No   Drug use: No    ALLERGIES:  Allergies  Allergen Reactions   Codeine Phosphate Anaphylaxis, Shortness Of Breath and Swelling   Contrast Media [Iodinated Contrast Media] Swelling    Swelling of the face. Reaction was to ionic contrast many years ago. Patient has had non-ionic contrast many time without pre medication and has had no reaction.    Statins Other (See Comments)    Hyperactivity   Tramadol Palpitations and Other (See Comments)  Hyperactivity    MEDICATIONS:  Current Outpatient Medications  Medication Sig Dispense Refill   alendronate (FOSAMAX) 70 MG tablet Take 1 tablet (70 mg total) by mouth every 7 (seven) days. Take with a full glass of water on an empty stomach. 5 tablet 11   allopurinol (ZYLOPRIM) 100 MG tablet Take 2 tablets (200 mg total) by mouth daily. 180 tablet 3   amiodarone (PACERONE) 200 MG tablet TAKE 400 MG (2 TABLETS) ONE DOSE AS NEED FOR A FAST HEART RATE ,IF AFTER 12 HOURS STILL  PRESENT TAKE 200 MG ( 1 TABLET) ONE DOSE FOR EACH EPISODE 180 tablet 1   apixaban (ELIQUIS) 5 MG TABS tablet Take 1 tablet (5 mg total) by mouth 2 (two) times daily. 180 tablet 3   Artificial Tear Solution (TEARS RENEWED OP) Apply 1 drop to eye daily as needed (dry eyes).     carvedilol (COREG) 6.25 MG tablet TAKE 1 TABLET BY MOUTH TWICE DAILY WITH A MEAL. MAY TAKE AN EXTRA TABLET DAILY IF HAVING PALPATIONS (Patient taking differently: Take 6.25 mg by mouth 2 (two) times daily with a meal.) 225 tablet 3   colchicine 0.6 MG tablet Take by mouth.     cyanocobalamin (VITAMIN B12) 1000 MCG/ML injection 1000 MCG INJECTION ONCE PER MONTH. PLEASE PROVIDE SYRINGES AS WELL 3 mL 5   diclofenac sodium (VOLTAREN) 1 % GEL Apply 2 g topically 4 (four) times daily as needed (pain).     furosemide (LASIX) 80 MG tablet Take 80 mg by mouth 2 (two) times daily.     HYDROcodone-acetaminophen (NORCO/VICODIN) 5-325 MG tablet Take 1 tablet by mouth every 6 (six) hours as needed for moderate pain or severe pain (do not take tylenol with this. do not drive for 8 hours after taking.). 20 tablet 0   hyoscyamine (ANASPAZ) 0.125 MG TBDP disintergrating tablet Place 1 tablet (0.125 mg total) under the tongue every 6 (six) hours as needed (abdominal cramping). 30 tablet 5   indapamide (LOZOL) 2.5 MG tablet TAKE ONE TABLET BY MOUTH DAILY, 30 MINUTES BEFORE DAILY FUROSEMIDE. 90 tablet 2   isosorbide mononitrate (IMDUR) 30 MG 24 hr tablet Take 1 tablet (30 mg total) by mouth daily. May take an extra dose if you use Nitroglycerin  sublingual tablet for 2 days afterwards 120 tablet 3   lidocaine (XYLOCAINE) 5 % ointment Apply 1 application. topically as needed. 35.44 g 0   losartan (COZAAR) 50 MG tablet TAKE 1 TABLET BY MOUTH EVERY DAY 90 tablet 3   meclizine (ANTIVERT) 12.5 MG tablet TAKE 1 TABLET BY MOUTH THREE TIMES A DAY AS NEEDED FOR DIZZINESS 30 tablet 1   nitroGLYCERIN (NITROSTAT) 0.4 MG SL tablet DISSOLVE 1 TABLET UNDER THE  TONGUE EVERY 5 MINUTES AS NEEDED FOR CHEST PAIN 25 tablet 6   spironolactone (ALDACTONE) 25 MG tablet TAKE 1 TABLET BY MOUTH AS DIRECTED. TAKE 1 TABLET ON MONDAYS, WEDNESDAYS, AND FRIDAYS AT 6 PM. 45 tablet 3   No current facility-administered medications for this encounter.    REVIEW OF SYSTEMS:  A 10+ POINT REVIEW OF SYSTEMS WAS OBTAINED including neurology, dermatology, psychiatry, cardiac, respiratory, lymph, extremities, GI, GU, musculoskeletal, constitutional, reproductive, HEENT.    PHYSICAL EXAM:  height is 5\' 8"  (1.727 m) and weight is 198 lb 6.4 oz (90 kg). Her temperature is 97.8 F (36.6 C). Her blood pressure is 151/51 (abnormal) and her pulse is 88. Her respiration is 20 and oxygen saturation is 99%.   General: Alert and oriented, in no  acute distress HEENT: Head is normocephalic. Extraocular movements are intact.  Neck: Neck is supple, no palpable cervical or supraclavicular lymphadenopathy. Heart: Regular in rate and rhythm with no murmurs, rubs, or gallops. Chest: Clear to auscultation bilaterally, with no rhonchi, wheezes, or rales. Abdomen: Soft, nontender, nondistended, with no rigidity or guarding. Extremities: No cyanosis or edema. Lymphatics: see Neck Exam Skin: No concerning lesions. Musculoskeletal: symmetric strength and muscle tone throughout. Neurologic: Cranial nerves II through XII are grossly intact. No obvious focalities. Speech is fluent. Coordination is intact. Psychiatric: Judgment and insight are intact. Affect is appropriate. Examination of the left lower extremity reveals a well-healing scar in the region of the popliteal fossa.  Slight yellowish drainage noted along the scar without signs of  drainage swelling or infection.  Surgical scars well-approximated.  Examination of the anterior portion of the left knee shows scars from her prior knee replacement.  ECOG = 1  0 - Asymptomatic (Fully active, able to carry on all predisease activities without  restriction)  1 - Symptomatic but completely ambulatory (Restricted in physically strenuous activity but ambulatory and able to carry out work of a light or sedentary nature. For example, light housework, office work)  2 - Symptomatic, <50% in bed during the day (Ambulatory and capable of all self care but unable to carry out any work activities. Up and about more than 50% of waking hours)  3 - Symptomatic, >50% in bed, but not bedbound (Capable of only limited self-care, confined to bed or chair 50% or more of waking hours)  4 - Bedbound (Completely disabled. Cannot carry on any self-care. Totally confined to bed or chair)  5 - Death   Santiago Glad MM, Creech RH, Tormey DC, et al. [phone]). "Toxicity and response criteria of the [facility]". Am. Evlyn Clines. Oncol. 5 (6): 649-55  LABORATORY DATA:  Lab Results  Component Value Date   WBC 7.9 02/05/2023   HGB 11.1 (L) 02/05/2023   HCT 34.1 (L) 02/05/2023   MCV 91.2 02/05/2023   PLT 250 02/05/2023   NEUTROABS 5.3 02/05/2023   Lab Results  Component Value Date   NA 136 02/05/2023   K 4.3 02/05/2023   CL 100 02/05/2023   CO2 27 02/05/2023   GLUCOSE 159 (H) 02/05/2023   BUN 22 02/05/2023   CREATININE 0.96 02/05/2023   CALCIUM 9.4 02/05/2023      RADIOGRAPHY: CT Chest Wo Contrast  Result Date: 02/19/2023 CLINICAL DATA:  History of leg sarcoma; * Tracking Code: BO * EXAM: CT CHEST WITHOUT CONTRAST TECHNIQUE: Multidetector CT imaging of the chest was performed following the standard protocol without IV contrast. RADIATION DOSE REDUCTION: This exam was performed according to the departmental dose-optimization program which includes automated exposure control, adjustment of the mA and/or kV according to patient size and/or use of iterative reconstruction technique. COMPARISON:  CT chest, abdomen and pelvis dated June 03, 2022 FINDINGS: Cardiovascular: Normal heart size. No pericardial effusion. Normal caliber thoracic aorta  with severe calcified plaque. Severe three-vessel coronary artery calcifications. Left chest wall dual lead pacer with leads in the right atrium and right ventricle. Mediastinum/Nodes: Small hiatal hernia. Thyroid is unremarkable. No enlarged lymph nodes seen in the chest. Lungs/Pleura: Central airways are patent. No consolidation, pleural effusion or pneumothorax. Stable tiny subpleural nodule of the left lower lobe measuring 3 mm on series 4, image 69. No new pulmonary nodules. Upper Abdomen: Pneumobilia, similar to prior exam. No acute abnormality. Musculoskeletal: Posterior fusion hardware of the lower thoracic  and upper lumbar spine. No aggressive appearing osseous lesions. IMPRESSION: 1. No evidence of metastatic disease in the chest. 2. Stable tiny subpleural nodule of the left lower lobe measuring 3 mm. Recommend attention on follow-up. 3. Severe three-vessel coronary artery calcifications. 4. Aortic Atherosclerosis (ICD10-I70.0). Electronically Signed   By: Allegra Lai M.D.   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  We discussed that ideally we would like to have clear surgical margins prior to proceeding with postoperative radiation therapy.  In order to obtain this situation the patient will require  additional surgery with plastic surgery involved to cover the defect with a skin graft.  This was then further delay initiation of radiation therapy with prolonged healing with skin graft..  Given the patient's age and comorbidities additional surgery with skin graft was not recommended by surgical oncology.  Today, I talked to the patient and her husband about the findings and work-up thus far.  We discussed the natural history of high-grade extremity sarcomas and general treatment, highlighting the role of radiotherapy in the management.  We  discussed the available radiation techniques, and focused on the details of logistics and delivery.  We reviewed the anticipated acute and late sequelae associated with radiation in this setting.  The patient was encouraged to ask questions that I answered to the best of my ability.  A patient consent form was discussed and signed.  We retained a copy for our records.  The patient would like to proceed with radiation and will be scheduled for CT simulation.  PLAN: She will return next week for CT simulation.  Treatments to begin approximately a week later.  Anticipate 7-1/2 weeks of radiation therapy.   60 minutes of total time was spent for this patient encounter, including preparation, face-to-face counseling with the patient and coordination of care, physical exam, and documentation of the encounter.   ------------------------------------------------  Billie Lade, PhD, MD  This document serves as a record of services personally performed by Antony Blackbird, MD. It was created on his behalf by Neena Rhymes, a trained medical scribe. The creation of this record is based on the scribe's personal observations and the provider's statements to them. This document has been checked and approved by the attending provider.

## 2023-02-19 ENCOUNTER — Encounter: Payer: Self-pay | Admitting: Cardiology

## 2023-02-19 ENCOUNTER — Other Ambulatory Visit: Payer: Self-pay

## 2023-02-19 ENCOUNTER — Encounter: Payer: Self-pay | Admitting: Cardiovascular Disease

## 2023-02-19 ENCOUNTER — Encounter: Payer: Self-pay | Admitting: Radiation Oncology

## 2023-02-19 ENCOUNTER — Ambulatory Visit
Admission: RE | Admit: 2023-02-19 | Discharge: 2023-02-19 | Disposition: A | Discharge status: Home / Self Care | Payer: Medicare Other | Source: Ambulatory Visit | Attending: Radiation Oncology | Admitting: Radiation Oncology

## 2023-02-19 ENCOUNTER — Ambulatory Visit (INDEPENDENT_AMBULATORY_CARE_PROVIDER_SITE_OTHER): Payer: Medicare Other | Admitting: Cardiology

## 2023-02-19 VITALS — BP 151/51 | HR 88 | Temp 97.8°F | Resp 20 | Ht 68.0 in | Wt 198.4 lb

## 2023-02-19 VITALS — BP 124/56 | HR 84 | Ht 68.0 in | Wt 196.8 lb

## 2023-02-19 DIAGNOSIS — I2510 Atherosclerotic heart disease of native coronary artery without angina pectoris: Secondary | ICD-10-CM | POA: Insufficient documentation

## 2023-02-19 DIAGNOSIS — M940 Chondrocostal junction syndrome [Tietze]: Secondary | ICD-10-CM | POA: Insufficient documentation

## 2023-02-19 DIAGNOSIS — I87302 Chronic venous hypertension (idiopathic) without complications of left lower extremity: Secondary | ICD-10-CM

## 2023-02-19 DIAGNOSIS — Z7901 Long term (current) use of anticoagulants: Secondary | ICD-10-CM | POA: Insufficient documentation

## 2023-02-19 DIAGNOSIS — K449 Diaphragmatic hernia without obstruction or gangrene: Secondary | ICD-10-CM | POA: Diagnosis not present

## 2023-02-19 DIAGNOSIS — K589 Irritable bowel syndrome without diarrhea: Secondary | ICD-10-CM | POA: Insufficient documentation

## 2023-02-19 DIAGNOSIS — I110 Hypertensive heart disease with heart failure: Secondary | ICD-10-CM | POA: Diagnosis not present

## 2023-02-19 DIAGNOSIS — I5032 Chronic diastolic (congestive) heart failure: Secondary | ICD-10-CM | POA: Diagnosis not present

## 2023-02-19 DIAGNOSIS — Z809 Family history of malignant neoplasm, unspecified: Secondary | ICD-10-CM | POA: Diagnosis not present

## 2023-02-19 DIAGNOSIS — R002 Palpitations: Secondary | ICD-10-CM | POA: Insufficient documentation

## 2023-02-19 DIAGNOSIS — Z9861 Coronary angioplasty status: Secondary | ICD-10-CM | POA: Insufficient documentation

## 2023-02-19 DIAGNOSIS — K219 Gastro-esophageal reflux disease without esophagitis: Secondary | ICD-10-CM | POA: Insufficient documentation

## 2023-02-19 DIAGNOSIS — C4922 Malignant neoplasm of connective and soft tissue of left lower limb, including hip: Secondary | ICD-10-CM | POA: Insufficient documentation

## 2023-02-19 DIAGNOSIS — I10 Essential (primary) hypertension: Secondary | ICD-10-CM

## 2023-02-19 DIAGNOSIS — Z950 Presence of cardiac pacemaker: Secondary | ICD-10-CM | POA: Insufficient documentation

## 2023-02-19 DIAGNOSIS — E119 Type 2 diabetes mellitus without complications: Secondary | ICD-10-CM | POA: Insufficient documentation

## 2023-02-19 DIAGNOSIS — E785 Hyperlipidemia, unspecified: Secondary | ICD-10-CM | POA: Diagnosis not present

## 2023-02-19 DIAGNOSIS — I771 Stricture of artery: Secondary | ICD-10-CM | POA: Insufficient documentation

## 2023-02-19 DIAGNOSIS — I480 Paroxysmal atrial fibrillation: Secondary | ICD-10-CM | POA: Insufficient documentation

## 2023-02-19 DIAGNOSIS — I4719 Other supraventricular tachycardia: Secondary | ICD-10-CM

## 2023-02-19 DIAGNOSIS — M7989 Other specified soft tissue disorders: Secondary | ICD-10-CM | POA: Insufficient documentation

## 2023-02-19 DIAGNOSIS — Z8719 Personal history of other diseases of the digestive system: Secondary | ICD-10-CM | POA: Insufficient documentation

## 2023-02-19 DIAGNOSIS — Z87442 Personal history of urinary calculi: Secondary | ICD-10-CM | POA: Insufficient documentation

## 2023-02-19 DIAGNOSIS — I5030 Unspecified diastolic (congestive) heart failure: Secondary | ICD-10-CM | POA: Insufficient documentation

## 2023-02-19 DIAGNOSIS — R55 Syncope and collapse: Secondary | ICD-10-CM | POA: Insufficient documentation

## 2023-02-19 DIAGNOSIS — Z79899 Other long term (current) drug therapy: Secondary | ICD-10-CM | POA: Insufficient documentation

## 2023-02-19 DIAGNOSIS — C499 Malignant neoplasm of connective and soft tissue, unspecified: Secondary | ICD-10-CM

## 2023-02-19 DIAGNOSIS — Z96652 Presence of left artificial knee joint: Secondary | ICD-10-CM | POA: Diagnosis not present

## 2023-02-19 DIAGNOSIS — I252 Old myocardial infarction: Secondary | ICD-10-CM | POA: Diagnosis not present

## 2023-02-19 DIAGNOSIS — I4891 Unspecified atrial fibrillation: Secondary | ICD-10-CM | POA: Diagnosis not present

## 2023-02-19 DIAGNOSIS — Z9049 Acquired absence of other specified parts of digestive tract: Secondary | ICD-10-CM | POA: Diagnosis not present

## 2023-02-19 DIAGNOSIS — Z86018 Personal history of other benign neoplasm: Secondary | ICD-10-CM | POA: Insufficient documentation

## 2023-02-19 MED ORDER — ISOSORBIDE MONONITRATE ER 30 MG PO TB24: 30.0000 mg | ORAL_TABLET | Freq: Every day | ORAL | 3 refills | Status: DC

## 2023-02-19 MED ORDER — NITROGLYCERIN 0.4 MG SL SUBL: SUBLINGUAL_TABLET | SUBLINGUAL | 6 refills | Status: DC

## 2023-02-19 NOTE — Progress Notes (Signed)
Primary Care Provider: Shelva Majestic, MD [facility] Cardiologist: Bryan Lemma, MD Electrophysiologist: Thurmon Fair, MD  Orthopedic Surgeon-[facility] Dr. Andrey Campanile Oncologist: Dr. Myna Hidalgo  Clinic Note: Chief Complaint  Patient presents with   Follow-up    6 months.  Overall doing relatively well.  Intermittent episodes of sharp substernal chest discomfort not exertional.  Stable swelling.   Coronary Artery Disease    Still has a resting chest comfort but not exertional.   Atrial Fibrillation    Nothing to sense that she has had any breakthrough spells.  Remains on Eliquis and carvedilol PRN amiodarone.    ===================================  ASSESSMENT/PLAN   Problem List Items Addressed This Visit       Cardiology Problems   PAT (paroxysmal atrial tachycardia) (Chronic)    Pretty well-controlled with monitor and pacemaker.  Short little bursts.  Prolonged bursts she has  PRN amiodarone.      Relevant Medications   isosorbide mononitrate (IMDUR) 30 MG 24 hr tablet   nitroGLYCERIN (NITROSTAT) 0.4 MG SL tablet   PAF (paroxysmal atrial fibrillation) (HCC) (Chronic)    Status post pacemaker implant.  Fleeting bouts of PAT but no recurrent A-fib.  Continue carvedilol at current dose along with as needed use of amiodarone for breakthrough spells.  Overall seems to be pretty well rate/rhythm controlled.  No changes at this point.  On Eliquis with no bleeding issues.      Relevant Medications   isosorbide mononitrate (IMDUR) 30 MG 24 hr tablet   nitroGLYCERIN (NITROSTAT) 0.4 MG SL tablet   Essential hypertension (Chronic)    BP seems stable on carvedilol 6.25 mg twice daily, losartan 50 mg daily, spironolactone 25 mg every other day continue to monitor.      Relevant Medications   isosorbide mononitrate (IMDUR) 30 MG 24 hr tablet   nitroGLYCERIN (NITROSTAT) 0.4 MG SL tablet   Congestive heart failure with LV diastolic dysfunction, NYHA class 2  (HCC) (Chronic)    NYHA class IIa symptoms.  Most related diastolic dysfunction.  I think most of her edema is probably more related to venous stasis and not CHF as there is no real PND or orthopnea.  Plan: Continue combination of losartan, Imdur, carvedilol as well as 50 mg losartan along with intermittent spironolactone.  Continue using 80 mg twice daily Lasix but sliding scale based on daily weight.  Also uses intermittent close all.      Relevant Medications   isosorbide mononitrate (IMDUR) 30 MG 24 hr tablet   nitroGLYCERIN (NITROSTAT) 0.4 MG SL tablet   Celiac artery stenosis: 60% by Duplex - 90-95% by cath - Med Rx (Chronic)    Followed by Dr. Leone Payor, Dr. Gery Pray and Dr. Myra Gianotti. Major risk factor is her weight and presence of amiodarone.  Per GI      Relevant Medications   isosorbide mononitrate (IMDUR) 30 MG 24 hr tablet   nitroGLYCERIN (NITROSTAT) 0.4 MG SL tablet   CAD S/P percutaneous coronary angioplasty: pRCA BMS, mLAD BMS overlapped prox with DES for ISR - Primary (Chronic)    Last intervention was in 2012.  Last cath in 2016 showing widely patent mid to distal LAD stents and RCA stent.  Moderate disease in OM and small diagonal branches.  [facility]). Most recent Myoview in 2021 was nonischemic  BP has been well-controlled.  Refuses to take lipid medicines.  Therefore we are choosing to ignore. She has atypical musculoskeletal type chest discomfort but not anginal in nature.  With what ever  activity she does, she is seems to be tolerating monthly well.  Plan: Continue carvedilol 6.25 mg twice daily, losartan 50 mg daily, spironolactone 25 mg Monday Wednesday Friday. Continue Imdur 30 mg, but increase to 2 tablets daily for 2 days after the use of sublingual nitroglycerin. Not on aspirin or antiplatelet agent due to Eliquis for A-fib. Understands the risks of treatments. Refill NTG sublingual      Relevant Medications   isosorbide mononitrate (IMDUR) 30 MG 24 hr tablet    nitroGLYCERIN (NITROSTAT) 0.4 MG SL tablet     Other   Syncope and collapse    No further episodes.      Stasis edema of bilateral lower extremity (Chronic)    He has pretty significant lower extremity swelling.  Need to use more stockings and clearance.  Recommend foot elevation.  Will try to place ankle Unaboot's tunnel and rest..      Palpitations    Probably due to Something New on Monitor.  Short Bursts of A-Fib.      Costochondritis (Chronic)    Chronic costochondritis pain.  Her chest pain that she has had since her PCI has been costochondritis related.  We do stress, start self cooking.       ===================================  HPI:    [name] is a 82 y.o. female with a PMH below who presents today for 13-month follow-up. CV History  CAD - 07/2010 - Progressive Angina: BMS PCI RCA & LAD (pre-op Back Sgx)  03/2011 Unstable Angina => LAD proximal ISR -> Overlapping DES w/ PTCA of jailed D2 02/2012: Patent Stents -> 08/2012 (peri-Op MI) patent stents Most recent cath was July 2016: Widely patent LAD stents with D2 jailed.  Minimal D2 stenosis.  Proximal to mid RCA BMS with less than 10% ISR.  OM1 and D1 60% stenosis.-Stable.  Myoview May 2021: No ischemia or infarct.  LOW RISK => has chronic intermittent severe chest pain which she calls angina Chronic HFpEF Chronic bilateral LE edema Celiac Artery Stenosis - s/p Stent (2018) Bilateral Renal Atery Stenosis -> s/p L RA Stent (2011) Paroxysmal atrial tachycardia -> last anywhere from 30 seconds to 10 minutes. Symptomatic bradycardia with sinus pauses, Mobitz 2 AVB => s/p dual-chamber PPM Paroxysmal atrial fibrillation -> has short bursts lasting just a few hours. HLD-statin myopathy.  Unwilling to try any cholesterol medication  [name] was last seen on Sep 18 2022 -> she is doing very well.  Having completed the Thanksgiving Christmas rush of baking.  On her feet for long periods of time but did much better  with less chest pains.  Little bursts of fast heart rates but nothing prolonged.  Nothing more than a few minutes.  Maybe 3-4 times in the last 6 months had to use amiodarone 400 mg x 1.  Taking furosemide 80 mg twice daily with Lozol and spironolactone-edema pretty well-controlled.  Had noted weight loss of about 7 pounds.  7 back pain normalities.  Dizziness.Marland Kitchen  She was recently seen by Thurmon Fair, MD, on 10/2022 -> doing well.  No more dizziness.  No syncope.  No chest pain or pressure with rest or activity.  No exertional dyspnea with routine activity.  Systolic pressure slightly elevated with diastolic pressures somewhat low (pulse pressure) likely related to age related arterial compliance issues.  PPM interrogation showed 6% V pacing.  Very little A-fib.  Occasional sustained PAT  Recent Hospitalizations:  12/30/2022: Excision of soft tissue tumor-left popliteal fossa(9 cm). => Diagnosed  with primary myxofibrosarcoma of left posterior knee.-Complicated by wound drainage and incisional pain.  Treated with antibiotics.  Seen by Dr. Durene Cal 01/23/23 => no changes.  Still high  Seen by Dr. 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  => This was excision with positive margins.  Measured 7.5 x 5.5 x 5 cm.  => Indicated would prefer clear margins, but felt like they need to get pick possible wound infection straightened out.  Chest CT ordered.   Reviewed  CV studies:    The following studies were reviewed today: (if available, images/films reviewed: From Epic Chart or Care Everywhere) N/A:  Interval History:   [name] returns here today for routine follow-up but was doing well.  She still has her open Chest pain episodes that happen several times a day.  Not associated with activity.  They happen when she is doing her baking and kneading bread.  Does not occur when she is going shopping either at [employer] or [employer].  As long as she  walks with a cart she is able to do fine with no major issues.  She has been much always on the go when not stopping. Still has short little bursts of tachycardia but nothing prolonged.  Has not used any amiodarone. Edema seems to be pretty well stable-Still taking twice daily Lasix, but not routinely monitoring daily weights.  No PND orthopnea.  No syncope or near syncope no TIA or emesis vigorous.  No claudication.  No abdominal upset with eating. No melena, hematochezia or hematuria.  Apparently had microscopic hematuria but none visible.  REVIEWED OF SYSTEMS   Review of Systems  Constitutional:  Negative for malaise/fatigue and weight loss.  HENT:  Negative for congestion and nosebleeds.   Respiratory:  Negative for shortness of breath.   Cardiovascular:  Positive for chest pain and leg swelling (Stable).  Gastrointestinal:  Negative for abdominal pain, blood in stool and melena.  Genitourinary:  Negative for hematuria.  Musculoskeletal:  Positive for back pain and joint pain (Left knee-postop). Negative for falls.  Neurological:  Positive for dizziness (Unsteady gait). Negative for weakness.  Endo/Heme/Allergies:  Bruises/bleeds easily.  Psychiatric/Behavioral:  Negative for depression and memory loss (Although memory is getting worse.). The patient has insomnia. The patient is not nervous/anxious.     I have reviewed and (if needed) personally updated the patient's problem list, medications, allergies, past medical and surgical history, social and family history.   PAST MEDICAL HISTORY   Past Medical History:  Diagnosis Date   Adrenal adenoma    Arthritis    "fingers, right shoulder" (09/09/2017)   Atrial fibrillation (HCC) 12/2021   New diagnosis noted on pacemaker interrogation.   Back pain with radiation    Chronic Back Pain - mutliple surgeries (including tumor removal)   Bilateral edema of lower extremity    Chronic, likely related to venous stasis   Bradycardia     Pacemaker placed   CAD S/P percutaneous coronary angioplasty    a) LHC: 07/30/10. -- 3.0x6mm Integrity BMS to pRCA & 2.5x 15mm Integrity BMS mLAD(@ D2).  b) Class III-IV Angina 03/2011: LHC- ISR in LAD BMS -- prox overlapping Promus DES 2.5x69mm and PTCA of jailed D2 ostium-prox 80%. c) 02/03/12:  LHC- patent stents.  Jailed diagonal. with stable flow; d) Peri-OP NSTEMI 04/2012 - LHC in 12/'13 -    Celiac artery stenosis (HCC)    12/2016 - STENT placement  Complication of anesthesia    "used to wake up wild years ago" (09/09/2017)   Congestive heart failure with LV diastolic dysfunction, NYHA class 2 (HCC)    06/13/10:  last 2D echo-  EF >55%, Mild TR, Mod Conc LVH - Grade 1 diastolic dysfunction (abnormal relaxation) --> LVEDP on Cath 28 mmHg & mild 2nd Pulm HTN   Diet-controlled type 2 diabetes mellitus (HCC)    Diverticulitis of colon (without mention of hemorrhage)(562.11)    Diverticulosis    Dyslipidemia, goal LDL below 70    Intolerant to statins   GERD (gastroesophageal reflux disease)    Hepatitis ~ 1957   "yellow jaundice" (01/14/2017)   Hepatomegaly    Hiatal hernia    History of blood transfusion 04/2012   "when I had a heart attack"   History of kidney stones    "I've got a stone embedded in one of my kidneys" (09/09/2017)   History of stomach ulcers "years ago"   Irritable bowel syndrome (IBS)    Labile essential hypertension    Partially related to RAS   Mesenteric artery stenosis (HCC)    95% Celiac Artery - ostial, 20-30% SMA.  Bilateral Renal A: L RA stent patent, R RA 20-30% -- Conservative Management   Mobitz type 2 second degree AV block    Status post PPM placement   NSTEMI (non-ST elevated myocardial infarction) (HCC) 04/2012   Unclear the details, apparently this was postoperative from her back surgery that she was cleared for my last saw her in June.  Reportedly had stents placed   Pancreas divisum    On pancreatic enzyme   PAT (paroxysmal atrial tachycardia)     Renal artery stenosis (HCC) 2011; 12/'13   a) Angiogram 02/03/12:  50-60%L RA stenosis, 40% R R Inferior artery; b) 12/'13: S/P L RA Stent ([facility]) 6.0 mm x 15 mm; c) Renal Duplex 10/2013: <60% L RA, <60 R RA, ~60% SMA & Celiac A.   S/P placement of cardiac pacemaker 07/2021   Medtronic   Stricture and stenosis of esophagus    Thyroid nodule    Unspecified gastritis and gastroduodenitis without mention of hemorrhage     PAST SURGICAL HISTORY -> History reviewed in epic    Past CV Procedures History:  Procedure Laterality Date   CARDIAC CATHETERIZATION N/A 03/16/2015   Procedure: Left Heart Cath and Coronary Angiography;  Surgeon: Marykay Lex, MD;  Location: [facility] INVASIVE CV LAB;  Service: Cardiovascular; Widely patent m-dLAD stents as wellas pRCA stent.  High LVEDP, small Diag & Om vessels with moderate stenosis    Cardiac Event Monitor  01/2017   Mostly NSR with occasional sinus tachycardia and rare bradycardia. No A. fib. No PND or PSVT. Rare PACs and PVCs.   CORONARY ANGIOPLASTY WITH STENT PLACEMENT  07/30/2010   3.0x41mm Integrity BMS to RCA and 2.5x 15mm Integrity BMS LAD.  = BMS 2/2 Pre-OP Back Sgx.    CORONARY ANGIOPLASTY WITH STENT PLACEMENT  03/18/2011   Cutting Balloon PTCA of D2 (jailed - 80% ostial stenosis), DES PCI of mid LAD ISR - > Promus DES 2.5 x 16 mm postdilated to 2.8 mm) covering the proximal portion of the previous stent   LEFT HEART CATH AND CORONARY ANGIOGRAPHY  07/30/2010   severe LAD-diagonal 80%, moderate to severe proximal RCA.  Mean PAP 15 mmHg.  PCWP mean 17 mmHg.  RVP 45/11 mmHg.  PAP 47/24 mmHg, mean 31 mmHg.  LVEDP 27 mmHg   LEFT HEART  CATHETERIZATION WITH CORONARY ANGIOGRAM N/A 02/03/2012   Procedure: LEFT HEART CATHETERIZATION WITH CORONARY ANGIOGRAM;  Surgeon: Marykay Lex, MD;  Location: [facility] CATH LAB; widely patent LAD and RCA stents.  Patent D2 ostium.  Moderate L Renal A stenosis (56%), R Renal A 40-50% t.  LVEDP 20 mmHg.   LEFT HEART  CATHETERIZATION WITH CORONARY ANGIOGRAM N/A 05/12/2014   Procedure: LEFT HEART CATHETERIZATION WITH CORONARY ANGIOGRAM;  Surgeon: Runell Gess, MD;  Location: [facility] CATH LAB;  Service: Cardiovascular: Stable CAD. Patent stents. Patent renal artery stent   LEFT HEART CATHETERIZATION WITH CORONARY ANGIOGRAM  08/2012   Peri-Op MI @ [facility] -- 40% ostial D1, patent LAD stents, 10% RCA ISR   LEFT HEART CATHETERIZATION WITH CORONARY ANGIOGRAM   03/18/2011   70% ISR of LAD stent just after D2 (D2 has ostial 80 to 90% stenosis.  20 to 30% ISR RCA.  EDP elevated at 28 mmHg   NM MYOVIEW LTD  01/05/2020    LOW RISK. EF 60-65%.  No EKG changes.  No infarct, no ischemia.   PACEMAKER IMPLANT N/A 07/09/2021   Procedure: PACEMAKER IMPLANT;  Surgeon: Thurmon Fair, MD;  Location: [facility] INVASIVE CV LAB;  Service: Cardiovascular;  Laterality: N/A;   PERIPHERAL VASCULAR BALLOON ANGIOPLASTY  09/09/2017   Procedure: PERIPHERAL VASCULAR BALLOON ANGIOPLASTY;  Surgeon: Nada Libman, MD;  Location: [facility] INVASIVE CV LAB;  Service: Cardiovascular;;  Celiac instent   PERIPHERAL VASCULAR INTERVENTION  01/14/2017   Procedure: Peripheral Vascular Intervention;  Surgeon: Nada Libman, MD;  Location: [facility] INVASIVE CV LAB;  Service: Cardiovascular;;  mesentric   RENAL ARTERY STENT Left 08/2012   @ [facility] - 6.0 mm x 15 mm   TRANSTHORACIC ECHOCARDIOGRAM  07/10/2021   (Postop PPM): Normal LV size and function.  EF 60 to 65%.  No RWMA.  GR 1 DD.  Mild LA dilation.  Normal RV size and function.  Normal RAP.  Normal aortic and mitral valves.  No pericardial effusion.   VISCERAL ANGIOGRAM N/A 05/12/2014   Procedure: VISCERAL ANGIOGRAM;  Surgeon: Runell Gess, MD;  Location: [facility] CATH LAB;  25% ostial proximal celiac artery with downward takeoff.  23% proximal SMA and 56% proximal IMA.  Left renal artery stent widely patent.  Right renal artery is 20 to 30% proximal stenosis.   VISCERAL ANGIOGRAPHY N/A  01/14/2017   Procedure: Mesenteric  Angiography;  Surgeon: Nada Libman, MD;  Location: [facility] INVASIVE CV LAB;  Service: Cardiovascular;  Laterality: N/A;   VISCERAL ANGIOGRAPHY N/A 09/09/2017   Procedure: VISCERAL ANGIOGRAPHY;  Surgeon: Nada Libman, MD;  Location: [facility] INVASIVE CV LAB;  Service: Cardiovascular;  Laterality: N/A;    Immunization History  Administered Date(s) Administered   Tdap 03/19/2012   Zoster Recombinat (Shingrix) 06/03/2022, 09/05/2022   Zoster, Live 03/31/2009    MEDICATIONS/ALLERGIES   Current Meds  Medication Sig   alendronate (FOSAMAX) 70 MG tablet Take 1 tablet (70 mg total) by mouth every 7 (seven) days. Take with a full glass of water on an empty stomach.   allopurinol (ZYLOPRIM) 100 MG tablet Take 2 tablets (200 mg total) by mouth daily.   amiodarone (PACERONE) 200 MG tablet TAKE 400 MG (2 TABLETS) ONE DOSE AS NEED FOR A FAST HEART RATE ,IF AFTER 12 HOURS STILL PRESENT TAKE 200 MG ( 1 TABLET) ONE DOSE FOR EACH EPISODE   apixaban (ELIQUIS) 5 MG TABS tablet Take 1 tablet (5 mg total) by mouth 2 (  two) times daily.   Artificial Tear Solution (TEARS RENEWED OP) Apply 1 drop to eye daily as needed (dry eyes).   carvedilol (COREG) 6.25 MG tablet TAKE 1 TABLET BY MOUTH TWICE DAILY WITH A MEAL. MAY TAKE AN EXTRA TABLET DAILY IF HAVING PALPATIONS (Patient taking differently: Take 6.25 mg by mouth 2 (two) times daily with a meal.)   colchicine 0.6 MG tablet Take by mouth.   cyanocobalamin (VITAMIN B12) 1000 MCG/ML injection 1000 MCG INJECTION ONCE PER MONTH. PLEASE PROVIDE SYRINGES AS WELL   diclofenac sodium (VOLTAREN) 1 % GEL Apply 2 g topically 4 (four) times daily as needed (pain).   furosemide (LASIX) 80 MG tablet Take 80 mg by mouth 2 (two) times daily.   HYDROcodone-acetaminophen (NORCO/VICODIN) 5-325 MG tablet Take 1 tablet by mouth every 6 (six) hours as needed for moderate pain or severe pain (do not take tylenol with this. do not drive for 8 hours after  taking.).   hyoscyamine (ANASPAZ) 0.125 MG TBDP disintergrating tablet Place 1 tablet (0.125 mg total) under the tongue every 6 (six) hours as needed (abdominal cramping).   indapamide (LOZOL) 2.5 MG tablet TAKE ONE TABLET BY MOUTH DAILY, 30 MINUTES BEFORE DAILY FUROSEMIDE.   lidocaine (XYLOCAINE) 5 % ointment Apply 1 application. topically as needed.   losartan (COZAAR) 50 MG tablet TAKE 1 TABLET BY MOUTH EVERY DAY   meclizine (ANTIVERT) 12.5 MG tablet TAKE 1 TABLET BY MOUTH THREE TIMES A DAY AS NEEDED FOR DIZZINESS   spironolactone (ALDACTONE) 25 MG tablet TAKE 1 TABLET BY MOUTH AS DIRECTED. TAKE 1 TABLET ON MONDAYS, WEDNESDAYS, AND FRIDAYS AT 6 PM.   []  isosorbide mononitrate (IMDUR) 30 MG 24 hr tablet TAKE 1 TABLET BY MOUTH EVERY DAY   []  nitroGLYCERIN (NITROSTAT) 0.4 MG SL tablet DISSOLVE 1 TABLET UNDER THE TONGUE EVERY 5 MINUTES AS NEEDED FOR CHEST PAIN    Allergies  Allergen Reactions   Codeine Phosphate Anaphylaxis, Shortness Of Breath and Swelling   Contrast Media [Iodinated Contrast Media] Swelling    Swelling of the face. Reaction was to ionic contrast many years ago. Patient has had non-ionic contrast many time without pre medication and has had no reaction.    Statins Other (See Comments)    Hyperactivity   Tramadol Palpitations and Other (See Comments)    Hyperactivity    SOCIAL HISTORY/FAMILY HISTORY   Reviewed in Epic:  Pertinent findings: Never smoked.  No alcohol.   Social History   Social History Narrative   Married 63 years in 2023. 1 child Conne in [place].  1 grandchild in [place].       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       Hobbies: cooking/baking, loves to feed people    OBJCTIVE -PE, EKG, labs   Wt Readings from Last 3 Encounters:  02/19/23 198 lb 6.4 oz (90 kg)  02/19/23 196 lb 12.8 oz  (89.3 kg)  02/05/23 198 lb (89.8 kg)    Physical Exam: BP (!) 124/56 (BP Location: Left Arm, Patient Position: Sitting, Cuff Size: Large)   Pulse 84   Ht 5\' 8"  (1.727 m)   Wt 196 lb 12.8 oz (89.3 kg)   SpO2 94%   BMI 29.92 kg/m  Physical Exam Vitals reviewed.  Constitutional:      General: She is not in acute distress.  Appearance: Normal appearance. She is not ill-appearing or toxic-appearing.     Comments: Borderline Obese, otherwise well-nourished well-groomed.  HENT:     Head: Normocephalic and atraumatic.  Neck:     Vascular: No carotid bruit.  Cardiovascular:     Rate and Rhythm: Normal rate and regular rhythm.     Pulses: Normal pulses.     Heart sounds: Normal heart sounds. No murmur heard.    No friction rub. No gallop.  Pulmonary:     Effort: Pulmonary effort is normal. No respiratory distress.     Breath sounds: Normal breath sounds. No wheezing or rales.  Chest:     Chest wall: Tenderness (Costosternal tenderness along the through fifth ribs.  Worse on the left.) present.  Musculoskeletal:        General: Swelling (Puffy swelling lips 4 seconds in place.) present. Normal range of motion.     Cervical back: Normal range of motion and neck supple.  Skin:    General: Skin is warm and dry.     Coloration: Skin is not jaundiced or pale.     Findings: No bruising.  Neurological:     General: No focal deficit present.     Mental Status: She is alert and oriented to person, place, and time.     Gait: Gait abnormal (Unsteady gait).  Psychiatric:        Mood and Affect: Mood normal.        Behavior: Behavior normal.        Thought Content: Thought content normal.        Judgment: Judgment normal.     Comments: Still somewhat stubborn about treating lipids.     Adult ECG Report Not checked  Recent Labs: Reviewed   Lab Results  Component Value Date   CHOL 229 (H) 01/10/2022   HDL 36.90 (L) 01/10/2022   LDLCALC 152 (H) 09/08/2019   LDLDIRECT 147.0  01/10/2022   TRIG 327.0 (H) 01/10/2022   CHOLHDL 6 01/10/2022   Lab Results  Component Value Date   CREATININE 0.96 02/05/2023   BUN 22 02/05/2023   NA 136 02/05/2023   K 4.3 02/05/2023   CL 100 02/05/2023   CO2 27 02/05/2023      Latest Ref Rng & Units 02/05/2023   10:53 AM 10/17/2022    4:28 PM 07/22/2022   11:25 AM  CBC  WBC 4.0 - 10.5 K/uL 7.9  9.4  7.0   Hemoglobin 12.0 - 15.0 g/dL 29.9  37.1  69.6   Hematocrit 36.0 - 46.0 % 34.1  34.8  37.0   Platelets 150 - 400 K/uL 250  292.0  268.0     Lab Results  Component Value Date   HGBA1C 6.4 12/27/2022   Lab Results  Component Value Date   TSH 3.050 04/29/2022    ================================================== I spent a total of 28 minutes with the patient spent in direct patient consultation.  Additional time spent with chart review  / charting (studies, outside notes, etc): 16 min Total Time: 44 min  Current medicines are reviewed at length with the patient today.  (+/- concerns) none  Notice: This dictation was prepared with Dragon dictation along with smart phrase technology. Any transcriptional errors that result from this process are unintentional and may not be corrected upon review.  Studies Ordered:   No orders of the defined types were placed in this encounter.  Meds ordered this encounter  Medications   isosorbide mononitrate (IMDUR) 30 MG 24  hr tablet    Sig: Take 1 tablet (30 mg total) by mouth daily. May take an extra dose if you use Nitroglycerin  sublingual tablet for 2 days afterwards    Dispense:  120 tablet    Refill:  3    Direction changed , discontinue previous dose   nitroGLYCERIN (NITROSTAT) 0.4 MG SL tablet    Sig: DISSOLVE 1 TABLET UNDER THE TONGUE EVERY 5 MINUTES AS NEEDED FOR CHEST PAIN    Dispense:  25 tablet    Refill:  6    Patient Instructions / Medication Changes & Studies & Tests Ordered   Patient Instructions  Medication Instructions:     Continue  taking  medications  If you use Nitroglycerin tabs- take an extra dose IMDUR  that day , then take an extra dose the following dose.  *If you need a refill on your cardiac medications before your next appointment, please call your pharmacy*   Lab Work:  Not needed   Testing/Procedures: Not needed   Follow-Up: At [facility], you and your health needs are our priority.  As part of our continuing mission to provide you with exceptional heart care, we have created designated Provider Care Teams.  These Care Teams include your primary Cardiologist (physician) and Advanced Practice Providers (APPs -  Physician Assistants and Nurse Practitioners) who all work together to provide you with the care you need, when you need it.     Your next appointment:   4 month(s)  The format for your next appointment:   In Person  Provider:   Bryan Lemma, MD        Marykay Lex, MD, MS Bryan Lemma, M.D., M.S. Interventional Cardiologist  [facility]  Pager # [phone] Phone # [phone] [address] [place], [place] [address]   Thank you for choosing [facility]!!

## 2023-02-19 NOTE — Patient Instructions (Signed)
Medication Instructions:     Continue  taking medications  If you use Nitroglycerin tabs- take an extra dose IMDUR  that day , then take an extra dose the following dose.  *If you need a refill on your cardiac medications before your next appointment, please call your pharmacy*   Lab Work:  Not needed   Testing/Procedures: Not needed   Follow-Up: At [facility], you and your health needs are our priority.  As part of our continuing mission to provide you with exceptional heart care, we have created designated Provider Care Teams.  These Care Teams include your primary Cardiologist (physician) and Advanced Practice Providers (APPs -  Physician Assistants and Nurse Practitioners) who all work together to provide you with the care you need, when you need it.     Your next appointment:   4 month(s)  The format for your next appointment:   In Person  Provider:   Bryan Lemma, MD

## 2023-02-19 NOTE — Progress Notes (Signed)
TO BE COMPLETED BY RADIATION ONCOLOGIST OFFICE:   Patient Name: [name]   Date of Birth: 04-23-1941   Radiation Oncologist: Dr. Antony Blackbird   Site to be Treated: Left Knee   Will x-rays >10 MV be used? No   Will the radiation be >10 cm from the device? Yes   Planned Treatment Start Date: TBD   TO BE COMPLETED BY CARDIOLOGIST OFFICE:   Device Information:  Pacemaker [x]      ICD []    Brand: Medtronic: [id number] Model #: [address] DR MRI  Serial Number: [id number]     Date of Placement: 07/09/2021  Site of Placement: left upper chest  Remote Device Check--Frequency: q 91 days   Last Check: 01/09/2023  Is the Patient Pacer Dependent?:  Yes []   No [x]   Does cardiologist request Radiation Oncology to schedule device testing by vendor for the following:  Prior to the Initiation of Treatments?  Yes []  No [x]  During Treatments?  Yes []  No [x]  Post Radiation Treatments?  Yes []  No [x]   Is device monitoring necessary by vendor/cardiologist team during treatments?  Yes []   No [x]   Is cardiac monitoring by Radiation Oncology nursing necessary during treatments? Yes []   No [x]   Do you recommend device be relocated prior to Radiation Treatment? Yes []   No [x]   **PLEASE LIST ANY NOTES OR SPECIAL REQUESTS:       CARDIOLOGIST SIGNATURE:  Dr. Thurmon Fair Per Device Clinic Standing Orders, Wiliam Ke  02/19/2023 2:55 PM  **Please route completed form back to Radiation Oncology Nursing and "P [facility] RAD ONC ADMIN", OR send an update if there will be a delay in having form completed by expected start date.  **Call [phone] if you have any questions or do not get an in-basket response from a Radiation Oncology staff member

## 2023-02-20 ENCOUNTER — Telehealth: Payer: Self-pay

## 2023-02-20 ENCOUNTER — Encounter: Payer: Self-pay | Admitting: *Deleted

## 2023-02-20 NOTE — Telephone Encounter (Signed)
-----   Message from Josph Macho, MD sent at 02/19/2023  5:38 PM EDT ----- Please call let him know that the CT scan looks okay.  There is no obvious disease in her lung.

## 2023-02-20 NOTE — Telephone Encounter (Signed)
Advised via MyChart.

## 2023-02-20 NOTE — Progress Notes (Signed)
Reviewed CT which showed no concern for mets.   Patient was seen by RadOnc yesterday and she will proceed with radiation therapy. She is scheduled for simulation on 02/26/2023.  Oncology Nurse Navigator Documentation     02/20/2023    8:00 AM  Oncology Nurse Navigator Flowsheets  Planned Course of Treatment Radiation  Phase of Treatment Radiation  Radiation Actual Start Date: 03/05/2023  Radiation Expected End Date: 04/28/2023  Navigator Follow Up Date: 04/09/2023  Navigator Follow Up Reason: Follow-up Appointment  Navigator Location [facility]  Navigator Encounter Type Scan Review;Appt/Treatment Plan Review  Patient Visit Type MedOnc  Treatment Phase Pre-Tx/Tx Discussion  Barriers/Navigation Needs Coordination of Care;Education  Interventions None Required  Acuity Level 2-Minimal Needs (1-2 Barriers Identified)  Support Groups/Services Friends and Family  Time Spent with Patient 15

## 2023-02-24 ENCOUNTER — Encounter: Payer: Self-pay | Admitting: Cardiology

## 2023-02-24 NOTE — Assessment & Plan Note (Signed)
Last intervention was in 2012.  Last cath in 2016 showing widely patent mid to distal LAD stents and RCA stent.  Moderate disease in OM and small diagonal branches.  [facility]). Most recent Myoview in 2021 was nonischemic  BP has been well-controlled.  Refuses to take lipid medicines.  Therefore we are choosing to ignore. She has atypical musculoskeletal type chest discomfort but not anginal in nature.  With what ever activity she does, she is seems to be tolerating monthly well.  Plan: Continue carvedilol 6.25 mg twice daily, losartan 50 mg daily, spironolactone 25 mg Monday Wednesday Friday. Continue Imdur 30 mg, but increase to 2 tablets daily for 2 days after the use of sublingual nitroglycerin. Not on aspirin or antiplatelet agent due to Eliquis for A-fib. Understands the risks of treatments. Refill NTG sublingual

## 2023-02-24 NOTE — Progress Notes (Incomplete)
Primary Care Provider: Shelva Majestic, MD [facility] Cardiologist: Bryan Lemma, MD Electrophysiologist: Thurmon Fair, MD  Orthopedic Surgeon-[facility] Dr. Andrey Campanile Oncologist: Dr. Myna Hidalgo  Clinic Note: No chief complaint on file.   ===================================  ASSESSMENT/PLAN   Problem List Items Addressed This Visit   None   ===================================  HPI:    [name] is a 82 y.o. female with a PMH below who presents today for 49-month follow-up. CV History  CAD - 07/2010 - Progressive Angina: BMS PCI RCA & LAD (pre-op Back Sgx)  03/2011 Unstable Angina => LAD proximal ISR -> Overlapping DES w/ PTCA of jailed D2 02/2012: Patent Stents -> 08/2012 (peri-Op MI) patent stents Most recent cath was July 2016: Widely patent LAD stents with D2 jailed.  Minimal D2 stenosis.  Proximal to mid RCA BMS with less than 10% ISR.  OM1 and D1 60% stenosis.-Stable.  Myoview May 2021: No ischemia or infarct.  LOW RISK => has chronic intermittent severe chest pain which she calls angina Chronic HFpEF Chronic bilateral LE edema Celiac Artery Stenosis - s/p Stent (2018) Bilateral Renal Atery Stenosis -> s/p L RA Stent (2011) Paroxysmal atrial tachycardia -> last anywhere from 30 seconds to 10 minutes. Symptomatic bradycardia with sinus pauses, Mobitz 2 AVB => s/p dual-chamber PPM Paroxysmal atrial fibrillation -> has short bursts lasting just a few hours. HLD-statin myopathy.  Unwilling to try any cholesterol medication  SHEILIA REZNICK was last seen on Sep 18 2022 -> she is doing very well.  Having completed the Thanksgiving Christmas rush of baking.  On her feet for long periods of time but did much better with less chest pains.  Little bursts of fast heart rates but nothing prolonged.  Nothing more than a few minutes.  Maybe 3-4 times in the last 6 months had to use amiodarone 400 mg x 1.  Taking furosemide 80 mg twice daily with Lozol and  spironolactone-edema pretty well-controlled.  Had noted weight loss of about 7 pounds.  7 back pain normalities.  Dizziness.Marland Kitchen  She was recently seen by Thurmon Fair, MD, on 10/2022 -> doing well.  No more dizziness.  No syncope.  No chest pain or pressure with rest or activity.  No exertional dyspnea with routine activity.  Systolic pressure slightly elevated with diastolic pressures somewhat low (pulse pressure) likely related to age related arterial compliance issues.  PPM interrogation showed 6% V pacing.  Very little A-fib.  Occasional sustained PAT  Recent Hospitalizations:  12/30/2022: Excision of soft tissue tumor-left popliteal fossa(9 cm). => Diagnosed with primary myxofibrosarcoma of left posterior knee.-Complicated by wound drainage and incisional pain.  Treated with antibiotics.  Seen by Dr. Durene Cal 01/23/23 => no changes.  Still high  Seen by Dr. 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  => This was excision with positive margins.  Measured 7.5 x 5.5 x 5 cm.  => Indicated would prefer clear margins, but felt like they need to get pick possible wound infection straightened out.  Chest CT ordered.   Reviewed  CV studies:    The following studies were reviewed today: (if available, images/films reviewed: From Epic Chart or Care Everywhere) N/A:  Interval History:   ROSI [name] returns here today for routine follow-up but was doing well.  She still has her open Chest pain episodes that happen several times a day.  Not associated with activity.  They happen when she is  doing her baking and kneading bread.  Does not occur when she is going shopping either at [employer] or [employer].  As long as she walks with a cart she is able to do fine with no major issues.  She has been much always on the go when not stopping. Still has short little bursts of tachycardia but nothing prolonged.  Has not used any amiodarone. Edema seems to be  pretty well stable-Still taking twice daily Lasix, but not routinely monitoring daily weights.  No PND orthopnea.  No syncope or near syncope no TIA or emesis vigorous.  No claudication.  No abdominal upset with eating. No melena, hematochezia or hematuria.  Apparently had microscopic hematuria but none visible.  REVIEWED OF SYSTEMS   Review of Systems  Constitutional:  Negative for malaise/fatigue and weight loss.  HENT:  Negative for congestion and nosebleeds.   Respiratory:  Negative for shortness of breath.   Cardiovascular:  Positive for chest pain and leg swelling (Stable).  Gastrointestinal:  Negative for abdominal pain, blood in stool and melena.  Genitourinary:  Negative for hematuria.  Musculoskeletal:  Positive for back pain and joint pain (Left knee-postop). Negative for falls.  Neurological:  Positive for dizziness (Unsteady gait). Negative for weakness.  Endo/Heme/Allergies:  Bruises/bleeds easily.  Psychiatric/Behavioral:  Negative for depression and memory loss (Although memory is getting worse.). The patient has insomnia. The patient is not nervous/anxious.     I have reviewed and (if needed) personally updated the patient's problem list, medications, allergies, past medical and surgical history, social and family history.   PAST MEDICAL HISTORY   Past Medical History:  Diagnosis Date  . Adrenal adenoma   . Arthritis    "fingers, right shoulder" (09/09/2017)  . Atrial fibrillation (HCC) 12/2021   New diagnosis noted on pacemaker interrogation.  . Back pain with radiation    Chronic Back Pain - mutliple surgeries (including tumor removal)  . Bilateral edema of lower extremity    Chronic, likely related to venous stasis  . Bradycardia    Pacemaker placed  . CAD S/P percutaneous coronary angioplasty    a) LHC: 07/30/10. -- 3.0x44mm Integrity BMS to pRCA & 2.5x 15mm Integrity BMS mLAD(@ D2).  b) Class III-IV Angina 03/2011: LHC- ISR in LAD BMS -- prox overlapping  Promus DES 2.5x56mm and PTCA of jailed D2 ostium-prox 80%. c) 02/03/12:  LHC- patent stents.  Jailed diagonal. with stable flow; d) Peri-OP NSTEMI 04/2012 - LHC in 12/'13 -   . Celiac artery stenosis (HCC)    12/2016 - STENT placement  . Complication of anesthesia    "used to wake up wild years ago" (09/09/2017)  . Congestive heart failure with LV diastolic dysfunction, NYHA class 2 (HCC)    06/13/10:  last 2D echo-  EF >55%, Mild TR, Mod Conc LVH - Grade 1 diastolic dysfunction (abnormal relaxation) --> LVEDP on Cath 28 mmHg & mild 2nd Pulm HTN  . Diet-controlled type 2 diabetes mellitus (HCC)   . Diverticulitis of colon (without mention of hemorrhage)(562.11)   . Diverticulosis   . Dyslipidemia, goal LDL below 70    Intolerant to statins  . GERD (gastroesophageal reflux disease)   . Hepatitis ~ 1957   "yellow jaundice" (01/14/2017)  . Hepatomegaly   . Hiatal hernia   . History of blood transfusion 04/2012   "when I had a heart attack"  . History of kidney stones    "I've got a stone embedded in one of my kidneys" (09/09/2017)  .  History of stomach ulcers "years ago"  . Irritable bowel syndrome (IBS)   . Labile essential hypertension    Partially related to RAS  . Mesenteric artery stenosis (HCC)    95% Celiac Artery - ostial, 20-30% SMA.  Bilateral Renal A: L RA stent patent, R RA 20-30% -- Conservative Management  . Mobitz type 2 second degree AV block    Status post PPM placement  . NSTEMI (non-ST elevated myocardial infarction) (HCC) 04/2012   Unclear the details, apparently this was postoperative from her back surgery that she was cleared for my last saw her in June.  Reportedly had stents placed  . Pancreas divisum    On pancreatic enzyme  . PAT (paroxysmal atrial tachycardia)   . Renal artery stenosis (HCC) 2011; 12/'13   a) Angiogram 02/03/12:  50-60%L RA stenosis, 40% R R Inferior artery; b) 12/'13: S/P L RA Stent ([facility]) 6.0 mm x 15 mm; c) Renal Duplex 10/2013: <60% L RA, <60  R RA, ~60% SMA & Celiac A.  . S/P placement of cardiac pacemaker 07/2021   [employer]  . Stricture and stenosis of esophagus   . Thyroid nodule   . Unspecified gastritis and gastroduodenitis without mention of hemorrhage     PAST SURGICAL HISTORY -> History reviewed in epic    Past CV Procedures History:  Procedure Laterality Date  . CARDIAC CATHETERIZATION N/A 03/16/2015   Procedure: Left Heart Cath and Coronary Angiography;  Surgeon: Marykay Lex, MD;  [facility] INVASIVE CV LAB;  Service: Cardiovascular; Widely patent m-dLAD stents as wellas pRCA stent.  High LVEDP, small Diag & Om vessels with moderate stenosis   . Cardiac Event Monitor  01/2017   Mostly NSR with occasional sinus tachycardia and rare bradycardia. No A. fib. No PND or PSVT. Rare PACs and PVCs.  . CORONARY ANGIOPLASTY WITH STENT PLACEMENT  07/30/2010   3.0x57mm Integrity BMS to RCA and 2.5x 15mm Integrity BMS LAD.  = BMS 2/2 Pre-OP Back Sgx.   . CORONARY ANGIOPLASTY WITH STENT PLACEMENT  03/18/2011   Cutting Balloon PTCA of D2 (jailed - 80% ostial stenosis), DES PCI of mid LAD ISR - > Promus DES 2.5 x 16 mm postdilated to 2.8 mm) covering the proximal portion of the previous stent  . LEFT HEART CATH AND CORONARY ANGIOGRAPHY  07/30/2010   severe LAD-diagonal 80%, moderate to severe proximal RCA.  Mean PAP 15 mmHg.  PCWP mean 17 mmHg.  RVP 45/11 mmHg.  PAP 47/24 mmHg, mean 31 mmHg.  LVEDP 27 mmHg  . LEFT HEART CATHETERIZATION WITH CORONARY ANGIOGRAM N/A 02/03/2012   Procedure: LEFT HEART CATHETERIZATION WITH CORONARY ANGIOGRAM;  Surgeon: Marykay Lex, MD;  Location: [facility] CATH LAB; widely patent LAD and RCA stents.  Patent D2 ostium.  Moderate L Renal A stenosis (56%), R Renal A 40-50% t.  LVEDP 20 mmHg.  Marland Kitchen LEFT HEART CATHETERIZATION WITH CORONARY ANGIOGRAM N/A 05/12/2014   Procedure: LEFT HEART CATHETERIZATION WITH CORONARY ANGIOGRAM;  Surgeon: Runell Gess, MD;  Location: [facility] CATH LAB;  Service: Cardiovascular:  Stable CAD. Patent stents. Patent renal artery stent  . LEFT HEART CATHETERIZATION WITH CORONARY ANGIOGRAM  08/2012   Peri-Op MI @ [facility] -- 40% ostial D1, patent LAD stents, 10% RCA ISR  . LEFT HEART CATHETERIZATION WITH CORONARY ANGIOGRAM   03/18/2011   70% ISR of LAD stent just after D2 (D2 has ostial 80 to 90% stenosis.  20 to 30% ISR RCA.  EDP elevated  at 28 mmHg  . NM MYOVIEW LTD  01/05/2020    LOW RISK. EF 60-65%.  No EKG changes.  No infarct, no ischemia.  Marland Kitchen PACEMAKER IMPLANT N/A 07/09/2021   Procedure: PACEMAKER IMPLANT;  Surgeon: Thurmon Fair, MD;  Location: [facility] INVASIVE CV LAB;  Service: Cardiovascular;  Laterality: N/A;  . PERIPHERAL VASCULAR BALLOON ANGIOPLASTY  09/09/2017   Procedure: PERIPHERAL VASCULAR BALLOON ANGIOPLASTY;  Surgeon: Nada Libman, MD;  Location: [facility] INVASIVE CV LAB;  Service: Cardiovascular;;  Celiac instent  . PERIPHERAL VASCULAR INTERVENTION  01/14/2017   Procedure: Peripheral Vascular Intervention;  Surgeon: Nada Libman, MD;  Location: [facility] INVASIVE CV LAB;  Service: Cardiovascular;;  mesentric  . RENAL ARTERY STENT Left 08/2012   @ [facility] - 6.0 mm x 15 mm  . TRANSTHORACIC ECHOCARDIOGRAM  07/10/2021   (Postop PPM): Normal LV size and function.  EF 60 to 65%.  No RWMA.  GR 1 DD.  Mild LA dilation.  Normal RV size and function.  Normal RAP.  Normal aortic and mitral valves.  No pericardial effusion.  Marland Kitchen VISCERAL ANGIOGRAM N/A 05/12/2014   Procedure: VISCERAL ANGIOGRAM;  Surgeon: Runell Gess, MD;  Location: [facility] CATH LAB;  25% ostial proximal celiac artery with downward takeoff.  23% proximal SMA and 56% proximal IMA.  Left renal artery stent widely patent.  Right renal artery is 20 to 30% proximal stenosis.  Marland Kitchen VISCERAL ANGIOGRAPHY N/A 01/14/2017   Procedure: Mesenteric  Angiography;  Surgeon: Nada Libman, MD;  Location: [facility] INVASIVE CV LAB;  Service: Cardiovascular;  Laterality: N/A;  . VISCERAL ANGIOGRAPHY N/A 09/09/2017    Procedure: VISCERAL ANGIOGRAPHY;  Surgeon: Nada Libman, MD;  Location: [facility] INVASIVE CV LAB;  Service: Cardiovascular;  Laterality: N/A;    Immunization History  Administered Date(s) Administered  . Tdap 03/19/2012  . Zoster Recombinat (Shingrix) 06/03/2022, 09/05/2022  . Zoster, Live 03/31/2009    MEDICATIONS/ALLERGIES   Current Meds  Medication Sig  . alendronate (FOSAMAX) 70 MG tablet Take 1 tablet (70 mg total) by mouth every 7 (seven) days. Take with a full glass of water on an empty stomach.  Marland Kitchen allopurinol (ZYLOPRIM) 100 MG tablet Take 2 tablets (200 mg total) by mouth daily.  Marland Kitchen amiodarone (PACERONE) 200 MG tablet TAKE 400 MG (2 TABLETS) ONE DOSE AS NEED FOR A FAST HEART RATE ,IF AFTER 12 HOURS STILL PRESENT TAKE 200 MG ( 1 TABLET) ONE DOSE FOR EACH EPISODE  . apixaban (ELIQUIS) 5 MG TABS tablet Take 1 tablet (5 mg total) by mouth 2 (two) times daily.  . Artificial Tear Solution (TEARS RENEWED OP) Apply 1 drop to eye daily as needed (dry eyes).  . carvedilol (COREG) 6.25 MG tablet TAKE 1 TABLET BY MOUTH TWICE DAILY WITH A MEAL. MAY TAKE AN EXTRA TABLET DAILY IF HAVING PALPATIONS (Patient taking differently: Take 6.25 mg by mouth 2 (two) times daily with a meal.)  . colchicine 0.6 MG tablet Take by mouth.  . cyanocobalamin (VITAMIN B12) 1000 MCG/ML injection 1000 MCG INJECTION ONCE PER MONTH. PLEASE PROVIDE SYRINGES AS WELL  . diclofenac sodium (VOLTAREN) 1 % GEL Apply 2 g topically 4 (four) times daily as needed (pain).  . furosemide (LASIX) 80 MG tablet Take 80 mg by mouth 2 (two) times daily.  Marland Kitchen HYDROcodone-acetaminophen (NORCO/VICODIN) 5-325 MG tablet Take 1 tablet by mouth every 6 (six) hours as needed for moderate pain or severe pain (do not take tylenol with this. do not drive for 8  hours after taking.).  Marland Kitchen hyoscyamine (ANASPAZ) 0.125 MG TBDP disintergrating tablet Place 1 tablet (0.125 mg total) under the tongue every 6 (six) hours as needed (abdominal cramping).  .  indapamide (LOZOL) 2.5 MG tablet TAKE ONE TABLET BY MOUTH DAILY, 30 MINUTES BEFORE DAILY FUROSEMIDE.  Marland Kitchen lidocaine (XYLOCAINE) 5 % ointment Apply 1 application. topically as needed.  Marland Kitchen losartan (COZAAR) 50 MG tablet TAKE 1 TABLET BY MOUTH EVERY DAY  . meclizine (ANTIVERT) 12.5 MG tablet TAKE 1 TABLET BY MOUTH THREE TIMES A DAY AS NEEDED FOR DIZZINESS  . spironolactone (ALDACTONE) 25 MG tablet TAKE 1 TABLET BY MOUTH AS DIRECTED. TAKE 1 TABLET ON MONDAYS, WEDNESDAYS, AND FRIDAYS AT 6 PM.  . []  isosorbide mononitrate (IMDUR) 30 MG 24 hr tablet TAKE 1 TABLET BY MOUTH EVERY DAY  . []  nitroGLYCERIN (NITROSTAT) 0.4 MG SL tablet DISSOLVE 1 TABLET UNDER THE TONGUE EVERY 5 MINUTES AS NEEDED FOR CHEST PAIN    Allergies  Allergen Reactions  . Codeine Phosphate Anaphylaxis, Shortness Of Breath and Swelling  . Contrast Media [Iodinated Contrast Media] Swelling    Swelling of the face. Reaction was to ionic contrast many years ago. Patient has had non-ionic contrast many time without pre medication and has had no reaction.   . Statins Other (See Comments)    Hyperactivity  . Tramadol Palpitations and Other (See Comments)    Hyperactivity    SOCIAL HISTORY/FAMILY HISTORY   Reviewed in Epic:  Pertinent findings: Never smoked.  No alcohol.   Social History   Social History Narrative   Married 63 years in 2023. 1 child Conne in [place].  1 grandchild in [place].       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       Hobbies: cooking/baking, loves to feed people    OBJCTIVE -PE, EKG, labs   Wt Readings from Last 3 Encounters:  02/19/23 198 lb 6.4 oz (90 kg)  02/19/23 196 lb 12.8 oz (89.3 kg)  02/05/23 198 lb (89.8 kg)    Physical Exam: BP (!) 124/56 (BP Location: Left Arm, Patient Position: Sitting, Cuff Size: Large)   Pulse 84   Ht 5\' 8"   (1.727 m)   Wt 196 lb 12.8 oz (89.3 kg)   SpO2 94%   BMI 29.92 kg/m  Physical Exam Vitals reviewed.  Constitutional:      General: She is not in acute distress.    Appearance: Normal appearance. She is not ill-appearing or toxic-appearing.     Comments: Borderline Obese, otherwise well-nourished well-groomed.  HENT:     Head: Normocephalic and atraumatic.  Neck:     Vascular: No carotid bruit.  Cardiovascular:     Rate and Rhythm: Normal rate and regular rhythm.     Pulses: Normal pulses.     Heart sounds: Normal heart sounds. No murmur heard.    No friction rub. No gallop.  Pulmonary:     Effort: Pulmonary effort is normal. No respiratory distress.     Breath sounds: Normal breath sounds. No wheezing or rales.  Chest:     Chest wall: Tenderness (Costosternal tenderness along the through fifth ribs.  Worse on the left.) present.  Musculoskeletal:        General: Swelling (Puffy swelling lips 4 seconds in place.) present. Normal range of motion.     Cervical back:  Normal range of motion and neck supple.  Skin:    General: Skin is warm and dry.     Coloration: Skin is not jaundiced or pale.     Findings: No bruising.  Neurological:     General: No focal deficit present.     Mental Status: She is alert and oriented to person, place, and time.     Gait: Gait abnormal (Unsteady gait).  Psychiatric:        Mood and Affect: Mood normal.        Behavior: Behavior normal.        Thought Content: Thought content normal.        Judgment: Judgment normal.     Comments: Still somewhat stubborn about treating lipids.     Adult ECG Report Not checked  Recent Labs: Reviewed   Lab Results  Component Value Date   CHOL 229 (H) 01/10/2022   HDL 36.90 (L) 01/10/2022   LDLCALC 152 (H) 09/08/2019   LDLDIRECT 147.0 01/10/2022   TRIG 327.0 (H) 01/10/2022   CHOLHDL 6 01/10/2022   Lab Results  Component Value Date   CREATININE 0.96 02/05/2023   BUN 22 02/05/2023   NA 136  02/05/2023   K 4.3 02/05/2023   CL 100 02/05/2023   CO2 27 02/05/2023      Latest Ref Rng & Units 02/05/2023   10:53 AM 10/17/2022    4:28 PM 07/22/2022   11:25 AM  CBC  WBC 4.0 - 10.5 K/uL 7.9  9.4  7.0   Hemoglobin 12.0 - 15.0 g/dL 24.4  01.0  27.2   Hematocrit 36.0 - 46.0 % 34.1  34.8  37.0   Platelets 150 - 400 K/uL 250  292.0  268.0     Lab Results  Component Value Date   HGBA1C 6.4 12/27/2022   Lab Results  Component Value Date   TSH 3.050 04/29/2022    ================================================== I spent a total of 28 minutes with the patient spent in direct patient consultation.  Additional time spent with chart review  / charting (studies, outside notes, etc): 16 min Total Time: 44 min  Current medicines are reviewed at length with the patient today.  (+/- concerns) none  Notice: This dictation was prepared with Dragon dictation along with smart phrase technology. Any transcriptional errors that result from this process are unintentional and may not be corrected upon review.  Studies Ordered:   No orders of the defined types were placed in this encounter.  Meds ordered this encounter  Medications  . isosorbide mononitrate (IMDUR) 30 MG 24 hr tablet    Sig: Take 1 tablet (30 mg total) by mouth daily. May take an extra dose if you use Nitroglycerin  sublingual tablet for 2 days afterwards    Dispense:  120 tablet    Refill:  3    Direction changed , discontinue previous dose  . nitroGLYCERIN (NITROSTAT) 0.4 MG SL tablet    Sig: DISSOLVE 1 TABLET UNDER THE TONGUE EVERY 5 MINUTES AS NEEDED FOR CHEST PAIN    Dispense:  25 tablet    Refill:  6    Patient Instructions / Medication Changes & Studies & Tests Ordered   Patient Instructions  Medication Instructions:     Continue  taking medications  If you use Nitroglycerin tabs- take an extra dose IMDUR  that day , then take an extra dose the following dose.  *If you need a refill on your cardiac  medications before your next appointment,  please call your pharmacy*   Lab Work:  Not needed   Testing/Procedures: Not needed   Follow-Up: At [facility], you and your health needs are our priority.  As part of our continuing mission to provide you with exceptional heart care, we have created designated Provider Care Teams.  These Care Teams include your primary Cardiologist (physician) and Advanced Practice Providers (APPs -  Physician Assistants and Nurse Practitioners) who all work together to provide you with the care you need, when you need it.     Your next appointment:   4 month(s)  The format for your next appointment:   In Person  Provider:   Bryan Lemma, MD         Marykay Lex, MD, MS Bryan Lemma, M.D., M.S. Interventional Cardiologist  [facility], [facility]  Pager # [phone] Phone # [phone] [address] [place], [place] [address]   Thank you for choosing [facility] [place]!!

## 2023-02-25 NOTE — Assessment & Plan Note (Signed)
Status post pacemaker implant.  Fleeting bouts of PAT but no recurrent A-fib.  Continue carvedilol at current dose along with as needed use of amiodarone for breakthrough spells.  Overall seems to be pretty well rate/rhythm controlled.  No changes at this point.  On Eliquis with no bleeding issues.

## 2023-02-25 NOTE — Assessment & Plan Note (Addendum)
Followed by Dr. Leone Payor, Dr. Gery Pray and Dr. Myra Gianotti. Major risk factor is her weight and presence of amiodarone.  Per GI

## 2023-02-25 NOTE — Assessment & Plan Note (Signed)
Probably due to Something New on Monitor.  Short Bursts of A-Fib.

## 2023-02-25 NOTE — Assessment & Plan Note (Signed)
Chronic costochondritis pain.  Her chest pain that she has had since her PCI has been costochondritis related.  We do stress, start self cooking.

## 2023-02-25 NOTE — Assessment & Plan Note (Signed)
Pretty well-controlled with monitor and pacemaker.  Short little bursts.  Prolonged bursts she has  PRN amiodarone.

## 2023-02-25 NOTE — Assessment & Plan Note (Signed)
No further episodes

## 2023-02-25 NOTE — Assessment & Plan Note (Signed)
BP seems stable on carvedilol 6.25 mg twice daily, losartan 50 mg daily, spironolactone 25 mg every other day continue to monitor.

## 2023-02-25 NOTE — Assessment & Plan Note (Signed)
NYHA class IIa symptoms.  Most related diastolic dysfunction.  I think most of her edema is probably more related to venous stasis and not CHF as there is no real PND or orthopnea.  Plan: Continue combination of losartan, Imdur, carvedilol as well as 50 mg losartan along with intermittent spironolactone.  Continue using 80 mg twice daily Lasix but sliding scale based on daily weight.  Also uses intermittent close all.

## 2023-02-25 NOTE — Assessment & Plan Note (Signed)
He has pretty significant lower extremity swelling.  Need to use more stockings and clearance.  Recommend foot elevation.  Will try to place ankle Unaboot's tunnel and rest..

## 2023-02-26 ENCOUNTER — Ambulatory Visit
Admission: RE | Admit: 2023-02-26 | Discharge: 2023-02-26 | Disposition: A | Discharge status: Home / Self Care | Payer: Medicare Other | Source: Ambulatory Visit | Attending: Radiation Oncology | Admitting: Radiation Oncology

## 2023-02-26 DIAGNOSIS — C499 Malignant neoplasm of connective and soft tissue, unspecified: Secondary | ICD-10-CM

## 2023-02-26 DIAGNOSIS — C4922 Malignant neoplasm of connective and soft tissue of left lower limb, including hip: Secondary | ICD-10-CM | POA: Diagnosis not present

## 2023-03-03 ENCOUNTER — Other Ambulatory Visit: Payer: Self-pay | Admitting: Family Medicine

## 2023-03-03 DIAGNOSIS — C4922 Malignant neoplasm of connective and soft tissue of left lower limb, including hip: Secondary | ICD-10-CM | POA: Diagnosis not present

## 2023-03-03 DIAGNOSIS — Z510 Encounter for antineoplastic radiation therapy: Secondary | ICD-10-CM | POA: Insufficient documentation

## 2023-03-03 DIAGNOSIS — C499 Malignant neoplasm of connective and soft tissue, unspecified: Secondary | ICD-10-CM | POA: Insufficient documentation

## 2023-03-03 DIAGNOSIS — H8110 Benign paroxysmal vertigo, unspecified ear: Secondary | ICD-10-CM

## 2023-03-05 ENCOUNTER — Other Ambulatory Visit: Payer: Self-pay

## 2023-03-05 ENCOUNTER — Ambulatory Visit
Admission: RE | Admit: 2023-03-05 | Discharge: 2023-03-05 | Disposition: A | Discharge status: Home / Self Care | Payer: Medicare Other | Source: Ambulatory Visit | Attending: Radiation Oncology | Admitting: Radiation Oncology

## 2023-03-05 DIAGNOSIS — C499 Malignant neoplasm of connective and soft tissue, unspecified: Secondary | ICD-10-CM

## 2023-03-05 DIAGNOSIS — C4922 Malignant neoplasm of connective and soft tissue of left lower limb, including hip: Secondary | ICD-10-CM | POA: Diagnosis not present

## 2023-03-05 DIAGNOSIS — Z510 Encounter for antineoplastic radiation therapy: Secondary | ICD-10-CM | POA: Diagnosis not present

## 2023-03-05 LAB — RAD ONC ARIA SESSION SUMMARY
Course Elapsed Days: 0
Plan Fractions Treated to Date: 1
Plan Prescribed Dose Per Fraction: 1.8 Gy
Plan Total Fractions Prescribed: 38
Plan Total Prescribed Dose: 68.4 Gy
Reference Point Dosage Given to Date: 1.8 Gy
Reference Point Session Dosage Given: 1.8 Gy
Session Number: 1

## 2023-03-07 ENCOUNTER — Ambulatory Visit
Admission: RE | Admit: 2023-03-07 | Discharge: 2023-03-07 | Disposition: A | Discharge status: Home / Self Care | Payer: Medicare Other | Source: Ambulatory Visit | Attending: Radiation Oncology | Admitting: Radiation Oncology

## 2023-03-07 ENCOUNTER — Other Ambulatory Visit: Payer: Self-pay

## 2023-03-07 DIAGNOSIS — C4922 Malignant neoplasm of connective and soft tissue of left lower limb, including hip: Secondary | ICD-10-CM | POA: Diagnosis not present

## 2023-03-07 DIAGNOSIS — Z510 Encounter for antineoplastic radiation therapy: Secondary | ICD-10-CM | POA: Diagnosis not present

## 2023-03-07 LAB — RAD ONC ARIA SESSION SUMMARY
Course Elapsed Days: 2
Plan Fractions Treated to Date: 2
Plan Prescribed Dose Per Fraction: 1.8 Gy
Plan Total Fractions Prescribed: 38
Plan Total Prescribed Dose: 68.4 Gy
Reference Point Dosage Given to Date: 3.6 Gy
Reference Point Session Dosage Given: 1.8 Gy
Session Number: 2

## 2023-03-10 ENCOUNTER — Ambulatory Visit
Admission: RE | Admit: 2023-03-10 | Discharge: 2023-03-10 | Disposition: A | Discharge status: Home / Self Care | Payer: Medicare Other | Source: Ambulatory Visit | Attending: Radiation Oncology | Admitting: Radiation Oncology

## 2023-03-10 ENCOUNTER — Ambulatory Visit (INDEPENDENT_AMBULATORY_CARE_PROVIDER_SITE_OTHER): Payer: Medicare Other

## 2023-03-10 ENCOUNTER — Other Ambulatory Visit: Payer: Self-pay

## 2023-03-10 VITALS — Wt 198.0 lb

## 2023-03-10 DIAGNOSIS — Z0000 Encounter for general adult medical examination without abnormal findings: Secondary | ICD-10-CM

## 2023-03-10 DIAGNOSIS — C4922 Malignant neoplasm of connective and soft tissue of left lower limb, including hip: Secondary | ICD-10-CM | POA: Diagnosis not present

## 2023-03-10 DIAGNOSIS — Z510 Encounter for antineoplastic radiation therapy: Secondary | ICD-10-CM | POA: Diagnosis not present

## 2023-03-10 LAB — RAD ONC ARIA SESSION SUMMARY
Course Elapsed Days: 5
Plan Fractions Treated to Date: 3
Plan Prescribed Dose Per Fraction: 1.8 Gy
Plan Total Fractions Prescribed: 38
Plan Total Prescribed Dose: 68.4 Gy
Reference Point Dosage Given to Date: 5.4 Gy
Reference Point Session Dosage Given: 1.8 Gy
Session Number: 3

## 2023-03-10 NOTE — Patient Instructions (Signed)
[name] , Thank you for taking time to come for your Medicare Wellness Visit. I appreciate your ongoing commitment to your health goals. Please review the following plan we discussed and let me know if I can assist you in the future.   These are the goals we discussed:  Goals      Patient Stated     None at this time         This is a list of the screening recommended for you and due dates:  Health Maintenance  Topic Date Due   Eye exam for diabetics  Never done   Pneumonia Vaccine (1 of 1 - PCV) Never done   Flu Shot  04/03/2023   Yearly kidney health urinalysis for diabetes  05/23/2023   Hemoglobin A1C  06/28/2023   Complete foot exam   07/23/2023   Yearly kidney function blood test for diabetes  02/05/2024   Medicare Annual Wellness Visit  03/09/2024   DEXA scan (bone density measurement)  Completed   Zoster (Shingles) Vaccine  Completed   HPV Vaccine  Aged Out   DTaP/Tdap/Td vaccine  Discontinued   COVID-19 Vaccine  Discontinued   Hepatitis C Screening  Discontinued    Advanced directives: Advance directive discussed with you today. I have provided a copy for you to complete at home and have notarized. Once this is complete please bring a copy in to our office so we can scan it into your chart.   Conditions/risks identified: stay healthy   Next appointment: Follow up in one year for your annual wellness visit    Preventive Care 65 Years and Older, Female Preventive care refers to lifestyle choices and visits with your health care provider that can promote health and wellness. What does preventive care include? A yearly physical exam. This is also called an annual well check. Dental exams once or twice a year. Routine eye exams. Ask your health care provider how often you should have your eyes checked. Personal lifestyle choices, including: Daily care of your teeth and gums. Regular physical activity. Eating a healthy diet. Avoiding tobacco and drug use. Limiting  alcohol use. Practicing safe sex. Taking low-dose aspirin every day. Taking vitamin and mineral supplements as recommended by your health care provider. What happens during an annual well check? The services and screenings done by your health care provider during your annual well check will depend on your age, overall health, lifestyle risk factors, and family history of disease. Counseling  Your health care provider may ask you questions about your: Alcohol use. Tobacco use. Drug use. Emotional well-being. Home and relationship well-being. Sexual activity. Eating habits. History of falls. Memory and ability to understand (cognition). Work and work Astronomer. Reproductive health. Screening  You may have the following tests or measurements: Height, weight, and BMI. Blood pressure. Lipid and cholesterol levels. These may be checked every 5 years, or more frequently if you are over 62 years old. Skin check. Lung cancer screening. You may have this screening every year starting at age 72 if you have a 30-pack-year history of smoking and currently smoke or have quit within the past 15 years. Fecal occult blood test (FOBT) of the stool. You may have this test every year starting at age 79. Flexible sigmoidoscopy or colonoscopy. You may have a sigmoidoscopy every 5 years or a colonoscopy every 10 years starting at age 40. Hepatitis C blood test. Hepatitis B blood test. Sexually transmitted disease (STD) testing. Diabetes screening. This is done by  checking your blood sugar (glucose) after you have not eaten for a while (fasting). You may have this done every 1-3 years. Bone density scan. This is done to screen for osteoporosis. You may have this done starting at age 33. Mammogram. This may be done every 1-2 years. Talk to your health care provider about how often you should have regular mammograms. Talk with your health care provider about your test results, treatment options, and if  necessary, the need for more tests. Vaccines  Your health care provider may recommend certain vaccines, such as: Influenza vaccine. This is recommended every year. Tetanus, diphtheria, and acellular pertussis (Tdap, Td) vaccine. You may need a Td booster every 10 years. Zoster vaccine. You may need this after age 38. Pneumococcal 13-valent conjugate (PCV13) vaccine. One dose is recommended after age 55. Pneumococcal polysaccharide (PPSV23) vaccine. One dose is recommended after age 67. Talk to your health care provider about which screenings and vaccines you need and how often you need them. This information is not intended to replace advice given to you by your health care provider. Make sure you discuss any questions you have with your health care provider. Document Released: 09/15/2015 Document Revised: 05/08/2016 Document Reviewed: 06/20/2015 Elsevier Interactive Patient Education  2017 [employer].  Fall Prevention in the Home Falls can cause injuries. They can happen to people of all ages. There are many things you can do to make your home safe and to help prevent falls. What can I do on the outside of my home? Regularly fix the edges of walkways and driveways and fix any cracks. Remove anything that might make you trip as you walk through a door, such as a raised step or threshold. Trim any bushes or trees on the path to your home. Use bright outdoor lighting. Clear any walking paths of anything that might make someone trip, such as rocks or tools. Regularly check to see if handrails are loose or broken. Make sure that both sides of any steps have handrails. Any raised decks and porches should have guardrails on the edges. Have any leaves, snow, or ice cleared regularly. Use sand or salt on walking paths during winter. Clean up any spills in your garage right away. This includes oil or grease spills. What can I do in the bathroom? Use night lights. Install grab bars by the toilet  and in the tub and shower. Do not use towel bars as grab bars. Use non-skid mats or decals in the tub or shower. If you need to sit down in the shower, use a plastic, non-slip stool. Keep the floor dry. Clean up any water that spills on the floor as soon as it happens. Remove soap buildup in the tub or shower regularly. Attach bath mats securely with double-sided non-slip rug tape. Do not have throw rugs and other things on the floor that can make you trip. What can I do in the bedroom? Use night lights. Make sure that you have a light by your bed that is easy to reach. Do not use any sheets or blankets that are too big for your bed. They should not hang down onto the floor. Have a firm chair that has side arms. You can use this for support while you get dressed. Do not have throw rugs and other things on the floor that can make you trip. What can I do in the kitchen? Clean up any spills right away. Avoid walking on wet floors. Keep items that you use a  lot in easy-to-reach places. If you need to reach something above you, use a strong step stool that has a grab bar. Keep electrical cords out of the way. Do not use floor polish or wax that makes floors slippery. If you must use wax, use non-skid floor wax. Do not have throw rugs and other things on the floor that can make you trip. What can I do with my stairs? Do not leave any items on the stairs. Make sure that there are handrails on both sides of the stairs and use them. Fix handrails that are broken or loose. Make sure that handrails are as long as the stairways. Check any carpeting to make sure that it is firmly attached to the stairs. Fix any carpet that is loose or worn. Avoid having throw rugs at the top or bottom of the stairs. If you do have throw rugs, attach them to the floor with carpet tape. Make sure that you have a light switch at the top of the stairs and the bottom of the stairs. If you do not have them, ask someone to add  them for you. What else can I do to help prevent falls? Wear shoes that: Do not have high heels. Have rubber bottoms. Are comfortable and fit you well. Are closed at the toe. Do not wear sandals. If you use a stepladder: Make sure that it is fully opened. Do not climb a closed stepladder. Make sure that both sides of the stepladder are locked into place. Ask someone to hold it for you, if possible. Clearly mark and make sure that you can see: Any grab bars or handrails. First and last steps. Where the edge of each step is. Use tools that help you move around (mobility aids) if they are needed. These include: Canes. Walkers. Scooters. Crutches. Turn on the lights when you go into a dark area. Replace any light bulbs as soon as they burn out. Set up your furniture so you have a clear path. Avoid moving your furniture around. If any of your floors are uneven, fix them. If there are any pets around you, be aware of where they are. Review your medicines with your doctor. Some medicines can make you feel dizzy. This can increase your chance of falling. Ask your doctor what other things that you can do to help prevent falls. This information is not intended to replace advice given to you by your health care provider. Make sure you discuss any questions you have with your health care provider. Document Released: 06/15/2009 Document Revised: 01/25/2016 Document Reviewed: 09/23/2014 Elsevier Interactive Patient Education  2017 [employer].

## 2023-03-10 NOTE — Progress Notes (Signed)
Subjective:   [name] is a 82 y.o. female who presents for Medicare Annual (Subsequent) preventive examination.  Visit Complete: Virtual  I connected with  [name] on 03/10/23 by a audio enabled telemedicine application and verified that I am speaking with the correct person using two identifiers.  Patient Location: Home  Provider Location: Office/Clinic  I discussed the limitations of evaluation and management by telemedicine. The patient expressed understanding and agreed to proceed.  Review of Systems     Cardiac Risk Factors include: advanced age (>25men, >12 women);hypertension;dyslipidemia;diabetes mellitus;obesity (BMI >30kg/m2)     Objective:    Today's Vitals   03/10/23 1110  Weight: 198 lb (89.8 kg)   Body mass index is 30.11 kg/m.     02/19/2023    2:32 PM 02/05/2023   11:55 AM 02/26/2022   10:24 AM 11/27/2021   12:31 PM 07/09/2021   12:05 PM 03/16/2018    9:24 AM 09/09/2017   11:42 PM  Advanced Directives  Does Patient Have a Medical Advance Directive? No No No No No No No  Would patient like information on creating a medical advance directive? No - Patient declined No - Patient declined Yes (MAU/Ambulatory/Procedural Areas - Information given)  No - Patient declined No - Patient declined Yes (Inpatient - patient defers creating a medical advance directive at this time)    Current Medications (verified) Outpatient Encounter Medications as of 03/10/2023  Medication Sig   alendronate (FOSAMAX) 70 MG tablet Take 1 tablet (70 mg total) by mouth every 7 (seven) days. Take with a full glass of water on an empty stomach.   allopurinol (ZYLOPRIM) 100 MG tablet Take 2 tablets (200 mg total) by mouth daily.   amiodarone (PACERONE) 200 MG tablet TAKE 400 MG (2 TABLETS) ONE DOSE AS NEED FOR A FAST HEART RATE ,IF AFTER 12 HOURS STILL PRESENT TAKE 200 MG ( 1 TABLET) ONE DOSE FOR EACH EPISODE   apixaban (ELIQUIS) 5 MG TABS tablet Take 1 tablet (5 mg total) by mouth 2  (two) times daily.   Artificial Tear Solution (TEARS RENEWED OP) Apply 1 drop to eye daily as needed (dry eyes).   carvedilol (COREG) 6.25 MG tablet TAKE 1 TABLET BY MOUTH TWICE DAILY WITH A MEAL. MAY TAKE AN EXTRA TABLET DAILY IF HAVING PALPATIONS (Patient taking differently: Take 6.25 mg by mouth 2 (two) times daily with a meal.)   colchicine 0.6 MG tablet Take by mouth.   cyanocobalamin (VITAMIN B12) 1000 MCG/ML injection 1000 MCG INJECTION ONCE PER MONTH. PLEASE PROVIDE SYRINGES AS WELL   diclofenac sodium (VOLTAREN) 1 % GEL Apply 2 g topically 4 (four) times daily as needed (pain).   furosemide (LASIX) 80 MG tablet Take 80 mg by mouth 2 (two) times daily.   hyoscyamine (ANASPAZ) 0.125 MG TBDP disintergrating tablet Place 1 tablet (0.125 mg total) under the tongue every 6 (six) hours as needed (abdominal cramping).   indapamide (LOZOL) 2.5 MG tablet TAKE ONE TABLET BY MOUTH DAILY, 30 MINUTES BEFORE DAILY FUROSEMIDE.   isosorbide mononitrate (IMDUR) 30 MG 24 hr tablet Take 1 tablet (30 mg total) by mouth daily. May take an extra dose if you use Nitroglycerin  sublingual tablet for 2 days afterwards   lidocaine (XYLOCAINE) 5 % ointment Apply 1 application. topically as needed.   losartan (COZAAR) 50 MG tablet TAKE 1 TABLET BY MOUTH EVERY DAY   meclizine (ANTIVERT) 12.5 MG tablet TAKE 1 TABLET BY MOUTH THREE TIMES A DAY AS NEEDED  FOR DIZZINESS   nitroGLYCERIN (NITROSTAT) 0.4 MG SL tablet DISSOLVE 1 TABLET UNDER THE TONGUE EVERY 5 MINUTES AS NEEDED FOR CHEST PAIN   spironolactone (ALDACTONE) 25 MG tablet TAKE 1 TABLET BY MOUTH AS DIRECTED. TAKE 1 TABLET ON MONDAYS, WEDNESDAYS, AND FRIDAYS AT 6 PM.   [DISCONTINUED] HYDROcodone-acetaminophen (NORCO/VICODIN) 5-325 MG tablet Take 1 tablet by mouth every 6 (six) hours as needed for moderate pain or severe pain (do not take tylenol with this. do not drive for 8 hours after taking.).   No facility-administered encounter medications on file as of 03/10/2023.     Allergies (verified) Codeine phosphate, Contrast media [iodinated contrast media], Statins, and Tramadol   History: Past Medical History:  Diagnosis Date   Adrenal adenoma    Arthritis    "fingers, right shoulder" (09/09/2017)   Atrial fibrillation (HCC) 12/2021   New diagnosis noted on pacemaker interrogation.   Back pain with radiation    Chronic Back Pain - mutliple surgeries (including tumor removal)   Bilateral edema of lower extremity    Chronic, likely related to venous stasis   Bradycardia    Pacemaker placed   CAD S/P percutaneous coronary angioplasty    a) LHC: 07/30/10. -- 3.0x42mm Integrity BMS to pRCA & 2.5x 15mm Integrity BMS mLAD(@ D2).  b) Class III-IV Angina 03/2011: LHC- ISR in LAD BMS -- prox overlapping Promus DES 2.5x26mm and PTCA of jailed D2 ostium-prox 80%. c) 02/03/12:  LHC- patent stents.  Jailed diagonal. with stable flow; d) Peri-OP NSTEMI 04/2012 - LHC in 12/'13 -    Celiac artery stenosis (HCC)    12/2016 - STENT placement   Complication of anesthesia    "used to wake up wild years ago" (09/09/2017)   Congestive heart failure with LV diastolic dysfunction, NYHA class 2 (HCC)    06/13/10:  last 2D echo-  EF >55%, Mild TR, Mod Conc LVH - Grade 1 diastolic dysfunction (abnormal relaxation) --> LVEDP on Cath 28 mmHg & mild 2nd Pulm HTN   Diet-controlled type 2 diabetes mellitus (HCC)    Diverticulitis of colon (without mention of hemorrhage)(562.11)    Diverticulosis    Dyslipidemia, goal LDL below 70    Intolerant to statins   GERD (gastroesophageal reflux disease)    Hepatitis ~ 1957   "yellow jaundice" (01/14/2017)   Hepatomegaly    Hiatal hernia    History of blood transfusion 04/2012   "when I had a heart attack"   History of kidney stones    "I've got a stone embedded in one of my kidneys" (09/09/2017)   History of stomach ulcers "years ago"   Irritable bowel syndrome (IBS)    Labile essential hypertension    Partially related to RAS   Mesenteric  artery stenosis (HCC)    95% Celiac Artery - ostial, 20-30% SMA.  Bilateral Renal A: L RA stent patent, R RA 20-30% -- Conservative Management   Mobitz type 2 second degree AV block    Status post PPM placement   NSTEMI (non-ST elevated myocardial infarction) (HCC) 04/2012   Unclear the details, apparently this was postoperative from her back surgery that she was cleared for my last saw her in June.  Reportedly had stents placed   Pancreas divisum    On pancreatic enzyme   PAT (paroxysmal atrial tachycardia)    Renal artery stenosis (HCC) 2011; 12/'13   a) Angiogram 02/03/12:  50-60%L RA stenosis, 40% R R Inferior artery; b) 12/'13: S/P L RA Stent ([facility])  6.0 mm x 15 mm; c) Renal Duplex 10/2013: <60% L RA, <60 R RA, ~60% SMA & Celiac A.   S/P placement of cardiac pacemaker 07/2021   Medtronic   Stricture and stenosis of esophagus    Thyroid nodule    Unspecified gastritis and gastroduodenitis without mention of hemorrhage    Past Surgical History:  Procedure Laterality Date   APPENDECTOMY     BACK SURGERY     BREAST DUCTAL SYSTEM EXCISION     right   BREAST SURGERY Right    CARDIAC CATHETERIZATION N/A 03/16/2015   Procedure: Left Heart Cath and Coronary Angiography;  Surgeon: Marykay Lex, MD;  Location: [facility] INVASIVE CV LAB;  Service: Cardiovascular; Widely patent m-dLAD stents as wellas pRCA stent.  High LVEDP, small Diag & Om vessels with moderate stenosis    Cardiac Event Monitor  01/2017   Mostly NSR with occasional sinus tachycardia and rare bradycardia. No A. fib. No PND or PSVT. Rare PACs and PVCs.   CATARACT EXTRACTION W/ INTRAOCULAR LENS  IMPLANT, BILATERAL Bilateral    CHOLECYSTECTOMY OPEN     COLONOSCOPY  03/01/2009   normal    CORONARY ANGIOPLASTY WITH STENT PLACEMENT  07/30/2010   3.0x41mm Integrity BMS to RCA and 2.5x 15mm Integrity BMS LAD.     CORONARY ANGIOPLASTY WITH STENT PLACEMENT  03/18/2011   Cutting Balloon PTCA of D2 (jailed - 80% ostial stenosis), DES  PCI of mid LAD ISR - > Promus DES 2.5 x 16 mm postdilated to 2.8 mm) covering the proximal portion of the previous stent   DOBUTAMINE STRESS ECHO  03/07/2015   [facility] (ordered for pre-op evaluation for EUS/ERCP --> abnormal EKG:  strreesss test shhoowwed 1 mm ST segment depressions downsloping. No wall motion abnormalities at peak exercise or at rest. Diastolic dysfunction was noted but normal systolic function - EF greater than 55%. No bouts of regurgitation or stenosis. Resting hypertension with exaggerated response   ESOPHAGOGASTRODUODENOSCOPY  04/08/2012   ESOPHAGOGASTRODUODENOSCOPY (EGD) WITH ESOPHAGEAL DILATION  X 2   FRACTURE SURGERY     HAMMER TOE SURGERY Bilateral    "took bone off the top of 2nd toe on each foot"   HIP SURGERY Left    "something to do w/my back"   JOINT REPLACEMENT     KNEE SURGERY Left    "had fluid drained off it a couple times"   LEFT HEART CATH AND CORONARY ANGIOGRAPHY  07/30/2010   severe LAD-diagonal 80%, moderate to severe proximal RCA.  Mean PAP 15 mmHg.  PCWP mean 17 mmHg.  RVP 45/11 mmHg.  PAP 47/24 mmHg, mean 31 mmHg.  LVEDP 27 mmHg   LEFT HEART CATHETERIZATION WITH CORONARY ANGIOGRAM N/A 02/03/2012   Procedure: LEFT HEART CATHETERIZATION WITH CORONARY ANGIOGRAM;  Surgeon: Marykay Lex, MD;  Location: [facility] CATH LAB; widely patent LAD and RCA stents.  Patent D2 ostium.  Moderate L Renal A stenosis (56%), R Renal A 40-50% t.  LVEDP 20 mmHg.   LEFT HEART CATHETERIZATION WITH CORONARY ANGIOGRAM N/A 05/12/2014   Procedure: LEFT HEART CATHETERIZATION WITH CORONARY ANGIOGRAM;  Surgeon: Runell Gess, MD;  Location: [facility] CATH LAB;  Service: Cardiovascular: Stable CAD. Patent stents. Patent renal artery stent   LEFT HEART CATHETERIZATION WITH CORONARY ANGIOGRAM  08/2012   Peri-Op MI @ [facility] -- 40% ostial D1, patent LAD stents, 10% RCA ISR   LEFT HEART CATHETERIZATION WITH CORONARY ANGIOGRAM   03/18/2011   70% ISR of LAD stent just after  D2 (D2 has ostial  80 to 90% stenosis.  20 to 30% ISR RCA.  EDP elevated at 28 mmHg   NM MYOVIEW LTD  12/2016   LOW RISK study. No ischemia or infarction. EF 65-75%.   NM MYOVIEW LTD  01/05/2020    LOW RISK. EF 60-65%.  No EKG changes.  No infarct, no ischemia.   PACEMAKER IMPLANT N/A 07/09/2021   Procedure: PACEMAKER IMPLANT;  Surgeon: Thurmon Fair, MD;  Location: [facility] INVASIVE CV LAB;  Service: Cardiovascular;  Laterality: N/A;   PANCREAS SURGERY     "stent in my pancreas"; Dr Woodroe Chen   PERCUTANEOUS PINNING PHALANX FRACTURE OF HAND Left ~ 2013   PERIPHERAL VASCULAR BALLOON ANGIOPLASTY  09/09/2017   Procedure: PERIPHERAL VASCULAR BALLOON ANGIOPLASTY;  Surgeon: Nada Libman, MD;  Location: [facility] INVASIVE CV LAB;  Service: Cardiovascular;;  Celiac instent   PERIPHERAL VASCULAR INTERVENTION  01/14/2017   Procedure: Peripheral Vascular Intervention;  Surgeon: Nada Libman, MD;  Location: [facility] INVASIVE CV LAB;  Service: Cardiovascular;;  mesentric   RENAL ARTERY STENT Left 08/2012   @ [facility] - 6.0 mm x 15 mm   SPINE SURGERY     tumor removed 07/2010; Redo Surgery 04/2012; Sacroiliac Sgx 08/2013   TOTAL ABDOMINAL HYSTERECTOMY     TOTAL KNEE ARTHROPLASTY Left ~ 2012   TRANSTHORACIC ECHOCARDIOGRAM  07/10/2021   (Postop PPM): Normal LV size and function.  EF 60 to 65%.  No RWMA.  GR 1 DD.  Mild LA dilation.  Normal RV size and function.  Normal RAP.  Normal aortic and mitral valves.  No pericardial effusion.   VISCERAL ANGIOGRAM N/A 05/12/2014   Procedure: VISCERAL ANGIOGRAM;  Surgeon: Runell Gess, MD;  Location: [facility] CATH LAB;  25% ostial proximal celiac artery with downward takeoff.  23% proximal SMA and 56% proximal IMA.  Left renal artery stent widely patent.  Right renal artery is 20 to 30% proximal stenosis.   VISCERAL ANGIOGRAPHY N/A 01/14/2017   Procedure: Mesenteric  Angiography;  Surgeon: Nada Libman, MD;  Location: [facility] INVASIVE CV LAB;  Service: Cardiovascular;  Laterality: N/A;    VISCERAL ANGIOGRAPHY N/A 09/09/2017   Procedure: VISCERAL ANGIOGRAPHY;  Surgeon: Nada Libman, MD;  Location: [facility] INVASIVE CV LAB;  Service: Cardiovascular;  Laterality: N/A;   Family History  Problem Relation Age of Onset   Stroke Mother    Cancer Father        mets   Heart attack Father    Heart disease Father    Heart attack Brother    Diabetes Brother    Heart attack Brother    Colitis Maternal Grandfather    Cancer Daughter    Colon cancer Neg Hx    Stomach cancer Neg Hx    Social History   Socioeconomic History   Marital status: Married    Spouse name: Not on file   Number of children: 1   Years of education: Not on file   Highest education level: Not on file  Occupational History   Occupation: Hotel manager    Employer: Production assistant, radio FOR SELF EMPLOYED    Comment: She takes cakes and pies to sell, also canned vegetables and makes Fayrene Fearing enjoys.  Tobacco Use   Smoking status: Never   Smokeless tobacco: Never  Vaping Use   Vaping Use: Never used  Substance and Sexual Activity   Alcohol use: No   Drug use: No   Sexual activity: Not Currently  Other Topics Concern   Not on file  Social History Narrative   Married 63 years in 2023. 1 child Conne in [place].  1 grandchild in [place].       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       Hobbies: cooking/baking, loves to feed people   Social Determinants of Health   Financial Resource Strain: Low Risk  (03/10/2023)   Overall Financial Resource Strain (CARDIA)    Difficulty of Paying Living Expenses: Not hard at all  Food Insecurity: No Food Insecurity (03/10/2023)   Hunger Vital Sign    Worried About Running Out of Food in the Last Year: Never true    Ran Out of Food in the Last Year: Never true  Transportation Needs: No Transportation Needs (03/10/2023)    PRAPARE - Administrator, Civil Service (Medical): No    Lack of Transportation (Non-Medical): No  Physical Activity: Inactive (03/10/2023)   Exercise Vital Sign    Days of Exercise per Week: 0 days    Minutes of Exercise per Session: 0 min  Stress: No Stress Concern Present (03/10/2023)   [employer] of Occupational Health - Occupational Stress Questionnaire    Feeling of Stress : Not at all  Social Connections: Moderately Integrated (03/10/2023)   Social Connection and Isolation Panel [NHANES]    Frequency of Communication with Friends and Family: More than three times a week    Frequency of Social Gatherings with Friends and Family: More than three times a week    Attends Religious Services: More than 4 times per year    Active Member of [employer] or Organizations: No    Attends Engineer, structural: Never    Marital Status: Married    Tobacco Counseling Counseling given: Not Answered   Clinical Intake:  Pre-visit preparation completed: Yes  Pain : No/denies pain     BMI - recorded: 30.11 Nutritional Status: BMI > 30  Obese Nutritional Risks: None Diabetes: Yes CBG done?: No Did pt. bring in CBG monitor from home?: No  How often do you need to have someone help you when you read instructions, pamphlets, or other written materials from your doctor or pharmacy?: 1 - Never  Interpreter Needed?: No  Information entered by :: Lanier Ensign, LPN   Activities of Daily Living    03/10/2023   11:11 AM  In your present state of health, do you have any difficulty performing the following activities:  Hearing? 0  Vision? 0  Difficulty concentrating or making decisions? 0  Walking or climbing stairs? 1  Comment SOB  Dressing or bathing? 0  Doing errands, shopping? 0  Preparing Food and eating ? N  Using the Toilet? N  In the past six months, have you accidently leaked urine? N  Do you have problems with loss of bowel control? N  Managing your  Medications? N  Managing your Finances? N  Housekeeping or managing your Housekeeping? N    Patient Care Team: Shelva Majestic, MD as PCP - General (Family Medicine) Marykay Lex, MD as PCP - Cardiology (Cardiology) Croitoru, Rachelle Hora, MD as PCP - Electrophysiology (Cardiology) Pyrtle, Carie Caddy, MD as Consulting Physician (Gastroenterology) Myna Hidalgo Rose Phi, MD as Medical Oncologist (Oncology) Gwendel Hanson, RN as Oncology Nurse Navigator  Indicate any recent Medical Services you [phone]  have received from other than [facility] providers in the past year (date may be approximate).     Assessment:   This is a routine wellness examination for [place].  Hearing/Vision screen Hearing Screening - Comments:: Pt denies any hearing issues  Vision Screening - Comments:: Pt follows up with Dr Sondra Barges for aNNUAL EYE EXAMS   Dietary issues and exercise activities discussed:     Goals Addressed             This Visit's Progress    Patient Stated       Stay healthy        Depression Screen    03/10/2023   11:14 AM 02/19/2023    2:37 PM 10/17/2022    3:25 PM 02/26/2022   10:23 AM 01/10/2022    9:00 AM  PHQ 2/9 Scores  PHQ - 2 Score 0 0 0 0 0  PHQ- 9 Score 0  3  0    Fall Risk    03/10/2023   11:17 AM 10/17/2022    3:25 PM 02/26/2022   10:25 AM  Fall Risk   Falls in the past year? 0 1 1  Number falls in past yr: 0 1 1  Injury with Fall? 0 1 0  Risk for fall due to : Impaired vision History of fall(s) Impaired balance/gait  Follow up Falls prevention discussed Falls evaluation completed Falls prevention discussed    MEDICARE RISK AT HOME:  Medicare Risk at Home - 03/10/23 1118     Any stairs in or around the home? Yes    If so, are there any without handrails? No    Home free of loose throw rugs in walkways, pet beds, electrical cords, etc? Yes    Adequate lighting in your home to reduce risk of falls? Yes    Life alert? No    Use of a cane, walker or w/c? No    Grab bars in the  bathroom? Yes    Shower chair or bench in shower? No    Elevated toilet seat or a handicapped toilet? No             TIMED UP AND GO:  Was the test performed?  No    Cognitive Function:        03/10/2023   11:18 AM 02/26/2022   10:28 AM  6CIT Screen  What Year? 0 points 0 points  What month? 0 points 0 points  What time? 0 points 0 points  Count back from 20 0 points 0 points  Months in reverse 0 points 0 points  Repeat phrase 0 points 0 points  Total Score 0 points 0 points    Immunizations Immunization History  Administered Date(s) Administered   Tdap 03/19/2012   Zoster Recombinant(Shingrix) 06/03/2022, 09/05/2022   Zoster, Live 03/31/2009    TDAP status: Up to date  Flu Vaccine status: Declined, Education has been provided regarding the importance of this vaccine but patient still declined. Advised may receive this vaccine at local pharmacy or Health Dept. Aware to provide a copy of the vaccination record if obtained from local pharmacy or Health Dept. Verbalized acceptance and understanding.  Pneumococcal vaccine status: Due, Education has been provided regarding the importance of this vaccine. Advised may receive this vaccine at local pharmacy or Health Dept. Aware to provide a copy of the vaccination record if obtained from local pharmacy or Health Dept. Verbalized acceptance and understanding.  Covid-19 vaccine status: Declined, Education has been provided regarding the  importance of this vaccine but patient still declined. Advised may receive this vaccine at local pharmacy or Health Dept.or vaccine clinic. Aware to provide a copy of the vaccination record if obtained from local pharmacy or Health Dept. Verbalized acceptance and understanding.  Qualifies for Shingles Vaccine? Yes   Zostavax completed Yes   Shingrix Completed?: Yes  Screening Tests Health Maintenance  Topic Date Due   OPHTHALMOLOGY EXAM  Never done   Pneumonia Vaccine 31+ Years old (1 of 1 -  PCV) 03/09/2024 (Originally 03/31/2007)   INFLUENZA VACCINE  04/03/2023   Diabetic kidney evaluation - Urine ACR  05/23/2023   HEMOGLOBIN A1C  06/28/2023   FOOT EXAM  07/23/2023   Diabetic kidney evaluation - eGFR measurement  02/05/2024   Medicare Annual Wellness (AWV)  03/09/2024   DEXA SCAN  Completed   Zoster Vaccines- Shingrix  Completed   HPV VACCINES  Aged Out   DTaP/Tdap/Td  Discontinued   COVID-19 Vaccine  Discontinued   Hepatitis C Screening  Discontinued    Health Maintenance  Health Maintenance Due  Topic Date Due   OPHTHALMOLOGY EXAM  Never done    Colorectal cancer screening: No longer required.   Mammogram status: No longer required due to AGE .  Bone Density status: Completed 01/21/22. Results reflect: Bone density results: OSTEOPOROSIS. Repeat every 2 years.   Additional Screening:  Hepatitis C Screening: Completed 01/10/22  Vision Screening: Recommended annual ophthalmology exams for early detection of glaucoma and other disorders of the eye. Is the patient up to date with their annual eye exam?  No  Who is the provider or what is the name of the office in which the patient attends annual eye exams? Dr Sondra Barges  If pt is not established with a provider, would they like to be referred to a provider to establish care? No .   Dental Screening: Recommended annual dental exams for proper oral hygiene  Diabetic Foot Exam: Diabetic Foot Exam: Completed 07/22/22  Community Resource Referral / Chronic Care Management: CRR required this visit?  No   CCM required this visit?  No     Plan:     I have personally reviewed and noted the following in the patient's chart:   Medical and social history Use of alcohol, tobacco or illicit drugs  Current medications and supplements including opioid prescriptions. Patient is not currently taking opioid prescriptions. Functional ability and status Nutritional status Physical activity Advanced directives List of other  physicians Hospitalizations, surgeries, and ER visits in previous 12 months Vitals Screenings to include cognitive, depression, and falls Referrals and appointments  In addition, I have reviewed and discussed with patient certain preventive protocols, quality metrics, and best practice recommendations. A written personalized care plan for preventive services as well as general preventive health recommendations were provided to patient.     Marzella Schlein, LPN   09/07/1094   After Visit Summary: (MyChart) Due to this being a telephonic visit, the after visit summary with patients personalized plan was offered to patient via MyChart   Nurse Notes: none

## 2023-03-11 ENCOUNTER — Other Ambulatory Visit: Payer: Self-pay

## 2023-03-11 ENCOUNTER — Ambulatory Visit
Admission: RE | Admit: 2023-03-11 | Discharge: 2023-03-11 | Disposition: A | Discharge status: Home / Self Care | Payer: Medicare Other | Source: Ambulatory Visit | Attending: Radiation Oncology | Admitting: Radiation Oncology

## 2023-03-11 ENCOUNTER — Ambulatory Visit: Admission: RE | Admit: 2023-03-11 | Payer: Medicare Other | Source: Ambulatory Visit

## 2023-03-11 DIAGNOSIS — C499 Malignant neoplasm of connective and soft tissue, unspecified: Secondary | ICD-10-CM

## 2023-03-11 DIAGNOSIS — C4922 Malignant neoplasm of connective and soft tissue of left lower limb, including hip: Secondary | ICD-10-CM | POA: Diagnosis not present

## 2023-03-11 DIAGNOSIS — Z510 Encounter for antineoplastic radiation therapy: Secondary | ICD-10-CM | POA: Diagnosis not present

## 2023-03-11 LAB — RAD ONC ARIA SESSION SUMMARY
Course Elapsed Days: 6
Plan Fractions Treated to Date: 4
Plan Prescribed Dose Per Fraction: 1.8 Gy
Plan Total Fractions Prescribed: 38
Plan Total Prescribed Dose: 68.4 Gy
Reference Point Dosage Given to Date: 7.2 Gy
Reference Point Session Dosage Given: 1.8 Gy
Session Number: 4

## 2023-03-11 MED ORDER — SONAFINE EX EMUL
1.0000 | Freq: Once | CUTANEOUS | Status: AC
  Administered 2023-03-11: 1 via TOPICAL

## 2023-03-12 ENCOUNTER — Other Ambulatory Visit: Payer: Self-pay

## 2023-03-12 ENCOUNTER — Ambulatory Visit
Admission: RE | Admit: 2023-03-12 | Discharge: 2023-03-12 | Disposition: A | Discharge status: Home / Self Care | Payer: Medicare Other | Source: Ambulatory Visit | Attending: Radiation Oncology | Admitting: Radiation Oncology

## 2023-03-12 DIAGNOSIS — C4922 Malignant neoplasm of connective and soft tissue of left lower limb, including hip: Secondary | ICD-10-CM | POA: Diagnosis not present

## 2023-03-12 DIAGNOSIS — Z510 Encounter for antineoplastic radiation therapy: Secondary | ICD-10-CM | POA: Diagnosis not present

## 2023-03-12 LAB — RAD ONC ARIA SESSION SUMMARY
Course Elapsed Days: 7
Plan Fractions Treated to Date: 5
Plan Prescribed Dose Per Fraction: 1.8 Gy
Plan Total Fractions Prescribed: 38
Plan Total Prescribed Dose: 68.4 Gy
Reference Point Dosage Given to Date: 9 Gy
Reference Point Session Dosage Given: 1.8 Gy
Session Number: 5

## 2023-03-13 ENCOUNTER — Other Ambulatory Visit: Payer: Self-pay

## 2023-03-13 ENCOUNTER — Ambulatory Visit
Admission: RE | Admit: 2023-03-13 | Discharge: 2023-03-13 | Disposition: A | Discharge status: Home / Self Care | Payer: Medicare Other | Source: Ambulatory Visit | Attending: Radiation Oncology | Admitting: Radiation Oncology

## 2023-03-13 DIAGNOSIS — C4922 Malignant neoplasm of connective and soft tissue of left lower limb, including hip: Secondary | ICD-10-CM | POA: Diagnosis not present

## 2023-03-13 DIAGNOSIS — Z510 Encounter for antineoplastic radiation therapy: Secondary | ICD-10-CM | POA: Diagnosis not present

## 2023-03-13 LAB — RAD ONC ARIA SESSION SUMMARY
Course Elapsed Days: 8
Plan Fractions Treated to Date: 6
Plan Prescribed Dose Per Fraction: 1.8 Gy
Plan Total Fractions Prescribed: 38
Plan Total Prescribed Dose: 68.4 Gy
Reference Point Dosage Given to Date: 10.8 Gy
Reference Point Session Dosage Given: 1.8 Gy
Session Number: 6

## 2023-03-14 ENCOUNTER — Ambulatory Visit: Payer: Medicare Other

## 2023-03-17 ENCOUNTER — Ambulatory Visit: Admission: RE | Admit: 2023-03-17 | Payer: Medicare Other | Source: Ambulatory Visit

## 2023-03-17 ENCOUNTER — Other Ambulatory Visit: Payer: Self-pay

## 2023-03-17 DIAGNOSIS — C4922 Malignant neoplasm of connective and soft tissue of left lower limb, including hip: Secondary | ICD-10-CM | POA: Diagnosis not present

## 2023-03-17 DIAGNOSIS — Z510 Encounter for antineoplastic radiation therapy: Secondary | ICD-10-CM | POA: Diagnosis not present

## 2023-03-17 LAB — RAD ONC ARIA SESSION SUMMARY
Course Elapsed Days: 12
Plan Fractions Treated to Date: 7
Plan Prescribed Dose Per Fraction: 1.8 Gy
Plan Total Fractions Prescribed: 38
Plan Total Prescribed Dose: 68.4 Gy
Reference Point Dosage Given to Date: 12.6 Gy
Reference Point Session Dosage Given: 1.8 Gy
Session Number: 7

## 2023-03-18 ENCOUNTER — Other Ambulatory Visit: Payer: Self-pay

## 2023-03-18 ENCOUNTER — Ambulatory Visit
Admission: RE | Admit: 2023-03-18 | Discharge: 2023-03-18 | Disposition: A | Discharge status: Home / Self Care | Payer: Medicare Other | Source: Ambulatory Visit | Attending: Radiation Oncology | Admitting: Radiation Oncology

## 2023-03-18 ENCOUNTER — Other Ambulatory Visit: Payer: Self-pay | Admitting: Family Medicine

## 2023-03-18 DIAGNOSIS — Z510 Encounter for antineoplastic radiation therapy: Secondary | ICD-10-CM | POA: Diagnosis not present

## 2023-03-18 DIAGNOSIS — C4922 Malignant neoplasm of connective and soft tissue of left lower limb, including hip: Secondary | ICD-10-CM | POA: Diagnosis not present

## 2023-03-18 LAB — RAD ONC ARIA SESSION SUMMARY
Course Elapsed Days: 13
Plan Fractions Treated to Date: 8
Plan Prescribed Dose Per Fraction: 1.8 Gy
Plan Total Fractions Prescribed: 38
Plan Total Prescribed Dose: 68.4 Gy
Reference Point Dosage Given to Date: 14.4 Gy
Reference Point Session Dosage Given: 1.8 Gy
Session Number: 8

## 2023-03-19 ENCOUNTER — Ambulatory Visit
Admission: RE | Admit: 2023-03-19 | Discharge: 2023-03-19 | Disposition: A | Discharge status: Home / Self Care | Payer: Medicare Other | Source: Ambulatory Visit | Attending: Radiation Oncology | Admitting: Radiation Oncology

## 2023-03-19 ENCOUNTER — Other Ambulatory Visit: Payer: Self-pay

## 2023-03-19 DIAGNOSIS — C4922 Malignant neoplasm of connective and soft tissue of left lower limb, including hip: Secondary | ICD-10-CM | POA: Diagnosis not present

## 2023-03-19 DIAGNOSIS — Z510 Encounter for antineoplastic radiation therapy: Secondary | ICD-10-CM | POA: Diagnosis not present

## 2023-03-19 LAB — RAD ONC ARIA SESSION SUMMARY
Course Elapsed Days: 14
Plan Fractions Treated to Date: 9
Plan Prescribed Dose Per Fraction: 1.8 Gy
Plan Total Fractions Prescribed: 38
Plan Total Prescribed Dose: 68.4 Gy
Reference Point Dosage Given to Date: 16.2 Gy
Reference Point Session Dosage Given: 1.8 Gy
Session Number: 9

## 2023-03-20 ENCOUNTER — Other Ambulatory Visit: Payer: Self-pay

## 2023-03-20 ENCOUNTER — Ambulatory Visit
Admission: RE | Admit: 2023-03-20 | Discharge: 2023-03-20 | Disposition: A | Discharge status: Home / Self Care | Payer: Medicare Other | Source: Ambulatory Visit | Attending: Radiation Oncology | Admitting: Radiation Oncology

## 2023-03-20 DIAGNOSIS — C4922 Malignant neoplasm of connective and soft tissue of left lower limb, including hip: Secondary | ICD-10-CM | POA: Diagnosis not present

## 2023-03-20 DIAGNOSIS — Z510 Encounter for antineoplastic radiation therapy: Secondary | ICD-10-CM | POA: Diagnosis not present

## 2023-03-20 LAB — RAD ONC ARIA SESSION SUMMARY
Course Elapsed Days: 15
Plan Fractions Treated to Date: 10
Plan Prescribed Dose Per Fraction: 1.8 Gy
Plan Total Fractions Prescribed: 38
Plan Total Prescribed Dose: 68.4 Gy
Reference Point Dosage Given to Date: 18 Gy
Reference Point Session Dosage Given: 1.8 Gy
Session Number: 10

## 2023-03-21 ENCOUNTER — Other Ambulatory Visit: Payer: Self-pay

## 2023-03-21 ENCOUNTER — Ambulatory Visit
Admission: RE | Admit: 2023-03-21 | Discharge: 2023-03-21 | Disposition: A | Discharge status: Home / Self Care | Payer: Medicare Other | Source: Ambulatory Visit | Attending: Radiation Oncology | Admitting: Radiation Oncology

## 2023-03-21 DIAGNOSIS — Z510 Encounter for antineoplastic radiation therapy: Secondary | ICD-10-CM | POA: Diagnosis not present

## 2023-03-21 DIAGNOSIS — C4922 Malignant neoplasm of connective and soft tissue of left lower limb, including hip: Secondary | ICD-10-CM | POA: Diagnosis not present

## 2023-03-21 LAB — RAD ONC ARIA SESSION SUMMARY
Course Elapsed Days: 16
Plan Fractions Treated to Date: 11
Plan Prescribed Dose Per Fraction: 1.8 Gy
Plan Total Fractions Prescribed: 38
Plan Total Prescribed Dose: 68.4 Gy
Reference Point Dosage Given to Date: 19.8 Gy
Reference Point Session Dosage Given: 1.8 Gy
Session Number: 11

## 2023-03-21 NOTE — Progress Notes (Signed)
Called to speak with [name] about the [employer], reached her voicemail, left her a message asking that she return my call.

## 2023-03-24 ENCOUNTER — Other Ambulatory Visit: Payer: Self-pay

## 2023-03-24 ENCOUNTER — Ambulatory Visit: Admission: RE | Admit: 2023-03-24 | Payer: Medicare Other | Source: Ambulatory Visit

## 2023-03-24 DIAGNOSIS — C4922 Malignant neoplasm of connective and soft tissue of left lower limb, including hip: Secondary | ICD-10-CM | POA: Diagnosis not present

## 2023-03-24 DIAGNOSIS — Z510 Encounter for antineoplastic radiation therapy: Secondary | ICD-10-CM | POA: Diagnosis not present

## 2023-03-24 LAB — RAD ONC ARIA SESSION SUMMARY
Course Elapsed Days: 19
Plan Fractions Treated to Date: 12
Plan Prescribed Dose Per Fraction: 1.8 Gy
Plan Total Fractions Prescribed: 38
Plan Total Prescribed Dose: 68.4 Gy
Reference Point Dosage Given to Date: 21.6 Gy
Reference Point Session Dosage Given: 1.8 Gy
Session Number: 12

## 2023-03-25 ENCOUNTER — Ambulatory Visit
Admission: RE | Admit: 2023-03-25 | Discharge: 2023-03-25 | Disposition: A | Discharge status: Home / Self Care | Payer: Medicare Other | Source: Ambulatory Visit | Attending: Radiation Oncology | Admitting: Radiation Oncology

## 2023-03-25 ENCOUNTER — Ambulatory Visit: Payer: Medicare Other

## 2023-03-25 ENCOUNTER — Other Ambulatory Visit: Payer: Self-pay

## 2023-03-25 DIAGNOSIS — Z510 Encounter for antineoplastic radiation therapy: Secondary | ICD-10-CM | POA: Diagnosis not present

## 2023-03-25 DIAGNOSIS — C4922 Malignant neoplasm of connective and soft tissue of left lower limb, including hip: Secondary | ICD-10-CM | POA: Diagnosis not present

## 2023-03-25 LAB — RAD ONC ARIA SESSION SUMMARY
Course Elapsed Days: 20
Plan Fractions Treated to Date: 13
Plan Prescribed Dose Per Fraction: 1.8 Gy
Plan Total Fractions Prescribed: 38
Plan Total Prescribed Dose: 68.4 Gy
Reference Point Dosage Given to Date: 23.4 Gy
Reference Point Session Dosage Given: 1.8 Gy
Session Number: 13

## 2023-03-26 ENCOUNTER — Other Ambulatory Visit: Payer: Self-pay

## 2023-03-26 ENCOUNTER — Ambulatory Visit
Admission: RE | Admit: 2023-03-26 | Discharge: 2023-03-26 | Disposition: A | Discharge status: Home / Self Care | Payer: Medicare Other | Source: Ambulatory Visit | Attending: Radiation Oncology | Admitting: Radiation Oncology

## 2023-03-26 ENCOUNTER — Ambulatory Visit: Payer: Medicare Other | Admitting: Family Medicine

## 2023-03-26 VITALS — BP 152/74 | HR 84 | Ht 68.0 in | Wt 192.0 lb

## 2023-03-26 DIAGNOSIS — M25511 Pain in right shoulder: Secondary | ICD-10-CM | POA: Diagnosis not present

## 2023-03-26 DIAGNOSIS — M25512 Pain in left shoulder: Secondary | ICD-10-CM

## 2023-03-26 DIAGNOSIS — Z510 Encounter for antineoplastic radiation therapy: Secondary | ICD-10-CM | POA: Diagnosis not present

## 2023-03-26 DIAGNOSIS — C4922 Malignant neoplasm of connective and soft tissue of left lower limb, including hip: Secondary | ICD-10-CM | POA: Diagnosis not present

## 2023-03-26 DIAGNOSIS — G8929 Other chronic pain: Secondary | ICD-10-CM | POA: Diagnosis not present

## 2023-03-26 LAB — RAD ONC ARIA SESSION SUMMARY
Course Elapsed Days: 21
Plan Fractions Treated to Date: 14
Plan Prescribed Dose Per Fraction: 1.8 Gy
Plan Total Fractions Prescribed: 38
Plan Total Prescribed Dose: 68.4 Gy
Reference Point Dosage Given to Date: 25.2 Gy
Reference Point Session Dosage Given: 1.8 Gy
Session Number: 14

## 2023-03-26 NOTE — Patient Instructions (Addendum)
Thank you for coming in today.   You received an injection today. Seek immediate medical attention if the joint becomes red, extremely painful, or is oozing fluid.   Check back as needed.  If not better I can inject a different part of your shoulder next week.

## 2023-03-26 NOTE — Progress Notes (Signed)
Rubin Payor, PhD, LAT, ATC acting as a scribe for [name], MD.  [name] is a 82 y.o. female who presents to [facility] at [facility] today for R shoulder pain. Pt was previously seen by Dr. Denyse Amass on 12/19/22 for a mass in her L popliteal fossa, which ultimately resulted in an excision of a myxofibrosarcoma. She is currently receiving radiation treatments.  Today, pt c/o bilateral shoulder right worse than left shoulder pain that's chronic in nature. She has to lay on a narrow table for her radiation treatments and has to put her arms overhead. Pt locates pain to the superior aspect of her R shoulder. RHD. She notes that both shoulders are hurting, but R>L,  Aggravates: overhead Treatments tried: tylenol, Voltaren gel  Pertinent review of systems: No fevers or chills  Relevant historical information: Right knee posterior mass found to be myxofibrosarcoma status post excision   Exam:  BP (!) 152/74   Pulse 84   Ht 5\' 8"  (1.727 m)   Wt 192 lb (87.1 kg)   SpO2 96%   BMI 29.19 kg/m  General: Well Developed, well nourished, and in no acute distress.   MSK: Right shoulder normal-appearing Normal motion pain with abduction.  Crepitation is present with motion.  Reduced strength to abduction  Left shoulder: Normal appearing. Normal motion.  Pain with abduction.  Reduced strength abduction.    Lab and Radiology Results  Procedure: Real-time Ultrasound Guided Injection of right shoulder glenohumeral joint Device: Philips Affiniti 50G/GE Logiq Images permanently stored and available for review in PACS Verbal informed consent obtained.  Discussed risks and benefits of procedure. Warned about infection, bleeding, hyperglycemia damage to structures among others. Patient expresses understanding and agreement Time-out conducted.   Noted no overlying erythema, induration, or other signs of local infection.   Skin prepped in a sterile fashion.   Local  anesthesia: Topical Ethyl chloride.   With sterile technique and under real time ultrasound guidance: 40 mg of Kenalog and 2 mL of Marcaine injected into subacromial bursa. Fluid seen entering the bursa.   Completed without difficulty   Pain immediately resolved suggesting accurate placement of the medication.   Advised to call if fevers/chills, erythema, induration, drainage, or persistent bleeding.   Images permanently stored and available for review in the ultrasound unit.  Impression: Technically successful ultrasound guided injection.    Procedure: Real-time Ultrasound Guided Injection of left shoulder subacromial bursa Device: Philips Affiniti 50G/GE Logiq Images permanently stored and available for review in PACS Verbal informed consent obtained.  Discussed risks and benefits of procedure. Warned about infection, bleeding, hyperglycemia damage to structures among others. Patient expresses understanding and agreement Time-out conducted.   Noted no overlying erythema, induration, or other signs of local infection.   Skin prepped in a sterile fashion.   Local anesthesia: Topical Ethyl chloride.   With sterile technique and under real time ultrasound guidance: 40 mg of Kenalog and 2 ml of Marcaine injected into subacromial bursa. Fluid seen entering the bursa.   Completed without difficulty   Pain immediately resolved suggesting accurate placement of the medication.   Advised to call if fevers/chills, erythema, induration, drainage, or persistent bleeding.   Images permanently stored and available for review in the ultrasound unit.  Impression: Technically successful ultrasound guided injection.        Assessment and Plan: 82 y.o. female with chronic shoulder pain bilaterally right worse than left.  Pain thought to be due to degenerative changes.  The other part of the pain thought to be due to arm positioning required by radiation treatment.  Plan for bilateral shoulder injection  today.  Check back as needed.   PDMP not reviewed this encounter. Orders Placed This Encounter  Procedures   [place] LIMITED JOINT SPACE STRUCTURES UP BILAT(NO LINKED CHARGES)    Order Specific Question:   Reason for Exam (SYMPTOM  OR DIAGNOSIS REQUIRED)    Answer:   bilateral shoulder pain    Order Specific Question:   Preferred imaging location?    Answer:   [facility]   No orders of the defined types were placed in this encounter.    Discussed warning signs or symptoms. Please see discharge instructions. Patient expresses understanding.   The above documentation has been reviewed and is accurate and complete [name], M.D.

## 2023-03-27 ENCOUNTER — Ambulatory Visit
Admission: RE | Admit: 2023-03-27 | Discharge: 2023-03-27 | Disposition: A | Discharge status: Home / Self Care | Payer: Medicare Other | Source: Ambulatory Visit | Attending: Radiation Oncology | Admitting: Radiation Oncology

## 2023-03-27 ENCOUNTER — Other Ambulatory Visit: Payer: Self-pay

## 2023-03-27 DIAGNOSIS — C4922 Malignant neoplasm of connective and soft tissue of left lower limb, including hip: Secondary | ICD-10-CM | POA: Diagnosis not present

## 2023-03-27 DIAGNOSIS — Z510 Encounter for antineoplastic radiation therapy: Secondary | ICD-10-CM | POA: Diagnosis not present

## 2023-03-27 LAB — RAD ONC ARIA SESSION SUMMARY
Course Elapsed Days: 22
Plan Fractions Treated to Date: 15
Plan Prescribed Dose Per Fraction: 1.8 Gy
Plan Total Fractions Prescribed: 38
Plan Total Prescribed Dose: 68.4 Gy
Reference Point Dosage Given to Date: 27 Gy
Reference Point Session Dosage Given: 1.8 Gy
Session Number: 15

## 2023-03-28 ENCOUNTER — Other Ambulatory Visit: Payer: Self-pay

## 2023-03-28 ENCOUNTER — Ambulatory Visit
Admission: RE | Admit: 2023-03-28 | Discharge: 2023-03-28 | Disposition: A | Discharge status: Home / Self Care | Payer: Medicare Other | Source: Ambulatory Visit | Attending: Radiation Oncology | Admitting: Radiation Oncology

## 2023-03-28 DIAGNOSIS — C4922 Malignant neoplasm of connective and soft tissue of left lower limb, including hip: Secondary | ICD-10-CM | POA: Diagnosis not present

## 2023-03-28 DIAGNOSIS — Z510 Encounter for antineoplastic radiation therapy: Secondary | ICD-10-CM | POA: Diagnosis not present

## 2023-03-28 LAB — RAD ONC ARIA SESSION SUMMARY
Course Elapsed Days: 23
Plan Fractions Treated to Date: 16
Plan Prescribed Dose Per Fraction: 1.8 Gy
Plan Total Fractions Prescribed: 38
Plan Total Prescribed Dose: 68.4 Gy
Reference Point Dosage Given to Date: 28.8 Gy
Reference Point Session Dosage Given: 1.8 Gy
Session Number: 16

## 2023-03-31 ENCOUNTER — Other Ambulatory Visit: Payer: Self-pay

## 2023-03-31 ENCOUNTER — Ambulatory Visit
Admission: RE | Admit: 2023-03-31 | Discharge: 2023-03-31 | Disposition: A | Discharge status: Home / Self Care | Payer: Medicare Other | Source: Ambulatory Visit | Attending: Radiation Oncology | Admitting: Radiation Oncology

## 2023-03-31 DIAGNOSIS — M79675 Pain in left toe(s): Secondary | ICD-10-CM | POA: Diagnosis not present

## 2023-03-31 DIAGNOSIS — B351 Tinea unguium: Secondary | ICD-10-CM | POA: Diagnosis not present

## 2023-03-31 DIAGNOSIS — M2041 Other hammer toe(s) (acquired), right foot: Secondary | ICD-10-CM | POA: Diagnosis not present

## 2023-03-31 DIAGNOSIS — M2042 Other hammer toe(s) (acquired), left foot: Secondary | ICD-10-CM | POA: Diagnosis not present

## 2023-03-31 DIAGNOSIS — E1142 Type 2 diabetes mellitus with diabetic polyneuropathy: Secondary | ICD-10-CM | POA: Diagnosis not present

## 2023-03-31 DIAGNOSIS — M79674 Pain in right toe(s): Secondary | ICD-10-CM | POA: Diagnosis not present

## 2023-03-31 DIAGNOSIS — C4922 Malignant neoplasm of connective and soft tissue of left lower limb, including hip: Secondary | ICD-10-CM | POA: Diagnosis not present

## 2023-03-31 DIAGNOSIS — Z510 Encounter for antineoplastic radiation therapy: Secondary | ICD-10-CM | POA: Diagnosis not present

## 2023-03-31 LAB — RAD ONC ARIA SESSION SUMMARY
Course Elapsed Days: 26
Plan Fractions Treated to Date: 17
Plan Prescribed Dose Per Fraction: 1.8 Gy
Plan Total Fractions Prescribed: 38
Plan Total Prescribed Dose: 68.4 Gy
Reference Point Dosage Given to Date: 30.6 Gy
Reference Point Session Dosage Given: 1.8 Gy
Session Number: 17

## 2023-04-01 ENCOUNTER — Ambulatory Visit
Admission: RE | Admit: 2023-04-01 | Discharge: 2023-04-01 | Disposition: A | Discharge status: Home / Self Care | Payer: Medicare Other | Source: Ambulatory Visit | Attending: Radiation Oncology | Admitting: Radiation Oncology

## 2023-04-01 ENCOUNTER — Other Ambulatory Visit: Payer: Self-pay

## 2023-04-01 DIAGNOSIS — C4922 Malignant neoplasm of connective and soft tissue of left lower limb, including hip: Secondary | ICD-10-CM | POA: Diagnosis not present

## 2023-04-01 DIAGNOSIS — Z510 Encounter for antineoplastic radiation therapy: Secondary | ICD-10-CM | POA: Diagnosis not present

## 2023-04-01 LAB — RAD ONC ARIA SESSION SUMMARY
Course Elapsed Days: 27
Plan Fractions Treated to Date: 18
Plan Prescribed Dose Per Fraction: 1.8 Gy
Plan Total Fractions Prescribed: 38
Plan Total Prescribed Dose: 68.4 Gy
Reference Point Dosage Given to Date: 32.4 Gy
Reference Point Session Dosage Given: 1.8 Gy
Session Number: 18

## 2023-04-02 ENCOUNTER — Encounter (INDEPENDENT_AMBULATORY_CARE_PROVIDER_SITE_OTHER): Payer: Self-pay

## 2023-04-02 ENCOUNTER — Ambulatory Visit
Admission: RE | Admit: 2023-04-02 | Discharge: 2023-04-02 | Disposition: A | Discharge status: Home / Self Care | Payer: Medicare Other | Source: Ambulatory Visit | Attending: Radiation Oncology | Admitting: Radiation Oncology

## 2023-04-02 ENCOUNTER — Other Ambulatory Visit: Payer: Self-pay

## 2023-04-02 DIAGNOSIS — Z510 Encounter for antineoplastic radiation therapy: Secondary | ICD-10-CM | POA: Diagnosis not present

## 2023-04-02 DIAGNOSIS — C4922 Malignant neoplasm of connective and soft tissue of left lower limb, including hip: Secondary | ICD-10-CM | POA: Diagnosis not present

## 2023-04-02 LAB — RAD ONC ARIA SESSION SUMMARY
Course Elapsed Days: 28
Plan Fractions Treated to Date: 19
Plan Prescribed Dose Per Fraction: 1.8 Gy
Plan Total Fractions Prescribed: 38
Plan Total Prescribed Dose: 68.4 Gy
Reference Point Dosage Given to Date: 34.2 Gy
Reference Point Session Dosage Given: 1.8 Gy
Session Number: 19

## 2023-04-03 ENCOUNTER — Other Ambulatory Visit: Payer: Self-pay

## 2023-04-03 ENCOUNTER — Ambulatory Visit
Admission: RE | Admit: 2023-04-03 | Discharge: 2023-04-03 | Disposition: A | Discharge status: Home / Self Care | Payer: Medicare Other | Source: Ambulatory Visit | Attending: Radiation Oncology | Admitting: Radiation Oncology

## 2023-04-03 DIAGNOSIS — C4922 Malignant neoplasm of connective and soft tissue of left lower limb, including hip: Secondary | ICD-10-CM | POA: Insufficient documentation

## 2023-04-03 DIAGNOSIS — Z510 Encounter for antineoplastic radiation therapy: Secondary | ICD-10-CM | POA: Insufficient documentation

## 2023-04-03 LAB — RAD ONC ARIA SESSION SUMMARY
Course Elapsed Days: 29
Plan Fractions Treated to Date: 20
Plan Prescribed Dose Per Fraction: 1.8 Gy
Plan Total Fractions Prescribed: 38
Plan Total Prescribed Dose: 68.4 Gy
Reference Point Dosage Given to Date: 36 Gy
Reference Point Session Dosage Given: 1.8 Gy
Session Number: 20

## 2023-04-04 ENCOUNTER — Other Ambulatory Visit: Payer: Self-pay

## 2023-04-04 ENCOUNTER — Ambulatory Visit
Admission: RE | Admit: 2023-04-04 | Discharge: 2023-04-04 | Disposition: A | Discharge status: Home / Self Care | Payer: Medicare Other | Source: Ambulatory Visit | Attending: Radiation Oncology | Admitting: Radiation Oncology

## 2023-04-04 DIAGNOSIS — Z510 Encounter for antineoplastic radiation therapy: Secondary | ICD-10-CM | POA: Diagnosis not present

## 2023-04-04 DIAGNOSIS — C4922 Malignant neoplasm of connective and soft tissue of left lower limb, including hip: Secondary | ICD-10-CM | POA: Diagnosis not present

## 2023-04-04 LAB — RAD ONC ARIA SESSION SUMMARY
Course Elapsed Days: 30
Plan Fractions Treated to Date: 21
Plan Prescribed Dose Per Fraction: 1.8 Gy
Plan Total Fractions Prescribed: 38
Plan Total Prescribed Dose: 68.4 Gy
Reference Point Dosage Given to Date: 37.8 Gy
Reference Point Session Dosage Given: 1.8 Gy
Session Number: 21

## 2023-04-07 ENCOUNTER — Other Ambulatory Visit: Payer: Self-pay

## 2023-04-07 ENCOUNTER — Ambulatory Visit
Admission: RE | Admit: 2023-04-07 | Discharge: 2023-04-07 | Disposition: A | Discharge status: Home / Self Care | Payer: Medicare Other | Source: Ambulatory Visit | Attending: Radiation Oncology | Admitting: Radiation Oncology

## 2023-04-07 DIAGNOSIS — C4922 Malignant neoplasm of connective and soft tissue of left lower limb, including hip: Secondary | ICD-10-CM | POA: Diagnosis not present

## 2023-04-07 DIAGNOSIS — Z510 Encounter for antineoplastic radiation therapy: Secondary | ICD-10-CM | POA: Diagnosis not present

## 2023-04-07 LAB — RAD ONC ARIA SESSION SUMMARY
Course Elapsed Days: 33
Plan Fractions Treated to Date: 22
Plan Prescribed Dose Per Fraction: 1.8 Gy
Plan Total Fractions Prescribed: 38
Plan Total Prescribed Dose: 68.4 Gy
Reference Point Dosage Given to Date: 39.6 Gy
Reference Point Session Dosage Given: 1.8 Gy
Session Number: 22

## 2023-04-08 ENCOUNTER — Other Ambulatory Visit: Payer: Self-pay

## 2023-04-08 ENCOUNTER — Ambulatory Visit
Admission: RE | Admit: 2023-04-08 | Discharge: 2023-04-08 | Disposition: A | Discharge status: Home / Self Care | Payer: Medicare Other | Source: Ambulatory Visit | Attending: Radiation Oncology | Admitting: Radiation Oncology

## 2023-04-08 DIAGNOSIS — Z510 Encounter for antineoplastic radiation therapy: Secondary | ICD-10-CM | POA: Diagnosis not present

## 2023-04-08 DIAGNOSIS — C4922 Malignant neoplasm of connective and soft tissue of left lower limb, including hip: Secondary | ICD-10-CM | POA: Diagnosis not present

## 2023-04-08 DIAGNOSIS — C499 Malignant neoplasm of connective and soft tissue, unspecified: Secondary | ICD-10-CM

## 2023-04-08 LAB — RAD ONC ARIA SESSION SUMMARY
Course Elapsed Days: 34
Plan Fractions Treated to Date: 23
Plan Prescribed Dose Per Fraction: 1.8 Gy
Plan Total Fractions Prescribed: 38
Plan Total Prescribed Dose: 68.4 Gy
Reference Point Dosage Given to Date: 41.4 Gy
Reference Point Session Dosage Given: 1.8 Gy
Session Number: 23

## 2023-04-09 ENCOUNTER — Encounter: Payer: Self-pay | Admitting: Hematology & Oncology

## 2023-04-09 ENCOUNTER — Encounter: Payer: Self-pay | Admitting: *Deleted

## 2023-04-09 ENCOUNTER — Inpatient Hospital Stay: Payer: Medicare Other | Admitting: Hematology & Oncology

## 2023-04-09 ENCOUNTER — Inpatient Hospital Stay: Payer: Medicare Other

## 2023-04-09 ENCOUNTER — Other Ambulatory Visit: Payer: Self-pay

## 2023-04-09 ENCOUNTER — Ambulatory Visit
Admission: RE | Admit: 2023-04-09 | Discharge: 2023-04-09 | Disposition: A | Discharge status: Home / Self Care | Payer: Medicare Other | Source: Ambulatory Visit | Attending: Radiation Oncology | Admitting: Radiation Oncology

## 2023-04-09 ENCOUNTER — Ambulatory Visit (HOSPITAL_BASED_OUTPATIENT_CLINIC_OR_DEPARTMENT_OTHER)
Admission: RE | Admit: 2023-04-09 | Discharge: 2023-04-09 | Disposition: A | Discharge status: Home / Self Care | Payer: Medicare Other | Source: Ambulatory Visit | Attending: Hematology & Oncology | Admitting: Hematology & Oncology

## 2023-04-09 VITALS — BP 158/56 | HR 92 | Temp 98.7°F | Resp 18 | Ht 68.0 in | Wt 195.1 lb

## 2023-04-09 DIAGNOSIS — M2550 Pain in unspecified joint: Secondary | ICD-10-CM | POA: Insufficient documentation

## 2023-04-09 DIAGNOSIS — R0781 Pleurodynia: Secondary | ICD-10-CM | POA: Diagnosis not present

## 2023-04-09 DIAGNOSIS — C499 Malignant neoplasm of connective and soft tissue, unspecified: Secondary | ICD-10-CM | POA: Insufficient documentation

## 2023-04-09 DIAGNOSIS — C4922 Malignant neoplasm of connective and soft tissue of left lower limb, including hip: Secondary | ICD-10-CM | POA: Diagnosis not present

## 2023-04-09 DIAGNOSIS — Z510 Encounter for antineoplastic radiation therapy: Secondary | ICD-10-CM | POA: Diagnosis not present

## 2023-04-09 LAB — RAD ONC ARIA SESSION SUMMARY
Course Elapsed Days: 35
Plan Fractions Treated to Date: 24
Plan Prescribed Dose Per Fraction: 1.8 Gy
Plan Total Fractions Prescribed: 38
Plan Total Prescribed Dose: 68.4 Gy
Reference Point Dosage Given to Date: 43.2 Gy
Reference Point Session Dosage Given: 1.8 Gy
Session Number: 24

## 2023-04-09 LAB — CMP (CANCER CENTER ONLY)
ALT: 8 U/L (ref 0–44)
AST: 9 U/L — ABNORMAL LOW (ref 15–41)
Albumin: 4 g/dL (ref 3.5–5.0)
Alkaline Phosphatase: 63 U/L (ref 38–126)
Anion gap: 7 (ref 5–15)
BUN: 26 mg/dL — ABNORMAL HIGH (ref 8–23)
CO2: 32 mmol/L (ref 22–32)
Calcium: 9.8 mg/dL (ref 8.9–10.3)
Chloride: 100 mmol/L (ref 98–111)
Creatinine: 0.89 mg/dL (ref 0.44–1.00)
GFR, Estimated: >60 mL/min (ref >60)
Glucose, Bld: 117 mg/dL — ABNORMAL HIGH (ref 70–99)
Potassium: 4.8 mmol/L (ref 3.5–5.1)
Sodium: 139 mmol/L (ref 135–145)
Total Bilirubin: 0.5 mg/dL (ref 0.3–1.2)
Total Protein: 7 g/dL (ref 6.5–8.1)

## 2023-04-09 LAB — CBC WITH DIFFERENTIAL (CANCER CENTER ONLY)
Abs Immature Granulocytes: 0.02 10*3/uL (ref 0.00–0.07)
Basophils Absolute: 0 10*3/uL (ref 0.0–0.1)
Basophils Relative: 1 %
Eosinophils Absolute: 0.1 10*3/uL (ref 0.0–0.5)
Eosinophils Relative: 1 %
HCT: 38 % (ref 36.0–46.0)
Hemoglobin: 12.2 g/dL (ref 12.0–15.0)
Immature Granulocytes: 0 %
Lymphocytes Relative: 15 %
Lymphs Abs: 1.3 10*3/uL (ref 0.7–4.0)
MCH: 29.4 pg (ref 26.0–34.0)
MCHC: 32.1 g/dL (ref 30.0–36.0)
MCV: 91.6 fL (ref 80.0–100.0)
Monocytes Absolute: 0.8 10*3/uL (ref 0.1–1.0)
Monocytes Relative: 9 %
Neutro Abs: 6.5 10*3/uL (ref 1.7–7.7)
Neutrophils Relative %: 74 %
Platelet Count: 244 10*3/uL (ref 150–400)
RBC: 4.15 MIL/uL (ref 3.87–5.11)
RDW: 14.5 % (ref 11.5–15.5)
WBC Count: 8.7 10*3/uL (ref 4.0–10.5)
nRBC: 0 % (ref 0.0–0.2)

## 2023-04-09 LAB — LACTATE DEHYDROGENASE: LDH: 157 U/L (ref 98–192)

## 2023-04-09 NOTE — Progress Notes (Unsigned)
Patient is currently receiving radiation therapy. She will finish 04/28/2023.   Oncology Nurse Navigator Documentation     04/09/2023   12:30 PM  Oncology Nurse Navigator Flowsheets  Radiation Actual Start Date: 03/05/2023  Navigator Follow Up Date: 05/08/2023  Navigator Follow Up Reason: Follow-up Appointment  Navigator Location [facility]  Navigator Encounter Type Appt/Treatment Plan Review  Treatment Initiated Date 03/05/2023  Patient Visit Type MedOnc  Treatment Phase Active Tx  Barriers/Navigation Needs No Barriers At This Time  Interventions None Required  Acuity Level 1-No Barriers  Support Groups/Services Friends and Family  Time Spent with Patient 15

## 2023-04-09 NOTE — Progress Notes (Signed)
Hematology and Oncology Follow Up Visit  [name] [id number] 10-04-1940 82 y.o. 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  This is her second office visit.  She is getting radiation therapy behind the left knee.  She has had 21 treatments.  She has had little bit of pain back there.  She has had little bit her radiation dermatitis.  As always, Radiation Oncology are on top of all of this.  They told her how to help manage this.  Of note, we did a CT of her chest back when she was diagnosed.  There is nothing that looks like metastatic disease..  She is having some pain in the left lateral rib cage.  This is in the mid rib cage.  We will see about a chest x-ray.  She is very worried about this.  She feels that her cancer will be coming back.  She has had no problems with bowels or bladder.  She has had no problems with cough or shortness of breath.  She has had no bleeding.  There has been no bruising.  She has had maybe a little bit of leg swelling.  Overall, I would say performance status is ECOG 1.  Medications:  Current Outpatient Medications:    alendronate (FOSAMAX) 70 MG tablet, TAKE 1 TABLET (70 MG TOTAL) BY MOUTH EVERY 7 DAYS WITH FULL GLASS WATER ON EMPTY STOMACH, Disp: 12 tablet, Rfl: 3   allopurinol (ZYLOPRIM) 100 MG tablet, Take 2 tablets (200 mg total) by mouth daily., Disp: 180 tablet, Rfl: 3   amiodarone (PACERONE) 200 MG tablet, TAKE 400 MG (2 TABLETS) ONE DOSE AS NEED FOR A FAST HEART RATE ,IF AFTER 12 HOURS STILL PRESENT TAKE 200 MG ( 1 TABLET) ONE DOSE FOR EACH EPISODE, Disp: 180 tablet, Rfl: 1   apixaban (ELIQUIS) 5 MG TABS tablet, Take 1 tablet (5 mg total) by mouth 2 (two) times daily., Disp: 180 tablet, Rfl: 3   Artificial Tear Solution (TEARS RENEWED OP), Apply 1 drop to  eye daily as needed (dry eyes)., Disp: , Rfl:    carvedilol (COREG) 6.25 MG tablet, TAKE 1 TABLET BY MOUTH TWICE DAILY WITH A MEAL. MAY TAKE AN EXTRA TABLET DAILY IF HAVING PALPATIONS (Patient taking differently: Take 6.25 mg by mouth 2 (two) times daily with a meal.), Disp: 225 tablet, Rfl: 3   colchicine 0.6 MG tablet, Take by mouth., Disp: , Rfl:    cyanocobalamin (VITAMIN B12) 1000 MCG/ML injection, 1000 MCG INJECTION ONCE PER MONTH. PLEASE PROVIDE SYRINGES AS WELL, Disp: 3 mL, Rfl: 5   diclofenac sodium (VOLTAREN) 1 % GEL, Apply 2 g topically 4 (four) times daily as needed (pain)., Disp: , Rfl:    furosemide (LASIX) 80 MG tablet, Take 80 mg by mouth 2 (two) times daily., Disp: , Rfl:    hyoscyamine (ANASPAZ) 0.125 MG TBDP disintergrating tablet, Place 1 tablet (0.125 mg total) under the tongue every 6 (six) hours as needed (abdominal cramping)., Disp: 30 tablet, Rfl: 5   indapamide (LOZOL) 2.5 MG tablet, TAKE ONE TABLET BY MOUTH DAILY, 30 MINUTES BEFORE DAILY FUROSEMIDE., Disp: 90 tablet, Rfl: 2   isosorbide mononitrate (IMDUR) 30 MG 24 hr tablet, Take 1 tablet (30 mg total) by mouth daily. May take an extra dose if you use Nitroglycerin  sublingual  tablet for 2 days afterwards, Disp: 120 tablet, Rfl: 3   lidocaine (XYLOCAINE) 5 % ointment, Apply 1 application. topically as needed., Disp: 35.44 g, Rfl: 0   losartan (COZAAR) 50 MG tablet, TAKE 1 TABLET BY MOUTH EVERY DAY, Disp: 90 tablet, Rfl: 3   meclizine (ANTIVERT) 12.5 MG tablet, TAKE 1 TABLET BY MOUTH THREE TIMES A DAY AS NEEDED FOR DIZZINESS, Disp: 30 tablet, Rfl: 1   spironolactone (ALDACTONE) 25 MG tablet, TAKE 1 TABLET BY MOUTH AS DIRECTED. TAKE 1 TABLET ON MONDAYS, WEDNESDAYS, AND FRIDAYS AT 6 PM., Disp: 45 tablet, Rfl: 3   nitroGLYCERIN (NITROSTAT) 0.4 MG SL tablet, DISSOLVE 1 TABLET UNDER THE TONGUE EVERY 5 MINUTES AS NEEDED FOR CHEST PAIN (Patient not taking: Reported on 04/09/2023), Disp: 25 tablet, Rfl: 6  Allergies:  Allergies   Allergen Reactions   Codeine Phosphate Anaphylaxis, Shortness Of Breath and Swelling   Contrast Media [Iodinated Contrast Media] Swelling    Swelling of the face. Reaction was to ionic contrast many years ago. Patient has had non-ionic contrast many time without pre medication and has had no reaction.    Statins Other (See Comments)    Hyperactivity   Tramadol Palpitations and Other (See Comments)    Hyperactivity    Past Medical History, Surgical history, Social history, and Family History were reviewed and updated.  Review of Systems: Review of Systems  Constitutional: Negative.   HENT:  Negative.    Eyes: Negative.   Respiratory: Negative.    Cardiovascular: Negative.   Gastrointestinal: Negative.   Endocrine: Negative.   Genitourinary: Negative.    Musculoskeletal:  Positive for arthralgias.  Neurological: Negative.   Hematological: Negative.   Psychiatric/Behavioral: Negative.      Physical Exam:  height is 5\' 8"  (1.727 m) and weight is 195 lb 1.9 oz (88.5 kg). Her oral temperature is 98.7 F (37.1 C). Her blood pressure is 158/56 (abnormal) and her pulse is 92. Her respiration is 18 and oxygen saturation is 98%.   Wt Readings from Last 3 Encounters:  04/09/23 195 lb 1.9 oz (88.5 kg)  03/26/23 192 lb (87.1 kg)  03/10/23 198 lb (89.8 kg)    Physical Exam Vitals reviewed.  HENT:     Head: Normocephalic and atraumatic.  Eyes:     Pupils: Pupils are equal, round, and reactive to light.  Cardiovascular:     Rate and Rhythm: Normal rate and regular rhythm.     Heart sounds: Normal heart sounds.  Pulmonary:     Effort: Pulmonary effort is normal.     Breath sounds: Normal breath sounds.  Abdominal:     General: Bowel sounds are normal.     Palpations: Abdomen is soft.  Musculoskeletal:        General: No tenderness or deformity. Normal range of motion.     Cervical back: Normal range of motion.     Comments: Behind the left knee is a surgical scar.  This is  well-healed.  She has some radiation changes on the skin in the area of the radiation portal.  She may have a little bit decreased range of motion of the knee itself.  There may be a little bit of left leg swelling.  She has good pulses in the distal extremities.  She has a little bit of tenderness in the mid left lateral rib cage.  I cannot feel anything that is swollen.  Lymphadenopathy:     Cervical: No cervical adenopathy.  Skin:    General:  Skin is warm and dry.     Findings: No erythema or rash.  Neurological:     Mental Status: She is alert and oriented to person, place, and time.  Psychiatric:        Behavior: Behavior normal.        Thought Content: Thought content normal.        Judgment: Judgment normal.      Lab Results  Component Value Date   WBC 8.7 04/09/2023   HGB 12.2 04/09/2023   HCT 38.0 04/09/2023   MCV 91.6 04/09/2023   PLT 244 04/09/2023     Chemistry      Component Value Date/Time   NA 139 04/09/2023 1148   NA 136 04/29/2022 1230   K 4.8 04/09/2023 1148   CL 100 04/09/2023 1148   CO2 32 04/09/2023 1148   BUN 26 (H) 04/09/2023 1148   BUN 30 (H) 04/29/2022 1230   CREATININE 0.89 04/09/2023 1148   CREATININE 0.90 10/24/2016 1127      Component Value Date/Time   CALCIUM 9.8 04/09/2023 1148   ALKPHOS 63 04/09/2023 1148   AST 9 (L) 04/09/2023 1148   ALT 8 04/09/2023 1148   BILITOT 0.5 04/09/2023 1148       Impression and Plan: Ms. Borowiak is a very charming 82 year old white female.  She has a sarcoma behind the left knee in the popliteal fossa.  This is a fairly high-grade sarcoma.  As such, she had this resected.  Her margins were positive.  We have given her radiotherapy.  She still has 17 treatments to go.  Will see what the rib x-ray looks like.  I have to believe that she is at risk for recurrence.  We will plan for follow-up after she has radiation.  I told her that we do not do any scans for at least 2 months after her radiation.  Will  plan to get her back to see [place] in another 3 weeks or so.   Josph Macho, MD 8/7/20241:49 PM

## 2023-04-10 ENCOUNTER — Ambulatory Visit: Payer: Medicare Other

## 2023-04-10 ENCOUNTER — Encounter: Payer: Self-pay | Admitting: Hematology & Oncology

## 2023-04-10 NOTE — Progress Notes (Signed)
Patient approved for one-time $1000 [employer] grant to assist with personal expenses while going through treatment. She provided documentation and signed grant paperwork and has a copy.

## 2023-04-11 ENCOUNTER — Other Ambulatory Visit: Payer: Self-pay

## 2023-04-11 ENCOUNTER — Ambulatory Visit (INDEPENDENT_AMBULATORY_CARE_PROVIDER_SITE_OTHER): Payer: Medicare Other

## 2023-04-11 ENCOUNTER — Ambulatory Visit: Admission: RE | Admit: 2023-04-11 | Payer: Medicare Other | Source: Ambulatory Visit

## 2023-04-11 DIAGNOSIS — C4922 Malignant neoplasm of connective and soft tissue of left lower limb, including hip: Secondary | ICD-10-CM | POA: Diagnosis not present

## 2023-04-11 DIAGNOSIS — R55 Syncope and collapse: Secondary | ICD-10-CM

## 2023-04-11 DIAGNOSIS — Z510 Encounter for antineoplastic radiation therapy: Secondary | ICD-10-CM | POA: Diagnosis not present

## 2023-04-11 LAB — RAD ONC ARIA SESSION SUMMARY
Course Elapsed Days: 37
Plan Fractions Treated to Date: 25
Plan Prescribed Dose Per Fraction: 1.8 Gy
Plan Total Fractions Prescribed: 38
Plan Total Prescribed Dose: 68.4 Gy
Reference Point Dosage Given to Date: 45 Gy
Reference Point Session Dosage Given: 1.8 Gy
Session Number: 25

## 2023-04-14 ENCOUNTER — Other Ambulatory Visit: Payer: Self-pay

## 2023-04-14 ENCOUNTER — Ambulatory Visit
Admission: RE | Admit: 2023-04-14 | Discharge: 2023-04-14 | Disposition: A | Discharge status: Home / Self Care | Payer: Medicare Other | Source: Ambulatory Visit | Attending: Radiation Oncology | Admitting: Radiation Oncology

## 2023-04-14 DIAGNOSIS — C4922 Malignant neoplasm of connective and soft tissue of left lower limb, including hip: Secondary | ICD-10-CM | POA: Diagnosis not present

## 2023-04-14 DIAGNOSIS — Z510 Encounter for antineoplastic radiation therapy: Secondary | ICD-10-CM | POA: Diagnosis not present

## 2023-04-14 LAB — RAD ONC ARIA SESSION SUMMARY
Course Elapsed Days: 40
Plan Fractions Treated to Date: 26
Plan Prescribed Dose Per Fraction: 1.8 Gy
Plan Total Fractions Prescribed: 38
Plan Total Prescribed Dose: 68.4 Gy
Reference Point Dosage Given to Date: 46.8 Gy
Reference Point Session Dosage Given: 1.8 Gy
Session Number: 26

## 2023-04-14 NOTE — Progress Notes (Signed)
Enrolled [name] into the [employer]

## 2023-04-15 ENCOUNTER — Other Ambulatory Visit: Payer: Self-pay

## 2023-04-15 ENCOUNTER — Other Ambulatory Visit: Payer: Self-pay | Admitting: Radiation Oncology

## 2023-04-15 ENCOUNTER — Ambulatory Visit
Admission: RE | Admit: 2023-04-15 | Discharge: 2023-04-15 | Disposition: A | Discharge status: Home / Self Care | Payer: Medicare Other | Source: Ambulatory Visit | Attending: Radiation Oncology | Admitting: Radiation Oncology

## 2023-04-15 ENCOUNTER — Ambulatory Visit: Payer: Medicare Other

## 2023-04-15 DIAGNOSIS — C4922 Malignant neoplasm of connective and soft tissue of left lower limb, including hip: Secondary | ICD-10-CM | POA: Diagnosis not present

## 2023-04-15 DIAGNOSIS — Z510 Encounter for antineoplastic radiation therapy: Secondary | ICD-10-CM | POA: Diagnosis not present

## 2023-04-15 LAB — RAD ONC ARIA SESSION SUMMARY
Course Elapsed Days: 41
Plan Fractions Treated to Date: 27
Plan Prescribed Dose Per Fraction: 1.8 Gy
Plan Total Fractions Prescribed: 38
Plan Total Prescribed Dose: 68.4 Gy
Reference Point Dosage Given to Date: 48.6 Gy
Reference Point Session Dosage Given: 1.8 Gy
Session Number: 27

## 2023-04-15 MED ORDER — HYDROCODONE-ACETAMINOPHEN 5-325 MG PO TABS: 1.0000 | ORAL_TABLET | Freq: Four times a day (QID) | ORAL | 0 refills | Status: DC | PRN

## 2023-04-16 ENCOUNTER — Ambulatory Visit
Admission: RE | Admit: 2023-04-16 | Discharge: 2023-04-16 | Disposition: A | Discharge status: Home / Self Care | Payer: Medicare Other | Source: Ambulatory Visit | Attending: Radiation Oncology | Admitting: Radiation Oncology

## 2023-04-16 ENCOUNTER — Other Ambulatory Visit: Payer: Self-pay

## 2023-04-16 DIAGNOSIS — Z510 Encounter for antineoplastic radiation therapy: Secondary | ICD-10-CM | POA: Diagnosis not present

## 2023-04-16 DIAGNOSIS — C4922 Malignant neoplasm of connective and soft tissue of left lower limb, including hip: Secondary | ICD-10-CM | POA: Diagnosis not present

## 2023-04-16 LAB — RAD ONC ARIA SESSION SUMMARY
Course Elapsed Days: 42
Plan Fractions Treated to Date: 28
Plan Prescribed Dose Per Fraction: 1.8 Gy
Plan Total Fractions Prescribed: 38
Plan Total Prescribed Dose: 68.4 Gy
Reference Point Dosage Given to Date: 50.4 Gy
Reference Point Session Dosage Given: 1.8 Gy
Session Number: 28

## 2023-04-17 ENCOUNTER — Other Ambulatory Visit: Payer: Self-pay

## 2023-04-17 ENCOUNTER — Ambulatory Visit
Admission: RE | Admit: 2023-04-17 | Discharge: 2023-04-17 | Disposition: A | Discharge status: Home / Self Care | Payer: Medicare Other | Source: Ambulatory Visit | Attending: Radiation Oncology | Admitting: Radiation Oncology

## 2023-04-17 DIAGNOSIS — C4922 Malignant neoplasm of connective and soft tissue of left lower limb, including hip: Secondary | ICD-10-CM | POA: Diagnosis not present

## 2023-04-17 DIAGNOSIS — Z510 Encounter for antineoplastic radiation therapy: Secondary | ICD-10-CM | POA: Diagnosis not present

## 2023-04-17 LAB — RAD ONC ARIA SESSION SUMMARY
Course Elapsed Days: 43
Plan Fractions Treated to Date: 29
Plan Prescribed Dose Per Fraction: 1.8 Gy
Plan Total Fractions Prescribed: 38
Plan Total Prescribed Dose: 68.4 Gy
Reference Point Dosage Given to Date: 52.2 Gy
Reference Point Session Dosage Given: 1.8 Gy
Session Number: 29

## 2023-04-18 ENCOUNTER — Other Ambulatory Visit: Payer: Self-pay

## 2023-04-18 ENCOUNTER — Ambulatory Visit
Admission: RE | Admit: 2023-04-18 | Discharge: 2023-04-18 | Disposition: A | Discharge status: Home / Self Care | Payer: Medicare Other | Source: Ambulatory Visit | Attending: Radiation Oncology | Admitting: Radiation Oncology

## 2023-04-18 DIAGNOSIS — Z510 Encounter for antineoplastic radiation therapy: Secondary | ICD-10-CM | POA: Diagnosis not present

## 2023-04-18 DIAGNOSIS — C4922 Malignant neoplasm of connective and soft tissue of left lower limb, including hip: Secondary | ICD-10-CM | POA: Diagnosis not present

## 2023-04-18 LAB — RAD ONC ARIA SESSION SUMMARY
Course Elapsed Days: 44
Plan Fractions Treated to Date: 30
Plan Prescribed Dose Per Fraction: 1.8 Gy
Plan Total Fractions Prescribed: 38
Plan Total Prescribed Dose: 68.4 Gy
Reference Point Dosage Given to Date: 54 Gy
Reference Point Session Dosage Given: 1.8 Gy
Session Number: 30

## 2023-04-21 ENCOUNTER — Ambulatory Visit
Admission: RE | Admit: 2023-04-21 | Discharge: 2023-04-21 | Disposition: A | Discharge status: Home / Self Care | Payer: Medicare Other | Source: Ambulatory Visit | Attending: Radiation Oncology | Admitting: Radiation Oncology

## 2023-04-21 ENCOUNTER — Other Ambulatory Visit: Payer: Self-pay

## 2023-04-21 DIAGNOSIS — C4922 Malignant neoplasm of connective and soft tissue of left lower limb, including hip: Secondary | ICD-10-CM | POA: Diagnosis not present

## 2023-04-21 DIAGNOSIS — Z510 Encounter for antineoplastic radiation therapy: Secondary | ICD-10-CM | POA: Diagnosis not present

## 2023-04-21 LAB — RAD ONC ARIA SESSION SUMMARY
Course Elapsed Days: 47
Plan Fractions Treated to Date: 31
Plan Prescribed Dose Per Fraction: 1.8 Gy
Plan Total Fractions Prescribed: 38
Plan Total Prescribed Dose: 68.4 Gy
Reference Point Dosage Given to Date: 55.8 Gy
Reference Point Session Dosage Given: 1.8 Gy
Session Number: 31

## 2023-04-22 ENCOUNTER — Ambulatory Visit
Admission: RE | Admit: 2023-04-22 | Discharge: 2023-04-22 | Disposition: A | Discharge status: Home / Self Care | Payer: Medicare Other | Source: Ambulatory Visit | Attending: Radiation Oncology | Admitting: Radiation Oncology

## 2023-04-22 ENCOUNTER — Other Ambulatory Visit: Payer: Self-pay

## 2023-04-22 DIAGNOSIS — C4922 Malignant neoplasm of connective and soft tissue of left lower limb, including hip: Secondary | ICD-10-CM | POA: Diagnosis not present

## 2023-04-22 DIAGNOSIS — Z510 Encounter for antineoplastic radiation therapy: Secondary | ICD-10-CM | POA: Diagnosis not present

## 2023-04-22 LAB — RAD ONC ARIA SESSION SUMMARY
Course Elapsed Days: 48
Plan Fractions Treated to Date: 32
Plan Prescribed Dose Per Fraction: 1.8 Gy
Plan Total Fractions Prescribed: 38
Plan Total Prescribed Dose: 68.4 Gy
Reference Point Dosage Given to Date: 57.6 Gy
Reference Point Session Dosage Given: 1.8 Gy
Session Number: 32

## 2023-04-23 ENCOUNTER — Other Ambulatory Visit: Payer: Self-pay

## 2023-04-23 ENCOUNTER — Ambulatory Visit
Admission: RE | Admit: 2023-04-23 | Discharge: 2023-04-23 | Disposition: A | Discharge status: Home / Self Care | Payer: Medicare Other | Source: Ambulatory Visit | Attending: Radiation Oncology | Admitting: Radiation Oncology

## 2023-04-23 DIAGNOSIS — C4922 Malignant neoplasm of connective and soft tissue of left lower limb, including hip: Secondary | ICD-10-CM | POA: Diagnosis not present

## 2023-04-23 DIAGNOSIS — Z510 Encounter for antineoplastic radiation therapy: Secondary | ICD-10-CM | POA: Diagnosis not present

## 2023-04-23 LAB — RAD ONC ARIA SESSION SUMMARY
Course Elapsed Days: 49
Plan Fractions Treated to Date: 33
Plan Prescribed Dose Per Fraction: 1.8 Gy
Plan Total Fractions Prescribed: 38
Plan Total Prescribed Dose: 68.4 Gy
Reference Point Dosage Given to Date: 59.4 Gy
Reference Point Session Dosage Given: 1.8 Gy
Session Number: 33

## 2023-04-24 ENCOUNTER — Other Ambulatory Visit: Payer: Self-pay

## 2023-04-24 ENCOUNTER — Ambulatory Visit
Admission: RE | Admit: 2023-04-24 | Discharge: 2023-04-24 | Disposition: A | Discharge status: Home / Self Care | Payer: Medicare Other | Source: Ambulatory Visit | Attending: Radiation Oncology | Admitting: Radiation Oncology

## 2023-04-24 DIAGNOSIS — Z510 Encounter for antineoplastic radiation therapy: Secondary | ICD-10-CM | POA: Diagnosis not present

## 2023-04-24 DIAGNOSIS — C4922 Malignant neoplasm of connective and soft tissue of left lower limb, including hip: Secondary | ICD-10-CM | POA: Diagnosis not present

## 2023-04-24 LAB — RAD ONC ARIA SESSION SUMMARY
Course Elapsed Days: 50
Plan Fractions Treated to Date: 34
Plan Prescribed Dose Per Fraction: 1.8 Gy
Plan Total Fractions Prescribed: 38
Plan Total Prescribed Dose: 68.4 Gy
Reference Point Dosage Given to Date: 61.2 Gy
Reference Point Session Dosage Given: 1.8 Gy
Session Number: 34

## 2023-04-25 ENCOUNTER — Other Ambulatory Visit: Payer: Self-pay

## 2023-04-25 ENCOUNTER — Ambulatory Visit
Admission: RE | Admit: 2023-04-25 | Discharge: 2023-04-25 | Disposition: A | Discharge status: Home / Self Care | Payer: Medicare Other | Source: Ambulatory Visit | Attending: Radiation Oncology | Admitting: Radiation Oncology

## 2023-04-25 DIAGNOSIS — Z510 Encounter for antineoplastic radiation therapy: Secondary | ICD-10-CM | POA: Diagnosis not present

## 2023-04-25 DIAGNOSIS — C4922 Malignant neoplasm of connective and soft tissue of left lower limb, including hip: Secondary | ICD-10-CM | POA: Diagnosis not present

## 2023-04-25 LAB — RAD ONC ARIA SESSION SUMMARY
Course Elapsed Days: 51
Plan Fractions Treated to Date: 35
Plan Prescribed Dose Per Fraction: 1.8 Gy
Plan Total Fractions Prescribed: 38
Plan Total Prescribed Dose: 68.4 Gy
Reference Point Dosage Given to Date: 63 Gy
Reference Point Session Dosage Given: 1.8 Gy
Session Number: 35

## 2023-04-25 NOTE — Progress Notes (Signed)
Remote pacemaker transmission.   

## 2023-04-28 ENCOUNTER — Ambulatory Visit: Payer: Medicare Other

## 2023-04-28 ENCOUNTER — Ambulatory Visit: Admission: RE | Admit: 2023-04-28 | Payer: Medicare Other | Source: Ambulatory Visit

## 2023-04-28 ENCOUNTER — Other Ambulatory Visit: Payer: Self-pay

## 2023-04-28 DIAGNOSIS — Z510 Encounter for antineoplastic radiation therapy: Secondary | ICD-10-CM | POA: Diagnosis not present

## 2023-04-28 DIAGNOSIS — C4922 Malignant neoplasm of connective and soft tissue of left lower limb, including hip: Secondary | ICD-10-CM | POA: Diagnosis not present

## 2023-04-28 LAB — RAD ONC ARIA SESSION SUMMARY
Course Elapsed Days: 54
Plan Fractions Treated to Date: 36
Plan Prescribed Dose Per Fraction: 1.8 Gy
Plan Total Fractions Prescribed: 38
Plan Total Prescribed Dose: 68.4 Gy
Reference Point Dosage Given to Date: 64.8 Gy
Reference Point Session Dosage Given: 1.8 Gy
Session Number: 36

## 2023-04-29 ENCOUNTER — Ambulatory Visit: Admission: RE | Admit: 2023-04-29 | Payer: Medicare Other | Source: Ambulatory Visit

## 2023-04-29 ENCOUNTER — Ambulatory Visit: Payer: Medicare Other

## 2023-04-29 ENCOUNTER — Other Ambulatory Visit: Payer: Self-pay

## 2023-04-29 ENCOUNTER — Ambulatory Visit
Admission: RE | Admit: 2023-04-29 | Discharge: 2023-04-29 | Disposition: A | Discharge status: Home / Self Care | Payer: Medicare Other | Source: Ambulatory Visit | Attending: Radiation Oncology | Admitting: Radiation Oncology

## 2023-04-29 DIAGNOSIS — Z510 Encounter for antineoplastic radiation therapy: Secondary | ICD-10-CM | POA: Diagnosis not present

## 2023-04-29 DIAGNOSIS — C4922 Malignant neoplasm of connective and soft tissue of left lower limb, including hip: Secondary | ICD-10-CM | POA: Diagnosis not present

## 2023-04-29 LAB — RAD ONC ARIA SESSION SUMMARY
Course Elapsed Days: 55
Plan Fractions Treated to Date: 37
Plan Prescribed Dose Per Fraction: 1.8 Gy
Plan Total Fractions Prescribed: 38
Plan Total Prescribed Dose: 68.4 Gy
Reference Point Dosage Given to Date: 66.6 Gy
Reference Point Session Dosage Given: 1.8 Gy
Session Number: 37

## 2023-04-30 ENCOUNTER — Other Ambulatory Visit: Payer: Self-pay

## 2023-04-30 ENCOUNTER — Ambulatory Visit
Admission: RE | Admit: 2023-04-30 | Discharge: 2023-04-30 | Disposition: A | Discharge status: Home / Self Care | Payer: Medicare Other | Source: Ambulatory Visit | Attending: Radiation Oncology | Admitting: Radiation Oncology

## 2023-04-30 DIAGNOSIS — Z510 Encounter for antineoplastic radiation therapy: Secondary | ICD-10-CM | POA: Diagnosis not present

## 2023-04-30 DIAGNOSIS — C4922 Malignant neoplasm of connective and soft tissue of left lower limb, including hip: Secondary | ICD-10-CM | POA: Diagnosis not present

## 2023-04-30 LAB — RAD ONC ARIA SESSION SUMMARY
Course Elapsed Days: 56
Plan Fractions Treated to Date: 38
Plan Prescribed Dose Per Fraction: 1.8 Gy
Plan Total Fractions Prescribed: 38
Plan Total Prescribed Dose: 68.4 Gy
Reference Point Dosage Given to Date: 68.4 Gy
Reference Point Session Dosage Given: 1.8 Gy
Session Number: 38

## 2023-05-01 ENCOUNTER — Ambulatory Visit (INDEPENDENT_AMBULATORY_CARE_PROVIDER_SITE_OTHER): Payer: Medicare Other | Admitting: Family Medicine

## 2023-05-01 ENCOUNTER — Encounter: Payer: Self-pay | Admitting: Family Medicine

## 2023-05-01 VITALS — BP 122/70 | HR 82 | Temp 97.7°F | Ht 68.0 in | Wt 193.2 lb

## 2023-05-01 DIAGNOSIS — E1142 Type 2 diabetes mellitus with diabetic polyneuropathy: Secondary | ICD-10-CM

## 2023-05-01 DIAGNOSIS — I5030 Unspecified diastolic (congestive) heart failure: Secondary | ICD-10-CM

## 2023-05-01 DIAGNOSIS — I10 Essential (primary) hypertension: Secondary | ICD-10-CM

## 2023-05-01 DIAGNOSIS — Z9861 Coronary angioplasty status: Secondary | ICD-10-CM | POA: Diagnosis not present

## 2023-05-01 DIAGNOSIS — R350 Frequency of micturition: Secondary | ICD-10-CM

## 2023-05-01 DIAGNOSIS — I2510 Atherosclerotic heart disease of native coronary artery without angina pectoris: Secondary | ICD-10-CM | POA: Diagnosis not present

## 2023-05-01 LAB — POCT GLYCOSYLATED HEMOGLOBIN (HGB A1C): Hemoglobin A1C: 6.1 % — AB (ref 4.0–5.6)

## 2023-05-01 MED ORDER — HYDROCODONE-ACETAMINOPHEN 5-325 MG PO TABS: 1.0000 | ORAL_TABLET | Freq: Four times a day (QID) | ORAL | 0 refills | Status: DC | PRN

## 2023-05-01 NOTE — Patient Instructions (Addendum)
Have copy of Diabetic Eye Exam sent to [place] when you have it done at [phone].  Please stop by lab before you go- just for urine If you have mychart- we will send your results within 3 business days of [place] receiving them.  If you do not have mychart- we will call you about results within 5 business days of [place] receiving them.  *please also note that you will see labs on mychart as soon as they post. I will later go in and write notes on them- will say "notes from Dr. Durene Cal"   Recommended follow up: Return in about 14 weeks (around 08/07/2023) for followup or sooner if needed.Schedule b4 you leave.

## 2023-05-01 NOTE — Radiation Completion Notes (Signed)
Patient Name: [name], [name] MRN: [id number] Date of Birth: [birth year] Referring Physician: Arlan Organ, M.D. Date of Service: 2023-05-01 Radiation Oncologist: Arnette Schaumann, M.D. 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, including hip Intent: Curative     ==========DELIVERED PLANS==========  First Treatment Date: 2023-03-05 - Last Treatment Date: 2023-04-30   Plan Name: Ext_L_Knee Site: Knee, Left Technique: 3D Mode: Photon Dose Per Fraction: 1.8 Gy Prescribed Dose (Delivered / Prescribed): 68.4 Gy / 68.4 Gy Prescribed Fxs (Delivered / Prescribed): 38 / 38     ==========ON TREATMENT VISIT DATES========== 2023-03-11, 2023-03-18, 2023-03-25, 2023-04-01, 2023-04-08, 2023-04-15, 2023-04-22, 2023-04-29     ==========UPCOMING VISITS==========       ==========APPENDIX - ON TREATMENT VISIT NOTES==========   See weekly On Treatment Notes in Epic for details.

## 2023-05-01 NOTE — Progress Notes (Signed)
Phone [phone] In person visit   Subjective:   [name] is a 82 y.o. year old very pleasant female patient who presents for/with See problem oriented charting Chief Complaint  Patient presents with   Medical Management of Chronic Issues   Diabetes    Pt is here for 14 week f/u. DEE scheduled for 08/2023   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    Priority: High   Osteoporosis 01/22/2022    Priority: High   Pacemaker 07/09/2021    Priority: High   Mobitz type 2 second degree atrioventricular block 06/20/2021    Priority: High   PAT (paroxysmal atrial tachycardia) 06/20/2021    Priority: High   PAF (paroxysmal atrial fibrillation) (HCC) 10/16/2020    Priority: High   PAD (peripheral artery disease) (HCC) 01/14/2017    Priority: High   Type 2 diabetes mellitus with peripheral neuropathy (HCC) 10/09/2016    Priority: High   Mesenteric artery stenosis (HCC) 04/20/2014    Priority: High   Abdominal pain, epigastric 01/19/2014    Priority: High   Congestive heart failure with LV diastolic dysfunction, NYHA class 2 (HCC)     Priority: High   Celiac artery stenosis: 60% by Duplex - 90-95% by cath - Med Rx 11/27/2013    Priority: High   Renal artery stenosis (HCC) 08/29/2012    Priority: High   Coronary artery disease involving native coronary artery of native heart with angina pectoris (HCC) 10/30/2011    Priority: High   CAD S/P percutaneous coronary angioplasty: pRCA BMS, mLAD BMS overlapped prox with DES for ISR 07/30/2010    Priority: High   B12 deficiency 01/10/2022    Priority: Medium    Continuous severe abdominal pain 10/11/2016    Priority: Medium    Exertional dyspnea 03/16/2015    Priority: Medium    Idiopathic chronic pancreatitis suspected 03/10/2014    Priority: Medium    Abnormal CT of liver-possible cirrhosis 03/10/2014    Priority: Medium    Stasis edema of  bilateral lower extremity     Priority: Medium    Essential hypertension     Priority: Medium    Hyperlipidemia associated with type 2 diabetes mellitus (HCC)     Priority: Medium    Myalgia due to statin 12/29/2011    Priority: Medium    GERD (gastroesophageal reflux disease) 01/24/2009    Priority: Medium    Hypothyroid 11/12/2007    Priority: Medium    Irritable bowel syndrome 11/12/2007    Priority: Medium    PANCREAS DIVISUM 11/12/2007    Priority: Medium    Allergic rhinitis 01/10/2022    Priority: Low   Pneumothorax 07/10/2021    Priority: Low   Syncope and collapse 06/20/2021    Priority: Low   Hallux valgus (acquired), right foot 08/17/2018    Priority: Low   Costochondritis 01/21/2017    Priority: Low   Onychomycosis due to dermatophyte 11/16/2015    Priority: Low   Abnormal electrocardiogram during exercise stress test 03/15/2015    Priority: Low   Dysphagia 03/10/2014    Priority: Low   Long term current use of antithrombotics/antiplatelets - clopidogrel 03/10/2014    Priority: Low   Carotid stenosis 02/27/2014    Priority: Low   Leg cramps 01/19/2014    Priority: Low   Palpitations 01/12/2014    Priority: Low   Diarrhea due to malabsorption 01/24/2009  Priority: Low   Renal calculus 11/12/2007    Priority: Low   HIATAL HERNIA 11/20/2005    Priority: Low   Nodular fasciitis 12/20/2022    Medications- reviewed and updated Current Outpatient Medications  Medication Sig Dispense Refill   alendronate (FOSAMAX) 70 MG tablet TAKE 1 TABLET (70 MG TOTAL) BY MOUTH EVERY 7 DAYS WITH FULL GLASS WATER ON EMPTY STOMACH 12 tablet 3   allopurinol (ZYLOPRIM) 100 MG tablet Take 2 tablets (200 mg total) by mouth daily. 180 tablet 3   amiodarone (PACERONE) 200 MG tablet TAKE 400 MG (2 TABLETS) ONE DOSE AS NEED FOR A FAST HEART RATE ,IF AFTER 12 HOURS STILL PRESENT TAKE 200 MG ( 1 TABLET) ONE DOSE FOR EACH EPISODE 180 tablet 1   apixaban (ELIQUIS) 5 MG TABS tablet  Take 1 tablet (5 mg total) by mouth 2 (two) times daily. 180 tablet 3   Artificial Tear Solution (TEARS RENEWED OP) Apply 1 drop to eye daily as needed (dry eyes).     carvedilol (COREG) 6.25 MG tablet TAKE 1 TABLET BY MOUTH TWICE DAILY WITH A MEAL. MAY TAKE AN EXTRA TABLET DAILY IF HAVING PALPATIONS (Patient taking differently: Take 6.25 mg by mouth 2 (two) times daily with a meal.) 225 tablet 3   colchicine 0.6 MG tablet Take by mouth.     cyanocobalamin (VITAMIN B12) 1000 MCG/ML injection 1000 MCG INJECTION ONCE PER MONTH. PLEASE PROVIDE SYRINGES AS WELL 3 mL 5   diclofenac sodium (VOLTAREN) 1 % GEL Apply 2 g topically 4 (four) times daily as needed (pain).     furosemide (LASIX) 80 MG tablet Take 80 mg by mouth 2 (two) times daily.     hyoscyamine (ANASPAZ) 0.125 MG TBDP disintergrating tablet Place 1 tablet (0.125 mg total) under the tongue every 6 (six) hours as needed (abdominal cramping). 30 tablet 5   indapamide (LOZOL) 2.5 MG tablet TAKE ONE TABLET BY MOUTH DAILY, 30 MINUTES BEFORE DAILY FUROSEMIDE. 90 tablet 2   isosorbide mononitrate (IMDUR) 30 MG 24 hr tablet Take 1 tablet (30 mg total) by mouth daily. May take an extra dose if you use Nitroglycerin  sublingual tablet for 2 days afterwards 120 tablet 3   lidocaine (XYLOCAINE) 5 % ointment Apply 1 application. topically as needed. 35.44 g 0   losartan (COZAAR) 50 MG tablet TAKE 1 TABLET BY MOUTH EVERY DAY 90 tablet 3   nitroGLYCERIN (NITROSTAT) 0.4 MG SL tablet DISSOLVE 1 TABLET UNDER THE TONGUE EVERY 5 MINUTES AS NEEDED FOR CHEST PAIN 25 tablet 6   spironolactone (ALDACTONE) 25 MG tablet TAKE 1 TABLET BY MOUTH AS DIRECTED. TAKE 1 TABLET ON MONDAYS, WEDNESDAYS, AND FRIDAYS AT 6 PM. 45 tablet 3   HYDROcodone-acetaminophen (NORCO/VICODIN) 5-325 MG tablet Take 1 tablet by mouth every 6 (six) hours as needed for moderate pain. 30 tablet 0   meclizine (ANTIVERT) 12.5 MG tablet TAKE 1 TABLET BY MOUTH THREE TIMES A DAY AS NEEDED FOR DIZZINESS  (Patient not taking: Reported on 05/01/2023) 30 tablet 1   No current facility-administered medications for this visit.     Objective:  BP 122/70   Pulse 82   Temp 97.7 F (36.5 C)   Ht 5\' 8"  (1.727 m)   Wt 193 lb 3.2 oz (87.6 kg)   SpO2 96%   BMI 29.38 kg/m  Gen: NAD, resting comfortably CV: RRR no murmurs rubs or gallops Lungs: CTAB no crackles, wheeze, rhonchi Abdomen: soft/nontender/nondistended/normal bowel sounds. No rebound or guarding.  Ext: trace  edema- she reports worse on right- appears slightly worse on left to me Skin: warm, dry, patch of erythematous skin at radiation site not beyond marked lines     Assessment and Plan   # Primary myxofibrosarcoma-just had follow-up with Dr. Myna Hidalgo on 04/09/2023 -Prior resection with margins positive and had radiotherapy.  Plan is for 3-week follow-up from April 09, 2023  -did have radiation yesterday last session- is monitoring some redness but has not expanded outside area Dr. Roselind Messier has marked- is using silver nitrate- will reach out if worsens- thought to be related  #Left rib pain/chronic abdominal pain- ongoing severe pain intermittently for years- does need refill today  #some increased urinary frequency last few days- will check urine culture  #CAD with angina- on imdur- despite PCI x2 (last 2011) #hyperlipidemia but with myalgias related to statins #Mobitz type 2 second degree AV block s/p pacemaker November 2022 S: Medication: imdur 30 mg, coreg 6.25 mg BID, on eliquis 5 mg  twice daily -plavix discontinued - no cholesterol treatment as of 2023-statin intolerant and failed other lipid management - has not tried pcsk9 inhibitors -no chest pain . Stable shortness of breath  Lab Results  Component Value Date   CHOL 229 (H) 01/10/2022   HDL 36.90 (L) 01/10/2022   LDLCALC 152 (H) 09/08/2019   LDLDIRECT 147.0 01/10/2022   TRIG 327.0 (H) 01/10/2022   CHOLHDL 6 01/10/2022   A/P: coronary artery disease with stable  angina on imdur- continue current medications  Cholesterol high but she wants to hold off on all medication(s) as had muscle aches on statins and not interested in other options  # Atrial fibrillation- discovered by pacemaker interrogation (very symptomatic) S: Rate controlled on coreg 6.25 mg BID (extra tablet with palpitations) - on amiodarone 400 mg with fast HR, additional 200 12 hours later if persists per rx Anticoagulated with eliquis 5 mg BID A/P: appropriately anticoagulated and rate controlled- continue current medicine    # Diabetes S: Medication:Diet controlled Lab Results  Component Value Date   HGBA1C 6.1 (A) 05/01/2023   HGBA1C 6.4 12/27/2022   HGBA1C 6.5 10/17/2022  A/P:  stable- with updated a1c today. Continue no meds for now    #CHF diastolic, NYHA class III -also with venous stasis edema- wears compression #hypertension S: Medication:  lasix 80 mg (BID if needed for edema- mostly twice a day), indapaminde 2.5 mg, imdur 30 mg, losartan 50 mg, spironolactone 25 mg, carvedilol 6.25 mg twice daily  Edema: stable Weight gain:reports mild weight loss Shortness of breath:  baseline is exertional dyspnea if overdose it  A/P: CHF euvolemic continue current medications  Hypertension stable- continue current medicines    # B12 deficiency S: Current treatment/medication (oral vs. IM): injections once a month in past- wants to try B12 OTC (available over the counter without a prescription) 1000 mcg  Lab Results  Component Value Date   VITAMINB12 394 01/10/2022  A/P: reasonable control in past- update next full bloodwork   # Osteoporosis S: Last DEXA: Confirmed osteoporosis 01/22/2022 with worst T score -2.9  Medication (bisphosphonate or prolia): Fosamax started 02/04/2022  Calcium: 1200mg  (through diet ok) recommended-taking Vitamin D: 1000 units a day recommended- bought 2000 units at [employer] and will start A/P: hopefully stable- update DEXA next year. Continue current  meds for now    #Gout S: Medication: Allopurinol 100 mg daily started after uric acid of 9.9 was noted  -Has colchicine on hand -Does not tolerate prednisone well -Initially noted June  2023 with right index finger swollen Lab Results  Component Value Date   LABURIC 7.0 07/22/2022  A/P:next visit will need uric acid when we do full labs    Recommended follow up: Return in about 14 weeks (around 08/07/2023) for followup or sooner if needed.Schedule b4 you leave. Future Appointments  Date Time Provider Department Center  05/08/2023  9:30 AM [facility] None  05/08/2023  9:45 AM Ennever, Rose Phi, MD [facility] None  06/09/2023  4:00 PM Antony Blackbird, MD [facility] None  06/17/2023  4:30 PM Marykay Lex, MD [facility] None  07/11/2023  7:00 AM [facility] DEVICE REMOTES [facility]  10/10/2023  7:00 AM [facility] DEVICE REMOTES [facility]  01/09/2024  7:00 AM [facility] DEVICE REMOTES [facility]    Lab/Order associations:   ICD-10-CM   1. Type 2 diabetes mellitus with peripheral neuropathy (HCC)  E11.42 Urine Microalbumin w/creat. ratio    POCT glycosylated hemoglobin (Hb A1C)    2. Essential hypertension  I10     3. Urinary frequency  R35.0 Urine Culture    4. CAD S/P percutaneous coronary angioplasty: pRCA BMS, mLAD BMS overlapped prox with DES for ISR  I25.10    Z98.61     5. Congestive heart failure with LV diastolic dysfunction, NYHA class 2 (HCC)  I50.30       Meds ordered this encounter  Medications   HYDROcodone-acetaminophen (NORCO/VICODIN) 5-325 MG tablet    Sig: Take 1 tablet by mouth every 6 (six) hours as needed for moderate pain.    Dispense:  30 tablet    Refill:  0    Return precautions advised.  Tana Conch, MD

## 2023-05-08 ENCOUNTER — Other Ambulatory Visit: Payer: Self-pay

## 2023-05-08 ENCOUNTER — Inpatient Hospital Stay: Payer: Medicare Other | Attending: Hematology & Oncology

## 2023-05-08 ENCOUNTER — Inpatient Hospital Stay (HOSPITAL_BASED_OUTPATIENT_CLINIC_OR_DEPARTMENT_OTHER): Payer: Medicare Other | Admitting: Hematology & Oncology

## 2023-05-08 ENCOUNTER — Encounter: Payer: Self-pay | Admitting: *Deleted

## 2023-05-08 ENCOUNTER — Other Ambulatory Visit: Payer: Self-pay | Admitting: *Deleted

## 2023-05-08 ENCOUNTER — Encounter: Payer: Self-pay | Admitting: Hematology & Oncology

## 2023-05-08 VITALS — BP 136/51 | HR 68 | Temp 98.2°F | Resp 18 | Ht 68.0 in | Wt 193.1 lb

## 2023-05-08 DIAGNOSIS — E1142 Type 2 diabetes mellitus with diabetic polyneuropathy: Secondary | ICD-10-CM

## 2023-05-08 DIAGNOSIS — R350 Frequency of micturition: Secondary | ICD-10-CM

## 2023-05-08 DIAGNOSIS — C44709 Unspecified malignant neoplasm of skin of left lower limb, including hip: Secondary | ICD-10-CM | POA: Insufficient documentation

## 2023-05-08 DIAGNOSIS — C499 Malignant neoplasm of connective and soft tissue, unspecified: Secondary | ICD-10-CM

## 2023-05-08 DIAGNOSIS — L598 Other specified disorders of the skin and subcutaneous tissue related to radiation: Secondary | ICD-10-CM | POA: Diagnosis not present

## 2023-05-08 DIAGNOSIS — C481 Malignant neoplasm of specified parts of peritoneum: Secondary | ICD-10-CM

## 2023-05-08 LAB — CBC WITH DIFFERENTIAL (CANCER CENTER ONLY)
Abs Immature Granulocytes: 0.01 10*3/uL (ref 0.00–0.07)
Basophils Absolute: 0.1 10*3/uL (ref 0.0–0.1)
Basophils Relative: 1 %
Eosinophils Absolute: 0.1 10*3/uL (ref 0.0–0.5)
Eosinophils Relative: 2 %
HCT: 36.6 % (ref 36.0–46.0)
Hemoglobin: 12.1 g/dL (ref 12.0–15.0)
Immature Granulocytes: 0 %
Lymphocytes Relative: 18 %
Lymphs Abs: 1.1 10*3/uL (ref 0.7–4.0)
MCH: 29.6 pg (ref 26.0–34.0)
MCHC: 33.1 g/dL (ref 30.0–36.0)
MCV: 89.5 fL (ref 80.0–100.0)
Monocytes Absolute: 0.6 10*3/uL (ref 0.1–1.0)
Monocytes Relative: 10 %
Neutro Abs: 4.2 10*3/uL (ref 1.7–7.7)
Neutrophils Relative %: 69 %
Platelet Count: 235 10*3/uL (ref 150–400)
RBC: 4.09 MIL/uL (ref 3.87–5.11)
RDW: 14.2 % (ref 11.5–15.5)
WBC Count: 6.1 10*3/uL (ref 4.0–10.5)
nRBC: 0 % (ref 0.0–0.2)

## 2023-05-08 LAB — CMP (CANCER CENTER ONLY)
ALT: 6 U/L (ref 0–44)
AST: 9 U/L — ABNORMAL LOW (ref 15–41)
Albumin: 4.1 g/dL (ref 3.5–5.0)
Alkaline Phosphatase: 74 U/L (ref 38–126)
Anion gap: 6 (ref 5–15)
BUN: 25 mg/dL — ABNORMAL HIGH (ref 8–23)
CO2: 33 mmol/L — ABNORMAL HIGH (ref 22–32)
Calcium: 9.5 mg/dL (ref 8.9–10.3)
Chloride: 99 mmol/L (ref 98–111)
Creatinine: 0.93 mg/dL (ref 0.44–1.00)
GFR, Estimated: >60 mL/min (ref >60)
Glucose, Bld: 112 mg/dL — ABNORMAL HIGH (ref 70–99)
Potassium: 4 mmol/L (ref 3.5–5.1)
Sodium: 138 mmol/L (ref 135–145)
Total Bilirubin: 0.7 mg/dL (ref 0.3–1.2)
Total Protein: 6.9 g/dL (ref 6.5–8.1)

## 2023-05-08 LAB — LACTATE DEHYDROGENASE: LDH: 150 U/L (ref 98–192)

## 2023-05-08 MED ORDER — CEPHALEXIN 500 MG PO CAPS: 500.0000 mg | ORAL_CAPSULE | Freq: Three times a day (TID) | ORAL | 0 refills | Status: DC

## 2023-05-08 NOTE — Progress Notes (Signed)
Patient has completed radiation and will need post treatment CTs.   Oncology Nurse Navigator Documentation     05/08/2023   10:00 AM  Oncology Nurse Navigator Flowsheets  Phase of Treatment Radiation  Radiation Actual End Date: 04/30/2023  Navigator Follow Up Date: 06/03/2023  Navigator Follow Up Reason: Scan Review  Navigator Location [facility]  Navigator Encounter Type Follow-up Appt;Appt/Treatment Plan Review  Patient Visit Type MedOnc  Treatment Phase Post-Tx Follow-up  Barriers/Navigation Needs No Barriers At This Time  Interventions Psycho-Social Support  Acuity Level 1-No Barriers  Support Groups/Services Friends and Family  Time Spent with Patient 15

## 2023-05-08 NOTE — Progress Notes (Signed)
Hematology and Oncology Follow Up Visit  NEVEEN ULRICK [id number] April 27, 1941 82 y.o. 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 Moussa is back for follow-up.  She completed all of her radiation therapy.  This was to the left knee.  She did well with this.  Not surprisingly, she does have radiation dermatitis.  She has a couple areas of open sores.  I do not see any obvious infection.  She apparently has been applying Silvadene cream.  She has had this burns.  I will going to send in some Keflex (500 mg p.o. 3 times daily) and have her take this to make sure does not have any infection setting.  Otherwise, she is doing well.  She has had no cough or shortness of breath.  She has had no nausea or vomiting.  She has had no change in bowel or bladder habits.  There has been no bleeding.  She has had maybe a little bit of swelling in the left leg.  She has had no fever.  Overall, I would have said that her performance status is probably ECOG 1.    Medications:  Current Outpatient Medications:    alendronate (FOSAMAX) 70 MG tablet, TAKE 1 TABLET (70 MG TOTAL) BY MOUTH EVERY 7 DAYS WITH FULL GLASS WATER ON EMPTY STOMACH, Disp: 12 tablet, Rfl: 3   allopurinol (ZYLOPRIM) 100 MG tablet, Take 2 tablets (200 mg total) by mouth daily., Disp: 180 tablet, Rfl: 3   amiodarone (PACERONE) 200 MG tablet, TAKE 400 MG (2 TABLETS) ONE DOSE AS NEED FOR A FAST HEART RATE ,IF AFTER 12 HOURS STILL PRESENT TAKE 200 MG ( 1 TABLET) ONE DOSE FOR EACH EPISODE, Disp: 180 tablet, Rfl: 1   apixaban (ELIQUIS) 5 MG TABS tablet, Take 1 tablet (5 mg total) by mouth 2 (two) times daily., Disp: 180 tablet, Rfl: 3   Artificial Tear Solution (TEARS RENEWED OP), Apply 1 drop to eye daily as needed (dry eyes)., Disp: , Rfl:    carvedilol (COREG) 6.25  MG tablet, TAKE 1 TABLET BY MOUTH TWICE DAILY WITH A MEAL. MAY TAKE AN EXTRA TABLET DAILY IF HAVING PALPATIONS (Patient taking differently: Take 6.25 mg by mouth 2 (two) times daily with a meal.), Disp: 225 tablet, Rfl: 3   cyanocobalamin (VITAMIN B12) 1000 MCG/ML injection, 1000 MCG INJECTION ONCE PER MONTH. PLEASE PROVIDE SYRINGES AS WELL, Disp: 3 mL, Rfl: 5   diclofenac sodium (VOLTAREN) 1 % GEL, Apply 2 g topically 4 (four) times daily as needed (pain)., Disp: , Rfl:    furosemide (LASIX) 80 MG tablet, Take 80 mg by mouth 2 (two) times daily., Disp: , Rfl:    HYDROcodone-acetaminophen (NORCO/VICODIN) 5-325 MG tablet, Take 1 tablet by mouth every 6 (six) hours as needed for moderate pain., Disp: 30 tablet, Rfl: 0   hyoscyamine (ANASPAZ) 0.125 MG TBDP disintergrating tablet, Place 1 tablet (0.125 mg total) under the tongue every 6 (six) hours as needed (abdominal cramping)., Disp: 30 tablet, Rfl: 5   indapamide (LOZOL) 2.5 MG tablet, TAKE ONE TABLET BY MOUTH DAILY, 30 MINUTES BEFORE DAILY FUROSEMIDE., Disp: 90 tablet, Rfl: 2   isosorbide mononitrate (IMDUR) 30 MG 24 hr tablet, Take 1 tablet (30 mg total) by mouth daily. May take an extra dose if you use Nitroglycerin  sublingual tablet for 2 days afterwards, Disp:  120 tablet, Rfl: 3   lidocaine (XYLOCAINE) 5 % ointment, Apply 1 application. topically as needed., Disp: 35.44 g, Rfl: 0   losartan (COZAAR) 50 MG tablet, TAKE 1 TABLET BY MOUTH EVERY DAY, Disp: 90 tablet, Rfl: 3   meclizine (ANTIVERT) 12.5 MG tablet, TAKE 1 TABLET BY MOUTH THREE TIMES A DAY AS NEEDED FOR DIZZINESS, Disp: 30 tablet, Rfl: 1   spironolactone (ALDACTONE) 25 MG tablet, TAKE 1 TABLET BY MOUTH AS DIRECTED. TAKE 1 TABLET ON MONDAYS, WEDNESDAYS, AND FRIDAYS AT 6 PM., Disp: 45 tablet, Rfl: 3   colchicine 0.6 MG tablet, Take by mouth. (Patient not taking: Reported on 05/08/2023), Disp: , Rfl:    nitroGLYCERIN (NITROSTAT) 0.4 MG SL tablet, DISSOLVE 1 TABLET UNDER THE TONGUE EVERY 5  MINUTES AS NEEDED FOR CHEST PAIN (Patient not taking: Reported on 05/08/2023), Disp: 25 tablet, Rfl: 6  Allergies:  Allergies  Allergen Reactions   Codeine Phosphate Anaphylaxis, Shortness Of Breath and Swelling   Contrast Media [Iodinated Contrast Media] Swelling    Swelling of the face. Reaction was to ionic contrast many years ago. Patient has had non-ionic contrast many time without pre medication and has had no reaction.    Statins Other (See Comments)    Hyperactivity   Tramadol Palpitations and Other (See Comments)    Hyperactivity    Past Medical History, Surgical history, Social history, and Family History were reviewed and updated.  Review of Systems: Review of Systems  Constitutional: Negative.   HENT:  Negative.    Eyes: Negative.   Respiratory: Negative.    Cardiovascular: Negative.   Gastrointestinal: Negative.   Endocrine: Negative.   Genitourinary: Negative.    Musculoskeletal:  Positive for arthralgias.  Neurological: Negative.   Hematological: Negative.   Psychiatric/Behavioral: Negative.      Physical Exam:  height is 5\' 8"  (1.727 m) and weight is 193 lb 1.3 oz (87.6 kg). Her oral temperature is 98.2 F (36.8 C). Her blood pressure is 136/51 (abnormal) and her pulse is 68. Her respiration is 18 and oxygen saturation is 99%.   Wt Readings from Last 3 Encounters:  05/08/23 193 lb 1.3 oz (87.6 kg)  05/01/23 193 lb 3.2 oz (87.6 kg)  04/09/23 195 lb 1.9 oz (88.5 kg)    Physical Exam Vitals reviewed.  HENT:     Head: Normocephalic and atraumatic.  Eyes:     Pupils: Pupils are equal, round, and reactive to light.  Cardiovascular:     Rate and Rhythm: Normal rate and regular rhythm.     Heart sounds: Normal heart sounds.  Pulmonary:     Effort: Pulmonary effort is normal.     Breath sounds: Normal breath sounds.  Abdominal:     General: Bowel sounds are normal.     Palpations: Abdomen is soft.  Musculoskeletal:        General: No tenderness or  deformity. Normal range of motion.     Cervical back: Normal range of motion.     Comments: Behind the left knee is a surgical scar.  This is well-healed.  She has significant radiation dermatitis.  There is couple areas of open sores.  There is no obvious discharge.  She has a little bit of swelling.    Lymphadenopathy:     Cervical: No cervical adenopathy.  Skin:    General: Skin is warm and dry.     Findings: No erythema or rash.  Neurological:     Mental Status: She is alert and oriented  to person, place, and time.  Psychiatric:        Behavior: Behavior normal.        Thought Content: Thought content normal.        Judgment: Judgment normal.      Lab Results  Component Value Date   WBC 6.1 05/08/2023   HGB 12.1 05/08/2023   HCT 36.6 05/08/2023   MCV 89.5 05/08/2023   PLT 235 05/08/2023     Chemistry      Component Value Date/Time   NA 139 04/09/2023 1148   NA 136 04/29/2022 1230   K 4.8 04/09/2023 1148   CL 100 04/09/2023 1148   CO2 32 04/09/2023 1148   BUN 26 (H) 04/09/2023 1148   BUN 30 (H) 04/29/2022 1230   CREATININE 0.89 04/09/2023 1148   CREATININE 0.90 10/24/2016 1127      Component Value Date/Time   CALCIUM 9.8 04/09/2023 1148   ALKPHOS 63 04/09/2023 1148   AST 9 (L) 04/09/2023 1148   ALT 8 04/09/2023 1148   BILITOT 0.5 04/09/2023 1148       Impression and Plan: Ms. Bowlen is a very charming 82 year old white female.  She has a sarcoma behind the left knee in the popliteal fossa.  This is a fairly high-grade sarcoma.  As such, she had this resected.  Her margins were positive.  We have given her radiotherapy.  She completed this a week ago.  We probably would not do an MRI for at least 2 months.  She is very worried about the cancer spreading.  We will set her up with a CT scan of the chest/abdomen/pelvis and do this in about a month.  I would think that this should be okay.  I know that this is a possibility with sarcomas that there is spread.  I  would like to see her back in about 5 weeks.  When we see her back, we will then set her up with the MRI.  I have sent the Keflex prescription to her pharmacy.   Josph Macho, MD 9/5/202410:05 AM

## 2023-05-15 ENCOUNTER — Other Ambulatory Visit: Payer: Self-pay | Admitting: Cardiology

## 2023-06-03 ENCOUNTER — Ambulatory Visit (HOSPITAL_BASED_OUTPATIENT_CLINIC_OR_DEPARTMENT_OTHER)
Admission: RE | Admit: 2023-06-03 | Discharge: 2023-06-03 | Disposition: A | Discharge status: Home / Self Care | Payer: Medicare Other | Source: Ambulatory Visit | Attending: Hematology & Oncology | Admitting: Hematology & Oncology

## 2023-06-03 DIAGNOSIS — K5730 Diverticulosis of large intestine without perforation or abscess without bleeding: Secondary | ICD-10-CM | POA: Diagnosis not present

## 2023-06-03 DIAGNOSIS — C481 Malignant neoplasm of specified parts of peritoneum: Secondary | ICD-10-CM | POA: Insufficient documentation

## 2023-06-03 DIAGNOSIS — C499 Malignant neoplasm of connective and soft tissue, unspecified: Secondary | ICD-10-CM | POA: Diagnosis not present

## 2023-06-03 DIAGNOSIS — D3502 Benign neoplasm of left adrenal gland: Secondary | ICD-10-CM | POA: Diagnosis not present

## 2023-06-06 ENCOUNTER — Encounter: Payer: Self-pay | Admitting: Radiation Oncology

## 2023-06-07 NOTE — Progress Notes (Addendum)
  Radiation Oncology         [phone] ________________________________  Name: [name] MRN: [id number]  Date: 06/09/2023  DOB: 05/30/1941  End of Treatment Note  Diagnosis:  The primary encounter diagnosis was Sarcoma of lower extremity, left (HCC). A diagnosis of Primary myxofibrosarcoma (HCC) was also pertinent to this visit.   Stage IIIB (T2N0M0G3) myxofibrosarcoma of the left popliteal fossa: s/p resection with positive margins      Indication for treatment: Curative        Radiation treatment dates: 03/05/23 through 04/30/23   Site/dose: Left popliteal fossa/surgical site- 68.4 Gy delivered in 38 Fx at 1.8 Gy/Fx  Technique/Mode: 3D / Photon   Beams/energy: 6X  Narrative: The patient tolerated radiation treatment relatively well. During her final weekly treatment check on 04/29/23, the patient endorsed a burning sensation and pain to left knee, fatigue, redness and warmth to the left knee, and swelling to the left leg. Physical exam performed on that same date showed hyperpigmentation changes to the treatment area. The surgical scar appeared well-healed without signs of drainage or infection. Some erythema and hyperpigmentation changes were also noted along the medial lower inner thigh, located near one of her radiation entrance points.  This did not appear infected.   Plan: The patient has completed radiation treatment. The patient will return to radiation oncology clinic for routine followup in one month. I advised them to call or return sooner if they have any questions or concerns related to their recovery or treatment.  -----------------------------------  Billie Lade, PhD, MD  This document serves as a record of services personally performed by Antony Blackbird, MD. It was created on his behalf by Neena Rhymes, a trained medical scribe. The creation of this record is based on the scribe's personal observations and the provider's statements to them. This document has  been checked and approved by the attending provider.

## 2023-06-09 ENCOUNTER — Encounter: Payer: Self-pay | Admitting: Radiation Oncology

## 2023-06-09 ENCOUNTER — Ambulatory Visit
Admission: RE | Admit: 2023-06-09 | Discharge: 2023-06-09 | Disposition: A | Discharge status: Home / Self Care | Payer: Medicare Other | Source: Ambulatory Visit | Attending: Radiation Oncology | Admitting: Radiation Oncology

## 2023-06-09 ENCOUNTER — Other Ambulatory Visit: Payer: Self-pay

## 2023-06-09 VITALS — BP 143/62 | HR 92 | Temp 97.7°F | Resp 20 | Ht 68.0 in | Wt 196.4 lb

## 2023-06-09 DIAGNOSIS — C4922 Malignant neoplasm of connective and soft tissue of left lower limb, including hip: Secondary | ICD-10-CM | POA: Insufficient documentation

## 2023-06-09 DIAGNOSIS — Z923 Personal history of irradiation: Secondary | ICD-10-CM | POA: Insufficient documentation

## 2023-06-09 DIAGNOSIS — C499 Malignant neoplasm of connective and soft tissue, unspecified: Secondary | ICD-10-CM

## 2023-06-09 HISTORY — DX: Personal history of irradiation: Z92.3

## 2023-06-09 NOTE — Progress Notes (Addendum)
[name] is here today for follow up post radiation to the left knee  They completed their radiation on: 04/30/2023  Does the patient complain of any of the following:  Pain: 2/10 on the back of the knee. Fatigue:She reports that she is severely tired. Swelling: She reports swelling in her leg. Post radiation skin changes: She reports dark spots on top of foot and ankles.    BP (!) 143/62 (BP Location: Left Arm, Patient Position: Sitting, Cuff Size: Large)   Pulse 92   Temp 97.7 F (36.5 C)   Resp 20   Ht 5\' 8"  (1.727 m)   Wt 196 lb 6.4 oz (89.1 kg)   SpO2 96%   BMI 29.86 kg/m  :

## 2023-06-09 NOTE — Progress Notes (Signed)
Radiation Oncology         [phone]) [phone] ________________________________  Name: [name] MRN: [id number]  Date: 06/09/2023  DOB: 1941-03-24  Follow-Up Visit Note  CC: Shelva Majestic, MD  Josph Macho, MD    ICD-10-CM   1. 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 Primary myxofibrosarcoma (HCC)  C49.9       Diagnosis:    The primary encounter diagnosis was Sarcoma of lower extremity, left (HCC). A diagnosis of Primary myxofibrosarcoma (HCC) was also pertinent to this visit.   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 She tolerated radiation treatment relatively well. During her final weekly treatment check on 04/29/23, the patient endorsed a burning sensation and pain to left knee, fatigue, redness and warmth to the left knee, and swelling to the left leg. Physical exam performed on that same date showed hyperpigmentation changes to the treatment area. The surgical scar appeared well-healed without signs of drainage or infection. Some erythema and hyperpigmentation changes were also noted along the medial lower inner thigh, located near one of her radiation entrance points. This did not appear infected.         Since completing radiation therapy, the patient followed up with Dr. Myna Hidalgo on 05/08/23. For her areas of radiation dermatitis / open sores, Dr. Myna Hidalgo has prescribed her a course of Keflex to prevent infection.  She continue placing Silvadene which we have given her.   To evaluate for metastatic disease in the setting of  her histology, Dr. Myna Hidalgo ordered a CT CAP which was performed on 06/03/23.  This scan showed no evidence of metastatic disease.  No other significant oncologic interval history since she completed radiation therapy, or in the interval since her initial consultation date.   On evaluation today she continues to have a lot of fatigue but continues to be very active.  She notices some swelling in both lower extremities but this improves significantly overnight with rest.  She reports some very mild pain along the surgical bed but does not need to take any pain medication for this issue.  She denies any itching or further skin issues.                         Allergies:  is allergic to codeine phosphate, contrast media [iodinated contrast media], statins, and tramadol.  Meds: Current Outpatient Medications  Medication Sig Dispense Refill   alendronate (FOSAMAX) 70 MG tablet TAKE 1 TABLET (70 MG TOTAL) BY MOUTH EVERY 7 DAYS WITH FULL GLASS WATER ON EMPTY STOMACH 12 tablet 3   allopurinol (ZYLOPRIM) 100 MG tablet Take 2 tablets (200 mg total) by mouth daily. 180 tablet 3   amiodarone (PACERONE) 200 MG tablet TAKE 400 MG (2 TABLETS) ONE DOSE AS NEED FOR A FAST HEART RATE ,IF AFTER 12 HOURS STILL PRESENT TAKE 200 MG ( 1 TABLET) ONE DOSE FOR EACH EPISODE 180 tablet 1   apixaban (ELIQUIS) 5 MG TABS tablet Take 1 tablet (5 mg  total) by mouth 2 (two) times daily. 180 tablet 3   Artificial Tear Solution (TEARS RENEWED OP) Apply 1 drop to eye daily as needed (dry eyes).     carvedilol (COREG) 6.25 MG tablet TAKE 1 TABLET BY MOUTH TWICE DAILY WITH A MEAL. MAY TAKE AN EXTRA TABLET DAILY IF HAVING PALPATIONS (Patient taking differently: Take 6.25 mg by mouth 2 (two) times daily with a meal.) 225 tablet 3   cyanocobalamin (VITAMIN B12) 1000 MCG/ML injection 1000 MCG INJECTION ONCE PER MONTH. PLEASE PROVIDE SYRINGES AS WELL 3 mL 5   diclofenac sodium (VOLTAREN) 1 % GEL Apply 2 g topically 4 (four) times daily as  needed (pain).     furosemide (LASIX) 80 MG tablet Take 80 mg by mouth 2 (two) times daily.     HYDROcodone-acetaminophen (NORCO/VICODIN) 5-325 MG tablet Take 1 tablet by mouth every 6 (six) hours as needed for moderate pain. 30 tablet 0   hyoscyamine (ANASPAZ) 0.125 MG TBDP disintergrating tablet Place 1 tablet (0.125 mg total) under the tongue every 6 (six) hours as needed (abdominal cramping). 30 tablet 5   indapamide (LOZOL) 2.5 MG tablet TAKE ONE TABLET BY MOUTH DAILY, 30 MINUTES BEFORE DAILY FUROSEMIDE. 90 tablet 2   isosorbide mononitrate (IMDUR) 30 MG 24 hr tablet Take 1 tablet (30 mg total) by mouth daily. May take an extra dose if you use Nitroglycerin  sublingual tablet for 2 days afterwards 120 tablet 3   lidocaine (XYLOCAINE) 5 % ointment Apply 1 application. topically as needed. 35.44 g 0   losartan (COZAAR) 50 MG tablet TAKE 1 TABLET BY MOUTH EVERY DAY 90 tablet 3   meclizine (ANTIVERT) 12.5 MG tablet TAKE 1 TABLET BY MOUTH THREE TIMES A DAY AS NEEDED FOR DIZZINESS 30 tablet 1   spironolactone (ALDACTONE) 25 MG tablet TAKE 1 TABLET BY MOUTH AS DIRECTED. TAKE 1 TABLET ON MONDAYS, WEDNESDAYS, AND FRIDAYS AT 6 PM. 45 tablet 3   cephALEXin (KEFLEX) 500 MG capsule Take 1 capsule (500 mg total) by mouth 3 (three) times daily. (Patient not taking: Reported on 06/09/2023) 21 capsule 0   colchicine 0.6 MG tablet Take by mouth. (Patient not taking: Reported on 05/08/2023)     nitroGLYCERIN (NITROSTAT) 0.4 MG SL tablet DISSOLVE 1 TABLET UNDER THE TONGUE EVERY 5 MINUTES AS NEEDED FOR CHEST PAIN (Patient not taking: Reported on 05/08/2023) 25 tablet 6   No current facility-administered medications for this encounter.    Physical Findings: The patient is in no acute distress. Patient is alert and oriented.  height is 5\' 8"  (1.727 m) and weight is 196 lb 6.4 oz (89.1 kg). Her temperature is 97.7 F (36.5 C). Her blood pressure is 143/62 (abnormal) and her pulse is 92. Her respiration is 20 and oxygen  saturation is 96%. .  No significant changes. Lungs are clear to auscultation bilaterally. Heart has regular rate and rhythm. No palpable cervical, supraclavicular, or axillary adenopathy. Abdomen soft, non-tender, normal bowel sounds.  Examination of the left popliteal fossa area reveals skin to be well-healed.  Some areas of hyperpigmentation changes noted.  Some induration along the surgical scar but no obvious signs of recurrence.  No drainage appreciated.  Some edema noted in both lower extremities.   Lab Findings: Lab Results  Component Value Date   WBC 6.1 05/08/2023   HGB 12.1 05/08/2023   HCT 36.6 05/08/2023   MCV 89.5 05/08/2023   PLT 235 05/08/2023    Radiographic Findings: CT CHEST ABDOMEN PELVIS WO CONTRAST  Result Date: 06/09/2023 CLINICAL DATA:  Staging workup for left lower extremity myxofibrosarcoma in the subcutaneous tissues posterior to the distal femur. * Tracking Code: BO * EXAM: CT CHEST, ABDOMEN AND PELVIS WITHOUT CONTRAST TECHNIQUE: Multidetector CT imaging of the chest, abdomen and pelvis was performed following the standard protocol without IV contrast. RADIATION DOSE REDUCTION: This exam was performed according to the departmental dose-optimization program which includes automated exposure control, adjustment of the mA and/or kV according to patient size and/or use of iterative reconstruction technique. COMPARISON:  Multiple exams, including 02/13/2023 (chest) and 06/03/2022 (abdomen/pelvis) FINDINGS: CT CHEST FINDINGS Cardiovascular: Dual lead pacer noted. Coronary, aortic arch, and branch vessel atherosclerotic vascular disease. Mediastinum/Nodes: Unremarkable Lungs/Pleura: No significant findings Musculoskeletal: Degenerative glenohumeral arthropathy bilaterally. Thoracic spondylosis. Posterolateral rod and pedicle screw fixation spanning from T10 through S1. CT ABDOMEN PELVIS FINDINGS Hepatobiliary: Stable pneumobilia. Stable mildly prominent caliber of the common bile  duct at proximally 1.1 cm. Gallbladder absent. Pancreas: Unremarkable Spleen: Unremarkable Adrenals/Urinary Tract: 1.2 by 1.9 cm left adrenal adenoma, internal density -8 Hounsfield units. 4 mm right kidney lower pole nonobstructive renal calculus. Anterior superior urinary bladder is herniated through a small lower abdominal wall defect in the linea al but just above the pubis as on image 125 series 601. Stomach/Bowel: Prominent stool throughout the colon favors constipation. 5.6 cm segment of prominent wall thickening in the sigmoid colon on image 103 of series 301 with confluent diverticulosis in this region. Strictly speaking underlying colon tumor is not excluded but this had a similar appearance on 06/03/2022. Low-grade diverticulitis not excluded given the faint stranding posterior to this segment of bowel on image 104 series 301 although the appearance is likewise similar to 06/03/2022. Vascular/Lymphatic: Atherosclerosis is present, including aortoiliac atherosclerotic disease. Marked atheromatous vascular disease of the abdominal aorta and its branches. Suspected vascular stents in the celiac trunk and left renal artery. Atheromatous narrowing of the proximal SMA, patency not assessed on today's noncontrast exam. No pathologic adenopathy identified. Reproductive: Uterus absent.  Adnexa unremarkable. Other: No supplemental non-categorized findings. Musculoskeletal: Multilevel thoracolumbar fusion along with SI joint fusion and a right iliac bone screw. In the lower thoracic and lumbar spine region there a shell of bone around the spinal apparatus. Posterior decompression in the lumbar spine with large posterior chronic fluid collection on image 76 series 301, with posterior margin partially bounded by the bony shell. This is a similar appearance to prior exams including 06/03/2022 and MRI of 05/10/2013. IMPRESSION: 1. No findings of metastatic disease. 2. 5.6 cm segment of prominent wall thickening in the  sigmoid colon with confluent diverticulosis in this region. Strictly speaking underlying colon tumor is not excluded but this had a similar appearance on 06/03/2022. Low-grade diverticulitis not excluded given the faint stranding posterior to this segment of bowel although the appearance is likewise similar to 06/03/2022. 3. Prominent stool throughout the colon favors constipation. 4. Aortic atherosclerosis. 5. Left adrenal adenoma. No further imaging workup of this lesion is indicated. 6. Anterior superior urinary bladder is herniated through a small lower abdominal wall defect in the linea al but just above the pubis. 7. Nonobstructive right nephrolithiasis. 8. Multilevel thoracolumbar fusion along with SI joint fusion and a right iliac bone screw. Bony tissue surrounds the multilevel apparatus informs a shell like posterior margin. Posterior decompression in the lumbar spine with chronic large posterior lumbar fluid collection as on 05/10/2013, with posterior margin partially bounded by the bony shell. This is a similar appearance to prior exams including 06/03/2022. 9.  Extensive atheromatous vascular disease including the coronary arteries. Aortic Atherosclerosis (ICD10-I70.0). Electronically Signed   By: Gaylyn Rong M.D.   On: 06/09/2023 15:09    Impression: The primary encounter diagnosis was Sarcoma of lower extremity, left (HCC). A diagnosis of Primary myxofibrosarcoma (HCC) was also pertinent to this visit.   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  Her skin is healed well at this time.  She continues to have some residual fatigue but is able to carry on her usual activities.  Recent scan shows no evidence of metastatic disease.  She reports that she will undergo a MRI of the surgical site in the near future and then follow-up with her surgeon in [place].  Plan:  Routine follow-up in 3 months.  She is scheduled to follow-up with Dr. Myna Hidalgo later this week.   ____________________________________  Billie Lade, PhD, MD  This document serves as a record of services personally performed by Antony Blackbird, MD. It was created on his behalf by Neena Rhymes, a trained medical scribe. The creation of this record is based on the scribe's personal observations and the provider's statements to them. This document has been checked and approved by the attending provider.

## 2023-06-10 ENCOUNTER — Encounter: Payer: Self-pay | Admitting: *Deleted

## 2023-06-10 NOTE — Progress Notes (Signed)
Reviewed CT scan which is negative for metastatic disease.   Oncology Nurse Navigator Documentation     06/10/2023    7:30 AM  Oncology Nurse Navigator Flowsheets  Navigator Follow Up Date: 06/12/2023  Navigator Follow Up Reason: Follow-up Appointment  Navigator Location [facility]  Navigator Encounter Type Scan Review  Patient Visit Type MedOnc  Treatment Phase Post-Tx Follow-up  Barriers/Navigation Needs No Barriers At This Time  Interventions None Required  Acuity Level 1-No Barriers  Support Groups/Services Friends and Family  Time Spent with Patient 15

## 2023-06-12 ENCOUNTER — Inpatient Hospital Stay: Payer: Medicare Other | Attending: Hematology & Oncology

## 2023-06-12 ENCOUNTER — Encounter: Payer: Self-pay | Admitting: Hematology & Oncology

## 2023-06-12 ENCOUNTER — Inpatient Hospital Stay (HOSPITAL_BASED_OUTPATIENT_CLINIC_OR_DEPARTMENT_OTHER): Payer: Medicare Other | Admitting: Hematology & Oncology

## 2023-06-12 ENCOUNTER — Encounter: Payer: Self-pay | Admitting: *Deleted

## 2023-06-12 ENCOUNTER — Other Ambulatory Visit: Payer: Self-pay

## 2023-06-12 VITALS — BP 152/55 | HR 83 | Temp 97.9°F | Resp 19 | Ht 68.0 in | Wt 196.1 lb

## 2023-06-12 DIAGNOSIS — C499 Malignant neoplasm of connective and soft tissue, unspecified: Secondary | ICD-10-CM

## 2023-06-12 DIAGNOSIS — C4922 Malignant neoplasm of connective and soft tissue of left lower limb, including hip: Secondary | ICD-10-CM | POA: Diagnosis not present

## 2023-06-12 DIAGNOSIS — M2550 Pain in unspecified joint: Secondary | ICD-10-CM | POA: Diagnosis not present

## 2023-06-12 DIAGNOSIS — Z79899 Other long term (current) drug therapy: Secondary | ICD-10-CM | POA: Insufficient documentation

## 2023-06-12 DIAGNOSIS — Z7901 Long term (current) use of anticoagulants: Secondary | ICD-10-CM | POA: Insufficient documentation

## 2023-06-12 DIAGNOSIS — R5383 Other fatigue: Secondary | ICD-10-CM | POA: Diagnosis not present

## 2023-06-12 DIAGNOSIS — Z91041 Radiographic dye allergy status: Secondary | ICD-10-CM | POA: Diagnosis not present

## 2023-06-12 DIAGNOSIS — R197 Diarrhea, unspecified: Secondary | ICD-10-CM | POA: Diagnosis not present

## 2023-06-12 DIAGNOSIS — Z888 Allergy status to other drugs, medicaments and biological substances status: Secondary | ICD-10-CM | POA: Insufficient documentation

## 2023-06-12 DIAGNOSIS — F419 Anxiety disorder, unspecified: Secondary | ICD-10-CM | POA: Diagnosis not present

## 2023-06-12 DIAGNOSIS — Z885 Allergy status to narcotic agent status: Secondary | ICD-10-CM | POA: Diagnosis not present

## 2023-06-12 LAB — CMP (CANCER CENTER ONLY)
ALT: 7 U/L (ref 0–44)
AST: 9 U/L — ABNORMAL LOW (ref 15–41)
Albumin: 3.9 g/dL (ref 3.5–5.0)
Alkaline Phosphatase: 72 U/L (ref 38–126)
Anion gap: 9 (ref 5–15)
BUN: 28 mg/dL — ABNORMAL HIGH (ref 8–23)
CO2: 29 mmol/L (ref 22–32)
Calcium: 9.3 mg/dL (ref 8.9–10.3)
Chloride: 101 mmol/L (ref 98–111)
Creatinine: 1.1 mg/dL — ABNORMAL HIGH (ref 0.44–1.00)
GFR, Estimated: 50 mL/min — ABNORMAL LOW (ref >60)
Glucose, Bld: 112 mg/dL — ABNORMAL HIGH (ref 70–99)
Potassium: 4.8 mmol/L (ref 3.5–5.1)
Sodium: 139 mmol/L (ref 135–145)
Total Bilirubin: 0.4 mg/dL (ref 0.3–1.2)
Total Protein: 7.1 g/dL (ref 6.5–8.1)

## 2023-06-12 LAB — CBC WITH DIFFERENTIAL (CANCER CENTER ONLY)
Abs Immature Granulocytes: 0.01 10*3/uL (ref 0.00–0.07)
Basophils Absolute: 0.1 10*3/uL (ref 0.0–0.1)
Basophils Relative: 1 %
Eosinophils Absolute: 0.2 10*3/uL (ref 0.0–0.5)
Eosinophils Relative: 3 %
HCT: 35.8 % — ABNORMAL LOW (ref 36.0–46.0)
Hemoglobin: 11.7 g/dL — ABNORMAL LOW (ref 12.0–15.0)
Immature Granulocytes: 0 %
Lymphocytes Relative: 18 %
Lymphs Abs: 1.4 10*3/uL (ref 0.7–4.0)
MCH: 29.8 pg (ref 26.0–34.0)
MCHC: 32.7 g/dL (ref 30.0–36.0)
MCV: 91.1 fL (ref 80.0–100.0)
Monocytes Absolute: 0.8 10*3/uL (ref 0.1–1.0)
Monocytes Relative: 10 %
Neutro Abs: 5.2 10*3/uL (ref 1.7–7.7)
Neutrophils Relative %: 68 %
Platelet Count: 245 10*3/uL (ref 150–400)
RBC: 3.93 MIL/uL (ref 3.87–5.11)
RDW: 14.2 % (ref 11.5–15.5)
WBC Count: 7.6 10*3/uL (ref 4.0–10.5)
nRBC: 0 % (ref 0.0–0.2)

## 2023-06-12 LAB — LACTATE DEHYDROGENASE: LDH: 159 U/L (ref 98–192)

## 2023-06-12 NOTE — Progress Notes (Signed)
Hematology and Oncology Follow Up Visit  [name] [id number] December 16, 1940 82 y.o. 06/12/2023   Principle Diagnosis:  Myxofibrosarcoma of the left popliteal fossa-stage IIIB (T2N0M0G3)  Current Therapy:   Radiation therapy-patient status post  38 treatments --completed on 04/30/2023     Interim History:  [name] is back for follow-up.  She is doing quite nicely.  Her main problem is that she does feel fatigued.  I am not sure as to why she would feel fatigued.  It might be from the radiation.  I know she is now about 6 weeks out from radiation therapy.  We did do a CT scan of her chest/abdomen/pelvis.  This was done on 06/03/2023.  This did not show any evidence of metastatic disease.  She is complaining of some tailbone issues.  I told her that she probably needs to see her spinal doctor.  She has had lower back surgery in the past.  She has finally healed up from the radiation behind the left knee.  She has some peeled skin.  However, this looks nicely healed.  She does have an area of Candida under the umbilicus.  I told her to apply some nystatin powder to this area.  She has had no fever.  There is some diarrhea.  She has had chronic diarrhea.  She has had no bleeding.  She has had no headache.  Overall, I would say performance status is probably ECOG 1.    Medications:  Current Outpatient Medications:    alendronate (FOSAMAX) 70 MG tablet, TAKE 1 TABLET (70 MG TOTAL) BY MOUTH EVERY 7 DAYS WITH FULL GLASS WATER ON EMPTY STOMACH, Disp: 12 tablet, Rfl: 3   allopurinol (ZYLOPRIM) 100 MG tablet, Take 2 tablets (200 mg total) by mouth daily., Disp: 180 tablet, Rfl: 3   amiodarone (PACERONE) 200 MG tablet, TAKE 400 MG (2 TABLETS) ONE DOSE AS NEED FOR A FAST HEART RATE ,IF AFTER 12 HOURS STILL PRESENT TAKE 200 MG ( 1 TABLET) ONE DOSE FOR EACH EPISODE, Disp: 180 tablet, Rfl: 1   apixaban (ELIQUIS) 5 MG TABS tablet, Take 1 tablet (5 mg total) by mouth 2 (two) times daily., Disp:  180 tablet, Rfl: 3   Artificial Tear Solution (TEARS RENEWED OP), Apply 1 drop to eye daily as needed (dry eyes)., Disp: , Rfl:    carvedilol (COREG) 6.25 MG tablet, TAKE 1 TABLET BY MOUTH TWICE DAILY WITH A MEAL. MAY TAKE AN EXTRA TABLET DAILY IF HAVING PALPATIONS (Patient taking differently: Take 6.25 mg by mouth 2 (two) times daily with a meal.), Disp: 225 tablet, Rfl: 3   cyanocobalamin (VITAMIN B12) 1000 MCG/ML injection, 1000 MCG INJECTION ONCE PER MONTH. PLEASE PROVIDE SYRINGES AS WELL, Disp: 3 mL, Rfl: 5   diclofenac sodium (VOLTAREN) 1 % GEL, Apply 2 g topically 4 (four) times daily as needed (pain)., Disp: , Rfl:    furosemide (LASIX) 80 MG tablet, Take 80 mg by mouth 2 (two) times daily., Disp: , Rfl:    HYDROcodone-acetaminophen (NORCO/VICODIN) 5-325 MG tablet, Take 1 tablet by mouth every 6 (six) hours as needed for moderate pain., Disp: 30 tablet, Rfl: 0   hyoscyamine (ANASPAZ) 0.125 MG TBDP disintergrating tablet, Place 1 tablet (0.125 mg total) under the tongue every 6 (six) hours as needed (abdominal cramping)., Disp: 30 tablet, Rfl: 5   indapamide (LOZOL) 2.5 MG tablet, TAKE ONE TABLET BY MOUTH DAILY, 30 MINUTES BEFORE DAILY FUROSEMIDE., Disp: 90 tablet, Rfl: 2   isosorbide mononitrate (IMDUR) 30 MG  24 hr tablet, Take 1 tablet (30 mg total) by mouth daily. May take an extra dose if you use Nitroglycerin  sublingual tablet for 2 days afterwards, Disp: 120 tablet, Rfl: 3   lidocaine (XYLOCAINE) 5 % ointment, Apply 1 application. topically as needed., Disp: 35.44 g, Rfl: 0   losartan (COZAAR) 50 MG tablet, TAKE 1 TABLET BY MOUTH EVERY DAY, Disp: 90 tablet, Rfl: 3   meclizine (ANTIVERT) 12.5 MG tablet, TAKE 1 TABLET BY MOUTH THREE TIMES A DAY AS NEEDED FOR DIZZINESS, Disp: 30 tablet, Rfl: 1   nitroGLYCERIN (NITROSTAT) 0.4 MG SL tablet, DISSOLVE 1 TABLET UNDER THE TONGUE EVERY 5 MINUTES AS NEEDED FOR CHEST PAIN, Disp: 25 tablet, Rfl: 6   spironolactone (ALDACTONE) 25 MG tablet, TAKE 1  TABLET BY MOUTH AS DIRECTED. TAKE 1 TABLET ON MONDAYS, WEDNESDAYS, AND FRIDAYS AT 6 PM., Disp: 45 tablet, Rfl: 3   colchicine 0.6 MG tablet, Take by mouth. (Patient not taking: Reported on 05/08/2023), Disp: , Rfl:   Allergies:  Allergies  Allergen Reactions   Codeine Phosphate Anaphylaxis, Shortness Of Breath and Swelling   Contrast Media [Iodinated Contrast Media] Swelling    Swelling of the face. Reaction was to ionic contrast many years ago. Patient has had non-ionic contrast many time without pre medication and has had no reaction.    Statins Other (See Comments)    Hyperactivity   Tramadol Palpitations and Other (See Comments)    Hyperactivity    Past Medical History, Surgical history, Social history, and Family History were reviewed and updated.  Review of Systems: Review of Systems  Constitutional: Negative.   HENT:  Negative.    Eyes: Negative.   Respiratory: Negative.    Cardiovascular: Negative.   Gastrointestinal: Negative.   Endocrine: Negative.   Genitourinary: Negative.    Musculoskeletal:  Positive for arthralgias.  Neurological: Negative.   Hematological: Negative.   Psychiatric/Behavioral: Negative.      Physical Exam:  height is 5\' 8"  (1.727 m) and weight is 196 lb 1.9 oz (89 kg). Her oral temperature is 97.9 F (36.6 C). Her blood pressure is 152/55 (abnormal) and her pulse is 83. Her respiration is 19 and oxygen saturation is 94%.   Wt Readings from Last 3 Encounters:  06/12/23 196 lb 1.9 oz (89 kg)  06/09/23 196 lb 6.4 oz (89.1 kg)  05/08/23 193 lb 1.3 oz (87.6 kg)    Physical Exam Vitals reviewed.  HENT:     Head: Normocephalic and atraumatic.  Eyes:     Pupils: Pupils are equal, round, and reactive to light.  Cardiovascular:     Rate and Rhythm: Normal rate and regular rhythm.     Heart sounds: Normal heart sounds.  Pulmonary:     Effort: Pulmonary effort is normal.     Breath sounds: Normal breath sounds.  Abdominal:     General: Bowel  sounds are normal.     Palpations: Abdomen is soft.  Musculoskeletal:        General: No tenderness or deformity. Normal range of motion.     Cervical back: Normal range of motion.     Comments: Behind the left knee is a surgical scar.  This is well-healed.  She has significant radiation dermatitis.  There is couple areas of open sores.  There is no obvious discharge.  She has a little bit of swelling.    Lymphadenopathy:     Cervical: No cervical adenopathy.  Skin:    General: Skin is warm and  dry.     Findings: No erythema or rash.  Neurological:     Mental Status: She is alert and oriented to person, place, and time.  Psychiatric:        Behavior: Behavior normal.        Thought Content: Thought content normal.        Judgment: Judgment normal.     Lab Results  Component Value Date   WBC 7.6 06/12/2023   HGB 11.7 (L) 06/12/2023   HCT 35.8 (L) 06/12/2023   MCV 91.1 06/12/2023   PLT 245 06/12/2023     Chemistry      Component Value Date/Time   NA 138 05/08/2023 0929   NA 136 04/29/2022 1230   K 4.0 05/08/2023 0929   CL 99 05/08/2023 0929   CO2 33 (H) 05/08/2023 0929   BUN 25 (H) 05/08/2023 0929   BUN 30 (H) 04/29/2022 1230   CREATININE 0.93 05/08/2023 0929   CREATININE 0.90 10/24/2016 1127      Component Value Date/Time   CALCIUM 9.5 05/08/2023 0929   ALKPHOS 74 05/08/2023 0929   AST 9 (L) 05/08/2023 0929   ALT 6 05/08/2023 0929   BILITOT 0.7 05/08/2023 0929       Impression and Plan: Ms. Dauphin is a very charming 82 year old white female.  She has a sarcoma behind the left knee in the popliteal fossa.  This is a fairly high-grade sarcoma.  As such, she had this resected.  Her margins were positive.  We have given her radiotherapy.  She completed this a week ago.  I will set her up with a CT scan in about a month or so.  By then it would be at least 2 months after radiation.  Hopefully, she will start feeling a little bit less fatigued.  Again I cannot find  anything that would suggest fatigue.  We will see about the CT scan before I see her back.  Josph Macho, MD 10/10/20249:42 AM

## 2023-06-12 NOTE — Progress Notes (Signed)
Patient will need post radiation CT.   Oncology Nurse Navigator Documentation     06/12/2023   12:00 PM  Oncology Nurse Navigator Flowsheets  Navigator Follow Up Date: 07/10/2023  Navigator Follow Up Reason: Scan Review  Navigator Location [facility]  Navigator Encounter Type Appt/Treatment Plan Review  Patient Visit Type MedOnc  Treatment Phase Post-Tx Follow-up  Barriers/Navigation Needs No Barriers At This Time  Interventions None Required  Acuity Level 1-No Barriers  Support Groups/Services Friends and Family  Time Spent with Patient 15

## 2023-06-16 ENCOUNTER — Other Ambulatory Visit: Payer: Self-pay

## 2023-06-16 DIAGNOSIS — C499 Malignant neoplasm of connective and soft tissue, unspecified: Secondary | ICD-10-CM

## 2023-06-16 MED ORDER — NYSTATIN 100000 UNIT/GM EX POWD
1.0000 | Freq: Three times a day (TID) | CUTANEOUS | 2 refills | Status: AC
Start: 2023-06-16 — End: ?

## 2023-06-17 ENCOUNTER — Ambulatory Visit: Payer: Medicare Other | Admitting: Cardiology

## 2023-06-23 ENCOUNTER — Other Ambulatory Visit: Payer: Self-pay | Admitting: Family Medicine

## 2023-06-23 DIAGNOSIS — H8110 Benign paroxysmal vertigo, unspecified ear: Secondary | ICD-10-CM

## 2023-06-25 DIAGNOSIS — C499 Malignant neoplasm of connective and soft tissue, unspecified: Secondary | ICD-10-CM | POA: Diagnosis not present

## 2023-06-25 DIAGNOSIS — Z96652 Presence of left artificial knee joint: Secondary | ICD-10-CM | POA: Diagnosis not present

## 2023-06-25 DIAGNOSIS — R229 Localized swelling, mass and lump, unspecified: Secondary | ICD-10-CM | POA: Diagnosis not present

## 2023-07-03 DIAGNOSIS — C499 Malignant neoplasm of connective and soft tissue, unspecified: Secondary | ICD-10-CM | POA: Diagnosis not present

## 2023-07-04 ENCOUNTER — Telehealth (HOSPITAL_BASED_OUTPATIENT_CLINIC_OR_DEPARTMENT_OTHER): Payer: Self-pay

## 2023-07-10 ENCOUNTER — Other Ambulatory Visit: Payer: Self-pay

## 2023-07-10 ENCOUNTER — Ambulatory Visit (HOSPITAL_BASED_OUTPATIENT_CLINIC_OR_DEPARTMENT_OTHER)
Admission: RE | Admit: 2023-07-10 | Discharge: 2023-07-10 | Disposition: A | Discharge status: Home / Self Care | Payer: Medicare Other | Source: Ambulatory Visit | Attending: Hematology & Oncology | Admitting: Hematology & Oncology

## 2023-07-10 DIAGNOSIS — C499 Malignant neoplasm of connective and soft tissue, unspecified: Secondary | ICD-10-CM

## 2023-07-10 MED ORDER — PREDNISONE 50 MG PO TABS: ORAL_TABLET | ORAL | 0 refills | Status: DC

## 2023-07-10 NOTE — Telephone Encounter (Signed)
Called and LM with patient regarding pre-medications prior to CT scan on 07/14/23.   Also sent via mychart.

## 2023-07-14 ENCOUNTER — Ambulatory Visit (HOSPITAL_BASED_OUTPATIENT_CLINIC_OR_DEPARTMENT_OTHER)
Admission: RE | Admit: 2023-07-14 | Discharge: 2023-07-14 | Disposition: A | Discharge status: Home / Self Care | Payer: Medicare Other | Source: Ambulatory Visit | Attending: Hematology & Oncology | Admitting: Hematology & Oncology

## 2023-07-14 DIAGNOSIS — C499 Malignant neoplasm of connective and soft tissue, unspecified: Secondary | ICD-10-CM | POA: Diagnosis not present

## 2023-07-14 DIAGNOSIS — C4922 Malignant neoplasm of connective and soft tissue of left lower limb, including hip: Secondary | ICD-10-CM | POA: Diagnosis not present

## 2023-07-14 DIAGNOSIS — R2242 Localized swelling, mass and lump, left lower limb: Secondary | ICD-10-CM | POA: Diagnosis not present

## 2023-07-14 MED ORDER — IOHEXOL 300 MG/ML  SOLN
100.0000 mL | Freq: Once | INTRAMUSCULAR | Status: AC | PRN
  Administered 2023-07-14: 100 mL via INTRAVENOUS

## 2023-07-17 ENCOUNTER — Other Ambulatory Visit: Payer: Self-pay | Admitting: Family Medicine

## 2023-07-17 MED ORDER — HYDROCODONE-ACETAMINOPHEN 5-325 MG PO TABS: 1.0000 | ORAL_TABLET | Freq: Four times a day (QID) | ORAL | 0 refills | Status: DC | PRN

## 2023-07-17 NOTE — Telephone Encounter (Signed)
Prescription Request  07/17/2023  LOV: 05/01/2023  What is the name of the medication or equipment? HYDROcodone-acetaminophen (NORCO/VICODIN) 5-325 MG tablet   Have you contacted your pharmacy to request a refill? Yes   Which pharmacy would you like this sent to?  [id number] - [place], [place] - [address] [place] [address] Phone: [phone] Fax: [phone]    Patient notified that their request is being sent to the clinical staff for review and that they should receive a response within 2 business days.   Please advise at Mobile [phone].

## 2023-07-22 DIAGNOSIS — M5414 Radiculopathy, thoracic region: Secondary | ICD-10-CM | POA: Diagnosis not present

## 2023-07-29 ENCOUNTER — Other Ambulatory Visit: Payer: Self-pay

## 2023-07-29 ENCOUNTER — Inpatient Hospital Stay (HOSPITAL_BASED_OUTPATIENT_CLINIC_OR_DEPARTMENT_OTHER): Payer: Medicare Other | Admitting: Hematology & Oncology

## 2023-07-29 ENCOUNTER — Inpatient Hospital Stay: Payer: Medicare Other | Attending: Hematology & Oncology

## 2023-07-29 ENCOUNTER — Encounter: Payer: Self-pay | Admitting: Hematology & Oncology

## 2023-07-29 VITALS — BP 152/41 | HR 76 | Temp 98.8°F | Resp 16 | Ht 68.0 in | Wt 195.0 lb

## 2023-07-29 DIAGNOSIS — C499 Malignant neoplasm of connective and soft tissue, unspecified: Secondary | ICD-10-CM | POA: Diagnosis not present

## 2023-07-29 DIAGNOSIS — Z79899 Other long term (current) drug therapy: Secondary | ICD-10-CM | POA: Diagnosis not present

## 2023-07-29 DIAGNOSIS — M25562 Pain in left knee: Secondary | ICD-10-CM | POA: Insufficient documentation

## 2023-07-29 DIAGNOSIS — C4922 Malignant neoplasm of connective and soft tissue of left lower limb, including hip: Secondary | ICD-10-CM | POA: Insufficient documentation

## 2023-07-29 DIAGNOSIS — F419 Anxiety disorder, unspecified: Secondary | ICD-10-CM

## 2023-07-29 LAB — CBC WITH DIFFERENTIAL (CANCER CENTER ONLY)
Abs Immature Granulocytes: 0.02 10*3/uL (ref 0.00–0.07)
Basophils Absolute: 0 10*3/uL (ref 0.0–0.1)
Basophils Relative: 1 %
Eosinophils Absolute: 0.1 10*3/uL (ref 0.0–0.5)
Eosinophils Relative: 2 %
HCT: 35.1 % — ABNORMAL LOW (ref 36.0–46.0)
Hemoglobin: 12 g/dL (ref 12.0–15.0)
Immature Granulocytes: 0 %
Lymphocytes Relative: 15 %
Lymphs Abs: 1.1 10*3/uL (ref 0.7–4.0)
MCH: 30.9 pg (ref 26.0–34.0)
MCHC: 34.2 g/dL (ref 30.0–36.0)
MCV: 90.5 fL (ref 80.0–100.0)
Monocytes Absolute: 0.6 10*3/uL (ref 0.1–1.0)
Monocytes Relative: 9 %
Neutro Abs: 5.4 10*3/uL (ref 1.7–7.7)
Neutrophils Relative %: 73 %
Platelet Count: 271 10*3/uL (ref 150–400)
RBC: 3.88 MIL/uL (ref 3.87–5.11)
RDW: 13.4 % (ref 11.5–15.5)
WBC Count: 7.3 10*3/uL (ref 4.0–10.5)
nRBC: 0 % (ref 0.0–0.2)

## 2023-07-29 LAB — CMP (CANCER CENTER ONLY)
ALT: 8 U/L (ref 0–44)
AST: 9 U/L — ABNORMAL LOW (ref 15–41)
Albumin: 4 g/dL (ref 3.5–5.0)
Alkaline Phosphatase: 80 U/L (ref 38–126)
Anion gap: 9 (ref 5–15)
BUN: 31 mg/dL — ABNORMAL HIGH (ref 8–23)
CO2: 32 mmol/L (ref 22–32)
Calcium: 9.5 mg/dL (ref 8.9–10.3)
Chloride: 94 mmol/L — ABNORMAL LOW (ref 98–111)
Creatinine: 1.09 mg/dL — ABNORMAL HIGH (ref 0.44–1.00)
GFR, Estimated: 51 mL/min — ABNORMAL LOW (ref >60)
Glucose, Bld: 151 mg/dL — ABNORMAL HIGH (ref 70–99)
Potassium: 3.4 mmol/L — ABNORMAL LOW (ref 3.5–5.1)
Sodium: 135 mmol/L (ref 135–145)
Total Bilirubin: 0.7 mg/dL (ref <1.2)
Total Protein: 6.9 g/dL (ref 6.5–8.1)

## 2023-07-29 LAB — FERRITIN: Ferritin: 58 ng/mL (ref 11–307)

## 2023-07-29 LAB — IRON AND IRON BINDING CAPACITY (CC-WL,HP ONLY)
Iron: 88 ug/dL (ref 28–170)
Saturation Ratios: 24 % (ref 10.4–31.8)
TIBC: 374 ug/dL (ref 250–450)
UIBC: 286 ug/dL (ref 148–442)

## 2023-07-29 LAB — TSH: TSH: 3.609 u[IU]/mL (ref 0.350–4.500)

## 2023-07-29 NOTE — Progress Notes (Signed)
Hematology and Oncology Follow Up Visit  [name] [id number] 1941/02/09 82 y.o. 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  [name] is doing quite nicely.  So far, there is has been doing pretty well.  [name] does have a little bit of pain with the left knee.  [name] completed all of [name] treatments with radiation probably about 2 months ago.  Unfortunately, the CT scan of [name] knee has not been read yet.  [name] has had no problems with cough or shortness of breath.  There has been no nausea or vomiting.  [name] has had no change in bowel or bladder habits.  There is been no fever.  [name] has had no bleeding.  Overall, I would say that [name] performance status is probably ECOG 1.   Medications:  Current Outpatient Medications:    alendronate (FOSAMAX) 70 MG tablet, TAKE 1 TABLET (70 MG TOTAL) BY MOUTH EVERY 7 DAYS WITH FULL GLASS WATER ON EMPTY STOMACH, Disp: 12 tablet, Rfl: 3   allopurinol (ZYLOPRIM) 100 MG tablet, Take 2 tablets (200 mg total) by mouth daily., Disp: 180 tablet, Rfl: 3   amiodarone (PACERONE) 200 MG tablet, TAKE 400 MG (2 TABLETS) ONE DOSE AS NEED FOR A FAST HEART RATE ,IF AFTER 12 HOURS STILL PRESENT TAKE 200 MG ( 1 TABLET) ONE DOSE FOR EACH EPISODE, Disp: 180 tablet, Rfl: 1   apixaban (ELIQUIS) 5 MG TABS tablet, Take 1 tablet (5 mg total) by mouth 2 (two) times daily., Disp: 180 tablet, Rfl: 3   Artificial Tear Solution (TEARS RENEWED OP), Apply 1 drop to eye daily as needed (dry eyes)., Disp: , Rfl:    carvedilol (COREG) 6.25 MG tablet, TAKE 1 TABLET BY MOUTH TWICE DAILY WITH A MEAL. MAY TAKE AN EXTRA TABLET DAILY IF HAVING PALPATIONS (Patient taking differently: Take 6.25 mg by mouth 2 (two) times daily with a meal.), Disp: 225 tablet, Rfl: 3   colchicine 0.6 MG tablet,  Take by mouth. (Patient not taking: Reported on 05/08/2023), Disp: , Rfl:    cyanocobalamin (VITAMIN B12) 1000 MCG/ML injection, 1000 MCG INJECTION ONCE PER MONTH. PLEASE PROVIDE SYRINGES AS WELL, Disp: 3 mL, Rfl: 5   diclofenac sodium (VOLTAREN) 1 % GEL, Apply 2 g topically 4 (four) times daily as needed (pain)., Disp: , Rfl:    furosemide (LASIX) 80 MG tablet, Take 80 mg by mouth 2 (two) times daily., Disp: , Rfl:    HYDROcodone-acetaminophen (NORCO/VICODIN) 5-325 MG tablet, Take 1 tablet by mouth every 6 (six) hours as needed for moderate pain (pain score 4-6)., Disp: 30 tablet, Rfl: 0   hyoscyamine (ANASPAZ) 0.125 MG TBDP disintergrating tablet, Place 1 tablet (0.125 mg total) under the tongue every 6 (six) hours as needed (abdominal cramping)., Disp: 30 tablet, Rfl: 5   indapamide (LOZOL) 2.5 MG tablet, TAKE ONE TABLET BY MOUTH DAILY, 30 MINUTES BEFORE DAILY FUROSEMIDE., Disp: 90 tablet, Rfl: 2   isosorbide mononitrate (IMDUR) 30 MG 24 hr tablet, Take 1 tablet (30 mg total) by mouth daily. May take an extra dose if you use Nitroglycerin  sublingual tablet for 2 days afterwards, Disp: 120 tablet, Rfl: 3   lidocaine (XYLOCAINE) 5 % ointment, Apply 1 application. topically as needed., Disp: 35.44 g, Rfl: 0   losartan (COZAAR) 50 MG tablet, TAKE 1  TABLET BY MOUTH EVERY DAY, Disp: 90 tablet, Rfl: 3   meclizine (ANTIVERT) 12.5 MG tablet, TAKE 1 TABLET BY MOUTH THREE TIMES A DAY AS NEEDED FOR DIZZINESS, Disp: 30 tablet, Rfl: 1   nitroGLYCERIN (NITROSTAT) 0.4 MG SL tablet, DISSOLVE 1 TABLET UNDER THE TONGUE EVERY 5 MINUTES AS NEEDED FOR CHEST PAIN, Disp: 25 tablet, Rfl: 6   nystatin (MYCOSTATIN/NYSTOP) powder, Apply 1 Application topically 3 (three) times daily., Disp: 30 g, Rfl: 2   predniSONE (DELTASONE) 50 MG tablet, Take 1 tablet (50mg ) by mouth: 13 hours prior to CT scan, 7 hours prior to CT scan and 1 hour prior to CT scan., Disp: 3 tablet, Rfl: 0   spironolactone (ALDACTONE) 25 MG tablet, TAKE 1  TABLET BY MOUTH AS DIRECTED. TAKE 1 TABLET ON MONDAYS, WEDNESDAYS, AND FRIDAYS AT 6 PM., Disp: 45 tablet, Rfl: 3  Allergies:  Allergies  Allergen Reactions   Codeine Phosphate Anaphylaxis, Shortness Of Breath and Swelling   Contrast Media [Iodinated Contrast Media] Swelling    Swelling of the face. Reaction was to ionic contrast many years ago. Patient has had non-ionic contrast many time without pre medication and has had no reaction.    Statins Other (See Comments)    Hyperactivity   Tramadol Palpitations and Other (See Comments)    Hyperactivity    Past Medical History, Surgical history, Social history, and Family History were reviewed and updated.  Review of Systems: Review of Systems  Constitutional: Negative.   HENT:  Negative.    Eyes: Negative.   Respiratory: Negative.    Cardiovascular: Negative.   Gastrointestinal: Negative.   Endocrine: Negative.   Genitourinary: Negative.    Musculoskeletal:  Positive for arthralgias.  Neurological: Negative.   Hematological: Negative.   Psychiatric/Behavioral: Negative.      Physical Exam:  height is 5\' 8"  (1.727 m) and weight is 195 lb (88.5 kg). [name] oral temperature is 98.8 F (37.1 C). [name] blood pressure is 152/41 (abnormal) and [name] pulse is 76. [name] respiration is 16 and oxygen saturation is 97%.   Wt Readings from Last 3 Encounters:  07/29/23 195 lb (88.5 kg)  06/12/23 196 lb 1.9 oz (89 kg)  06/09/23 196 lb 6.4 oz (89.1 kg)    Physical Exam Vitals reviewed.  HENT:     Head: Normocephalic and atraumatic.  Eyes:     Pupils: Pupils are equal, round, and reactive to light.  Cardiovascular:     Rate and Rhythm: Normal rate and regular rhythm.     Heart sounds: Normal heart sounds.  Pulmonary:     Effort: Pulmonary effort is normal.     Breath sounds: Normal breath sounds.  Abdominal:     General: Bowel sounds are normal.     Palpations: Abdomen is soft.  Musculoskeletal:        General: No tenderness or deformity.  Normal range of motion.     Cervical back: Normal range of motion.     Comments: Behind the left knee is a surgical scar.  This is well-healed.  [name] has significant radiation dermatitis.  There is couple areas of open sores.  There is no obvious discharge.  [name] has a little bit of swelling.    Lymphadenopathy:     Cervical: No cervical adenopathy.  Skin:    General: Skin is warm and dry.     Findings: No erythema or rash.  Neurological:     Mental Status: [name] is alert and oriented to person, place, and time.  Psychiatric:        Behavior: Behavior normal.        Thought Content: Thought content normal.        Judgment: Judgment normal.     Lab Results  Component Value Date   WBC 7.3 07/29/2023   HGB 12.0 07/29/2023   HCT 35.1 (L) 07/29/2023   MCV 90.5 07/29/2023   PLT 271 07/29/2023     Chemistry      Component Value Date/Time   NA 135 07/29/2023 0845   NA 136 04/29/2022 1230   K 3.4 (L) 07/29/2023 0845   CL 94 (L) 07/29/2023 0845   CO2 32 07/29/2023 0845   BUN 31 (H) 07/29/2023 0845   BUN 30 (H) 04/29/2022 1230   CREATININE 1.09 (H) 07/29/2023 0845   CREATININE 0.90 10/24/2016 1127      Component Value Date/Time   CALCIUM 9.5 07/29/2023 0845   ALKPHOS 80 07/29/2023 0845   AST 9 (L) 07/29/2023 0845   ALT 8 07/29/2023 0845   BILITOT 0.7 07/29/2023 0845       Impression and Plan: Ms. Cholico is a very charming 82 year old white female.  [name] has a sarcoma behind the left knee in the popliteal fossa.  This is a fairly high-grade sarcoma.  As such, [name] had this resected.  [name] margins were positive.  We have given [name] radiotherapy.  [name] completed this about 2 months ago.  We will have to see what the CT scan shows.  I would like to hope that [name] is in remission.  I know there is always at risk of the cancer coming back.  I probably would [name] back to see [place] in another 2 months or so.  We probably will need to have another CAT scan done for follow-up.   Josph Macho,  MD 11/26/20249:52 AM

## 2023-07-30 ENCOUNTER — Encounter: Payer: Self-pay | Admitting: *Deleted

## 2023-08-07 ENCOUNTER — Ambulatory Visit: Payer: Medicare Other | Admitting: Family Medicine

## 2023-08-07 ENCOUNTER — Encounter: Payer: Self-pay | Admitting: Family Medicine

## 2023-08-07 VITALS — BP 124/62 | HR 85 | Temp 98.0°F | Ht 68.0 in | Wt 196.8 lb

## 2023-08-07 DIAGNOSIS — G8929 Other chronic pain: Secondary | ICD-10-CM

## 2023-08-07 DIAGNOSIS — I2510 Atherosclerotic heart disease of native coronary artery without angina pectoris: Secondary | ICD-10-CM | POA: Diagnosis not present

## 2023-08-07 DIAGNOSIS — E1142 Type 2 diabetes mellitus with diabetic polyneuropathy: Secondary | ICD-10-CM | POA: Diagnosis not present

## 2023-08-07 DIAGNOSIS — E876 Hypokalemia: Secondary | ICD-10-CM | POA: Diagnosis not present

## 2023-08-07 DIAGNOSIS — Z9861 Coronary angioplasty status: Secondary | ICD-10-CM | POA: Diagnosis not present

## 2023-08-07 DIAGNOSIS — H8110 Benign paroxysmal vertigo, unspecified ear: Secondary | ICD-10-CM

## 2023-08-07 DIAGNOSIS — M25562 Pain in left knee: Secondary | ICD-10-CM | POA: Diagnosis not present

## 2023-08-07 DIAGNOSIS — E041 Nontoxic single thyroid nodule: Secondary | ICD-10-CM

## 2023-08-07 LAB — POCT GLYCOSYLATED HEMOGLOBIN (HGB A1C): Hemoglobin A1C: 6 % — AB (ref 4.0–5.6)

## 2023-08-07 MED ORDER — POTASSIUM CHLORIDE CRYS ER 10 MEQ PO TBCR: 10.0000 meq | EXTENDED_RELEASE_TABLET | Freq: Two times a day (BID) | ORAL | 0 refills | Status: DC | PRN

## 2023-08-07 MED ORDER — MECLIZINE HCL 12.5 MG PO TABS: 12.5000 mg | ORAL_TABLET | Freq: Two times a day (BID) | ORAL | 1 refills | Status: DC | PRN

## 2023-08-07 NOTE — Patient Instructions (Addendum)
  For next 5 days take potassium twice a day and then hold the other 20 pills. Then come back in 2 weeks for lab only visit since not free next week and do blood test to recheck  If you have any increasing frequency on the nitroglycerin- I want you to let cardiology know- I want this to improve not worsen  We have placed a referral for you today for thyroid US . In some cases you will see # listed below- you can call this if you have not heard within a week. If you do not see # listed- you should receive a mychart message or phone call within a week with the # to call directly- call that as soon as you get it. If you are having issues getting scheduled reach out to [place] again.   Recommended follow up: Return in about 14 weeks (around 11/13/2023) for followup or sooner if needed.Schedule b4 you leave.

## 2023-08-07 NOTE — Progress Notes (Signed)
Phone [phone] In person visit   Subjective:   [name] is a 82 y.o. year old very pleasant female patient who presents for/with See problem oriented charting Chief Complaint  Patient presents with   Medical Management of Chronic Issues   Diabetes    Has to r/s, sees podiatry next week that does diabetic foot exam (so she did not take off shoes/stockings)   Hypertension   finger cramping   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    Priority: High   Osteoporosis 01/22/2022    Priority: High   Pacemaker 07/09/2021    Priority: High   Mobitz type 2 second degree atrioventricular block 06/20/2021    Priority: High   PAT (paroxysmal atrial tachycardia) (HCC) 06/20/2021    Priority: High   PAF (paroxysmal atrial fibrillation) (HCC) 10/16/2020    Priority: High   PAD (peripheral artery disease) (HCC) 01/14/2017    Priority: High   Type 2 diabetes mellitus with peripheral neuropathy (HCC) 10/09/2016    Priority: High   Mesenteric artery stenosis (HCC) 04/20/2014    Priority: High   Abdominal pain, epigastric 01/19/2014    Priority: High   Congestive heart failure with LV diastolic dysfunction, NYHA class 2 (HCC)     Priority: High   Celiac artery stenosis: 60% by Duplex - 90-95% by cath - Med Rx 11/27/2013    Priority: High   Renal artery stenosis (HCC) 08/29/2012    Priority: High   Coronary artery disease involving native coronary artery of native heart with angina pectoris (HCC) 10/30/2011    Priority: High   CAD S/P percutaneous coronary angioplasty: pRCA BMS, mLAD BMS overlapped prox with DES for ISR 07/30/2010    Priority: High   B12 deficiency 01/10/2022    Priority: Medium    Continuous severe abdominal pain 10/11/2016    Priority: Medium    Exertional dyspnea 03/16/2015    Priority: Medium    Idiopathic chronic pancreatitis suspected 03/10/2014    Priority: Medium     Abnormal CT of liver-possible cirrhosis 03/10/2014    Priority: Medium    Stasis edema of bilateral lower extremity     Priority: Medium    Essential hypertension     Priority: Medium    Hyperlipidemia associated with type 2 diabetes mellitus (HCC)     Priority: Medium    Myalgia due to statin 12/29/2011    Priority: Medium    GERD (gastroesophageal reflux disease) 01/24/2009    Priority: Medium    Hypothyroid 11/12/2007    Priority: Medium    Irritable bowel syndrome 11/12/2007    Priority: Medium    PANCREAS DIVISUM 11/12/2007    Priority: Medium    Allergic rhinitis 01/10/2022    Priority: Low   Pneumothorax 07/10/2021    Priority: Low   Syncope and collapse 06/20/2021    Priority: Low   Hallux valgus (acquired), right foot 08/17/2018    Priority: Low   Costochondritis 01/21/2017    Priority: Low   Onychomycosis due to dermatophyte 11/16/2015    Priority: Low   Abnormal electrocardiogram during exercise stress test 03/15/2015    Priority: Low   Dysphagia 03/10/2014    Priority: Low   Long term current use of antithrombotics/antiplatelets - clopidogrel 03/10/2014    Priority: Low   Carotid stenosis 02/27/2014    Priority: Low   Leg cramps 01/19/2014    Priority: Low   Palpitations  01/12/2014    Priority: Low   Diarrhea due to malabsorption 01/24/2009    Priority: Low   Renal calculus 11/12/2007    Priority: Low   HIATAL HERNIA 11/20/2005    Priority: Low   Nodular fasciitis 12/20/2022    Medications- reviewed and updated Current Outpatient Medications  Medication Sig Dispense Refill   alendronate (FOSAMAX) 70 MG tablet TAKE 1 TABLET (70 MG TOTAL) BY MOUTH EVERY 7 DAYS WITH FULL GLASS WATER ON EMPTY STOMACH 12 tablet 3   allopurinol (ZYLOPRIM) 100 MG tablet Take 2 tablets (200 mg total) by mouth daily. 180 tablet 3   amiodarone (PACERONE) 200 MG tablet TAKE 400 MG (2 TABLETS) ONE DOSE AS NEED FOR A FAST HEART RATE ,IF AFTER 12 HOURS STILL PRESENT TAKE 200 MG (  1 TABLET) ONE DOSE FOR EACH EPISODE 180 tablet 1   apixaban (ELIQUIS) 5 MG TABS tablet Take 1 tablet (5 mg total) by mouth 2 (two) times daily. 180 tablet 3   Artificial Tear Solution (TEARS RENEWED OP) Apply 1 drop to eye daily as needed (dry eyes).     carvedilol (COREG) 6.25 MG tablet TAKE 1 TABLET BY MOUTH TWICE DAILY WITH A MEAL. MAY TAKE AN EXTRA TABLET DAILY IF HAVING PALPATIONS (Patient taking differently: Take 6.25 mg by mouth 2 (two) times daily with a meal.) 225 tablet 3   colchicine 0.6 MG tablet Take by mouth.     cyanocobalamin (VITAMIN B12) 1000 MCG/ML injection 1000 MCG INJECTION ONCE PER MONTH. PLEASE PROVIDE SYRINGES AS WELL 3 mL 5   diclofenac sodium (VOLTAREN) 1 % GEL Apply 2 g topically 4 (four) times daily as needed (pain).     furosemide (LASIX) 80 MG tablet Take 80 mg by mouth 2 (two) times daily.     hyoscyamine (ANASPAZ) 0.125 MG TBDP disintergrating tablet Place 1 tablet (0.125 mg total) under the tongue every 6 (six) hours as needed (abdominal cramping). 30 tablet 5   indapamide (LOZOL) 2.5 MG tablet TAKE ONE TABLET BY MOUTH DAILY, 30 MINUTES BEFORE DAILY FUROSEMIDE. 90 tablet 2   isosorbide mononitrate (IMDUR) 30 MG 24 hr tablet Take 1 tablet (30 mg total) by mouth daily. May take an extra dose if you use Nitroglycerin  sublingual tablet for 2 days afterwards 120 tablet 3   lidocaine (XYLOCAINE) 5 % ointment Apply 1 application. topically as needed. 35.44 g 0   losartan (COZAAR) 50 MG tablet TAKE 1 TABLET BY MOUTH EVERY DAY 90 tablet 3   nitroGLYCERIN (NITROSTAT) 0.4 MG SL tablet DISSOLVE 1 TABLET UNDER THE TONGUE EVERY 5 MINUTES AS NEEDED FOR CHEST PAIN 25 tablet 6   nystatin (MYCOSTATIN/NYSTOP) powder Apply 1 Application topically 3 (three) times daily. 30 g 2   potassium chloride (KLOR-CON M) 10 MEQ tablet Take 1 tablet (10 mEq total) by mouth 2 (two) times daily as needed (as instructed per Dr. Durene Cal). 30 tablet 0   spironolactone (ALDACTONE) 25 MG tablet TAKE 1  TABLET BY MOUTH AS DIRECTED. TAKE 1 TABLET ON MONDAYS, WEDNESDAYS, AND FRIDAYS AT 6 PM. 45 tablet 3   HYDROcodone-acetaminophen (NORCO/VICODIN) 5-325 MG tablet Take 1 tablet by mouth every 6 (six) hours as needed for moderate pain (pain score 4-6). (Patient not taking: Reported on 08/07/2023) 30 tablet 0   meclizine (ANTIVERT) 12.5 MG tablet Take 1 tablet (12.5 mg total) by mouth 2 (two) times daily as needed for dizziness. 60 tablet 1   No current facility-administered medications for this visit.  Objective:  BP 124/62   Pulse 85   Temp 98 F (36.7 C)   Ht 5\' 8"  (1.727 m)   Wt 196 lb 12.8 oz (89.3 kg)   SpO2 96%   BMI 29.92 kg/m  Gen: NAD, resting comfortably CV: RRR no murmurs rubs or gallops Lungs: CTAB no crackles, wheeze, rhonchi Ext: no edema Skin: warm, dry     Assessment and Plan   # Primary myxofibrosarcoma-just had follow-up with Dr. Myna Hidalgo on 04/09/2023 -Prior resection with margins positive and had radiotherapy.  Unfortunately high-grade.  - recent CT with 1.9x1.1 x 1.3 cm area - plan at this point is for MRI. Previously completed radiation therapy after resection.   #Knee pain on left related to this and some ongoing vertigo episodes- needs updated cane and wants refill on meclizine - this sounds more orthostatic but since she gets benefit from medicine will refill- has had higher blood pressure in past and thankful controlled today  #Chronic abdominal pain-see listing under epigastric abdominal pain- this has continued to bother her- on the hydrocodone still and finds helpful- also takes tylenol and uses pain patches- lidocaine   #CAD with angina- on imdur- despite PCI x2 (last 2011) #hyperlipidemia but with myalgias related to statins #Mobitz type 2 second degree AV block s/p pacemaker November 2022 S: Medication: imdur 30 mg, coreg 6.25 mg BID, on eliquis 5 mg  BID  - in last few weeks has taken nitroglycerin twice and that's atypical of rher - no cholesterol  treatment as of 2023-statin intolerant and failed other lipid management - has not tried pcsk9 inhibitors  A/P: coronary artery disease with angina but slightly worse lately with 2 cardiology in last 2 weeks- if worsens or fails to improve I want her to see cardiology- for now continue current medications  Does have high cholesterol but declines medicine  # Atrial fibrillation- discovered by pacemaker interrogation (very symptomatic) S: Rate controlled on coreg 6.25 mg BID (extra tablet with palpitations) - on amiodarone 400 mg with fast heart rate- hasn't needed-has had a few extra beats lately and we wonder if related to the low potassium Anticoagulated with eliquis 5 mg BID A/P: appropriately anticoagulated with eliquis and rate controlled with coreg- continue current medicine  - as noted above try to correct potassium  # Diabetes S: Medication:Diet controlled Lab Results  Component Value Date   HGBA1C 6.1 (A) 05/01/2023   HGBA1C 6.4 12/27/2022   HGBA1C 6.5 10/17/2022  A/P:  hopefully stable- update a1c today- came back at 6 on Point of Care (POC)- suspect at most 6.5 so well controlled . Continue without meds for now    #hypertension S: Medication:  lasix 80 mg (BID if needed for edema) daily lately, indapaminde 2.5 mg, imdur 30 mg, losartan 50 mg, spironolactone 25 mg, carvedilol 6.25 mg twice daily BP Readings from Last 3 Encounters:  08/07/23 124/62  07/29/23 (!) 152/41  06/12/23 (!) 152/55  A/P: blood pressure well controlled continue current medications Known CHF- weight stable and edema stable- appears euvolemic  #urinary frequency- checking urine culture today- had been ordered last visit  #Thyroid right nodule - given option of yearly or every other year- prefers yearly- ordered today- they stated follow up for 5 years- this will be end of 1st year   Recommended follow up: Return in about 14 weeks (around 11/13/2023) for followup or sooner if needed.Schedule b4 you  leave. Future Appointments  Date Time Provider Department Center  09/11/2023 11:00 AM Antony Blackbird,  MD [facility] None  09/29/2023  9:30 AM [facility] None  09/29/2023  9:45 AM Ennever, Rose Phi, MD [facility] None  10/10/2023  7:00 AM [facility] DEVICE REMOTES [facility]  10/14/2023  9:20 AM Marykay Lex, MD [facility] None  01/09/2024  7:00 AM [facility] DEVICE REMOTES [facility]    Lab/Order associations:   ICD-10-CM   1. Type 2 diabetes mellitus with peripheral neuropathy (HCC)  E11.42 POCT HgB A1C    2. Hypokalemia  E87.6 Basic Metabolic Panel (BMET)    3. Chronic pain of left knee  M25.562    G89.29     4. Benign paroxysmal positional vertigo, unspecified laterality  H81.10 meclizine (ANTIVERT) 12.5 MG tablet    5. CAD S/P percutaneous coronary angioplasty: pRCA BMS, mLAD BMS overlapped prox with DES for ISR  I25.10    Z98.61     6. Thyroid nodule  E04.1       Meds ordered this encounter  Medications   potassium chloride (KLOR-CON M) 10 MEQ tablet    Sig: Take 1 tablet (10 mEq total) by mouth 2 (two) times daily as needed (as instructed per Dr. Durene Cal).    Dispense:  30 tablet    Refill:  0   meclizine (ANTIVERT) 12.5 MG tablet    Sig: Take 1 tablet (12.5 mg total) by mouth 2 (two) times daily as needed for dizziness.    Dispense:  60 tablet    Refill:  1    Return precautions advised.  Tana Conch, MD

## 2023-08-11 ENCOUNTER — Other Ambulatory Visit: Payer: Self-pay | Admitting: Hematology & Oncology

## 2023-08-11 DIAGNOSIS — C499 Malignant neoplasm of connective and soft tissue, unspecified: Secondary | ICD-10-CM

## 2023-08-14 ENCOUNTER — Ambulatory Visit
Admission: RE | Admit: 2023-08-14 | Discharge: 2023-08-14 | Disposition: A | Discharge status: Home / Self Care | Payer: Medicare Other | Source: Ambulatory Visit | Attending: Family Medicine | Admitting: Family Medicine

## 2023-08-14 DIAGNOSIS — E041 Nontoxic single thyroid nodule: Secondary | ICD-10-CM

## 2023-08-14 DIAGNOSIS — E042 Nontoxic multinodular goiter: Secondary | ICD-10-CM | POA: Diagnosis not present

## 2023-08-15 NOTE — Progress Notes (Signed)
Reviewed CT scan with Dr Myna Hidalgo which shows an area of concern. Dr Myna Hidalgo will order an MRI to better characterize the abnormality.   Oncology Nurse Navigator Documentation     07/30/2023   11:30 AM  Oncology Nurse Navigator Flowsheets  Navigator Follow Up Date: 09/23/2023  Navigator Follow Up Reason: Scan Review  Navigator Location [facility]  Navigator Encounter Type Scan Review  Patient Visit Type MedOnc  Treatment Phase Post-Tx Follow-up  Barriers/Navigation Needs No Barriers At This Time  Interventions Coordination of Care  Acuity Level 1-No Barriers  Coordination of Care Other  Support Groups/Services Friends and Family  Time Spent with Patient 15

## 2023-08-18 ENCOUNTER — Other Ambulatory Visit: Payer: Self-pay

## 2023-08-18 MED ORDER — DIPHENHYDRAMINE HCL 50 MG PO TABS: ORAL_TABLET | ORAL | 0 refills | Status: DC

## 2023-08-18 MED ORDER — PREDNISONE 50 MG PO TABS: ORAL_TABLET | ORAL | 0 refills | Status: DC

## 2023-08-21 ENCOUNTER — Other Ambulatory Visit (INDEPENDENT_AMBULATORY_CARE_PROVIDER_SITE_OTHER): Payer: Medicare Other

## 2023-08-21 DIAGNOSIS — E876 Hypokalemia: Secondary | ICD-10-CM

## 2023-08-22 LAB — BASIC METABOLIC PANEL
BUN: 32 mg/dL — ABNORMAL HIGH (ref 6–23)
CO2: 29 meq/L (ref 19–32)
Calcium: 9.5 mg/dL (ref 8.4–10.5)
Chloride: 101 meq/L (ref 96–112)
Creatinine, Ser: 0.9 mg/dL (ref 0.40–1.20)
GFR: 60 mL/min — ABNORMAL LOW (ref >60.00)
Glucose, Bld: 119 mg/dL — ABNORMAL HIGH (ref 70–99)
Potassium: 4 meq/L (ref 3.5–5.1)
Sodium: 139 meq/L (ref 135–145)

## 2023-08-25 DIAGNOSIS — M2042 Other hammer toe(s) (acquired), left foot: Secondary | ICD-10-CM | POA: Diagnosis not present

## 2023-08-25 DIAGNOSIS — L84 Corns and callosities: Secondary | ICD-10-CM | POA: Diagnosis not present

## 2023-08-25 DIAGNOSIS — B351 Tinea unguium: Secondary | ICD-10-CM | POA: Diagnosis not present

## 2023-08-25 DIAGNOSIS — I739 Peripheral vascular disease, unspecified: Secondary | ICD-10-CM | POA: Diagnosis not present

## 2023-08-25 DIAGNOSIS — M2041 Other hammer toe(s) (acquired), right foot: Secondary | ICD-10-CM | POA: Diagnosis not present

## 2023-08-29 ENCOUNTER — Other Ambulatory Visit: Payer: Self-pay | Admitting: Cardiology

## 2023-09-02 ENCOUNTER — Other Ambulatory Visit: Payer: Self-pay | Admitting: Family Medicine

## 2023-09-05 ENCOUNTER — Telehealth: Payer: Self-pay | Admitting: *Deleted

## 2023-09-05 NOTE — Telephone Encounter (Signed)
 RETURNED PATIENT'S PHONE CALL, SPOKE WITH PATIENT. ?

## 2023-09-11 ENCOUNTER — Ambulatory Visit: Payer: Self-pay | Admitting: Radiation Oncology

## 2023-09-15 ENCOUNTER — Encounter: Payer: Self-pay | Admitting: Radiation Oncology

## 2023-09-15 ENCOUNTER — Ambulatory Visit
Admission: RE | Admit: 2023-09-15 | Discharge: 2023-09-15 | Disposition: A | Discharge status: Home / Self Care | Payer: Medicare Other | Source: Ambulatory Visit | Attending: Radiation Oncology | Admitting: Radiation Oncology

## 2023-09-15 VITALS — BP 152/69 | HR 83 | Temp 97.5°F | Resp 18 | Ht 68.0 in

## 2023-09-15 DIAGNOSIS — C4922 Malignant neoplasm of connective and soft tissue of left lower limb, including hip: Secondary | ICD-10-CM

## 2023-09-15 DIAGNOSIS — C499 Malignant neoplasm of connective and soft tissue, unspecified: Secondary | ICD-10-CM

## 2023-09-15 NOTE — Progress Notes (Signed)
 [name] is here today for follow up post radiation to left knee.  They completed their radiation on: 04/30/2023  Does the patient complain of any of the following:  Pain: She reports that her leg hurts a lot a night. 8/10 Swelling: Yes  Post radiation skin changes: Denies    BP (!) 152/69 (BP Location: Left Arm, Patient Position: Sitting)   Pulse 83   Temp (!) 97.5 F (36.4 C) (Temporal)   Resp 18   Ht 5' 8 (1.727 m)   SpO2 96%   BMI 29.92 kg/m

## 2023-09-15 NOTE — Progress Notes (Signed)
 Radiation Oncology         [id number]) [phone] ________________________________  Name: [name] MRN: [id number]  Date: 09/15/2023  DOB: 06/24/1941  Follow-Up Visit Note  CC: Tricia Garnette KIDD, MD  Timmy Maude SAUNDERS, MD    ICD-10-CM   1. 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 A diagnosis of Primary myxofibrosarcoma (HCC) was also pertinent to this visit.   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 She was last seen here for follow-up on 06/09/23 and has continued to follow with Dr. Timmy since that time.   Her most recent CT scan of the left knee with contrast on 07/14/23 showed: a nonspecific focal site of nodularity within the superficial subcutaneous tissues along the inferior margin of the surgical resection site measuring approximately 1.9 x 1.1 x 1.3 cm, possibly reflecting post surgical changes, however residual/recurrent disease cannot be entirely excluded. A small fluid collection within the resection bed measuring approximately 3.1 x 0.8 x 2.8 cm was also demonstrated, likely reflecting a postoperative seroma. CT otherwise showed expected post-surgical changes s/p soft tissue mass resection at the posterior aspect of the left knee and left total knee arthroplasty.  Upon record review, she also had an MRI of the left knee performed at [facility] on 06/25/23 which showed stable findings overall and no evidence  of local disease recurrent. The small postsurgical fluid collections within the surgical bed were also demonstrated, and appeared to be without suspicious enhancement.   No other significant oncologic interval history since the patient was last seen for follow-up.   Patient is still experiencing right sided leg pain, that is worse at night. Her lower extremities remain swollen. She denies any cough or shortness of breath new from her baseline.    Allergies:  is allergic to codeine phosphate, contrast media [iodinated contrast media], statins, and tramadol.  Meds: Current Outpatient Medications  Medication Sig Dispense Refill   alendronate  (FOSAMAX ) 70 MG tablet TAKE 1 TABLET (70 MG TOTAL) BY MOUTH EVERY 7 DAYS WITH FULL GLASS WATER ON EMPTY STOMACH 12 tablet 3   allopurinol  (ZYLOPRIM ) 100 MG tablet Take 2 tablets (200 mg total) by mouth daily. 180 tablet 3   amiodarone  (PACERONE ) 200 MG tablet TAKE 400 MG (2 TABLETS) ONE DOSE AS NEED FOR A FAST HEART RATE ,IF AFTER 12 HOURS STILL PRESENT TAKE 200 MG ( 1 TABLET) ONE DOSE FOR EACH EPISODE 180 tablet 1   apixaban  (ELIQUIS ) 5 MG TABS tablet Take 1 tablet (5 mg total) by mouth 2 (two) times daily. 180 tablet 3   Artificial Tear Solution (TEARS RENEWED OP) Apply 1 drop to eye daily as needed (dry eyes).     carvedilol  (COREG ) 6.25 MG tablet TAKE 1 TABLET BY MOUTH TWICE DAILY WITH A MEAL. MAY TAKE AN EXTRA TABLET DAILY IF HAVING PALPATIONS (Patient taking differently: Take 6.25 mg by mouth 2 (two) times daily with a meal.) 225 tablet 3  colchicine 0.6 MG tablet Take by mouth.     cyanocobalamin  (VITAMIN B12) 1000 MCG/ML injection 1000 MCG INJECTION ONCE PER MONTH. PLEASE PROVIDE SYRINGES AS WELL 3 mL 5   diclofenac sodium (VOLTAREN) 1 % GEL Apply 2 g topically 4 (four) times daily as needed (pain).     diphenhydrAMINE  (BENADRYL ) 50 MG tablet Please take 50 mg of benadryl  one hour before contrast injection. 1 tablet 0   furosemide  (LASIX ) 80 MG tablet  Take 80 mg by mouth 2 (two) times daily.     HYDROcodone -acetaminophen  (NORCO/VICODIN) 5-325 MG tablet Take 1 tablet by mouth every 6 (six) hours as needed for moderate pain (pain score 4-6). 30 tablet 0   indapamide  (LOZOL ) 2.5 MG tablet TAKE ONE TABLET BY MOUTH DAILY, 30 MINUTES BEFORE DAILY FUROSEMIDE . 90 tablet 1   isosorbide  mononitrate (IMDUR ) 30 MG 24 hr tablet Take 1 tablet (30 mg total) by mouth daily. May take an extra dose if you use Nitroglycerin   sublingual tablet for 2 days afterwards 120 tablet 3   lidocaine  (XYLOCAINE ) 5 % ointment Apply 1 application. topically as needed. 35.44 g 0   losartan  (COZAAR ) 50 MG tablet TAKE 1 TABLET BY MOUTH EVERY DAY 90 tablet 3   meclizine  (ANTIVERT ) 12.5 MG tablet Take 1 tablet (12.5 mg total) by mouth 2 (two) times daily as needed for dizziness. 60 tablet 1   nitroGLYCERIN  (NITROSTAT ) 0.4 MG SL tablet DISSOLVE 1 TABLET UNDER THE TONGUE EVERY 5 MINUTES AS NEEDED FOR CHEST PAIN 25 tablet 6   nystatin  (MYCOSTATIN /NYSTOP ) powder Apply 1 Application topically 3 (three) times daily. 30 g 2   predniSONE  (DELTASONE ) 50 MG tablet Please take 50 mg by mouth at 13 hours, 7 hours and 1 hour before contrast media injection. 3 tablet 0   spironolactone  (ALDACTONE ) 25 MG tablet TAKE 1 TABLET BY MOUTH AS DIRECTED. TAKE 1 TABLET ON MONDAYS, WEDNESDAYS, AND FRIDAYS AT 6 PM. 45 tablet 3   hyoscyamine  (ANASPAZ ) 0.125 MG TBDP disintergrating tablet Place 1 tablet (0.125 mg total) under the tongue every 6 (six) hours as needed (abdominal cramping). (Patient not taking: Reported on 09/15/2023) 30 tablet 5   potassium chloride  (KLOR-CON  M) 10 MEQ tablet TAKE 1 TABLET (10 MEQ TOTAL) BY MOUTH 2 (TWO) TIMES DAILY AS NEEDED (AS INSTRUCTED PER DR. HUNTER). (Patient not taking: Reported on 09/15/2023) 90 tablet 1   No current facility-administered medications for this encounter.    Physical Findings: The patient is in no acute distress. Patient is alert and oriented.  height is  5' 8 (1.727 m). Her temporal temperature is 97.5 F (36.4 C) (abnormal). Her blood pressure is 152/69 (abnormal) and her pulse is 83. Her respiration is 18 and oxygen saturation is 96%. .  No significant changes. Lungs are clear to auscultation bilaterally. Heart has regular rate and rhythm. No palpable cervical, supraclavicular, or axillary adenopathy. Abdomen soft, non-tender, normal bowel sounds.  Well healed surgical scar in the left popliteal fossa. Hyperpigmentation in within the treatment field.  Mild telangiectasias in the treatment area.  Some nodularity along the surgical scar likely scar tissue.  Moderate bilateral lower extremity lymphedema.    Lab Findings: Lab Results  Component Value Date   WBC 7.3 07/29/2023   HGB 12.0 07/29/2023   HCT 35.1 (L) 07/29/2023   MCV 90.5 07/29/2023   PLT 271 07/29/2023    Radiographic Findings: No results found.  Impression: The primary encounter diagnosis was Sarcoma of lower extremity, left (HCC). A diagnosis of Primary  myxofibrosarcoma (HCC) was also pertinent to this visit.   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 We reviewed her most recent MRI, which appears to be reassuring although there was some artifact. No evidence of disease recurrence on clinical exam today.  Plan:  Patient is continuing follow-up under the care of Dr. Timmy. She is scheduled for a follow-up MRI of the knee on 09/23/2023 and an appointment with Dr. Timmy scheduled for 09/29/2023 to review the scan.   Given her risk of recurrence, we will continue radiation follow-up. Radiation follow-up in 3 months.   20 minutes of total time was spent for this patient encounter, including preparation, face-to-face counseling with the patient and coordination of care, physical exam, and documentation of the  encounter. ____________________________________   Leeroy Due, PA-C   Lynwood CHARM Nasuti, PhD, MD   [facility]  Radiation Oncology Direct Dial: [phone]  Fax: [phone] [email or web address]    This document serves as a record of services personally performed by Lynwood Nasuti, MD and Leeroy Due, PA-C. It was created on his behalf by Dorthy Fuse, a trained medical scribe. The creation of this record is based on the scribe's personal observations and the provider's statements to them. This document has been checked and approved by the attending provider.

## 2023-09-17 ENCOUNTER — Telehealth: Payer: Self-pay | Admitting: Cardiology

## 2023-09-17 DIAGNOSIS — I4719 Other supraventricular tachycardia: Secondary | ICD-10-CM

## 2023-09-17 DIAGNOSIS — I441 Atrioventricular block, second degree: Secondary | ICD-10-CM

## 2023-09-17 DIAGNOSIS — I25118 Atherosclerotic heart disease of native coronary artery with other forms of angina pectoris: Secondary | ICD-10-CM

## 2023-09-17 NOTE — Telephone Encounter (Signed)
*  STAT* If patient is at the pharmacy, call can be transferred to refill team.   1. Which medications need to be refilled? (please list name of each medication and dose if known)   apixaban  (ELIQUIS ) 5 MG TABS tablet    2. Would you like to learn more about the convenience, safety, & potential cost savings by using the [facility]?   3. Are you open to using the [facility] (Type [facility]. ).   4. Which pharmacy/location (including street and city if local pharmacy) is medication to be sent to?    Email to husband at [email or web address] as he sends the prescription to a [place] pharmacy.  5. Do they need a 30 day or 90 day supply?   90 day  Patient stated she has a little medication left.

## 2023-09-19 MED ORDER — APIXABAN 5 MG PO TABS: 5.0000 mg | ORAL_TABLET | Freq: Two times a day (BID) | ORAL | 3 refills | Status: DC

## 2023-09-19 NOTE — Telephone Encounter (Signed)
Late entry  from 09/18/23 Patient called  earlier today   States  patient needs an written prescription for Eliquis . They obtain there medication from [place] patient  states they will pick up Rx on Tuesday 09/23/23  RN informed wife that would be okay . Dr Royann Shivers would in the office to sign prescription

## 2023-09-23 ENCOUNTER — Ambulatory Visit (HOSPITAL_COMMUNITY)
Admission: RE | Admit: 2023-09-23 | Discharge: 2023-09-23 | Disposition: A | Discharge status: Home / Self Care | Payer: Medicare Other | Source: Ambulatory Visit | Attending: Hematology & Oncology | Admitting: Hematology & Oncology

## 2023-09-23 DIAGNOSIS — R229 Localized swelling, mass and lump, unspecified: Secondary | ICD-10-CM | POA: Diagnosis not present

## 2023-09-23 DIAGNOSIS — Z85831 Personal history of malignant neoplasm of soft tissue: Secondary | ICD-10-CM | POA: Diagnosis not present

## 2023-09-23 DIAGNOSIS — C499 Malignant neoplasm of connective and soft tissue, unspecified: Secondary | ICD-10-CM | POA: Diagnosis not present

## 2023-09-23 DIAGNOSIS — Z471 Aftercare following joint replacement surgery: Secondary | ICD-10-CM | POA: Diagnosis not present

## 2023-09-23 MED ORDER — GADOBUTROL 1 MMOL/ML IV SOLN
10.0000 mL | Freq: Once | INTRAVENOUS | Status: AC | PRN
  Administered 2023-09-23: 10 mL via INTRAVENOUS

## 2023-09-26 ENCOUNTER — Other Ambulatory Visit: Payer: Self-pay | Admitting: *Deleted

## 2023-09-26 DIAGNOSIS — E039 Hypothyroidism, unspecified: Secondary | ICD-10-CM

## 2023-09-26 DIAGNOSIS — L02416 Cutaneous abscess of left lower limb: Secondary | ICD-10-CM

## 2023-09-26 DIAGNOSIS — C499 Malignant neoplasm of connective and soft tissue, unspecified: Secondary | ICD-10-CM

## 2023-09-26 DIAGNOSIS — C481 Malignant neoplasm of specified parts of peritoneum: Secondary | ICD-10-CM

## 2023-09-29 ENCOUNTER — Ambulatory Visit (INDEPENDENT_AMBULATORY_CARE_PROVIDER_SITE_OTHER): Payer: Medicare Other | Admitting: Cardiology

## 2023-09-29 ENCOUNTER — Encounter: Payer: Self-pay | Admitting: Hematology & Oncology

## 2023-09-29 ENCOUNTER — Encounter: Payer: Self-pay | Admitting: *Deleted

## 2023-09-29 ENCOUNTER — Inpatient Hospital Stay: Payer: Medicare Other | Attending: Hematology & Oncology

## 2023-09-29 ENCOUNTER — Inpatient Hospital Stay (HOSPITAL_BASED_OUTPATIENT_CLINIC_OR_DEPARTMENT_OTHER): Payer: Medicare Other | Admitting: Hematology & Oncology

## 2023-09-29 ENCOUNTER — Other Ambulatory Visit: Payer: Self-pay

## 2023-09-29 VITALS — BP 168/68 | HR 85 | Ht 68.0 in | Wt 200.0 lb

## 2023-09-29 VITALS — BP 184/48 | HR 78 | Temp 98.4°F | Resp 18 | Ht 68.0 in | Wt 199.0 lb

## 2023-09-29 DIAGNOSIS — C4922 Malignant neoplasm of connective and soft tissue of left lower limb, including hip: Secondary | ICD-10-CM | POA: Diagnosis not present

## 2023-09-29 DIAGNOSIS — I5030 Unspecified diastolic (congestive) heart failure: Secondary | ICD-10-CM | POA: Insufficient documentation

## 2023-09-29 DIAGNOSIS — E1169 Type 2 diabetes mellitus with other specified complication: Secondary | ICD-10-CM | POA: Insufficient documentation

## 2023-09-29 DIAGNOSIS — Z79899 Other long term (current) drug therapy: Secondary | ICD-10-CM | POA: Insufficient documentation

## 2023-09-29 DIAGNOSIS — Z950 Presence of cardiac pacemaker: Secondary | ICD-10-CM | POA: Insufficient documentation

## 2023-09-29 DIAGNOSIS — I480 Paroxysmal atrial fibrillation: Secondary | ICD-10-CM | POA: Diagnosis not present

## 2023-09-29 DIAGNOSIS — I10 Essential (primary) hypertension: Secondary | ICD-10-CM | POA: Diagnosis not present

## 2023-09-29 DIAGNOSIS — I4719 Other supraventricular tachycardia: Secondary | ICD-10-CM | POA: Insufficient documentation

## 2023-09-29 DIAGNOSIS — I87302 Chronic venous hypertension (idiopathic) without complications of left lower extremity: Secondary | ICD-10-CM

## 2023-09-29 DIAGNOSIS — E785 Hyperlipidemia, unspecified: Secondary | ICD-10-CM | POA: Insufficient documentation

## 2023-09-29 DIAGNOSIS — M791 Myalgia, unspecified site: Secondary | ICD-10-CM | POA: Insufficient documentation

## 2023-09-29 DIAGNOSIS — I25119 Atherosclerotic heart disease of native coronary artery with unspecified angina pectoris: Secondary | ICD-10-CM

## 2023-09-29 DIAGNOSIS — T466X5A Adverse effect of antihyperlipidemic and antiarteriosclerotic drugs, initial encounter: Secondary | ICD-10-CM

## 2023-09-29 DIAGNOSIS — T466X5D Adverse effect of antihyperlipidemic and antiarteriosclerotic drugs, subsequent encounter: Secondary | ICD-10-CM

## 2023-09-29 DIAGNOSIS — R0609 Other forms of dyspnea: Secondary | ICD-10-CM

## 2023-09-29 DIAGNOSIS — M7989 Other specified soft tissue disorders: Secondary | ICD-10-CM | POA: Diagnosis not present

## 2023-09-29 DIAGNOSIS — I2510 Atherosclerotic heart disease of native coronary artery without angina pectoris: Secondary | ICD-10-CM

## 2023-09-29 DIAGNOSIS — C499 Malignant neoplasm of connective and soft tissue, unspecified: Secondary | ICD-10-CM

## 2023-09-29 DIAGNOSIS — E039 Hypothyroidism, unspecified: Secondary | ICD-10-CM

## 2023-09-29 DIAGNOSIS — D6869 Other thrombophilia: Secondary | ICD-10-CM | POA: Insufficient documentation

## 2023-09-29 DIAGNOSIS — M940 Chondrocostal junction syndrome [Tietze]: Secondary | ICD-10-CM | POA: Insufficient documentation

## 2023-09-29 DIAGNOSIS — L02416 Cutaneous abscess of left lower limb: Secondary | ICD-10-CM

## 2023-09-29 DIAGNOSIS — C481 Malignant neoplasm of specified parts of peritoneum: Secondary | ICD-10-CM

## 2023-09-29 DIAGNOSIS — C786 Secondary malignant neoplasm of retroperitoneum and peritoneum: Secondary | ICD-10-CM

## 2023-09-29 LAB — CMP (CANCER CENTER ONLY)
ALT: 8 U/L (ref 0–44)
AST: 9 U/L — ABNORMAL LOW (ref 15–41)
Albumin: 4.2 g/dL (ref 3.5–5.0)
Alkaline Phosphatase: 67 U/L (ref 38–126)
Anion gap: 7 (ref 5–15)
BUN: 31 mg/dL — ABNORMAL HIGH (ref 8–23)
CO2: 29 mmol/L (ref 22–32)
Calcium: 9.5 mg/dL (ref 8.9–10.3)
Chloride: 102 mmol/L (ref 98–111)
Creatinine: 0.96 mg/dL (ref 0.44–1.00)
GFR, Estimated: 59 mL/min — ABNORMAL LOW (ref >60)
Glucose, Bld: 119 mg/dL — ABNORMAL HIGH (ref 70–99)
Potassium: 4.5 mmol/L (ref 3.5–5.1)
Sodium: 138 mmol/L (ref 135–145)
Total Bilirubin: 0.5 mg/dL (ref 0.0–1.2)
Total Protein: 6.7 g/dL (ref 6.5–8.1)

## 2023-09-29 LAB — FERRITIN: Ferritin: 55 ng/mL (ref 11–307)

## 2023-09-29 LAB — IRON AND IRON BINDING CAPACITY (CC-WL,HP ONLY)
Iron: 82 ug/dL (ref 28–170)
Saturation Ratios: 21 % (ref 10.4–31.8)
TIBC: 391 ug/dL (ref 250–450)
UIBC: 309 ug/dL (ref 148–442)

## 2023-09-29 LAB — CBC WITH DIFFERENTIAL (CANCER CENTER ONLY)
Abs Immature Granulocytes: 0.02 10*3/uL (ref 0.00–0.07)
Basophils Absolute: 0 10*3/uL (ref 0.0–0.1)
Basophils Relative: 0 %
Eosinophils Absolute: 0.3 10*3/uL (ref 0.0–0.5)
Eosinophils Relative: 4 %
HCT: 35.9 % — ABNORMAL LOW (ref 36.0–46.0)
Hemoglobin: 11.6 g/dL — ABNORMAL LOW (ref 12.0–15.0)
Immature Granulocytes: 0 %
Lymphocytes Relative: 18 %
Lymphs Abs: 1.4 10*3/uL (ref 0.7–4.0)
MCH: 30.1 pg (ref 26.0–34.0)
MCHC: 32.3 g/dL (ref 30.0–36.0)
MCV: 93 fL (ref 80.0–100.0)
Monocytes Absolute: 0.7 10*3/uL (ref 0.1–1.0)
Monocytes Relative: 9 %
Neutro Abs: 5.2 10*3/uL (ref 1.7–7.7)
Neutrophils Relative %: 69 %
Platelet Count: 241 10*3/uL (ref 150–400)
RBC: 3.86 MIL/uL — ABNORMAL LOW (ref 3.87–5.11)
RDW: 13.5 % (ref 11.5–15.5)
WBC Count: 7.6 10*3/uL (ref 4.0–10.5)
nRBC: 0 % (ref 0.0–0.2)

## 2023-09-29 LAB — TSH: TSH: 2.744 u[IU]/mL (ref 0.350–4.500)

## 2023-09-29 MED ORDER — ISOSORBIDE MONONITRATE ER 60 MG PO TB24: 60.0000 mg | ORAL_TABLET | Freq: Every day | ORAL | 3 refills | Status: DC

## 2023-09-29 MED ORDER — APIXABAN 5 MG PO TABS: 5.0000 mg | ORAL_TABLET | Freq: Two times a day (BID) | ORAL | 0 refills | Status: DC

## 2023-09-29 MED ORDER — CARVEDILOL 6.25 MG PO TABS: ORAL_TABLET | ORAL | 3 refills | Status: DC

## 2023-09-29 NOTE — Patient Instructions (Addendum)
Medication Instructions:    Carvedilol  6.25 mg  take 2 tablets in the morning  and 1 tablet in the evening   Increase Imdur 60 mg   daily at bedtime  If you have to take Nitroglycerin  then take Imdur  2 tablets for 2  days afterwards.   Check blood pressure  if you  feel  funny  or blood pressure is above 150  systolic  next  evening  double your dose of Carvedilol   If blood pressure is less than 110 systolic in the morning do not take your Losartan and hydrated   *If you need a refill on your cardiac medications before your next appointment, please call your pharmacy*   Lab Work: Not needed    Testing/Procedures:  Not needed  Follow-Up: At [facility], you and your health needs are our priority.  As part of our continuing mission to provide you with exceptional heart care, we have created designated Provider Care Teams.  These Care Teams include your primary Cardiologist (physician) and Advanced Practice Providers (APPs -  Physician Assistants and Nurse Practitioners) who all work together to provide you with the care you need, when you need it.     Your next appointment:   5 month(s)  The format for your next appointment:   In Person  Provider:   Bryan Lemma, MD

## 2023-09-29 NOTE — Progress Notes (Unsigned)
Cardiology Office Note:  .   Date:  10/01/2023  ID:  [name], DOB Jul 12, 1941, MRN [id number] PCP: Shelva Majestic, MD  [facility] Providers Cardiologist:  Bryan Lemma, MD Electrophysiologist:  Thurmon Fair, MD     Chief Complaint  Patient presents with   Follow-up   Coronary Artery Disease    Complaints of chest pain   Atrial Fibrillation    Has heart racing episodes but nothing prolonged   Patient Profile: .     [name] is a mildly obese 83 y.o. female  with a detailed PMH reviewed below who presents here for 31-month follow-up at the request of Shelva Majestic, MD.  CV History  CAD - 07/2010 - Progressive Angina: BMS PCI RCA & LAD (pre-op Back Sgx)  03/2011 Unstable Angina => LAD proximal ISR -> Overlapping DES w/ PTCA of jailed D2 02/2012: Patent Stents -> 08/2012 (peri-Op MI) patent stents Most recent cath was July 2016: Widely patent LAD stents with D2 jailed.  Minimal D2 stenosis.  Proximal to mid RCA BMS with less than 10% ISR.  OM1 and D1 60% stenosis.-Stable.  Myoview May 2021: No ischemia or infarct.  LOW RISK => has chronic intermittent severe chest pain which she calls angina Chronic HFpEF Chronic bilateral LE edema Celiac Artery Stenosis - s/p Stent (2018) Bilateral Renal Atery Stenosis -> s/p L RA Stent (2011) Paroxysmal atrial tachycardia -> last anywhere from 30 seconds to 10 minutes. Symptomatic bradycardia with sinus pauses, Mobitz 2 AVB => s/p dual-chamber PPM Paroxysmal atrial fibrillation -> has short bursts lasting just a few hours. HLD-statin myopathy.  Unwilling to try any cholesterol medication     [name] was last seen on February 19, 2023 for routine follow-up.  As usual, she notes her intermittent episodes of chest discomfort happen several times a day with with activity.  They were occurring with shopping but that would also occur when not shopping.  She would also noted short little bursts of tachycardia but nothing  prolonged to suggest A-fib.  She had not taken amiodarone.  NYHA II CHF symptoms: Edema seem to be pretty well-controlled with twice daily 80 mg Lasix. Plan was to increase Imdur to 60 mg daily.  2 to 3 days where she TAVR to require breakthrough NTG. We reviewed the fact that she had had multiple negative stress test-most recently in 2021. Continued to refuse to take with the medicines were discussed.  Subjective  Discussed the use of AI scribe software for clinical note transcription with the patient, who gave verbal consent to proceed.  History of Present Illness   The patient, with a history of cardiac issues and cancer, presents with concerns about increased chest pain and elevated blood pressure. The patient reports having to take nitroglycerin multiple times in the past week due to chest pain. She also mentions a recent blood pressure reading of 180 at another doctor's office.   The patient has been experiencing irregular heart rhythms, which she describes as "skips and hops." She has a pacemaker, but expresses concern that it may not be functioning correctly, as she received a notice stating it hadn't worked since October. However, the patient also mentions that she has not felt the need to take amiodarone, a medication for heart rhythm problems, very often.  The patient also reports experiencing leg cramps and has been taking mustard as a home remedy. She has been experiencing swelling in her ankles, which she manages by putting on  her shoes in the morning to prevent the swelling from becoming too severe.  The patient's blood pressure has been fluctuating, with a recent reading as high as 228. She expresses concern about the potential for dizziness if her blood pressure medication dosage is increased. The patient is currently taking spironolactone, furosemide, losartan, and carvedilol for her cardiac issues.  The patient has a history of cancer (unusual cancer - L leg) and has undergone  radiation therapy.  The patient has been undergoing investigations for an issue with a potential new nodule (scar/fluid vs. tumor), with recent MRI and CT scans showing abnormalities around the incision site. The patient reports discomfort from prednisone, which was administered as part of the imaging process. She reports that her cancer medication contributed to her reaching the Medicare "donut hole" early in the year. The patient also mentions a history of atrial fibrillation, which was an unexpected diagnosis, but has not really noted many breakthrough episodes.      Despite all of her symptoms, she continues to be very active doing her favorite activity which is cooking and baking.  She also enjoys canning fruits, vegetables and jams.  As usual, she brought one of her traditional treats (chocolate-fudge brownies).  She is in good spirits and always jovial, and many of her symptoms tend to be a chronic borderline hypochondriacal complex.  She has been evaluated for chest pain off and on since her follow-up stents roughly 14 years ago.  Since then she has had several additional ischemic evaluations, all of which have been normal.  She does have some discomfort at her pacemaker site, and is having issues with remote monitoring of her pacemaker due to signal connection for her cellular phone.  She also understood this connectivity issue as being an issue with the actual pacemaker as opposed to the connection via cellular.  Cardiovascular ROS: positive for - chest pain, dyspnea on exertion, edema, orthopnea, palpitations, and rapid heart rate negative for - paroxysmal nocturnal dyspnea, shortness of breath, or syncope or near syncope or TIA or emesis.,  Claudication.  Melena, hematochezia, materia epistaxis.       Objective   Current Cardiac Meds:  Carvedilol 6.25 mg twice daily, Imdur 30 mg daily (with plan to increase to 60 mg for 2 to 3 days if NTG use) Furosemide 80 mg twice daily, Eliquis 5  mg, Losartan 50 mg daily and spironolactone 25 mg Mondays Mondays Fridays. PRN amiodarone 200 mg tablets-for breakthrough episodes of A-fib take 1 mg x 1, and if remains after 12 hours take additional TID until episode breaks.  Studies Reviewed: Marland Kitchen       No recent studies  Unable to obtain labs from PCP-most recently [place] 2023 PMH not assessed times as she is not taking medications. Last A1c was 6.0.  Last creatinine on 03/21/2023 was 0.9.  Risk Assessment/Calculations:    CHA2DS2-VASc Score = 5   This indicates a 7.2% annual risk of stroke. The patient's score is based upon: CHF History: 0 HTN History: 1 Diabetes History: 0 Stroke History: 0 Vascular Disease History: 1 Age Score: 2 Gender Score: 1   On DOAC BP elevated.  Plan is to increase current medication and monitor at home.  Has had issues with low pressures as well.   Physical Exam:   VS:  BP (!) 168/68 (BP Location: Left Arm, Patient Position: Sitting, Cuff Size: Large)   Pulse 85   Ht 5\' 8"  (1.727 m)   Wt 200 lb (90.7 kg)  SpO2 96%   BMI 30.41 kg/m    Wt Readings from Last 3 Encounters:  09/29/23 200 lb (90.7 kg)  09/29/23 199 lb (90.3 kg)  08/07/23 196 lb 12.8 oz (89.3 kg)    GEN: Well nourished, well groomed in no acute distress; mildly obese but healthy-appearing. NECK: No JVD; No carotid bruits CARDIAC: Normal S1, S2; RRR, no murmurs, rubs, gallops; she still has costosternal tenderness. RESPIRATORY:  Clear to auscultation without rales, wheezing or rhonchi ; nonlabored, good air movement. ABDOMEN: Soft, non-tender, non-distended EXTREMITIES: 1-2+ bilateral ankle edema; No deformity, but does walk with some kyphosis and is slow somewhat steady gait     ASSESSMENT AND PLAN: .    Problem List Items Addressed This Visit       Cardiology Problems   Congestive heart failure with LV diastolic dysfunction, NYHA class 2 (HCC) (Chronic)   Likely related to hypertension.Elevated blood pressure readings  reported, with a recent reading of 180. Patient reports arm pain during blood pressure measurements. Currently on Losartan and Carvedilol. -Increase Carvedilol 6.25mg  2 tabs in the morning and 1 tab in the evening -If blood pressure consistently above 150, double the evening dose of Carvedilol. -If blood pressure below 110 and patient feels lightheaded, hold Losartan and hydrate. -Encourage patient to monitor blood pressure at home, especially if feeling unwell. -Continue standing dose of Lasix 80 mg twice daily with additional doses as needed for weight gain more than 3 pounds or worsening dyspnea.      Relevant Medications   isosorbide mononitrate (IMDUR) 60 MG 24 hr tablet   carvedilol (COREG) 6.25 MG tablet   apixaban (ELIQUIS) 5 MG TABS tablet   Coronary artery disease involving native coronary artery of native heart with angina pectoris (HCC) - Primary (Chronic)   She has several types of chest pain and is difficult to tell what is truly angina. She remains active and continues to push forward despite having "chest pain "and palpitations. Most recent Myoview was nonischemic in 2021.  Recurrent symptoms:  - Increase the daytime dose of carvedilol to 12.5 mg and continue with 6.25 mg in the evening. - Increase Imdur to 60 mg daily, and if she uses as needed nitroglycerin, she will take 2 tablets daily for 3 days -Continue losartan -Not on aspirin or Plavix because of Eliquis       Relevant Medications   isosorbide mononitrate (IMDUR) 60 MG 24 hr tablet   carvedilol (COREG) 6.25 MG tablet   apixaban (ELIQUIS) 5 MG TABS tablet   Essential hypertension (Chronic)   BP now high despite having been stable on carvedilol 6.25 mg twice daily, losartan 50 mg daily and spironolactone 25 mg every other day.  Will increase morning dose of carvedilol to 12.5 mg twice daily and monitor.  In the past she did not tolerate further titration of medications.      Relevant Medications   isosorbide  mononitrate (IMDUR) 60 MG 24 hr tablet   carvedilol (COREG) 6.25 MG tablet   apixaban (ELIQUIS) 5 MG TABS tablet   Hypercoagulable state due to paroxysmal atrial fibrillation (HCC) (Chronic)   CHA2DS2-VASc score is 5, and the concern is that her A-fib episodes tend to be indolent-she is more symptomatic with PAT and did not necessarily notice an episode of A-fib.  As such it is difficult to know how often she is going in and out.  Remains on Eliquis 5 mg twice daily without any bleeding issues. Okay to hold Eliquis HPI for surgeries  or procedures.   Also okay to hold for 1 to 2 days for bleeding or bruising.      Relevant Medications   isosorbide mononitrate (IMDUR) 60 MG 24 hr tablet   carvedilol (COREG) 6.25 MG tablet   apixaban (ELIQUIS) 5 MG TABS tablet   Hyperlipidemia associated with type 2 diabetes mellitus (HCC) (Chronic)   Due to extensive intolerance to statin with memory issues and myalgias, she stopped taking them.  She then cites several people that she knows who had "horrible issues with cholesterol medications and therefore is not interested in taking any medications including red yeast rice, Lovaza, Vascepa, Zetia, Nexletol.  She is also declined consult with our lipid clinic to discuss inclisiran or Repatha/Praluent.  At this point we made a conscious decision based on her advanced age and the pain she has been stable for 10 years regarding standpoint of it we will forego further testing and further discussions about lipid management.      Relevant Medications   isosorbide mononitrate (IMDUR) 60 MG 24 hr tablet   carvedilol (COREG) 6.25 MG tablet   apixaban (ELIQUIS) 5 MG TABS tablet   PAF (paroxysmal atrial fibrillation) (HCC) (Chronic)   Patient reports feeling "skippy beats" and discomfort. Currently on Amiodarone and has a pacemaker. -Continue Amiodarone as needed. -Continue CVA prophylaxis with Eliquis. -Increase daytime dose of Carvedilol to two 6.25mg  tablets and  maintain evening dose at one 6.25mg  tablet. -Schedule appointment with Dr. Royann Shivers for pacemaker check in a couple of months.      Relevant Medications   isosorbide mononitrate (IMDUR) 60 MG 24 hr tablet   carvedilol (COREG) 6.25 MG tablet   apixaban (ELIQUIS) 5 MG TABS tablet   PAT (paroxysmal atrial tachycardia) (HCC) (Chronic)   I truly suspect that her brief episodes of tachycardia are probably not A-fib but more atrial tachycardia.  Will wait to see what most recent PPM interrogation will be. Arrange for in clinic follow-up.      Relevant Medications   isosorbide mononitrate (IMDUR) 60 MG 24 hr tablet   carvedilol (COREG) 6.25 MG tablet   apixaban (ELIQUIS) 5 MG TABS tablet     Other   Costochondritis (Chronic)   Exertional dyspnea (Chronic)   Chronic issue.  Related to deconditioning and potentially mild diastolic dysfunction.  Will work on lowering her blood pressure. See hypertension and lower extremity edema      Myalgia due to statin (Chronic)   [name] refuses to take any other "cholesterol medications "-not interested in injectables..      Pacemaker (Chronic)   Having difficulty with telemetry monitoring from home.  Will need to arrange inpatient clinic follow-up with Dr. Royann Shivers.      Stasis edema of bilateral lower extremity (Chronic)   Patient reports difficulty with shoe fitting due to swelling, particularly in the evening. Currently on Spironolactone and Furosemide. -Continue current regimen of Spironolactone on Monday, Wednesday, Friday and Furosemide twice daily.        Follow-up -Check blood pressure in 1 week. -See Dr. Royann Shivers in a couple of months for pacemaker check. -Return visit in June. Return in about 5 months (around 02/27/2024) for Routine follow up with me.  I spent 46 minutes in the care of [name] today including reviewing outside labs from PCP via [facility] (2 min), face to face time discussing treatment options (35 min -> we  discussed her various complaints including intermittent chest pain symptoms that seem to be musculoskeletal in nature, palpitations, edema.  We discussed treatment options and decision making about whether or not we should go forward with additional testing or simply adjust medications.  We determined we would adjust medications and see back in follow-up to determine progression of symptoms.  We also reviewed her previous studies together),  9 minutes dictating, and documenting in the encounter.     Signed, Marykay Lex, MD, MS Bryan Lemma, M.D., M.S. Interventional Cardiologist  [facility], [facility]  Pager # [phone] Phone # [phone] [address] [place], [place] [address]

## 2023-09-29 NOTE — Progress Notes (Signed)
Reviewed MRI which shows  Patient is on observation and has not had any navigational needs for some time. Will discontinue active navigation at this time but be available to the patient as needed in the future.   Oncology Nurse Navigator Documentation     09/29/2023   10:00 AM  Oncology Nurse Navigator Flowsheets  Navigation Complete Date: 09/29/2023  Post Navigation: Continue to Follow Patient? No  Reason Not Navigating Patient: No Treatment, Observation Only  Navigator Location [facility]  Navigator Encounter Type Follow-up Appt;Scan Review  Patient Visit Type MedOnc  Treatment Phase Post-Tx Follow-up  Barriers/Navigation Needs No Barriers At This Time  Interventions None Required  Acuity Level 1-No Barriers  Support Groups/Services Friends and Family  Time Spent with Patient 15

## 2023-09-29 NOTE — Progress Notes (Signed)
[facility] Hematology and Oncology Follow Up Visit  [name] [id number] Jun 26, 1941 83 y.o. 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  Still awaiting the MRI of the left knee.  This is not read yet.  I really think that she is doing nicely.  She has had no problems with cough.  She has had some burning in the left lower leg.  I again I believe this is probably from nerve damage from past surgery may be from radiation.  She has had no bleeding.  There is been no change in bowel or bladder habits.  She has had no nausea or vomiting.  She has had no rashes.  She is a little bit of leg swelling of the left leg but again this is more chronic.  Overall, I would say that her performance status is probably ECOG 1.    Medications:  Current Outpatient Medications:    alendronate (FOSAMAX) 70 MG tablet, TAKE 1 TABLET (70 MG TOTAL) BY MOUTH EVERY 7 DAYS WITH FULL GLASS WATER ON EMPTY STOMACH, Disp: 12 tablet, Rfl: 3   allopurinol (ZYLOPRIM) 100 MG tablet, Take 2 tablets (200 mg total) by mouth daily., Disp: 180 tablet, Rfl: 3   amiodarone (PACERONE) 200 MG tablet, TAKE 400 MG (2 TABLETS) ONE DOSE AS NEED FOR A FAST HEART RATE ,IF AFTER 12 HOURS STILL PRESENT TAKE 200 MG ( 1 TABLET) ONE DOSE FOR EACH EPISODE, Disp: 180 tablet, Rfl: 1   apixaban (ELIQUIS) 5 MG TABS tablet, Take 1 tablet (5 mg total) by mouth 2 (two) times daily., Disp: 180 tablet, Rfl: 3   Artificial Tear Solution (TEARS RENEWED OP), Apply 1 drop to eye daily as needed (dry eyes)., Disp: , Rfl:    carvedilol (COREG) 6.25 MG tablet, TAKE 1 TABLET BY MOUTH TWICE DAILY WITH A MEAL. MAY TAKE AN EXTRA TABLET DAILY IF HAVING PALPATIONS (Patient taking differently: Take 6.25 mg by mouth 2 (two) times daily with a meal.), Disp: 225  tablet, Rfl: 3   colchicine 0.6 MG tablet, Take by mouth., Disp: , Rfl:    cyanocobalamin (VITAMIN B12) 1000 MCG/ML injection, 1000 MCG INJECTION ONCE PER MONTH. PLEASE PROVIDE SYRINGES AS WELL, Disp: 3 mL, Rfl: 5   diclofenac sodium (VOLTAREN) 1 % GEL, Apply 2 g topically 4 (four) times daily as needed (pain)., Disp: , Rfl:    diphenhydrAMINE (BENADRYL) 50 MG tablet, Please take 50 mg of benadryl one hour before contrast injection., Disp: 1 tablet, Rfl: 0   furosemide (LASIX) 80 MG tablet, Take 80 mg by mouth 2 (two) times daily., Disp: , Rfl:    HYDROcodone-acetaminophen (NORCO/VICODIN) 5-325 MG tablet, Take 1 tablet by mouth every 6 (six) hours as needed for moderate pain (pain score 4-6)., Disp: 30 tablet, Rfl: 0   hyoscyamine (ANASPAZ) 0.125 MG TBDP disintergrating tablet, Place 1 tablet (0.125 mg total) under the tongue every 6 (six) hours as needed (abdominal cramping). (Patient not taking: Reported on 09/15/2023), Disp: 30 tablet, Rfl: 5   indapamide (LOZOL) 2.5 MG tablet, TAKE ONE TABLET BY MOUTH DAILY, 30 MINUTES BEFORE DAILY FUROSEMIDE., Disp: 90 tablet, Rfl: 1   isosorbide mononitrate (IMDUR) 30 MG 24 hr tablet, Take 1 tablet (30 mg total) by mouth daily. May take an extra dose if you use Nitroglycerin  sublingual  tablet for 2 days afterwards, Disp: 120 tablet, Rfl: 3   lidocaine (XYLOCAINE) 5 % ointment, Apply 1 application. topically as needed., Disp: 35.44 g, Rfl: 0   losartan (COZAAR) 50 MG tablet, TAKE 1 TABLET BY MOUTH EVERY DAY, Disp: 90 tablet, Rfl: 3   meclizine (ANTIVERT) 12.5 MG tablet, Take 1 tablet (12.5 mg total) by mouth 2 (two) times daily as needed for dizziness., Disp: 60 tablet, Rfl: 1   nitroGLYCERIN (NITROSTAT) 0.4 MG SL tablet, DISSOLVE 1 TABLET UNDER THE TONGUE EVERY 5 MINUTES AS NEEDED FOR CHEST PAIN, Disp: 25 tablet, Rfl: 6   nystatin (MYCOSTATIN/NYSTOP) powder, Apply 1 Application topically 3 (three) times daily., Disp: 30 g, Rfl: 2   potassium chloride (KLOR-CON  M) 10 MEQ tablet, TAKE 1 TABLET (10 MEQ TOTAL) BY MOUTH 2 (TWO) TIMES DAILY AS NEEDED (AS INSTRUCTED PER DR. HUNTER). (Patient not taking: Reported on 09/15/2023), Disp: 90 tablet, Rfl: 1   predniSONE (DELTASONE) 50 MG tablet, Please take 50 mg by mouth at 13 hours, 7 hours and 1 hour before contrast media injection., Disp: 3 tablet, Rfl: 0   spironolactone (ALDACTONE) 25 MG tablet, TAKE 1 TABLET BY MOUTH AS DIRECTED. TAKE 1 TABLET ON MONDAYS, WEDNESDAYS, AND FRIDAYS AT 6 PM., Disp: 45 tablet, Rfl: 3  Allergies:  Allergies  Allergen Reactions   Codeine Phosphate Anaphylaxis, Shortness Of Breath and Swelling   Contrast Media [Iodinated Contrast Media] Swelling    Swelling of the face. Reaction was to ionic contrast many years ago. Patient has had non-ionic contrast many time without pre medication and has had no reaction.    Statins Other (See Comments)    Hyperactivity   Tramadol Palpitations and Other (See Comments)    Hyperactivity    Past Medical History, Surgical history, Social history, and Family History were reviewed and updated.  Review of Systems: Review of Systems  Constitutional: Negative.   HENT:  Negative.    Eyes: Negative.   Respiratory: Negative.    Cardiovascular: Negative.   Gastrointestinal: Negative.   Endocrine: Negative.   Genitourinary: Negative.    Musculoskeletal:  Positive for arthralgias.  Neurological: Negative.   Hematological: Negative.   Psychiatric/Behavioral: Negative.      Physical Exam:  height is 5\' 8"  (1.727 m) and weight is 199 lb (90.3 kg). Her oral temperature is 98.4 F (36.9 C). Her blood pressure is 184/48 (abnormal) and her pulse is 78. Her respiration is 18 and oxygen saturation is 99%.   Wt Readings from Last 3 Encounters:  09/29/23 199 lb (90.3 kg)  08/07/23 196 lb 12.8 oz (89.3 kg)  07/29/23 195 lb (88.5 kg)    Physical Exam Vitals reviewed.  HENT:     Head: Normocephalic and atraumatic.  Eyes:     Pupils: Pupils are  equal, round, and reactive to light.  Cardiovascular:     Rate and Rhythm: Normal rate and regular rhythm.     Heart sounds: Normal heart sounds.  Pulmonary:     Effort: Pulmonary effort is normal.     Breath sounds: Normal breath sounds.  Abdominal:     General: Bowel sounds are normal.     Palpations: Abdomen is soft.  Musculoskeletal:        General: No tenderness or deformity. Normal range of motion.     Cervical back: Normal range of motion.     Comments: Behind the left knee is a surgical scar.  This is well-healed.  She has significant radiation dermatitis.  There  is couple areas of open sores.  There is no obvious discharge.  She has a little bit of swelling.    Lymphadenopathy:     Cervical: No cervical adenopathy.  Skin:    General: Skin is warm and dry.     Findings: No erythema or rash.  Neurological:     Mental Status: She is alert and oriented to person, place, and time.  Psychiatric:        Behavior: Behavior normal.        Thought Content: Thought content normal.        Judgment: Judgment normal.      Lab Results  Component Value Date   WBC 7.6 09/29/2023   HGB 11.6 (L) 09/29/2023   HCT 35.9 (L) 09/29/2023   MCV 93.0 09/29/2023   PLT 241 09/29/2023     Chemistry      Component Value Date/Time   NA 138 09/29/2023 0939   NA 136 04/29/2022 1230   K 4.5 09/29/2023 0939   CL 102 09/29/2023 0939   CO2 29 09/29/2023 0939   BUN 31 (H) 09/29/2023 0939   BUN 30 (H) 04/29/2022 1230   CREATININE 0.96 09/29/2023 0939   CREATININE 0.90 10/24/2016 1127      Component Value Date/Time   CALCIUM 9.5 09/29/2023 0939   ALKPHOS 67 09/29/2023 0939   AST 9 (L) 09/29/2023 0939   ALT 8 09/29/2023 0939   BILITOT 0.5 09/29/2023 0939       Impression and Plan: Ms. Bassin is a very charming 83 year old white female.  She has a sarcoma behind the left knee in the popliteal fossa.  This is a fairly high-grade sarcoma.  As such, she had this resected.  Her margins were  positive.  We have given her radiotherapy.  She completed this about 5 months ago.  It will be interesting to see what the MRI has to show.  Hopefully, this will give [place] an idea as to the status of her radiation that she took.  When we see her again, we will I will have to get a chest x-ray to make sure there is nothing going on with her chest.  We will go ahead and plan to get her back to see [place] in another 6 weeks.    Josph Macho, MD 1/27/202511:02 AM

## 2023-10-01 ENCOUNTER — Ambulatory Visit (INDEPENDENT_AMBULATORY_CARE_PROVIDER_SITE_OTHER): Payer: Medicare Other | Admitting: Family Medicine

## 2023-10-01 ENCOUNTER — Other Ambulatory Visit: Payer: Self-pay

## 2023-10-01 ENCOUNTER — Other Ambulatory Visit: Payer: Self-pay | Admitting: *Deleted

## 2023-10-01 ENCOUNTER — Encounter: Payer: Self-pay | Admitting: Cardiology

## 2023-10-01 VITALS — BP 172/72 | HR 80 | Ht 68.0 in | Wt 200.0 lb

## 2023-10-01 DIAGNOSIS — M25512 Pain in left shoulder: Secondary | ICD-10-CM

## 2023-10-01 DIAGNOSIS — M25511 Pain in right shoulder: Secondary | ICD-10-CM | POA: Diagnosis not present

## 2023-10-01 DIAGNOSIS — G8929 Other chronic pain: Secondary | ICD-10-CM | POA: Diagnosis not present

## 2023-10-01 DIAGNOSIS — I480 Paroxysmal atrial fibrillation: Secondary | ICD-10-CM | POA: Insufficient documentation

## 2023-10-01 MED ORDER — PREDNISONE 50 MG PO TABS: ORAL_TABLET | ORAL | 0 refills | Status: DC

## 2023-10-01 NOTE — Assessment & Plan Note (Signed)
Due to extensive intolerance to statin with memory issues and myalgias, she stopped taking them.  She then cites several people that she knows who had "horrible issues with cholesterol medications and therefore is not interested in taking any medications including red yeast rice, Lovaza, Vascepa, Zetia, Nexletol.  She is also declined consult with our lipid clinic to discuss inclisiran or Repatha/Praluent.  At this point we made a conscious decision based on her advanced age and the pain she has been stable for 10 years regarding standpoint of it we will forego further testing and further discussions about lipid management.

## 2023-10-01 NOTE — Assessment & Plan Note (Signed)
Likely related to hypertension.Elevated blood pressure readings reported, with a recent reading of 180. Patient reports arm pain during blood pressure measurements. Currently on Losartan and Carvedilol. -Increase Carvedilol 6.25mg  2 tabs in the morning and 1 tab in the evening -If blood pressure consistently above 150, double the evening dose of Carvedilol. -If blood pressure below 110 and patient feels lightheaded, hold Losartan and hydrate. -Encourage patient to monitor blood pressure at home, especially if feeling unwell. -Continue standing dose of Lasix 80 mg twice daily with additional doses as needed for weight gain more than 3 pounds or worsening dyspnea.

## 2023-10-01 NOTE — Patient Instructions (Addendum)
Thank you for coming in today.   You received an injection today. Seek immediate medical attention if the joint becomes red, extremely painful, or is oozing fluid.  Check back as needed

## 2023-10-01 NOTE — Assessment & Plan Note (Signed)
CHA2DS2-VASc score is 5, and the concern is that her A-fib episodes tend to be indolent-she is more symptomatic with PAT and did not necessarily notice an episode of A-fib.  As such it is difficult to know how often she is going in and out.  Remains on Eliquis 5 mg twice daily without any bleeding issues. Okay to hold Eliquis HPI for surgeries or procedures.   Also okay to hold for 1 to 2 days for bleeding or bruising.

## 2023-10-01 NOTE — Assessment & Plan Note (Signed)
[name] refuses to take any other "cholesterol medications "-not interested in injectables.Marland Kitchen

## 2023-10-01 NOTE — Assessment & Plan Note (Signed)
Chronic issue.  Related to deconditioning and potentially mild diastolic dysfunction.  Will work on lowering her blood pressure. See hypertension and lower extremity edema

## 2023-10-01 NOTE — Assessment & Plan Note (Signed)
Having difficulty with telemetry monitoring from home.  Will need to arrange inpatient clinic follow-up with Dr. Royann Shivers.

## 2023-10-01 NOTE — Assessment & Plan Note (Signed)
I truly suspect that her brief episodes of tachycardia are probably not A-fib but more atrial tachycardia.  Will wait to see what most recent PPM interrogation will be. Arrange for in clinic follow-up.

## 2023-10-01 NOTE — Assessment & Plan Note (Signed)
She has several types of chest pain and is difficult to tell what is truly angina. She remains active and continues to push forward despite having "chest pain "and palpitations. Most recent Myoview was nonischemic in 2021.  Recurrent symptoms:  - Increase the daytime dose of carvedilol to 12.5 mg and continue with 6.25 mg in the evening. - Increase Imdur to 60 mg daily, and if she uses as needed nitroglycerin, she will take 2 tablets daily for 3 days -Continue losartan -Not on aspirin or Plavix because of Eliquis

## 2023-10-01 NOTE — Assessment & Plan Note (Signed)
Patient reports feeling "skippy beats" and discomfort. Currently on Amiodarone and has a pacemaker. -Continue Amiodarone as needed. -Continue CVA prophylaxis with Eliquis. -Increase daytime dose of Carvedilol to two 6.25mg  tablets and maintain evening dose at one 6.25mg  tablet. -Schedule appointment with Dr. Royann Shivers for pacemaker check in a couple of months.

## 2023-10-01 NOTE — Assessment & Plan Note (Signed)
BP now high despite having been stable on carvedilol 6.25 mg twice daily, losartan 50 mg daily and spironolactone 25 mg every other day.  Will increase morning dose of carvedilol to 12.5 mg twice daily and monitor.  In the past she did not tolerate further titration of medications.

## 2023-10-01 NOTE — Progress Notes (Signed)
Tricia Payor, PhD, LAT, ATC acting as a scribe for Tricia Graham, MD.  [name] is a 83 y.o. female who presents to [facility] today for bilat shoulder pain. Pt was last seen by Dr. Denyse Amass on 03/26/23 and was given a R GH and L subacromial steroid injection.  Today, pt reports bilat shoulder pain returned around Christmas time, worsening lately. Pain will sometime radiate into the upper arm. She notes having trouble putting stuff up in her kitchen cabinets.  Pertinent review of systems: No fevers or chills  Relevant historical information: Sarcoma posterior knee status post excision   Exam:  BP (!) 172/72   Pulse 80   Ht 5\' 8"  (1.727 m)   Wt 200 lb (90.7 kg)   SpO2 95%   BMI 30.41 kg/m  General: Well Developed, well nourished, and in no acute distress.   MSK: Shoulders bilaterally decreased range of motion.    Lab and Radiology Results   Procedure: Real-time Ultrasound Guided Injection of left shoulder glenohumeral joint posterior approach Device: Philips Affiniti 50G/GE Logiq Images permanently stored and available for review in PACS Verbal informed consent obtained.  Discussed risks and benefits of procedure. Warned about infection, bleeding, hyperglycemia damage to structures among others. Patient expresses understanding and agreement Time-out conducted.   Noted no overlying erythema, induration, or other signs of local infection.   Skin prepped in a sterile fashion.   Local anesthesia: Topical Ethyl chloride.   With sterile technique and under real time ultrasound guidance: 40 mg of Kenalog and 2 mL of Marcaine injected into shoulder joint. Fluid seen entering the joint capsule.   Completed without difficulty   Pain immediately resolved suggesting accurate placement of the medication.   Advised to call if fevers/chills, erythema, induration, drainage, or persistent bleeding.   Images permanently stored and available for review  in the ultrasound unit.  Impression: Technically successful ultrasound guided injection.    Procedure: Real-time Ultrasound Guided Injection of right shoulder posterior approach glenohumeral joint. Device: Philips Affiniti 50G/GE Logiq Images permanently stored and available for review in PACS Verbal informed consent obtained.  Discussed risks and benefits of procedure. Warned about infection, bleeding, hyperglycemia damage to structures among others. Patient expresses understanding and agreement Time-out conducted.   Noted no overlying erythema, induration, or other signs of local infection.   Skin prepped in a sterile fashion.   Local anesthesia: Topical Ethyl chloride.   With sterile technique and under real time ultrasound guidance: 40 mg of Kenalog and 2 mL of Marcaine injected into glenohumeral joint. Fluid seen entering the joint capsule.   Completed without difficulty   Pain immediately resolved suggesting accurate placement of the medication.   Advised to call if fevers/chills, erythema, induration, drainage, or persistent bleeding.   Images permanently stored and available for review in the ultrasound unit.  Impression: Technically successful ultrasound guided injection.         Assessment and Plan: 83 y.o. female with chronic bilateral shoulder pain due to DJD.  Plan for repeat steroid injection.  Previous injection was in July 2024.  Spent about 6 months.  Plan for steroid injection continued shoulder activity and check back as needed.   PDMP not reviewed this encounter. Orders Placed This Encounter  Procedures   [place] LIMITED JOINT SPACE STRUCTURES UP BILAT(NO LINKED CHARGES)    Reason for Exam (SYMPTOM  OR DIAGNOSIS REQUIRED):   bilateral shoulder pain    Preferred imaging location?:   [place]  [facility]   No orders of the defined types were placed in this encounter.    Discussed warning signs or symptoms. Please see discharge instructions.  Patient expresses understanding.  The above documentation has been reviewed and is accurate and complete [name], M.D.

## 2023-10-01 NOTE — Assessment & Plan Note (Signed)
Patient reports difficulty with shoe fitting due to swelling, particularly in the evening. Currently on Spironolactone and Furosemide. -Continue current regimen of Spironolactone on Monday, Wednesday, Friday and Furosemide twice daily.

## 2023-10-09 ENCOUNTER — Ambulatory Visit: Payer: Medicare Other | Attending: Cardiovascular Disease | Admitting: Cardiovascular Disease

## 2023-10-09 ENCOUNTER — Encounter: Payer: Self-pay | Admitting: Cardiovascular Disease

## 2023-10-09 VITALS — BP 143/62 | HR 68 | Wt 194.6 lb

## 2023-10-09 DIAGNOSIS — I4719 Other supraventricular tachycardia: Secondary | ICD-10-CM | POA: Diagnosis not present

## 2023-10-09 DIAGNOSIS — I5030 Unspecified diastolic (congestive) heart failure: Secondary | ICD-10-CM | POA: Diagnosis not present

## 2023-10-09 DIAGNOSIS — I441 Atrioventricular block, second degree: Secondary | ICD-10-CM | POA: Diagnosis not present

## 2023-10-09 DIAGNOSIS — D6869 Other thrombophilia: Secondary | ICD-10-CM | POA: Diagnosis not present

## 2023-10-09 DIAGNOSIS — I25119 Atherosclerotic heart disease of native coronary artery with unspecified angina pectoris: Secondary | ICD-10-CM | POA: Diagnosis not present

## 2023-10-09 DIAGNOSIS — I10 Essential (primary) hypertension: Secondary | ICD-10-CM | POA: Diagnosis not present

## 2023-10-09 DIAGNOSIS — I480 Paroxysmal atrial fibrillation: Secondary | ICD-10-CM | POA: Diagnosis not present

## 2023-10-09 DIAGNOSIS — E1142 Type 2 diabetes mellitus with diabetic polyneuropathy: Secondary | ICD-10-CM | POA: Diagnosis not present

## 2023-10-09 MED ORDER — CARVEDILOL 12.5 MG PO TABS: 12.5000 mg | ORAL_TABLET | Freq: Two times a day (BID) | ORAL | 3 refills | Status: DC

## 2023-10-09 NOTE — Progress Notes (Signed)
 Cardiology Office Note:    Date:  10/09/2023   ID:  [name], DOB 03-26-1941, MRN [id number]  PCP:  Katrinka Garnette KIDD, MD   [facility] Providers Cardiologist:  Alm Clay, MD Electrophysiologist:  Jerel Balding, MD     Referring MD: Katrinka Garnette KIDD, MD   Chief Complaint  Patient presents with   Pacemaker Check    History of Present Illness:    [name] is a 83 y.o. female with a hx of CAD s/p remote PCI (BMS RCA, overlapping BMS and DES mid LAD), chronic heart failure with preserved left ventricular ejection fraction, hyperlipidemia, type 2 diabetes mellitus, paroxysmal atrial tachycardia as well as intermittent episodes of second-degree AV block Mobitz type II for which she underwent implantation of a dual-chamber permanent pacemaker on July 09, 2021.  The pacemaker procedure (implanted in left subclavian area), was complicated by pleuritic chest pain and the chest x-ray showed a very small right-sided pneumothorax.  The suspicion was that she has an atrial lead perforation that actually tracked through the pericardium to the right pleura.  She did not require chest tube placement, it resolved spontaneously.  Subsequent pacemaker monitoring has shown repeated episodes of mildly symptomatic paroxysmal atrial fibrillation lasting for several hours at a time.  She complains of occasional episodes of chest discomfort usually when she is working or rushing.  They resolve after she rests for a few minutes.  Sometimes she has to take 1 or 2 sublingual nitroglycerin .  They do not occur at full rest.  At her previous appointment with Dr. Clay, he increased her carvedilol  slightly.  Possibly there's been a little improvement since then, but the chest discomfort episodes still occur.  She has not had dizziness, palpitations or syncope.  She denies shortness of breath at rest or with usual activity.  Has occasional mild ankle edema.  She does not have orthopnea or PND.  She  has not had any focal neurological events or falls or bleeding problems.  She is on chronic Eliquis  anticoagulation.  As always she has a relatively broad pulse pressure around 140/60.  Comprehensive pacemaker interrogation shows normal device function.  She has a Medtronic Azure dual-chamber pacemaker implanted in 2022 that still has roughly 13 years of estimated generator longevity.  All lead parameters are in normal range.  She has 9.3% atrial pacing and and less than 0.1% ventricular pacing, even though her ECG today actually shows AV sequential pacing.  She has not had any sustained atrial fibrillation in the last 12 months but she has relatively frequent episodes of brief paroxysmal atrial tachycardia, some of which are labeled as ventricular tachycardia by her device.  I do not find any convincing evidence of true VT.     Past Medical History:  Diagnosis Date   Adrenal adenoma    Arthritis    fingers, right shoulder (09/09/2017)   Atrial fibrillation (HCC) 12/2021   New diagnosis noted on pacemaker interrogation.   Back pain with radiation    Chronic Back Pain - mutliple surgeries (including tumor removal)   Bilateral edema of lower extremity    Chronic, likely related to venous stasis   Bradycardia    Pacemaker placed   CAD S/P percutaneous coronary angioplasty    a) LHC: 07/30/10. -- 3.0x30mm Integrity BMS to pRCA & 2.5x 15mm Integrity BMS mLAD(@ D2).  b) Class III-IV Angina 03/2011: LHC- ISR in LAD BMS -- prox overlapping Promus DES 2.5x31mm and PTCA of jailed D2 ostium-prox 80%.  c) 02/03/12:  LHC- patent stents.  Jailed diagonal. with stable flow; d) Peri-OP NSTEMI 04/2012 - LHC in 12/'13 -    Celiac artery stenosis (HCC)    12/2016 - STENT placement   Complication of anesthesia    used to wake up wild years ago (09/09/2017)   Congestive heart failure with LV diastolic dysfunction, NYHA class 2 (HCC)    06/13/10:  last 2D echo-  EF >55%, Mild TR, Mod Conc LVH - Grade 1 diastolic  dysfunction (abnormal relaxation) --> LVEDP on Cath 28 mmHg & mild 2nd Pulm HTN   Diet-controlled type 2 diabetes mellitus (HCC)    Diverticulitis of colon (without mention of hemorrhage)(562.11)    Diverticulosis    Dyslipidemia, goal LDL below 70    Intolerant to statins   GERD (gastroesophageal reflux disease)    Hepatitis ~ 1957   yellow jaundice (01/14/2017)   Hepatomegaly    Hiatal hernia    History of blood transfusion 04/2012   when I had a heart attack   History of kidney stones    I've got a stone embedded in one of my kidneys (09/09/2017)   History of radiation therapy    Left Knee - 03/05/23-04/30/23- Dr. Lynwood Nasuti   History of stomach ulcers years ago   Irritable bowel syndrome (IBS)    Labile essential hypertension    Partially related to RAS   Mesenteric artery stenosis (HCC)    95% Celiac Artery - ostial, 20-30% SMA.  Bilateral Renal A: L RA stent patent, R RA 20-30% -- Conservative Management   Mobitz type 2 second degree AV block    Status post PPM placement   NSTEMI (non-ST elevated myocardial infarction) (HCC) 04/2012   Unclear the details, apparently this was postoperative from her back surgery that she was cleared for my last saw her in June.  Reportedly had stents placed   Pancreas divisum    On pancreatic enzyme   PAT (paroxysmal atrial tachycardia) (HCC)    Renal artery stenosis (HCC) 2011; 12/'13   a) Angiogram 02/03/12:  50-60%L RA stenosis, 40% R R Inferior artery; b) 12/'13: S/P L RA Stent ([facility]) 6.0 mm x 15 mm; c) Renal Duplex 10/2013: <60% L RA, <60 R RA, ~60% SMA & Celiac A.   S/P placement of cardiac pacemaker 07/2021   Medtronic   Stricture and stenosis of esophagus    Thyroid  nodule    Unspecified gastritis and gastroduodenitis without mention of hemorrhage     Past Surgical History:  Procedure Laterality Date   APPENDECTOMY     BACK SURGERY     BREAST DUCTAL SYSTEM EXCISION     right   BREAST SURGERY Right    CARDIAC  CATHETERIZATION N/A 03/16/2015   Procedure: Left Heart Cath and Coronary Angiography;  Surgeon: Alm LELON Clay, MD;  Location: [facility] INVASIVE CV LAB;  Service: Cardiovascular; Widely patent m-dLAD stents as wellas pRCA stent.  High LVEDP, small Diag & Om vessels with moderate stenosis    Cardiac Event Monitor  01/2017   Mostly NSR with occasional sinus tachycardia and rare bradycardia. No A. fib. No PND or PSVT. Rare PACs and PVCs.   CATARACT EXTRACTION W/ INTRAOCULAR LENS  IMPLANT, BILATERAL Bilateral    CHOLECYSTECTOMY OPEN     COLONOSCOPY  03/01/2009   normal    CORONARY ANGIOPLASTY WITH STENT PLACEMENT  07/30/2010   3.0x43mm Integrity BMS to RCA and 2.5x 15mm Integrity BMS LAD.     CORONARY ANGIOPLASTY  WITH STENT PLACEMENT  03/18/2011   Cutting Balloon PTCA of D2 (jailed - 80% ostial stenosis), DES PCI of mid LAD ISR - > Promus DES 2.5 x 16 mm postdilated to 2.8 mm) covering the proximal portion of the previous stent   DOBUTAMINE  STRESS ECHO  03/07/2015   [facility] (ordered for pre-op evaluation for EUS/ERCP --> abnormal EKG:  strreesss test shhoowwed 1 mm ST segment depressions downsloping. No wall motion abnormalities at peak exercise or at rest. Diastolic dysfunction was noted but normal systolic function - EF greater than 55%. No bouts of regurgitation or stenosis. Resting hypertension with exaggerated response   ESOPHAGOGASTRODUODENOSCOPY  04/08/2012   ESOPHAGOGASTRODUODENOSCOPY (EGD) WITH ESOPHAGEAL DILATION  X 2   FRACTURE SURGERY     HAMMER TOE SURGERY Bilateral    took bone off the top of 2nd toe on each foot   HIP SURGERY Left    something to do w/my back   JOINT REPLACEMENT     KNEE SURGERY Left    had fluid drained off it a couple times   LEFT HEART CATH AND CORONARY ANGIOGRAPHY  07/30/2010   severe LAD-diagonal 80%, moderate to severe proximal RCA.  Mean PAP 15 mmHg.  PCWP mean 17 mmHg.  RVP 45/11 mmHg.  PAP 47/24 mmHg, mean 31 mmHg.  LVEDP 27 mmHg   LEFT HEART  CATHETERIZATION WITH CORONARY ANGIOGRAM N/A 02/03/2012   Procedure: LEFT HEART CATHETERIZATION WITH CORONARY ANGIOGRAM;  Surgeon: Alm LELON Clay, MD;  Location: [facility] CATH LAB; widely patent LAD and RCA stents.  Patent D2 ostium.  Moderate L Renal A stenosis (56%), R Renal A 40-50% t.  LVEDP 20 mmHg.   LEFT HEART CATHETERIZATION WITH CORONARY ANGIOGRAM N/A 05/12/2014   Procedure: LEFT HEART CATHETERIZATION WITH CORONARY ANGIOGRAM;  Surgeon: Dorn JINNY Lesches, MD;  Location: [facility] CATH LAB;  Service: Cardiovascular: Stable CAD. Patent stents. Patent renal artery stent   LEFT HEART CATHETERIZATION WITH CORONARY ANGIOGRAM  08/2012   Peri-Op MI @ [facility] -- 40% ostial D1, patent LAD stents, 10% RCA ISR   LEFT HEART CATHETERIZATION WITH CORONARY ANGIOGRAM   03/18/2011   70% ISR of LAD stent just after D2 (D2 has ostial 80 to 90% stenosis.  20 to 30% ISR RCA.  EDP elevated at 28 mmHg   NM MYOVIEW  LTD  12/2016   LOW RISK study. No ischemia or infarction. EF 65-75%.   NM MYOVIEW  LTD  01/05/2020    LOW RISK. EF 60-65%.  No EKG changes.  No infarct, no ischemia.   PACEMAKER IMPLANT N/A 07/09/2021   Procedure: PACEMAKER IMPLANT;  Surgeon: Francyne Headland, MD;  Location: [facility] INVASIVE CV LAB;  Service: Cardiovascular;  Laterality: N/A;   PANCREAS SURGERY     stent in my pancreas; Dr Essie Minor   PERCUTANEOUS PINNING PHALANX FRACTURE OF HAND Left ~ 2013   PERIPHERAL VASCULAR BALLOON ANGIOPLASTY  09/09/2017   Procedure: PERIPHERAL VASCULAR BALLOON ANGIOPLASTY;  Surgeon: Serene Gaile LELON, MD;  Location: [facility] INVASIVE CV LAB;  Service: Cardiovascular;;  Celiac instent   PERIPHERAL VASCULAR INTERVENTION  01/14/2017   Procedure: Peripheral Vascular Intervention;  Surgeon: Serene Gaile LELON, MD;  Location: [facility] INVASIVE CV LAB;  Service: Cardiovascular;;  mesentric   RENAL ARTERY STENT Left 08/2012   @ [facility] - 6.0 mm x 15 mm   SPINE SURGERY     tumor removed 07/2010; Redo Surgery 04/2012;  Sacroiliac Sgx 08/2013   TOTAL ABDOMINAL HYSTERECTOMY     TOTAL  KNEE ARTHROPLASTY Left ~ 2012   TRANSTHORACIC ECHOCARDIOGRAM  07/10/2021   (Postop PPM): Normal LV size and function.  EF 60 to 65%.  No RWMA.  GR 1 DD.  Mild LA dilation.  Normal RV size and function.  Normal RAP.  Normal aortic and mitral valves.  No pericardial effusion.   VISCERAL ANGIOGRAM N/A 05/12/2014   Procedure: VISCERAL ANGIOGRAM;  Surgeon: Dorn JINNY Lesches, MD;  Location: [facility] CATH LAB;  25% ostial proximal celiac artery with downward takeoff.  23% proximal SMA and 56% proximal IMA.  Left renal artery stent widely patent.  Right renal artery is 20 to 30% proximal stenosis.   VISCERAL ANGIOGRAPHY N/A 01/14/2017   Procedure: Mesenteric  Angiography;  Surgeon: Serene Gaile ORN, MD;  Location: [facility] INVASIVE CV LAB;  Service: Cardiovascular;  Laterality: N/A;   VISCERAL ANGIOGRAPHY N/A 09/09/2017   Procedure: VISCERAL ANGIOGRAPHY;  Surgeon: Serene Gaile ORN, MD;  Location: [facility] INVASIVE CV LAB;  Service: Cardiovascular;  Laterality: N/A;    Current Medications: Current Meds  Medication Sig   alendronate  (FOSAMAX ) 70 MG tablet TAKE 1 TABLET (70 MG TOTAL) BY MOUTH EVERY 7 DAYS WITH FULL GLASS WATER ON EMPTY STOMACH   allopurinol  (ZYLOPRIM ) 100 MG tablet Take 2 tablets (200 mg total) by mouth daily.   amiodarone  (PACERONE ) 200 MG tablet TAKE 400 MG (2 TABLETS) ONE DOSE AS NEED FOR A FAST HEART RATE ,IF AFTER 12 HOURS STILL PRESENT TAKE 200 MG ( 1 TABLET) ONE DOSE FOR EACH EPISODE   apixaban  (ELIQUIS ) 5 MG TABS tablet Take 1 tablet (5 mg total) by mouth 2 (two) times daily.   Artificial Tear Solution (TEARS RENEWED OP) Apply 1 drop to eye daily as needed (dry eyes).   carvedilol  (COREG ) 12.5 MG tablet Take 1 tablet (12.5 mg total) by mouth 2 (two) times daily. May take an additional 12.5 mg if needed for extra palp.   colchicine 0.6 MG tablet Take by mouth.   cyanocobalamin  (VITAMIN B12) 1000 MCG/ML injection 1000 MCG INJECTION ONCE  PER MONTH. PLEASE PROVIDE SYRINGES AS WELL   furosemide  (LASIX ) 80 MG tablet Take 80 mg by mouth 2 (two) times daily.   indapamide  (LOZOL ) 2.5 MG tablet TAKE ONE TABLET BY MOUTH DAILY, 30 MINUTES BEFORE DAILY FUROSEMIDE .   isosorbide  mononitrate (IMDUR ) 60 MG 24 hr tablet Take 1 tablet (60 mg total) by mouth daily. May take an extra dose if you use Nitroglycerin   sublingual tablet for 2 days afterwards   losartan  (COZAAR ) 50 MG tablet TAKE 1 TABLET BY MOUTH EVERY DAY   meclizine  (ANTIVERT ) 12.5 MG tablet Take 1 tablet (12.5 mg total) by mouth 2 (two) times daily as needed for dizziness.   nitroGLYCERIN  (NITROSTAT ) 0.4 MG SL tablet DISSOLVE 1 TABLET UNDER THE TONGUE EVERY 5 MINUTES AS NEEDED FOR CHEST PAIN   nystatin  (MYCOSTATIN /NYSTOP ) powder Apply 1 Application topically 3 (three) times daily.   potassium chloride  (KLOR-CON  M) 10 MEQ tablet TAKE 1 TABLET (10 MEQ TOTAL) BY MOUTH 2 (TWO) TIMES DAILY AS NEEDED (AS INSTRUCTED PER DR. HUNTER).   spironolactone  (ALDACTONE ) 25 MG tablet TAKE 1 TABLET BY MOUTH AS DIRECTED. TAKE 1 TABLET ON MONDAYS, WEDNESDAYS, AND FRIDAYS AT 6 PM.   [DISCONTINUED] carvedilol  (COREG ) 6.25 MG tablet Take 12.5 mg ( 2 tablets) in the morning and 6.25 mg in the evening.  MAY TAKE AN EXTRA TABLET 6.25 MG DAILY IF HAVING PALPATIONS     Allergies:   Codeine phosphate, Contrast media [iodinated contrast media], Statins,  and Tramadol   Social History   Socioeconomic History   Marital status: Married    Spouse name: Not on file   Number of children: 1   Years of education: Not on file   Highest education level: Not on file  Occupational History   Occupation: gift shop owner    Employer: [employer]    Comment: She takes cakes and pies to sell, also canned vegetables and makes [place] enjoys.  Tobacco Use   Smoking status: Never   Smokeless tobacco: Never  Vaping Use   Vaping status: Never Used  Substance and Sexual Activity   Alcohol use: No   Drug  use: No   Sexual activity: Not Currently  Other Topics Concern   Not on file  Social History Narrative   Married 63 years in 2023. 1 child Conne in [place].  1 grandchild in [place].       Retired- worked at [employer] most recently[employer] and with teachers- aide position   - still selling cakes and pies   Very socially active, enjoys cooking and having get-togethers her house.       Hobbies: cooking/baking, loves to feed people   Social Drivers of Health   Financial Resource Strain: Low Risk  (03/10/2023)   Overall Financial Resource Strain (CARDIA)    Difficulty of Paying Living Expenses: Not hard at all  Food Insecurity: No Food Insecurity (03/10/2023)   Hunger Vital Sign    Worried About Running Out of Food in the Last Year: Never true    Ran Out of Food in the Last Year: Never true  Transportation Needs: No Transportation Needs (03/10/2023)   PRAPARE - Administrator, Civil Service (Medical): No    Lack of Transportation (Non-Medical): No  Physical Activity: Inactive (03/10/2023)   Exercise Vital Sign    Days of Exercise per Week: 0 days    Minutes of Exercise per Session: 0 min  Stress: No Stress Concern Present (03/10/2023)   [employer] of Occupational Health - Occupational Stress Questionnaire    Feeling of Stress : Not at all  Social Connections: Moderately Integrated (03/10/2023)   Social Connection and Isolation Panel [NHANES]    Frequency of Communication with Friends and Family: More than three times a week    Frequency of Social Gatherings with Friends and Family: More than three times a week    Attends Religious Services: More than 4 times per year    Active Member of [employer] or Organizations: No    Attends Engineer, Structural: Never    Marital Status: Married     Family History: The patient's family history includes Cancer in her daughter and father; Colitis in her maternal grandfather; Diabetes in her brother; Heart attack in her brother,  brother, and father; Heart disease in her father; Stroke in her mother. There is no history of Colon cancer or Stomach cancer.  ROS:   Please see the history of present illness.     All other systems reviewed and are negative.  EKGs/Labs/Other Studies Reviewed:    The following studies were reviewed today: Comprehensive pacemaker check in the office today.  EKG:   ECG was not checked today.  Cardiac electrogram shows atrial sensed, ventricular sensed rhythm (normal sinus rhythm).  Recent Labs: 10/17/2022: Magnesium  2.3 09/29/2023: ALT 8; BUN 31; Creatinine 0.96; Hemoglobin 11.6; Platelet Count 241; Potassium 4.5; Sodium 138; TSH 2.744  Recent Lipid Panel    Component Value  Date/Time   CHOL 229 (H) 01/10/2022 1014   CHOL 245 (H) 09/08/2019 1045   TRIG 327.0 (H) 01/10/2022 1014   HDL 36.90 (L) 01/10/2022 1014   HDL 36 (L) 09/08/2019 1045   CHOLHDL 6 01/10/2022 1014   VLDL 65.4 (H) 01/10/2022 1014   LDLCALC 152 (H) 09/08/2019 1045   LDLDIRECT 147.0 01/10/2022 1014     Risk Assessment/Calculations:    CHA2DS2-VASc Score = 5   This indicates a 7.2% annual risk of stroke. The patient's score is based upon: CHF History: 0 HTN History: 1 Diabetes History: 0 Stroke History: 0 Vascular Disease History: 1 Age Score: 2 Gender Score: 1           Physical Exam:    VS:  BP (!) 143/62   Pulse 68   Wt 194 lb 9.6 oz (88.3 kg)   SpO2 96%   BMI 29.59 kg/m     Wt Readings from Last 3 Encounters:  10/09/23 194 lb 9.6 oz (88.3 kg)  10/01/23 200 lb (90.7 kg)  09/29/23 200 lb (90.7 kg)     General: Alert, oriented x3, no distress, healthy left subclavian pacemaker site Head: no evidence of trauma, PERRL, EOMI, no exophtalmos or lid lag, no myxedema, no xanthelasma; normal ears, nose and oropharynx Neck: normal jugular venous pulsations and no hepatojugular reflux; brisk carotid pulses without delay and no carotid bruits Chest: clear to auscultation, no signs of consolidation  by percussion or palpation, normal fremitus, symmetrical and full respiratory excursions Cardiovascular: normal position and quality of the apical impulse, regular rhythm, normal first and second heart sounds, no murmurs, rubs or gallops Abdomen: no tenderness or distention, no masses by palpation, no abnormal pulsatility or arterial bruits, normal bowel sounds, no hepatosplenomegaly Extremities: no clubbing, cyanosis or edema; 2+ radial, ulnar and brachial pulses bilaterally; 2+ right femoral, posterior tibial and dorsalis pedis pulses; 2+ left femoral, posterior tibial and dorsalis pedis pulses; no subclavian or femoral bruits Neurological: grossly nonfocal Psych: Normal mood and affect   HYPERTENSION CONTROL Vitals:   10/09/23 0927 10/09/23 1448  BP: (!) 142/60 (!) 143/62    The patient's blood pressure is elevated above target today.  In order to address the patient's elevated BP: A current anti-hypertensive medication was adjusted today.       ASSESSMENT:    1. PAF (paroxysmal atrial fibrillation) (HCC)   2. PAT (paroxysmal atrial tachycardia) (HCC)   3. Acquired thrombophilia (HCC)   4. Coronary artery disease involving native coronary artery of native heart with angina pectoris (HCC)   5. Essential hypertension   6. Type 2 diabetes mellitus with peripheral neuropathy (HCC)   7. Mobitz type 2 second degree atrioventricular block   8. Congestive heart failure with LV diastolic dysfunction, NYHA class 2 (HCC)      PLAN:    In order of problems listed above:  AFib: Extremely low prevalence, none seen in the last 12 months.  She has had infrequent paroxysmal atrial fibrillation, usually lasting for a few minutes, occasionally lasting for a few hours with an overall burden around 1%.  CHA2DS2-VASc 6-7 (age 69, gender, HTN, CAD, diast HF, +/-prediabetes).   On Eliquis .   PAT: These episodes are more frequent, but remain very brief and appear to be asymptomatic.SABRA    Anticoagulation: She has not had any bleeding complications or falls. CAD: Seems to be describing exertional angina.  On carvedilol  and long-acting nitrates.  Will increase the carvedilol  to 12.5 mg twice daily.  Will  discuss with Dr. Anner whether he believes we should reevaluate with a nuclear stress test (no significant coronary insufficiency on scan in May 2021). HTN: I hope she will tolerate the higher dose of carvedilol .  She has a very broad pulse pressure with a borderline low diastolic blood pressure.  She has not had any complaints of dizziness. DM: Improved control, hemoglobin A1c has dropped to 6.0% without medication 2nd deg AVB: Well-documented before pacemaker implantation, but very rarely requires ventricular pacing. Pacemaker: Normal device function.  Continue remote downloads every 3 months. CHF: Functional class I-II, clinically euvolemic.          Medication Adjustments/Labs and Tests Ordered: Current medicines are reviewed at length with the patient today.  Concerns regarding medicines are outlined above.  Orders Placed This Encounter  Procedures   EKG 12-Lead   Meds ordered this encounter  Medications   carvedilol  (COREG ) 12.5 MG tablet    Sig: Take 1 tablet (12.5 mg total) by mouth 2 (two) times daily. May take an additional 12.5 mg if needed for extra palp.    Dispense:  180 tablet    Refill:  3    Discontinue previous dose of 6.25 mg.     Patient Instructions  Medication Instructions:    Increase Carvediolol 12.5 mg  twice a day   *If you need a refill on your cardiac medications before your next appointment, please call your pharmacy*   Lab Work: Not needed    Testing/Procedures: Not needed   Follow-Up: At [facility], you and your health needs are our priority.  As part of our continuing mission to provide you with exceptional heart care, we have created designated Provider Care Teams.  These Care Teams include your primary Cardiologist  (physician) and Advanced Practice Providers (APPs -  Physician Assistants and Nurse Practitioners) who all work together to provide you with the care you need, when you need it.     Your next appointment:   12 month(s)  The format for your next appointment:   In Person  Provider:   Dr Francyne         Signed, Jerel Francyne, MD  10/09/2023 2:51 PM    [email or web address]

## 2023-10-09 NOTE — Patient Instructions (Addendum)
 Medication Instructions:    Increase Carvediolol 12.5 mg  twice a day   *If you need a refill on your cardiac medications before your next appointment, please call your pharmacy*   Lab Work: Not needed    Testing/Procedures: Not needed   Follow-Up: At [facility], you and your health needs are our priority.  As part of our continuing mission to provide you with exceptional heart care, we have created designated Provider Care Teams.  These Care Teams include your primary Cardiologist (physician) and Advanced Practice Providers (APPs -  Physician Assistants and Nurse Practitioners) who all work together to provide you with the care you need, when you need it.     Your next appointment:   12 month(s)  The format for your next appointment:   In Person  Provider:   Dr Francyne

## 2023-10-14 ENCOUNTER — Ambulatory Visit: Payer: Medicare Other | Admitting: Cardiology

## 2023-10-23 ENCOUNTER — Other Ambulatory Visit: Payer: Self-pay | Admitting: Family Medicine

## 2023-10-23 NOTE — Telephone Encounter (Signed)
Copied from CRM [id number]. Topic: Clinical - Medication Refill >> Oct 23, 2023 12:17 PM Fredrich Romans wrote: Most Recent Primary Care Visit:  Provider: [facility]  Department: [facility]  Visit Type: LAB  Date: 08/21/2023  Medication: HYDROcodone-acetaminophen (NORCO/VICODIN) 5-325 MG tablet  Has the patient contacted their pharmacy? Yes (Agent: If no, request that the patient contact the pharmacy for the refill. If patient does not wish to contact the pharmacy document the reason why and proceed with request.) (Agent: If yes, when and what did the pharmacy advise?)  Is this the correct pharmacy for this prescription? Yes If no, delete pharmacy and type the correct one.  This is the patient's preferred pharmacy:  [id number] - [place], [place] - [address] [place] [address] Phone: [phone] Fax: [phone]   Has the prescription been filled recently? No  Is the patient out of the medication? No(few pills left)  Has the patient been seen for an appointment in the last year OR does the patient have an upcoming appointment? Yes  Can we respond through MyChart? Yes  Agent: Please be advised that Rx refills may take up to 3 business days. We ask that you follow-up with your pharmacy.

## 2023-10-27 MED ORDER — HYDROCODONE-ACETAMINOPHEN 5-325 MG PO TABS: 1.0000 | ORAL_TABLET | Freq: Four times a day (QID) | ORAL | 0 refills | Status: DC | PRN

## 2023-10-30 ENCOUNTER — Ambulatory Visit (HOSPITAL_BASED_OUTPATIENT_CLINIC_OR_DEPARTMENT_OTHER)
Admission: RE | Admit: 2023-10-30 | Discharge: 2023-10-30 | Disposition: A | Discharge status: Home / Self Care | Payer: Medicare Other | Source: Ambulatory Visit | Attending: Hematology & Oncology | Admitting: Hematology & Oncology

## 2023-10-30 DIAGNOSIS — C499 Malignant neoplasm of connective and soft tissue, unspecified: Secondary | ICD-10-CM | POA: Insufficient documentation

## 2023-10-30 DIAGNOSIS — C4922 Malignant neoplasm of connective and soft tissue of left lower limb, including hip: Secondary | ICD-10-CM | POA: Diagnosis not present

## 2023-10-30 DIAGNOSIS — K439 Ventral hernia without obstruction or gangrene: Secondary | ICD-10-CM | POA: Diagnosis not present

## 2023-10-30 DIAGNOSIS — C786 Secondary malignant neoplasm of retroperitoneum and peritoneum: Secondary | ICD-10-CM | POA: Insufficient documentation

## 2023-10-30 DIAGNOSIS — N200 Calculus of kidney: Secondary | ICD-10-CM | POA: Diagnosis not present

## 2023-10-30 DIAGNOSIS — K5790 Diverticulosis of intestine, part unspecified, without perforation or abscess without bleeding: Secondary | ICD-10-CM | POA: Diagnosis not present

## 2023-10-30 MED ORDER — IOHEXOL 300 MG/ML  SOLN
100.0000 mL | Freq: Once | INTRAMUSCULAR | Status: AC | PRN
  Administered 2023-10-30: 100 mL via INTRAVENOUS

## 2023-11-03 ENCOUNTER — Ambulatory Visit: Attending: Cardiology | Admitting: Cardiology

## 2023-11-11 ENCOUNTER — Other Ambulatory Visit: Payer: Self-pay

## 2023-11-11 ENCOUNTER — Inpatient Hospital Stay: Payer: Medicare Other | Attending: Hematology & Oncology | Admitting: Hematology & Oncology

## 2023-11-11 ENCOUNTER — Inpatient Hospital Stay: Payer: Medicare Other | Attending: Hematology & Oncology

## 2023-11-11 VITALS — BP 162/59 | HR 74 | Temp 98.1°F | Resp 18 | Ht 68.0 in | Wt 200.0 lb

## 2023-11-11 DIAGNOSIS — C4922 Malignant neoplasm of connective and soft tissue of left lower limb, including hip: Secondary | ICD-10-CM | POA: Insufficient documentation

## 2023-11-11 DIAGNOSIS — C499 Malignant neoplasm of connective and soft tissue, unspecified: Secondary | ICD-10-CM

## 2023-11-11 LAB — CMP (CANCER CENTER ONLY)
ALT: 9 U/L (ref 0–44)
AST: 11 U/L — ABNORMAL LOW (ref 15–41)
Albumin: 4.3 g/dL (ref 3.5–5.0)
Alkaline Phosphatase: 74 U/L (ref 38–126)
Anion gap: 8 (ref 5–15)
BUN: 24 mg/dL — ABNORMAL HIGH (ref 8–23)
CO2: 29 mmol/L (ref 22–32)
Calcium: 9.1 mg/dL (ref 8.9–10.3)
Chloride: 100 mmol/L (ref 98–111)
Creatinine: 0.93 mg/dL (ref 0.44–1.00)
GFR, Estimated: >60 mL/min (ref >60)
Glucose, Bld: 96 mg/dL (ref 70–99)
Potassium: 4.6 mmol/L (ref 3.5–5.1)
Sodium: 137 mmol/L (ref 135–145)
Total Bilirubin: 0.6 mg/dL (ref 0.0–1.2)
Total Protein: 7.1 g/dL (ref 6.5–8.1)

## 2023-11-11 LAB — CBC WITH DIFFERENTIAL (CANCER CENTER ONLY)
Abs Immature Granulocytes: 0.02 10*3/uL (ref 0.00–0.07)
Basophils Absolute: 0.1 10*3/uL (ref 0.0–0.1)
Basophils Relative: 1 %
Eosinophils Absolute: 0.1 10*3/uL (ref 0.0–0.5)
Eosinophils Relative: 1 %
HCT: 36.6 % (ref 36.0–46.0)
Hemoglobin: 11.8 g/dL — ABNORMAL LOW (ref 12.0–15.0)
Immature Granulocytes: 0 %
Lymphocytes Relative: 21 %
Lymphs Abs: 1.5 10*3/uL (ref 0.7–4.0)
MCH: 30.2 pg (ref 26.0–34.0)
MCHC: 32.2 g/dL (ref 30.0–36.0)
MCV: 93.6 fL (ref 80.0–100.0)
Monocytes Absolute: 0.9 10*3/uL (ref 0.1–1.0)
Monocytes Relative: 12 %
Neutro Abs: 4.8 10*3/uL (ref 1.7–7.7)
Neutrophils Relative %: 65 %
Platelet Count: 256 10*3/uL (ref 150–400)
RBC: 3.91 MIL/uL (ref 3.87–5.11)
RDW: 13.9 % (ref 11.5–15.5)
WBC Count: 7.3 10*3/uL (ref 4.0–10.5)
nRBC: 0 % (ref 0.0–0.2)

## 2023-11-11 LAB — LACTATE DEHYDROGENASE: LDH: 183 U/L (ref 98–192)

## 2023-11-11 NOTE — Progress Notes (Signed)
 [facility] H[facility]ology and On[facility] Follow Up Visit  GISSELE NARDUCCI [id number] Jun 28, 1941 83 y.o. 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 Murtha is back for follow-up.  She had a CT scan that was done recently.  This did not show any evidence of metastatic disease.  She is still bothered by the left knee.  We did do an MRI of the left knee back in January.  This did not show any evidence of recurrent disease.  I suspect that she probably has quite a bit of scar tissue back there from past surgery and radiation.  She just does not want to see the orthopedic surgeon again.  I am unsure what the issue is with that.  I told her that she really does need to see him so he can do a good evaluation of the left knee and see if there is any need for any surgery.  She is having more pain behind the left knee.  There is been no cough or shortness of breath.  She has had no nausea or vomiting.  She has had no headache.  She has had no change in bowel or bladder habits.  Overall, I would say that her performance status is probably ECOG 1.     Medications:  Current Outpatient Medications:    alendronate (FOSAMAX) 70 MG tablet, TAKE 1 TABLET (70 MG TOTAL) BY MOUTH EVERY 7 DAYS WITH FULL GLASS WATER ON EMPTY STOMACH, Disp: 12 tablet, Rfl: 3   allopurinol (ZYLOPRIM) 100 MG tablet, Take 2 tablets (200 mg total) by mouth daily., Disp: 180 tablet, Rfl: 3   amiodarone (PACERONE) 200 MG tablet, TAKE 400 MG (2 TABLETS) ONE DOSE AS NEED FOR A FAST HEART RATE ,IF AFTER 12 HOURS STILL PRESENT TAKE 200 MG ( 1 TABLET) ONE DOSE FOR EACH EPISODE, Disp: 180 tablet, Rfl: 1   apixaban (ELIQUIS) 5 MG TABS tablet, Take 1 tablet (5 mg total) by mouth 2 (two) times daily., Disp: 180 tablet, Rfl: 3   Artificial Tear Solution (TEARS RENEWED OP), Apply  1 drop to eye daily as needed (dry eyes)., Disp: , Rfl:    carvedilol (COREG) 12.5 MG tablet, Take 1 tablet (12.5 mg total) by mouth 2 (two) times daily. May take an additional 12.5 mg if needed for extra palp., Disp: 180 tablet, Rfl: 3   colchicine 0.6 MG tablet, Take by mouth., Disp: , Rfl:    cyanocobalamin (VITAMIN B12) 1000 MCG/ML injection, 1000 MCG INJECTION ONCE PER MONTH. PLEASE PROVIDE SYRINGES AS WELL, Disp: 3 mL, Rfl: 5   diclofenac sodium (VOLTAREN) 1 % GEL, Apply 2 g topically 4 (four) times daily as needed (pain). (Patient not taking: Reported on 10/09/2023), Disp: , Rfl:    furosemide (LASIX) 80 MG tablet, Take 80 mg by mouth 2 (two) times daily., Disp: , Rfl:    HYDROcodone-acetaminophen (NORCO/VICODIN) 5-325 MG tablet, Take 1 tablet by mouth every 6 (six) hours as needed for moderate pain (pain score 4-6)., Disp: 30 tablet, Rfl: 0   hyoscyamine (ANASPAZ) 0.125 MG TBDP disintergrating tablet, Place 1 tablet (0.125 mg total) under the tongue every 6 (six) hours as needed (abdominal cramping). (Patient not taking: Reported on 10/09/2023), Disp: 30 tablet, Rfl: 5   indapamide (LOZOL) 2.5 MG tablet, TAKE ONE TABLET BY MOUTH DAILY, 30 MINUTES BEFORE DAILY FUROSEMIDE., Disp:  90 tablet, Rfl: 1   isosorbide mononitrate (IMDUR) 60 MG 24 hr tablet, Take 1 tablet (60 mg total) by mouth daily. May take an extra dose if you use Nitroglycerin  sublingual tablet for 2 days afterwards, Disp: 100 tablet, Rfl: 3   lidocaine (XYLOCAINE) 5 % ointment, Apply 1 application. topically as needed. (Patient not taking: Reported on 10/09/2023), Disp: 35.44 g, Rfl: 0   losartan (COZAAR) 50 MG tablet, TAKE 1 TABLET BY MOUTH EVERY DAY, Disp: 90 tablet, Rfl: 3   meclizine (ANTIVERT) 12.5 MG tablet, Take 1 tablet (12.5 mg total) by mouth 2 (two) times daily as needed for dizziness., Disp: 60 tablet, Rfl: 1   nitroGLYCERIN (NITROSTAT) 0.4 MG SL tablet, DISSOLVE 1 TABLET UNDER THE TONGUE EVERY 5 MINUTES AS NEEDED FOR CHEST  PAIN, Disp: 25 tablet, Rfl: 6   nystatin (MYCOSTATIN/NYSTOP) powder, Apply 1 Application topically 3 (three) times daily., Disp: 30 g, Rfl: 2   potassium chloride (KLOR-CON M) 10 MEQ tablet, TAKE 1 TABLET (10 MEQ TOTAL) BY MOUTH 2 (TWO) TIMES DAILY AS NEEDED (AS INSTRUCTED PER DR. HUNTER)., Disp: 90 tablet, Rfl: 1   spironolactone (ALDACTONE) 25 MG tablet, TAKE 1 TABLET BY MOUTH AS DIRECTED. TAKE 1 TABLET ON MONDAYS, WEDNESDAYS, AND FRIDAYS AT 6 PM., Disp: 45 tablet, Rfl: 3  Allergies:  Allergies  Allergen Reactions   Codeine Phosphate Anaphylaxis, Shortness Of Breath and Swelling   Contrast Media [Iodinated Contrast Media] Swelling    Swelling of the face. Reaction was to ionic contrast many years ago. Patient has had non-ionic contrast many time without pre medication and has had no reaction.    Statins Other (See Comments)    Hyperactivity   Tramadol Palpitations and Other (See Comments)    Hyperactivity    Past Medical History, Surgical history, Social history, and Family History were reviewed and updated.  Review of Systems: Review of Systems  Constitutional: Negative.   HENT:  Negative.    Eyes: Negative.   Respiratory: Negative.    Cardiovascular: Negative.   Gastrointestinal: Negative.   Endocrine: Negative.   Genitourinary: Negative.    Musculoskeletal:  Positive for arthralgias.  Neurological: Negative.   Hematological: Negative.   Psychiatric/Behavioral: Negative.      Physical Exam:  height is 5\' 8"  (1.727 m) and weight is 200 lb (90.7 kg). Her oral temperature is 98.1 F (36.7 C). Her blood pressure is 162/59 (abnormal) and her pulse is 74. Her respiration is 18 and oxygen saturation is 93%.   Wt Readings from Last 3 Encounters:  11/11/23 200 lb (90.7 kg)  10/09/23 194 lb 9.6 oz (88.3 kg)  10/01/23 200 lb (90.7 kg)    Physical Exam Vitals reviewed.  HENT:     Head: Normocephalic and atraumatic.  Eyes:     Pupils: Pupils are equal, round, and reactive to  light.  Cardiovascular:     Rate and Rhythm: Normal rate and regular rhythm.     Heart sounds: Normal heart sounds.  Pulmonary:     Effort: Pulmonary effort is normal.     Breath sounds: Normal breath sounds.  Abdominal:     General: Bowel sounds are normal.     Palpations: Abdomen is soft.  Musculoskeletal:        General: No tenderness or deformity. Normal range of motion.     Cervical back: Normal range of motion.     Comments: Behind the left knee is a surgical scar.  This is well-healed.  She has significant  radiation dermatitis.  There is couple areas of open sores.  There is no obvious discharge.  She has a little bit of swelling.    Lymphadenopathy:     Cervical: No cervical adenopathy.  Skin:    General: Skin is warm and dry.     Findings: No erythema or rash.  Neurological:     Mental Status: She is alert and oriented to person, place, and time.  Psychiatric:        Behavior: Behavior normal.        Thought Content: Thought content normal.        Judgment: Judgment normal.      Lab Results  Component Value Date   WBC 7.3 11/11/2023   HGB 11.8 (L) 11/11/2023   HCT 36.6 11/11/2023   MCV 93.6 11/11/2023   PLT 256 11/11/2023     Chemistry      Component Value Date/Time   NA 137 11/11/2023 1148   NA 136 04/29/2022 1230   K 4.6 11/11/2023 1148   CL 100 11/11/2023 1148   CO2 29 11/11/2023 1148   BUN 24 (H) 11/11/2023 1148   BUN 30 (H) 04/29/2022 1230   CREATININE 0.93 11/11/2023 1148   CREATININE 0.90 10/24/2016 1127      Component Value Date/Time   CALCIUM 9.1 11/11/2023 1148   ALKPHOS 74 11/11/2023 1148   AST 11 (L) 11/11/2023 1148   ALT 9 11/11/2023 1148   BILITOT 0.6 11/11/2023 1148       Impression and Plan: Ms. Moravek is a very charming 83 year old white female.  She has a sarcoma behind the left knee in the popliteal fossa.  This is a fairly high-grade sarcoma.  As such, she had this resected.  Her margins were positive.  We have given her  radiotherapy.  She completed this about 6-7 months ago.  It will be interesting to see what the MRI has to show.  Hopefully, this will give [place] an idea as to the status of her radiation that she took.  When we see her again, we will I will have to get a chest x-ray to make sure there is nothing going on with her chest.  We will go ahead and plan to get her back to see [place] in another 6 weeks.    Josph Macho, MD 3/11/20251:35 PM

## 2023-11-13 ENCOUNTER — Other Ambulatory Visit: Payer: Self-pay | Admitting: Family Medicine

## 2023-11-13 ENCOUNTER — Ambulatory Visit (INDEPENDENT_AMBULATORY_CARE_PROVIDER_SITE_OTHER): Payer: Medicare Other | Admitting: Family Medicine

## 2023-11-13 ENCOUNTER — Encounter: Payer: Self-pay | Admitting: Family Medicine

## 2023-11-13 ENCOUNTER — Other Ambulatory Visit: Payer: Self-pay | Admitting: Cardiology

## 2023-11-13 VITALS — BP 132/64 | HR 85 | Temp 97.4°F | Ht 68.0 in | Wt 200.0 lb

## 2023-11-13 DIAGNOSIS — R310 Gross hematuria: Secondary | ICD-10-CM | POA: Diagnosis not present

## 2023-11-13 DIAGNOSIS — Z87442 Personal history of urinary calculi: Secondary | ICD-10-CM

## 2023-11-13 DIAGNOSIS — E1142 Type 2 diabetes mellitus with diabetic polyneuropathy: Secondary | ICD-10-CM

## 2023-11-13 DIAGNOSIS — G720 Drug-induced myopathy: Secondary | ICD-10-CM | POA: Diagnosis not present

## 2023-11-13 DIAGNOSIS — R32 Unspecified urinary incontinence: Secondary | ICD-10-CM | POA: Diagnosis not present

## 2023-11-13 LAB — POCT GLYCOSYLATED HEMOGLOBIN (HGB A1C): Hemoglobin A1C: 5.9 % — AB (ref 4.0–5.6)

## 2023-11-13 NOTE — Patient Instructions (Addendum)
 Get diabetic eye exam scheduled.  Call [facility] if you don't hear within a week  Please stop by lab before you go If you have mychart- we will send your results within 3 business days of [place] receiving them.  If you do not have mychart- we will call you about results within 5 business days of [place] receiving them.  *please also note that you will see labs on mychart as soon as they post. I will later go in and write notes on them- will say "notes from Dr. Durene Cal"   Recommended follow up: Return in about 14 weeks (around 02/19/2024) for followup or sooner if needed.Schedule b4 you leave.

## 2023-11-13 NOTE — Progress Notes (Signed)
 Phone [phone] In person visit   Subjective:   [name] is a 83 y.o. year old very pleasant female patient who presents for/with See problem oriented charting Chief Complaint  Patient presents with   Diabetes    Pt here for 14 week f/u    Nephrolithiasis    Pt c/o possibly passing a kidney stone.    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    Priority: High   Osteoporosis 01/22/2022    Priority: High   Pacemaker 07/09/2021    Priority: High   Mobitz type 2 second degree atrioventricular block 06/20/2021    Priority: High   PAT (paroxysmal atrial tachycardia) (HCC) 06/20/2021    Priority: High   PAF (paroxysmal atrial fibrillation) (HCC) 10/16/2020    Priority: High   PAD (peripheral artery disease) (HCC) 01/14/2017    Priority: High   Type 2 diabetes mellitus with peripheral neuropathy (HCC) 10/09/2016    Priority: High   Mesenteric artery stenosis (HCC) 04/20/2014    Priority: High   Abdominal pain, epigastric 01/19/2014    Priority: High   Congestive heart failure with LV diastolic dysfunction, NYHA class 2 (HCC)     Priority: High   Celiac artery stenosis: 60% by Duplex - 90-95% by cath - Med Rx 11/27/2013    Priority: High   Renal artery stenosis (HCC) 08/29/2012    Priority: High   Coronary artery disease involving native coronary artery of native heart with angina pectoris (HCC) 10/30/2011    Priority: High   CAD S/P percutaneous coronary angioplasty: pRCA BMS, mLAD BMS overlapped prox with DES for ISR 07/30/2010    Priority: High   B12 deficiency 01/10/2022    Priority: Medium    Continuous severe abdominal pain 10/11/2016    Priority: Medium    Exertional dyspnea 03/16/2015    Priority: Medium    Idiopathic chronic pancreatitis suspected 03/10/2014    Priority: Medium    Abnormal CT of liver-possible cirrhosis 03/10/2014    Priority: Medium    Stasis edema of  bilateral lower extremity     Priority: Medium    Essential hypertension     Priority: Medium    Hyperlipidemia associated with type 2 diabetes mellitus (HCC)     Priority: Medium    Myalgia due to statin 12/29/2011    Priority: Medium    GERD (gastroesophageal reflux disease) 01/24/2009    Priority: Medium    Hypothyroid 11/12/2007    Priority: Medium    Irritable bowel syndrome 11/12/2007    Priority: Medium    PANCREAS DIVISUM 11/12/2007    Priority: Medium    Allergic rhinitis 01/10/2022    Priority: Low   Pneumothorax 07/10/2021    Priority: Low   Syncope and collapse 06/20/2021    Priority: Low   Hallux valgus (acquired), right foot 08/17/2018    Priority: Low   Costochondritis 01/21/2017    Priority: Low   Onychomycosis due to dermatophyte 11/16/2015    Priority: Low   Abnormal electrocardiogram during exercise stress test 03/15/2015    Priority: Low   Dysphagia 03/10/2014    Priority: Low   Carotid stenosis 02/27/2014    Priority: Low   Leg cramps 01/19/2014    Priority: Low   Palpitations 01/12/2014    Priority: Low   Diarrhea due to malabsorption 01/24/2009    Priority: Low   Renal calculus 11/12/2007    Priority: Low  HIATAL HERNIA 11/20/2005    Priority: Low   Hypercoagulable state due to paroxysmal atrial fibrillation (HCC) 10/01/2023   Nodular fasciitis 12/20/2022    Medications- reviewed and updated Current Outpatient Medications  Medication Sig Dispense Refill   alendronate (FOSAMAX) 70 MG tablet TAKE 1 TABLET (70 MG TOTAL) BY MOUTH EVERY 7 DAYS WITH FULL GLASS WATER ON EMPTY STOMACH 12 tablet 3   allopurinol (ZYLOPRIM) 100 MG tablet TAKE 2 TABLETS BY MOUTH EVERY DAY 180 tablet 3   amiodarone (PACERONE) 200 MG tablet TAKE 400 MG (2 TABLETS) ONE DOSE AS NEED FOR A FAST HEART RATE ,IF AFTER 12 HOURS STILL PRESENT TAKE 200 MG ( 1 TABLET) ONE DOSE FOR EACH EPISODE 180 tablet 1   apixaban (ELIQUIS) 5 MG TABS tablet Take 1 tablet (5 mg total) by mouth  2 (two) times daily. 180 tablet 3   Artificial Tear Solution (TEARS RENEWED OP) Apply 1 drop to eye daily as needed (dry eyes).     carvedilol (COREG) 12.5 MG tablet Take 1 tablet (12.5 mg total) by mouth 2 (two) times daily. May take an additional 12.5 mg if needed for extra palp. 180 tablet 3   colchicine 0.6 MG tablet Take by mouth.     cyanocobalamin (VITAMIN B12) 1000 MCG/ML injection 1000 MCG INJECTION ONCE PER MONTH. PLEASE PROVIDE SYRINGES AS WELL 3 mL 5   diclofenac sodium (VOLTAREN) 1 % GEL Apply 2 g topically 4 (four) times daily as needed (pain).     furosemide (LASIX) 80 MG tablet TAKE 1 TABLET(80 MG) BY MOUTH ONCE A DAY , MAY TAKE AN ADDITIONAL 1 TABLET IF NEEDED FOR SWELLING 180 tablet 3   hyoscyamine (ANASPAZ) 0.125 MG TBDP disintergrating tablet PLACE 1 TABLET (0.125 MG TOTAL) UNDER THE TONGUE EVERY 6 (SIX) HOURS AS NEEDED (ABDOMINAL CRAMPING). 7 tablet 5   indapamide (LOZOL) 2.5 MG tablet TAKE ONE TABLET BY MOUTH DAILY, 30 MINUTES BEFORE DAILY FUROSEMIDE. 90 tablet 1   isosorbide mononitrate (IMDUR) 60 MG 24 hr tablet Take 1 tablet (60 mg total) by mouth daily. May take an extra dose if you use Nitroglycerin  sublingual tablet for 2 days afterwards 100 tablet 3   lidocaine (XYLOCAINE) 5 % ointment Apply 1 application. topically as needed. 35.44 g 0   losartan (COZAAR) 50 MG tablet TAKE 1 TABLET BY MOUTH EVERY DAY 90 tablet 3   nitroGLYCERIN (NITROSTAT) 0.4 MG SL tablet DISSOLVE 1 TABLET UNDER THE TONGUE EVERY 5 MINUTES AS NEEDED FOR CHEST PAIN 25 tablet 6   nystatin (MYCOSTATIN/NYSTOP) powder Apply 1 Application topically 3 (three) times daily. 30 g 2   potassium chloride (KLOR-CON M) 10 MEQ tablet TAKE 1 TABLET (10 MEQ TOTAL) BY MOUTH 2 (TWO) TIMES DAILY AS NEEDED (AS INSTRUCTED PER DR. Sherran Margolis). 90 tablet 1   spironolactone (ALDACTONE) 25 MG tablet TAKE 1 TABLET BY MOUTH AS DIRECTED. TAKE 1 TABLET ON MONDAYS, WEDNESDAYS, AND FRIDAYS AT 6 PM. 45 tablet 3   HYDROcodone-acetaminophen  (NORCO/VICODIN) 5-325 MG tablet Take 1 tablet by mouth every 6 (six) hours as needed for moderate pain (pain score 4-6). (Patient not taking: Reported on 11/13/2023) 30 tablet 0   meclizine (ANTIVERT) 12.5 MG tablet Take 1 tablet (12.5 mg total) by mouth 2 (two) times daily as needed for dizziness. (Patient not taking: Reported on 11/13/2023) 60 tablet 1   No current facility-administered medications for this visit.     Objective:  BP 132/64   Pulse 85   Temp (!) 97.4 F (  36.3 C)   Ht 5\' 8"  (1.727 m)   Wt 200 lb (90.7 kg)   SpO2 96%   BMI 30.41 kg/m  Gen: NAD, resting comfortably CV: RRR no murmurs rubs or gallops Lungs: CTAB no crackles, wheeze, rhonchi Ext: trace edema Skin: warm, dry     Assessment and Plan   #kidney stone/gross hematuria- passed one a few weeks ago and noted some blood after it. Then has noted some left flank pain that comes and goes but can be rather intense.  -looking back at CT had a stone on the right on 10/30/23 with none noted on the left side.  - on exam pain is in the low back well below CVA area and is tender to palpation- suspect musculoskeletal strain - had seen Dr. Pete Glatter [facility]- will refer her back due to blood in urine and for his opinion on if she needs repeat imaging for left sided pain though no stone noted within 3-4 weeks. Also getting a yellow discharge from urethra/incontinence  # Primary myxofibrosarcoma-just had follow-up with Dr. Myna Hidalgo on 04/09/2023 -Prior resection with margins positive and had radiotherapy.  Unfortunately high-grade.  Pending CT scan and aware of risk of recurrence -Has completed radiation  -MRI of the knee ordered in March 2025   #Chronic abdominal pain-ongoing- uses hydrocodone as needed. Can refill as needed but thankfully not overly bothersome today  #CAD with angina- on imdur- despite PCI x2 (last 2011) #hyperlipidemia but with myalgias related to statins #Mobitz type 2  second degree AV block s/p pacemaker November 2022- stable but has to get checked as not communicating well S: Medication: imdur 30 mg, coreg 12.5  mg BID, on eliquis 5 mg  BID . Has had to use some nitroglycerin for chest pain  -plavix discontinued - no cholesterol treatment as of 2023-statin intolerant and failed other lipid management - has not tried pcsk9 inhibitors Lab Results  Component Value Date   CHOL 229 (H) 01/10/2022   HDL 36.90 (L) 01/10/2022   LDLCALC 152 (H) 09/08/2019   LDLDIRECT 147.0 01/10/2022   TRIG 327.0 (H) 01/10/2022   CHOLHDL 6 01/10/2022   A/P: coronary artery disease with stable angina- even with imdur still needs nitroglycerin and its helpful- continue current medications and keep cardiology follow up  -lipids above goal but doesn't tolerate medicine for this- wants to hold off as just had yesterday with Dr. Myna Hidalgo  # Atrial fibrillation- discovered by pacemaker interrogation (very symptomatic) S: Rate controlled on coreg 12.5 mg BID - on amiodarone 400 mg with fast HR, additional 200 12 hours later if persists per prescription- not needing Anticoagulated with eliquis 5 mg BID  A/P: appropriately anticoagulated and rate controlled- continue current medicine    # Diabetes S: Medication: Diet controlled Lab Results  Component Value Date   HGBA1C 5.9 (A) 11/13/2023   HGBA1C 6.0 (A) 08/07/2023   HGBA1C 6.1 (A) 05/01/2023  A/P: well controlled - continue without medicine -Urine microalbumin to creatinine ratio lab error discussed- recheck today to recalculate- may need to go up on losartan- but biggest hope is that its stable    #CHF diastolic, NYHA class III -also with venous stasis edema- wears compression #hypertension S: Medication:  lasix 80 mg (BID if needed for edema- most of the time), indapaminde 2.5 mg, imdur 30 mg, losartan 50 mg, spironolactone 25 mg, carvedilol 12.5 mg twice daily  Edema: stable to mildly improved but worse as day goes on Weight  gain:up  4 lbs from last visit here Shortness of breath:  baseline is exertional dyspnea if overdose it BP Readings from Last 3 Encounters:  11/13/23 132/64  11/11/23 (!) 162/59  10/09/23 (!) 143/62  A/P: hypertension well controlled continue current medications CHF appears stable- weight up some but was through holidays and think that contributed   # Osteoporosis- order DEXA next visit  Recommended follow up: Return in about 14 weeks (around 02/19/2024) for followup or sooner if needed.Schedule b4 you leave. Future Appointments  Date Time Provider Department Center  12/15/2023  3:00 PM Antony Blackbird, MD [facility] None  12/16/2023 12:15 PM [facility] None  12/16/2023 12:30 PM Ennever, Rose Phi, MD [facility] None  01/09/2024  7:00 AM [facility] DEVICE REMOTES [facility]  02/23/2024  4:00 PM Marykay Lex, MD [facility] None    Lab/Order associations:   ICD-10-CM   1. Type 2 diabetes mellitus with peripheral neuropathy (HCC)  E11.42 POCT HgB A1C    Urine Microalbumin w/creat. ratio    2. Urinary incontinence, unspecified type  R32 Urine Culture    Urinalysis, Routine w reflex microscopic    3. Gross hematuria  R31.0 Ambulatory referral to Urology    4. History of kidney stones  Z87.442 Ambulatory referral to Urology    5. Drug-induced myopathy  G72.0       No orders of the defined types were placed in this encounter.   Return precautions advised.  Tana Conch, MD

## 2023-11-14 ENCOUNTER — Encounter: Payer: Self-pay | Admitting: Family Medicine

## 2023-11-14 LAB — URINALYSIS, ROUTINE W REFLEX MICROSCOPIC
Bilirubin Urine: NEGATIVE
Hgb urine dipstick: NEGATIVE
Ketones, ur: NEGATIVE
Nitrite: NEGATIVE
Specific Gravity, Urine: 1.01 (ref 1.000–1.030)
Total Protein, Urine: NEGATIVE
Urine Glucose: NEGATIVE
Urobilinogen, UA: 0.2 (ref 0.0–1.0)
pH: 6.5 (ref 5.0–8.0)

## 2023-11-14 LAB — URINE CULTURE
MICRO NUMBER:: 16197619
Result:: NO GROWTH
SPECIMEN QUALITY:: ADEQUATE

## 2023-11-14 LAB — MICROALBUMIN / CREATININE URINE RATIO
Creatinine,U: 54 mg/dL
Microalb Creat Ratio: UNDETERMINED mg/g (ref 0.0–30.0)
Microalb, Ur: <0.7 mg/dL

## 2023-11-17 ENCOUNTER — Encounter: Payer: Self-pay | Admitting: Family Medicine

## 2023-11-18 ENCOUNTER — Other Ambulatory Visit: Payer: Self-pay

## 2023-11-18 MED ORDER — PREDNISONE 50 MG PO TABS: ORAL_TABLET | ORAL | 0 refills | Status: DC

## 2023-11-19 ENCOUNTER — Encounter: Payer: Self-pay | Admitting: Urology

## 2023-11-19 ENCOUNTER — Ambulatory Visit (INDEPENDENT_AMBULATORY_CARE_PROVIDER_SITE_OTHER): Admitting: Urology

## 2023-11-19 VITALS — BP 139/78 | HR 82 | Ht 68.0 in | Wt 196.0 lb

## 2023-11-19 DIAGNOSIS — N200 Calculus of kidney: Secondary | ICD-10-CM | POA: Diagnosis not present

## 2023-11-19 DIAGNOSIS — R310 Gross hematuria: Secondary | ICD-10-CM | POA: Insufficient documentation

## 2023-11-19 LAB — URINALYSIS, ROUTINE W REFLEX MICROSCOPIC
Bilirubin, UA: NEGATIVE
Glucose, UA: NEGATIVE
Ketones, UA: NEGATIVE
Nitrite, UA: NEGATIVE
Protein,UA: NEGATIVE
RBC, UA: NEGATIVE
Specific Gravity, UA: 1.01 (ref 1.005–1.030)
Urobilinogen, Ur: 0.2 mg/dL (ref 0.2–1.0)
pH, UA: 6 (ref 5.0–7.5)

## 2023-11-19 LAB — MICROSCOPIC EXAMINATION

## 2023-11-19 NOTE — Progress Notes (Signed)
 Assessment: 1. Gross hematuria   2. Nephrolithiasis     Plan: I personally reviewed the patient's chart including provider notes, lab and imaging results. I reviewed the CT scan from 11/08/2023 with results as noted below.  A nonobstructing right renal calculus is noted.  This appears to be unchanged from prior studies from 2021.  There is no evidence of any left sided nephrolithiasis. Today I had a discussion with the patient regarding the findings of gross hematuria including the implications and differential diagnoses associated with it.  I also discussed recommendations for further evaluation including the rationale for upper tract imaging and cystoscopy.  I discussed the nature of these procedures including potential risk and complications.  The patient expressed an understanding of these issues. Schedule for further evaluation with cystoscopy.   Chief Complaint:  Chief Complaint  Patient presents with   Hematuria    History of Present Illness:  [name] is a 83 y.o. female who is seen in consultation from Shelva Majestic, MD for evaluation of gross hematuria and left flank pain. She has a history of nephrolithiasis and was previously followed at [facility] in [facility].  Her last visit was in November 2021.  At that time, she was found to have a right renal calculus. She has recently had acute onset of left-sided flank pain and gross hematuria.  She thinks she may have passed a stone.  The hematuria resolved after 1 void.  She has continued to have some left-sided flank and back pain.  This is worse with movement and certain positions.  No dysuria.  She does have baseline symptoms of frequency, urgency, nocturia, and incontinence.  Urinalysis from 11/13/2023 showed 3-6 WBCs, 0-2 RBCs, and rare bacteria. Urine culture showed no growth. CT abdomen and pelvis with contrast from 11/08/2023 showed a nonobstructing right renal calculus.  She is currently followed for  myxofibrosarcoma of the left popliteal fossa and is status post radiation therapy.  Past Medical History:  Past Medical History:  Diagnosis Date   Adrenal adenoma    Arthritis    "fingers, right shoulder" (09/09/2017)   Atrial fibrillation (HCC) 12/2021   New diagnosis noted on pacemaker interrogation.   Back pain with radiation    Chronic Back Pain - mutliple surgeries (including tumor removal)   Bilateral edema of lower extremity    Chronic, likely related to venous stasis   Bradycardia    Pacemaker placed   CAD S/P percutaneous coronary angioplasty    a) LHC: 07/30/10. -- 3.0x30mm Integrity BMS to pRCA & 2.5x 15mm Integrity BMS mLAD(@ D2).  b) Class III-IV Angina 03/2011: LHC- ISR in LAD BMS -- prox overlapping Promus DES 2.5x59mm and PTCA of jailed D2 ostium-prox 80%. c) 02/03/12:  LHC- patent stents.  Jailed diagonal. with stable flow; d) Peri-OP NSTEMI 04/2012 - LHC in 12/'13 -    Celiac artery stenosis (HCC)    12/2016 - STENT placement   Complication of anesthesia    "used to wake up wild years ago" (09/09/2017)   Congestive heart failure with LV diastolic dysfunction, NYHA class 2 (HCC)    06/13/10:  last 2D echo-  EF >55%, Mild TR, Mod Conc LVH - Grade 1 diastolic dysfunction (abnormal relaxation) --> LVEDP on Cath 28 mmHg & mild 2nd Pulm HTN   Diet-controlled type 2 diabetes mellitus (HCC)    Diverticulitis of colon (without mention of hemorrhage)(562.11)    Diverticulosis    Dyslipidemia, goal LDL below 70  Intolerant to statins   GERD (gastroesophageal reflux disease)    Hepatitis ~ 1957   "yellow jaundice" (01/14/2017)   Hepatomegaly    Hiatal hernia    History of blood transfusion 04/2012   "when I had a heart attack"   History of kidney stones    "I've got a stone embedded in one of my kidneys" (09/09/2017)   History of radiation therapy    Left Knee - 03/05/23-04/30/23- Dr. Antony Blackbird   History of stomach ulcers "years ago"   Irritable bowel syndrome (IBS)    Labile  essential hypertension    Partially related to RAS   Mesenteric artery stenosis (HCC)    95% Celiac Artery - ostial, 20-30% SMA.  Bilateral Renal A: L RA stent patent, R RA 20-30% -- Conservative Management   Mobitz type 2 second degree AV block    Status post PPM placement   NSTEMI (non-ST elevated myocardial infarction) (HCC) 04/2012   Unclear the details, apparently this was postoperative from her back surgery that she was cleared for my last saw her in June.  Reportedly had stents placed   Pancreas divisum    On pancreatic enzyme   PAT (paroxysmal atrial tachycardia) (HCC)    Renal artery stenosis (HCC) 2011; 12/'13   a) Angiogram 02/03/12:  50-60%L RA stenosis, 40% R R Inferior artery; b) 12/'13: S/P L RA Stent ([facility]) 6.0 mm x 15 mm; c) Renal Duplex 10/2013: <60% L RA, <60 R RA, ~60% SMA & Celiac A.   S/P placement of cardiac pacemaker 07/2021   [employer]   Stricture and stenosis of esophagus    Thyroid nodule    Unspecified gastritis and gastroduodenitis without mention of hemorrhage     Past Surgical History:  Past Surgical History:  Procedure Laterality Date   APPENDECTOMY     BACK SURGERY     BREAST DUCTAL SYSTEM EXCISION     right   BREAST SURGERY Right    CARDIAC CATHETERIZATION N/A 03/16/2015   Procedure: Left Heart Cath and Coronary Angiography;  Surgeon: Marykay Lex, MD;  Location: [facility] INVASIVE CV LAB;  Service: Cardiovascular; Widely patent m-dLAD stents as wellas pRCA stent.  High LVEDP, small Diag & Om vessels with moderate stenosis    Cardiac Event Monitor  01/2017   Mostly NSR with occasional sinus tachycardia and rare bradycardia. No A. fib. No PND or PSVT. Rare PACs and PVCs.   CATARACT EXTRACTION W/ INTRAOCULAR LENS  IMPLANT, BILATERAL Bilateral    CHOLECYSTECTOMY OPEN     COLONOSCOPY  03/01/2009   normal    CORONARY ANGIOPLASTY WITH STENT PLACEMENT  07/30/2010   3.0x8mm Integrity BMS to RCA and 2.5x 15mm Integrity BMS LAD.     CORONARY ANGIOPLASTY  WITH STENT PLACEMENT  03/18/2011   Cutting Balloon PTCA of D2 (jailed - 80% ostial stenosis), DES PCI of mid LAD ISR - > Promus DES 2.5 x 16 mm postdilated to 2.8 mm) covering the proximal portion of the previous stent   DOBUTAMINE STRESS ECHO  03/07/2015   [facility] (ordered for pre-op evaluation for EUS/ERCP --> abnormal EKG:  strreesss test shhoowwed 1 mm ST segment depressions downsloping. No wall motion abnormalities at peak exercise or at rest. Diastolic dysfunction was noted but normal systolic function - EF greater than 55%. No bouts of regurgitation or stenosis. Resting hypertension with exaggerated response   ESOPHAGOGASTRODUODENOSCOPY  04/08/2012   ESOPHAGOGASTRODUODENOSCOPY (EGD) WITH ESOPHAGEAL DILATION  X 2   FRACTURE SURGERY  HAMMER TOE SURGERY Bilateral    "took bone off the top of 2nd toe on each foot"   HIP SURGERY Left    "something to do w/my back"   JOINT REPLACEMENT     KNEE SURGERY Left    "had fluid drained off it a couple times"   LEFT HEART CATH AND CORONARY ANGIOGRAPHY  07/30/2010   severe LAD-diagonal 80%, moderate to severe proximal RCA.  Mean PAP 15 mmHg.  PCWP mean 17 mmHg.  RVP 45/11 mmHg.  PAP 47/24 mmHg, mean 31 mmHg.  LVEDP 27 mmHg   LEFT HEART CATHETERIZATION WITH CORONARY ANGIOGRAM N/A 02/03/2012   Procedure: LEFT HEART CATHETERIZATION WITH CORONARY ANGIOGRAM;  Surgeon: Marykay Lex, MD;  Location: [facility] CATH LAB; widely patent LAD and RCA stents.  Patent D2 ostium.  Moderate L Renal A stenosis (56%), R Renal A 40-50% t.  LVEDP 20 mmHg.   LEFT HEART CATHETERIZATION WITH CORONARY ANGIOGRAM N/A 05/12/2014   Procedure: LEFT HEART CATHETERIZATION WITH CORONARY ANGIOGRAM;  Surgeon: Runell Gess, MD;  Location: [facility] CATH LAB;  Service: Cardiovascular: Stable CAD. Patent stents. Patent renal artery stent   LEFT HEART CATHETERIZATION WITH CORONARY ANGIOGRAM  08/2012   Peri-Op MI @ [facility] -- 40% ostial D1, patent LAD stents, 10% RCA ISR   LEFT HEART  CATHETERIZATION WITH CORONARY ANGIOGRAM   03/18/2011   70% ISR of LAD stent just after D2 (D2 has ostial 80 to 90% stenosis.  20 to 30% ISR RCA.  EDP elevated at 28 mmHg   NM MYOVIEW LTD  12/2016   LOW RISK study. No ischemia or infarction. EF 65-75%.   NM MYOVIEW LTD  01/05/2020    LOW RISK. EF 60-65%.  No EKG changes.  No infarct, no ischemia.   PACEMAKER IMPLANT N/A 07/09/2021   Procedure: PACEMAKER IMPLANT;  Surgeon: Thurmon Fair, MD;  Location: [facility] INVASIVE CV LAB;  Service: Cardiovascular;  Laterality: N/A;   PANCREAS SURGERY     "stent in my pancreas"; Dr Woodroe Chen   PERCUTANEOUS PINNING PHALANX FRACTURE OF HAND Left ~ 2013   PERIPHERAL VASCULAR BALLOON ANGIOPLASTY  09/09/2017   Procedure: PERIPHERAL VASCULAR BALLOON ANGIOPLASTY;  Surgeon: Nada Libman, MD;  Location: [facility] INVASIVE CV LAB;  Service: Cardiovascular;;  Celiac instent   PERIPHERAL VASCULAR INTERVENTION  01/14/2017   Procedure: Peripheral Vascular Intervention;  Surgeon: Nada Libman, MD;  Location: [facility] INVASIVE CV LAB;  Service: Cardiovascular;;  mesentric   RENAL ARTERY STENT Left 08/2012   @ [facility] - 6.0 mm x 15 mm   SPINE SURGERY     tumor removed 07/2010; Redo Surgery 04/2012; Sacroiliac Sgx 08/2013   TOTAL ABDOMINAL HYSTERECTOMY     TOTAL KNEE ARTHROPLASTY Left ~ 2012   TRANSTHORACIC ECHOCARDIOGRAM  07/10/2021   (Postop PPM): Normal LV size and function.  EF 60 to 65%.  No RWMA.  GR 1 DD.  Mild LA dilation.  Normal RV size and function.  Normal RAP.  Normal aortic and mitral valves.  No pericardial effusion.   VISCERAL ANGIOGRAM N/A 05/12/2014   Procedure: VISCERAL ANGIOGRAM;  Surgeon: Runell Gess, MD;  Location: [facility] CATH LAB;  25% ostial proximal celiac artery with downward takeoff.  23% proximal SMA and 56% proximal IMA.  Left renal artery stent widely patent.  Right renal artery is 20 to 30% proximal stenosis.   VISCERAL ANGIOGRAPHY N/A 01/14/2017   Procedure: Mesenteric  Angiography;   Surgeon: Nada Libman, MD;  Location: [facility] INVASIVE CV LAB;  Service: Cardiovascular;  Laterality: N/A;   VISCERAL ANGIOGRAPHY N/A 09/09/2017   Procedure: VISCERAL ANGIOGRAPHY;  Surgeon: Nada Libman, MD;  Location: [facility] INVASIVE CV LAB;  Service: Cardiovascular;  Laterality: N/A;    Allergies:  Allergies  Allergen Reactions   Codeine Phosphate Anaphylaxis, Shortness Of Breath and Swelling   Contrast Media [Iodinated Contrast Media] Swelling    Swelling of the face. Reaction was to ionic contrast many years ago. Patient has had non-ionic contrast many time without pre medication and has had no reaction.    Statins Other (See Comments)    Hyperactivity   Tramadol Palpitations and Other (See Comments)    Hyperactivity    Family History:  Family History  Problem Relation Age of Onset   Stroke Mother    Cancer Father        mets   Heart attack Father    Heart disease Father    Heart attack Brother    Diabetes Brother    Heart attack Brother    Colitis Maternal Grandfather    Cancer Daughter    Colon cancer Neg Hx    Stomach cancer Neg Hx     Social History:  Social History   Tobacco Use   Smoking status: Never   Smokeless tobacco: Never  Vaping Use   Vaping status: Never Used  Substance Use Topics   Alcohol use: No   Drug use: No    Review of symptoms:  Constitutional:  Negative for unexplained weight loss, night sweats, fever, chills ENT:  Negative for nose bleeds, sinus pain, painful swallowing CV:  Negative for chest pain, shortness of breath, exercise intolerance, palpitations, loss of consciousness Resp:  Negative for cough, wheezing, shortness of breath GI:  Negative for nausea, vomiting, diarrhea, bloody stools GU:  Positives noted in HPI; otherwise negative for dysuria, urinary incontinence Neuro:  Negative for seizures, poor balance, limb weakness, slurred speech Psych:  Negative for lack of energy, depression, anxiety Endocrine:  Negative for  polydipsia, polyuria, symptoms of hypoglycemia (dizziness, hunger, sweating) Hematologic:  Negative for anemia, purpura, petechia, prolonged or excessive bleeding, use of anticoagulants  Allergic:  Negative for difficulty breathing or choking as a result of exposure to anything; no shellfish allergy; no allergic response (rash/itch) to materials, foods  Physical exam: BP 139/78   Pulse 82   Ht 5\' 8"  (1.727 m)   Wt 196 lb (88.9 kg)   BMI 29.80 kg/m  GENERAL APPEARANCE:  Well appearing, well developed, well nourished, NAD HEENT: Atraumatic, Normocephalic, oropharynx clear. NECK: Supple without lymphadenopathy or thyromegaly. LUNGS: Clear to auscultation bilaterally. HEART: Regular Rate and Rhythm without murmurs, gallops, or rubs. ABDOMEN: Soft, non-tender, No Masses. EXTREMITIES: Moves all extremities well.  Without clubbing, cyanosis, or edema. NEUROLOGIC:  Alert and oriented x 3, normal gait, CN II-XII grossly intact.  MENTAL STATUS:  Appropriate. BACK:  Non-tender to palpation.  No CVAT SKIN:  Warm, dry and intact.    Results: U/A: 0-5 WBC, 0-2 RBC, few bacteria

## 2023-11-20 ENCOUNTER — Encounter: Payer: Medicare Other | Admitting: Cardiovascular Disease

## 2023-11-28 ENCOUNTER — Other Ambulatory Visit: Payer: Self-pay | Admitting: Family Medicine

## 2023-11-28 DIAGNOSIS — H8110 Benign paroxysmal vertigo, unspecified ear: Secondary | ICD-10-CM

## 2023-12-15 ENCOUNTER — Ambulatory Visit: Admitting: Urology

## 2023-12-15 ENCOUNTER — Ambulatory Visit
Admission: RE | Admit: 2023-12-15 | Discharge: 2023-12-15 | Disposition: A | Discharge status: Home / Self Care | Payer: Self-pay | Source: Ambulatory Visit | Attending: Radiation Oncology | Admitting: Radiation Oncology

## 2023-12-15 ENCOUNTER — Encounter: Payer: Self-pay | Admitting: Urology

## 2023-12-15 VITALS — BP 159/82 | HR 86 | Ht 68.0 in | Wt 196.0 lb

## 2023-12-15 VITALS — BP 156/61 | HR 87 | Temp 97.3°F | Resp 20 | Ht 68.0 in | Wt 203.8 lb

## 2023-12-15 DIAGNOSIS — Z79899 Other long term (current) drug therapy: Secondary | ICD-10-CM | POA: Insufficient documentation

## 2023-12-15 DIAGNOSIS — Z923 Personal history of irradiation: Secondary | ICD-10-CM | POA: Insufficient documentation

## 2023-12-15 DIAGNOSIS — N200 Calculus of kidney: Secondary | ICD-10-CM | POA: Diagnosis not present

## 2023-12-15 DIAGNOSIS — R310 Gross hematuria: Secondary | ICD-10-CM

## 2023-12-15 DIAGNOSIS — C499 Malignant neoplasm of connective and soft tissue, unspecified: Secondary | ICD-10-CM

## 2023-12-15 DIAGNOSIS — C4922 Malignant neoplasm of connective and soft tissue of left lower limb, including hip: Secondary | ICD-10-CM | POA: Diagnosis not present

## 2023-12-15 DIAGNOSIS — Z7901 Long term (current) use of anticoagulants: Secondary | ICD-10-CM | POA: Diagnosis not present

## 2023-12-15 LAB — URINALYSIS
Bilirubin, UA: NEGATIVE
Glucose, UA: NEGATIVE
Ketones, UA: NEGATIVE
Leukocytes,UA: NEGATIVE
Nitrite, UA: NEGATIVE
Protein,UA: NEGATIVE
RBC, UA: NEGATIVE
Specific Gravity, UA: 1.013 (ref 1.005–1.030)
Urobilinogen, Ur: 0.2 mg/dL (ref 0.2–1.0)
pH, UA: 6.5 (ref 5.0–7.5)

## 2023-12-15 MED ORDER — CIPROFLOXACIN HCL 500 MG PO TABS
500.0000 mg | ORAL_TABLET | Freq: Once | ORAL | Status: AC
  Administered 2023-12-15: 500 mg via ORAL

## 2023-12-15 NOTE — Progress Notes (Signed)
 Assessment: 1. Gross hematuria   2. Nephrolithiasis     Plan: Urine cytology sent today Cipro x 1 following cystoscopy I discussed the cystoscopy with the patient and her husband today.  I also reviewed the prior CT scan. I do not see any serious or life-threatening cause for the gross hematuria.  I also do not see any urologic explanation for her left-sided flank pain.  I think this is likely musculoskeletal in nature. Return to office in 2 months  Chief Complaint:  Chief Complaint  Patient presents with   Hematuria    History of Present Illness:  [name] is a 83 y.o. female who is seen for further evaluation of gross hematuria and left flank pain. She has a history of nephrolithiasis and was previously followed at [facility] health urology in [facility].  Her last visit was in November 2021.  At that time, she was found to have a right renal calculus. She recently had acute onset of left-sided flank pain and gross hematuria.  She thinks she may have passed a stone.  The hematuria resolved after 1 void.  She continued to have some left-sided flank and back pain, worse with movement and certain positions.  No dysuria.  She reported baseline symptoms of frequency, urgency, nocturia, and incontinence.  Urinalysis from 11/13/2023 showed 3-6 WBCs, 0-2 RBCs, and rare bacteria. Urine culture showed no growth. CT abdomen and pelvis with contrast from 11/08/2023 showed a nonobstructing right renal calculus.  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  She presents today for further evaluation with cystoscopy. No further episodes of gross hematuria.  She continues to have left flank pain, increased with movement and position.  Portions of the above documentation were copied from a prior visit for review purposes only.   Past Medical History:  Past Medical History:  Diagnosis Date   Adrenal adenoma    Arthritis     "fingers, right shoulder" (09/09/2017)   Atrial fibrillation (HCC) 12/2021   New diagnosis noted on pacemaker interrogation.   Back pain with radiation    Chronic Back Pain - mutliple surgeries (including tumor removal)   Bilateral edema of lower extremity    Chronic, likely related to venous stasis   Bradycardia    Pacemaker placed   CAD S/P percutaneous coronary angioplasty    a) LHC: 07/30/10. -- 3.0x27mm Integrity BMS to pRCA & 2.5x 15mm Integrity BMS mLAD(@ D2).  b) Class III-IV Angina 03/2011: LHC- ISR in LAD BMS -- prox overlapping Promus DES 2.5x18mm and PTCA of jailed D2 ostium-prox 80%. c) 02/03/12:  LHC- patent stents.  Jailed diagonal. with stable flow; d) Peri-OP NSTEMI 04/2012 - LHC in 12/'13 -    Celiac artery stenosis (HCC)    12/2016 - STENT placement   Complication of anesthesia    "used to wake up wild years ago" (09/09/2017)   Congestive heart failure with LV diastolic dysfunction, NYHA class 2 (HCC)    06/13/10:  last 2D echo-  EF >55%, Mild TR, Mod Conc LVH - Grade 1 diastolic dysfunction (abnormal relaxation) --> LVEDP on Cath 28 mmHg & mild 2nd Pulm HTN   Diet-controlled type 2 diabetes mellitus (HCC)    Diverticulitis of colon (without mention of hemorrhage)(562.11)    Diverticulosis    Dyslipidemia, goal LDL below 70    Intolerant to statins   GERD (gastroesophageal reflux disease)    Hepatitis ~ 1957   "yellow jaundice" (  01/14/2017)   Hepatomegaly    Hiatal hernia    History of blood transfusion 04/2012   "when I had a heart attack"   History of kidney stones    "I've got a stone embedded in one of my kidneys" (09/09/2017)   History of radiation therapy    Left Knee - 03/05/23-04/30/23- Dr. Retta Caster   History of stomach ulcers "years ago"   Irritable bowel syndrome (IBS)    Labile essential hypertension    Partially related to RAS   Mesenteric artery stenosis (HCC)    95% Celiac Artery - ostial, 20-30% SMA.  Bilateral Renal A: L RA stent patent, R RA 20-30%  -- Conservative Management   Mobitz type 2 second degree AV block    Status post PPM placement   NSTEMI (non-ST elevated myocardial infarction) (HCC) 04/2012   Unclear the details, apparently this was postoperative from her back surgery that she was cleared for my last saw her in June.  Reportedly had stents placed   Pancreas divisum    On pancreatic enzyme   PAT (paroxysmal atrial tachycardia) (HCC)    Renal artery stenosis (HCC) 2011; 12/'13   a) Angiogram 02/03/12:  50-60%L RA stenosis, 40% R R Inferior artery; b) 12/'13: S/P L RA Stent ([facility]) 6.0 mm x 15 mm; c) Renal Duplex 10/2013: <60% L RA, <60 R RA, ~60% SMA & Celiac A.   S/P placement of cardiac pacemaker 07/2021   Medtronic   Stricture and stenosis of esophagus    Thyroid nodule    Unspecified gastritis and gastroduodenitis without mention of hemorrhage     Past Surgical History:  Past Surgical History:  Procedure Laterality Date   APPENDECTOMY     BACK SURGERY     BREAST DUCTAL SYSTEM EXCISION     right   BREAST SURGERY Right    CARDIAC CATHETERIZATION N/A 03/16/2015   Procedure: Left Heart Cath and Coronary Angiography;  Surgeon: Arleen Lacer, MD;  Location: [facility] INVASIVE CV LAB;  Service: Cardiovascular; Widely patent m-dLAD stents as wellas pRCA stent.  High LVEDP, small Diag & Om vessels with moderate stenosis    Cardiac Event Monitor  01/2017   Mostly NSR with occasional sinus tachycardia and rare bradycardia. No A. fib. No PND or PSVT. Rare PACs and PVCs.   CATARACT EXTRACTION W/ INTRAOCULAR LENS  IMPLANT, BILATERAL Bilateral    CHOLECYSTECTOMY OPEN     COLONOSCOPY  03/01/2009   normal    CORONARY ANGIOPLASTY WITH STENT PLACEMENT  07/30/2010   3.0x40mm Integrity BMS to RCA and 2.5x 15mm Integrity BMS LAD.     CORONARY ANGIOPLASTY WITH STENT PLACEMENT  03/18/2011   Cutting Balloon PTCA of D2 (jailed - 80% ostial stenosis), DES PCI of mid LAD ISR - > Promus DES 2.5 x 16 mm postdilated to 2.8 mm) covering the  proximal portion of the previous stent   DOBUTAMINE STRESS ECHO  03/07/2015   [facility] (ordered for pre-op evaluation for EUS/ERCP --> abnormal EKG:  strreesss test shhoowwed 1 mm ST segment depressions downsloping. No wall motion abnormalities at peak exercise or at rest. Diastolic dysfunction was noted but normal systolic function - EF greater than 55%. No bouts of regurgitation or stenosis. Resting hypertension with exaggerated response   ESOPHAGOGASTRODUODENOSCOPY  04/08/2012   ESOPHAGOGASTRODUODENOSCOPY (EGD) WITH ESOPHAGEAL DILATION  X 2   FRACTURE SURGERY     HAMMER TOE SURGERY Bilateral    "took bone off the top of 2nd toe on each  foot"   HIP SURGERY Left    "something to do w/my back"   JOINT REPLACEMENT     KNEE SURGERY Left    "had fluid drained off it a couple times"   LEFT HEART CATH AND CORONARY ANGIOGRAPHY  07/30/2010   severe LAD-diagonal 80%, moderate to severe proximal RCA.  Mean PAP 15 mmHg.  PCWP mean 17 mmHg.  RVP 45/11 mmHg.  PAP 47/24 mmHg, mean 31 mmHg.  LVEDP 27 mmHg   LEFT HEART CATHETERIZATION WITH CORONARY ANGIOGRAM N/A 02/03/2012   Procedure: LEFT HEART CATHETERIZATION WITH CORONARY ANGIOGRAM;  Surgeon: Arleen Lacer, MD;  Location: [facility] CATH LAB; widely patent LAD and RCA stents.  Patent D2 ostium.  Moderate L Renal A stenosis (56%), R Renal A 40-50% t.  LVEDP 20 mmHg.   LEFT HEART CATHETERIZATION WITH CORONARY ANGIOGRAM N/A 05/12/2014   Procedure: LEFT HEART CATHETERIZATION WITH CORONARY ANGIOGRAM;  Surgeon: Avanell Leigh, MD;  Location: [facility] CATH LAB;  Service: Cardiovascular: Stable CAD. Patent stents. Patent renal artery stent   LEFT HEART CATHETERIZATION WITH CORONARY ANGIOGRAM  08/2012   Peri-Op MI @ [facility] -- 40% ostial D1, patent LAD stents, 10% RCA ISR   LEFT HEART CATHETERIZATION WITH CORONARY ANGIOGRAM   03/18/2011   70% ISR of LAD stent just after D2 (D2 has ostial 80 to 90% stenosis.  20 to 30% ISR RCA.  EDP elevated at 28 mmHg   NM MYOVIEW  LTD  12/2016   LOW RISK study. No ischemia or infarction. EF 65-75%.   NM MYOVIEW LTD  01/05/2020    LOW RISK. EF 60-65%.  No EKG changes.  No infarct, no ischemia.   PACEMAKER IMPLANT N/A 07/09/2021   Procedure: PACEMAKER IMPLANT;  Surgeon: Luana Rumple, MD;  Location: [facility] INVASIVE CV LAB;  Service: Cardiovascular;  Laterality: N/A;   PANCREAS SURGERY     "stent in my pancreas"; Dr Dayla Eva   PERCUTANEOUS PINNING PHALANX FRACTURE OF HAND Left ~ 2013   PERIPHERAL VASCULAR BALLOON ANGIOPLASTY  09/09/2017   Procedure: PERIPHERAL VASCULAR BALLOON ANGIOPLASTY;  Surgeon: Margherita Shell, MD;  Location: [facility] INVASIVE CV LAB;  Service: Cardiovascular;;  Celiac instent   PERIPHERAL VASCULAR INTERVENTION  01/14/2017   Procedure: Peripheral Vascular Intervention;  Surgeon: Margherita Shell, MD;  Location: [facility] INVASIVE CV LAB;  Service: Cardiovascular;;  mesentric   RENAL ARTERY STENT Left 08/2012   @ [facility] - 6.0 mm x 15 mm   SPINE SURGERY     tumor removed 07/2010; Redo Surgery 04/2012; Sacroiliac Sgx 08/2013   TOTAL ABDOMINAL HYSTERECTOMY     TOTAL KNEE ARTHROPLASTY Left ~ 2012   TRANSTHORACIC ECHOCARDIOGRAM  07/10/2021   (Postop PPM): Normal LV size and function.  EF 60 to 65%.  No RWMA.  GR 1 DD.  Mild LA dilation.  Normal RV size and function.  Normal RAP.  Normal aortic and mitral valves.  No pericardial effusion.   VISCERAL ANGIOGRAM N/A 05/12/2014   Procedure: VISCERAL ANGIOGRAM;  Surgeon: Avanell Leigh, MD;  Location[facility] CATH LAB;  25% ostial proximal celiac artery with downward takeoff.  23% proximal SMA and 56% proximal IMA.  Left renal artery stent widely patent.  Right renal artery is 20 to 30% proximal stenosis.   VISCERAL ANGIOGRAPHY N/A 01/14/2017   Procedure: Mesenteric  Angiography;  Surgeon: Margherita Shell, MD;  Location: [facility] INVASIVE CV LAB;  Service: Cardiovascular;  Laterality: N/A;   VISCERAL ANGIOGRAPHY N/A 09/09/2017  Procedure: VISCERAL ANGIOGRAPHY;   Surgeon: Margherita Shell, MD;  Location: [facility] INVASIVE CV LAB;  Service: Cardiovascular;  Laterality: N/A;    Allergies:  Allergies  Allergen Reactions   Codeine Phosphate Anaphylaxis, Shortness Of Breath and Swelling   Contrast Media [Iodinated Contrast Media] Swelling    Swelling of the face. Reaction was to ionic contrast many years ago. Patient has had non-ionic contrast many time without pre medication and has had no reaction.    Statins Other (See Comments)    Hyperactivity   Tramadol Palpitations and Other (See Comments)    Hyperactivity    Family History:  Family History  Problem Relation Age of Onset   Stroke Mother    Cancer Father        mets   Heart attack Father    Heart disease Father    Heart attack Brother    Diabetes Brother    Heart attack Brother    Colitis Maternal Grandfather    Cancer Daughter    Colon cancer Neg Hx    Stomach cancer Neg Hx     Social History:  Social History   Tobacco Use   Smoking status: Never   Smokeless tobacco: Never  Vaping Use   Vaping status: Never Used  Substance Use Topics   Alcohol use: No   Drug use: No    ROS: Constitutional:  Negative for fever, chills, weight loss CV: Negative for chest pain, previous MI, hypertension Respiratory:  Negative for shortness of breath, wheezing, sleep apnea, frequent cough GI:  Negative for nausea, vomiting, bloody stool, GERD  Physical exam: BP (!) 159/82   Pulse 86   Ht 5\' 8"  (1.727 m)   Wt 196 lb (88.9 kg)   BMI 29.80 kg/m  GENERAL APPEARANCE:  Well appearing, well developed, well nourished, NAD HEENT:  Atraumatic, normocephalic, oropharynx clear NECK:  Supple without lymphadenopathy or thyromegaly ABDOMEN:  Soft, non-tender, no masses EXTREMITIES:  Moves all extremities well, without clubbing, cyanosis, or edema NEUROLOGIC:  Alert and oriented x 3, normal gait, CN II-XII grossly intact MENTAL STATUS:  appropriate BACK:  Non-tender to palpation, No CVAT SKIN:  Warm,  dry, and intact  Results: U/A: negative  CYSTOSCOPY  Procedure: Flexible cystoscopy  Pre-Operative Diagnosis:  Gross hematuria  Post-Operative Diagnosis: Gross hematuria  Anesthesia: local with lidocaine gel  Surgical Narrative:  After appropriate informed consent was obtained, the patient was prepped and draped in the usual sterile fashion in the supine position. She was correctly identified and the proper procedure delineated prior to proceeding. Sterile lidocaine gel was instilled in the urethra.  The flexible cystoscope was introduced without difficulty.  Findings:  Urethra: Normal  Bladder:  diverticulum at bladder dome  Ureteral orifices: normal  Additional findings: none  A bladder wash was obtained for cytology.  Pelvic exam was positive for pelvic prolapse.  She tolerated the procedure well.  A chaperone was present throughout the procedure.

## 2023-12-15 NOTE — Progress Notes (Signed)
 Radiation Oncology         [phone] ________________________________  Name: [name] MRN: [id number]  Date: 12/15/2023  DOB: May 15, 1941  Follow-Up Visit Note  CC: Shelva Majestic, MD  Josph Macho, MD    ICD-10-CM   1. Primary myxofibrosarcoma (HCC)  C49.9       Diagnosis: The primary encounter diagnosis was Sarcoma of lower extremity, left (HCC). A diagnosis of Primary myxofibrosarcoma (HCC) was also pertinent to this visit.   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 She was last seen here for follow-up on 09/15/23.   Since her last visit, the patient presented for an MRI of the left knee on 09/23/23 which demonstrated similar appearing to a slight decrease in size of the small postsurgical fluid collections within the resection bed, with postoperative changed related to prior resection of the subcutaneous mass at the posteromedial knee. Overall, no evidence of residual or recurrent metastatic disease was appreciated within the left knee.  A CT CAP with contrast was also performed on 10/30/23 for restaging which demonstrated no evidence of lymphadenopathy or metastatic disease in the chest, abdomen, or pelvis. Other notable findings included no change in appearance of the distal sigmoid colon with an approximately 6 cm thickened, diverticular segment. As noted on prior reports, this most likely reflects a chronic sequelae of prior diverticulitis, however, underlying mass is not strictly excluded.       During her most recent follow-up visit with Dr. Myna Hidalgo on  11/11/23, the patient endorsed ongoing left knee pain, specifically focused to the posterior aspect of the knee. Dr. Myna Hidalgo has advised her to meet with orthopedic surgery again for further evaluation of her pain, however the patient expressed to Dr. Myna Hidalgo that she does not want to do so.    To further evaluated her ongoing left knee pain, Dr. Myna Hidalgo has scheduled her for another MRI of the left knee on 12/22/23.            Of note: She is undergoing work-up per urology for gross hematuria which consisted of a cystoscopy which was performed earlier today. Her recent CT CAP noted above also showed a nonobstructing right renal calculus.                  Patient notes consistent left popliteal pain since her surgery.  She is taking ibuprofen and Tylenol regularly to control the pain.  She occasionally takes half a hydrocodone.  She denies any issues with ambulation.  She also notes a new "bump" near the surgical incision.  Allergies:  is allergic to codeine phosphate, contrast media [iodinated contrast media], statins, and tramadol.  Meds: Current Outpatient Medications  Medication Sig Dispense Refill   alendronate (FOSAMAX) 70 MG tablet TAKE 1 TABLET (70 MG TOTAL) BY MOUTH EVERY 7 DAYS WITH FULL GLASS WATER ON EMPTY STOMACH 12 tablet 3   allopurinol (ZYLOPRIM) 100 MG tablet TAKE 1 TABLET BY MOUTH EVERY DAY 90 tablet 2   amiodarone (PACERONE) 200 MG tablet TAKE 400 MG (2 TABLETS) ONE DOSE AS NEED FOR A FAST HEART RATE ,IF  AFTER 12 HOURS STILL PRESENT TAKE 200 MG ( 1 TABLET) ONE DOSE FOR EACH EPISODE 180 tablet 1   apixaban (ELIQUIS) 5 MG TABS tablet Take 1 tablet (5 mg total) by mouth 2 (two) times daily. 180 tablet 3   Artificial Tear Solution (TEARS RENEWED OP) Apply 1 drop to eye daily as needed (dry eyes).     carvedilol (COREG) 12.5 MG tablet Take 1 tablet (12.5 mg total) by mouth 2 (two) times daily. May take an additional 12.5 mg if needed for extra palp. 180 tablet 3   colchicine 0.6 MG  tablet Take by mouth.     cyanocobalamin (VITAMIN B12) 1000 MCG/ML injection 1000 MCG INJECTION ONCE PER MONTH. PLEASE PROVIDE SYRINGES AS WELL 3 mL 5   diclofenac sodium (VOLTAREN) 1 % GEL Apply 2 g topically 4 (four) times daily as needed (pain).     furosemide (LASIX) 80 MG tablet TAKE 1 TABLET(80 MG) BY MOUTH ONCE A DAY , MAY TAKE AN ADDITIONAL 1 TABLET IF NEEDED FOR SWELLING 180 tablet 3   HYDROcodone-acetaminophen (NORCO/VICODIN) 5-325 MG tablet Take 1 tablet by mouth every 6 (six) hours as needed for moderate pain (pain score 4-6). 30 tablet 0   hyoscyamine (ANASPAZ) 0.125 MG TBDP disintergrating tablet PLACE 1 TABLET (0.125 MG TOTAL) UNDER THE TONGUE EVERY 6 (SIX) HOURS AS NEEDED (ABDOMINAL CRAMPING). 7 tablet 5   isosorbide mononitrate (IMDUR) 60 MG 24 hr tablet Take 1 tablet (60 mg total) by mouth daily. May take an extra dose if you use Nitroglycerin  sublingual tablet for 2 days afterwards 100 tablet 3   lidocaine (XYLOCAINE) 5 % ointment Apply 1 application. topically as needed. 35.44 g 0   losartan (COZAAR) 50 MG tablet TAKE 1 TABLET BY MOUTH EVERY DAY 90 tablet 3   meclizine (ANTIVERT) 12.5 MG tablet TAKE 1 TABLET (12.5 MG TOTAL) BY MOUTH 2 (TWO) TIMES DAILY AS NEEDED FOR DIZZINESS. 60 tablet 1   nitroGLYCERIN (NITROSTAT) 0.4 MG SL tablet DISSOLVE 1 TABLET UNDER THE TONGUE EVERY 5 MINUTES AS NEEDED FOR CHEST PAIN 25 tablet 6   nystatin (MYCOSTATIN/NYSTOP) powder Apply 1 Application topically 3 (three) times daily. 30 g 2   potassium chloride (KLOR-CON M) 10 MEQ tablet TAKE 1 TABLET (10 MEQ TOTAL) BY MOUTH 2 (TWO) TIMES DAILY AS NEEDED (AS INSTRUCTED PER DR. HUNTER). 90 tablet 1   predniSONE (DELTASONE) 50 MG tablet Take 1 tablet (50mg ) 13 hours, 7 hours and 1 hour before MRI. 3 tablet 0   spironolactone (ALDACTONE) 25 MG tablet TAKE 1 TABLET BY MOUTH AS DIRECTED. TAKE 1 TABLET ON MONDAYS, WEDNESDAYS, AND FRIDAYS AT 6 PM. 45 tablet 3   No current facility-administered medications for  this encounter.    Physical Findings: The patient is in no acute distress. Patient is alert and oriented.  height is 5\' 8"  (1.727 m) and weight is 203 lb 12.8 oz (92.4 kg). Her temperature is 97.3 F (36.3 C) (abnormal). Her blood pressure is 156/61 (abnormal) and her pulse is 87. Her respiration is 20 and oxygen saturation is 96%. .  No significant changes. Lungs are clear to auscultation bilaterally. Heart has regular rate and rhythm. No palpable cervical, supraclavicular, or axillary adenopathy. Abdomen soft, non-tender, normal bowel sounds.  Well-healed surgical incision in the left popliteal fossa.  Hardened tissue palpated beneath the incision.  No signs of infection.  Bilateral lower leg edema.   Lab Findings: Lab Results  Component Value Date   WBC 7.3 11/11/2023  HGB 11.8 (L) 11/11/2023   HCT 36.6 11/11/2023   MCV 93.6 11/11/2023   PLT 256 11/11/2023    Radiographic Findings: No results found.  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  She notes a new palpable bump near her surgical incision which Dr. Maria Shiner has recommended follow-up MRI for.  MRI is scheduled for 12/22/2023 and she will meet with Dr. Maria Shiner on 12/31/2023 to review the results.  We will continue to follow her and have scheduled her to see us  in 3 months.  She was encouraged to call with any questions or concerns in the meantime.   20 minutes of total time was spent for this patient encounter, including preparation, face-to-face counseling with the patient and coordination of care, physical exam, and documentation of the encounter. ____________________________________   Julio Ohm, PA-C   Noralee Beam, PhD, MD   [facility]  Radiation Oncology Direct Dial: [phone]  Fax: [phone] [email or web address]    This  document serves as a record of services personally performed by Retta Caster, MD and Julio Ohm, PA-C. It was created on his behalf by Aleta Anda, a trained medical scribe. The creation of this record is based on the scribe's personal observations and the provider's statements to them. This document has been checked and approved by the attending provider.

## 2023-12-15 NOTE — Progress Notes (Signed)
 [name] is here today for follow up post radiation to left knee.   They completed their radiation on: 04/30/2023  Does the patient complain of any of the following: Post radiation skin issues: Denies Joint Pain/ Swelling: Reports swelling to bilateral feet and knees, but reports it's more prominent to left. Also reports ongoing pain to back of left knee, usually about a 5/6 out of 10. Manages with OTC acetaminophen and ibuprofen. States pain does not interfere with ambulation Range of Motion limitations: Denies.  Fatigue post radiation: Denies  Additional comments if applicable: Scheduled for an MRI of knee next week per Dr. Maria Shiner

## 2023-12-16 ENCOUNTER — Inpatient Hospital Stay

## 2023-12-16 ENCOUNTER — Ambulatory Visit: Admitting: Hematology & Oncology

## 2023-12-18 ENCOUNTER — Other Ambulatory Visit: Admitting: Urology

## 2023-12-19 ENCOUNTER — Encounter: Payer: Self-pay | Admitting: Urology

## 2023-12-22 ENCOUNTER — Ambulatory Visit (HOSPITAL_COMMUNITY)
Admission: RE | Admit: 2023-12-22 | Discharge: 2023-12-22 | Disposition: A | Discharge status: Home / Self Care | Source: Ambulatory Visit | Attending: Hematology & Oncology | Admitting: Hematology & Oncology

## 2023-12-22 DIAGNOSIS — C499 Malignant neoplasm of connective and soft tissue, unspecified: Secondary | ICD-10-CM | POA: Insufficient documentation

## 2023-12-22 DIAGNOSIS — R2242 Localized swelling, mass and lump, left lower limb: Secondary | ICD-10-CM | POA: Diagnosis not present

## 2023-12-22 MED ORDER — GADOBUTROL 1 MMOL/ML IV SOLN
9.0000 mL | Freq: Once | INTRAVENOUS | Status: AC | PRN
  Administered 2023-12-22: 9 mL via INTRAVENOUS

## 2023-12-24 NOTE — CV Procedure (Signed)
  Device system confirmed to be MRI conditional, with implant date > 6 weeks ago, and no evidence of abandoned or epicardial leads in review of most recent CXR  Device last cleared by EP Provider: Creighton Doffing  Clearance is good through for 1 year as long as parameters remain stable at time of check. If pt undergoes a cardiac device procedure during that time, they should be re-cleared.   Tachy-therapies to be programmed off if applicable with device back to pre-MRI settings after completion of exam.  [employer] - Programming recommendation received through [employer] App/Tablet  Arlys Lamer, RT  12/24/2023 11:11 AM

## 2023-12-31 ENCOUNTER — Inpatient Hospital Stay (HOSPITAL_BASED_OUTPATIENT_CLINIC_OR_DEPARTMENT_OTHER): Admitting: Hematology & Oncology

## 2023-12-31 ENCOUNTER — Encounter: Payer: Self-pay | Admitting: Hematology & Oncology

## 2023-12-31 ENCOUNTER — Inpatient Hospital Stay: Attending: Hematology & Oncology

## 2023-12-31 ENCOUNTER — Other Ambulatory Visit (HOSPITAL_BASED_OUTPATIENT_CLINIC_OR_DEPARTMENT_OTHER): Payer: Self-pay

## 2023-12-31 VITALS — BP 118/56 | HR 83 | Temp 98.5°F | Resp 18 | Ht 68.0 in | Wt 198.8 lb

## 2023-12-31 DIAGNOSIS — C499 Malignant neoplasm of connective and soft tissue, unspecified: Secondary | ICD-10-CM | POA: Diagnosis not present

## 2023-12-31 DIAGNOSIS — Z08 Encounter for follow-up examination after completed treatment for malignant neoplasm: Secondary | ICD-10-CM | POA: Insufficient documentation

## 2023-12-31 DIAGNOSIS — Z85831 Personal history of malignant neoplasm of soft tissue: Secondary | ICD-10-CM | POA: Diagnosis not present

## 2023-12-31 LAB — CMP (CANCER CENTER ONLY)
ALT: 8 U/L (ref 0–44)
AST: 9 U/L — ABNORMAL LOW (ref 15–41)
Albumin: 4.2 g/dL (ref 3.5–5.0)
Alkaline Phosphatase: 71 U/L (ref 38–126)
Anion gap: 9 (ref 5–15)
BUN: 52 mg/dL — ABNORMAL HIGH (ref 8–23)
CO2: 31 mmol/L (ref 22–32)
Calcium: 9.6 mg/dL (ref 8.9–10.3)
Chloride: 95 mmol/L — ABNORMAL LOW (ref 98–111)
Creatinine: 1.38 mg/dL — ABNORMAL HIGH (ref 0.44–1.00)
GFR, Estimated: 38 mL/min — ABNORMAL LOW (ref >60)
Glucose, Bld: 127 mg/dL — ABNORMAL HIGH (ref 70–99)
Potassium: 4.4 mmol/L (ref 3.5–5.1)
Sodium: 135 mmol/L (ref 135–145)
Total Bilirubin: 0.5 mg/dL (ref 0.0–1.2)
Total Protein: 7 g/dL (ref 6.5–8.1)

## 2023-12-31 LAB — LACTATE DEHYDROGENASE: LDH: 156 U/L (ref 98–192)

## 2023-12-31 LAB — CBC WITH DIFFERENTIAL (CANCER CENTER ONLY)
Abs Immature Granulocytes: 0.03 10*3/uL (ref 0.00–0.07)
Basophils Absolute: 0.1 10*3/uL (ref 0.0–0.1)
Basophils Relative: 1 %
Eosinophils Absolute: 0.3 10*3/uL (ref 0.0–0.5)
Eosinophils Relative: 4 %
HCT: 34.5 % — ABNORMAL LOW (ref 36.0–46.0)
Hemoglobin: 11.5 g/dL — ABNORMAL LOW (ref 12.0–15.0)
Immature Granulocytes: 0 %
Lymphocytes Relative: 20 %
Lymphs Abs: 1.4 10*3/uL (ref 0.7–4.0)
MCH: 30.3 pg (ref 26.0–34.0)
MCHC: 33.3 g/dL (ref 30.0–36.0)
MCV: 91 fL (ref 80.0–100.0)
Monocytes Absolute: 0.9 10*3/uL (ref 0.1–1.0)
Monocytes Relative: 13 %
Neutro Abs: 4.5 10*3/uL (ref 1.7–7.7)
Neutrophils Relative %: 62 %
Platelet Count: 247 10*3/uL (ref 150–400)
RBC: 3.79 MIL/uL — ABNORMAL LOW (ref 3.87–5.11)
RDW: 13.2 % (ref 11.5–15.5)
WBC Count: 7.2 10*3/uL (ref 4.0–10.5)
nRBC: 0 % (ref 0.0–0.2)

## 2023-12-31 MED ORDER — HYDROCODONE-ACETAMINOPHEN 5-325 MG PO TABS
1.0000 | ORAL_TABLET | Freq: Four times a day (QID) | ORAL | 0 refills | Status: DC | PRN
  Filled 2023-12-31: qty 30, 8d supply, fill #0

## 2023-12-31 NOTE — Progress Notes (Signed)
 [facility] Follow Up Visit  [name] [id number] 10-11-1940 83 y.o. 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 is back for follow-up.  So far, everything seems to be going pretty well.  She had MRI of the left knee.  Unfortunately, this has not yet been read.  She does have some problems with arthritis.  This has been a chronic problem for her.  She has had no bleeding.  She has had no cough or shortness of breath.  She has had no change in bowel or bladder habits.  She and her husband have a very nice Easter.  Overall, I would have to say that her performance status is probably ECOG 1.       Medications:  Current Outpatient Medications:    alendronate  (FOSAMAX ) 70 MG tablet, TAKE 1 TABLET (70 MG TOTAL) BY MOUTH EVERY 7 DAYS WITH FULL GLASS WATER ON EMPTY STOMACH, Disp: 12 tablet, Rfl: 3   allopurinol  (ZYLOPRIM ) 100 MG tablet, TAKE 1 TABLET BY MOUTH EVERY DAY, Disp: 90 tablet, Rfl: 2   amiodarone  (PACERONE ) 200 MG tablet, TAKE 400 MG (2 TABLETS) ONE DOSE AS NEED FOR A FAST HEART RATE ,IF AFTER 12 HOURS STILL PRESENT TAKE 200 MG ( 1 TABLET) ONE DOSE FOR EACH EPISODE, Disp: 180 tablet, Rfl: 1   apixaban  (ELIQUIS ) 5 MG TABS tablet, Take 1 tablet (5 mg total) by mouth 2 (two) times daily., Disp: 180 tablet, Rfl: 3   Artificial Tear Solution (TEARS RENEWED OP), Apply 1 drop to eye daily as needed (dry eyes)., Disp: , Rfl:    carvedilol  (COREG ) 12.5 MG tablet, Take 1 tablet (12.5 mg total) by mouth 2 (two) times daily. May take an additional 12.5 mg if needed for extra palp., Disp: 180 tablet, Rfl: 3   cyanocobalamin  (VITAMIN B12) 1000 MCG/ML injection, 1000 MCG INJECTION ONCE PER MONTH. PLEASE PROVIDE SYRINGES AS WELL, Disp: 3 mL, Rfl: 5   diclofenac sodium (VOLTAREN) 1 % GEL, Apply  2 g topically 4 (four) times daily as needed (pain)., Disp: , Rfl:    furosemide  (LASIX ) 80 MG tablet, TAKE 1 TABLET(80 MG) BY MOUTH ONCE A DAY , MAY TAKE AN ADDITIONAL 1 TABLET IF NEEDED FOR SWELLING, Disp: 180 tablet, Rfl: 3   HYDROcodone -acetaminophen  (NORCO/VICODIN) 5-325 MG tablet, Take 1 tablet by mouth every 6 (six) hours as needed for moderate pain (pain score 4-6)., Disp: 30 tablet, Rfl: 0   hyoscyamine  (ANASPAZ ) 0.125 MG TBDP disintergrating tablet, PLACE 1 TABLET (0.125 MG TOTAL) UNDER THE TONGUE EVERY 6 (SIX) HOURS AS NEEDED (ABDOMINAL CRAMPING)., Disp: 7 tablet, Rfl: 5   isosorbide  mononitrate (IMDUR ) 60 MG 24 hr tablet, Take 1 tablet (60 mg total) by mouth daily. May take an extra dose if you use Nitroglycerin   sublingual tablet for 2 days afterwards, Disp: 100 tablet, Rfl: 3   lidocaine  (XYLOCAINE ) 5 % ointment, Apply 1 application. topically as needed., Disp: 35.44 g, Rfl: 0   losartan  (COZAAR ) 50 MG tablet, TAKE 1 TABLET BY MOUTH EVERY DAY, Disp: 90 tablet, Rfl: 3   meclizine  (ANTIVERT ) 12.5 MG tablet, TAKE 1 TABLET (12.5 MG TOTAL) BY MOUTH 2 (TWO) TIMES DAILY AS NEEDED FOR DIZZINESS., Disp: 60 tablet, Rfl: 1   nystatin  (MYCOSTATIN /NYSTOP ) powder, Apply 1 Application topically 3 (three) times daily., Disp: 30 g, Rfl:  2   spironolactone  (ALDACTONE ) 25 MG tablet, TAKE 1 TABLET BY MOUTH AS DIRECTED. TAKE 1 TABLET ON MONDAYS, WEDNESDAYS, AND FRIDAYS AT 6 PM., Disp: 45 tablet, Rfl: 3   colchicine 0.6 MG tablet, Take by mouth. (Patient not taking: Reported on 12/31/2023), Disp: , Rfl:    nitroGLYCERIN  (NITROSTAT ) 0.4 MG SL tablet, DISSOLVE 1 TABLET UNDER THE TONGUE EVERY 5 MINUTES AS NEEDED FOR CHEST PAIN (Patient not taking: Reported on 12/31/2023), Disp: 25 tablet, Rfl: 6   potassium chloride  (KLOR-CON  M) 10 MEQ tablet, TAKE 1 TABLET (10 MEQ TOTAL) BY MOUTH 2 (TWO) TIMES DAILY AS NEEDED (AS INSTRUCTED PER DR. HUNTER)., Disp: 90 tablet, Rfl: 1   predniSONE  (DELTASONE ) 50 MG tablet, Take 1  tablet (50mg ) 13 hours, 7 hours and 1 hour before MRI. (Patient not taking: Reported on 12/31/2023), Disp: 3 tablet, Rfl: 0  Allergies:  Allergies  Allergen Reactions   Codeine Phosphate Anaphylaxis, Shortness Of Breath and Swelling   Contrast Media [Iodinated Contrast Media] Swelling    Swelling of the face. Reaction was to ionic contrast many years ago. Patient has had non-ionic contrast many time without pre medication and has had no reaction.    Statins Other (See Comments)    Hyperactivity   Tramadol Palpitations and Other (See Comments)    Hyperactivity    Past Medical History, Surgical history, Social history, and Family History were reviewed and updated.  Review of Systems: Review of Systems  Constitutional: Negative.   HENT:  Negative.    Eyes: Negative.   Respiratory: Negative.    Cardiovascular: Negative.   Gastrointestinal: Negative.   Endocrine: Negative.   Genitourinary: Negative.    Musculoskeletal:  Positive for arthralgias.  Neurological: Negative.   Hematological: Negative.   Psychiatric/Behavioral: Negative.      Physical Exam:  height is 5\' 8"  (1.727 m) and weight is 198 lb 12 oz (90.2 kg). Her oral temperature is 98.5 F (36.9 C). Her blood pressure is 118/56 (abnormal) and her pulse is 83. Her respiration is 18 and oxygen saturation is 95%.   Wt Readings from Last 3 Encounters:  12/31/23 198 lb 12 oz (90.2 kg)  12/15/23 203 lb 12.8 oz (92.4 kg)  12/15/23 196 lb (88.9 kg)    Physical Exam Vitals reviewed.  HENT:     Head: Normocephalic and atraumatic.  Eyes:     Pupils: Pupils are equal, round, and reactive to light.  Cardiovascular:     Rate and Rhythm: Normal rate and regular rhythm.     Heart sounds: Normal heart sounds.  Pulmonary:     Effort: Pulmonary effort is normal.     Breath sounds: Normal breath sounds.  Abdominal:     General: Bowel sounds are normal.     Palpations: Abdomen is soft.  Musculoskeletal:        General: No  tenderness or deformity. Normal range of motion.     Cervical back: Normal range of motion.     Comments: Behind the left knee is a surgical scar.  This is well-healed.  She has significant radiation dermatitis.  There is couple areas of open sores.  There is no obvious discharge.  She has a little bit of swelling.    Lymphadenopathy:     Cervical: No cervical adenopathy.  Skin:    General: Skin is warm and dry.     Findings: No erythema or rash.  Neurological:     Mental Status: She is alert and oriented to person, place,  and time.  Psychiatric:        Behavior: Behavior normal.        Thought Content: Thought content normal.        Judgment: Judgment normal.      Lab Results  Component Value Date   WBC 7.2 12/31/2023   HGB 11.5 (L) 12/31/2023   HCT 34.5 (L) 12/31/2023   MCV 91.0 12/31/2023   PLT 247 12/31/2023     Chemistry      Component Value Date/Time   NA 137 11/11/2023 1148   NA 136 04/29/2022 1230   K 4.6 11/11/2023 1148   CL 100 11/11/2023 1148   CO2 29 11/11/2023 1148   BUN 24 (H) 11/11/2023 1148   BUN 30 (H) 04/29/2022 1230   CREATININE 0.93 11/11/2023 1148   CREATININE 0.90 10/24/2016 1127      Component Value Date/Time   CALCIUM 9.1 11/11/2023 1148   ALKPHOS 74 11/11/2023 1148   AST 11 (L) 11/11/2023 1148   ALT 9 11/11/2023 1148   BILITOT 0.6 11/11/2023 1148       Impression and Plan: [name] is a very charming 83 year old white female.  She has a sarcoma behind the left knee in the popliteal fossa.  This is a fairly high-grade sarcoma.  As such, she had this resected.  Her margins were positive.  We have given her radiotherapy.  She completed this about 9 months ago.  It will be interesting to see what the MRI has to show.  Hopefully, this will give us  an idea as to the status of her radiation that she took.  We will try to move her appointments out little bit longer now.  Will plan to see her back probably in about 2 months or so.    Ivor Mars, MD 4/30/20251:01 PM

## 2024-01-14 ENCOUNTER — Telehealth: Payer: Self-pay | Admitting: Cardiovascular Disease

## 2024-01-14 NOTE — Telephone Encounter (Signed)
 Pt spouse called in stating they received a call this morning that device was not connected or that they didn't get a transmission. Please advise.

## 2024-01-16 NOTE — Telephone Encounter (Signed)
 I spoke with the pt and ordered her a Relay monitor. She should receive it in 7-10 business days.

## 2024-02-18 ENCOUNTER — Other Ambulatory Visit: Payer: Self-pay | Admitting: Cardiology

## 2024-02-19 ENCOUNTER — Ambulatory Visit (INDEPENDENT_AMBULATORY_CARE_PROVIDER_SITE_OTHER): Admitting: Urology

## 2024-02-19 ENCOUNTER — Encounter: Payer: Self-pay | Admitting: Urology

## 2024-02-19 ENCOUNTER — Ambulatory Visit: Admitting: Family Medicine

## 2024-02-19 ENCOUNTER — Encounter: Payer: Self-pay | Admitting: Family Medicine

## 2024-02-19 VITALS — BP 147/80 | HR 93 | Ht 68.0 in | Wt 196.0 lb

## 2024-02-19 VITALS — BP 128/50 | HR 76 | Temp 99.0°F | Resp 18 | Ht 68.0 in | Wt 206.1 lb

## 2024-02-19 DIAGNOSIS — N3941 Urge incontinence: Secondary | ICD-10-CM | POA: Diagnosis not present

## 2024-02-19 DIAGNOSIS — I480 Paroxysmal atrial fibrillation: Secondary | ICD-10-CM | POA: Diagnosis not present

## 2024-02-19 DIAGNOSIS — I5030 Unspecified diastolic (congestive) heart failure: Secondary | ICD-10-CM | POA: Diagnosis not present

## 2024-02-19 DIAGNOSIS — I2510 Atherosclerotic heart disease of native coronary artery without angina pectoris: Secondary | ICD-10-CM | POA: Diagnosis not present

## 2024-02-19 DIAGNOSIS — R310 Gross hematuria: Secondary | ICD-10-CM | POA: Diagnosis not present

## 2024-02-19 DIAGNOSIS — Z9861 Coronary angioplasty status: Secondary | ICD-10-CM | POA: Diagnosis not present

## 2024-02-19 DIAGNOSIS — H8110 Benign paroxysmal vertigo, unspecified ear: Secondary | ICD-10-CM

## 2024-02-19 DIAGNOSIS — N200 Calculus of kidney: Secondary | ICD-10-CM

## 2024-02-19 DIAGNOSIS — E538 Deficiency of other specified B group vitamins: Secondary | ICD-10-CM

## 2024-02-19 DIAGNOSIS — E1142 Type 2 diabetes mellitus with diabetic polyneuropathy: Secondary | ICD-10-CM | POA: Diagnosis not present

## 2024-02-19 DIAGNOSIS — N3281 Overactive bladder: Secondary | ICD-10-CM

## 2024-02-19 LAB — URINALYSIS, ROUTINE W REFLEX MICROSCOPIC
Bilirubin, UA: NEGATIVE
Glucose, UA: NEGATIVE
Ketones, UA: NEGATIVE
Leukocytes,UA: NEGATIVE
Nitrite, UA: NEGATIVE
Protein,UA: NEGATIVE
Specific Gravity, UA: <1.005 — ABNORMAL LOW (ref 1.005–1.030)
Urobilinogen, Ur: 0.2 mg/dL (ref 0.2–1.0)
pH, UA: 6 (ref 5.0–7.5)

## 2024-02-19 LAB — MICROSCOPIC EXAMINATION

## 2024-02-19 MED ORDER — HYOSCYAMINE SULFATE 0.125 MG PO TBDP: 0.1250 mg | ORAL_TABLET | Freq: Four times a day (QID) | ORAL | 5 refills | Status: AC | PRN

## 2024-02-19 MED ORDER — SOLIFENACIN SUCCINATE 10 MG PO TABS: 10.0000 mg | ORAL_TABLET | Freq: Every day | ORAL | 11 refills | Status: DC

## 2024-02-19 MED ORDER — MECLIZINE HCL 12.5 MG PO TABS: 12.5000 mg | ORAL_TABLET | Freq: Two times a day (BID) | ORAL | 2 refills | Status: DC | PRN

## 2024-02-19 NOTE — Patient Instructions (Addendum)
 Schedule diabetes ey eexam  Please stop by lab before you go If you have mychart- we will send your results within 3 business days of us  receiving them.  If you do not have mychart- we will call you about results within 5 business days of us  receiving them.  *please also note that you will see labs on mychart as soon as they post. I will later go in and write notes on them- will say notes from Dr. Arlene Ben   Recommended follow up: Return in about 14 weeks (around 05/27/2024) for followup or sooner if needed.Schedule b4 you leave.

## 2024-02-19 NOTE — Progress Notes (Signed)
 Assessment: 1. Gross hematuria - negative evaluation 4/25   2. Urge incontinence   3. Nephrolithiasis     Plan: No further episodes of gross hematuria.  Prior negative evaluation. Diagnosis and management of OAB/urge incontinence discussed with the patient.  Options for management including avoidance of dietary irritants, behavioral modification, medical therapy, neuromodulation, and chemodenervation discussed. Trial of solifenacin 10 mg daily.  Patient instructed to start with a 5 mg dose and increase if needed for symptom management. Use and side effects discussed. Return to office in 2 months.  Chief Complaint:  Chief Complaint  Patient presents with   Nephrolithiasis   Hematuria    History of Present Illness:  [name] is a 83 y.o. female who is seen for further evaluation of gross hematuria and left flank pain. She has a history of nephrolithiasis and was previously followed at [facility] in [facility].  Her last visit was in November 2021.  At that time, she was found to have a right renal calculus. She recently had acute onset of left-sided flank pain and gross hematuria.  She thinks she may have passed a stone.  The hematuria resolved after 1 void.  She continued to have some left-sided flank and back pain, worse with movement and certain positions.  No dysuria.  She reported baseline symptoms of frequency, urgency, nocturia, and incontinence.  Urinalysis from 11/13/2023 showed 3-6 WBCs, 0-2 RBCs, and rare bacteria. Urine culture showed no growth. CT abdomen and pelvis with contrast from 11/08/2023 showed a nonobstructing right renal calculus. Cystoscopy from 4/25 showed no urethral or bladder abnormalities.  Urine cytology was negative for malignancy or dysplasia.  She returns today for follow-up.  No further episodes of gross hematuria.  No dysuria.  She does have continued left-sided flank pain increased with movement.  She continues to have symptoms of  frequency, urgency, nocturia, and urge incontinence.  She is requiring pads daily for her incontinence.  She has not tried any medical therapy to date.  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  Portions of the above documentation were copied from a prior visit for review purposes only.   Past Medical History:  Past Medical History:  Diagnosis Date   Adrenal adenoma    Arthritis    fingers, right shoulder (09/09/2017)   Atrial fibrillation (HCC) 12/2021   New diagnosis noted on pacemaker interrogation.   Back pain with radiation    Chronic Back Pain - mutliple surgeries (including tumor removal)   Bilateral edema of lower extremity    Chronic, likely related to venous stasis   Bradycardia    Pacemaker placed   CAD S/P percutaneous coronary angioplasty    a) LHC: 07/30/10. -- 3.0x40mm Integrity BMS to pRCA & 2.5x 15mm Integrity BMS mLAD(@ D2).  b) Class III-IV Angina 03/2011: LHC- ISR in LAD BMS -- prox overlapping Promus DES 2.5x56mm and PTCA of jailed D2 ostium-prox 80%. c) 02/03/12:  LHC- patent stents.  Jailed diagonal. with stable flow; d) Peri-OP NSTEMI 04/2012 - LHC in 12/'13 -    Celiac artery stenosis (HCC)    12/2016 - STENT placement   Complication of anesthesia    used to wake up wild years ago (09/09/2017)   Congestive heart failure with LV diastolic dysfunction, NYHA class 2 (HCC)    06/13/10:  last 2D echo-  EF >55%, Mild TR, Mod Conc LVH - Grade 1 diastolic dysfunction (abnormal relaxation) -->  LVEDP on Cath 28 mmHg & mild 2nd Pulm HTN   Diet-controlled type 2 diabetes mellitus (HCC)    Diverticulitis of colon (without mention of hemorrhage)(562.11)    Diverticulosis    Dyslipidemia, goal LDL below 70    Intolerant to statins   GERD (gastroesophageal reflux disease)    Hepatitis ~ 1957   yellow jaundice (01/14/2017)   Hepatomegaly    Hiatal hernia    History of blood transfusion 04/2012   when  I had a heart attack   History of kidney stones    I've got a stone embedded in one of my kidneys (09/09/2017)   History of radiation therapy    Left Knee - 03/05/23-04/30/23- Dr. Retta Caster   History of stomach ulcers years ago   Irritable bowel syndrome (IBS)    Labile essential hypertension    Partially related to RAS   Mesenteric artery stenosis (HCC)    95% Celiac Artery - ostial, 20-30% SMA.  Bilateral Renal A: L RA stent patent, R RA 20-30% -- Conservative Management   Mobitz type 2 second degree AV block    Status post PPM placement   NSTEMI (non-ST elevated myocardial infarction) (HCC) 04/2012   Unclear the details, apparently this was postoperative from her back surgery that she was cleared for my last saw her in June.  Reportedly had stents placed   Pancreas divisum    On pancreatic enzyme   PAT (paroxysmal atrial tachycardia) (HCC)    Renal artery stenosis (HCC) 2011; 12/'13   a) Angiogram 02/03/12:  50-60%L RA stenosis, 40% R R Inferior artery; b) 12/'13: S/P L RA Stent ([facility]) 6.0 mm x 15 mm; c) Renal Duplex 10/2013: <60% L RA, <60 R RA, ~60% SMA & Celiac A.   S/P placement of cardiac pacemaker 07/2021   [employer]   Stricture and stenosis of esophagus    Thyroid  nodule    Unspecified gastritis and gastroduodenitis without mention of hemorrhage     Past Surgical History:  Past Surgical History:  Procedure Laterality Date   APPENDECTOMY     BACK SURGERY     BREAST DUCTAL SYSTEM EXCISION     right   BREAST SURGERY Right    CARDIAC CATHETERIZATION N/A 03/16/2015   Procedure: Left Heart Cath and Coronary Angiography;  Surgeon: Arleen Lacer, MD;  Location: [facility] INVASIVE CV LAB;  Service: Cardiovascular; Widely patent m-dLAD stents as wellas pRCA stent.  High LVEDP, small Diag & Om vessels with moderate stenosis    Cardiac Event Monitor  01/2017   Mostly NSR with occasional sinus tachycardia and rare bradycardia. No A. fib. No PND or PSVT. Rare PACs and PVCs.    CATARACT EXTRACTION W/ INTRAOCULAR LENS  IMPLANT, BILATERAL Bilateral    CHOLECYSTECTOMY OPEN     COLONOSCOPY  03/01/2009   normal    CORONARY ANGIOPLASTY WITH STENT PLACEMENT  07/30/2010   3.0x58mm Integrity BMS to RCA and 2.5x 15mm Integrity BMS LAD.     CORONARY ANGIOPLASTY WITH STENT PLACEMENT  03/18/2011   Cutting Balloon PTCA of D2 (jailed - 80% ostial stenosis), DES PCI of mid LAD ISR - > Promus DES 2.5 x 16 mm postdilated to 2.8 mm) covering the proximal portion of the previous stent   DOBUTAMINE  STRESS ECHO  03/07/2015   [facility] (ordered for pre-op evaluation for EUS/ERCP --> abnormal EKG:  strreesss test shhoowwed 1 mm ST segment depressions downsloping. No wall motion abnormalities at peak exercise or at rest. Diastolic dysfunction  was noted but normal systolic function - EF greater than 55%. No bouts of regurgitation or stenosis. Resting hypertension with exaggerated response   ESOPHAGOGASTRODUODENOSCOPY  04/08/2012   ESOPHAGOGASTRODUODENOSCOPY (EGD) WITH ESOPHAGEAL DILATION  X 2   FRACTURE SURGERY     HAMMER TOE SURGERY Bilateral    took bone off the top of 2nd toe on each foot   HIP SURGERY Left    something to do w/my back   JOINT REPLACEMENT     KNEE SURGERY Left    had fluid drained off it a couple times   LEFT HEART CATH AND CORONARY ANGIOGRAPHY  07/30/2010   severe LAD-diagonal 80%, moderate to severe proximal RCA.  Mean PAP 15 mmHg.  PCWP mean 17 mmHg.  RVP 45/11 mmHg.  PAP 47/24 mmHg, mean 31 mmHg.  LVEDP 27 mmHg   LEFT HEART CATHETERIZATION WITH CORONARY ANGIOGRAM N/A 02/03/2012   Procedure: LEFT HEART CATHETERIZATION WITH CORONARY ANGIOGRAM;  Surgeon: Arleen Lacer, MD;  Location: [facility] CATH LAB; widely patent LAD and RCA stents.  Patent D2 ostium.  Moderate L Renal A stenosis (56%), R Renal A 40-50% t.  LVEDP 20 mmHg.   LEFT HEART CATHETERIZATION WITH CORONARY ANGIOGRAM N/A 05/12/2014   Procedure: LEFT HEART CATHETERIZATION WITH CORONARY ANGIOGRAM;  Surgeon:  Avanell Leigh, MD;  Location: [facility] CATH LAB;  Service: Cardiovascular: Stable CAD. Patent stents. Patent renal artery stent   LEFT HEART CATHETERIZATION WITH CORONARY ANGIOGRAM  08/2012   Peri-Op MI @ [facility] -- 40% ostial D1, patent LAD stents, 10% RCA ISR   LEFT HEART CATHETERIZATION WITH CORONARY ANGIOGRAM   03/18/2011   70% ISR of LAD stent just after D2 (D2 has ostial 80 to 90% stenosis.  20 to 30% ISR RCA.  EDP elevated at 28 mmHg   NM MYOVIEW  LTD  12/2016   LOW RISK study. No ischemia or infarction. EF 65-75%.   NM MYOVIEW  LTD  01/05/2020    LOW RISK. EF 60-65%.  No EKG changes.  No infarct, no ischemia.   PACEMAKER IMPLANT N/A 07/09/2021   Procedure: PACEMAKER IMPLANT;  Surgeon: Luana Rumple, MD;  Location: [facility] INVASIVE CV LAB;  Service: Cardiovascular;  Laterality: N/A;   PANCREAS SURGERY     stent in my pancreas; Dr Dayla Eva   PERCUTANEOUS PINNING PHALANX FRACTURE OF HAND Left ~ 2013   PERIPHERAL VASCULAR BALLOON ANGIOPLASTY  09/09/2017   Procedure: PERIPHERAL VASCULAR BALLOON ANGIOPLASTY;  Surgeon: Margherita Shell, MD;  Location: [facility] INVASIVE CV LAB;  Service: Cardiovascular;;  Celiac instent   PERIPHERAL VASCULAR INTERVENTION  01/14/2017   Procedure: Peripheral Vascular Intervention;  Surgeon: Margherita Shell, MD;  Location: [facility] INVASIVE CV LAB;  Service: Cardiovascular;;  mesentric   RENAL ARTERY STENT Left 08/2012   @ [facility] - 6.0 mm x 15 mm   SPINE SURGERY     tumor removed 07/2010; Redo Surgery 04/2012; Sacroiliac Sgx 08/2013   TOTAL ABDOMINAL HYSTERECTOMY     TOTAL KNEE ARTHROPLASTY Left ~ 2012   TRANSTHORACIC ECHOCARDIOGRAM  07/10/2021   (Postop PPM): Normal LV size and function.  EF 60 to 65%.  No RWMA.  GR 1 DD.  Mild LA dilation.  Normal RV size and function.  Normal RAP.  Normal aortic and mitral valves.  No pericardial effusion.   VISCERAL ANGIOGRAM N/A 05/12/2014   Procedure: VISCERAL ANGIOGRAM;  Surgeon: Avanell Leigh, MD;   Location: [facility] CATH LAB;  25% ostial proximal celiac artery with  downward takeoff.  23% proximal SMA and 56% proximal IMA.  Left renal artery stent widely patent.  Right renal artery is 20 to 30% proximal stenosis.   VISCERAL ANGIOGRAPHY N/A 01/14/2017   Procedure: Mesenteric  Angiography;  Surgeon: Margherita Shell, MD;  Location: [facility] INVASIVE CV LAB;  Service: Cardiovascular;  Laterality: N/A;   VISCERAL ANGIOGRAPHY N/A 09/09/2017   Procedure: VISCERAL ANGIOGRAPHY;  Surgeon: Margherita Shell, MD;  Location: [facility] INVASIVE CV LAB;  Service: Cardiovascular;  Laterality: N/A;    Allergies:  Allergies  Allergen Reactions   Codeine Phosphate Anaphylaxis, Shortness Of Breath and Swelling   Contrast Media [Iodinated Contrast Media] Swelling    Swelling of the face. Reaction was to ionic contrast many years ago. Patient has had non-ionic contrast many time without pre medication and has had no reaction.    Statins Other (See Comments)    Hyperactivity   Tramadol Palpitations and Other (See Comments)    Hyperactivity    Family History:  Family History  Problem Relation Age of Onset   Stroke Mother    Cancer Father        mets   Heart attack Father    Heart disease Father    Heart attack Brother    Diabetes Brother    Heart attack Brother    Colitis Maternal Grandfather    Cancer Daughter    Colon cancer Neg Hx    Stomach cancer Neg Hx     Social History:  Social History   Tobacco Use   Smoking status: Never   Smokeless tobacco: Never  Vaping Use   Vaping status: Never Used  Substance Use Topics   Alcohol use: No   Drug use: No    ROS: Constitutional:  Negative for fever, chills, weight loss CV: Negative for chest pain, previous MI, hypertension Respiratory:  Negative for shortness of breath, wheezing, sleep apnea, frequent cough GI:  Negative for nausea, vomiting, bloody stool, GERD  Physical exam: BP (!) 147/80   Pulse 93   Ht 5' 8 (1.727 m)   Wt 196 lb (88.9 kg)    BMI 29.80 kg/m  GENERAL APPEARANCE:  Well appearing, well developed, well nourished, NAD HEENT:  Atraumatic, normocephalic, oropharynx clear NECK:  Supple without lymphadenopathy or thyromegaly ABDOMEN:  Soft, non-tender, no masses EXTREMITIES:  Moves all extremities well, without clubbing, cyanosis, or edema NEUROLOGIC:  Alert and oriented x 3, normal gait, CN II-XII grossly intact MENTAL STATUS:  appropriate BACK:  Non-tender to palpation, No CVAT SKIN:  Warm, dry, and intact  Results: U/A: 0-5 WBCs, 0-2 RBCs

## 2024-02-19 NOTE — Progress Notes (Signed)
 Phone [phone] In person visit   Subjective:   [name] is a 83 y.o. 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    Priority: High   Osteoporosis 01/22/2022    Priority: High   Pacemaker 07/09/2021    Priority: High   Mobitz type 2 second degree atrioventricular block 06/20/2021    Priority: High   PAT (paroxysmal atrial tachycardia) (HCC) 06/20/2021    Priority: High   PAF (paroxysmal atrial fibrillation) (HCC) 10/16/2020    Priority: High   PAD (peripheral artery disease) (HCC) 01/14/2017    Priority: High   Type 2 diabetes mellitus with peripheral neuropathy (HCC) 10/09/2016    Priority: High   Mesenteric artery stenosis (HCC) 04/20/2014    Priority: High   Abdominal pain, epigastric 01/19/2014    Priority: High   Congestive heart failure with LV diastolic dysfunction, NYHA class 2 (HCC)     Priority: High   Celiac artery stenosis: 60% by Duplex - 90-95% by cath - Med Rx 11/27/2013    Priority: High   Renal artery stenosis (HCC) 08/29/2012    Priority: High   Coronary artery disease involving native coronary artery of native heart with angina pectoris (HCC) 10/30/2011    Priority: High   CAD S/P percutaneous coronary angioplasty: pRCA BMS, mLAD BMS overlapped prox with DES for ISR 07/30/2010    Priority: High   B12 deficiency 01/10/2022    Priority: Medium    Continuous severe abdominal pain 10/11/2016    Priority: Medium    Exertional dyspnea 03/16/2015    Priority: Medium    Idiopathic chronic pancreatitis suspected 03/10/2014    Priority: Medium    Abnormal CT of liver-possible cirrhosis 03/10/2014    Priority: Medium    Stasis edema of bilateral lower extremity      Priority: Medium    Essential hypertension     Priority: Medium    Hyperlipidemia associated with type 2 diabetes mellitus (HCC)     Priority: Medium    Myalgia due to statin 12/29/2011    Priority: Medium    GERD (gastroesophageal reflux disease) 01/24/2009    Priority: Medium    Hypothyroid 11/12/2007    Priority: Medium    Irritable bowel syndrome 11/12/2007    Priority: Medium    PANCREAS DIVISUM 11/12/2007    Priority: Medium    Allergic rhinitis 01/10/2022    Priority: Low   Pneumothorax 07/10/2021    Priority: Low   Syncope and collapse 06/20/2021    Priority: Low   Hallux valgus (acquired), right foot 08/17/2018    Priority: Low   Costochondritis 01/21/2017    Priority: Low   Onychomycosis due to dermatophyte 11/16/2015    Priority: Low   Abnormal electrocardiogram during exercise stress test 03/15/2015    Priority: Low   Dysphagia 03/10/2014    Priority: Low   Carotid stenosis 02/27/2014    Priority: Low   Leg cramps 01/19/2014    Priority: Low   Palpitations 01/12/2014    Priority: Low   Diarrhea due to malabsorption 01/24/2009    Priority: Low   Nephrolithiasis 11/12/2007    Priority: Low   HIATAL HERNIA 11/20/2005    Priority:  Low   Gross hematuria 11/19/2023   Hypercoagulable state due to paroxysmal atrial fibrillation (HCC) 10/01/2023   Nodular fasciitis 12/20/2022    Medications- reviewed and updated Current Outpatient Medications  Medication Sig Dispense Refill   alendronate  (FOSAMAX ) 70 MG tablet TAKE 1 TABLET (70 MG TOTAL) BY MOUTH EVERY 7 DAYS WITH FULL GLASS WATER ON EMPTY STOMACH 12 tablet 3   allopurinol  (ZYLOPRIM ) 100 MG tablet TAKE 1 TABLET BY MOUTH EVERY DAY 90 tablet 2   amiodarone  (PACERONE ) 200 MG tablet TAKE 400 MG (2 TABLETS) ONE DOSE AS NEED FOR A FAST HEART RATE ,IF AFTER 12 HOURS STILL PRESENT TAKE 200 MG ( 1 TABLET) ONE DOSE FOR EACH EPISODE 180 tablet 1   apixaban  (ELIQUIS ) 5 MG TABS tablet Take 1 tablet (5 mg total) by  mouth 2 (two) times daily. 180 tablet 3   Artificial Tear Solution (TEARS RENEWED OP) Apply 1 drop to eye daily as needed (dry eyes).     carvedilol  (COREG ) 12.5 MG tablet Take 1 tablet (12.5 mg total) by mouth 2 (two) times daily. May take an additional 12.5 mg if needed for extra palp. 180 tablet 3   colchicine 0.6 MG tablet Take by mouth.     cyanocobalamin  (VITAMIN B12) 1000 MCG/ML injection 1000 MCG INJECTION ONCE PER MONTH. PLEASE PROVIDE SYRINGES AS WELL 3 mL 5   diclofenac sodium (VOLTAREN) 1 % GEL Apply 2 g topically 4 (four) times daily as needed (pain).     furosemide  (LASIX ) 80 MG tablet TAKE 1 TABLET(80 MG) BY MOUTH ONCE A DAY , MAY TAKE AN ADDITIONAL 1 TABLET IF NEEDED FOR SWELLING 180 tablet 3   HYDROcodone -acetaminophen  (NORCO/VICODIN) 5-325 MG tablet Take 1 tablet by mouth every 6 (six) hours as needed for moderate pain (pain score 4-6). 30 tablet 0   isosorbide  mononitrate (IMDUR ) 60 MG 24 hr tablet Take 1 tablet (60 mg total) by mouth daily. May take an extra dose if you use Nitroglycerin   sublingual tablet for 2 days afterwards 100 tablet 3   lidocaine  (XYLOCAINE ) 5 % ointment Apply 1 application. topically as needed. 35.44 g 0   losartan  (COZAAR ) 50 MG tablet TAKE 1 TABLET BY MOUTH EVERY DAY 90 tablet 3   nitroGLYCERIN  (NITROSTAT ) 0.4 MG SL tablet DISSOLVE 1 TABLET UNDER THE TONGUE EVERY 5 MINUTES AS NEEDED FOR CHEST PAIN 25 tablet 6   nystatin  (MYCOSTATIN /NYSTOP ) powder Apply 1 Application topically 3 (three) times daily. 30 g 2   solifenacin (VESICARE) 10 MG tablet Take 1 tablet (10 mg total) by mouth daily. 30 tablet 11   spironolactone  (ALDACTONE ) 25 MG tablet TAKE 1 TABLET BY MOUTH AS DIRECTED. TAKE 1 TABLET ON MONDAYS, WEDNESDAYS, AND FRIDAYS AT 6 PM. 45 tablet 1   hyoscyamine  (ANASPAZ ) 0.125 MG TBDP disintergrating tablet Place 1 tablet (0.125 mg total) under the tongue every 6 (six) hours as needed (abdominal cramping). 30 tablet 5   meclizine  (ANTIVERT ) 12.5 MG tablet  Take 1 tablet (12.5 mg total) by mouth 2 (two) times daily as needed for dizziness. 60 tablet 2   No current facility-administered medications for this visit.     Objective:  BP (!) 128/50   Pulse 76   Temp 99 F (37.2 C) (Temporal)   Resp 18   Ht 5' 8 (1.727 m)   Wt 206 lb 2 oz (93.5 kg)   SpO2 95%   BMI 31.34 kg/m  Gen: NAD, resting comfortably CV: RRR no murmurs rubs or gallops Lungs: CTAB no  crackles, wheeze, rhonchi - bulge behind left knee  Ext: trace to 1+ edema under compression Skin: warm, dry  Diabetic foot exam was performed with the following findings:   No deformities, ulcerations, or other skin breakdown Intact posterior tibialis and dorsalis pedis pulses No sensation with monofilament until nearly the knee   Sees podiatry as well    Assessment and Plan    # Osteoporosis-she has not been taking her Fosamax -agrees to restart  # Gross hematuria- saw Dr. Willye Harvey- prior negative evaluation for malignancy (nonobstructing stone noted unchanged)  in April 2025- they opted of the tryto treat overactive bladder symptoms with solifenacin-she has not started this yet  # Fatigue-patient notices some fatigue but reports has not taken her B12 injection lately- She agrees to check B12 today  # Primary myxofibrosarcoma-has regular follow-up with Dr. Maria Shiner- -Prior resection with margins positive and had radiotherapy.  Unfortunately high-grade.  Pending CT scan and aware of risk of recurrence -Has completed radiation  -MRI of the knee ordered in March 2025 - completd in April and no recurrence of cancer thankfully but does have a small fluid collection  #Chronic abdominal pain-continues to bother her-for some reason she has only been receiving 7 hyoscyamine  at a time-we resent this as 30 tablets.  She reports it is helpful.  Continues to have some diarrhea on a chronic basis over the last 40 years though  #CAD with angina- on imdur - despite PCI x2 (last  2011)  #hyperlipidemia but with myalgias related to statins #Mobitz type 2 second degree AV block s/p pacemaker November 2022 S: Medication: imdur  60 mg, coreg  12.5 mg BID, on eliquis  5 mg  BID  -plavix  discontinued.  Reports does still get intermittent chest pains but these are stable - no cholesterol treatment currently-statin intolerant and failed other lipid management - has not tried pcsk9 inhibitors and declines  Lab Results  Component Value Date   CHOL 229 (H) 01/10/2022   HDL 36.90 (L) 01/10/2022   LDLCALC 152 (H) 09/08/2019   LDLDIRECT 147.0 01/10/2022   TRIG 327.0 (H) 01/10/2022   CHOLHDL 6 01/10/2022   A/P: CAD with stable angina-has upcoming visit with cardiology.  Continue Imdur , carvedilol  and Eliquis  -Lipids above goal but declines statin and all other treatments-utility of checking cholesterol is low  # Atrial fibrillation- discovered by pacemaker interrogation (very symptomatic) S: Rate controlled on coreg  12.5 mg BID (extra tablet with palpitations) - on amiodarone  400 mg with fast HR, additional 200 12 hours later if persists per rx Anticoagulated with eliquis  5 mg BID A/P: appropriately anticoagulated and rate controlled- continue current medicine  # Diabetes S: Medication:Diet controlled Lab Results  Component Value Date   HGBA1C 5.9 (A) 11/13/2023   HGBA1C 6.0 (A) 08/07/2023   HGBA1C 6.1 (A) 05/01/2023  A/P: Diabetes well-controlled-continue current medication   #CHF diastolic, NYHA class III-Does have some edema and weight is up a few pounds on our scale so we need to monitor this with CHF #hypertension-well-controlled today on Lasix  80 mg twice daily, losartan  50 mg, Imdur  60 mg and carvedilol  12.5 mg twice daily  # B12 deficiency S: Current treatment/medication (oral vs. IM): injections once a month in past- wants to try B12 OTC (available over the counter without a prescription) 1000 mcg but has not been doing this or injections lately A/P: Having  fatigue and as above check B12   Recommended follow up: Return in about 14 weeks (around 05/27/2024) for followup or sooner if needed.Schedule b4 you leave.  Future Appointments  Date Time Provider Department Center  02/23/2024  4:00 PM Arleen Lacer, MD [facility]  02/25/2024 12:15 PM [facility] None  02/25/2024 12:30 PM Ennever, Sherryll Donald, MD [facility] None  03/11/2024  4:00 PM Retta Caster, MD [facility] None  03/23/2024  3:40 PM [facility] ANNUAL WELLNESS VISIT 1 [facility] PEC  04/14/2024  7:05 AM [facility] DEVICE REMOTES [facility]  04/21/2024  1:00 PM Stoneking, Ponce Brisker., MD [facility] None  06/01/2024  3:00 PM Almira Jaeger, MD [facility] PEC  07/14/2024  7:05 AM [facility] DEVICE REMOTES [facility]  10/13/2024  7:05 AM [facility] DEVICE REMOTES [facility]  01/12/2025  7:05 AM [facility] DEVICE REMOTES [facility]  04/13/2025  7:05 AM [facility] DEVICE REMOTES [facility]  07/13/2025  7:05 AM [facility] DEVICE REMOTES [facility]    Lab/Order associations:   ICD-10-CM   1. Type 2 diabetes mellitus with peripheral neuropathy (HCC)  E11.42 Comprehensive metabolic panel with GFR    Hemoglobin A1c    2. Benign paroxysmal positional vertigo, unspecified laterality  H81.10     3. B12 deficiency  E53.8 Vitamin B12    4. CAD S/P percutaneous coronary angioplasty: pRCA BMS, mLAD BMS overlapped prox with DES for ISR  I25.10    Z98.61     5. Congestive heart failure with LV diastolic dysfunction, NYHA class 2 (HCC)  I50.30     6. PAF (paroxysmal atrial fibrillation) (HCC)  I48.0       Meds ordered this encounter  Medications   meclizine  (ANTIVERT ) 12.5 MG tablet    Sig: Take 1 tablet (12.5 mg total) by mouth 2 (two) times daily as needed for dizziness.    Dispense:  60 tablet    Refill:  2   hyoscyamine  (ANASPAZ ) 0.125 MG TBDP disintergrating tablet    Sig: Place 1 tablet (0.125 mg total) under the tongue every 6 (six) hours as needed (abdominal cramping).    Dispense:  30  tablet    Refill:  5    Return precautions advised.  Clarisa Crooked, MD

## 2024-02-20 ENCOUNTER — Ambulatory Visit: Payer: Self-pay | Admitting: Family Medicine

## 2024-02-20 LAB — COMPREHENSIVE METABOLIC PANEL WITH GFR
ALT: 7 U/L (ref 0–35)
AST: 10 U/L (ref 0–37)
Albumin: 4.1 g/dL (ref 3.5–5.2)
Alkaline Phosphatase: 63 U/L (ref 39–117)
BUN: 46 mg/dL — ABNORMAL HIGH (ref 6–23)
CO2: 29 meq/L (ref 19–32)
Calcium: 9.4 mg/dL (ref 8.4–10.5)
Chloride: 100 meq/L (ref 96–112)
Creatinine, Ser: 1.13 mg/dL (ref 0.40–1.20)
GFR: 45.5 mL/min — ABNORMAL LOW (ref >60.00)
Glucose, Bld: 108 mg/dL — ABNORMAL HIGH (ref 70–99)
Potassium: 4.3 meq/L (ref 3.5–5.1)
Sodium: 138 meq/L (ref 135–145)
Total Bilirubin: 0.4 mg/dL (ref 0.2–1.2)
Total Protein: 7.1 g/dL (ref 6.0–8.3)

## 2024-02-20 LAB — VITAMIN B12: Vitamin B-12: 250 pg/mL (ref 211–911)

## 2024-02-20 LAB — HEMOGLOBIN A1C: Hgb A1c MFr Bld: 6.2 % (ref 4.6–6.5)

## 2024-02-23 ENCOUNTER — Encounter: Payer: Self-pay | Admitting: Cardiology

## 2024-02-23 ENCOUNTER — Ambulatory Visit: Payer: Medicare Other | Attending: Cardiology | Admitting: Cardiology

## 2024-02-23 VITALS — BP 168/68 | HR 84 | Ht 68.0 in | Wt 196.0 lb

## 2024-02-23 DIAGNOSIS — E785 Hyperlipidemia, unspecified: Secondary | ICD-10-CM | POA: Diagnosis not present

## 2024-02-23 DIAGNOSIS — T466X5A Adverse effect of antihyperlipidemic and antiarteriosclerotic drugs, initial encounter: Secondary | ICD-10-CM | POA: Insufficient documentation

## 2024-02-23 DIAGNOSIS — M791 Myalgia, unspecified site: Secondary | ICD-10-CM | POA: Diagnosis not present

## 2024-02-23 DIAGNOSIS — I25119 Atherosclerotic heart disease of native coronary artery with unspecified angina pectoris: Secondary | ICD-10-CM | POA: Insufficient documentation

## 2024-02-23 DIAGNOSIS — T466X5D Adverse effect of antihyperlipidemic and antiarteriosclerotic drugs, subsequent encounter: Secondary | ICD-10-CM

## 2024-02-23 DIAGNOSIS — I441 Atrioventricular block, second degree: Secondary | ICD-10-CM | POA: Insufficient documentation

## 2024-02-23 DIAGNOSIS — I5030 Unspecified diastolic (congestive) heart failure: Secondary | ICD-10-CM | POA: Insufficient documentation

## 2024-02-23 DIAGNOSIS — D6869 Other thrombophilia: Secondary | ICD-10-CM | POA: Insufficient documentation

## 2024-02-23 DIAGNOSIS — I87302 Chronic venous hypertension (idiopathic) without complications of left lower extremity: Secondary | ICD-10-CM | POA: Diagnosis not present

## 2024-02-23 DIAGNOSIS — R0989 Other specified symptoms and signs involving the circulatory and respiratory systems: Secondary | ICD-10-CM | POA: Diagnosis not present

## 2024-02-23 DIAGNOSIS — I480 Paroxysmal atrial fibrillation: Secondary | ICD-10-CM | POA: Diagnosis not present

## 2024-02-23 DIAGNOSIS — I2510 Atherosclerotic heart disease of native coronary artery without angina pectoris: Secondary | ICD-10-CM | POA: Diagnosis not present

## 2024-02-23 DIAGNOSIS — Z9861 Coronary angioplasty status: Secondary | ICD-10-CM | POA: Diagnosis not present

## 2024-02-23 DIAGNOSIS — E1169 Type 2 diabetes mellitus with other specified complication: Secondary | ICD-10-CM | POA: Diagnosis not present

## 2024-02-23 DIAGNOSIS — R0609 Other forms of dyspnea: Secondary | ICD-10-CM | POA: Diagnosis not present

## 2024-02-23 NOTE — Patient Instructions (Addendum)
 Medication Instructions:    Take extra dose of Losartan  if blood pressure greater than 160  more than one  hour  *If you need a refill on your cardiac medications before your next appointment, please call your pharmacy*   Lab Work: Not needed If you have labs (blood work) drawn today and your tests are completely normal, you will receive your results only by: MyChart Message (if you have MyChart) OR A paper copy in the mail If you have any lab test that is abnormal or we need to change your treatment, we will call you to review the results.   Testing/Procedures: Recommend you having a PET SCAN at [facility]  .A cardiac PET scan is an accurate, noninvasive test that creates images of your heart. A healthcare provider can use these images to make decisions about the next steps in your heart care. They can judge how healthy your heart muscle is and decide how to treat it. A cardiac PET scan uses a small amount of radiation   Follow-Up: At [facility], you and your health needs are our priority.  As part of our continuing mission to provide you with exceptional heart care, we have created designated Provider Care Teams.  These Care Teams include your primary Cardiologist (physician) and Advanced Practice Providers (APPs -  Physician Assistants and Nurse Practitioners) who all work together to provide you with the care you need, when you need it.     Your next appointment:   3 month(s)  The format for your next appointment:   In Person  Provider:   Alm Clay, MD   Other Instructions      Please report to Radiology at the [facility] Main Entrance 30 minutes early for your test.  [address] [place], [place] [address]  How to Prepare for Your Cardiac PET/CT Stress Test:  Nothing to eat or drink, except water, 3 hours prior to arrival time.  NO caffeine/decaffeinated products, or chocolate 12 hours prior to arrival. (Please note decaffeinated  beverages (teas/coffees) still contain caffeine).  If you have caffeine within 12 hours prior, the test will need to be rescheduled.  Medication instructions: Do not take erectile dysfunction medications for 72 hours prior to test (sildenafil, tadalafil) Do not take nitrates (isosorbide  mononitrate, Ranexa) the day before or day of test Do not take tamsulosin the day before or morning of test Hold theophylline containing medications for 12 hours. Hold Dipyridamole 48 hours prior to the test. Hold Furosemide  (lasix ) and Spironolactone  (aldactone ) until after this test  Diabetic Preparation: If able to eat breakfast prior to 3 hour fasting, you may take all medications, including your insulin. Do not worry if you miss your breakfast dose of insulin - start at your next meal. If you do not eat prior to 3 hour fast-Hold all diabetes (oral and insulin) medications. Patients who wear a continuous glucose monitor MUST remove the device prior to scanning.  You may take your remaining medications with water.  NO perfume, cologne or lotion on chest or abdomen area. FEMALES - Please avoid wearing dresses to this appointment.  Total time is 1 to 2 hours; you may want to bring reading material for the waiting time.  IF YOU THINK YOU MAY BE PREGNANT, OR ARE NURSING PLEASE INFORM THE TECHNOLOGIST.  In preparation for your appointment, medication and supplies will be purchased.  Appointment availability is limited, so if you need to cancel or reschedule, please call the Radiology  Department Scheduler at [phone] 24 hours in advance to avoid a cancellation fee of $100.00  What to Expect When you Arrive:  Once you arrive and check in for your appointment, you will be taken to a preparation room within the Radiology Department.  A technologist or Nurse will obtain your medical history, verify that you are correctly prepped for the exam, and explain the procedure.  Afterwards, an IV will be started in  your arm and electrodes will be placed on your skin for EKG monitoring during the stress portion of the exam. Then you will be escorted to the PET/CT scanner.  There, staff will get you positioned on the scanner and obtain a blood pressure and EKG.  During the exam, you will continue to be connected to the EKG and blood pressure machines.  A small, safe amount of a radioactive tracer will be injected in your IV to obtain a series of pictures of your heart along with an injection of a stress agent.    After your Exam:  It is recommended that you eat a meal and drink a caffeinated beverage to counter act any effects of the stress agent.  Drink plenty of fluids for the remainder of the day and urinate frequently for the first couple of hours after the exam.  Your doctor will inform you of your test results within 7-10 business days.  For more information and frequently asked questions, please visit our website: [email or web address]  For questions about your test or how to prepare for your test, please call: Cardiac Imaging Nurse Navigators Office: [phone]

## 2024-02-23 NOTE — Progress Notes (Unsigned)
 Cardiology Office Note:  .   Date:  02/24/2024  ID:  [name], DOB [birth year], MRN [id number] PCP: Katrinka Garnette KIDD, MD  [facility] Providers Cardiologist:  Alm Clay, MD Electrophysiologist:  Jerel Balding, MD     Chief Complaint  Patient presents with   Follow-up   Coronary Artery Disease    Noting worsening exertional dyspnea that with chest pain   Atrial Fibrillation    No prolonged spells    Patient Profile: .     [name] is a pleasant 83 y.o. female with a PMH noted below who presents here for routine f/u  at the request of Katrinka Garnette KIDD, MD.  [name] is a mildly obese 83 y.o. female  with a detailed PMH reviewed below who presents here for 34-month follow-up at the request of Katrinka Garnette KIDD, MD.  CV History  CAD - 07/2010 - Progressive Angina: BMS PCI RCA & LAD (pre-op Back Sgx)  03/2011 Unstable Angina => LAD proximal ISR -> Overlapping DES w/ PTCA of jailed D2 02/2012: Patent Stents -> 08/2012 (peri-Op MI) patent stents Most recent cath was July 2016: Widely patent LAD stents with D2 jailed.  Minimal D2 stenosis.  Proximal to mid RCA BMS with less than 10% ISR.  OM1 and D1 60% stenosis.-Stable.  Myoview  May 2021: No ischemia or infarct.  LOW RISK => has chronic intermittent severe chest pain which she calls angina Chronic HFpEF Chronic bilateral LE edema Celiac Artery Stenosis - s/p Stent (2018) Bilateral Renal Atery Stenosis -> s/p L RA Stent (2011) Paroxysmal atrial tachycardia -> last anywhere from 30 seconds to 10 minutes. Symptomatic bradycardia with sinus pauses, Mobitz 2 AVB => s/p dual-chamber PPM Paroxysmal atrial fibrillation -> has short bursts lasting just a few hours. HLD-statin myopathy.  Unwilling to try any cholesterol medication      I last saw [name] on September 29, 2023 -> she was still having chest discomfort, blood pressure lability, palpitations and exertional dyspnea.  Had had blood pressures as  high as 180s.  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  Difficult to tell if her chest discomfort was any different than it had been.  Plan to continue current meds but low threshold to consider stress testing in future.  Recommended additional dose of carvedilol  if systolic pressures are greater than 150 mm agree.  Also recommended holding losartan  for blood pressures less than 110 mmHg.  She was then seen by Dr. Balding for [facility] interrogation, she again complained of chest discomfort with rushing him during more than usual exertion.  Sometimes taking nitroglycerin .  PPM showed 9.3% atrial pacing.  Less than 0.1% ventricular pacing.  However during her EKG there was AV sequential pacing.  No sustained A-fib in the last 12 months.  He contacted me to discuss possibility of a nuclear stress test, suggested coronary stress PET based on very high suspicion for symptomatic CAD.   Subjective  Discussed the use of AI scribe software for clinical note transcription with the patient, who gave verbal consent to proceed.  History of Present Illness History of Present Illness [name] is an 83 year old female with CAD, PAD, PAF, HTN with HFpEF who presents with palpitations, chest pain and shortness of breath with minimal exertion..  She experiences daily episodes of heart palpitations, described as her heart 'flopping'. These episodes are not prolonged and typically resolve when she sits down. The  palpitations sometimes cause pain, particularly when lying down, and occasionally during activity. She has not taken amiodarone  recently as there have been no prolonged episodes requiring it.  She then says that she has chest pressure and tightness in her chest that can sometimes happen at night when she is lying down and she just endures it .  She also says that she will have that chest discomfort breathlessness with minimal exertion.  She experiences shortness  of breath, particularly when walking short distances, such as from one room to another. She feels tired and breathless with minimal exertion. She does not wake up at night due to breathing difficulties, although she wakes up frequently to use the bathroom. She sleeps on two pillows and notes that her shortness of breath significantly impacts her daily life.  She has a history of hypertension, with recent blood pressure readings noted to be elevated. Her blood pressure fluctuates, sometimes causing headaches. She is currently taking losartan  and furosemide , with the latter at a dose of 80 mg twice daily.  She reports swelling, which is better than it has been in the past. She is taking furosemide  to manage this symptom. She mentions a recent prescription for a medication intended to reduce nighttime urination, although she has not yet started taking it.  She has a pacemaker but reports issues with the remote monitoring system, which has not been set up yet despite receiving a new device two weeks ago.   Cardiovascular ROS: positive for - chest pain, dyspnea on exertion, edema, irregular heartbeat, palpitations, rapid heart rate, and shortness of breath negative for - orthopnea, paroxysmal nocturnal dyspnea, or syncope or near syncope, TIA or almost fugax.  No claudication.  She does not have orthopnea but does not necessarily lie flat either.  ROS:  Review of Systems - Negative except symptoms noted above.    Objective    Studies Reviewed: SABRA        No recent studies Previous Pertinent Studies Lexiscan  Myoview  : Hyperdynamic LV EF 73%.  LOW RISK.  No RWMA.  (01/23/2017) Left Heart Cath and Coronary Angiography: 60% ostial D1 and OM1.  5 to 10% ISR in RCA and overlapping LAD stents with 30% proximal and 30 to 20% mid RCA on either side of the stent => added Imdur  for potential spasm (03/16/2015)  Risk Assessment/Calculations:    CHA2DS2-VASc Score = 5   This indicates a 7.2% annual risk of  stroke. The patient's score is based upon: CHF History: 0 HTN History: 1 Diabetes History: 0 Stroke History: 0 Vascular Disease History: 1 Age Score: 2 Gender Score: 1    HYPERTENSION CONTROL Vitals:   02/23/24 1555 02/23/24 1609  BP: (!) 190/70 (!) 168/68    The patient's blood pressure is elevated above target today.  In order to address the patient's elevated BP: The blood pressure is usually elevated in clinic.  Blood pressures monitored at home have been optimal.; Blood pressure will be monitored at home to determine if medication changes need to be made. (As previously documented, plan is to take extra dose of carvedilol  for SBP sustained > 150 mmHg -will recheck at home, and if still elevated, take additional dose.)           Physical Exam:   VS:  BP (!) 168/68   Pulse 84   Ht 5' 8 (1.727 m)   Wt 196 lb (88.9 kg)   SpO2 94%   BMI 29.80 kg/m    Wt Readings from Last  3 Encounters:  02/23/24 196 lb (88.9 kg)  02/19/24 206 lb 2 oz (93.5 kg)  02/19/24 196 lb (88.9 kg)    GEN: Well nourished, well groomed in no acute distress; relatively healthy appearing. NECK: No JVD; No carotid bruits CARDIAC: Normal S1, S2; RRR, no murmurs, rubs, gallops RESPIRATORY:  Clear to auscultation without rales, wheezing or rhonchi ; nonlabored, good air movement. ABDOMEN: Soft, non-tender, non-distended EXTREMITIES: Trivial ankle edema; No deformity      ASSESSMENT AND PLAN: .    Problem List Items Addressed This Visit       Cardiology Problems   CAD S/P percutaneous coronary angioplasty: pRCA BMS, mLAD BMS overlapped prox with DES for ISR (Chronic)   BMS to the RCA and overlapping BMS/DES in the LAD from 2011.  Patent on cath in 2013 as well as 2016. Concern now for potential progression of macrovascular disease versus worsening microvascular ischemia.  - Check Cardiac Stress PET -No aspirin  or Plavix  due to long-term Eliquis       Relevant Orders   NM PET CT CARDIAC  PERFUSION MULTI W/ABSOLUTE BLOODFLOW   Cardiac Stress Test: Informed Consent Details: Physician/Practitioner Attestation; Transcribe to consent form and obtain patient signature   Congestive heart failure with LV diastolic dysfunction, NYHA class 2 (HCC) (Chronic)   Start to tell if her symptoms are truly to CHF given her exertional dyspnea with no PND or orthopnea and trivial edema. Edema seems to be pretty well-controlled on her current medications including spironolactone  25 mg daily and high dose of furosemide  80 mg daily with occasional additional dosing      Relevant Orders   NM PET CT CARDIAC PERFUSION MULTI W/ABSOLUTE BLOODFLOW   Coronary artery disease involving native coronary artery of native heart with angina pectoris (HCC) - Primary (Chronic)   At this point she continues to have exertional dyspnea and chest discomfort which given her known CAD and potential for progression of disease without lipids being managed aggressively due to her medication intolerance, I think she has very high suspicion of progression of CAD.  I am also concerned that if there is not progression of macrovascular disease that there could be evidence of microvascular disease.  As such, I have chosen to order a Cardiac Stress PET/CT which will provide both macro and microvascular data showing myocardial flow reserve.  She remains on carvedilol  12.5 mg twice daily and 60 mg Imdur  for antianginal benefit.  Not using amlodipine because of worsening edema in the past - Order Cardiac Stress PET (see informed consent below) - Continue current dose of Imdur  and carvedilol  but allow for additional dose of carvedilol  for high blood pressures as indicated. - Continue losartan  50 mg daily although low threshold of titrating further.  Last time we titrated she had profound orthostatic dizziness and intermittent hypotension - Not on aspirin  or Plavix  because of Eliquis  - Unfortunately, as previously mentioned she is not on  anything for lipids.=> Sided history of myalgias to statins and also declined nonstatin options including Zetia, Nexletol as well as PCSK9 inhibitors. => She did not want to discuss Leqvio either.      Relevant Orders   NM PET CT CARDIAC PERFUSION MULTI W/ABSOLUTE BLOODFLOW   Cardiac Stress Test: Informed Consent Details: Physician/Practitioner Attestation; Transcribe to consent form and obtain patient signature   Hypercoagulable state due to paroxysmal atrial fibrillation (HCC) (Chronic)   CHA2DS2-VASc score 5-on Eliquis   -- Okay to hold Eliquis  2 to 3 days preop for surgeries or procedures.  Hyperlipidemia associated with type 2 diabetes mellitus (HCC) (Chronic)   Again intolerant to statins.  Has tried multiple statins and now at this point not agreeable to any medications.  No benefit from Lovaza  in the past.  Did not want to take Nexletol or Zetia.  Also declined PCSK9 inhibitors or Leqvio.  She understands the risks involved with not treating her lipids and is willing to take the risks.      Relevant Orders   NM PET CT CARDIAC PERFUSION MULTI W/ABSOLUTE BLOODFLOW   Labile hypertension (Chronic)   Blood pressure is very high today, but has previously been very low with titration of additional medications.  Did not tolerate amlodipine because of edema. Did not tolerate increased dose of losartan  because of orthostasis - Continue current dose of carvedilol  12.5 mg twice daily, losartan  50 mg daily, spironolactone  25 mg on Monday Wednesday Friday along with high dose of furosemide  80 mg twice daily. -Continue Imdur  60 mg - Check blood pressure at home. - If blood pressure remains above 160/100 mmHg (after 1 hour), take an extra dose of losartan .  (Or Carvedilol )      Mobitz type 2 second degree atrioventricular block (Chronic)   Status post PPM placement.  Allows for us  to monitor for recurrence of A-fib. Followed by Dr. Francyne      Relevant Orders   NM PET CT CARDIAC  PERFUSION MULTI W/ABSOLUTE BLOODFLOW   PAF (paroxysmal atrial fibrillation) (HCC) (Chronic)   Daily palpitations without prolonged episodes. Amiodarone  reserved for prolonged episodes. PPM interrogation did not show significant A-fib burden, I suspect she may be noting some atrial tachycardia that was also seen previously. - Rates pretty much controlled with carvedilol  12.5 mg twice daily - Use PRN amiodarone  if prolonged episodes occur. - On Eliquis       Relevant Orders   NM PET CT CARDIAC PERFUSION MULTI W/ABSOLUTE BLOODFLOW     Other   Exertional dyspnea (Chronic)   Progressively worsening exertional dyspnea now associated with chest discomfort.  Deafly need to exclude ischemia and given assessment of EF. -Check Cardiac Stress PET  Hopefully this will allow us  to delineate difference between ischemia versus HFpEF related symptoms      Myalgia due to statin (Chronic)   Initially tried statins in the past, but after having myalgias with 2 separate statins, adamantly refuses other cholesterol medications including nonstatin oral options such as Zetia or Nexletol.  She also has refused injectables including PCSK9 inhibitors and Leqvio.      Relevant Orders   NM PET CT CARDIAC PERFUSION MULTI W/ABSOLUTE BLOODFLOW   Stasis edema of bilateral lower extremity (Chronic)   Combination of HFpEF but also venous stasis.  Continue Lasix  at current dose along with spironolactone  but also recommend that she continue to wear support stockings.       Assessment and Plan Assessment & Plan Assessment and Plan Heart failure Dyspnea and fatigue likely due to heart failure. Advanced stress test planned to assess pump function and rule out ischemia. - Order advanced stress test (CT scan) to assess pump function and rule out ischemia. - Adjust treatment based on stress test results.  Atrial fibrillation   Edema Swelling improved with current furosemide  regimen. Extra doses considered if  swelling worsens or weight increases. - Consider extra dose of furosemide  if swelling worsens or weight increases.  Hypertension Current blood pressure elevated, possibly due to stress.   Recording duration: 20 minutes       Informed Consent  Shared Decision Making/Informed Consent The risks [chest pain, shortness of breath, cardiac arrhythmias, dizziness, blood pressure fluctuations, myocardial infarction, stroke/transient ischemic attack, nausea, vomiting, allergic reaction, radiation exposure, metallic taste sensation and life-threatening complications (estimated to be 1 in 10,000)], benefits (risk stratification, diagnosing coronary artery disease, treatment guidance) and alternatives of a cardiac PET stress test were discussed in detail with Ms. Derner and she agrees to proceed.      Follow-Up: No follow-ups on file.      Signed, Alm MICAEL Clay, MD, MS Alm Clay, M.D., M.S. Interventional Chartered certified accountant  Pager # [phone]

## 2024-02-24 ENCOUNTER — Encounter: Payer: Self-pay | Admitting: Cardiology

## 2024-02-24 NOTE — Assessment & Plan Note (Signed)
 BMS to the RCA and overlapping BMS/DES in the LAD from 2011.  Patent on cath in 2013 as well as 2016. Concern now for potential progression of macrovascular disease versus worsening microvascular ischemia.  - Check Cardiac Stress PET -No aspirin  or Plavix  due to long-term Eliquis 

## 2024-02-24 NOTE — Assessment & Plan Note (Signed)
 Start to tell if her symptoms are truly to CHF given her exertional dyspnea with no PND or orthopnea and trivial edema. Edema seems to be pretty well-controlled on her current medications including spironolactone  25 mg daily and high dose of furosemide  80 mg daily with occasional additional dosing

## 2024-02-24 NOTE — Assessment & Plan Note (Signed)
 Combination of HFpEF but also venous stasis.  Continue Lasix  at current dose along with spironolactone  but also recommend that she continue to wear support stockings.

## 2024-02-24 NOTE — Assessment & Plan Note (Signed)
 Initially tried statins in the past, but after having myalgias with 2 separate statins, adamantly refuses other cholesterol medications including nonstatin oral options such as Zetia or Nexletol.  She also has refused injectables including PCSK9 inhibitors and Leqvio.

## 2024-02-24 NOTE — Assessment & Plan Note (Addendum)
 Status post PPM placement.  Allows for us  to monitor for recurrence of A-fib. Followed by Dr. Francyne

## 2024-02-24 NOTE — Assessment & Plan Note (Signed)
 CHA2DS2-VASc score 5-on Eliquis   -- Okay to hold Eliquis  2 to 3 days preop for surgeries or procedures.

## 2024-02-24 NOTE — Assessment & Plan Note (Signed)
 At this point she continues to have exertional dyspnea and chest discomfort which given her known CAD and potential for progression of disease without lipids being managed aggressively due to her medication intolerance, I think she has very high suspicion of progression of CAD.  I am also concerned that if there is not progression of macrovascular disease that there could be evidence of microvascular disease.  As such, I have chosen to order a Cardiac Stress PET/CT which will provide both macro and microvascular data showing myocardial flow reserve.  She remains on carvedilol  12.5 mg twice daily and 60 mg Imdur  for antianginal benefit.  Not using amlodipine because of worsening edema in the past - Order Cardiac Stress PET (see informed consent below) - Continue current dose of Imdur  and carvedilol  but allow for additional dose of carvedilol  for high blood pressures as indicated. - Continue losartan  50 mg daily although low threshold of titrating further.  Last time we titrated she had profound orthostatic dizziness and intermittent hypotension - Not on aspirin  or Plavix  because of Eliquis  - Unfortunately, as previously mentioned she is not on anything for lipids.=> Sided history of myalgias to statins and also declined nonstatin options including Zetia, Nexletol as well as PCSK9 inhibitors. => She did not want to discuss Leqvio either.

## 2024-02-24 NOTE — Assessment & Plan Note (Signed)
 Daily palpitations without prolonged episodes. Amiodarone  reserved for prolonged episodes. PPM interrogation did not show significant A-fib burden, I suspect she may be noting some atrial tachycardia that was also seen previously. - Rates pretty much controlled with carvedilol  12.5 mg twice daily - Use PRN amiodarone  if prolonged episodes occur. - On Eliquis 

## 2024-02-24 NOTE — Assessment & Plan Note (Addendum)
 Progressively worsening exertional dyspnea now associated with chest discomfort.  Deafly need to exclude ischemia and given assessment of EF. -Check Cardiac Stress PET  Hopefully this will allow us  to delineate difference between ischemia versus HFpEF related symptoms

## 2024-02-24 NOTE — Assessment & Plan Note (Signed)
 Again intolerant to statins.  Has tried multiple statins and now at this point not agreeable to any medications.  No benefit from Lovaza  in the past.  Did not want to take Nexletol or Zetia.  Also declined PCSK9 inhibitors or Leqvio.  She understands the risks involved with not treating her lipids and is willing to take the risks.

## 2024-02-24 NOTE — Assessment & Plan Note (Addendum)
 Blood pressure is very high today, but has previously been very low with titration of additional medications.  Did not tolerate amlodipine because of edema. Did not tolerate increased dose of losartan  because of orthostasis - Continue current dose of carvedilol  12.5 mg twice daily, losartan  50 mg daily, spironolactone  25 mg on Monday Wednesday Friday along with high dose of furosemide  80 mg twice daily. -Continue Imdur  60 mg - Check blood pressure at home. - If blood pressure remains above 160/100 mmHg (after 1 hour), take an extra dose of losartan .  (Or Carvedilol )

## 2024-02-25 ENCOUNTER — Inpatient Hospital Stay: Attending: Hematology & Oncology

## 2024-02-25 ENCOUNTER — Inpatient Hospital Stay (HOSPITAL_BASED_OUTPATIENT_CLINIC_OR_DEPARTMENT_OTHER): Admitting: Hematology & Oncology

## 2024-02-25 ENCOUNTER — Encounter: Payer: Self-pay | Admitting: Hematology & Oncology

## 2024-02-25 VITALS — BP 128/48 | HR 76 | Temp 98.9°F | Resp 20 | Ht 68.0 in | Wt 204.4 lb

## 2024-02-25 DIAGNOSIS — Z08 Encounter for follow-up examination after completed treatment for malignant neoplasm: Secondary | ICD-10-CM | POA: Insufficient documentation

## 2024-02-25 DIAGNOSIS — Z85831 Personal history of malignant neoplasm of soft tissue: Secondary | ICD-10-CM | POA: Diagnosis not present

## 2024-02-25 DIAGNOSIS — C499 Malignant neoplasm of connective and soft tissue, unspecified: Secondary | ICD-10-CM

## 2024-02-25 LAB — CMP (CANCER CENTER ONLY)
ALT: 6 U/L (ref 0–44)
AST: 8 U/L — ABNORMAL LOW (ref 15–41)
Albumin: 4.3 g/dL (ref 3.5–5.0)
Alkaline Phosphatase: 61 U/L (ref 38–126)
Anion gap: 7 (ref 5–15)
BUN: 34 mg/dL — ABNORMAL HIGH (ref 8–23)
CO2: 29 mmol/L (ref 22–32)
Calcium: 9.6 mg/dL (ref 8.9–10.3)
Chloride: 101 mmol/L (ref 98–111)
Creatinine: 1.13 mg/dL — ABNORMAL HIGH (ref 0.44–1.00)
GFR, Estimated: 49 mL/min — ABNORMAL LOW (ref >60)
Glucose, Bld: 99 mg/dL (ref 70–99)
Potassium: 4.3 mmol/L (ref 3.5–5.1)
Sodium: 137 mmol/L (ref 135–145)
Total Bilirubin: 0.5 mg/dL (ref 0.0–1.2)
Total Protein: 6.7 g/dL (ref 6.5–8.1)

## 2024-02-25 LAB — CBC WITH DIFFERENTIAL (CANCER CENTER ONLY)
Abs Immature Granulocytes: 0.09 10*3/uL — ABNORMAL HIGH (ref 0.00–0.07)
Basophils Absolute: 0.1 10*3/uL (ref 0.0–0.1)
Basophils Relative: 1 %
Eosinophils Absolute: 0.2 10*3/uL (ref 0.0–0.5)
Eosinophils Relative: 3 %
HCT: 33.9 % — ABNORMAL LOW (ref 36.0–46.0)
Hemoglobin: 11.2 g/dL — ABNORMAL LOW (ref 12.0–15.0)
Immature Granulocytes: 1 %
Lymphocytes Relative: 21 %
Lymphs Abs: 1.5 10*3/uL (ref 0.7–4.0)
MCH: 30.2 pg (ref 26.0–34.0)
MCHC: 33 g/dL (ref 30.0–36.0)
MCV: 91.4 fL (ref 80.0–100.0)
Monocytes Absolute: 0.8 10*3/uL (ref 0.1–1.0)
Monocytes Relative: 11 %
Neutro Abs: 4.5 10*3/uL (ref 1.7–7.7)
Neutrophils Relative %: 63 %
Platelet Count: 257 10*3/uL (ref 150–400)
RBC: 3.71 MIL/uL — ABNORMAL LOW (ref 3.87–5.11)
RDW: 13.5 % (ref 11.5–15.5)
WBC Count: 7.1 10*3/uL (ref 4.0–10.5)
nRBC: 0 % (ref 0.0–0.2)

## 2024-02-25 LAB — LACTATE DEHYDROGENASE: LDH: 152 U/L (ref 98–192)

## 2024-02-25 NOTE — Progress Notes (Signed)
 [facility] Hematology and Oncology Follow Up Visit  [name] [id number] 1940-11-20 83 y.o. 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  Unfortunately, she feels that there is still something going on behind the right knee.  Again, she has had scans.  She has had MRI.  She had MRI of the left knee which was done on 12/22/2023.  This did not show any evidence of obvious suspicious recurrent tumor.  Again, which are how else we can try to figure out what is going on.  Maybe, a PET scan would not be a bad idea.  Hopefully, insurance will allow for this to happen..  Otherwise, she is feeling okay.  She has had no cough.  She has had no shortness of breath.  She has had no nausea or vomiting.  She has had no abdominal pain.  There is no change in bowel or bladder habits.  She has had problems with chronic diarrhea.  She is on quite a few medications so I am sure that with the medications that she takes probably is the source of this.  She has a performance that is right now probably ECOG 2.    Medications:  Current Outpatient Medications:    alendronate  (FOSAMAX ) 70 MG tablet, TAKE 1 TABLET (70 MG TOTAL) BY MOUTH EVERY 7 DAYS WITH FULL GLASS WATER ON EMPTY STOMACH, Disp: 12 tablet, Rfl: 3   allopurinol  (ZYLOPRIM ) 100 MG tablet, TAKE 1 TABLET BY MOUTH EVERY DAY, Disp: 90 tablet, Rfl: 2   apixaban  (ELIQUIS ) 5 MG TABS tablet, Take 1 tablet (5 mg total) by mouth 2 (two) times daily., Disp: 180 tablet, Rfl: 3   Artificial Tear Solution (TEARS RENEWED OP), Apply 1 drop to eye daily as needed (dry eyes)., Disp: , Rfl:    bismuth subsalicylate (PEPTO BISMOL) 262 MG/15ML suspension, Take 30 mLs by mouth every 6 (six) hours as needed for diarrhea or loose stools., Disp: , Rfl:    carvedilol  (COREG ) 12.5  MG tablet, Take 1 tablet (12.5 mg total) by mouth 2 (two) times daily. May take an additional 12.5 mg if needed for extra palp., Disp: 180 tablet, Rfl: 3   colchicine 0.6 MG tablet, Take by mouth. (Patient taking differently: Take by mouth. Takes with flare.), Disp: , Rfl:    cyanocobalamin  (VITAMIN B12) 1000 MCG tablet, Take 1,000 mcg by mouth daily., Disp: , Rfl:    diclofenac sodium (VOLTAREN) 1 % GEL, Apply 2 g topically 4 (four) times daily as needed (pain)., Disp: , Rfl:    furosemide  (LASIX ) 80 MG tablet, TAKE 1 TABLET(80 MG) BY MOUTH ONCE A DAY , MAY TAKE AN ADDITIONAL 1 TABLET IF NEEDED FOR SWELLING (Patient taking differently: Take 80 mg by mouth 2 (two) times daily.), Disp: 180 tablet, Rfl: 3   HYDROcodone -acetaminophen  (NORCO/VICODIN) 5-325 MG tablet, Take 1 tablet by mouth every 6 (six) hours as needed for moderate pain (pain score 4-6)., Disp: 30 tablet, Rfl: 0   hyoscyamine  (ANASPAZ ) 0.125 MG TBDP disintergrating tablet, Place 1 tablet (0.125 mg total) under the tongue every 6 (six) hours as needed (abdominal cramping)., Disp: 30 tablet, Rfl: 5   indapamide  (LOZOL ) 2.5 MG tablet, Take 2.5 mg by mouth daily., Disp: , Rfl:    isosorbide  mononitrate (IMDUR ) 60 MG 24 hr tablet, Take 1  tablet (60 mg total) by mouth daily. May take an extra dose if you use Nitroglycerin   sublingual tablet for 2 days afterwards, Disp: 100 tablet, Rfl: 3   lidocaine  (XYLOCAINE ) 5 % ointment, Apply 1 application. topically as needed., Disp: 35.44 g, Rfl: 0   loperamide (IMODIUM A-D) 2 MG tablet, Take 2 mg by mouth 4 (four) times daily as needed for diarrhea or loose stools., Disp: , Rfl:    losartan  (COZAAR ) 50 MG tablet, TAKE 1 TABLET BY MOUTH EVERY DAY, Disp: 90 tablet, Rfl: 3   meclizine  (ANTIVERT ) 12.5 MG tablet, Take 1 tablet (12.5 mg total) by mouth 2 (two) times daily as needed for dizziness., Disp: 60 tablet, Rfl: 2   nystatin  (MYCOSTATIN /NYSTOP ) powder, Apply 1 Application topically 3 (three) times  daily., Disp: 30 g, Rfl: 2   solifenacin  (VESICARE ) 10 MG tablet, Take 1 tablet (10 mg total) by mouth daily., Disp: 30 tablet, Rfl: 11   spironolactone  (ALDACTONE ) 25 MG tablet, TAKE 1 TABLET BY MOUTH AS DIRECTED. TAKE 1 TABLET ON MONDAYS, WEDNESDAYS, AND FRIDAYS AT 6 PM., Disp: 45 tablet, Rfl: 1   amiodarone  (PACERONE ) 200 MG tablet, TAKE 400 MG (2 TABLETS) ONE DOSE AS NEED FOR A FAST HEART RATE ,IF AFTER 12 HOURS STILL PRESENT TAKE 200 MG ( 1 TABLET) ONE DOSE FOR EACH EPISODE (Patient not taking: Reported on 02/25/2024), Disp: 180 tablet, Rfl: 1   nitroGLYCERIN  (NITROSTAT ) 0.4 MG SL tablet, DISSOLVE 1 TABLET UNDER THE TONGUE EVERY 5 MINUTES AS NEEDED FOR CHEST PAIN (Patient not taking: Reported on 02/25/2024), Disp: 25 tablet, Rfl: 6  Allergies:  Allergies  Allergen Reactions   Codeine Phosphate Anaphylaxis, Shortness Of Breath and Swelling   Contrast Media [Iodinated Contrast Media] Swelling    Swelling of the face. Reaction was to ionic contrast many years ago. Patient has had non-ionic contrast many time without pre medication and has had no reaction.    Statins Other (See Comments)    Hyperactivity   Tramadol Palpitations and Other (See Comments)    Hyperactivity    Past Medical History, Surgical history, Social history, and Family History were reviewed and updated.  Review of Systems: Review of Systems  Constitutional: Negative.   HENT:  Negative.    Eyes: Negative.   Respiratory: Negative.    Cardiovascular: Negative.   Gastrointestinal: Negative.   Endocrine: Negative.   Genitourinary: Negative.    Musculoskeletal:  Positive for arthralgias.  Neurological: Negative.   Hematological: Negative.   Psychiatric/Behavioral: Negative.      Physical Exam:  height is 5' 8 (1.727 m) and weight is 204 lb 6.4 oz (92.7 kg). Her oral temperature is 98.9 F (37.2 C). Her blood pressure is 128/48 (abnormal) and her pulse is 76. Her respiration is 20 and oxygen saturation is 96%.   Wt  Readings from Last 3 Encounters:  02/25/24 204 lb 6.4 oz (92.7 kg)  02/23/24 196 lb (88.9 kg)  02/19/24 206 lb 2 oz (93.5 kg)    Physical Exam Vitals reviewed.  HENT:     Head: Normocephalic and atraumatic.   Eyes:     Pupils: Pupils are equal, round, and reactive to light.    Cardiovascular:     Rate and Rhythm: Normal rate and regular rhythm.     Heart sounds: Normal heart sounds.  Pulmonary:     Effort: Pulmonary effort is normal.     Breath sounds: Normal breath sounds.  Abdominal:     General: Bowel sounds are normal.  Palpations: Abdomen is soft.   Musculoskeletal:        General: No tenderness or deformity. Normal range of motion.     Cervical back: Normal range of motion.     Comments: Behind the left knee is a surgical scar.  This is well-healed.  She has significant radiation dermatitis.  There is couple areas of open sores.  There is no obvious discharge.  She has a little bit of swelling.    Lymphadenopathy:     Cervical: No cervical adenopathy.   Skin:    General: Skin is warm and dry.     Findings: No erythema or rash.   Neurological:     Mental Status: She is alert and oriented to person, place, and time.   Psychiatric:        Behavior: Behavior normal.        Thought Content: Thought content normal.        Judgment: Judgment normal.      Lab Results  Component Value Date   WBC 7.2 12/31/2023   HGB 11.5 (L) 12/31/2023   HCT 34.5 (L) 12/31/2023   MCV 91.0 12/31/2023   PLT 247 12/31/2023     Chemistry      Component Value Date/Time   NA 138 02/19/2024 1532   NA 136 04/29/2022 1230   K 4.3 02/19/2024 1532   CL 100 02/19/2024 1532   CO2 29 02/19/2024 1532   BUN 46 (H) 02/19/2024 1532   BUN 30 (H) 04/29/2022 1230   CREATININE 1.13 02/19/2024 1532   CREATININE 1.38 (H) 12/31/2023 1205   CREATININE 0.90 10/24/2016 1127      Component Value Date/Time   CALCIUM 9.4 02/19/2024 1532   ALKPHOS 63 02/19/2024 1532   AST 10 02/19/2024 1532    AST 9 (L) 12/31/2023 1205   ALT 7 02/19/2024 1532   ALT 8 12/31/2023 1205   BILITOT 0.4 02/19/2024 1532   BILITOT 0.5 12/31/2023 1205       Impression and Plan: [name] is a very charming 83 year old white female.  She has a sarcoma behind the left knee in the popliteal fossa.  This is a fairly high-grade sarcoma.  As such, she had this resected.  Her margins were positive.  We have given her radiotherapy.  She completed 6840 rad of radiotherapy on 04/30/2023.  Again, maybe a PET scan will help us  out.  She really does not want to have surgery.  I told her that it is possible and probable that she may need to have surgery to figure out what might be going on.  I would like to see her back in about 6 weeks now.  I think if the PET scan looks fine, the maybe we can move her appointments out for a little bit longer.    Maude JONELLE Crease, MD 6/25/202512:53 PM

## 2024-03-10 ENCOUNTER — Encounter (HOSPITAL_COMMUNITY)
Admission: RE | Admit: 2024-03-10 | Discharge: 2024-03-10 | Disposition: A | Discharge status: Home / Self Care | Source: Ambulatory Visit | Attending: Hematology & Oncology | Admitting: Hematology & Oncology

## 2024-03-10 DIAGNOSIS — K439 Ventral hernia without obstruction or gangrene: Secondary | ICD-10-CM | POA: Insufficient documentation

## 2024-03-10 DIAGNOSIS — C4922 Malignant neoplasm of connective and soft tissue of left lower limb, including hip: Secondary | ICD-10-CM | POA: Diagnosis not present

## 2024-03-10 DIAGNOSIS — N200 Calculus of kidney: Secondary | ICD-10-CM | POA: Insufficient documentation

## 2024-03-10 DIAGNOSIS — C499 Malignant neoplasm of connective and soft tissue, unspecified: Secondary | ICD-10-CM | POA: Diagnosis present

## 2024-03-10 DIAGNOSIS — I700 Atherosclerosis of aorta: Secondary | ICD-10-CM | POA: Diagnosis not present

## 2024-03-10 LAB — GLUCOSE, CAPILLARY: Glucose-Capillary: 90 mg/dL (ref 70–99)

## 2024-03-10 MED ORDER — FLUDEOXYGLUCOSE F - 18 (FDG) INJECTION
10.6000 | Freq: Once | INTRAVENOUS | Status: AC
  Administered 2024-03-10: 10.909 via INTRAVENOUS

## 2024-03-10 NOTE — Progress Notes (Signed)
 Radiation Oncology         [phone] ________________________________  Name: [name] MRN: [id number]  Date: 03/11/2024  DOB: 01-Apr-1941  Follow-Up Visit Note  CC: Katrinka Garnette KIDD, MD  Katrinka Garnette KIDD, MD    ICD-10-CM   1. Primary myxofibrosarcoma (HCC)  C49.9        Diagnosis: Stage IIIB (T2N0M0G3) myxofibrosarcoma of the left popliteal fossa: s/p resection with positive margins and adjuvant radiation completed on 04/30/2023   Interval Since Last Radiation: 10 months and 13 days    Indication for treatment: Curative       Radiation treatment dates: 03/05/23 through 04/30/23  Site/dose: Left popliteal fossa/surgical bed- 68.4 Gy delivered in 38 Fx at 1.8 Gy/Fx Technique/Mode: 3D / Photon  Beams/energy: 6X   Narrative:  The patient returns today for routine follow-up. She was last seen in office on 12/15/23  for a routine follow up visit. Patient continued to follow up with their specialists to manage their chronic conditions.   In the interval since she was last seen, she presented for a follow up with Dr. Timmy on 12/31/23 where she reported doing well overall with no new complaints. MRI showed no evidence of obvious suspicious recurrent tumor. She returned for a follow up on 02/25/24 where she complained of new pain. Dr. Timmy recommended PET imaging for further evaluation. Results from PET on 03/10/2024 showed no evidence of recurrent disease or metastatic disease.  Low-level activity was noted in the posterior left knee was most consistent with postoperative scarring.           Patient notes continued pain in her left popliteal fossa, at the surgical incision. This has been present since completing her radiation treatment. She takes Tylenol  daily and occasionally Norco to control this pain. She uses a cane to help with ambulation of long distances. She denies any chest pain or worsening shortness of breath today.                   Allergies:  is allergic to codeine  phosphate, contrast media [iodinated contrast media], statins, and tramadol.  Meds: Current Outpatient Medications  Medication Sig Dispense Refill   alendronate  (FOSAMAX ) 70 MG tablet TAKE 1 TABLET (70 MG TOTAL) BY MOUTH EVERY 7 DAYS WITH FULL GLASS WATER ON EMPTY STOMACH 12 tablet 3   allopurinol  (ZYLOPRIM ) 100 MG tablet TAKE 1 TABLET BY MOUTH EVERY DAY 90 tablet 2   apixaban  (ELIQUIS ) 5 MG TABS tablet Take 1 tablet (5 mg total) by mouth 2 (two) times daily. 180 tablet 3   Artificial Tear Solution (TEARS RENEWED OP) Apply 1 drop to eye daily as needed (dry eyes).     bismuth subsalicylate (PEPTO BISMOL) 262 MG/15ML suspension Take 30 mLs by mouth every 6 (six) hours as needed for diarrhea or loose stools.     carvedilol  (COREG ) 12.5 MG tablet Take 1 tablet (12.5 mg total) by mouth 2 (two) times daily. May take an additional 12.5 mg if needed for extra palp. 180 tablet 3   colchicine 0.6 MG tablet Take by mouth.     cyanocobalamin  (VITAMIN B12) 1000 MCG tablet Take 1,000 mcg by mouth daily.     diclofenac sodium (VOLTAREN) 1 % GEL Apply 2 g topically 4 (four) times daily as needed (pain).     furosemide  (LASIX ) 80 MG tablet TAKE 1 TABLET(80 MG) BY MOUTH ONCE A DAY , MAY TAKE AN ADDITIONAL 1 TABLET IF NEEDED FOR SWELLING 180 tablet 3  HYDROcodone -acetaminophen  (NORCO/VICODIN) 5-325 MG tablet Take 1 tablet by mouth every 6 (six) hours as needed for moderate pain (pain score 4-6). 30 tablet 0   hyoscyamine  (ANASPAZ ) 0.125 MG TBDP disintergrating tablet Place 1 tablet (0.125 mg total) under the tongue every 6 (six) hours as needed (abdominal cramping). 30 tablet 5   indapamide  (LOZOL ) 2.5 MG tablet Take 2.5 mg by mouth daily.     isosorbide  mononitrate (IMDUR ) 60 MG 24 hr tablet Take 1 tablet (60 mg total) by mouth daily. May take an extra dose if you use Nitroglycerin   sublingual tablet for 2 days afterwards 100 tablet 3   lidocaine  (XYLOCAINE ) 5 % ointment Apply 1 application. topically as needed.  35.44 g 0   loperamide (IMODIUM A-D) 2 MG tablet Take 2 mg by mouth 4 (four) times daily as needed for diarrhea or loose stools.     losartan  (COZAAR ) 50 MG tablet TAKE 1 TABLET BY MOUTH EVERY DAY 90 tablet 3   meclizine  (ANTIVERT ) 12.5 MG tablet Take 1 tablet (12.5 mg total) by mouth 2 (two) times daily as needed for dizziness. 60 tablet 2   nystatin  (MYCOSTATIN /NYSTOP ) powder Apply 1 Application topically 3 (three) times daily. 30 g 2   solifenacin  (VESICARE ) 10 MG tablet Take 1 tablet (10 mg total) by mouth daily. 30 tablet 11   spironolactone  (ALDACTONE ) 25 MG tablet TAKE 1 TABLET BY MOUTH AS DIRECTED. TAKE 1 TABLET ON MONDAYS, WEDNESDAYS, AND FRIDAYS AT 6 PM. 45 tablet 1   amiodarone  (PACERONE ) 200 MG tablet TAKE 400 MG (2 TABLETS) ONE DOSE AS NEED FOR A FAST HEART RATE ,IF AFTER 12 HOURS STILL PRESENT TAKE 200 MG ( 1 TABLET) ONE DOSE FOR EACH EPISODE (Patient not taking: Reported on 02/25/2024) 180 tablet 1   nitroGLYCERIN  (NITROSTAT ) 0.4 MG SL tablet DISSOLVE 1 TABLET UNDER THE TONGUE EVERY 5 MINUTES AS NEEDED FOR CHEST PAIN (Patient not taking: Reported on 03/11/2024) 25 tablet 6   No current facility-administered medications for this encounter.    Physical Findings: The patient is in no acute distress. Patient is alert and oriented.  height is 5' 8 (1.727 m) and weight is 203 lb 4 oz (92.2 kg). Her temporal temperature is 97.5 F (36.4 C) (abnormal). Her blood pressure is 176/55 (abnormal) and her pulse is 64. Her respiration is 18 and oxygen saturation is 96%. .  No significant changes. Lungs are clear to auscultation bilaterally. Heart has regular rate and rhythm. No palpable cervical adenopathy. Abdomen soft, non-tender, normal bowel sounds. Well-healed surgical incision in the left popliteal fossa. Indurated tissue palpated beneath the incision. Tenderness to palpation along the incision. No signs of infection or delayed wound healing. . Bilateral lower leg edema.    Lab Findings: Lab  Results  Component Value Date   WBC 7.1 02/25/2024   HGB 11.2 (L) 02/25/2024   HCT 33.9 (L) 02/25/2024   MCV 91.4 02/25/2024   PLT 257 02/25/2024    Radiographic Findings: NM PET CT CARDIAC PERFUSION MULTI W/ABSOLUTE BLOODFLOW Result Date: 03/23/2024   Findings are consistent with ischemia. The study is intermediate risk not due to the small areas of epicardial perfusion defect but because of the decrease in blood flow reserve (that can also be seen in microvascular disease).   LV perfusion is abnormal. There is evidence of ischemia. Defect 1: There is a small defect with moderate reduction in uptake present in the basal septal and apex location(s) that is reversible. There is normal wall motion in the  defect area. Consistent with ischemia.   Rest left ventricular function is normal. Rest EF: 60%. Stress left ventricular function is normal. Stress EF: 69%. End diastolic cavity size is normal. End systolic cavity size is normal.   Myocardial blood flow was computed to be 1.65ml/g/min at rest and 1.75ml/g/min at stress. Global myocardial blood flow reserve was 1.64 and was abnormal.   Coronary calcium assessment not performed due to prior revascularization.  Aortic atherosclerosis noted.  Two Device leads are incompletely imaged to terimination into the right atrial appendage and the right ventricular apex.   Electronically Signed  By: Stanly Leavens M.D. EXAM: OVER-READ INTERPRETATION CARDIAC CT CHEST The following report is an over-read performed by radiologist Dr. Ryan Salvage of [facility] on 03/23/2024. This over-read does not include interpretation of cardiac or coronary anatomy or pathology. The cardiac and PET interpretation by the cardiologist is attached or will be attached. COMPARISON:  PET-CT 03/10/2024 FINDINGS: Extracardiac Vascular: Dense atheromatous vascular calcification of the thoracic aorta. Mediastinum: Small type 1 hiatal hernia. Lung: Mild dependent atelectasis  in both lower lobes. Included Upper Abdomen: Pneumobilia. Left adrenal adenoma measuring 1.4 by 1.9 cm on image 77 series 3. No further imaging workup of this lesion is indicated. Musculoskeletal: Lower thoracic posterolateral rod and pedicle screw fixation. IMPRESSION: 1. Small type 1 hiatal hernia. 2. Mild dependent atelectasis in both lower lobes. 3. Left adrenal adenoma. No further imaging workup of this lesion is indicated. 4. Pneumobilia. 5. Lower thoracic posterolateral rod and pedicle screw fixation. 6.  Aortic Atherosclerosis (ICD10-I70.0). Electronically Signed   By: Ryan Salvage M.D.   On: 03/23/2024 11:20  NM PET Image Initial (PI) Whole Body Result Date: 03/11/2024 CLINICAL DATA:  Subsequent treatment strategy for primary mix of fibrosarcoma posterior to the left knee. EXAM: NUCLEAR MEDICINE PET WHOLE BODY TECHNIQUE: 10.9 mCi F-18 FDG was injected intravenously. Full-ring PET imaging was performed from the head to foot after the radiotracer. CT data was obtained and used for attenuation correction and anatomic localization. Fasting blood glucose: 90 mg/dl COMPARISON:  Multiple exams, including CT scan 10/30/2023 FINDINGS: Mediastinal blood pool activity: SUV max 2.7 HEAD/NECK: Symmetric palatine and lingual tonsillar activity, likely incidental. Along the right posterior margin of the thyroid  cartilage the level of the glottis, and near the margin of the inferior constrictor just above the level of the thyroid  gland, there is a focus of accentuated activity on the right side with maximum SUV 6.2 but no associated CT abnormality. This would represent a very unusual solitary metastatic pathway for sarcoma and is probably physiologic muscular activity for example along the posterior cricoarytenoid muscle or inferior constrictor. Incidental CT findings: Mild nodularity in the thyroid  gland has been investigated on prior imaging of 08/14/2023. Bilateral common carotid atheromatous vascular  calcification. CHEST: No significant abnormal hypermetabolic activity in this region. Incidental CT findings: Coronary, aortic arch, and branch vessel atherosclerotic vascular disease. Pacer device noted. Dependent atelectasis or scarring in the lower lobes, right greater than left. ABDOMEN/PELVIS: Physiologic activity in bowel. This includes segmental activity is well as some multifocal activity along the sigmoid colon, a lymph node or inflamed diverticulum along the distal sigmoid colon on image 177 series 4 has a maximum SUV of 9.3. Incidental CT findings: 1.1 by 1.8 cm left adrenal mass with precontrast density of 29 Hounsfield units has maximum SUV of 2.4, favoring benign etiology. This has been characterized as definitively a benign adrenal adenoma on prior imaging. No further imaging workup of this lesion is  indicated. Atherosclerosis is present, including aortoiliac atherosclerotic disease. Substantial atheromatous plaque in the celiac trunk and SMA, possible celiac artery stent. Left renal artery stent. Suspected nonobstructive right nephrolithiasis. Small ventral midline hernia just above the pubis contains a small margin of the anterior urinary bladder. SKELETON: No significant abnormal hypermetabolic activity in this region. Incidental CT findings: Chronic avascular necrosis or chronic osteochondral lesion of the right humeral head. Postoperative findings in the lumbar spine and bony pelvis. Left total knee prosthesis. EXTREMITIES: Due to streak artifact from the total hip prosthesis, I have also carefully scrutinized the non attenuation corrected images in the vicinity of the knee. Low-grade activity posterior to the left knee is most in keeping with postoperative scarring. Maximum regional SUV 3.0. No recurrent mass or focal hypermetabolic activity along the scarring is identified to indicate recurrence. Incidental CT findings: Muscular atrophy in both lower extremities. Subcutaneous edema in both  distal calves and also tracking along the plantar heels. IMPRESSION: 1. No findings of recurrent malignancy. 2. Low-grade activity posterior to the left knee is most in keeping with postoperative scarring. 3. Focal activity along the right posterior margin of the thyroid  cartilage at the level of the glottis, and near the margin of the inferior constrictor just above the level of the thyroid  gland, without associated CT abnormality. This probably physiologic muscular activity for example along the posterior cricoarytenoid muscle or inferior constrictor. If further PET surveillance imaging is performed, attention to this location would be suggested. 4. Small region of chronic avascular necrosis or chronic osteochondral lesion of the right humeral head. 5. Small ventral midline hernia just above the pubis contains a small margin of the anterior urinary bladder. 6. Suspected nonobstructive right nephrolithiasis. 7.  Aortic Atherosclerosis (ICD10-I70.0). Electronically Signed   By: Ryan Salvage M.D.   On: 03/11/2024 15:37    Impression/Plan:    Stage IIIB (T2N0M0G3) myxofibrosarcoma of the left popliteal fossa: s/p resection with positive margins and adjuvant radiation completed on 04/30/2023  Patient notes pain in her left popliteal fossa since her radiation treatment. Recent imaging demonstrates no evidence of disease recurrence. It is possible her pain is a result of scar tissue formation. Patient declined PT referral today. She has not seen her surgeon, Dr. Glendia Blush, since the surgery. She would like to be evaluated to rule out surgery complications as a cause for her pain. Referral made to Dr. Blush for re-evaluation.   She is scheduled to follow up with medical oncology on 04/15/2024. Given her history of high dose of radiation, we will continue to follow her as well.   RTC in 3 months. She was encouraged to call with any questions or concerns in the meantime.   Hypertension  Patient's BP  is elevated today at 175/55 mm Hg. She is currently asymptomatic. She does not take her blood pressure regularly at home. Per Dr. Genice recommendations, If blood pressure remains above 160/100 mmHg (after 1 hour), take an extra dose of losartan . Encouraged patient to also check her BP daily at the same time. If BP is consistently elevated, patient should inform her cardiologist. She expressed understanding.    20 minutes of total time was spent for this patient encounter, including preparation, face-to-face counseling with the patient and coordination of care, physical exam, and documentation of the encounter. ____________________________________   Leeroy Due, PA-C   Lynwood CHARM Nasuti, PhD, MD   [facility]  Radiation Oncology Direct Dial: [phone]  Fax: [phone] [email or web address]    This document serves as  a record of services personally performed by Lynwood Nasuti, MD. It was created on his behalf by Reymundo Cartwright, a trained medical scribe. The creation of this record is based on the scribe's personal observations and the provider's statements to them. This document has been checked and approved by the attending provider.

## 2024-03-11 ENCOUNTER — Ambulatory Visit: Payer: Self-pay | Admitting: Hematology & Oncology

## 2024-03-11 ENCOUNTER — Ambulatory Visit
Admission: RE | Admit: 2024-03-11 | Discharge: 2024-03-11 | Disposition: A | Discharge status: Home / Self Care | Source: Ambulatory Visit | Attending: Radiation Oncology | Admitting: Radiation Oncology

## 2024-03-11 VITALS — BP 176/55 | HR 64 | Temp 97.5°F | Resp 18 | Ht 68.0 in | Wt 203.2 lb

## 2024-03-11 DIAGNOSIS — R609 Edema, unspecified: Secondary | ICD-10-CM | POA: Insufficient documentation

## 2024-03-11 DIAGNOSIS — I2510 Atherosclerotic heart disease of native coronary artery without angina pectoris: Secondary | ICD-10-CM | POA: Insufficient documentation

## 2024-03-11 DIAGNOSIS — Z7901 Long term (current) use of anticoagulants: Secondary | ICD-10-CM | POA: Insufficient documentation

## 2024-03-11 DIAGNOSIS — K5730 Diverticulosis of large intestine without perforation or abscess without bleeding: Secondary | ICD-10-CM | POA: Insufficient documentation

## 2024-03-11 DIAGNOSIS — I10 Essential (primary) hypertension: Secondary | ICD-10-CM | POA: Insufficient documentation

## 2024-03-11 DIAGNOSIS — M6250 Muscle wasting and atrophy, not elsewhere classified, unspecified site: Secondary | ICD-10-CM | POA: Diagnosis not present

## 2024-03-11 DIAGNOSIS — C4922 Malignant neoplasm of connective and soft tissue of left lower limb, including hip: Secondary | ICD-10-CM | POA: Diagnosis not present

## 2024-03-11 DIAGNOSIS — Z79899 Other long term (current) drug therapy: Secondary | ICD-10-CM | POA: Diagnosis not present

## 2024-03-11 DIAGNOSIS — Z923 Personal history of irradiation: Secondary | ICD-10-CM | POA: Insufficient documentation

## 2024-03-11 DIAGNOSIS — C499 Malignant neoplasm of connective and soft tissue, unspecified: Secondary | ICD-10-CM

## 2024-03-11 DIAGNOSIS — I708 Atherosclerosis of other arteries: Secondary | ICD-10-CM | POA: Insufficient documentation

## 2024-03-11 DIAGNOSIS — I700 Atherosclerosis of aorta: Secondary | ICD-10-CM | POA: Insufficient documentation

## 2024-03-11 NOTE — Progress Notes (Addendum)
     [name] is here today for follow up post radiation to left knee and to receive results from most recent PET scan. She states she has healed well from the effects of radiation.     She completed their radiation on: 04/30/2023   Does the patient complain of any of the following:  Post radiation skin issues: Denies Joint Pain/ Swelling: Reports swelling to bilateral feet and knees, but reports it's more prominent to left. Also reports ongoing pain to back of left knee, usually about a 5/6 out of 10. Manages with OTC acetaminophen  and ibuprofen. States pain does not interfere with ambulation.she feels that there is still something going on behind the right knee.  Range of Motion limitations:She states her shoulders gives her problems when she reaches for items out of the cabinet. Fatigue post radiation: Denies  BP (!) 176/55 (BP Location: Left Arm, Patient Position: Sitting)   Pulse 64   Temp (!) 97.5 F (36.4 C) (Temporal)   Resp 18   Ht 5' 8 (1.727 m)   Wt 203 lb 4 oz (92.2 kg)   SpO2 96%   BMI 30.90 kg/m     Wt Readings from Last 3 Encounters: 03/11/24         203 lb  02/25/24 204 lb 6.4 oz (92.7 kg)  02/23/24 196 lb (88.9 kg)

## 2024-03-12 ENCOUNTER — Telehealth: Payer: Self-pay | Admitting: Radiation Oncology

## 2024-03-12 ENCOUNTER — Other Ambulatory Visit: Payer: Self-pay | Admitting: Radiology

## 2024-03-12 DIAGNOSIS — C499 Malignant neoplasm of connective and soft tissue, unspecified: Secondary | ICD-10-CM

## 2024-03-12 NOTE — Telephone Encounter (Signed)
 7/11 Outgoing Referral Faxed to Dr. Tanda - [facility].

## 2024-03-15 ENCOUNTER — Telehealth: Payer: Self-pay | Admitting: Radiation Oncology

## 2024-03-15 NOTE — Telephone Encounter (Signed)
 7/14 Follow up call/left voicemail for Dr. Luigi Ref Coordinator/Medical Records Dept. For outgoing ref, if patient has already been sch.

## 2024-03-16 ENCOUNTER — Telehealth: Payer: Self-pay | Admitting: Radiation Oncology

## 2024-03-16 DIAGNOSIS — M79675 Pain in left toe(s): Secondary | ICD-10-CM | POA: Diagnosis not present

## 2024-03-16 DIAGNOSIS — M79674 Pain in right toe(s): Secondary | ICD-10-CM | POA: Diagnosis not present

## 2024-03-16 DIAGNOSIS — E119 Type 2 diabetes mellitus without complications: Secondary | ICD-10-CM | POA: Diagnosis not present

## 2024-03-16 DIAGNOSIS — B351 Tinea unguium: Secondary | ICD-10-CM | POA: Diagnosis not present

## 2024-03-16 NOTE — Telephone Encounter (Signed)
 7/15 Follow up call to Dr. Luigi office, spoke to [place], ref/md notes received and currently in review.  Will call back once patient is schedule.

## 2024-03-16 NOTE — Telephone Encounter (Signed)
 7/15 Received voicemail from Danielle at Dr. Luigi office, patient is now sch for 8/5 @ 1:15 pm.  Christinia sent to Le[facility] armin [facility], so they are aware.

## 2024-03-23 ENCOUNTER — Ambulatory Visit

## 2024-03-23 ENCOUNTER — Encounter (HOSPITAL_COMMUNITY)
Admission: RE | Admit: 2024-03-23 | Discharge: 2024-03-23 | Disposition: A | Discharge status: Home / Self Care | Source: Ambulatory Visit | Attending: Cardiology | Admitting: Cardiology

## 2024-03-23 DIAGNOSIS — I25119 Atherosclerotic heart disease of native coronary artery with unspecified angina pectoris: Secondary | ICD-10-CM | POA: Diagnosis not present

## 2024-03-23 DIAGNOSIS — I5030 Unspecified diastolic (congestive) heart failure: Secondary | ICD-10-CM | POA: Diagnosis not present

## 2024-03-23 DIAGNOSIS — M791 Myalgia, unspecified site: Secondary | ICD-10-CM | POA: Diagnosis not present

## 2024-03-23 DIAGNOSIS — I441 Atrioventricular block, second degree: Secondary | ICD-10-CM | POA: Diagnosis not present

## 2024-03-23 DIAGNOSIS — T466X5A Adverse effect of antihyperlipidemic and antiarteriosclerotic drugs, initial encounter: Secondary | ICD-10-CM | POA: Insufficient documentation

## 2024-03-23 DIAGNOSIS — E785 Hyperlipidemia, unspecified: Secondary | ICD-10-CM | POA: Diagnosis not present

## 2024-03-23 DIAGNOSIS — E1169 Type 2 diabetes mellitus with other specified complication: Secondary | ICD-10-CM | POA: Insufficient documentation

## 2024-03-23 DIAGNOSIS — Z9861 Coronary angioplasty status: Secondary | ICD-10-CM | POA: Insufficient documentation

## 2024-03-23 DIAGNOSIS — I480 Paroxysmal atrial fibrillation: Secondary | ICD-10-CM | POA: Diagnosis not present

## 2024-03-23 DIAGNOSIS — I2510 Atherosclerotic heart disease of native coronary artery without angina pectoris: Secondary | ICD-10-CM | POA: Diagnosis not present

## 2024-03-23 LAB — NM PET CT CARDIAC PERFUSION MULTI W/ABSOLUTE BLOODFLOW
MBFR: 1.64
Nuc Rest EF: 60 %
Nuc Stress EF: 69 %
Rest MBF: 1.07 ml/g/min
Rest Nuclear Isotope Dose: 28.9 mCi
ST Depression (mm): 0 mm
Stress MBF: 1.75 ml/g/min
Stress Nuclear Isotope Dose: 23.9 mCi
TID: 1.1

## 2024-03-23 MED ORDER — RUBIDIUM RB82 GENERATOR (RUBYFILL)
23.9200 | PACK | Freq: Once | INTRAVENOUS | Status: AC
  Administered 2024-03-23: 23.92 via INTRAVENOUS

## 2024-03-23 MED ORDER — REGADENOSON 0.4 MG/5ML IV SOLN
INTRAVENOUS | Status: AC
  Filled 2024-03-23: qty 5

## 2024-03-23 MED ORDER — REGADENOSON 0.4 MG/5ML IV SOLN
0.4000 mg | Freq: Once | INTRAVENOUS | Status: AC
  Administered 2024-03-23: 0.4 mg via INTRAVENOUS

## 2024-03-23 MED ORDER — RUBIDIUM RB82 GENERATOR (RUBYFILL)
28.8700 | PACK | Freq: Once | INTRAVENOUS | Status: AC
  Administered 2024-03-23: 28.87 via INTRAVENOUS

## 2024-03-23 NOTE — Progress Notes (Signed)
 Pt. Tolerated lexi scan well.

## 2024-03-24 ENCOUNTER — Ambulatory Visit: Payer: Self-pay | Admitting: Cardiology

## 2024-03-30 ENCOUNTER — Inpatient Hospital Stay (HOSPITAL_COMMUNITY): Admission: RE | Admit: 2024-03-30 | Source: Ambulatory Visit

## 2024-04-02 ENCOUNTER — Encounter: Payer: Self-pay | Admitting: Cardiology

## 2024-04-02 ENCOUNTER — Ambulatory Visit: Attending: Cardiology | Admitting: Cardiology

## 2024-04-02 VITALS — BP 125/79 | HR 82 | Ht 68.0 in | Wt 195.6 lb

## 2024-04-02 DIAGNOSIS — Z9861 Coronary angioplasty status: Secondary | ICD-10-CM | POA: Diagnosis present

## 2024-04-02 DIAGNOSIS — I2089 Other forms of angina pectoris: Secondary | ICD-10-CM | POA: Diagnosis not present

## 2024-04-02 DIAGNOSIS — E785 Hyperlipidemia, unspecified: Secondary | ICD-10-CM | POA: Diagnosis not present

## 2024-04-02 DIAGNOSIS — I5030 Unspecified diastolic (congestive) heart failure: Secondary | ICD-10-CM | POA: Diagnosis not present

## 2024-04-02 DIAGNOSIS — I87302 Chronic venous hypertension (idiopathic) without complications of left lower extremity: Secondary | ICD-10-CM | POA: Diagnosis present

## 2024-04-02 DIAGNOSIS — R9439 Abnormal result of other cardiovascular function study: Secondary | ICD-10-CM | POA: Diagnosis not present

## 2024-04-02 DIAGNOSIS — I2510 Atherosclerotic heart disease of native coronary artery without angina pectoris: Secondary | ICD-10-CM | POA: Insufficient documentation

## 2024-04-02 DIAGNOSIS — R9430 Abnormal result of cardiovascular function study, unspecified: Secondary | ICD-10-CM | POA: Insufficient documentation

## 2024-04-02 DIAGNOSIS — R0989 Other specified symptoms and signs involving the circulatory and respiratory systems: Secondary | ICD-10-CM | POA: Diagnosis not present

## 2024-04-02 DIAGNOSIS — I480 Paroxysmal atrial fibrillation: Secondary | ICD-10-CM | POA: Insufficient documentation

## 2024-04-02 DIAGNOSIS — R0609 Other forms of dyspnea: Secondary | ICD-10-CM | POA: Diagnosis present

## 2024-04-02 DIAGNOSIS — E1169 Type 2 diabetes mellitus with other specified complication: Secondary | ICD-10-CM | POA: Insufficient documentation

## 2024-04-02 DIAGNOSIS — I4719 Other supraventricular tachycardia: Secondary | ICD-10-CM | POA: Insufficient documentation

## 2024-04-02 DIAGNOSIS — D6869 Other thrombophilia: Secondary | ICD-10-CM | POA: Diagnosis not present

## 2024-04-02 DIAGNOSIS — I25119 Atherosclerotic heart disease of native coronary artery with unspecified angina pectoris: Secondary | ICD-10-CM | POA: Insufficient documentation

## 2024-04-02 MED ORDER — PREDNISONE 50 MG PO TABS: ORAL_TABLET | ORAL | 0 refills | Status: DC

## 2024-04-02 NOTE — H&P (View-Only) (Signed)
 Cardiology Office Note:  .   Date:  04/02/2024  ID:  [name], DOB 12-31-1940, MRN [id number] PCP/Referring Provider: Katrinka Garnette KIDD, MD  [facility] Providers Cardiologist:  Tricia Clay, MD Electrophysiologist:  Tricia Balding, MD     No chief complaint on file.   Patient Profile: .     [name] is Tricia very pleasant 83 y.o. female  with Tricia PMH reviewed below who presents here for routine follow-up to discuss results of her Cardiac Stress PET ordered to evaluate symptoms concerning for atypical angina and exertional dyspnea.  At the request of Tricia Garnette KIDD, MD.  CV History  CAD - 07/2010 - Progressive Angina: BMS PCI RCA & LAD (pre-op Back Sgx)  03/2011 Unstable Angina => LAD proximal ISR -> Overlapping DES w/ PTCA of jailed D2 02/2012: Patent Stents -> 08/2012 (peri-Op MI) patent stents Most recent cath was July 2016: Widely patent LAD stents with D2 jailed.  Minimal D2 stenosis.  Proximal to mid RCA BMS with less than 10% ISR.  OM1 and D1 60% stenosis.-Stable.  Myoview  May 2021: No ischemia or infarct.  LOW RISK => has chronic intermittent severe chest pain which she calls angina Chronic HFpEF Chronic bilateral LE edema Celiac Artery Stenosis - s/p Stent (2018) Bilateral Renal Atery Stenosis -> s/p L RA Stent (2011) Paroxysmal atrial tachycardia -> last anywhere from 30 seconds to 10 minutes. Symptomatic bradycardia with sinus pauses, Mobitz 2 AVB => s/p dual-chamber PPM Paroxysmal atrial fibrillation -> has short bursts lasting just Tricia few hours. HLD-statin myopathy.  Unwilling to try any cholesterol medication    ALVIA Herring was last seen on February 23, 2024 for Tricia routine follow-up noting increased episodes of exertional dyspnea now with minimal exertion associated with chest discomfort and palpitations.  Episodes typically occur with activity and resolves with sitting down.  Sometimes palpitations are related to chest pain Vascepa from time to time they are  not happening at the same time.  She has not had an episode where she felt like she needed to take amiodarone  for breakthrough Tricia-fib, stating that these episodes are not lasting long enough.  In addition to having Tricia chest tightness or pressure that can happen with exertion she also notes that can sometimes happen when she is lying down at night to sleep.  Stable edema for which she takes furosemide .. We decided to order Tricia Cardiac Stress PET to assess her chest comfort Due to the possibility of potentially evaluating for microvascular disease with coronary blood flow reserve.  This was at the recommendations of Dr. Balding.  Subjective  [name] returns here today to discuss the results of her stress test that were abnormal albeit not high risk.  She says that she is doing okay is still able to get out and do what she wants to do but does have to stop because of the chest discomfort and shortness of breath.  No notable change compared to last visit she had tried to take nitroglycerin  Tricia couple times and the symptoms and did not necessarily note that it improved the symptoms but the nitroglycerin  prescription is old.  Cardiovascular ROS: positive for - chest pain, dyspnea on exertion, edema, and chest pain with both exertion but also at night with sleep.  Less prominent palpitations. negative for - orthopnea, paroxysmal nocturnal dyspnea, rapid heart rate, shortness of breath, or syncope or near syncope, TIA or amaurosis fugax, claudication    Objective   Current Meds  Medication Sig   alendronate  (FOSAMAX ) 70 MG tablet TAKE 1 TABLET (70 MG TOTAL) BY MOUTH EVERY 7 DAYS WITH FULL GLASS WATER ON EMPTY STOMACH   allopurinol  (ZYLOPRIM ) 100 MG tablet TAKE 1 TABLET BY MOUTH EVERY DAY   amiodarone  (PACERONE ) 200 MG tablet TAKE 400 MG (2 TABLETS) ONE DOSE AS NEED FOR Tricia FAST HEART RATE ,IF AFTER 12 HOURS STILL PRESENT TAKE 200 MG ( 1 TABLET) ONE DOSE FOR EACH EPISODE   apixaban  (ELIQUIS ) 5 MG TABS  tablet Take 1 tablet (5 mg total) by mouth 2 (two) times daily.   Artificial Tear Solution (TEARS RENEWED OP) Apply 1 drop to eye daily as needed (dry eyes).   bismuth subsalicylate (PEPTO BISMOL) 262 MG/15ML suspension Take 30 mLs by mouth every 6 (six) hours as needed for diarrhea or loose stools.   carvedilol  (COREG ) 12.5 MG tablet Take 1 tablet (12.5 mg total) by mouth 2 (two) times daily. May take an additional 12.5 mg if needed for extra palp.   colchicine 0.6 MG tablet Take by mouth.   cyanocobalamin  (VITAMIN B12) 1000 MCG tablet Take 1,000 mcg by mouth daily.   diclofenac sodium (VOLTAREN) 1 % GEL Apply 2 g topically 4 (four) times daily as needed (pain).   furosemide  (LASIX ) 80 MG tablet TAKE 1 TABLET(80 MG) BY MOUTH ONCE Tricia DAY , MAY TAKE AN ADDITIONAL 1 TABLET IF NEEDED FOR SWELLING   HYDROcodone -acetaminophen  (NORCO/VICODIN) 5-325 MG tablet Take 1 tablet by mouth every 6 (six) hours as needed for moderate pain (pain score 4-6).   hyoscyamine  (ANASPAZ ) 0.125 MG TBDP disintergrating tablet Place 1 tablet (0.125 mg total) under the tongue every 6 (six) hours as needed (abdominal cramping).   indapamide  (LOZOL ) 2.5 MG tablet Take 2.5 mg by mouth daily.   isosorbide  mononitrate (IMDUR ) 60 MG 24 hr tablet Take 1 tablet (60 mg total) by mouth daily. May take an extra dose if you use Nitroglycerin   sublingual tablet for 2 days afterwards   lidocaine  (XYLOCAINE ) 5 % ointment Apply 1 application. topically as needed.   loperamide (IMODIUM Tricia-D) 2 MG tablet Take 2 mg by mouth 4 (four) times daily as needed for diarrhea or loose stools.   losartan  (COZAAR ) 50 MG tablet TAKE 1 TABLET BY MOUTH EVERY DAY   meclizine  (ANTIVERT ) 12.5 MG tablet Take 1 tablet (12.5 mg total) by mouth 2 (two) times daily as needed for dizziness.   nitroGLYCERIN  (NITROSTAT ) 0.4 MG SL tablet DISSOLVE 1 TABLET UNDER THE TONGUE EVERY 5 MINUTES AS NEEDED FOR CHEST PAIN   nystatin  (MYCOSTATIN /NYSTOP ) powder Apply 1 Application  topically 3 (three) times daily.   predniSONE  (DELTASONE ) 50 MG tablet Please take Prednisone  50mg  by mouth  9:30 pm on Monday,3:30 am on Tuesday and prior to leaving home please take last dose of Prednisone  50mg  and Benadryl  50mg  by mouth at 8:30 am on Tuesday   solifenacin  (VESICARE ) 10 MG tablet Take 1 tablet (10 mg total) by mouth daily.   spironolactone  (ALDACTONE ) 25 MG tablet TAKE 1 TABLET BY MOUTH AS DIRECTED. TAKE 1 TABLET ON MONDAYS, WEDNESDAYS, AND FRIDAYS AT 6 PM.   Studies Reviewed: .        Cardiac Stress PET: INTERMEDIATE RISK due to decrease in blood flow reserve-1 point cannot exclude microvascular disease)..  Abnormal perfusion with evidence of ischemia-small defect with moderate reductions in the basal septal and apex location there is reversible with normal motion.  Unusual vascular territory the findings would be consistent with ischemia.  Rest  EF 60%.  Stress EF 69%.  Aortic atherosclerosis noted.  2 device leads noted in the right atrial Penninger right ventricle apex.  Previous Pertinent Studies Lexiscan  Myoview  : Hyperdynamic LV EF 73%.  LOW RISK.  No RWMA.  (01/23/2017) Left Heart Cath and Coronary Angiography: 60% ostial D1 and OM1.  5 to 10% ISR in RCA and overlapping LAD stents with 30% proximal and 30 to 20% mid RCA on either side of the stent => added Imdur  for potential spasm (03/16/2015)  Risk Assessment/Calculations:    CHA2DS2-VASc Score = 5   This indicates Tricia 7.2% annual risk of stroke. The patient's score is based upon: CHF History: 0 HTN History: 1 Diabetes History: 0 Stroke History: 0 Vascular Disease History: 1 Age Score: 2 Gender Score: 1            Physical Exam:   VS:  BP 125/79 (BP Location: Left Arm, Patient Position: Sitting)   Pulse 82   Ht 5' 8 (1.727 m)   Wt 195 lb 9.6 oz (88.7 kg)   SpO2 97%   BMI 29.74 kg/m    Wt Readings from Last 3 Encounters:  04/02/24 195 lb 9.6 oz (88.7 kg)  03/11/24 203 lb 4 oz (92.2 kg)  02/25/24 204 lb  6.4 oz (92.7 kg)    GEN: Well nourished, well groomed in no acute distress; borderline obese but otherwise healthy-appearing. NECK: No JVD; No carotid bruits CARDIAC: Normal S1, S2; RRR, no murmurs, rubs, gallops RESPIRATORY:  Clear to auscultation; nonlabored, good air movement. ABDOMEN: Soft, non-tender, non-distended EXTREMITIES: Grossly 1+ BLE edema; No deformity; walks with cane     ASSESSMENT AND PLAN: .    Problem List Items Addressed This Visit       Cardiology Problems   Atypical angina (HCC) - Primary   Atypical anginal symptoms.  Some exertional some nonexertional.  Longstanding chest pain symptoms with recent ones are more associated with dyspnea. Evaluated with Tricia cardiac stress PET which was read as intermediate risk. Plan for cardiac catheterization for definitive assessment.      Relevant Orders   Basic metabolic panel with GFR   CBC   CAD S/P percutaneous coronary angioplasty: pRCA BMS, mLAD BMS overlapped prox with DES for ISR (Chronic)   Distant history of BMS in the RCA very as well as overlapping BMS/DES in the LAD dating back to 2011.  Noted to be patent on multiple different cardiac catheterizations as well as stress test.  She is not on aspirin  or Plavix  because of Eliquis . Current stress PET suggests small areas of reversible ischemia which could potentially correlate with septal perforators, however cannot exclude the possibility of LAD in-stent restenosis.  Discussed that the definitive evaluation will be cardiac catheterization with left heart catheterization and coronary angiography with possible PCI and if no culprit lesion noted we can do evaluation for coronary flow reserve.  Procedure scheduled for August 19. See consent below.      Congestive heart failure with LV diastolic dysfunction, NYHA class 2 (HCC) (Chronic)   Stable class II symptoms.  Although her exertional dyspnea may be little worse.  Evaluating for potential ischemic etiology given  abnormal findings on cardiac stress.  Will plan for cardiac catheterization.  She does not really noticed PND or orthopnea but does have exertional dyspnea and trivial edema. Continue carvedilol  12.5 mg twice daily along with losartan  50 mg daily Continue spironolactone  Monday Wednesday Friday with 80 mg furosemide  daily and additional doses per sliding scale.  Coronary artery disease involving native coronary artery of native heart with angina pectoris (HCC) (Chronic)   Known history of CAD with longstanding symptoms concerning for potential angina but we have never really failure fully determine.  Consideration has been either musculoskeletal versus microvascular disease related. For this reason we chose Tricia cardiac stress PET to evaluate for ischemia which did suggest potential reduced coronary blood flow, but since there was evidence of also reversible anterior defect in the setting of an rapping LAD stents, we will exclude ISR with invasive with coronary angiography.  Plan: Schedule Cardiac Catheterization with Coronary Angiography plus or Minus PCI and possible percutaneous evaluation of Coronary Flow Reserve if no culprit lesion noted. Again not on aspirin  because of Eliquis . Continue carvedilol  along with Imdur , but consider the possibility of converting to Ranexa where she to have no macrovascular disease. Continue afterload reduction with losartan  and intermittent spironolactone .      Hypercoagulable state due to paroxysmal atrial fibrillation (HCC) (Chronic)   On Eliquis  for CHA2DS2-VASc score 5.  Okay to hold for for procedure 2 to 3 days preop. Plan to hold 3 days preop for catheterization.      Hyperlipidemia associated with type 2 diabetes mellitus (HCC) (Chronic)   Per multiple discussions, she is not actually taking any cholesterol medications which as usual drives Tricia lot show I laughed from her husband.  She understands the risks but is adamant about not taking  anything.  As such, we are not following lipids. We did stress healthy diet.   Last A1c was 6.2, and currently not on medications.      Labile hypertension (Chronic)   Much better controlled as opposed to last visit.  Continue current regimen with losartan  and carvedilol  plus Imdur  and Lasix .      PAF (paroxysmal atrial fibrillation) (HCC) (Chronic)   Monitor with PPM assessment TAVR no prolonged episodes.  She has not had anything for which she has had to take amiodarone .  I suspect she may be more noting some PAT runs which are more frequent. -Continue carvedilol  12.5 mg twice daily for rate control and for symptomatic breakthrough of Tricia-fib she does appear in. -Continue Eliquis .      PAT (paroxysmal atrial tachycardia) (HCC) (Chronic)   Relatively well-controlled.  Continue carvedilol  12.5 mg twice daily.        Other   Abnormal pharmacologic myocardial perfusion study (Chronic)   Difficult to interpret the study.  I did discuss it with imaging cardiologist.  The intention was to assess for possibility of microvascular blood flow reserve which was suggested but there is also potential evidence of ischemia making the question of possible LAD stent restenosis or Denovo lesion.  As such, plans for cardiac catheterization with possible PCI. We have also planned to assess coronary flow reserve if no culprit lesion is noted.      Relevant Orders   Basic metabolic panel with GFR   CBC   Exertional dyspnea (Chronic)   Cardiac stress PET ordered to assess for possible ischemic either macrovascular or microvascular etiology.  Read as intermediate risk which therefore warrants further evaluation with cardiac catheterization.  EF preserved, and otherwise no PND orthopnea with trivial edema arguing against major role of HFpEF.      Stasis edema of bilateral lower extremity (Chronic)   Continue Lasix  with intermittent spironolactone . She does not have Zaroxolyn  anymore.            Informed Consent   Shared Decision Making/Informed Consent The risks [  stroke (1 in 1000), death (1 in 1000), kidney failure [usually temporary] (1 in 500), bleeding (1 in 200), allergic reaction [possibly serious] (1 in 200)], benefits (diagnostic support and management of coronary artery disease) and alternatives of Tricia cardiac catheterization were discussed in detail with Ms. Reh and she is willing to proceed.      Follow-Up: Return in about 6 weeks (around 05/11/2024).  I spent 45 minutes in the care of CENA BRUHN today including reviewing labs (1 minute), reviewing studies (6 minutes reviewing results of cardiac stress PET and discussing with imaging cardiologist), face to face time discussing treatment options (23 minutes), reviewing records from previous notes and cardiac cath/stress test results (2 minutes), 13 minutes dictating, and documenting in the encounter.     Signed, Tricia MICAEL Clay, MD, MS [name], M.D., M.S. Interventional Chartered certified accountant  Pager # [phone]

## 2024-04-02 NOTE — Assessment & Plan Note (Signed)
 Stable class II symptoms.  Although her exertional dyspnea may be little worse.  Evaluating for potential ischemic etiology given abnormal findings on cardiac stress.  Will plan for cardiac catheterization.  She does not really noticed PND or orthopnea but does have exertional dyspnea and trivial edema. Continue carvedilol  12.5 mg twice daily along with losartan  50 mg daily Continue spironolactone  Monday Wednesday Friday with 80 mg furosemide  daily and additional doses per sliding scale.

## 2024-04-02 NOTE — Progress Notes (Signed)
 Cardiology Office Note:  .   Date:  04/02/2024  ID:  [name], DOB 12-31-1940, MRN [id number] PCP/Referring Provider: Katrinka Garnette KIDD, MD  [facility] Providers Cardiologist:  Alm Clay, MD Electrophysiologist:  Jerel Balding, MD     No chief complaint on file.   Patient Profile: .     [name] is a very pleasant 83 y.o. female  with a PMH reviewed below who presents here for routine follow-up to discuss results of her Cardiac Stress PET ordered to evaluate symptoms concerning for atypical angina and exertional dyspnea.  At the request of Katrinka Garnette KIDD, MD.  CV History  CAD - 07/2010 - Progressive Angina: BMS PCI RCA & LAD (pre-op Back Sgx)  03/2011 Unstable Angina => LAD proximal ISR -> Overlapping DES w/ PTCA of jailed D2 02/2012: Patent Stents -> 08/2012 (peri-Op MI) patent stents Most recent cath was July 2016: Widely patent LAD stents with D2 jailed.  Minimal D2 stenosis.  Proximal to mid RCA BMS with less than 10% ISR.  OM1 and D1 60% stenosis.-Stable.  Myoview  May 2021: No ischemia or infarct.  LOW RISK => has chronic intermittent severe chest pain which she calls angina Chronic HFpEF Chronic bilateral LE edema Celiac Artery Stenosis - s/p Stent (2018) Bilateral Renal Atery Stenosis -> s/p L RA Stent (2011) Paroxysmal atrial tachycardia -> last anywhere from 30 seconds to 10 minutes. Symptomatic bradycardia with sinus pauses, Mobitz 2 AVB => s/p dual-chamber PPM Paroxysmal atrial fibrillation -> has short bursts lasting just a few hours. HLD-statin myopathy.  Unwilling to try any cholesterol medication    [name] was last seen on February 23, 2024 for a routine follow-up noting increased episodes of exertional dyspnea now with minimal exertion associated with chest discomfort and palpitations.  Episodes typically occur with activity and resolves with sitting down.  Sometimes palpitations are related to chest pain Vascepa from time to time they are  not happening at the same time.  She has not had an episode where she felt like she needed to take amiodarone  for breakthrough A-fib, stating that these episodes are not lasting long enough.  In addition to having a chest tightness or pressure that can happen with exertion she also notes that can sometimes happen when she is lying down at night to sleep.  Stable edema for which she takes furosemide .. We decided to order a Cardiac Stress PET to assess her chest comfort Due to the possibility of potentially evaluating for microvascular disease with coronary blood flow reserve.  This was at the recommendations of Dr. Balding.  Subjective  [name] returns here today to discuss the results of her stress test that were abnormal albeit not high risk.  She says that she is doing okay is still able to get out and do what she wants to do but does have to stop because of the chest discomfort and shortness of breath.  No notable change compared to last visit she had tried to take nitroglycerin  a couple times and the symptoms and did not necessarily note that it improved the symptoms but the nitroglycerin  prescription is old.  Cardiovascular ROS: positive for - chest pain, dyspnea on exertion, edema, and chest pain with both exertion but also at night with sleep.  Less prominent palpitations. negative for - orthopnea, paroxysmal nocturnal dyspnea, rapid heart rate, shortness of breath, or syncope or near syncope, TIA or amaurosis fugax, claudication    Objective   Current Meds  Medication Sig   alendronate  (FOSAMAX ) 70 MG tablet TAKE 1 TABLET (70 MG TOTAL) BY MOUTH EVERY 7 DAYS WITH FULL GLASS WATER ON EMPTY STOMACH   allopurinol  (ZYLOPRIM ) 100 MG tablet TAKE 1 TABLET BY MOUTH EVERY DAY   amiodarone  (PACERONE ) 200 MG tablet TAKE 400 MG (2 TABLETS) ONE DOSE AS NEED FOR A FAST HEART RATE ,IF AFTER 12 HOURS STILL PRESENT TAKE 200 MG ( 1 TABLET) ONE DOSE FOR EACH EPISODE   apixaban  (ELIQUIS ) 5 MG TABS  tablet Take 1 tablet (5 mg total) by mouth 2 (two) times daily.   Artificial Tear Solution (TEARS RENEWED OP) Apply 1 drop to eye daily as needed (dry eyes).   bismuth subsalicylate (PEPTO BISMOL) 262 MG/15ML suspension Take 30 mLs by mouth every 6 (six) hours as needed for diarrhea or loose stools.   carvedilol  (COREG ) 12.5 MG tablet Take 1 tablet (12.5 mg total) by mouth 2 (two) times daily. May take an additional 12.5 mg if needed for extra palp.   colchicine 0.6 MG tablet Take by mouth.   cyanocobalamin  (VITAMIN B12) 1000 MCG tablet Take 1,000 mcg by mouth daily.   diclofenac sodium (VOLTAREN) 1 % GEL Apply 2 g topically 4 (four) times daily as needed (pain).   furosemide  (LASIX ) 80 MG tablet TAKE 1 TABLET(80 MG) BY MOUTH ONCE A DAY , MAY TAKE AN ADDITIONAL 1 TABLET IF NEEDED FOR SWELLING   HYDROcodone -acetaminophen  (NORCO/VICODIN) 5-325 MG tablet Take 1 tablet by mouth every 6 (six) hours as needed for moderate pain (pain score 4-6).   hyoscyamine  (ANASPAZ ) 0.125 MG TBDP disintergrating tablet Place 1 tablet (0.125 mg total) under the tongue every 6 (six) hours as needed (abdominal cramping).   indapamide  (LOZOL ) 2.5 MG tablet Take 2.5 mg by mouth daily.   isosorbide  mononitrate (IMDUR ) 60 MG 24 hr tablet Take 1 tablet (60 mg total) by mouth daily. May take an extra dose if you use Nitroglycerin   sublingual tablet for 2 days afterwards   lidocaine  (XYLOCAINE ) 5 % ointment Apply 1 application. topically as needed.   loperamide (IMODIUM A-D) 2 MG tablet Take 2 mg by mouth 4 (four) times daily as needed for diarrhea or loose stools.   losartan  (COZAAR ) 50 MG tablet TAKE 1 TABLET BY MOUTH EVERY DAY   meclizine  (ANTIVERT ) 12.5 MG tablet Take 1 tablet (12.5 mg total) by mouth 2 (two) times daily as needed for dizziness.   nitroGLYCERIN  (NITROSTAT ) 0.4 MG SL tablet DISSOLVE 1 TABLET UNDER THE TONGUE EVERY 5 MINUTES AS NEEDED FOR CHEST PAIN   nystatin  (MYCOSTATIN /NYSTOP ) powder Apply 1 Application  topically 3 (three) times daily.   predniSONE  (DELTASONE ) 50 MG tablet Please take Prednisone  50mg  by mouth  9:30 pm on Monday,3:30 am on Tuesday and prior to leaving home please take last dose of Prednisone  50mg  and Benadryl  50mg  by mouth at 8:30 am on Tuesday   solifenacin  (VESICARE ) 10 MG tablet Take 1 tablet (10 mg total) by mouth daily.   spironolactone  (ALDACTONE ) 25 MG tablet TAKE 1 TABLET BY MOUTH AS DIRECTED. TAKE 1 TABLET ON MONDAYS, WEDNESDAYS, AND FRIDAYS AT 6 PM.   Studies Reviewed: .        Cardiac Stress PET: INTERMEDIATE RISK due to decrease in blood flow reserve-1 point cannot exclude microvascular disease)..  Abnormal perfusion with evidence of ischemia-small defect with moderate reductions in the basal septal and apex location there is reversible with normal motion.  Unusual vascular territory the findings would be consistent with ischemia.  Rest  EF 60%.  Stress EF 69%.  Aortic atherosclerosis noted.  2 device leads noted in the right atrial Penninger right ventricle apex.  Previous Pertinent Studies Lexiscan  Myoview  : Hyperdynamic LV EF 73%.  LOW RISK.  No RWMA.  (01/23/2017) Left Heart Cath and Coronary Angiography: 60% ostial D1 and OM1.  5 to 10% ISR in RCA and overlapping LAD stents with 30% proximal and 30 to 20% mid RCA on either side of the stent => added Imdur  for potential spasm (03/16/2015)  Risk Assessment/Calculations:    CHA2DS2-VASc Score = 5   This indicates a 7.2% annual risk of stroke. The patient's score is based upon: CHF History: 0 HTN History: 1 Diabetes History: 0 Stroke History: 0 Vascular Disease History: 1 Age Score: 2 Gender Score: 1            Physical Exam:   VS:  BP 125/79 (BP Location: Left Arm, Patient Position: Sitting)   Pulse 82   Ht 5' 8 (1.727 m)   Wt 195 lb 9.6 oz (88.7 kg)   SpO2 97%   BMI 29.74 kg/m    Wt Readings from Last 3 Encounters:  04/02/24 195 lb 9.6 oz (88.7 kg)  03/11/24 203 lb 4 oz (92.2 kg)  02/25/24 204 lb  6.4 oz (92.7 kg)    GEN: Well nourished, well groomed in no acute distress; borderline obese but otherwise healthy-appearing. NECK: No JVD; No carotid bruits CARDIAC: Normal S1, S2; RRR, no murmurs, rubs, gallops RESPIRATORY:  Clear to auscultation; nonlabored, good air movement. ABDOMEN: Soft, non-tender, non-distended EXTREMITIES: Grossly 1+ BLE edema; No deformity; walks with cane     ASSESSMENT AND PLAN: .    Problem List Items Addressed This Visit       Cardiology Problems   Atypical angina (HCC) - Primary   Atypical anginal symptoms.  Some exertional some nonexertional.  Longstanding chest pain symptoms with recent ones are more associated with dyspnea. Evaluated with a cardiac stress PET which was read as intermediate risk. Plan for cardiac catheterization for definitive assessment.      Relevant Orders   Basic metabolic panel with GFR   CBC   CAD S/P percutaneous coronary angioplasty: pRCA BMS, mLAD BMS overlapped prox with DES for ISR (Chronic)   Distant history of BMS in the RCA very as well as overlapping BMS/DES in the LAD dating back to 2011.  Noted to be patent on multiple different cardiac catheterizations as well as stress test.  She is not on aspirin  or Plavix  because of Eliquis . Current stress PET suggests small areas of reversible ischemia which could potentially correlate with septal perforators, however cannot exclude the possibility of LAD in-stent restenosis.  Discussed that the definitive evaluation will be cardiac catheterization with left heart catheterization and coronary angiography with possible PCI and if no culprit lesion noted we can do evaluation for coronary flow reserve.  Procedure scheduled for August 19. See consent below.      Congestive heart failure with LV diastolic dysfunction, NYHA class 2 (HCC) (Chronic)   Stable class II symptoms.  Although her exertional dyspnea may be little worse.  Evaluating for potential ischemic etiology given  abnormal findings on cardiac stress.  Will plan for cardiac catheterization.  She does not really noticed PND or orthopnea but does have exertional dyspnea and trivial edema. Continue carvedilol  12.5 mg twice daily along with losartan  50 mg daily Continue spironolactone  Monday Wednesday Friday with 80 mg furosemide  daily and additional doses per sliding scale.  Coronary artery disease involving native coronary artery of native heart with angina pectoris (HCC) (Chronic)   Known history of CAD with longstanding symptoms concerning for potential angina but we have never really failure fully determine.  Consideration has been either musculoskeletal versus microvascular disease related. For this reason we chose a cardiac stress PET to evaluate for ischemia which did suggest potential reduced coronary blood flow, but since there was evidence of also reversible anterior defect in the setting of an rapping LAD stents, we will exclude ISR with invasive with coronary angiography.  Plan: Schedule Cardiac Catheterization with Coronary Angiography plus or Minus PCI and possible percutaneous evaluation of Coronary Flow Reserve if no culprit lesion noted. Again not on aspirin  because of Eliquis . Continue carvedilol  along with Imdur , but consider the possibility of converting to Ranexa where she to have no macrovascular disease. Continue afterload reduction with losartan  and intermittent spironolactone .      Hypercoagulable state due to paroxysmal atrial fibrillation (HCC) (Chronic)   On Eliquis  for CHA2DS2-VASc score 5.  Okay to hold for for procedure 2 to 3 days preop. Plan to hold 3 days preop for catheterization.      Hyperlipidemia associated with type 2 diabetes mellitus (HCC) (Chronic)   Per multiple discussions, she is not actually taking any cholesterol medications which as usual drives a lot show I laughed from her husband.  She understands the risks but is adamant about not taking  anything.  As such, we are not following lipids. We did stress healthy diet.   Last A1c was 6.2, and currently not on medications.      Labile hypertension (Chronic)   Much better controlled as opposed to last visit.  Continue current regimen with losartan  and carvedilol  plus Imdur  and Lasix .      PAF (paroxysmal atrial fibrillation) (HCC) (Chronic)   Monitor with PPM assessment TAVR no prolonged episodes.  She has not had anything for which she has had to take amiodarone .  I suspect she may be more noting some PAT runs which are more frequent. -Continue carvedilol  12.5 mg twice daily for rate control and for symptomatic breakthrough of A-fib she does appear in. -Continue Eliquis .      PAT (paroxysmal atrial tachycardia) (HCC) (Chronic)   Relatively well-controlled.  Continue carvedilol  12.5 mg twice daily.        Other   Abnormal pharmacologic myocardial perfusion study (Chronic)   Difficult to interpret the study.  I did discuss it with imaging cardiologist.  The intention was to assess for possibility of microvascular blood flow reserve which was suggested but there is also potential evidence of ischemia making the question of possible LAD stent restenosis or Denovo lesion.  As such, plans for cardiac catheterization with possible PCI. We have also planned to assess coronary flow reserve if no culprit lesion is noted.      Relevant Orders   Basic metabolic panel with GFR   CBC   Exertional dyspnea (Chronic)   Cardiac stress PET ordered to assess for possible ischemic either macrovascular or microvascular etiology.  Read as intermediate risk which therefore warrants further evaluation with cardiac catheterization.  EF preserved, and otherwise no PND orthopnea with trivial edema arguing against major role of HFpEF.      Stasis edema of bilateral lower extremity (Chronic)   Continue Lasix  with intermittent spironolactone . She does not have Zaroxolyn  anymore.            Informed Consent   Shared Decision Making/Informed Consent The risks [  stroke (1 in 1000), death (1 in 1000), kidney failure [usually temporary] (1 in 500), bleeding (1 in 200), allergic reaction [possibly serious] (1 in 200)], benefits (diagnostic support and management of coronary artery disease) and alternatives of a cardiac catheterization were discussed in detail with Ms. Reh and she is willing to proceed.      Follow-Up: Return in about 6 weeks (around 05/11/2024).  I spent 45 minutes in the care of CENA BRUHN today including reviewing labs (1 minute), reviewing studies (6 minutes reviewing results of cardiac stress PET and discussing with imaging cardiologist), face to face time discussing treatment options (23 minutes), reviewing records from previous notes and cardiac cath/stress test results (2 minutes), 13 minutes dictating, and documenting in the encounter.     Signed, Alm MICAEL Clay, MD, MS Alm Clay, M.D., M.S. Interventional Chartered certified accountant  Pager # [phone]

## 2024-04-02 NOTE — Assessment & Plan Note (Signed)
 Much better controlled as opposed to last visit.  Continue current regimen with losartan  and carvedilol  plus Imdur  and Lasix .

## 2024-04-02 NOTE — Assessment & Plan Note (Signed)
 Monitor with PPM assessment TAVR no prolonged episodes.  She has not had anything for which she has had to take amiodarone .  I suspect she may be more noting some PAT runs which are more frequent. -Continue carvedilol  12.5 mg twice daily for rate control and for symptomatic breakthrough of A-fib she does appear in. -Continue Eliquis .

## 2024-04-02 NOTE — Patient Instructions (Addendum)
 Medication Instructions:  No changes  *If you need a refill on your cardiac medications before your next appointment, please call your pharmacy*   Lab Work: CBC BMP  If you have labs (blood work) drawn today and your tests are completely normal, you will receive your results only by: MyChart Message (if you have MyChart) OR A paper copy in the mail If you have any lab test that is abnormal or we need to change your treatment, we will call you to review the results.  Testing/Procedures:  Your physician has requested that you have a cardiac catheterization for Aug 19,2025. Cardiac catheterization is used to diagnose and/or treat various heart conditions. Doctors may recommend this procedure for a number of different reasons. The most common reason is to evaluate chest pain. Chest pain can be a symptom of coronary artery disease (CAD), and cardiac catheterization can show whether plaque is narrowing or blocking your heart's arteries. This procedure is also used to evaluate the valves, as well as measure the blood flow and oxygen levels in different parts of your heart. Please follow instruction sheet, as given.   Follow-Up: At [facility], you and your health needs are our priority.  As part of our continuing mission to provide you with exceptional heart care, we have created designated Provider Care Teams.  These Care Teams include your primary Cardiologist (physician) and Advanced Practice Providers (APPs -  Physician Assistants and Nurse Practitioners) who all work together to provide you with the care you need, when you need it.     Your next appointment:   5 to 6  week(s)  The format for your next appointment:   In Person  Provider:   Alm Clay, MD   Other Instructions   [facility] A DEPT OF [place]. [facility] [address] A DEPT OF THE [place]. [facility] [address] [place] [address] Dept: [phone] Loc:  [phone]  TYAH ACORD  04/02/2024  You are scheduled for a Cardiac Catheterization on Tuesday, August 19 with Dr. Alm Clay.  1. Please arrive at the [facility] (Main Entrance A) at [facility]: [address] [place], [place] [address] at 8:30 AM (This time is 2 hour(s) before your procedure to ensure your preparation).   Free valet parking service is available. You will check in at ADMITTING. The support person will be asked to wait in the waiting room.  It is OK to have someone drop you off and come back when you are ready to be discharged.    Special note: Every effort is made to have your procedure done on time. Please understand that emergencies sometimes delay scheduled procedures.  2. Diet: No solid foods after midnight. You may have clear liquids until you arrive at the hospital.  List of approved liquids water, clear juice, clear tea, black coffee, fruit juices, non-citric and without pulp, carbonated beverages, Gatorade, Kool -Aid, plain Jello-O and plain ice popsicles.   3. Hydration: You need to be well hydrated before your procedure time. You may drink approved liquids (see below) until you arrive at the hospital. On the way to the hospital, please drink a 16-oz (1 plastic bottle) of water.   List of approved liquids water, clear juice, clear tea, black coffee, fruit juices, non-citric and without pulp, carbonated beverages, Gatorade, Kool -Aid, plain Jello-O and plain ice popsicles.   4. Labs: CBC, BMP . You do not need to be fasting.  5. Medication instructions in preparation for your procedure:   Contrast Allergy: Yes, Please take Prednisone  50mg  by mouth at: Thirteen hours prior to cath 9:30 pm on Monday Seven hours prior to cath 3:30 am on Tuesday And prior to leaving home please take last dose of Prednisone  50mg  and Benadryl  50mg  by mouth. 8:30 am    Stop taking Eliquis  (Apixiban) on Saturday, August 16. The last dose until catheterization    Stop taking, Cozaar  (Losartan ) Tuesday, August 19,, Lasix  (Furosemide )  and Spironolactone   Tuesday, August 19,    On the morning of your procedure, take your Aspirin  81 mg and any morning medicines NOT listed above.  You may use sips of water.  6. Plan to go home the same day, you will only stay overnight if medically necessary. 7. Bring a current list of your medications and current insurance cards. 8. You MUST have a responsible person to drive you home. 9. Someone MUST be with you the first 24 hours after you arrive home or your discharge will be delayed. 10. Please wear clothes that are easy to get on and off and wear slip-on shoes.  Thank you for allowing us  to care for you!   -- [facility]

## 2024-04-02 NOTE — Assessment & Plan Note (Signed)
 On Eliquis  for CHA2DS2-VASc score 5.  Okay to hold for for procedure 2 to 3 days preop. Plan to hold 3 days preop for catheterization.

## 2024-04-02 NOTE — Assessment & Plan Note (Signed)
 Known history of CAD with longstanding symptoms concerning for potential angina but we have never really failure fully determine.  Consideration has been either musculoskeletal versus microvascular disease related. For this reason we chose a cardiac stress PET to evaluate for ischemia which did suggest potential reduced coronary blood flow, but since there was evidence of also reversible anterior defect in the setting of an rapping LAD stents, we will exclude ISR with invasive with coronary angiography.  Plan: Schedule Cardiac Catheterization with Coronary Angiography plus or Minus PCI and possible percutaneous evaluation of Coronary Flow Reserve if no culprit lesion noted. Again not on aspirin  because of Eliquis . Continue carvedilol  along with Imdur , but consider the possibility of converting to Ranexa where she to have no macrovascular disease. Continue afterload reduction with losartan  and intermittent spironolactone .

## 2024-04-02 NOTE — Assessment & Plan Note (Signed)
 Per multiple discussions, she is not actually taking any cholesterol medications which as usual drives a lot show I laughed from her husband.  She understands the risks but is adamant about not taking anything.  As such, we are not following lipids. We did stress healthy diet.   Last A1c was 6.2, and currently not on medications.

## 2024-04-02 NOTE — Assessment & Plan Note (Signed)
 Relatively well-controlled.  Continue carvedilol  12.5 mg twice daily.

## 2024-04-02 NOTE — Assessment & Plan Note (Signed)
 Distant history of BMS in the RCA very as well as overlapping BMS/DES in the LAD dating back to 2011.  Noted to be patent on multiple different cardiac catheterizations as well as stress test.  She is not on aspirin  or Plavix  because of Eliquis . Current stress PET suggests small areas of reversible ischemia which could potentially correlate with septal perforators, however cannot exclude the possibility of LAD in-stent restenosis.  Discussed that the definitive evaluation will be cardiac catheterization with left heart catheterization and coronary angiography with possible PCI and if no culprit lesion noted we can do evaluation for coronary flow reserve.  Procedure scheduled for August 19. See consent below.

## 2024-04-02 NOTE — Assessment & Plan Note (Signed)
 Atypical anginal symptoms.  Some exertional some nonexertional.  Longstanding chest pain symptoms with recent ones are more associated with dyspnea. Evaluated with a cardiac stress PET which was read as intermediate risk. Plan for cardiac catheterization for definitive assessment.

## 2024-04-02 NOTE — Assessment & Plan Note (Addendum)
 Cardiac stress PET ordered to assess for possible ischemic either macrovascular or microvascular etiology.  Read as intermediate risk which therefore warrants further evaluation with cardiac catheterization.  EF preserved, and otherwise no PND orthopnea with trivial edema arguing against major role of HFpEF.

## 2024-04-02 NOTE — Assessment & Plan Note (Signed)
 Difficult to interpret the study.  I did discuss it with imaging cardiologist.  The intention was to assess for possibility of microvascular blood flow reserve which was suggested but there is also potential evidence of ischemia making the question of possible LAD stent restenosis or Denovo lesion.  As such, plans for cardiac catheterization with possible PCI. We have also planned to assess coronary flow reserve if no culprit lesion is noted.

## 2024-04-02 NOTE — Assessment & Plan Note (Signed)
 Continue Lasix  with intermittent spironolactone . She does not have Zaroxolyn  anymore.

## 2024-04-03 ENCOUNTER — Ambulatory Visit: Payer: Self-pay | Admitting: Cardiology

## 2024-04-03 DIAGNOSIS — R899 Unspecified abnormal finding in specimens from other organs, systems and tissues: Secondary | ICD-10-CM

## 2024-04-03 DIAGNOSIS — I25119 Atherosclerotic heart disease of native coronary artery with unspecified angina pectoris: Secondary | ICD-10-CM

## 2024-04-03 LAB — CBC
Hematocrit: 36.9 % (ref 34.0–46.6)
Hemoglobin: 12 g/dL (ref 11.1–15.9)
MCH: 30.2 pg (ref 26.6–33.0)
MCHC: 32.5 g/dL (ref 31.5–35.7)
MCV: 93 fL (ref 79–97)
Platelets: 306 x10E3/uL (ref 150–450)
RBC: 3.98 x10E6/uL (ref 3.77–5.28)
RDW: 14.3 % (ref 11.7–15.4)
WBC: 8.4 x10E3/uL (ref 3.4–10.8)

## 2024-04-03 LAB — BASIC METABOLIC PANEL WITH GFR
BUN/Creatinine Ratio: 32 — ABNORMAL HIGH (ref 12–28)
BUN: 53 mg/dL — ABNORMAL HIGH (ref 8–27)
CO2: 18 mmol/L — ABNORMAL LOW (ref 20–29)
Calcium: 9.6 mg/dL (ref 8.7–10.3)
Chloride: 102 mmol/L (ref 96–106)
Creatinine, Ser: 1.66 mg/dL — ABNORMAL HIGH (ref 0.57–1.00)
Glucose: 100 mg/dL — ABNORMAL HIGH (ref 70–99)
Potassium: 3.7 mmol/L (ref 3.5–5.2)
Sodium: 138 mmol/L (ref 134–144)
eGFR: 31 mL/min/1.73 — ABNORMAL LOW (ref >59)

## 2024-04-05 NOTE — Telephone Encounter (Signed)
-----   Message from Alm Clay sent at 04/03/2024 12:35 PM EDT ----- Labs so that you look to be a little bit dehydrated your kidney function is a little bit down and there are BUN which is a marker of being dehydrated is.  Recommend that you increase your fluid intake and reduce your furosemide  dose to one half daily for 3 days and then lets reassess chemistry panel near the end of next week.  Would like to make sure  that the kidney function looks a little bit better prior to heart catheterization.  Alm Clay, MD  ----- Message ----- From: Interface, [facility] Lab Results In Sent: 04/03/2024   2:35 AM EDT To: Alm LELON Clay, MD

## 2024-04-05 NOTE — Telephone Encounter (Signed)
 The patient has been notified of the result and verbalized understanding.  All questions (if any) were answered. Gladis Reena GAILS, RN 04/05/2024 5:36 PM   patient stated she take 2 tablets a day of the furosemide  ( fluid pill) . RN informed her if she's taking 2 tablets , then take 1 tablet a day for the next 3 days, then have lab work done Friday 04/09/24. Patient verbalized understanding. Order placed

## 2024-04-06 DIAGNOSIS — M25562 Pain in left knee: Secondary | ICD-10-CM | POA: Diagnosis not present

## 2024-04-06 DIAGNOSIS — C499 Malignant neoplasm of connective and soft tissue, unspecified: Secondary | ICD-10-CM | POA: Diagnosis not present

## 2024-04-09 ENCOUNTER — Other Ambulatory Visit: Payer: Self-pay | Admitting: *Deleted

## 2024-04-09 ENCOUNTER — Encounter

## 2024-04-09 DIAGNOSIS — I25119 Atherosclerotic heart disease of native coronary artery with unspecified angina pectoris: Secondary | ICD-10-CM

## 2024-04-09 DIAGNOSIS — R899 Unspecified abnormal finding in specimens from other organs, systems and tissues: Secondary | ICD-10-CM

## 2024-04-10 LAB — BASIC METABOLIC PANEL WITH GFR
BUN/Creatinine Ratio: 36 — ABNORMAL HIGH (ref 12–28)
BUN: 45 mg/dL — ABNORMAL HIGH (ref 8–27)
CO2: 16 mmol/L — ABNORMAL LOW (ref 20–29)
Calcium: 8.9 mg/dL (ref 8.7–10.3)
Chloride: 104 mmol/L (ref 96–106)
Creatinine, Ser: 1.24 mg/dL — ABNORMAL HIGH (ref 0.57–1.00)
Glucose: 116 mg/dL — ABNORMAL HIGH (ref 70–99)
Potassium: 4.2 mmol/L (ref 3.5–5.2)
Sodium: 136 mmol/L (ref 134–144)
eGFR: 43 mL/min/1.73 — ABNORMAL LOW (ref >59)

## 2024-04-11 ENCOUNTER — Ambulatory Visit: Payer: Self-pay | Admitting: Cardiology

## 2024-04-14 ENCOUNTER — Other Ambulatory Visit: Payer: Self-pay

## 2024-04-14 ENCOUNTER — Encounter

## 2024-04-14 DIAGNOSIS — C499 Malignant neoplasm of connective and soft tissue, unspecified: Secondary | ICD-10-CM

## 2024-04-15 ENCOUNTER — Telehealth: Payer: Self-pay | Admitting: *Deleted

## 2024-04-15 ENCOUNTER — Inpatient Hospital Stay: Attending: Hematology & Oncology

## 2024-04-15 ENCOUNTER — Inpatient Hospital Stay (HOSPITAL_BASED_OUTPATIENT_CLINIC_OR_DEPARTMENT_OTHER): Admitting: Hematology & Oncology

## 2024-04-15 VITALS — BP 132/40 | HR 67 | Temp 98.0°F | Resp 19 | Ht 68.0 in | Wt 198.8 lb

## 2024-04-15 DIAGNOSIS — C499 Malignant neoplasm of connective and soft tissue, unspecified: Secondary | ICD-10-CM

## 2024-04-15 DIAGNOSIS — Z08 Encounter for follow-up examination after completed treatment for malignant neoplasm: Secondary | ICD-10-CM | POA: Insufficient documentation

## 2024-04-15 DIAGNOSIS — Z85831 Personal history of malignant neoplasm of soft tissue: Secondary | ICD-10-CM | POA: Diagnosis not present

## 2024-04-15 LAB — CBC WITH DIFFERENTIAL (CANCER CENTER ONLY)
Abs Immature Granulocytes: 0.03 K/uL (ref 0.00–0.07)
Basophils Absolute: 0.1 K/uL (ref 0.0–0.1)
Basophils Relative: 1 %
Eosinophils Absolute: 0.3 K/uL (ref 0.0–0.5)
Eosinophils Relative: 4 %
HCT: 31.9 % — ABNORMAL LOW (ref 36.0–46.0)
Hemoglobin: 10.8 g/dL — ABNORMAL LOW (ref 12.0–15.0)
Immature Granulocytes: 0 %
Lymphocytes Relative: 19 %
Lymphs Abs: 1.2 K/uL (ref 0.7–4.0)
MCH: 30.9 pg (ref 26.0–34.0)
MCHC: 33.9 g/dL (ref 30.0–36.0)
MCV: 91.1 fL (ref 80.0–100.0)
Monocytes Absolute: 0.7 K/uL (ref 0.1–1.0)
Monocytes Relative: 11 %
Neutro Abs: 4.4 K/uL (ref 1.7–7.7)
Neutrophils Relative %: 65 %
Platelet Count: 226 K/uL (ref 150–400)
RBC: 3.5 MIL/uL — ABNORMAL LOW (ref 3.87–5.11)
RDW: 14.7 % (ref 11.5–15.5)
WBC Count: 6.7 K/uL (ref 4.0–10.5)
nRBC: 0 % (ref 0.0–0.2)

## 2024-04-15 LAB — CMP (CANCER CENTER ONLY)
ALT: 7 U/L (ref 0–44)
AST: 11 U/L — ABNORMAL LOW (ref 15–41)
Albumin: 4 g/dL (ref 3.5–5.0)
Alkaline Phosphatase: 76 U/L (ref 38–126)
Anion gap: 14 (ref 5–15)
BUN: 41 mg/dL — ABNORMAL HIGH (ref 8–23)
CO2: 20 mmol/L — ABNORMAL LOW (ref 22–32)
Calcium: 9.3 mg/dL (ref 8.9–10.3)
Chloride: 101 mmol/L (ref 98–111)
Creatinine: 1.43 mg/dL — ABNORMAL HIGH (ref 0.44–1.00)
GFR, Estimated: 36 mL/min — ABNORMAL LOW (ref >60)
Glucose, Bld: 108 mg/dL — ABNORMAL HIGH (ref 70–99)
Potassium: 4.4 mmol/L (ref 3.5–5.1)
Sodium: 135 mmol/L (ref 135–145)
Total Bilirubin: 0.4 mg/dL (ref 0.0–1.2)
Total Protein: 7 g/dL (ref 6.5–8.1)

## 2024-04-15 LAB — LACTATE DEHYDROGENASE: LDH: 176 U/L (ref 98–192)

## 2024-04-15 NOTE — Progress Notes (Signed)
 [facility] Follow Up Visit  [name] [id number] 1941/04/22 83 y.o. 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  She actually saw Dr. Tanda of Orthopedic Oncology at [facility].  She saw him on 04/06/2024.  He did not find anything that was suspicious.  However, he wanted to follow-up in 6 months along with a MRI.  She is going to have cardiac cath next week.  Hopefully, everything will be okay and she will not need to have any type of cardiac surgery.  We did do a PET scan.  The PET scan was done on 03/10/2024.  The PET scan did not show anything that look like residual or recurrent sarcoma.  Everything was physiologic.  She has had no problems with nausea or vomiting.  There is no cough or shortness of breath.  She has had no fever.  There has been no change in bowel or bladder habits.  She has had some occasional leg swelling of the left leg.  She has had no rashes.  There has been no bleeding.  Overall, I would say that her performance status is ECOG 1.    Medications:  Current Outpatient Medications:    alendronate  (FOSAMAX ) 70 MG tablet, TAKE 1 TABLET (70 MG TOTAL) BY MOUTH EVERY 7 DAYS WITH FULL GLASS WATER ON EMPTY STOMACH, Disp: 12 tablet, Rfl: 3   allopurinol  (ZYLOPRIM ) 100 MG tablet, TAKE 1 TABLET BY MOUTH EVERY DAY, Disp: 90 tablet, Rfl: 2   amiodarone  (PACERONE ) 200 MG tablet, TAKE 400 MG (2 TABLETS) ONE DOSE AS NEED FOR A FAST HEART RATE ,IF AFTER 12 HOURS STILL PRESENT TAKE 200 MG ( 1 TABLET) ONE DOSE FOR EACH EPISODE, Disp: 180 tablet, Rfl: 1   apixaban  (ELIQUIS ) 5 MG TABS tablet, Take 1 tablet (5 mg total) by mouth 2 (two) times daily., Disp: 180 tablet, Rfl: 3   Artificial Tear Solution (TEARS RENEWED OP), Apply 1 drop to eye daily as needed (dry  eyes)., Disp: , Rfl:    bismuth  subsalicylate (PEPTO BISMOL) 262 MG/15ML suspension, Take 30 mLs by mouth every 6 (six) hours as needed for diarrhea or loose stools., Disp: , Rfl:    carvedilol  (COREG ) 12.5 MG tablet, Take 1 tablet (12.5 mg total) by mouth 2 (two) times daily. May take an additional 12.5 mg if needed for extra palp., Disp: 180 tablet, Rfl: 3   colchicine 0.6 MG tablet, Take by mouth., Disp: , Rfl:    cyanocobalamin  (VITAMIN B12) 1000 MCG tablet, Take 1,000 mcg by mouth daily., Disp: , Rfl:    diclofenac sodium (VOLTAREN) 1 % GEL, Apply 2 g topically 4 (four) times daily as needed (pain)., Disp: , Rfl:    furosemide  (LASIX ) 80 MG tablet, TAKE 1 TABLET(80 MG) BY MOUTH ONCE A DAY , MAY TAKE AN ADDITIONAL 1 TABLET IF NEEDED FOR SWELLING, Disp: 180 tablet, Rfl: 3   HYDROcodone -acetaminophen  (NORCO/VICODIN) 5-325 MG tablet, Take 1 tablet by mouth every 6 (six) hours as needed for moderate pain (pain score 4-6)., Disp: 30 tablet, Rfl: 0   hyoscyamine  (ANASPAZ ) 0.125 MG TBDP disintergrating tablet, Place 1 tablet (0.125 mg total) under the tongue every 6 (six) hours as needed (abdominal cramping)., Disp: 30 tablet, Rfl: 5   indapamide  (LOZOL ) 2.5 MG tablet, Take 2.5 mg by  mouth daily., Disp: , Rfl:    isosorbide  mononitrate (IMDUR ) 60 MG 24 hr tablet, Take 1 tablet (60 mg total) by mouth daily. May take an extra dose if you use Nitroglycerin   sublingual tablet for 2 days afterwards, Disp: 100 tablet, Rfl: 3   lidocaine  (XYLOCAINE ) 5 % ointment, Apply 1 application. topically as needed., Disp: 35.44 g, Rfl: 0   loperamide (IMODIUM A-D) 2 MG tablet, Take 2 mg by mouth 4 (four) times daily as needed for diarrhea or loose stools., Disp: , Rfl:    losartan  (COZAAR ) 50 MG tablet, TAKE 1 TABLET BY MOUTH EVERY DAY, Disp: 90 tablet, Rfl: 3   meclizine  (ANTIVERT ) 12.5 MG tablet, Take 1 tablet (12.5 mg total) by mouth 2 (two) times daily as needed for dizziness., Disp: 60 tablet, Rfl: 2   nitroGLYCERIN   (NITROSTAT ) 0.4 MG SL tablet, DISSOLVE 1 TABLET UNDER THE TONGUE EVERY 5 MINUTES AS NEEDED FOR CHEST PAIN, Disp: 25 tablet, Rfl: 6   nystatin  (MYCOSTATIN /NYSTOP ) powder, Apply 1 Application topically 3 (three) times daily., Disp: 30 g, Rfl: 2   predniSONE  (DELTASONE ) 50 MG tablet, Please take Prednisone  50mg  by mouth  9:30 pm on Monday,3:30 am on Tuesday and prior to leaving home please take last dose of Prednisone  50mg  and Benadryl  50mg  by mouth at 8:30 am on Tuesday, Disp: 3 tablet, Rfl: 0   solifenacin  (VESICARE ) 10 MG tablet, Take 1 tablet (10 mg total) by mouth daily., Disp: 30 tablet, Rfl: 11   spironolactone  (ALDACTONE ) 25 MG tablet, TAKE 1 TABLET BY MOUTH AS DIRECTED. TAKE 1 TABLET ON MONDAYS, WEDNESDAYS, AND FRIDAYS AT 6 PM., Disp: 45 tablet, Rfl: 1  Allergies:  Allergies  Allergen Reactions   Codeine Phosphate Anaphylaxis, Shortness Of Breath and Swelling   Contrast Media [Iodinated Contrast Media] Swelling    Swelling of the face. Reaction was to ionic contrast many years ago. Patient has had non-ionic contrast many time without pre medication and has had no reaction.    Statins Other (See Comments)    Hyperactivity   Tramadol Palpitations and Other (See Comments)    Hyperactivity    Past Medical History, Surgical history, Social history, and Family History were reviewed and updated.  Review of Systems: Review of Systems  Constitutional: Negative.   HENT:  Negative.    Eyes: Negative.   Respiratory: Negative.    Cardiovascular: Negative.   Gastrointestinal: Negative.   Endocrine: Negative.   Genitourinary: Negative.    Musculoskeletal:  Positive for arthralgias.  Neurological: Negative.   Hematological: Negative.   Psychiatric/Behavioral: Negative.      Physical Exam:  height is 5' 8 (1.727 m) and weight is 198 lb 12.8 oz (90.2 kg). Her oral temperature is 98 F (36.7 C). Her blood pressure is 132/40 (abnormal) and her pulse is 67. Her respiration is 19 and oxygen  saturation is 98%.   Wt Readings from Last 3 Encounters:  04/15/24 198 lb 12.8 oz (90.2 kg)  04/02/24 195 lb 9.6 oz (88.7 kg)  03/11/24 203 lb 4 oz (92.2 kg)    Physical Exam Vitals reviewed.  HENT:     Head: Normocephalic and atraumatic.  Eyes:     Pupils: Pupils are equal, round, and reactive to light.  Cardiovascular:     Rate and Rhythm: Normal rate and regular rhythm.     Heart sounds: Normal heart sounds.  Pulmonary:     Effort: Pulmonary effort is normal.     Breath sounds: Normal breath sounds.  Abdominal:  General: Bowel sounds are normal.     Palpations: Abdomen is soft.  Musculoskeletal:        General: No tenderness or deformity. Normal range of motion.     Cervical back: Normal range of motion.     Comments: Behind the left knee is a surgical scar.  This is well-healed.  She has significant radiation dermatitis.  There is couple areas of open sores.  There is no obvious discharge.  She has a little bit of swelling.    Lymphadenopathy:     Cervical: No cervical adenopathy.  Skin:    General: Skin is warm and dry.     Findings: No erythema or rash.  Neurological:     Mental Status: She is alert and oriented to person, place, and time.  Psychiatric:        Behavior: Behavior normal.        Thought Content: Thought content normal.        Judgment: Judgment normal.      Lab Results  Component Value Date   WBC 6.7 04/15/2024   HGB 10.8 (L) 04/15/2024   HCT 31.9 (L) 04/15/2024   MCV 91.1 04/15/2024   PLT 226 04/15/2024     Chemistry      Component Value Date/Time   NA 135 04/15/2024 0917   NA 136 04/09/2024 1650   K 4.4 04/15/2024 0917   CL 101 04/15/2024 0917   CO2 20 (L) 04/15/2024 0917   BUN 41 (H) 04/15/2024 0917   BUN 45 (H) 04/09/2024 1650   CREATININE 1.43 (H) 04/15/2024 0917   CREATININE 0.90 10/24/2016 1127      Component Value Date/Time   CALCIUM 9.3 04/15/2024 0917   ALKPHOS 76 04/15/2024 0917   AST 11 (L) 04/15/2024 0917   ALT 7  04/15/2024 0917   BILITOT 0.4 04/15/2024 0917       Impression and Plan: Ms. Augsburger is a very charming 83 year old white female.  She has a sarcoma behind the left knee in the popliteal fossa.  This is a fairly high-grade sarcoma.  As such, she had this resected.  Her margins were positive.  We have given her radiotherapy.  She completed 6840 rad of radiotherapy on 04/30/2023.  From my point of view, everything looks okay.  I really do not see that we have to do any scans on her.  I know that Dr. Tanda Orthopedic Oncology is wanting to have a MRI in 6 months.  I feel confident we can wait that long.  We will plan to get her back in a couple of months at this point.   Maude JONELLE Crease, MD 8/14/202510:20 AM

## 2024-04-15 NOTE — Telephone Encounter (Addendum)
 Cardiac Catheterization scheduled at [facility] for: Tuesday April 20, 2024 10:30 AM Arrival time [facility] Main Entrance A at: 5:30 AM-pre-procedure hydration  Diet: -Nothing to eat after midnight prior to procedure.  Hydration: -May drink clear liquids until leaving for hospital. Approved liquids: Water, clear tea, black coffee, fruit juices-non-citric and without pulp,Gatorade, plain Jello/popsicles.  No PO hydration  (bottle of water) OTW to the hospital-going in early for IVF  CONTRAST ALLERGY: 13 hour Prednisone  and Benadryl  Prep: 04/19/24 Prednisone  50 mg at 9:30 PM 04/20/24 Prednisone  50 mg at 3:30 AM 04/20/24 Prednisone  50 mg and Benadryl  50 mg -will take with her to hospital and will take at 8:30 AM at the hospital. I have asked her to let Short Stay staff know she has the medication with her.  Medication instructions: -Hold:  Eliquis -none 04/18/24 until post procedure  Spironolactone /Lasix /Lozol -day before and day of procedure-per protocol GFR < 60  Dr Anner recommends that pt continue losartan . -Other usual morning medications can be taken including aspirin  81 mg.  Plan to go home the same day, you will only stay overnight if medically necessary.  You must have responsible adult to drive you home.  Someone must be with you the first 24 hours after you arrive home.  Reviewed procedure instructions/pre-procedure hydration with patient.

## 2024-04-19 NOTE — Telephone Encounter (Signed)
 Per Dr Arturo for pre-procedure hydration-IV flow rate -3 ml /kg/hr for 1 hour, then 1 ml /kg/hr continuous.

## 2024-04-20 ENCOUNTER — Encounter (HOSPITAL_COMMUNITY)
Admission: RE | Disposition: A | Discharge status: Home / Self Care | Payer: Self-pay | Source: Home / Self Care | Attending: Cardiology

## 2024-04-20 ENCOUNTER — Encounter (HOSPITAL_COMMUNITY): Payer: Self-pay | Admitting: Cardiology

## 2024-04-20 ENCOUNTER — Other Ambulatory Visit: Payer: Self-pay

## 2024-04-20 ENCOUNTER — Ambulatory Visit (HOSPITAL_COMMUNITY)
Admission: RE | Admit: 2024-04-20 | Discharge: 2024-04-21 | Disposition: A | Discharge status: Home / Self Care | Attending: Cardiology | Admitting: Cardiology

## 2024-04-20 DIAGNOSIS — Z7901 Long term (current) use of anticoagulants: Secondary | ICD-10-CM | POA: Diagnosis not present

## 2024-04-20 DIAGNOSIS — E119 Type 2 diabetes mellitus without complications: Secondary | ICD-10-CM | POA: Diagnosis not present

## 2024-04-20 DIAGNOSIS — D6869 Other thrombophilia: Secondary | ICD-10-CM | POA: Insufficient documentation

## 2024-04-20 DIAGNOSIS — I25119 Atherosclerotic heart disease of native coronary artery with unspecified angina pectoris: Secondary | ICD-10-CM | POA: Insufficient documentation

## 2024-04-20 DIAGNOSIS — I87303 Chronic venous hypertension (idiopathic) without complications of bilateral lower extremity: Secondary | ICD-10-CM | POA: Insufficient documentation

## 2024-04-20 DIAGNOSIS — Z955 Presence of coronary angioplasty implant and graft: Secondary | ICD-10-CM | POA: Insufficient documentation

## 2024-04-20 DIAGNOSIS — I480 Paroxysmal atrial fibrillation: Secondary | ICD-10-CM | POA: Diagnosis not present

## 2024-04-20 DIAGNOSIS — I2089 Other forms of angina pectoris: Secondary | ICD-10-CM

## 2024-04-20 DIAGNOSIS — Z7902 Long term (current) use of antithrombotics/antiplatelets: Secondary | ICD-10-CM | POA: Insufficient documentation

## 2024-04-20 DIAGNOSIS — Z79899 Other long term (current) drug therapy: Secondary | ICD-10-CM | POA: Diagnosis not present

## 2024-04-20 DIAGNOSIS — I110 Hypertensive heart disease with heart failure: Secondary | ICD-10-CM | POA: Insufficient documentation

## 2024-04-20 DIAGNOSIS — R9430 Abnormal result of cardiovascular function study, unspecified: Secondary | ICD-10-CM

## 2024-04-20 DIAGNOSIS — E785 Hyperlipidemia, unspecified: Secondary | ICD-10-CM | POA: Diagnosis not present

## 2024-04-20 DIAGNOSIS — I5032 Chronic diastolic (congestive) heart failure: Secondary | ICD-10-CM | POA: Insufficient documentation

## 2024-04-20 DIAGNOSIS — I2510 Atherosclerotic heart disease of native coronary artery without angina pectoris: Secondary | ICD-10-CM

## 2024-04-20 DIAGNOSIS — I4719 Other supraventricular tachycardia: Secondary | ICD-10-CM | POA: Insufficient documentation

## 2024-04-20 DIAGNOSIS — Z950 Presence of cardiac pacemaker: Secondary | ICD-10-CM | POA: Diagnosis not present

## 2024-04-20 HISTORY — PX: CORONARY STENT INTERVENTION: CATH118234

## 2024-04-20 HISTORY — PX: LEFT HEART CATH AND CORONARY ANGIOGRAPHY: CATH118249

## 2024-04-20 LAB — POCT ACTIVATED CLOTTING TIME
Activated Clotting Time: 239 s
Activated Clotting Time: 285 s
Activated Clotting Time: 279 s

## 2024-04-20 MED ORDER — HEPARIN SODIUM (PORCINE) 1000 UNIT/ML IJ SOLN
INTRAMUSCULAR | Status: AC
Start: 2024-04-20 — End: 2024-04-20
  Filled 2024-04-20: qty 10

## 2024-04-20 MED ORDER — FENTANYL CITRATE (PF) 100 MCG/2ML IJ SOLN
INTRAMUSCULAR | Status: AC
  Filled 2024-04-20: qty 2

## 2024-04-20 MED ORDER — IOHEXOL 350 MG/ML SOLN
INTRAVENOUS | Status: DC | PRN
  Administered 2024-04-20: 175 mL via INTRA_ARTERIAL

## 2024-04-20 MED ORDER — SODIUM CHLORIDE 0.9 % IV SOLN: INTRAVENOUS | Status: AC

## 2024-04-20 MED ORDER — ONDANSETRON HCL 4 MG/2ML IJ SOLN
INTRAMUSCULAR | Status: DC | PRN
  Administered 2024-04-20: 4 mg via INTRAVENOUS

## 2024-04-20 MED ORDER — PREDNISONE 20 MG PO TABS
50.0000 mg | ORAL_TABLET | Freq: Once | ORAL | Status: AC
  Administered 2024-04-20: 50 mg via ORAL
  Filled 2024-04-20: qty 1

## 2024-04-20 MED ORDER — LIDOCAINE HCL (PF) 1 % IJ SOLN
INTRAMUSCULAR | Status: AC
Start: 2024-04-20 — End: 2024-04-20
  Filled 2024-04-20: qty 30

## 2024-04-20 MED ORDER — FUROSEMIDE 40 MG PO TABS
80.0000 mg | ORAL_TABLET | Freq: Every day | ORAL | Status: DC
  Administered 2024-04-21: 80 mg via ORAL
  Filled 2024-04-20: qty 2

## 2024-04-20 MED ORDER — SODIUM CHLORIDE 0.9 % WEIGHT BASED INFUSION: 1.0000 mL/kg/h | INTRAVENOUS | Status: DC

## 2024-04-20 MED ORDER — FENTANYL CITRATE (PF) 100 MCG/2ML IJ SOLN
INTRAMUSCULAR | Status: DC | PRN
  Administered 2024-04-20 (×6): 25 ug via INTRAVENOUS

## 2024-04-20 MED ORDER — ISOSORBIDE MONONITRATE ER 60 MG PO TB24
60.0000 mg | ORAL_TABLET | Freq: Every day | ORAL | Status: DC
  Administered 2024-04-20 – 2024-04-21 (×2): 60 mg via ORAL
  Filled 2024-04-20 (×2): qty 1

## 2024-04-20 MED ORDER — CLOPIDOGREL BISULFATE 300 MG PO TABS
ORAL_TABLET | ORAL | Status: AC
  Filled 2024-04-20: qty 2

## 2024-04-20 MED ORDER — SPIRONOLACTONE 25 MG PO TABS
25.0000 mg | ORAL_TABLET | Freq: Every day | ORAL | Status: DC
  Administered 2024-04-21: 25 mg via ORAL
  Filled 2024-04-20: qty 1

## 2024-04-20 MED ORDER — METOPROLOL TARTRATE 5 MG/5ML IV SOLN
INTRAVENOUS | Status: DC | PRN
  Administered 2024-04-20: 5 mg via INTRAVENOUS

## 2024-04-20 MED ORDER — HEPARIN SODIUM (PORCINE) 1000 UNIT/ML IJ SOLN
INTRAMUSCULAR | Status: AC
  Filled 2024-04-20: qty 10

## 2024-04-20 MED ORDER — LOSARTAN POTASSIUM 50 MG PO TABS
50.0000 mg | ORAL_TABLET | Freq: Every day | ORAL | Status: DC
  Administered 2024-04-21: 50 mg via ORAL
  Filled 2024-04-20: qty 1

## 2024-04-20 MED ORDER — METOPROLOL TARTRATE 5 MG/5ML IV SOLN
INTRAVENOUS | Status: AC
  Filled 2024-04-20: qty 5

## 2024-04-20 MED ORDER — ONDANSETRON HCL 4 MG/2ML IJ SOLN: 4.0000 mg | Freq: Four times a day (QID) | INTRAMUSCULAR | Status: DC | PRN

## 2024-04-20 MED ORDER — APIXABAN 5 MG PO TABS
5.0000 mg | ORAL_TABLET | Freq: Two times a day (BID) | ORAL | Status: DC
  Administered 2024-04-20 – 2024-04-21 (×2): 5 mg via ORAL
  Filled 2024-04-20 (×2): qty 1

## 2024-04-20 MED ORDER — LABETALOL HCL 5 MG/ML IV SOLN
10.0000 mg | INTRAVENOUS | Status: AC | PRN
Start: 2024-04-20 — End: 2024-04-20

## 2024-04-20 MED ORDER — ACETAMINOPHEN 325 MG PO TABS
650.0000 mg | ORAL_TABLET | ORAL | Status: DC | PRN
  Administered 2024-04-20 – 2024-04-21 (×3): 650 mg via ORAL
  Filled 2024-04-20 (×3): qty 2

## 2024-04-20 MED ORDER — SODIUM CHLORIDE 0.9% FLUSH: 3.0000 mL | INTRAVENOUS | Status: DC | PRN

## 2024-04-20 MED ORDER — SODIUM CHLORIDE 0.9 % IV SOLN: 250.0000 mL | INTRAVENOUS | Status: DC | PRN

## 2024-04-20 MED ORDER — VERAPAMIL HCL 2.5 MG/ML IV SOLN
INTRAVENOUS | Status: DC | PRN
  Administered 2024-04-20: 10 mL via INTRA_ARTERIAL

## 2024-04-20 MED ORDER — BISMUTH SUBSALICYLATE 262 MG/15ML PO SUSP: 30.0000 mL | Freq: Four times a day (QID) | ORAL | Status: DC | PRN

## 2024-04-20 MED ORDER — FENTANYL CITRATE (PF) 100 MCG/2ML IJ SOLN
INTRAMUSCULAR | Status: AC
Start: 2024-04-20 — End: 2024-04-20
  Filled 2024-04-20: qty 2

## 2024-04-20 MED ORDER — CLOPIDOGREL BISULFATE 75 MG PO TABS
75.0000 mg | ORAL_TABLET | Freq: Every day | ORAL | Status: DC
  Administered 2024-04-21: 75 mg via ORAL
  Filled 2024-04-20: qty 1

## 2024-04-20 MED ORDER — NITROGLYCERIN 0.4 MG SL SUBL: 0.4000 mg | SUBLINGUAL_TABLET | SUBLINGUAL | Status: DC | PRN

## 2024-04-20 MED ORDER — CARVEDILOL 12.5 MG PO TABS
12.5000 mg | ORAL_TABLET | Freq: Two times a day (BID) | ORAL | Status: DC
  Administered 2024-04-20 – 2024-04-21 (×2): 12.5 mg via ORAL
  Filled 2024-04-20 (×2): qty 1

## 2024-04-20 MED ORDER — CLOPIDOGREL BISULFATE 300 MG PO TABS
ORAL_TABLET | ORAL | Status: DC | PRN
  Administered 2024-04-20: 600 mg via ORAL

## 2024-04-20 MED ORDER — HEPARIN (PORCINE) IN NACL 1000-0.9 UT/500ML-% IV SOLN
INTRAVENOUS | Status: DC | PRN
  Administered 2024-04-20 (×2): 500 mL via INTRA_ARTERIAL

## 2024-04-20 MED ORDER — SODIUM CHLORIDE 0.9% FLUSH: 3.0000 mL | Freq: Two times a day (BID) | INTRAVENOUS | Status: DC

## 2024-04-20 MED ORDER — MIDAZOLAM HCL 2 MG/2ML IJ SOLN
INTRAMUSCULAR | Status: DC | PRN
  Administered 2024-04-20: .5 mg via INTRAVENOUS
  Administered 2024-04-20 (×3): 1 mg via INTRAVENOUS
  Administered 2024-04-20: .5 mg via INTRAVENOUS

## 2024-04-20 MED ORDER — ASPIRIN 81 MG PO CHEW: 81.0000 mg | CHEWABLE_TABLET | Freq: Once | ORAL | Status: DC

## 2024-04-20 MED ORDER — HEPARIN SODIUM (PORCINE) 1000 UNIT/ML IJ SOLN
INTRAMUSCULAR | Status: DC | PRN
  Administered 2024-04-20: 3000 [IU] via INTRAVENOUS
  Administered 2024-04-20: 4500 [IU] via INTRAVENOUS
  Administered 2024-04-20: 5000 [IU] via INTRAVENOUS

## 2024-04-20 MED ORDER — SODIUM CHLORIDE 0.9% FLUSH
3.0000 mL | Freq: Two times a day (BID) | INTRAVENOUS | Status: DC
  Administered 2024-04-20 – 2024-04-21 (×2): 3 mL via INTRAVENOUS

## 2024-04-20 MED ORDER — VERAPAMIL HCL 2.5 MG/ML IV SOLN
INTRAVENOUS | Status: AC
Start: 2024-04-20 — End: 2024-04-20
  Filled 2024-04-20: qty 2

## 2024-04-20 MED ORDER — MIDAZOLAM HCL 2 MG/2ML IJ SOLN
INTRAMUSCULAR | Status: AC
  Filled 2024-04-20: qty 2

## 2024-04-20 MED ORDER — HYDRALAZINE HCL 20 MG/ML IJ SOLN
INTRAMUSCULAR | Status: DC | PRN
  Administered 2024-04-20: 10 mg via INTRAVENOUS

## 2024-04-20 MED ORDER — HYDRALAZINE HCL 20 MG/ML IJ SOLN
10.0000 mg | INTRAMUSCULAR | Status: AC | PRN
Start: 2024-04-20 — End: 2024-04-20

## 2024-04-20 MED ORDER — HYDRALAZINE HCL 20 MG/ML IJ SOLN
INTRAMUSCULAR | Status: AC
  Filled 2024-04-20: qty 1

## 2024-04-20 MED ORDER — MIDAZOLAM HCL 2 MG/2ML IJ SOLN
INTRAMUSCULAR | Status: AC
Start: 2024-04-20 — End: 2024-04-20
  Filled 2024-04-20: qty 2

## 2024-04-20 MED ORDER — SODIUM CHLORIDE 0.9 % WEIGHT BASED INFUSION
3.0000 mL/kg/h | INTRAVENOUS | Status: DC
  Administered 2024-04-20: 3 mL/kg/h via INTRAVENOUS

## 2024-04-20 SURGICAL SUPPLY — 23 items
BALLOON EMERGE MR 3.0X12 (BALLOONS) IMPLANT
BALLOON SAPPHIRE NC24 3.25X15 (BALLOONS) IMPLANT
BALLOON SAPPHIRE NC24 3.25X8 (BALLOONS) IMPLANT
CATH INFINITI AMBI 5FR TG (CATHETERS) IMPLANT
CATH LAUNCHER 5F 3DRIGHT (CATHETERS) IMPLANT
CATH LAUNCHER 5F ALR12 (CATHETERS) IMPLANT
CATH LAUNCHER 5F AR1 (CATHETERS) IMPLANT
CATH LAUNCHER 5F IMA (CATHETERS) IMPLANT
CATH LAUNCHER 5F JR4 (CATHETERS) IMPLANT
CATH LAUNCHER 5F RADR (CATHETERS) IMPLANT
DEVICE RAD COMP TR BAND LRG (VASCULAR PRODUCTS) IMPLANT
ELECT DEFIB PAD ADLT CADENCE (PAD) IMPLANT
GLIDESHEATH SLEND SS 6F .021 (SHEATH) IMPLANT
GUIDEWIRE INQWIRE 1.5J.035X260 (WIRE) IMPLANT
KIT ENCORE 26 ADVANTAGE (KITS) IMPLANT
KIT SINGLE USE MANIFOLD (KITS) IMPLANT
PACK CARDIAC CATHETERIZATION (CUSTOM PROCEDURE TRAY) ×1 IMPLANT
SET ATX-X65L (MISCELLANEOUS) IMPLANT
SHEATH PROBE COVER 6X72 (BAG) IMPLANT
STENT SYNERGY XD 3.0X16 (Permanent Stent) IMPLANT
STENT SYNERGY XD 3.0X20 (Permanent Stent) IMPLANT
WIRE ASAHI PROWATER 180CM (WIRE) IMPLANT
WIRE RUNTHROUGH .014X180CM (WIRE) IMPLANT

## 2024-04-20 NOTE — Plan of Care (Signed)
  Problem: Education: Goal: Understanding of CV disease, CV risk reduction, and recovery process will improve Outcome: Progressing   Problem: Activity: Goal: Ability to return to baseline activity level will improve Outcome: Progressing   Problem: Cardiovascular: Goal: Ability to achieve and maintain adequate cardiovascular perfusion will improve Outcome: Progressing Goal: Vascular access site(s) Level 0-1 will be maintained Outcome: Progressing   Problem: Health Behavior/Discharge Planning: Goal: Ability to safely manage health-related needs after discharge will improve Outcome: Progressing   Problem: Education: Goal: Knowledge of General Education information will improve Description: Including pain rating scale, medication(s)/side effects and non-pharmacologic comfort measures Outcome: Progressing   Problem: Health Behavior/Discharge Planning: Goal: Ability to manage health-related needs will improve Outcome: Progressing   Problem: Clinical Measurements: Goal: Ability to maintain clinical measurements within normal limits will improve Outcome: Progressing Goal: Will remain free from infection Outcome: Progressing Goal: Diagnostic test results will improve Outcome: Progressing Goal: Respiratory complications will improve Outcome: Progressing Goal: Cardiovascular complication will be avoided Outcome: Progressing   Problem: Coping: Goal: Level of anxiety will decrease Outcome: Progressing   Problem: Pain Managment: Goal: General experience of comfort will improve and/or be controlled Outcome: Progressing   Problem: Skin Integrity: Goal: Risk for impaired skin integrity will decrease Outcome: Progressing

## 2024-04-20 NOTE — Brief Op Note (Signed)
 BRIEF CARDIAC CATHETERIZATION - PERCUTANEOUS CORONARY INTERVENTION NOTE   04/20/2024 12:25 PM  ID:  [name], DOB 03-May-1941, MRN [id number]   PCP/Referring Provider: Katrinka Garnette KIDD, MD  [facility] Providers Cardiologist:  Alm Clay, MD Electrophysiologist:  Jerel Balding, MD    PROCEDURE:  Procedure(s): LEFT HEART CATH AND CORONARY ANGIOGRAPHY (N/A) CORONARY STENT INTERVENTION (N/A)  SURGEON:  Surgeons and Role:    * Clay Alm ORN, MD - Primary  PATIENT:  [name]  83 y.o. female referred for cardiac catheterization due to abnormal findings on cardiac stress PET.  She has a history of RCA and LAD stents and chronic chest pain but now presenting with symptoms more concerning for angina with exertional dyspnea and chest tightness.  She is also had PAF with PPM.  She has HLD for which she does not take and will not take any cholesterol medications.  Based on stress test she is referred for cardiac catheterization with possible PCI and if not indicated, would consider CFR.  PRE-OPERATIVE DIAGNOSIS: Coronary disease with angina - abnormal stress test  POST-OPERATIVE DIAGNOSIS:   Two-vessel CAD involving the RCA and LAD Widely patent mid LAD stents from D1 beyond D2 with no in-stent restenosis.  The distal LAD is extremely tortuous.  The LCx system gives off small OM1 and major OM 2 after giving off a small to moderate LPAV-LPL system with 2 small LPL's. RCA has ostial to proximal 60 to 70% stenosis (worse after distal stent placement), widely patent proximal bare-metal stent with a segmental tapering to focal 80% stenosis downstream of previous stent.  The RCA has a high bifurcation into the large PAV-PL 1 system and PDA. Successful 2 site PCI of the RCA with a proximal and distal overlapping stent to the prior BMS: 3.0 mm x 20 mm Synergy XD DES overlapping distally covering the 80% stenosis (deployed to 3.3 mm); 3.0 mm x 16 mm Synergy XD DES overlapping the  BMS proximally to the ostium deployed to 3.3 mm postdilated the ostium to 3.4 mm. LVEDP estimated 20 to 22 mmHg.  ----------------------------  PROCEDURE PERFORMED Time Out: Verified patient identification, verified procedure, site/side was marked, verified correct patient position, special equipment/implants available, medications/allergies/relevent history reviewed, required imaging and test results available. Performed.  Access:  RIGHT radial Artery: 6 Fr sheath -- Seldinger technique using Micropuncture Kit -- Direct ultrasound guidance used.  Permanent image obtained and placed on chart. -- 10 mL radial cocktail IA; 4500 units IV Heparin   Left Heart Catheterization: 5 Fr Catheters advanced or exchanged over a J-wire under direct fluoroscopic guidance into the ascending aorta; TIG 4.0 catheter advanced first.   * LV Hemodynamics (LV Gram),  Left & Right Coronary Artery Cineangiography: TIG 4.0 catheter   Review of initial angiography revealed: Likely culprit lesion in the mid RCA distal to the previous stent as well as proximal to the stent.  Preparations are made for DES PCI of the RCA and 2 sites - Additional boluses of IV heparin  administered (5000 and 3000 units) given to achieve and maintain an ACT> 250 seconds Multiple catheters were used to try to engage the RCA including JR4, AR 4, 3 DRC, EZ Rad Right-finally we were able to engage with a LIMA guide catheter.  A [place] wire used for workhorse with the BMW used for buddy wire.  Predilation of the mid RCA lesion with 3.0 mm x 12 mm balloon several inflations to 10 atm for 20 seconds; => 3.0 mm x  20 mm Synergy XD placed overlapping the previous stent distally and covering the 80% lesion-stent was deployed to 3.3 mm.  (Unable to advance [place] balloon distally) => with post PCI imaging it became clear that the more ostial proximal lesion was more significant likely related to catheter and balloon related injury.  I decided to stent this  area with a 3.0 mm x 16 mm Synergy XD DES overlapping the previous stent proximally and covering the ostium.  The stent was deployed to 3.3 mm and postdilated in the overlap and ostium to 3.4 mm. => TIMI-3 flow maintained with lesions reduced to 0%.  Upon completion of Angiogaphy, the catheter was removed completely out of the body over a wire, without complication.  Radial sheath removed in the Cardiac Catheterization lab with TR Band placed for hemostasis.  TR Band: 1220 Hours; 18 mL air  MEDICATIONS SQ Lidocaine  3 mL Radial Cocktail: 3 mg Verapmil in 10 mL NS Heparin : Total of 12,500 units Oral Plavix  600 mg x 1 4 mg Zofran    ANESTHESIA:   local and IV sedation; SQ lidocaine  as noted; IV fentanyl  and Versed  used for sedation;  EBL:  <50 mL  COUNTS:  YES  TOURNIQUET:  * No tourniquets in log *  PATIENT DISPOSITION:  PACU - hemodynamically stable.  DICTATION: .Note written in EPIC  PLAN OF CARE: Admit for overnight observation outpatient with extended stay.  This will allow for IV hydration. - Restart Eliquis  at 2200 p.m. 04/20/2024 - Plan will be for 2 weeks of triple therapy with aspirin , Plavix  and Eliquis  => stop aspirin  after 2 weeks and continue Plavix  plus Eliquis  x 6 months prior to stopping Plavix . - After long discussion, we will continue to refrain from treating lipids. - Continue other home medications.    Delay start of Pharmacological VTE agent (>24hrs) due to surgical blood loss or risk of bleeding: not applicable.     Alm Clay, MD

## 2024-04-20 NOTE — Interval H&P Note (Signed)
 History and Physical Interval Note:  04/20/2024 10:11 AM  [name]  has presented today for surgery, with the diagnosis of angina - abnormal stress test.  The various methods of treatment have been discussed with the patient and family. After consideration of risks, benefits and other options for treatment, the patient has consented to  Procedure(s): LEFT HEART CATH AND CORONARY ANGIOGRAPHY (N/A)  PERCUTANEOUS CORONARY INTERVENTION  as a surgical intervention.  The patient's history has been reviewed, patient examined, no change in status, stable for surgery.  I have reviewed the patient's chart and labs.  Questions were answered to the patient's satisfaction.    Cath Lab Visit (complete for each Cath Lab visit)  Clinical Evaluation Leading to the Procedure:   ACS: No.  Non-ACS:    Anginal Classification: CCS III  Anti-ischemic medical therapy: Maximal Therapy (2 or more classes of medications)  Non-Invasive Test Results: Equivocal test results  Prior CABG: No previous CABG    Alm Clay

## 2024-04-21 ENCOUNTER — Other Ambulatory Visit (HOSPITAL_COMMUNITY): Payer: Self-pay

## 2024-04-21 ENCOUNTER — Encounter (HOSPITAL_COMMUNITY): Payer: Self-pay | Admitting: Cardiology

## 2024-04-21 ENCOUNTER — Ambulatory Visit: Admitting: Urology

## 2024-04-21 DIAGNOSIS — I25119 Atherosclerotic heart disease of native coronary artery with unspecified angina pectoris: Secondary | ICD-10-CM | POA: Diagnosis not present

## 2024-04-21 DIAGNOSIS — I4719 Other supraventricular tachycardia: Secondary | ICD-10-CM | POA: Diagnosis not present

## 2024-04-21 DIAGNOSIS — Z950 Presence of cardiac pacemaker: Secondary | ICD-10-CM | POA: Diagnosis not present

## 2024-04-21 DIAGNOSIS — Z9861 Coronary angioplasty status: Secondary | ICD-10-CM

## 2024-04-21 DIAGNOSIS — I87303 Chronic venous hypertension (idiopathic) without complications of bilateral lower extremity: Secondary | ICD-10-CM | POA: Diagnosis not present

## 2024-04-21 DIAGNOSIS — E119 Type 2 diabetes mellitus without complications: Secondary | ICD-10-CM | POA: Diagnosis not present

## 2024-04-21 DIAGNOSIS — I480 Paroxysmal atrial fibrillation: Secondary | ICD-10-CM | POA: Diagnosis not present

## 2024-04-21 DIAGNOSIS — I110 Hypertensive heart disease with heart failure: Secondary | ICD-10-CM | POA: Diagnosis not present

## 2024-04-21 DIAGNOSIS — E785 Hyperlipidemia, unspecified: Secondary | ICD-10-CM | POA: Diagnosis not present

## 2024-04-21 DIAGNOSIS — Z955 Presence of coronary angioplasty implant and graft: Secondary | ICD-10-CM | POA: Diagnosis not present

## 2024-04-21 DIAGNOSIS — Z7902 Long term (current) use of antithrombotics/antiplatelets: Secondary | ICD-10-CM | POA: Diagnosis not present

## 2024-04-21 DIAGNOSIS — I5032 Chronic diastolic (congestive) heart failure: Secondary | ICD-10-CM | POA: Diagnosis not present

## 2024-04-21 DIAGNOSIS — D6869 Other thrombophilia: Secondary | ICD-10-CM | POA: Diagnosis not present

## 2024-04-21 LAB — BASIC METABOLIC PANEL WITH GFR
Anion gap: 8 (ref 5–15)
BUN: 31 mg/dL — ABNORMAL HIGH (ref 8–23)
CO2: 18 mmol/L — ABNORMAL LOW (ref 22–32)
Calcium: 8.9 mg/dL (ref 8.9–10.3)
Chloride: 108 mmol/L (ref 98–111)
Creatinine, Ser: 1.33 mg/dL — ABNORMAL HIGH (ref 0.44–1.00)
GFR, Estimated: 40 mL/min — ABNORMAL LOW (ref >60)
Glucose, Bld: 164 mg/dL — ABNORMAL HIGH (ref 70–99)
Potassium: 4.5 mmol/L (ref 3.5–5.1)
Sodium: 134 mmol/L — ABNORMAL LOW (ref 135–145)

## 2024-04-21 LAB — CBC
HCT: 28.3 % — ABNORMAL LOW (ref 36.0–46.0)
Hemoglobin: 9.5 g/dL — ABNORMAL LOW (ref 12.0–15.0)
MCH: 30.7 pg (ref 26.0–34.0)
MCHC: 33.6 g/dL (ref 30.0–36.0)
MCV: 91.6 fL (ref 80.0–100.0)
Platelets: 255 K/uL (ref 150–400)
RBC: 3.09 MIL/uL — ABNORMAL LOW (ref 3.87–5.11)
RDW: 15.4 % (ref 11.5–15.5)
WBC: 6 K/uL (ref 4.0–10.5)
nRBC: 0 % (ref 0.0–0.2)

## 2024-04-21 MED ORDER — CLOPIDOGREL BISULFATE 75 MG PO TABS
75.0000 mg | ORAL_TABLET | Freq: Every day | ORAL | 1 refills | Status: DC
  Filled 2024-04-21: qty 90, 90d supply, fill #0

## 2024-04-21 MED ORDER — ASPIRIN 81 MG PO TBEC
81.0000 mg | DELAYED_RELEASE_TABLET | Freq: Every day | ORAL | 0 refills | Status: AC
  Filled 2024-04-21: qty 14, 14d supply, fill #0

## 2024-04-21 MED ORDER — NITROGLYCERIN 0.4 MG SL SUBL
SUBLINGUAL_TABLET | SUBLINGUAL | 2 refills | Status: AC
  Filled 2024-04-21: qty 25, 5d supply, fill #0

## 2024-04-21 NOTE — TOC Transition Note (Signed)
 Transition of Care [facility]) - Discharge Note   Patient Details  Name: [name] MRN: [id number] Date of Birth: Oct 14, 1940  [facility] CM/SW Contact:  Andrez JULIANNA George, RN Phone Number: 04/21/2024, 12:05 PM   Clinical Narrative:     Pt is discharging home with self care. No needs per IP care management.   Final next level of care: Home/Self Care Barriers to Discharge: No Barriers Identified   Patient Goals and CMS Choice            Discharge Placement                       Discharge Plan and Services Additional resources added to the After Visit Summary for                                       Social Drivers of Health (SDOH) Interventions SDOH Screenings   Food Insecurity: No Food Insecurity (04/20/2024)  Housing: Low Risk  (04/20/2024)  Transportation Needs: No Transportation Needs (04/20/2024)  Utilities: Not At Risk (04/20/2024)  Depression (PHQ2-9): Low Risk  (04/15/2024)  Financial Resource Strain: Low Risk  (03/10/2023)  Physical Activity: Inactive (03/10/2023)  Social Connections: Moderately Integrated (04/20/2024)  Stress: No Stress Concern Present (03/10/2023)  Tobacco Use: Low Risk  (04/20/2024)     Readmission Risk Interventions     No data to display

## 2024-04-21 NOTE — Discharge Summary (Addendum)
 Discharge Summary   Patient ID: [name] MRN: [id number]; DOB: [birth year]  Admit date: 04/20/2024 Discharge date: 04/21/2024  PCP:  Katrinka Garnette KIDD, MD   [facility] Providers Cardiologist:  Alm Clay, MD  Electrophysiologist:  Jerel Balding, MD     Discharge Diagnoses  Principal Problem:   CAD S/P percutaneous coronary angioplasty: pRCA BMS, mLAD BMS overlapped prox with DES for ISR  Diagnostic Studies/Procedures   Cath: 04/20/2024    Lesion #1: Mid RCA lesion is 80% stenosed.   A drug-eluting stent was successfully placed overlapping the previously placed BMS distally, using a STENT SYNERGY XD 3.0X20 -deployed and then postdilated with stent balloon to 3.25 mm.  Post intervention, there is a 0% residual stenosis. TIMI-3 flow maintained   Previously placed prox RCA to Mid RCA stent is 5% stenosed.  (In-stent restenosis)   Lesion #2: Ost RCA to Prox RCA lesion is 65% stenosed.   A drug-eluting stent was successfully placed overlapping the previously placed BMS proximally using a STENT SYNERGY XD 3.0X16 deployed to 3.3 mm postdilated including the overlap to 3.4 mm. Post intervention, there is a 0% residual stenosis.  TIMI-3 flow maintained   ------------------------------------------------------------------------------   Previously placed Prox LAD to Mid LAD stent segment (overlapping BMS and DES stents) is widely patent.   Dist Cx lesion is 30% stenosed with 30% stenosed side branch in LPAV.   LV end diastolic pressure is moderately elevated.   There is no aortic valve stenosis.   ------------------------------------------------------------------------------     Diagnostic:  Dominance: Co-dominant                                                   Intervention      POST-OPERATIVE DIAGNOSIS:   Two-vessel CAD involving the RCA and LAD Widely patent mid LAD stents from D1 beyond D2 with no in-stent restenosis.  The distal LAD is extremely tortuous.  The LCx  system gives off small OM1 and major OM 2 after giving off a small to moderate LPAV-LPL system with 2 small LPL's. RCA has ostial to proximal 60 to 70% stenosis (worse after distal stent placement), widely patent proximal bare-metal stent with a segmental tapering to focal 80% stenosis downstream of previous stent.  The RCA has a high bifurcation into the large PAV-PL 1 system and PDA. Successful 2 site PCI of the RCA with a proximal and distal overlapping stent to the prior BMS:  3.0 mm x 20 mm Synergy XD DES overlapping distally covering the 80% stenosis (deployed to 3.3 mm);  3.0 mm x 16 mm Synergy XD DES overlapping the BMS proximally to the ostium deployed to 3.3 mm postdilated the ostium to 3.4 mm. LVEDP estimated 20 to 22 mmHg.     PLAN OF CARE:  Admit for Outpatient with Extended Stay.  This will allow for IV hydration.  In the absence of any other complications or medical issues, we expect the patient to be ready for discharge from an interventional cardiology perspective on 04/21/2024.  Gently hydrate overnight and monitoring for worsening dyspnea. May require Lasix . Restart home medications  Restart Eliquis  at 2200 p.m. 04/20/2024 Plan will be for 2 weeks of triple therapy with aspirin , Plavix  and Eliquis  => stop aspirin  after 2 weeks and continue Plavix  plus Eliquis  x 6 months prior to stopping Plavix . After long discussion,  we will continue to refrain from treating lipids.   Alm Clay, MD   _____________   History of Present Illness   [name] is a 83 y.o. female with past medical history of CAD s/p multiple PCIs, chronic HFpEF, celiac artery stenosis s/p stent, bilateral renal artery stenosis s/p left RA stent, paroxysmal atrial fibrillation, symptomatic bradycardia status post PPM, hyperlipidemia who was recently seen in the office for follow-up and noted increased episodes of exertional dyspnea with minimal exertion associated with chest discomfort and palpitations.   Decision was made to proceed with cardiac stress PET which was read at intermediate risk.  Decision ultimately made to send for outpatient cardiac catheterization for definitive evaluation.   Hospital Course    Underwent outpatient cardiac catheterization 8/19 with Dr. Clay noted above with two-vessel CAD including the RCA and LAD with widely patent mid LAD stents.  Ostial RCA with 60 to 70% stenosis as well as 80% stenosis downstream of previous stent both sites treated with PCI/DES x 1 overlapping prior BMS.  Recommendations for triple therapy with aspirin /Plavix  and Eliquis  for 2 weeks then discontinuation of aspirin  with continuation of Plavix  and Eliquis  for 6 months prior to stopping Plavix .  She continues to refused lipid-lowering treatments.  No complications noted overnight.  Seen by cardiac rehab.  Did the patient have an acute coronary syndrome (MI, NSTEMI, STEMI, etc) this admission?:  No                               Did the patient have a percutaneous coronary intervention (stent / angioplasty)?:  Yes.     Cath/PCI Registry Performance & Quality Measures: Aspirin  prescribed? - Yes ADP Receptor Inhibitor (Plavix /Clopidogrel , Brilinta/Ticagrelor or Effient/Prasugrel) prescribed (includes medically managed patients)? - Yes High Intensity Statin (Lipitor 40-80mg  or Crestor 20-40mg ) prescribed? - No - refuses For EF <40%, was ACEI/ARB prescribed? - Not Applicable (EF >/= 40%) For EF <40%, Aldosterone Antagonist (Spironolactone  or Eplerenone) prescribed? - Not Applicable (EF >/= 40%) Cardiac Rehab Phase II ordered? - Yes   The patient will be scheduled for a TOC follow up appointment in 10-14 days.  A message has been sent to the [facility] and Scheduling Pool at the office where the patient should be seen for follow up.  _____________  Discharge Vitals Blood pressure (!) 136/40, pulse 94, temperature 97.9 F (36.6 C), temperature source Oral, resp. rate 18, height 5' 8 (1.727 m),  weight 90.7 kg, SpO2 97%.  Filed Weights   04/20/24 0712 04/20/24 1451  Weight: 87.5 kg 90.7 kg    Labs & Radiologic Studies  CBC Recent Labs    04/21/24 0429  WBC 6.0  HGB 9.5*  HCT 28.3*  MCV 91.6  PLT 255   Basic Metabolic Panel Recent Labs    91/79/74 0429  NA 134*  K 4.5  CL 108  CO2 18*  GLUCOSE 164*  BUN 31*  CREATININE 1.33*  CALCIUM 8.9   Liver Function Tests No results for input(s): AST, ALT, ALKPHOS, BILITOT, PROT, ALBUMIN in the last 72 hours. No results for input(s): LIPASE, AMYLASE in the last 72 hours. High Sensitivity Troponin:   No results for input(s): TROPONINIHS in the last 720 hours.  No results for input(s): TRNPT in the last 720 hours.  BNP Invalid input(s): POCBNP No results for input(s): PROBNP in the last 72 hours.  No results for input(s): BNP in the last 72 hours.  D-Dimer  No results for input(s): DDIMER in the last 72 hours. Hemoglobin A1C No results for input(s): HGBA1C in the last 72 hours. Fasting Lipid Panel No results for input(s): CHOL, HDL, LDLCALC, TRIG, CHOLHDL, LDLDIRECT in the last 72 hours. No results found for: LIPOA  Thyroid  Function Tests No results for input(s): TSH, T4TOTAL, T3FREE, THYROIDAB in the last 72 hours.  Invalid input(s): FREET3 _____________  CARDIAC CATHETERIZATION Result Date: 04/20/2024 Table formatting from the original result was not included. Images from the original result were not included.   Lesion #1: Mid RCA lesion is 80% stenosed.   A drug-eluting stent was successfully placed overlapping the previously placed BMS distally, using a STENT SYNERGY XD 3.0X20 -deployed and then postdilated with stent balloon to 3.25 mm.  Post intervention, there is a 0% residual stenosis. TIMI-3 flow maintained   Previously placed prox RCA to Mid RCA stent is 5% stenosed.  (In-stent restenosis)   Lesion #2: Ost RCA to Prox RCA lesion is 65% stenosed.   A  drug-eluting stent was successfully placed overlapping the previously placed BMS proximally using a STENT SYNERGY XD 3.0X16 deployed to 3.3 mm postdilated including the overlap to 3.4 mm. Post intervention, there is a 0% residual stenosis.  TIMI-3 flow maintained   --------------------------------------------------------------------------- ---   Previously placed Prox LAD to Mid LAD stent segment (overlapping BMS and DES stents) is widely patent.   Dist Cx lesion is 30% stenosed with 30% stenosed side branch in LPAV.   LV end diastolic pressure is moderately elevated.   There is no aortic valve stenosis.   --------------------------------------------------------------------------- --- Diagnostic:  Dominance: Co-dominant                                                   Intervention  POST-OPERATIVE DIAGNOSIS:  Two-vessel CAD involving the RCA and LAD Widely patent mid LAD stents from D1 beyond D2 with no in-stent restenosis.  The distal LAD is extremely tortuous.  The LCx system gives off small OM1 and major OM 2 after giving off a small to moderate LPAV-LPL system with 2 small LPL's. RCA has ostial to proximal 60 to 70% stenosis (worse after distal stent placement), widely patent proximal bare-metal stent with a segmental tapering to focal 80% stenosis downstream of previous stent.  The RCA has a high bifurcation into the large PAV-PL 1 system and PDA. Successful 2 site PCI of the RCA with a proximal and distal overlapping stent to the prior BMS: 3.0 mm x 20 mm Synergy XD DES overlapping distally covering the 80% stenosis (deployed to 3.3 mm); 3.0 mm x 16 mm Synergy XD DES overlapping the BMS proximally to the ostium deployed to 3.3 mm postdilated the ostium to 3.4 mm. LVEDP estimated 20 to 22 mmHg. PLAN OF CARE: Admit for Outpatient with Extended Stay.  This will allow for IV hydration.  In the absence of any other complications or medical issues, we expect the patient to be ready for discharge from an  interventional cardiology perspective on 04/21/2024. Gently hydrate overnight and monitoring for worsening dyspnea. May require Lasix . Restart home medications Restart Eliquis  at 2200 p.m. 04/20/2024 Plan will be for 2 weeks of triple therapy with aspirin , Plavix  and Eliquis  => stop aspirin  after 2 weeks and continue Plavix  plus Eliquis  x 6 months prior to stopping Plavix . After long discussion, we will continue  to refrain from treating lipids. Alm Clay, MD    NM PET CT CARDIAC PERFUSION MULTI W/ABSOLUTE BLOODFLOW Result Date: 03/23/2024   Findings are consistent with ischemia. The study is intermediate risk not due to the small areas of epicardial perfusion defect but because of the decrease in blood flow reserve (that can also be seen in microvascular disease).   LV perfusion is abnormal. There is evidence of ischemia. Defect 1: There is a small defect with moderate reduction in uptake present in the basal septal and apex location(s) that is reversible. There is normal wall motion in the defect area. Consistent with ischemia.   Rest left ventricular function is normal. Rest EF: 60%. Stress left ventricular function is normal. Stress EF: 69%. End diastolic cavity size is normal. End systolic cavity size is normal.   Myocardial blood flow was computed to be 1.29ml/g/min at rest and 1.75ml/g/min at stress. Global myocardial blood flow reserve was 1.64 and was abnormal.   Coronary calcium assessment not performed due to prior revascularization.  Aortic atherosclerosis noted.  Two Device leads are incompletely imaged to terimination into the right atrial appendage and the right ventricular apex.   Electronically Signed  By: Stanly Leavens M.D. EXAM: OVER-READ INTERPRETATION CARDIAC CT CHEST The following report is an over-read performed by radiologist Dr. Ryan Salvage of [facility] on 03/23/2024. This over-read does not include interpretation of cardiac or coronary anatomy or pathology.  The cardiac and PET interpretation by the cardiologist is attached or will be attached. COMPARISON:  PET-CT 03/10/2024 FINDINGS: Extracardiac Vascular: Dense atheromatous vascular calcification of the thoracic aorta. Mediastinum: Small type 1 hiatal hernia. Lung: Mild dependent atelectasis in both lower lobes. Included Upper Abdomen: Pneumobilia. Left adrenal adenoma measuring 1.4 by 1.9 cm on image 77 series 3. No further imaging workup of this lesion is indicated. Musculoskeletal: Lower thoracic posterolateral rod and pedicle screw fixation. IMPRESSION: 1. Small type 1 hiatal hernia. 2. Mild dependent atelectasis in both lower lobes. 3. Left adrenal adenoma. No further imaging workup of this lesion is indicated. 4. Pneumobilia. 5. Lower thoracic posterolateral rod and pedicle screw fixation. 6.  Aortic Atherosclerosis (ICD10-I70.0). Electronically Signed   By: Ryan Salvage M.D.   On: 03/23/2024 11:20   Disposition Pt is being discharged home today in good condition.  Follow-up Plans & Appointments  Discharge Instructions     Amb Referral to Cardiac Rehabilitation   Complete by: As directed    Diagnosis:  Coronary Stents PTCA     After initial evaluation and assessments completed: Virtual Based Care may be provided alone or in conjunction with Phase 2 Cardiac Rehab based on patient barriers.: Yes   Intensive Cardiac Rehabilitation (ICR) [facility] location only OR Traditional Cardiac Rehabilitation (TCR) *If criteria for ICR are not met will enroll in TCR [facility] only): Yes   Call MD for:  difficulty breathing, headache or visual disturbances   Complete by: As directed    Call MD for:  redness, tenderness, or signs of infection (pain, swelling, redness, odor or green/yellow discharge around incision site)   Complete by: As directed    Diet - low sodium heart healthy   Complete by: As directed    Discharge instructions   Complete by: As directed    Radial Site Care Refer to this sheet in the next  few weeks. These instructions provide you with information on caring for yourself after your procedure. Your caregiver may also give you more specific instructions. Your treatment has been planned according  to current medical practices, but problems sometimes occur. Call your caregiver if you have any problems or questions after your procedure. HOME CARE INSTRUCTIONS You may shower the day after the procedure. Remove the bandage (dressing) and gently wash the site with plain soap and water. Gently pat the site dry.  Do not apply powder or lotion to the site.  Do not submerge the affected site in water for 3 to 5 days.  Inspect the site at least twice daily.  Do not flex or bend the affected arm for 24 hours.  No lifting over 5 pounds (2.3 kg) for 5 days after your procedure.  Do not drive home if you are discharged the same day of the procedure. Have someone else drive you.  You may drive 24 hours after the procedure unless otherwise instructed by your caregiver.  What to expect: Any bruising will usually fade within 1 to 2 weeks.  Blood that collects in the tissue (hematoma) may be painful to the touch. It should usually decrease in size and tenderness within 1 to 2 weeks.  SEEK IMMEDIATE MEDICAL CARE IF: You have unusual pain at the radial site.  You have redness, warmth, swelling, or pain at the radial site.  You have drainage (other than a small amount of blood on the dressing).  You have chills.  You have a fever or persistent symptoms for more than 72 hours.  You have a fever and your symptoms suddenly get worse.  Your arm becomes pale, cool, tingly, or numb.  You have heavy bleeding from the site. Hold pressure on the site.   Increase activity slowly   Complete by: As directed        Discharge Medications Allergies as of 04/21/2024       Reactions   Codeine Phosphate Anaphylaxis, Shortness Of Breath, Swelling   Contrast Media [iodinated Contrast Media] Swelling   Swelling of  the face. Reaction was to ionic contrast many years ago. Patient has had non-ionic contrast many time without pre medication and has had no reaction.    Statins Other (See Comments)   Hyperactivity   Tramadol Palpitations, Other (See Comments)   Hyperactivity        Medication List     TAKE these medications    alendronate  70 MG tablet Commonly known as: FOSAMAX  TAKE 1 TABLET (70 MG TOTAL) BY MOUTH EVERY 7 DAYS WITH FULL GLASS WATER ON EMPTY STOMACH   allopurinol  100 MG tablet Commonly known as: ZYLOPRIM  TAKE 1 TABLET BY MOUTH EVERY DAY   amiodarone  200 MG tablet Commonly known as: PACERONE  TAKE 400 MG (2 TABLETS) ONE DOSE AS NEED FOR A FAST HEART RATE ,IF AFTER 12 HOURS STILL PRESENT TAKE 200 MG ( 1 TABLET) ONE DOSE FOR EACH EPISODE   apixaban  5 MG Tabs tablet Commonly known as: ELIQUIS  Take 1 tablet (5 mg total) by mouth 2 (two) times daily.   aspirin  EC 81 MG tablet Take 1 tablet (81 mg total) by mouth daily for 14 days. Swallow whole.   bismuth  subsalicylate 262 MG/15ML suspension Commonly known as: PEPTO BISMOL Take 30 mLs by mouth every 6 (six) hours as needed for diarrhea or loose stools.   carvedilol  12.5 MG tablet Commonly known as: COREG  Take 1 tablet (12.5 mg total) by mouth 2 (two) times daily. May take an additional 12.5 mg if needed for extra palp.   clopidogrel  75 MG tablet Commonly known as: PLAVIX  Take 1 tablet (75 mg total) by mouth daily  with breakfast. Start taking on: April 22, 2024   colchicine 0.6 MG tablet Take 0.6 mg by mouth daily as needed (Gout).   cyanocobalamin  1000 MCG tablet Commonly known as: VITAMIN B12 Take 1,000 mcg by mouth daily.   diclofenac sodium 1 % Gel Commonly known as: VOLTAREN Apply 2 g topically 4 (four) times daily as needed (pain).   furosemide  80 MG tablet Commonly known as: LASIX  TAKE 1 TABLET(80 MG) BY MOUTH ONCE A DAY , MAY TAKE AN ADDITIONAL 1 TABLET IF NEEDED FOR SWELLING   hyoscyamine  0.125 MG Tbdp  disintergrating tablet Commonly known as: ANASPAZ  Place 1 tablet (0.125 mg total) under the tongue every 6 (six) hours as needed (abdominal cramping).   indapamide  2.5 MG tablet Commonly known as: LOZOL  Take 2.5 mg by mouth daily.   isosorbide  mononitrate 60 MG 24 hr tablet Commonly known as: IMDUR  Take 1 tablet (60 mg total) by mouth daily. May take an extra dose if you use Nitroglycerin   sublingual tablet for 2 days afterwards   lidocaine  5 % ointment Commonly known as: XYLOCAINE  Apply 1 application. topically as needed.   losartan  50 MG tablet Commonly known as: COZAAR  TAKE 1 TABLET BY MOUTH EVERY DAY   meclizine  12.5 MG tablet Commonly known as: ANTIVERT  Take 1 tablet (12.5 mg total) by mouth 2 (two) times daily as needed for dizziness.   nitroGLYCERIN  0.4 MG SL tablet Commonly known as: NITROSTAT  DISSOLVE 1 TABLET UNDER THE TONGUE EVERY 5 MINUTES AS NEEDED FOR CHEST PAIN   nystatin  powder Commonly known as: MYCOSTATIN /NYSTOP  Apply 1 Application topically 3 (three) times daily. What changed:  when to take this reasons to take this   solifenacin  10 MG tablet Commonly known as: VESICARE  Take 1 tablet (10 mg total) by mouth daily.   spironolactone  25 MG tablet Commonly known as: ALDACTONE  TAKE 1 TABLET BY MOUTH AS DIRECTED. TAKE 1 TABLET ON MONDAYS, WEDNESDAYS, AND FRIDAYS AT 6 PM.   TEARS RENEWED OP Apply 1 drop to eye daily as needed (dry eyes).        Outstanding Labs/Studies  N/a   Duration of Discharge Encounter: APP Time: 15 minutes   Signed, Manuelita Rummer, NP 04/21/2024, 11:45 AM   Agree with note by Manuelita Rummer NP-C  Patient of Dr. Genice with known CAD, abnormal cardiac PET study status post proximal and mid RCA PCI and drug-eluting stenting with an excellent result.  Patient is asymptomatic.  Her exam is benign.  She is deemed stable for discharge.  She is on DAPT.  Will arrange outpatient follow-up.  Dorn DOROTHA Lesches, M.D., FACP,  [facility], LYNITA [facility] [address] [place], [place]  [address]  [phone] 04/21/2024 12:47 PM

## 2024-04-21 NOTE — Progress Notes (Signed)
 Pt just recently ambulated on her own. She c/o increased SOB with activity and walking. She does st that she had a 6 lb weight increase recently. Instructed not to take diuretic for 2 days. Encouraged pt to weigh daily on d/c and communicate with MD if no decrease in weight.  Discussed with pt restrictions, stents, Plavix , diet exercise, NTG and CRPII. Pt receptive. Voices she needs a new rx for NTG. Will refer to [facility] CRPII. Her husband was referred to [facility] a few weeks ago, she would like him to come with her to [facility]. [id number] Aliene Aris BS, ACSM-CEP 04/21/2024 11:29 AM

## 2024-04-21 NOTE — Progress Notes (Deleted)
 Assessment: 1. Urge incontinence   2. Gross hematuria - negative evaluation 4/25   3. Nephrolithiasis     Plan: No further episodes of gross hematuria.  Prior negative evaluation. Diagnosis and management of OAB/urge incontinence discussed with the patient.  Options for management including avoidance of dietary irritants, behavioral modification, medical therapy, neuromodulation, and chemodenervation discussed. Trial of solifenacin  10 mg daily.  Patient instructed to start with a 5 mg dose and increase if needed for symptom management. Use and side effects discussed. Return to office in 2 months.  Chief Complaint:  No chief complaint on file.   History of Present Illness:  [name] is a 83 y.o. female who is seen for further evaluation of gross hematuria and left flank pain. She has a history of nephrolithiasis and was previously followed at [facility] health urology in [facility].  Her last visit was in November 2021.  At that time, she was found to have a right renal calculus. She recently had acute onset of left-sided flank pain and gross hematuria.  She thinks she may have passed a stone.  The hematuria resolved after 1 void.  She continued to have some left-sided flank and back pain, worse with movement and certain positions.  No dysuria.  She reported baseline symptoms of frequency, urgency, nocturia, and incontinence.  Urinalysis from 11/13/2023 showed 3-6 WBCs, 0-2 RBCs, and rare bacteria. Urine culture showed no growth. CT abdomen and pelvis with contrast from 11/08/2023 showed a nonobstructing right renal calculus. Cystoscopy from 4/25 showed no urethral or bladder abnormalities.  Urine cytology was negative for malignancy or dysplasia.  At her visit in June 2025, she reported no further episodes of gross hematuria.  No dysuria.  She reported continued left-sided flank pain increased with movement.  She continued to have symptoms of frequency, urgency, nocturia, and urge  incontinence.  She was requiring pads daily for her incontinence.  She had not tried any medical therapy to date. She was given a trial of solifenacin  10 mg daily.  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  Portions of the above documentation were copied from a prior visit for review purposes only.   Past Medical History:  Past Medical History:  Diagnosis Date   Adrenal adenoma    Arthritis    fingers, right shoulder (09/09/2017)   Atrial fibrillation (HCC) 12/2021   New diagnosis noted on pacemaker interrogation.   Back pain with radiation    Chronic Back Pain - mutliple surgeries (including tumor removal)   Bilateral edema of lower extremity    Chronic, likely related to venous stasis   Bradycardia    Pacemaker placed   CAD S/P percutaneous coronary angioplasty    a) LHC: 07/30/10. -- 3.0x63mm Integrity BMS to pRCA & 2.5x 15mm Integrity BMS mLAD(@ D2).  b) Class III-IV Angina 03/2011: LHC- ISR in LAD BMS -- prox overlapping Promus DES 2.5x85mm and PTCA of jailed D2 ostium-prox 80%. c) 02/03/12:  LHC- patent stents.  Jailed diagonal. with stable flow; d) Peri-OP NSTEMI 04/2012 - LHC in 12/'13 -    Celiac artery stenosis (HCC)    12/2016 - STENT placement   Complication of anesthesia    used to wake up wild years ago (09/09/2017)   Congestive heart failure with LV diastolic dysfunction, NYHA class 2 (HCC)    06/13/10:  last 2D echo-  EF >55%, Mild TR, Mod Conc LVH - Grade 1 diastolic dysfunction (  abnormal relaxation) --> LVEDP on Cath 28 mmHg & mild 2nd Pulm HTN   Diet-controlled type 2 diabetes mellitus (HCC)    Diverticulitis of colon (without mention of hemorrhage)(562.11)    Diverticulosis    Dyslipidemia, goal LDL below 70    Intolerant to statins   GERD (gastroesophageal reflux disease)    Hepatitis ~ 1957   yellow jaundice (01/14/2017)   Hepatomegaly    Hiatal hernia    History of blood transfusion  04/2012   when I had a heart attack   History of kidney stones    I've got a stone embedded in one of my kidneys (09/09/2017)   History of radiation therapy    Left Knee - 03/05/23-04/30/23- Dr. Lynwood Nasuti   History of stomach ulcers years ago   Irritable bowel syndrome (IBS)    Labile essential hypertension    Partially related to RAS   Mesenteric artery stenosis (HCC)    95% Celiac Artery - ostial, 20-30% SMA.  Bilateral Renal A: L RA stent patent, R RA 20-30% -- Conservative Management   Mobitz type 2 second degree AV block    Status post PPM placement   NSTEMI (non-ST elevated myocardial infarction) (HCC) 04/2012   Unclear the details, apparently this was postoperative from her back surgery that she was cleared for my last saw her in June.  Reportedly had stents placed   Pancreas divisum    On pancreatic enzyme   PAT (paroxysmal atrial tachycardia) (HCC)    Renal artery stenosis (HCC) 2011; 12/'13   a) Angiogram 02/03/12:  50-60%L RA stenosis, 40% R R Inferior artery; b) 12/'13: S/P L RA Stent ([facility]) 6.0 mm x 15 mm; c) Renal Duplex 10/2013: <60% L RA, <60 R RA, ~60% SMA & Celiac A.   S/P placement of cardiac pacemaker 07/2021   [employer]   Stricture and stenosis of esophagus    Thyroid  nodule    Unspecified gastritis and gastroduodenitis without mention of hemorrhage     Past Surgical History:  Past Surgical History:  Procedure Laterality Date   APPENDECTOMY     BACK SURGERY     BREAST DUCTAL SYSTEM EXCISION     right   BREAST SURGERY Right    CARDIAC CATHETERIZATION N/A 03/16/2015   Procedure: Left Heart Cath and Coronary Angiography;  Surgeon: Alm LELON Clay, MD;  Location: [facility] INVASIVE CV LAB;  Service: Cardiovascular; Widely patent m-dLAD stents as wellas pRCA stent.  High LVEDP, small Diag & Om vessels with moderate stenosis    Cardiac Event Monitor  01/2017   Mostly NSR with occasional sinus tachycardia and rare bradycardia. No A. fib. No PND or PSVT. Rare  PACs and PVCs.   CATARACT EXTRACTION W/ INTRAOCULAR LENS  IMPLANT, BILATERAL Bilateral    CHOLECYSTECTOMY OPEN     COLONOSCOPY  03/01/2009   normal    CORONARY ANGIOPLASTY WITH STENT PLACEMENT  07/30/2010   3.0x42mm Integrity BMS to RCA and 2.5x 15mm Integrity BMS LAD.     CORONARY ANGIOPLASTY WITH STENT PLACEMENT  03/18/2011   Cutting Balloon PTCA of D2 (jailed - 80% ostial stenosis), DES PCI of mid LAD ISR - > Promus DES 2.5 x 16 mm postdilated to 2.8 mm) covering the proximal portion of the previous stent   CORONARY STENT INTERVENTION N/A 04/20/2024   Procedure: CORONARY STENT INTERVENTION;  Surgeon: Clay Alm LELON, MD;  Location: [facility] INVASIVE CV LAB;  Service: Cardiovascular;  Laterality: N/A;   DOBUTAMINE  STRESS ECHO  03/07/2015  [facility] (ordered for pre-op evaluation for EUS/ERCP --> abnormal EKG:  strreesss test shhoowwed 1 mm ST segment depressions downsloping. No wall motion abnormalities at peak exercise or at rest. Diastolic dysfunction was noted but normal systolic function - EF greater than 55%. No bouts of regurgitation or stenosis. Resting hypertension with exaggerated response   ESOPHAGOGASTRODUODENOSCOPY  04/08/2012   ESOPHAGOGASTRODUODENOSCOPY (EGD) WITH ESOPHAGEAL DILATION  X 2   FRACTURE SURGERY     HAMMER TOE SURGERY Bilateral    took bone off the top of 2nd toe on each foot   HIP SURGERY Left    something to do w/my back   JOINT REPLACEMENT     KNEE SURGERY Left    had fluid drained off it a couple times   LEFT HEART CATH AND CORONARY ANGIOGRAPHY  07/30/2010   severe LAD-diagonal 80%, moderate to severe proximal RCA.  Mean PAP 15 mmHg.  PCWP mean 17 mmHg.  RVP 45/11 mmHg.  PAP 47/24 mmHg, mean 31 mmHg.  LVEDP 27 mmHg   LEFT HEART CATH AND CORONARY ANGIOGRAPHY N/A 04/20/2024   Procedure: LEFT HEART CATH AND CORONARY ANGIOGRAPHY;  Surgeon: Anner Alm ORN, MD;  Location: [facility] INVASIVE CV LAB;  Service: Cardiovascular;  Laterality: N/A;   LEFT HEART CATHETERIZATION  WITH CORONARY ANGIOGRAM N/A 02/03/2012   Procedure: LEFT HEART CATHETERIZATION WITH CORONARY ANGIOGRAM;  Surgeon: Alm ORN Anner, MD;  Location: [facility] CATH LAB; widely patent LAD and RCA stents.  Patent D2 ostium.  Moderate L Renal A stenosis (56%), R Renal A 40-50% t.  LVEDP 20 mmHg.   LEFT HEART CATHETERIZATION WITH CORONARY ANGIOGRAM N/A 05/12/2014   Procedure: LEFT HEART CATHETERIZATION WITH CORONARY ANGIOGRAM;  Surgeon: Dorn JINNY Lesches, MD;  Location: [facility] CATH LAB;  Service: Cardiovascular: Stable CAD. Patent stents. Patent renal artery stent   LEFT HEART CATHETERIZATION WITH CORONARY ANGIOGRAM  08/2012   Peri-Op MI @ [facility] -- 40% ostial D1, patent LAD stents, 10% RCA ISR   LEFT HEART CATHETERIZATION WITH CORONARY ANGIOGRAM   03/18/2011   70% ISR of LAD stent just after D2 (D2 has ostial 80 to 90% stenosis.  20 to 30% ISR RCA.  EDP elevated at 28 mmHg   NM MYOVIEW  LTD  12/2016   LOW RISK study. No ischemia or infarction. EF 65-75%.   NM MYOVIEW  LTD  01/05/2020    LOW RISK. EF 60-65%.  No EKG changes.  No infarct, no ischemia.   PACEMAKER IMPLANT N/A 07/09/2021   Procedure: PACEMAKER IMPLANT;  Surgeon: Francyne Headland, MD;  Location: [facility] INVASIVE CV LAB;  Service: Cardiovascular;  Laterality: N/A;   PANCREAS SURGERY     stent in my pancreas; Dr Essie Minor   PERCUTANEOUS PINNING PHALANX FRACTURE OF HAND Left ~ 2013   PERIPHERAL VASCULAR BALLOON ANGIOPLASTY  09/09/2017   Procedure: PERIPHERAL VASCULAR BALLOON ANGIOPLASTY;  Surgeon: Serene Gaile ORN, MD;  Location: [facility] INVASIVE CV LAB;  Service: Cardiovascular;;  Celiac instent   PERIPHERAL VASCULAR INTERVENTION  01/14/2017   Procedure: Peripheral Vascular Intervention;  Surgeon: Serene Gaile ORN, MD;  Location: [facility] INVASIVE CV LAB;  Service: Cardiovascular;;  mesentric   RENAL ARTERY STENT Left 08/2012   @ [facility] - 6.0 mm x 15 mm   SPINE SURGERY     tumor removed 07/2010; Redo Surgery 04/2012; Sacroiliac Sgx 08/2013    TOTAL ABDOMINAL HYSTERECTOMY     TOTAL KNEE ARTHROPLASTY Left ~ 2012   TRANSTHORACIC ECHOCARDIOGRAM  07/10/2021   (Postop PPM):  Normal LV size and function.  EF 60 to 65%.  No RWMA.  GR 1 DD.  Mild LA dilation.  Normal RV size and function.  Normal RAP.  Normal aortic and mitral valves.  No pericardial effusion.   VISCERAL ANGIOGRAM N/A 05/12/2014   Procedure: VISCERAL ANGIOGRAM;  Surgeon: Dorn JINNY Lesches, MD;  Location: [facility] CATH LAB;  25% ostial proximal celiac artery with downward takeoff.  23% proximal SMA and 56% proximal IMA.  Left renal artery stent widely patent.  Right renal artery is 20 to 30% proximal stenosis.   VISCERAL ANGIOGRAPHY N/A 01/14/2017   Procedure: Mesenteric  Angiography;  Surgeon: Serene Gaile ORN, MD;  Location: [facility] INVASIVE CV LAB;  Service: Cardiovascular;  Laterality: N/A;   VISCERAL ANGIOGRAPHY N/A 09/09/2017   Procedure: VISCERAL ANGIOGRAPHY;  Surgeon: Serene Gaile ORN, MD;  Location: [facility] INVASIVE CV LAB;  Service: Cardiovascular;  Laterality: N/A;    Allergies:  Allergies  Allergen Reactions   Codeine Phosphate Anaphylaxis, Shortness Of Breath and Swelling   Contrast Media [Iodinated Contrast Media] Swelling    Swelling of the face. Reaction was to ionic contrast many years ago. Patient has had non-ionic contrast many time without pre medication and has had no reaction.    Statins Other (See Comments)    Hyperactivity   Tramadol Palpitations and Other (See Comments)    Hyperactivity    Family History:  Family History  Problem Relation Age of Onset   Stroke Mother    Cancer Father        mets   Heart attack Father    Heart disease Father    Heart attack Brother    Diabetes Brother    Heart attack Brother    Colitis Maternal Grandfather    Cancer Daughter    Colon cancer Neg Hx    Stomach cancer Neg Hx     Social History:  Social History   Tobacco Use   Smoking status: Never   Smokeless tobacco: Never  Vaping Use   Vaping status: Never  Used  Substance Use Topics   Alcohol use: No   Drug use: No    ROS: Constitutional:  Negative for fever, chills, weight loss CV: Negative for chest pain, previous MI, hypertension Respiratory:  Negative for shortness of breath, wheezing, sleep apnea, frequent cough GI:  Negative for nausea, vomiting, bloody stool, GERD  Physical exam: There were no vitals taken for this visit. GENERAL APPEARANCE:  Well appearing, well developed, well nourished, NAD HEENT:  Atraumatic, normocephalic, oropharynx clear NECK:  Supple without lymphadenopathy or thyromegaly ABDOMEN:  Soft, non-tender, no masses EXTREMITIES:  Moves all extremities well, without clubbing, cyanosis, or edema NEUROLOGIC:  Alert and oriented x 3, normal gait, CN II-XII grossly intact MENTAL STATUS:  appropriate BACK:  Non-tender to palpation, No CVAT SKIN:  Warm, dry, and intact  Results: U/A:

## 2024-04-21 NOTE — Plan of Care (Signed)

## 2024-04-22 LAB — LIPOPROTEIN A (LPA): Lipoprotein (a): 10.9 nmol/L (ref <75.0)

## 2024-04-23 MED FILL — Lidocaine HCl Local Preservative Free (PF) Inj 1%: INTRAMUSCULAR | Qty: 30 | Status: AC

## 2024-04-26 ENCOUNTER — Telehealth (HOSPITAL_COMMUNITY): Payer: Self-pay

## 2024-04-26 ENCOUNTER — Telehealth: Payer: Self-pay | Admitting: Family Medicine

## 2024-04-26 ENCOUNTER — Ambulatory Visit: Payer: Self-pay

## 2024-04-26 NOTE — Telephone Encounter (Signed)
 Pt insurance is active and benefits verified through Medicare a/b Co-pay 0, DED $257/$257 met, out of pocket 0/0 met, co-insurance 20%. no pre-authorization required. Passport, 04/26/2024@11 :30, REF# [id number]  2ndary insurance is active and benefits verified through [facility]. Co-pay 0, DED 0/0 met, out of pocket 0/0 met, co-insurance 0. No pre-authorization required.   TCR/ICR? ICR Visit(date of service)limitation? No limit Can multiple codes be used on the same date of service/visit?(IF ITS A LIMIT) n/a  Is this a lifetime maximum or an annual maximum? annual Has the member used any of these services to date? no Is there a time limit (weeks/months) on start of program and/or program completion? no   Will contact patient to see if she is interested in the Cardiac Rehab Program. If interested, patient will need to complete follow up appt. Once completed, patient will be contacted for scheduling upon review by the RN Navigator.

## 2024-04-26 NOTE — Telephone Encounter (Signed)
 Triage not on separate encounter. Sent to dr. Katrinka for approval to work in sooner

## 2024-04-26 NOTE — Telephone Encounter (Signed)
 FYI Only or Action Required?: Action required by provider: request for appointment.  Patient was last seen in primary care on 02/19/2024 by Katrinka Garnette KIDD, MD.  Called Nurse Triage reporting Chest Pain left rib pain that radiated to right rib and lasts for several days and goes away. Pt stated she has been seen by Dr. Katrinka for this before but not better.  Symptoms began years ago.  Interventions attempted: OTC medications: Tylenol .  Symptoms are: gradually worsening.  Triage Disposition: See PCP When Office is Open (Within 3 Days)  Patient/caregiver understands and will follow disposition?: - Appt made for first available- Appt wait listed. Sent to office to see if can get an sooner appt.          Reason for Disposition  [1] Chest pain(s) lasting a few seconds AND [2] persists > 3 days    Lasts several days then goes away  Answer Assessment - Initial Assessment Questions 1. LOCATION: Where does it hurt?       Left side chest ribs  2. RADIATION: Does the pain go anywhere else? (e.g., into neck, jaw, arms, back)     To right chest  3. ONSET: When did the chest pain begin? (Minutes, hours or days)      years 4. PATTERN: Does the pain come and go, or has it been constant since it started?  Does it get worse with exertion?      Comes and goes 5. DURATION: How long does it last (e.g., seconds, minutes, hours)     Several days  6. SEVERITY: How bad is the pain?  (e.g., Scale 1-10; mild, moderate, or severe)    4/10 7. CARDIAC RISK FACTORS: Do you have any history of heart problems or risk factors for heart disease? (e.g., angina, prior heart attack; diabetes, high blood pressure, high cholesterol, smoker, or strong family history of heart disease)     Recent heart surgery (had issue prior to heart surgery) 9. CAUSE: What do you think is causing the chest pain?     unsure 10. OTHER SYMPTOMS: Do you have any other symptoms? (e.g., dizziness, nausea,  vomiting, sweating, fever, difficulty breathing, cough)       When pain is so bad makes her vomit, SOB with exertion  Protocols used: Chest Pain-A-AH

## 2024-04-26 NOTE — Telephone Encounter (Signed)
 Lets try 1020 on Thursday- may be a wait but we will do our best to squeeze her in here

## 2024-04-26 NOTE — Telephone Encounter (Signed)
 Patient would like to speak with someone regarding pain in her left side.. she has an appt 9/30 with Dr Katrinka but is hoping someone could see her sooner if possible for this pain.  Thank you, Darice FORBES [place] [facility] AWV TEAM Direct Dial [phone]

## 2024-04-26 NOTE — Telephone Encounter (Signed)
Awaiting triage note.  

## 2024-04-26 NOTE — Telephone Encounter (Signed)
 Called pt back. After pt informed me that it feels as though her ribs are broken. Sent pt to triage.

## 2024-04-26 NOTE — Telephone Encounter (Signed)
 Is there a day you prefer to work her in sooner.

## 2024-04-28 ENCOUNTER — Ambulatory Visit: Admitting: Family Medicine

## 2024-04-29 ENCOUNTER — Ambulatory Visit (INDEPENDENT_AMBULATORY_CARE_PROVIDER_SITE_OTHER)

## 2024-04-29 ENCOUNTER — Ambulatory Visit (INDEPENDENT_AMBULATORY_CARE_PROVIDER_SITE_OTHER): Admitting: Family Medicine

## 2024-04-29 ENCOUNTER — Encounter: Payer: Self-pay | Admitting: Family Medicine

## 2024-04-29 ENCOUNTER — Telehealth: Payer: Self-pay

## 2024-04-29 VITALS — BP 110/60 | HR 67 | Temp 97.7°F | Wt 194.4 lb

## 2024-04-29 DIAGNOSIS — E785 Hyperlipidemia, unspecified: Secondary | ICD-10-CM

## 2024-04-29 DIAGNOSIS — R0781 Pleurodynia: Secondary | ICD-10-CM

## 2024-04-29 DIAGNOSIS — I2510 Atherosclerotic heart disease of native coronary artery without angina pectoris: Secondary | ICD-10-CM

## 2024-04-29 DIAGNOSIS — K861 Other chronic pancreatitis: Secondary | ICD-10-CM

## 2024-04-29 DIAGNOSIS — M47814 Spondylosis without myelopathy or radiculopathy, thoracic region: Secondary | ICD-10-CM | POA: Diagnosis not present

## 2024-04-29 DIAGNOSIS — Z9861 Coronary angioplasty status: Secondary | ICD-10-CM | POA: Diagnosis not present

## 2024-04-29 DIAGNOSIS — R1013 Epigastric pain: Secondary | ICD-10-CM | POA: Diagnosis not present

## 2024-04-29 DIAGNOSIS — E1169 Type 2 diabetes mellitus with other specified complication: Secondary | ICD-10-CM

## 2024-04-29 MED ORDER — PREDNISONE 50 MG PO TABS
50.0000 mg | ORAL_TABLET | Freq: Four times a day (QID) | ORAL | 0 refills | Status: AC
Start: 2024-04-29 — End: 2024-04-30

## 2024-04-29 NOTE — Telephone Encounter (Signed)
 It has an order set for contrast allergy and I have ordered those labs for her- what facility is she recommending?

## 2024-04-29 NOTE — Progress Notes (Signed)
 Phone [phone] In person visit   Subjective:   [name] is a 83 y.o. year old very pleasant female patient who presents for/with See problem oriented charting Chief Complaint  Patient presents with   Flank Pain    Room 8. Pt states she has been having left side pain on and off for years. The pain is near her ribs and has gotten worse lately.   Discussed the use of AI scribe software for clinical note transcription with the patient, who gave verbal consent to proceed.  History of Present Illness   [name] is an 83 year old female with coronary artery disease who presents with worsening bilateral rib pain.  She has been experiencing worsening bilateral rib pain, primarily on the left side that she has had for many many years- seemed to worsen following the placement of two stents on August 19th. The pain has become more severe, frequent, and longer-lasting, sometimes causing nausea and vomiting. It occasionally radiates to the right ribs but is less severe and shorter in duration on that side. The pain is described as so intense that it feels visible from across the room.her pain is not exacerbated by meals but is tender to palpation.  Prior to the stent placement, she experienced chest pain and shortness of breath, which led to a nuclear PET scan on July 22nd, revealing abnormalities that prompted the stent procedure. Post-procedure, she is on aspirin , Plavix , and Eliquis  (with tapering of this regimen planned).  Extended history of workup over 20-year course at least She has a history of pancreas divisum with stents placed over 20 years ago, abdominal artery occlusions status post stent in celiac artery for 90% narrowing without imrpovement, and surgeries including appendectomy, cholecystectomy, and hysterectomy. Previous workups, including CT scans, MRIs, and endoscopies, have not identified a clear cause for her symptoms. She has been evaluated by vascular surgery and  gastroenterology without definitive findings.   She also reports chronic diarrhea (colonoscopy has been difficult due to difficult to traverse sigmoid colon) and occasional epigastric pain.  Her medication regimen includes aspirin , Plavix , Eliquis , and occasional use of hydrocodone  and Tylenol  for pain management. She has a noted allergy to contrast, requiring premedication with prednisone  and Benadryl  for imaging studies.  In the review of symptoms, she reports midline thoracic spine pain that  mayradiate to the ribs and difficulty breathing when the pain is severe. No lower abdominal pain.  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    Priority: High   Osteoporosis 01/22/2022    Priority: High   Pacemaker 07/09/2021    Priority: High   Mobitz type 2 second degree atrioventricular block 06/20/2021    Priority: High   PAT (paroxysmal atrial tachycardia) (HCC) 06/20/2021    Priority: High   PAF (paroxysmal atrial fibrillation) (HCC) 10/16/2020    Priority: High   PAD (peripheral artery disease) (HCC) 01/14/2017    Priority: High   Type 2 diabetes mellitus with peripheral neuropathy (HCC) 10/09/2016    Priority: High   Mesenteric artery stenosis (HCC) 04/20/2014    Priority: High   Abdominal pain, epigastric 01/19/2014    Priority: High   Congestive heart failure with LV diastolic dysfunction, NYHA class 2 (HCC)     Priority: High   Celiac artery stenosis: 60% by Duplex - 90-95% by cath - Med Rx  11/27/2013    Priority: High   Renal artery stenosis (HCC) 08/29/2012    Priority: High   Coronary artery disease involving native coronary artery of native heart with angina pectoris (HCC) 10/30/2011    Priority: High   CAD S/P percutaneous coronary angioplasty: pRCA BMS, mLAD BMS overlapped prox with DES for ISR 07/30/2010    Priority: High   B12  deficiency 01/10/2022    Priority: Medium    Continuous severe abdominal pain 10/11/2016    Priority: Medium    Exertional dyspnea 03/16/2015    Priority: Medium    Idiopathic chronic pancreatitis suspected 03/10/2014    Priority: Medium    Abnormal CT of liver-possible cirrhosis 03/10/2014    Priority: Medium    Stasis edema of bilateral lower extremity     Priority: Medium    Labile hypertension     Priority: Medium    Hyperlipidemia associated with type 2 diabetes mellitus (HCC)     Priority: Medium    Myalgia due to statin 12/29/2011    Priority: Medium    GERD (gastroesophageal reflux disease) 01/24/2009    Priority: Medium    Hypothyroid 11/12/2007    Priority: Medium    Irritable bowel syndrome 11/12/2007    Priority: Medium    PANCREAS DIVISUM 11/12/2007    Priority: Medium    Allergic rhinitis 01/10/2022    Priority: Low   Pneumothorax 07/10/2021    Priority: Low   Syncope and collapse 06/20/2021    Priority: Low   Hallux valgus (acquired), right foot 08/17/2018    Priority: Low   Costochondritis 01/21/2017    Priority: Low   Onychomycosis due to dermatophyte 11/16/2015    Priority: Low   Abnormal pharmacologic myocardial perfusion study 03/15/2015    Priority: Low   Dysphagia 03/10/2014    Priority: Low   Carotid stenosis 02/27/2014    Priority: Low   Leg cramps 01/19/2014    Priority: Low   Palpitations 01/12/2014    Priority: Low   Diarrhea due to malabsorption 01/24/2009    Priority: Low   Nephrolithiasis 11/12/2007    Priority: Low   HIATAL HERNIA 11/20/2005    Priority: Low   Atypical angina (HCC) 04/02/2024   Gross hematuria 11/19/2023   Hypercoagulable state due to paroxysmal atrial fibrillation (HCC) 10/01/2023   Nodular fasciitis 12/20/2022    Medications- reviewed and updated Current Outpatient Medications  Medication Sig Dispense Refill   allopurinol  (ZYLOPRIM ) 100 MG tablet TAKE 1 TABLET BY MOUTH EVERY DAY 90 tablet 2   amiodarone   (PACERONE ) 200 MG tablet TAKE 400 MG (2 TABLETS) ONE DOSE AS NEED FOR A FAST HEART RATE ,IF AFTER 12 HOURS STILL PRESENT TAKE 200 MG ( 1 TABLET) ONE DOSE FOR EACH EPISODE 180 tablet 1   apixaban  (ELIQUIS ) 5 MG TABS tablet Take 1 tablet (5 mg total) by mouth 2 (two) times daily. 180 tablet 3   Artificial Tear Solution (TEARS RENEWED OP) Apply 1 drop to eye daily as needed (dry eyes).     aspirin  EC 81 MG tablet Take 1 tablet (81 mg total) by mouth daily for 14 days. Swallow whole. 14 tablet 0   bismuth  subsalicylate (PEPTO BISMOL) 262 MG/15ML suspension Take 30 mLs by mouth every 6 (six) hours as needed for diarrhea or loose stools.     carvedilol  (COREG ) 12.5 MG tablet Take 1 tablet (12.5 mg total) by mouth 2 (two) times daily. May take an additional 12.5 mg if needed for extra palp. 180  tablet 3   clopidogrel  (PLAVIX ) 75 MG tablet Take 1 tablet (75 mg total) by mouth daily with breakfast. 90 tablet 1   colchicine 0.6 MG tablet Take 0.6 mg by mouth daily as needed (Gout).     cyanocobalamin  (VITAMIN B12) 1000 MCG tablet Take 1,000 mcg by mouth daily.     diclofenac sodium (VOLTAREN) 1 % GEL Apply 2 g topically 4 (four) times daily as needed (pain).     furosemide  (LASIX ) 80 MG tablet TAKE 1 TABLET(80 MG) BY MOUTH ONCE A DAY , MAY TAKE AN ADDITIONAL 1 TABLET IF NEEDED FOR SWELLING 180 tablet 3   isosorbide  mononitrate (IMDUR ) 60 MG 24 hr tablet Take 1 tablet (60 mg total) by mouth daily. May take an extra dose if you use Nitroglycerin   sublingual tablet for 2 days afterwards 100 tablet 3   lidocaine  (XYLOCAINE ) 5 % ointment Apply 1 application. topically as needed. 35.44 g 0   losartan  (COZAAR ) 50 MG tablet TAKE 1 TABLET BY MOUTH EVERY DAY 90 tablet 3   meclizine  (ANTIVERT ) 12.5 MG tablet Take 1 tablet (12.5 mg total) by mouth 2 (two) times daily as needed for dizziness. 60 tablet 2   nitroGLYCERIN  (NITROSTAT ) 0.4 MG SL tablet DISSOLVE 1 TABLET UNDER THE TONGUE EVERY 5 MINUTES AS NEEDED FOR CHEST PAIN  25 tablet 2   nystatin  (MYCOSTATIN /NYSTOP ) powder Apply 1 Application topically 3 (three) times daily. 30 g 2   predniSONE  (DELTASONE ) 50 MG tablet Take 1 tablet (50 mg total) by mouth every 6 (six) hours for 3 doses. Take on dates/times instructed by prescribing provider.  Take 13, 7 and 1 hour before contrast injection. Also can take 50 mg benadryl  at an hour before the procedure as well. Have husband drive you 3 tablet 0   solifenacin  (VESICARE ) 10 MG tablet Take 1 tablet (10 mg total) by mouth daily. 30 tablet 11   spironolactone  (ALDACTONE ) 25 MG tablet TAKE 1 TABLET BY MOUTH AS DIRECTED. TAKE 1 TABLET ON MONDAYS, WEDNESDAYS, AND FRIDAYS AT 6 PM. 45 tablet 1   alendronate  (FOSAMAX ) 70 MG tablet TAKE 1 TABLET (70 MG TOTAL) BY MOUTH EVERY 7 DAYS WITH FULL GLASS WATER ON EMPTY STOMACH (Patient not taking: Reported on 04/29/2024) 12 tablet 3   hyoscyamine  (ANASPAZ ) 0.125 MG TBDP disintergrating tablet Place 1 tablet (0.125 mg total) under the tongue every 6 (six) hours as needed (abdominal cramping). (Patient not taking: Reported on 04/29/2024) 30 tablet 5   indapamide  (LOZOL ) 2.5 MG tablet Take 2.5 mg by mouth daily. (Patient not taking: Reported on 04/29/2024)     No current facility-administered medications for this visit.     Objective:  BP 110/60   Pulse 67   Temp 97.7 F (36.5 C) (Temporal)   Wt 194 lb 6.4 oz (88.2 kg)   SpO2 97%   BMI 29.56 kg/m  Gen: NAD, resting comfortably CV: RRR no murmurs rubs or gallops -Very tender to palpation over left lower ribs, no tenderness to palpation over right lower ribs Lungs: CTAB no crackles, wheeze, rhonchi Abdomen: soft/mild tenderness in the epigastric area/nondistended/normal bowel sounds. No rebound or guarding.  Ext: no edema Skin: warm, dry MSK: Midline pain in thoracic spine with some radiation of pain with palpation into the ribs reported    Assessment and Plan       #Chronic bilateral rib pain, worse on left/epigastric  pain/midline thoracic back pain on exam Chronic bilateral rib pain over 20 years at least, predominantly on the left  side, with increased severity and frequency in the last week. Pain is sometimes associated with nausea and vomiting. Differential diagnosis includes musculoskeletal causes such as thoracic radiculopathy or costochondritis. Tenderness to palpation suggests a potentially musculoskeletal cause. Previous imaging and workups have not identified a clear etiology. Consideration of thoracic spine issues due to midline thoracic spine pain. - Order thoracic spine x-ray to evaluate for potential thoracic radiculopathy or other musculoskeletal causes. - Consider referral to sports medicine for evaluation and possible manipulation or even epidural injections or potentially even trigger point injections-also wonder about physical medicine and rehabilitation-she has an established relationship with Dr. Joane who has been very helpful and she wants to sit down to discuss with him and plans to schedule visit. - We we will also order CT angiogram of the abdomen to see if further vascular evaluation is needed.  She has required prior stents of the celiac artery but this did not improve her symptoms previously - Consider MRI of the thoracic spine if x-ray is inconclusive as well as CT angiogram. -In regards to potential steroid injections she has not tolerated prednisone  orally in the past.  Prior NSAID trials have not helped pain for potential costochondritis either - In the past has had MRI/MRCP with stable biliary duct dilation.  Lipase and LFTs have not been elevated in the past even with pancreas divisum but we opted to recheck that today - Xifaxan  trial not beneficial in the past - Has also worked with biliary specialist Dr. Alvan at [facility] in the past who did not think her pain was secondary to bile duct stricture or obstruction - At last GI visit they suspected she would have continued chronic pain with  extensive workup and no benefit..  They also recommended potential Elavil  - Another option would be abdominal migraine but once again interesting tender to palpation -I offered referral back to GI especially with ongoing diarrhea which could be IBS related as well as potential need for endoscopy as she declines this  Epigastric pain Intermittent epigastric pain, sometimes associated with rib pain. No clear organic cause identified in previous workups. Pain is not exacerbated by meals. - Order lipase and amylase to evaluate for pancreatic involvement. - Consider further imaging if lab results indicate pancreatic issues.  Chronic pancreatitis Chronic pancreatitis with previous stent placement. Current symptoms do not clearly indicate acute pancreatitis, but pancreatic function will be evaluated due to ongoing abdominal pain. - Order lipase and amylase to assess pancreatic function.  Coronary artery disease, post-stent placement Recent stent placement on April 20, 2024, for coronary artery disease. Current medication regimen includes aspirin , Plavix , and Eliquis . No improvement in rib pain post-stent placement. - Continue current medication regimen with aspirin , Plavix , and Eliquis  as planned. -Aspirin  is planned only 2 weeks and Plavix  for 6 months but will continue Eliquis  long-term after that  Chronic diarrhea Long-standing chronic diarrhea, with recent exacerbation noted on Saturday.  She declines referral back to GI-chronic dysuria and wants to avoid more significant workup  Contrast agent allergy Contrast agent allergy with previous swelling. Pre-medication with prednisone  and Benadryl  required for procedures involving contrast. - Administer prednisone  and Benadryl  prior to any procedures involving contrast including upcoming CT angiogram     Recommended follow up: Return for next already scheduled visit or sooner if needed. Future Appointments  Date Time Provider Department Center   05/11/2024 10:00 AM Anner Alm ORN, MD [facility]  05/17/2024  4:20 PM Anner Alm ORN, MD [facility]  05/27/2024  3:40  PM [facility] ANNUAL WELLNESS VISIT 1 [facility]ian  06/01/2024  3:00 PM Katrinka Garnette KIDD, MD [facility]  06/10/2024 10:15 AM Shannon Agent, MD [facility] None  06/17/2024 10:30 AM [facility] None  06/17/2024 10:45 AM Ennever, Maude SAUNDERS, MD [facility] None  07/09/2024  7:05 AM [facility] DEVICE REMOTES [facility]  10/08/2024  7:05 AM [facility] DEVICE REMOTES [facility]  01/07/2025  7:05 AM [facility] DEVICE REMOTES [facility]  04/08/2025  7:05 AM [facility] DEVICE REMOTES [facility]  07/08/2025  7:05 AM [facility] DEVICE REMOTES [facility]    Lab/Order associations:   ICD-10-CM   1. Rib pain  R07.81 DG Thoracic Spine W/Swimmers    CT ANGIO ABDOMEN W &/OR WO CONTRAST    CBC with Differential/Platelet    Comprehensive metabolic panel with GFR    Lipase    Amylase    2. Epigastric abdominal pain  R10.13 CT ANGIO ABDOMEN W &/OR WO CONTRAST    CBC with Differential/Platelet    Comprehensive metabolic panel with GFR    Lipase    Amylase    3. CAD S/P percutaneous coronary angioplasty: pRCA BMS, mLAD BMS overlapped prox with DES for ISR  I25.10    Z98.61     4. Hyperlipidemia associated with type 2 diabetes mellitus (HCC)  E11.69    E78.5     5. Chronic pancreatitis, unspecified pancreatitis type (HCC) Chronic K86.1       Meds ordered this encounter  Medications   predniSONE  (DELTASONE ) 50 MG tablet    Sig: Take 1 tablet (50 mg total) by mouth every 6 (six) hours for 3 doses. Take on dates/times instructed by prescribing provider.  Take 13, 7 and 1 hour before contrast injection. Also can take 50 mg benadryl  at an hour before the procedure as well. Have husband drive you    Dispense:  3 tablet    Refill:  0    Return precautions advised.  Garnette Katrinka, MD

## 2024-04-29 NOTE — Patient Instructions (Addendum)
 Stop by x-ray before you go  We will call you within two weeks about your referral for CT angiogram through [facility].  Their phone number is [phone].  Please call them if you have not heard in 1-2 weeks  Possibly sports medicine visit with Dr. Joane for his input on this- can call about shoulders and mention our initial workup and if he has other thoughts  Recommended follow up: Return for next already scheduled visit or sooner if needed.

## 2024-04-29 NOTE — Telephone Encounter (Signed)
 Copied from CRM [id number]. Topic: General - Other >> Apr 29, 2024  4:31 PM Roselie BROCKS wrote: Reason for CRM: Sonny from [facility] called concerning a order sent to them to schedule patient for a CT angiogram  of the abdomen .  Sonny did let me know they can not schedule the patient because of allergies the patient has. And will need to be sent order to a different office.

## 2024-04-30 LAB — CBC WITH DIFFERENTIAL/PLATELET
Basophils Absolute: 0.1 K/uL (ref 0.0–0.1)
Basophils Relative: 1.4 % (ref 0.0–3.0)
Eosinophils Absolute: 0.2 K/uL (ref 0.0–0.7)
Eosinophils Relative: 2.9 % (ref 0.0–5.0)
HCT: 32.7 % — ABNORMAL LOW (ref 36.0–46.0)
Hemoglobin: 11 g/dL — ABNORMAL LOW (ref 12.0–15.0)
Lymphocytes Relative: 19.1 % (ref 12.0–46.0)
Lymphs Abs: 1.5 K/uL (ref 0.7–4.0)
MCHC: 33.7 g/dL (ref 30.0–36.0)
MCV: 91.4 fl (ref 78.0–100.0)
Monocytes Absolute: 1 K/uL (ref 0.1–1.0)
Monocytes Relative: 12.5 % — ABNORMAL HIGH (ref 3.0–12.0)
Neutro Abs: 5.2 K/uL (ref 1.4–7.7)
Neutrophils Relative %: 64.1 % (ref 43.0–77.0)
Platelets: 272 K/uL (ref 150.0–400.0)
RBC: 3.57 Mil/uL — ABNORMAL LOW (ref 3.87–5.11)
RDW: 15.9 % — ABNORMAL HIGH (ref 11.5–15.5)
WBC: 8.1 K/uL (ref 4.0–10.5)

## 2024-04-30 LAB — COMPREHENSIVE METABOLIC PANEL WITH GFR
ALT: 10 U/L (ref 0–35)
AST: 11 U/L (ref 0–37)
Albumin: 4.2 g/dL (ref 3.5–5.2)
Alkaline Phosphatase: 68 U/L (ref 39–117)
BUN: 53 mg/dL — ABNORMAL HIGH (ref 6–23)
CO2: 23 meq/L (ref 19–32)
Calcium: 9.3 mg/dL (ref 8.4–10.5)
Chloride: 99 meq/L (ref 96–112)
Creatinine, Ser: 1.85 mg/dL — ABNORMAL HIGH (ref 0.40–1.20)
GFR: 25.15 mL/min — ABNORMAL LOW (ref >60.00)
Glucose, Bld: 94 mg/dL (ref 70–99)
Potassium: 3.8 meq/L (ref 3.5–5.1)
Sodium: 135 meq/L (ref 135–145)
Total Bilirubin: 0.4 mg/dL (ref 0.2–1.2)
Total Protein: 7.2 g/dL (ref 6.0–8.3)

## 2024-04-30 LAB — LIPASE: Lipase: 205 U/L — ABNORMAL HIGH (ref 11.0–59.0)

## 2024-04-30 LAB — AMYLASE: Amylase: 32 U/L (ref 27–131)

## 2024-05-01 ENCOUNTER — Ambulatory Visit: Payer: Self-pay | Admitting: Family Medicine

## 2024-05-04 ENCOUNTER — Other Ambulatory Visit: Payer: Self-pay | Admitting: Family Medicine

## 2024-05-04 DIAGNOSIS — K861 Other chronic pancreatitis: Secondary | ICD-10-CM

## 2024-05-04 DIAGNOSIS — N289 Disorder of kidney and ureter, unspecified: Secondary | ICD-10-CM

## 2024-05-04 NOTE — Telephone Encounter (Signed)
 See lab result 05/01/2024-please make sure she gets that message and makes those adjustments and lets recheck her kidney function a week after making the adjustments-for now I do not want her to have a CT angiogram-please inform her that mainly due to kidney function worsening

## 2024-05-04 NOTE — Telephone Encounter (Signed)
 Would you still like the angiogram? Sorry looks like this fell through the cracks while I was out last week.

## 2024-05-05 ENCOUNTER — Other Ambulatory Visit

## 2024-05-05 ENCOUNTER — Other Ambulatory Visit (INDEPENDENT_AMBULATORY_CARE_PROVIDER_SITE_OTHER)

## 2024-05-05 DIAGNOSIS — N289 Disorder of kidney and ureter, unspecified: Secondary | ICD-10-CM | POA: Diagnosis not present

## 2024-05-05 DIAGNOSIS — K861 Other chronic pancreatitis: Secondary | ICD-10-CM | POA: Diagnosis not present

## 2024-05-05 LAB — COMPREHENSIVE METABOLIC PANEL WITH GFR
ALT: 9 U/L (ref 0–35)
AST: 10 U/L (ref 0–37)
Albumin: 3.7 g/dL (ref 3.5–5.2)
Alkaline Phosphatase: 59 U/L (ref 39–117)
BUN: 49 mg/dL — ABNORMAL HIGH (ref 6–23)
CO2: 25 meq/L (ref 19–32)
Calcium: 8.7 mg/dL (ref 8.4–10.5)
Chloride: 100 meq/L (ref 96–112)
Creatinine, Ser: 1.66 mg/dL — ABNORMAL HIGH (ref 0.40–1.20)
GFR: 28.64 mL/min — ABNORMAL LOW (ref >60.00)
Glucose, Bld: 121 mg/dL — ABNORMAL HIGH (ref 70–99)
Potassium: 3.5 meq/L (ref 3.5–5.1)
Sodium: 135 meq/L (ref 135–145)
Total Bilirubin: 0.4 mg/dL (ref 0.2–1.2)
Total Protein: 6.5 g/dL (ref 6.0–8.3)

## 2024-05-05 LAB — LIPASE: Lipase: 35 U/L (ref 11.0–59.0)

## 2024-05-06 ENCOUNTER — Ambulatory Visit: Payer: Self-pay | Admitting: Family Medicine

## 2024-05-07 DIAGNOSIS — Z950 Presence of cardiac pacemaker: Secondary | ICD-10-CM | POA: Diagnosis not present

## 2024-05-07 DIAGNOSIS — C4922 Malignant neoplasm of connective and soft tissue of left lower limb, including hip: Secondary | ICD-10-CM | POA: Diagnosis not present

## 2024-05-07 DIAGNOSIS — M7989 Other specified soft tissue disorders: Secondary | ICD-10-CM | POA: Diagnosis not present

## 2024-05-07 DIAGNOSIS — C499 Malignant neoplasm of connective and soft tissue, unspecified: Secondary | ICD-10-CM | POA: Diagnosis not present

## 2024-05-07 DIAGNOSIS — Z96652 Presence of left artificial knee joint: Secondary | ICD-10-CM | POA: Diagnosis not present

## 2024-05-07 DIAGNOSIS — R600 Localized edema: Secondary | ICD-10-CM | POA: Diagnosis not present

## 2024-05-11 ENCOUNTER — Ambulatory Visit: Admitting: Cardiology

## 2024-05-12 ENCOUNTER — Other Ambulatory Visit (INDEPENDENT_AMBULATORY_CARE_PROVIDER_SITE_OTHER)

## 2024-05-12 DIAGNOSIS — N289 Disorder of kidney and ureter, unspecified: Secondary | ICD-10-CM

## 2024-05-13 ENCOUNTER — Ambulatory Visit (INDEPENDENT_AMBULATORY_CARE_PROVIDER_SITE_OTHER): Admitting: Family Medicine

## 2024-05-13 ENCOUNTER — Ambulatory Visit: Payer: Self-pay | Admitting: Family Medicine

## 2024-05-13 ENCOUNTER — Other Ambulatory Visit: Payer: Self-pay

## 2024-05-13 VITALS — BP 132/70 | HR 67 | Ht 68.0 in | Wt 198.0 lb

## 2024-05-13 DIAGNOSIS — M25512 Pain in left shoulder: Secondary | ICD-10-CM

## 2024-05-13 DIAGNOSIS — M25511 Pain in right shoulder: Secondary | ICD-10-CM | POA: Diagnosis not present

## 2024-05-13 DIAGNOSIS — M5414 Radiculopathy, thoracic region: Secondary | ICD-10-CM

## 2024-05-13 DIAGNOSIS — M549 Dorsalgia, unspecified: Secondary | ICD-10-CM

## 2024-05-13 DIAGNOSIS — G8929 Other chronic pain: Secondary | ICD-10-CM | POA: Diagnosis not present

## 2024-05-13 LAB — BASIC METABOLIC PANEL WITH GFR
BUN: 63 mg/dL — ABNORMAL HIGH (ref 6–23)
CO2: 23 meq/L (ref 19–32)
Calcium: 9.4 mg/dL (ref 8.4–10.5)
Chloride: 106 meq/L (ref 96–112)
Creatinine, Ser: 1.54 mg/dL — ABNORMAL HIGH (ref 0.40–1.20)
GFR: 31.34 mL/min — ABNORMAL LOW (ref >60.00)
Glucose, Bld: 124 mg/dL — ABNORMAL HIGH (ref 70–99)
Potassium: 4.1 meq/L (ref 3.5–5.1)
Sodium: 139 meq/L (ref 135–145)

## 2024-05-13 NOTE — Patient Instructions (Addendum)
 Thank you for coming in today.   You received an injection today. Seek immediate medical attention if the joint becomes red, extremely painful, or is oozing fluid.   MRI ordered for your mid-back.   See you back to review results.

## 2024-05-13 NOTE — Progress Notes (Unsigned)
 LILLETTE Ileana Collet, PhD, LAT, ATC acting as a scribe for Artist Lloyd, MD.  [name] is a 83 y.o. female who presents to [facility] today for exacerbation of her bilat shoulder pain. Pt was last seen by Dr. Lloyd on 10/01/23 and was given bilat GH steroid injections.  Today, pt reports PCP wants Dr. Lloyd to look at [facility] of her back to discuss if this could be causing her side pain. Mostly the L flank, but occasionally will also be the R or both. Prior GH steroid injections in her last about 3 months.  Pertinent review of systems: No fevers or chills  Relevant historical information: Heart disease with pacemaker.  Renal artery stenosis.   Exam:  BP 132/70   Pulse 67   Ht 5' 8 (1.727 m)   Wt 198 lb (89.8 kg)   SpO2 97%   BMI 30.11 kg/m  General: Well Developed, well nourished, and in no acute distress.   MSK: T-spine nontender to palpation midline.  Decreased thoracic motion.  Upper extremity strength is intact.  Shoulders bilaterally normal-appearing nontender to palpation.  Decreased shoulder motion.    Lab and Radiology Results  Procedure: Real-time Ultrasound Guided Injection of right shoulder glenohumeral joint posterior approach Device: Philips Affiniti 50G/GE Logiq Images permanently stored and available for review in PACS Verbal informed consent obtained.  Discussed risks and benefits of procedure. Warned about infection, bleeding, hyperglycemia damage to structures among others. Patient expresses understanding and agreement Time-out conducted.   Noted no overlying erythema, induration, or other signs of local infection.   Skin prepped in a sterile fashion.   Local anesthesia: Topical Ethyl chloride.   With sterile technique and under real time ultrasound guidance: 40 mg of Kenalog  and 2 mL's of Marcaine  injected into shoulder joint. Fluid seen entering the joint capsule.   Completed without difficulty   Pain immediately resolved  suggesting accurate placement of the medication.   Advised to call if fevers/chills, erythema, induration, drainage, or persistent bleeding.   Images permanently stored and available for review in the ultrasound unit.  Impression: Technically successful ultrasound guided injection.    Procedure: Real-time Ultrasound Guided Injection of left shoulder glenohumeral joint posterior approach Device: Philips Affiniti 50G/GE Logiq Images permanently stored and available for review in PACS Verbal informed consent obtained.  Discussed risks and benefits of procedure. Warned about infection, bleeding, hyperglycemia damage to structures among others. Patient expresses understanding and agreement Time-out conducted.   Noted no overlying erythema, induration, or other signs of local infection.   Skin prepped in a sterile fashion.   Local anesthesia: Topical Ethyl chloride.   With sterile technique and under real time ultrasound guidance: 40 mg of Kenalog  and 2 mL of Marcaine  injected into shoulder joint. Fluid seen entering the joint capsule.   Completed without difficulty   Pain immediately resolved suggesting accurate placement of the medication.   Advised to call if fevers/chills, erythema, induration, drainage, or persistent bleeding.   Images permanently stored and available for review in the ultrasound unit.  Impression: Technically successful ultrasound guided injection.    EXAM: THORACIC SPINE - 3 VIEWS   COMPARISON:  05/24/2022   FINDINGS: Stable thoracolumbar spine fixation hardware. Large anterior and right lateral spurs at the T8-9 level, moderate anterior spur formation at the T9-10 level and mild-to-moderate anterior spur formation at the T7-8 level. Mild anterior spur formation at the upper levels of the thoracic spine. The hardware fusion begins at the  T10 level and extends inferiorly.   IMPRESSION: 1. Stable thoracolumbar spine fixation hardware. 2. Multilevel degenerative  changes, as described above.     Electronically Signed   By: Elspeth Bathe M.D.   On: 05/06/2024 10:18 I, Artist Lloyd, personally (independently) visualized and performed the interpretation of the images attached in this note.      Assessment and Plan: 83 y.o. female with chronic thoracic back pain with no radiating pain or radicular pain to the left flank consistent with T7-T8 thoracic radiculopathy.  She does have degenerative changes at this level on x-ray.  Plan for MRI to further characterize source of symptoms.  This has been a chronic ongoing issue for over 3 months now that has failed to improve with home exercise program.  Bilateral shoulder pain due to DJD.  Plan for steroid injection.  Can repeat this injection in 3 months if needed.   PDMP not reviewed this encounter. Orders Placed This Encounter  Procedures   US  LIMITED JOINT SPACE STRUCTURES UP BILAT(NO LINKED CHARGES)    Reason for Exam (SYMPTOM  OR DIAGNOSIS REQUIRED):   bilateral shoulder pain    Preferred imaging location?:   [facility]   MR THORACIC SPINE WO CONTRAST    Standing Status:   Future    Expiration Date:   06/12/2024    What is the patient's sedation requirement?:   No Sedation    Does the patient have a pacemaker or implanted devices?:   Yes    Preferred imaging location?:   [facility] (table limit - 500lbs)   No orders of the defined types were placed in this encounter.    Discussed warning signs or symptoms. Please see discharge instructions. Patient expresses understanding.   The above documentation has been reviewed and is accurate and complete Artist Lloyd, M.D.

## 2024-05-17 ENCOUNTER — Encounter: Payer: Self-pay | Admitting: Cardiology

## 2024-05-17 ENCOUNTER — Ambulatory Visit: Attending: Cardiology | Admitting: Cardiology

## 2024-05-17 VITALS — BP 133/75 | HR 79 | Ht 68.0 in | Wt 199.0 lb

## 2024-05-17 DIAGNOSIS — I480 Paroxysmal atrial fibrillation: Secondary | ICD-10-CM | POA: Insufficient documentation

## 2024-05-17 DIAGNOSIS — R0609 Other forms of dyspnea: Secondary | ICD-10-CM | POA: Diagnosis not present

## 2024-05-17 DIAGNOSIS — Z9861 Coronary angioplasty status: Secondary | ICD-10-CM | POA: Diagnosis not present

## 2024-05-17 DIAGNOSIS — M419 Scoliosis, unspecified: Secondary | ICD-10-CM | POA: Insufficient documentation

## 2024-05-17 DIAGNOSIS — I10 Essential (primary) hypertension: Secondary | ICD-10-CM | POA: Insufficient documentation

## 2024-05-17 DIAGNOSIS — R55 Syncope and collapse: Secondary | ICD-10-CM | POA: Insufficient documentation

## 2024-05-17 DIAGNOSIS — E785 Hyperlipidemia, unspecified: Secondary | ICD-10-CM | POA: Diagnosis not present

## 2024-05-17 DIAGNOSIS — I25119 Atherosclerotic heart disease of native coronary artery with unspecified angina pectoris: Secondary | ICD-10-CM | POA: Diagnosis not present

## 2024-05-17 DIAGNOSIS — I2510 Atherosclerotic heart disease of native coronary artery without angina pectoris: Secondary | ICD-10-CM | POA: Insufficient documentation

## 2024-05-17 DIAGNOSIS — I441 Atrioventricular block, second degree: Secondary | ICD-10-CM | POA: Insufficient documentation

## 2024-05-17 DIAGNOSIS — D6869 Other thrombophilia: Secondary | ICD-10-CM | POA: Diagnosis not present

## 2024-05-17 DIAGNOSIS — M791 Myalgia, unspecified site: Secondary | ICD-10-CM | POA: Diagnosis not present

## 2024-05-17 DIAGNOSIS — I87302 Chronic venous hypertension (idiopathic) without complications of left lower extremity: Secondary | ICD-10-CM | POA: Insufficient documentation

## 2024-05-17 DIAGNOSIS — I5030 Unspecified diastolic (congestive) heart failure: Secondary | ICD-10-CM | POA: Insufficient documentation

## 2024-05-17 DIAGNOSIS — E1169 Type 2 diabetes mellitus with other specified complication: Secondary | ICD-10-CM | POA: Insufficient documentation

## 2024-05-17 DIAGNOSIS — N183 Chronic kidney disease, stage 3 unspecified: Secondary | ICD-10-CM | POA: Diagnosis not present

## 2024-05-17 DIAGNOSIS — T466X5A Adverse effect of antihyperlipidemic and antiarteriosclerotic drugs, initial encounter: Secondary | ICD-10-CM | POA: Diagnosis not present

## 2024-05-17 MED ORDER — FUROSEMIDE 80 MG PO TABS: 80.0000 mg | ORAL_TABLET | Freq: Two times a day (BID) | ORAL | 3 refills | Status: AC

## 2024-05-17 MED ORDER — CARVEDILOL 6.25 MG PO TABS: 6.2500 mg | ORAL_TABLET | Freq: Two times a day (BID) | ORAL | 3 refills | Status: DC

## 2024-05-17 NOTE — Assessment & Plan Note (Addendum)
 We have done ischemic evaluation and treatment and she still has exertional dyspnea.  I do not think that this is her anginal equivalent as it is highly unlikely that she would have noted improvement with PCI to significant RCA lesions.  I do think there is some component of diastolic dysfunction mediated symptoms.  - Check 2D echo and BNP level - Titrating up diuretic to hopefully achieve 5 to 6 pound weight loss. - Low threshold to consider switching from losartan  to [facility]  I think she would benefit from cardiac rehab, but I still think she will take the time to do it.

## 2024-05-17 NOTE — Assessment & Plan Note (Signed)
 Very successful results on PCI to the RCA.  Not unexpectedly, she continues to have unusual symptoms but now noting symptoms of dyspnea.  She had pretty significant dyspnea post cath likely related to elevated LVEDP.   She still has some chest pain pain, but notes that it is less severe than prior to stenting. No significant new stenosis, stress test negative for myocardial injury.  Symptoms likely due to diastolic dysfunction. - Decrease Imdur  to 30 mg daily to reserve higher doses for future angina management. -With possible fatigue and dyspnea related to troponin incompetence, will reduce carvedilol  dose to 6.25 mg twice daily and continue current dose of losartan  at 50 mg daily - Continue Plavix  for six months post-stenting as double therapy with Eliquis  5 mg daily

## 2024-05-17 NOTE — Assessment & Plan Note (Signed)
 Chronic diastolic heart failure with volume overload causing shortness of breath and fatigue. Preserved heart function with diastolic dysfunction leading to fluid backup and pulmonary congestion. - Decrease carvedilol  to 6.25 mg twice daily; for now continue losartan  50 mg daily but low threshold to titrate further versus convert to Entresto -Continue spironolactone  3 days a week - Increase furosemide  to 160 mg daily (80 mg twice daily) for three days, then adjust based on weight loss. - Monitor weight and adjust furosemide  dosing to achieve a 6-pound weight loss. - Encourage potassium intake, such as eating bananas. - Schedule blood work on next Monday to monitor kidney function and electrolytes as well as BNP. - Order 2D echocardiogram to reassess cardiac function.

## 2024-05-17 NOTE — Assessment & Plan Note (Signed)
 She described 1 episode where she felt like her blood pressure dropped and she had near syncope.  For now we will see how she does with reduced dose of beta-blocker.

## 2024-05-17 NOTE — Assessment & Plan Note (Signed)
 CHA2DS2-VASc score is 5 she is on Eliquis .  She is also now on Plavix  for at least 6 months.  It is really hard to tell but I do not think she is having that many breakthrough spells of A-fib.  Would have a low threshold to hold Eliquis  if necessary while on her 6 months of Plavix .

## 2024-05-17 NOTE — Assessment & Plan Note (Signed)
 Tried statins once in the past.  No interested in retrying.  Understands the risks.

## 2024-05-17 NOTE — Patient Instructions (Addendum)
 Medication Instructions:   Changes Carvedilol    to 6.25 mg twice a day    Stop taking 12.5 mg tablets carvedilol     Weight yourself daily   Starting tomorrow take  2 tablets of 80 mg  ( 160 mg ) in the morning  and 80 mg in the evening for the next 3 days.  Alternate every other day   one day  80 mg twice a day then next day take 160 mg in the morning and 80 mg in the evening until you are down 6 lbs in weight.  Once you lost 6 lbs the go back to 80 mg twice a day      *If you need a refill on your cardiac medications before your next appointment, please call your pharmacy*   Lab Work: Monday  Sept 22  got [facility]  Come have labs done BMP PRO-BNP   If you have labs (blood work) drawn today and your tests are completely normal, you will receive your results only by: [employer] (if you have MyChart) OR A paper copy in the mail If you have any lab test that is abnormal or we need to change your treatment, we will call you to review the results.   Testing/Procedures: Your physician has requested that you have an echocardiogram. Echocardiography is a painless test that uses sound waves to create images of your heart. It provides your doctor with information about the size and shape of your heart and how well your heart's chambers and valves are working. This procedure takes approximately one hour. There are no restrictions for this procedure. Please do NOT wear cologne, perfume, aftershave, or lotions (deodorant is allowed). Please arrive 15 minutes prior to your appointment time.  Please note: We ask at that you not bring children with you during ultrasound (echo/ vascular) testing. Due to room size and safety concerns, children are not allowed in the ultrasound rooms during exams. Our front office staff cannot provide observation of children in our lobby area while testing is being conducted. An adult accompanying a patient to their appointment will only be allowed in the  ultrasound room at the discretion of the ultrasound technician under special circumstances. We apologize for any inconvenience.    Follow-Up: At [facility], you and your health needs are our priority.  As part of our continuing mission to provide you with exceptional heart care, we have created designated Provider Care Teams.  These Care Teams include your primary Cardiologist (physician) and Advanced Practice Providers (APPs -  Physician Assistants and Nurse Practitioners) who all work together to provide you with the care you need, when you need it.  We recommend signing up for the patient portal called MyChart.  Sign up information is provided on this After Visit Summary.  MyChart is used to connect with patients for Virtual Visits (Telemedicine).  Patients are able to view lab/test results, encounter notes, upcoming appointments, etc.  Non-urgent messages can be sent to your provider as well.   To learn more about what you can do with MyChart, go to [email or web address].    Your next appointment:   2 month(s)  The format for your next appointment:   In Person  Provider:   Alm Clay, MD

## 2024-05-17 NOTE — Assessment & Plan Note (Signed)
 I do not think she is actually having runs of A-fib I think she is just having palpitations.  She has PRN amiodarone  which in all that she will use.  I think were safe backing her carvedilol  dose down to 6.25 mg twice daily  Continue Eliquis  but low threshold to hold Eliquis  while on Plavix  if there is any issues of bleeding.

## 2024-05-17 NOTE — Assessment & Plan Note (Signed)
 Status post PPM.  Also using this is a monitor to interrogate for possible A-fib.  Has not had much.  Does not seem to be paced much.

## 2024-05-17 NOTE — Assessment & Plan Note (Signed)
 Chronic kidney disease with recent creatinine fluctuations. Improved creatinine but elevated BUN suggests possible volume overload. Fluid management needed to prevent kidney dysfunction exacerbation while managing heart failure. - Monitor kidney function with upcoming blood work. - Adjust fluid intake based on symptoms and weight changes.

## 2024-05-17 NOTE — Assessment & Plan Note (Signed)
 Post catheter she now has widely patent LAD and RCA but still complaining of symptoms of chest discomfort I suspect that this is probably more related to diastolic dysfunction and quite likely could have some microvascular component.  Will plan to potentially gradually convert from Imdur  to amlodipine versus considering Ranexa  Not on statin for reasons that we have discussed elsewhere.  Not interested.  - Plan is to reduce carvedilol  to 6.25 mg twice daily, increase continue losartan  for now but low threshold to convert to Entresto or increased to 100 mg -Now on Plavix  plus Eliquis .  Hopefully no bleeding issues.  If she were to have issues would hold Eliquis  for now.

## 2024-05-17 NOTE — Progress Notes (Signed)
 Cardiology Office Note:  .   Date:  05/17/2024  ID:  [name], DOB [birth year], MRN [id number] PCP: Tricia Garnette KIDD, MD  [facility] Providers Cardiologist:  Tricia Clay, MD Electrophysiologist:  Tricia Balding, MD     Chief Complaint  Patient presents with   Hospitalization Follow-up    Post cath follow-up.  Noting dyspnea   Congestive Heart Failure    Exertional dyspnea   Coronary Artery Disease    Recent PCI: Still having dyspnea but less prominent chest pain    Patient Profile: .     [name] is a obese 83 y.o. female with a PMH noted below who presents here for post cath follow-up with symptoms of exertional dyspnea.   She continues to follow-up with her PCP: Tricia Garnette KIDD, MD.  CV History  CAD - 07/2010 - Progressive Angina: BMS PCI RCA & LAD (pre-op Back Sgx)  03/2011 Unstable Angina => LAD proximal ISR -> Overlapping DES w/ PTCA of jailed D2 02/2012: Patent Stents -> 08/2012 (peri-Op MI) patent stents Most recent cath was July 2016: Widely patent LAD stents with D2 jailed.  Minimal D2 stenosis.  Proximal to mid RCA BMS with less than 10% ISR.  OM1 and D1 60% stenosis.-Stable.  Myoview  May 2021: No ischemia or infarct.  LOW RISK => has chronic intermittent severe chest pain which she calls angina Chronic HFpEF Chronic bilateral LE edema Celiac Artery Stenosis - s/p Stent (2018) Bilateral Renal Atery Stenosis -> s/p L RA Stent (2011) Paroxysmal atrial tachycardia -> last anywhere from 30 seconds to 10 minutes. Symptomatic bradycardia with sinus pauses, Mobitz 2 AVB => s/p dual-chamber PPM Paroxysmal atrial fibrillation -> has short bursts lasting just a few hours. HLD-statin myopathy.  Unwilling to try any cholesterol medication       [name] was last seen on April 02, 2024 to discuss results of her cardiac stress PET which suggested inferolateral ischemia.  She was having exertional dyspnea with minimal exertion associated with chest  discomfort and palpitations.  We scheduled her for cardiac catheterization on 04/20/2024.  No breakthrough spells of A-fib.  She underwent cardiac catheterization on 04/20/2024 which revealed stenoses on either side of her bare-metal stent in the RCA treated with 2 stents overlapping proximal and distal to the other stent.  Subjective  Discussed the use of AI scribe software for clinical note transcription with the patient, who gave verbal consent to proceed.  History of Present Illness [name] is an 83 year old female with coronary artery disease who presents with shortness of breath and fluid retention.  She has been experiencing significant shortness of breath over the past few days, particularly on Saturday and yesterday, to the point where she struggled to breathe while eating. She needs to sit down after walking short distances, such as from the kitchen to the bathroom, due to breathlessness. No waking up at night due to breathing difficulties, but she mentions sleeping during the day.  Two weeks ago, she experienced a significant drop in blood pressure to 86/36 and a decrease in oxygen levels, leading to a brief loss of consciousness. She recalls feeling as though she might not make it through the episode. Since then, she has been feeling worse, with increased difficulty breathing.  She reports having undergone procedures to open up her arteries, including stent placement. Her heart does not hurt as often as it did before the procedure, but she has experienced a couple of episodes of  chest pain since then, though not as severe as before.  She reports swelling, describing it as 'terrible' and likening her appearance to an 'elephant.' She has gained six pounds since her last visit, which she attributes to fluid retention. She is currently taking Eliquis  twice a day, and Plavix . She also mentions taking carvedilol  12.5 mg twice a day and furosemide  80 mg twice a day.  No frequent heart  palpitations or irregular heartbeats, noting that if she does feel her heart beating irregularly, it usually resolves within 15-20 minutes after sitting down. She has not noticed any significant changes in her heart rhythm recently.  She mentions receiving steroid shots in her shoulders, which have affected her sleep, making her feel 'wired.' She sleeps on two pillows and does not wake up due to breathing issues at night.  She has a history of pancreatitis, which was noted to be 'out of whack' during a previous hospital visit. Her creatinine levels have fluctuated, with a recent improvement noted.   Cardiovascular ROS: positive for - dyspnea on exertion, edema, palpitations, shortness of breath, and the chest pain and tightness that she was having has improved but she still has some every now and then.  As usual she thinks she has had a heart attack .  She had a near syncopal episode where her pressures dropped apparently into the 80s and she felt like she may have passed out.  Did not have any sensation of tachycardia associated with it. negative for - orthopnea, paroxysmal nocturnal dyspnea, rapid heart rate, or TIA/emesis fugax symptoms.  Not sure if she actually lost consciousness and that 1 episode probably more consistent with near syncope.  ROS:  Review of Systems - Negative except symptoms noted above.  She is in the process of dealing with significant bilateral shoulder pain for which she is getting steroid injections.  This makes it hard for her to sleep.    Objective   Current Medications: CV meds: Eliquis  5 mg twice daily, Plavix  75 mg daily; carvedilol  612.5 mg twice daily, losartan  50 mg daily, Imdur  30 mg, spironolactone  25 mg (Monday, Wednesday, Friday); furosemide  80 mg which she is taking twice daily with additional days doses as needed.;  She has an order for as needed amiodarone  for breakthrough spells of A-fib but has not used any. Indapamide  2.5 mg daily; as needed  NTG. Allopurinol  100 mg daily; vitamin B12 1000 mcg daily, Several as needed medications.  Studies Reviewed: SABRA   EKG Interpretation Date/Time:  Monday May 17 2024 16:20:44 EDT Ventricular Rate:  79 PR Interval:  152 QRS Duration:  94 QT Interval:  392 QTC Calculation: 449 R Axis:   47  Text Interpretation: Normal sinus rhythm Cannot rule out Anterior infarct , age undetermined When compared with ECG of 21-Apr-2024 05:21, Premature ventricular complexes are no longer Present Confirmed by Anner Lenis ([id number]) on 05/17/2024 5:00:46 PM    Results LABS Creatinine: 1.5 Creatinine: 1.85 (04/29/2024)  DIAGNOSTIC EKG: Normal Cardiac cath@-PCI (04/20/2024): Two-vessel CAD involving RCA and LAD.  Widely patent over lapping LAD stents (BMS and DES); ostial proximal RCA 60 to 70% followed by widely patent BMS stent and then segmental 80% stenosis distal to stent.  High bifurcation of RCA into a large RPAV-PL and PDA. LVEDP estimated 20 to 22 mmHg. Diagnostic:  Dominance: Co-dominant  Intervention:   Successful 2 site PCI of the RCA with a proximal and distal overlapping stent to the prior BMS:  3.0 mm x 20 mm Synergy XD DES overlapping distally covering the 80% stenosis (deployed to 3.3 mm);  3.0 mm x 16 mm Synergy XD DES overlapping the BMS proximally to the ostium deployed to 3.3 mm postdilated the ostium to 3.4 mm.     Risk Assessment/Calculations:    CHA2DS2-VASc Score = 5   This indicates a 7.2% annual risk of stroke. The patient's score is based upon: CHF History: 0 HTN History: 1 Diabetes History: 0 Stroke History: 0 Vascular Disease History: 1 Age Score: 2 Gender Score: 1            Physical Exam:   VS:  BP 133/75 (BP Location: Left Arm, Patient Position: Sitting, Cuff Size: Normal)   Pulse 79   Ht 5' 8 (1.727 m)   Wt 199 lb (90.3 kg)   SpO2 95%   BMI 30.26 kg/m    Wt Readings from Last 3 Encounters:  05/17/24  199 lb (90.3 kg)  05/13/24 198 lb (89.8 kg)  04/29/24 194 lb 6.4 oz (88.2 kg)      GEN: Well nourished, well groomed in no acute distress; mildly obese NECK: No JVD; No carotid bruits CARDIAC: RRR with occasional ectopy; normal S1, S2;  1/6 SEM at RUSB; otherwise no, rubs, gallops RESPIRATORY:  Clear to auscultation without rales, wheezing or rhonchi ; nonlabored, good air movement. ABDOMEN: Soft, non-tender, non-distended EXTREMITIES: Trivial to 1+ bilateral LE (L > R ) edema; No deformity      ASSESSMENT AND PLAN: .    Problem List Items Addressed This Visit       Cardiology Problems   CAD S/P percutaneous coronary angioplasty: pRCA BMS (now with DES overlapping proximal and distal), mLAD BMS overlapped prox with DES for ISR - Primary (Chronic)   Very successful results on PCI to the RCA.  Not unexpectedly, she continues to have unusual symptoms but now noting symptoms of dyspnea.  She had pretty significant dyspnea post cath likely related to elevated LVEDP.   She still has some chest pain pain, but notes that it is less severe than prior to stenting. No significant new stenosis, stress test negative for myocardial injury.  Symptoms likely due to diastolic dysfunction. - Decrease Imdur  to 30 mg daily to reserve higher doses for future angina management. -With possible fatigue and dyspnea related to troponin incompetence, will reduce carvedilol  dose to 6.25 mg twice daily and continue current dose of losartan  at 50 mg daily - Continue Plavix  for six months post-stenting as double therapy with Eliquis  5 mg daily      Relevant Medications   carvedilol  (COREG ) 6.25 MG tablet   furosemide  (LASIX ) 80 MG tablet   Other Relevant Orders   ECHOCARDIOGRAM COMPLETE   Pro b natriuretic peptide (BNP)   Basic metabolic panel with GFR   Congestive heart failure with LV diastolic dysfunction, NYHA class 2 (HCC) (Chronic)   Chronic diastolic heart failure with volume overload causing shortness  of breath and fatigue. Preserved heart function with diastolic dysfunction leading to fluid backup and pulmonary congestion. - Decrease carvedilol  to 6.25 mg twice daily; for now continue losartan  50 mg daily but low threshold to titrate further versus convert to Entresto -Continue spironolactone  3 days a week - Increase furosemide  to 160 mg daily (80 mg twice daily) for three days, then adjust based on weight loss. - Monitor  weight and adjust furosemide  dosing to achieve a 6-pound weight loss. - Encourage potassium intake, such as eating bananas. - Schedule blood work on next Monday to monitor kidney function and electrolytes as well as BNP. - Order 2D echocardiogram to reassess cardiac function.      Relevant Medications   carvedilol  (COREG ) 6.25 MG tablet   furosemide  (LASIX ) 80 MG tablet   Other Relevant Orders   ECHOCARDIOGRAM COMPLETE   Pro b natriuretic peptide (BNP)   Basic metabolic panel with GFR   Coronary artery disease involving native coronary artery of native heart with angina pectoris (HCC) (Chronic)   Post catheter she now has widely patent LAD and RCA but still complaining of symptoms of chest discomfort I suspect that this is probably more related to diastolic dysfunction and quite likely could have some microvascular component.  Will plan to potentially gradually convert from Imdur  to amlodipine versus considering Ranexa  Not on statin for reasons that we have discussed elsewhere.  Not interested.  - Plan is to reduce carvedilol  to 6.25 mg twice daily, increase continue losartan  for now but low threshold to convert to Entresto or increased to 100 mg -Now on Plavix  plus Eliquis .  Hopefully no bleeding issues.  If she were to have issues would hold Eliquis  for now.      Relevant Medications   carvedilol  (COREG ) 6.25 MG tablet   furosemide  (LASIX ) 80 MG tablet   Other Relevant Orders   EKG 12-Lead (Completed)   ECHOCARDIOGRAM COMPLETE   Pro b natriuretic peptide  (BNP)   Basic metabolic panel with GFR   Hypercoagulable state due to paroxysmal atrial fibrillation (HCC) (Chronic)   CHA2DS2-VASc score is 5 she is on Eliquis .  She is also now on Plavix  for at least 6 months.  It is really hard to tell but I do not think she is having that many breakthrough spells of A-fib.  Would have a low threshold to hold Eliquis  if necessary while on her 6 months of Plavix .      Relevant Medications   carvedilol  (COREG ) 6.25 MG tablet   furosemide  (LASIX ) 80 MG tablet   Hyperlipidemia associated with type 2 diabetes mellitus (HCC) (Chronic)   Intolerant to statins.  No plans to take anything for it.  Multiple discussions in the past. A1c is 6.2.  Well-controlled.  Not currently on any medications.      Relevant Medications   carvedilol  (COREG ) 6.25 MG tablet   furosemide  (LASIX ) 80 MG tablet   Mobitz type 2 second degree atrioventricular block (Chronic)   Status post PPM.  Also using this is a monitor to interrogate for possible A-fib.  Has not had much.  Does not seem to be paced much.      Relevant Medications   carvedilol  (COREG ) 6.25 MG tablet   furosemide  (LASIX ) 80 MG tablet   PAF (paroxysmal atrial fibrillation) (HCC) (Chronic)   I do not think she is actually having runs of A-fib I think she is just having palpitations.  She has PRN amiodarone  which in all that she will use.  I think were safe backing her carvedilol  dose down to 6.25 mg twice daily  Continue Eliquis  but low threshold to hold Eliquis  while on Plavix  if there is any issues of bleeding.      Relevant Medications   carvedilol  (COREG ) 6.25 MG tablet   furosemide  (LASIX ) 80 MG tablet   Other Relevant Orders   EKG 12-Lead (Completed)   ECHOCARDIOGRAM COMPLETE  Pro b natriuretic peptide (BNP)   Basic metabolic panel with GFR     Other   Chronic kidney disease (CKD), stage III (moderate) (HCC) (Chronic)   Chronic kidney disease with recent creatinine fluctuations. Improved  creatinine but elevated BUN suggests possible volume overload. Fluid management needed to prevent kidney dysfunction exacerbation while managing heart failure. - Monitor kidney function with upcoming blood work. - Adjust fluid intake based on symptoms and weight changes.      Exertional dyspnea (Chronic)   We have done ischemic evaluation and treatment and she still has exertional dyspnea.  I do not think that this is her anginal equivalent as it is highly unlikely that she would have noted improvement with PCI to significant RCA lesions.  I do think there is some component of diastolic dysfunction mediated symptoms.  - Check 2D echo and BNP level - Titrating up diuretic to hopefully achieve 5 to 6 pound weight loss. - Low threshold to consider switching from losartan  to [facility]  I think she would benefit from cardiac rehab, but I still think she will take the time to do it.      Relevant Orders   ECHOCARDIOGRAM COMPLETE   Pro b natriuretic peptide (BNP)   Basic metabolic panel with GFR   Kyphoscoliosis and scoliosis (Chronic)   Kyphoscoliosis with back pain and spinal osteophytes, high likelihood that this is reducing lung capacity and affecting breathing. - Encourage posture exercises to improve lung capacity. - Consider back and shoulder brace to improve posture.      Myalgia due to statin (Chronic)   Tried statins once in the past.  No interested in retrying.  Understands the risks.      Relevant Orders   ECHOCARDIOGRAM COMPLETE   Pro b natriuretic peptide (BNP)   Basic metabolic panel with GFR   Stasis edema of bilateral lower extremity (Chronic)   Relevant Orders   ECHOCARDIOGRAM COMPLETE   Pro b natriuretic peptide (BNP)   Basic metabolic panel with GFR   Syncope and collapse   She described 1 episode where she felt like her blood pressure dropped and she had near syncope.  For now we will see how she does with reduced dose of beta-blocker.      Other Visit  Diagnoses       Essential hypertension       Relevant Medications   carvedilol  (COREG ) 6.25 MG tablet   furosemide  (LASIX ) 80 MG tablet   Other Relevant Orders   EKG 12-Lead (Completed)   ECHOCARDIOGRAM COMPLETE   Pro b natriuretic peptide (BNP)   Basic metabolic panel with GFR          Cardiac Rehabilitation Eligibility Assessment      Not interested in this  Follow-Up: Return in about 2 months (around 07/16/2024) for To discuss test results, Routine Follow-up after testing ~ 1-2 months.  I spent 63 minutes in the care of [name] today including reviewing labs (2 minutes), reviewing studies (cardiac cath films reviewed-4 minutes), face to face time discussing treatment options (39 minutes), reviewing records from recent clinic note, cath reports, PCP notes and orders (5 minutes), 13 minutes dictating, and documenting in the encounter.      Signed, Tricia MICAEL Clay, MD, MS [name], M.D., M.S. Interventional Cardiologist  [facility] Pager # [phone]

## 2024-05-17 NOTE — Assessment & Plan Note (Signed)
 Kyphoscoliosis with back pain and spinal osteophytes, high likelihood that this is reducing lung capacity and affecting breathing. - Encourage posture exercises to improve lung capacity. - Consider back and shoulder brace to improve posture.

## 2024-05-17 NOTE — Assessment & Plan Note (Addendum)
 Intolerant to statins.  No plans to take anything for it.  Multiple discussions in the past. A1c is 6.2.  Well-controlled.  Not currently on any medications.

## 2024-05-24 DIAGNOSIS — I25119 Atherosclerotic heart disease of native coronary artery with unspecified angina pectoris: Secondary | ICD-10-CM | POA: Diagnosis not present

## 2024-05-24 DIAGNOSIS — I480 Paroxysmal atrial fibrillation: Secondary | ICD-10-CM | POA: Diagnosis not present

## 2024-05-24 DIAGNOSIS — I10 Essential (primary) hypertension: Secondary | ICD-10-CM | POA: Diagnosis not present

## 2024-05-24 DIAGNOSIS — I5030 Unspecified diastolic (congestive) heart failure: Secondary | ICD-10-CM | POA: Diagnosis not present

## 2024-05-24 DIAGNOSIS — Z9861 Coronary angioplasty status: Secondary | ICD-10-CM | POA: Diagnosis not present

## 2024-05-24 DIAGNOSIS — M791 Myalgia, unspecified site: Secondary | ICD-10-CM | POA: Diagnosis not present

## 2024-05-24 DIAGNOSIS — T466X5A Adverse effect of antihyperlipidemic and antiarteriosclerotic drugs, initial encounter: Secondary | ICD-10-CM | POA: Diagnosis not present

## 2024-05-24 DIAGNOSIS — I2510 Atherosclerotic heart disease of native coronary artery without angina pectoris: Secondary | ICD-10-CM | POA: Diagnosis not present

## 2024-05-24 DIAGNOSIS — R0609 Other forms of dyspnea: Secondary | ICD-10-CM | POA: Diagnosis not present

## 2024-05-24 DIAGNOSIS — I87302 Chronic venous hypertension (idiopathic) without complications of left lower extremity: Secondary | ICD-10-CM | POA: Diagnosis not present

## 2024-05-25 ENCOUNTER — Ambulatory Visit: Admitting: Family Medicine

## 2024-05-25 ENCOUNTER — Ambulatory Visit: Payer: Self-pay | Admitting: Cardiology

## 2024-05-25 LAB — BASIC METABOLIC PANEL WITH GFR
BUN/Creatinine Ratio: 33 — ABNORMAL HIGH (ref 12–28)
BUN: 47 mg/dL — ABNORMAL HIGH (ref 8–27)
CO2: 20 mmol/L (ref 20–29)
Calcium: 9.4 mg/dL (ref 8.7–10.3)
Chloride: 98 mmol/L (ref 96–106)
Creatinine, Ser: 1.43 mg/dL — ABNORMAL HIGH (ref 0.57–1.00)
Glucose: 120 mg/dL — ABNORMAL HIGH (ref 70–99)
Potassium: 4.4 mmol/L (ref 3.5–5.2)
Sodium: 135 mmol/L (ref 134–144)
eGFR: 37 mL/min/1.73 — ABNORMAL LOW (ref >59)

## 2024-05-25 LAB — PRO B NATRIURETIC PEPTIDE: NT-Pro BNP: 819 pg/mL — AB (ref 0–738)

## 2024-05-27 ENCOUNTER — Ambulatory Visit

## 2024-05-27 VITALS — Ht 68.0 in | Wt 199.0 lb

## 2024-05-27 DIAGNOSIS — Z0000 Encounter for general adult medical examination without abnormal findings: Secondary | ICD-10-CM | POA: Diagnosis not present

## 2024-05-27 NOTE — Patient Instructions (Signed)
 [name],  Thank you for taking the time for your Medicare Wellness Visit. I appreciate your continued commitment to your health goals. Please review the care plan we discussed, and feel free to reach out if I can assist you further.  Medicare recommends these wellness visits once per year to help you and your care team stay ahead of potential health issues. These visits are designed to focus on prevention, allowing your provider to concentrate on managing your acute and chronic conditions during your regular appointments.  Please note that Annual Wellness Visits do not include a physical exam. Some assessments may be limited, especially if the visit was conducted virtually. If needed, we may recommend a separate in-person follow-up with your provider.  Ongoing Care Seeing your primary care provider every 3 to 6 months helps us  monitor your health and provide consistent, personalized care.   Referrals If a referral was made during today's visit and you haven't received any updates within two weeks, please contact the referred provider directly to check on the status.  Recommended Screenings:  Health Maintenance  Topic Date Due   Eye exam for diabetics  Never done   Pneumococcal Vaccine for age over 84 (1 of 2 - PCV) Never done   Medicare Annual Wellness Visit  03/09/2024   Flu Shot  11/30/2024*   Hemoglobin A1C  08/20/2024   Yearly kidney health urinalysis for diabetes  11/12/2024   Complete foot exam   02/18/2025   Yearly kidney function blood test for diabetes  05/24/2025   DEXA scan (bone density measurement)  Completed   Zoster (Shingles) Vaccine  Completed   HPV Vaccine  Aged Out   Meningitis B Vaccine  Aged Out   DTaP/Tdap/Td vaccine  Discontinued   COVID-19 Vaccine  Discontinued   Hepatitis C Screening  Discontinued  *Topic was postponed. The date shown is not the original due date.       04/20/2024    3:00 PM  Advanced Directives  Does Patient Have a Medical Advance  Directive? No  Does patient want to make changes to medical advance directive? No - Patient declined  Would patient like information on creating a medical advance directive? No - Patient declined   Advance Care Planning is important because it: Ensures you receive medical care that aligns with your values, goals, and preferences. Provides guidance to your family and loved ones, reducing the emotional burden of decision-making during critical moments.  Vision: Annual vision screenings are recommended for early detection of glaucoma, cataracts, and diabetic retinopathy. These exams can also reveal signs of chronic conditions such as diabetes and high blood pressure.  Dental: Annual dental screenings help detect early signs of oral cancer, gum disease, and other conditions linked to overall health, including heart disease and diabetes.  Please see the attached documents for additional preventive care recommendations.

## 2024-05-27 NOTE — Progress Notes (Signed)
 Subjective:   [name] is a 83 y.o. who presents for a Medicare Wellness preventive visit.  As a reminder, Annual Wellness Visits don't include a physical exam, and some assessments may be limited, especially if this visit is performed virtually. We may recommend an in-person follow-up visit with your provider if needed.  Visit Complete: Virtual I connected with  [name] on 05/27/24 by a audio enabled telemedicine application and verified that I am speaking with the correct person using two identifiers.  Patient Location: Home  Provider Location: Office/Clinic  I discussed the limitations of evaluation and management by telemedicine. The patient expressed understanding and agreed to proceed.  Vital Signs: Because this visit was a virtual/telehealth visit, some criteria may be missing or patient reported. Any vitals not documented were not able to be obtained and vitals that have been documented are patient reported.  VideoDeclined- This patient declined Librarian, academic. Therefore the visit was completed with audio only.  Persons Participating in Visit: Patient.  AWV Questionnaire: No: Patient Medicare AWV questionnaire was not completed prior to this visit.  Cardiac Risk Factors include: advanced age (>35men, >50 women);diabetes mellitus;dyslipidemia;hypertension;obesity (BMI >30kg/m2)     Objective:    Today's Vitals   05/27/24 1542 05/27/24 1543  Weight: 199 lb (90.3 kg)   Height: 5' 8 (1.727 m)   PainSc:  10-Worst pain ever   Body mass index is 30.26 kg/m.     05/27/2024    3:49 PM 04/20/2024    3:00 PM 04/20/2024    7:44 AM 04/15/2024    3:00 PM 03/11/2024    4:14 PM 02/25/2024   12:21 PM 12/31/2023   12:14 PM  Advanced Directives  Does Patient Have a Medical Advance Directive? No No No No No No No  Does patient want to make changes to medical advance directive?  No - Patient declined No - Patient declined No - Patient  declined   No - Patient declined  Would patient like information on creating a medical advance directive? No - Patient declined No - Patient declined No - Patient declined No - Patient declined  No - Patient declined No - Patient declined    Current Medications (verified) Outpatient Encounter Medications as of 05/27/2024  Medication Sig   allopurinol  (ZYLOPRIM ) 100 MG tablet TAKE 1 TABLET BY MOUTH EVERY DAY   amiodarone  (PACERONE ) 200 MG tablet TAKE 400 MG (2 TABLETS) ONE DOSE AS NEED FOR A FAST HEART RATE ,IF AFTER 12 HOURS STILL PRESENT TAKE 200 MG ( 1 TABLET) ONE DOSE FOR EACH EPISODE   apixaban  (ELIQUIS ) 5 MG TABS tablet Take 1 tablet (5 mg total) by mouth 2 (two) times daily.   Artificial Tear Solution (TEARS RENEWED OP) Apply 1 drop to eye daily as needed (dry eyes).   bismuth  subsalicylate (PEPTO BISMOL) 262 MG/15ML suspension Take 30 mLs by mouth every 6 (six) hours as needed for diarrhea or loose stools.   carvedilol  (COREG ) 6.25 MG tablet Take 1 tablet (6.25 mg total) by mouth 2 (two) times daily.   clopidogrel  (PLAVIX ) 75 MG tablet Take 1 tablet (75 mg total) by mouth daily with breakfast.   colchicine 0.6 MG tablet Take 0.6 mg by mouth daily as needed (Gout).   cyanocobalamin  (VITAMIN B12) 1000 MCG tablet Take 1,000 mcg by mouth daily.   diclofenac sodium (VOLTAREN) 1 % GEL Apply 2 g topically 4 (four) times daily as needed (pain).   furosemide  (LASIX ) 80 MG tablet Take  1 tablet (80 mg total) by mouth 2 (two) times daily. May take a additional if weight is 3 lbs or more with daily weight   hyoscyamine  (ANASPAZ ) 0.125 MG TBDP disintergrating tablet Place 1 tablet (0.125 mg total) under the tongue every 6 (six) hours as needed (abdominal cramping).   isosorbide  mononitrate (IMDUR ) 60 MG 24 hr tablet Take 1 tablet (60 mg total) by mouth daily. May take an extra dose if you use Nitroglycerin   sublingual tablet for 2 days afterwards   lidocaine  (XYLOCAINE ) 5 % ointment Apply 1 application.  topically as needed.   losartan  (COZAAR ) 50 MG tablet TAKE 1 TABLET BY MOUTH EVERY DAY   meclizine  (ANTIVERT ) 12.5 MG tablet Take 1 tablet (12.5 mg total) by mouth 2 (two) times daily as needed for dizziness.   nitroGLYCERIN  (NITROSTAT ) 0.4 MG SL tablet DISSOLVE 1 TABLET UNDER THE TONGUE EVERY 5 MINUTES AS NEEDED FOR CHEST PAIN   nystatin  (MYCOSTATIN /NYSTOP ) powder Apply 1 Application topically 3 (three) times daily.   spironolactone  (ALDACTONE ) 25 MG tablet TAKE 1 TABLET BY MOUTH AS DIRECTED. TAKE 1 TABLET ON MONDAYS, WEDNESDAYS, AND FRIDAYS AT 6 PM.   indapamide  (LOZOL ) 2.5 MG tablet Take 2.5 mg by mouth daily. (Patient not taking: Reported on 05/27/2024)   solifenacin  (VESICARE ) 10 MG tablet Take 1 tablet (10 mg total) by mouth daily. (Patient not taking: Reported on 05/27/2024)   No facility-administered encounter medications on file as of 05/27/2024.    Allergies (verified) Codeine phosphate, Contrast media [iodinated contrast media], Statins, and Tramadol   History: Past Medical History:  Diagnosis Date   Adrenal adenoma    Arthritis    fingers, right shoulder (09/09/2017)   Atrial fibrillation (HCC) 12/2021   New diagnosis noted on pacemaker interrogation.   Back pain with radiation    Chronic Back Pain - mutliple surgeries (including tumor removal)   Bilateral edema of lower extremity    Chronic, likely related to venous stasis   Bradycardia    Pacemaker placed   CAD S/P percutaneous coronary angioplasty    a) LHC: 07/30/10. -- 3.0x67mm Integrity BMS to pRCA & 2.5x 15mm Integrity BMS mLAD(@ D2).  b) Class III-IV Angina 03/2011: LHC- ISR in LAD BMS -- prox overlapping Promus DES 2.5x47mm and PTCA of jailed D2 ostium-prox 80%. c) 02/03/12:  LHC- patent stents.  Jailed diagonal. with stable flow; d) Peri-OP NSTEMI 04/2012 - LHC in 12/'13 -    Celiac artery stenosis    12/2016 - STENT placement   Complication of anesthesia    used to wake up wild years ago (09/09/2017)   Congestive  heart failure with LV diastolic dysfunction, NYHA class 2 (HCC)    06/13/10:  last 2D echo-  EF >55%, Mild TR, Mod Conc LVH - Grade 1 diastolic dysfunction (abnormal relaxation) --> LVEDP on Cath 28 mmHg & mild 2nd Pulm HTN   Diet-controlled type 2 diabetes mellitus (HCC)    Diverticulitis of colon (without mention of hemorrhage)(562.11)    Diverticulosis    Dyslipidemia, goal LDL below 70    Intolerant to statins   GERD (gastroesophageal reflux disease)    Hepatitis ~ 1957   yellow jaundice (01/14/2017)   Hepatomegaly    Hiatal hernia    History of blood transfusion 04/2012   when I had a heart attack   History of kidney stones    I've got a stone embedded in one of my kidneys (09/09/2017)   History of radiation therapy    Left Knee -  03/05/23-04/30/23- Dr. Lynwood Nasuti   History of stomach ulcers years ago   Irritable bowel syndrome (IBS)    Labile essential hypertension    Partially related to RAS   Mesenteric artery stenosis    95% Celiac Artery - ostial, 20-30% SMA.  Bilateral Renal A: L RA stent patent, R RA 20-30% -- Conservative Management   Mobitz type 2 second degree AV block    Status post PPM placement   NSTEMI (non-ST elevated myocardial infarction) (HCC) 04/2012   Unclear the details, apparently this was postoperative from her back surgery that she was cleared for my last saw her in June.  Reportedly had stents placed   Pancreas divisum    On pancreatic enzyme   PAT (paroxysmal atrial tachycardia)    Renal artery stenosis 2011; 12/'13   a) Angiogram 02/03/12:  50-60%L RA stenosis, 40% R R Inferior artery; b) 12/'13: S/P L RA Stent ([facility]) 6.0 mm x 15 mm; c) Renal Duplex 10/2013: <60% L RA, <60 R RA, ~60% SMA & Celiac A.   S/P placement of cardiac pacemaker 07/2021   Medtronic   Stricture and stenosis of esophagus    Thyroid  nodule    Unspecified gastritis and gastroduodenitis without mention of hemorrhage    Past Surgical History:  Procedure Laterality  Date   APPENDECTOMY     BACK SURGERY     BREAST DUCTAL SYSTEM EXCISION     right   BREAST SURGERY Right    CARDIAC CATHETERIZATION N/A 03/16/2015   Procedure: Left Heart Cath and Coronary Angiography;  Surgeon: Alm LELON Clay, MD;  Location: [facility] INVASIVE CV LAB;  Service: Cardiovascular; Widely patent m-dLAD stents as wellas pRCA stent.  High LVEDP, small Diag & Om vessels with moderate stenosis    Cardiac Event Monitor  01/2017   Mostly NSR with occasional sinus tachycardia and rare bradycardia. No A. fib. No PND or PSVT. Rare PACs and PVCs.   CATARACT EXTRACTION W/ INTRAOCULAR LENS  IMPLANT, BILATERAL Bilateral    CHOLECYSTECTOMY OPEN     COLONOSCOPY  03/01/2009   normal    CORONARY ANGIOPLASTY WITH STENT PLACEMENT  07/30/2010   3.0x8mm Integrity BMS to RCA and 2.5x 15mm Integrity BMS LAD.     CORONARY ANGIOPLASTY WITH STENT PLACEMENT  03/18/2011   Cutting Balloon PTCA of D2 (jailed - 80% ostial stenosis), DES PCI of mid LAD ISR - > Promus DES 2.5 x 16 mm postdilated to 2.8 mm) covering the proximal portion of the previous stent   CORONARY STENT INTERVENTION N/A 04/20/2024   Procedure: CORONARY STENT INTERVENTION;  Surgeon: Clay Alm LELON, MD;  Location: [facility] INVASIVE CV LAB;  Service: CV:;; Successful 2 site PCI of the RCA with a proximal (Synergy XD 3.0 mm x 16 mm-->3.3 mm) and distal (Synergy XD 3.0 mm x 20 mm -> 3.3 mm)   DOBUTAMINE  STRESS ECHO  03/07/2015   [facility] (ordered for pre-op evaluation for EUS/ERCP --> abnormal EKG:  strreesss test shhoowwed 1 mm ST segment depressions downsloping. No wall motion abnormalities at peak exercise or at rest. Diastolic dysfunction was noted but normal systolic function - EF greater than 55%. No bouts of regurgitation or stenosis. Resting hypertension with exaggerated response   ESOPHAGOGASTRODUODENOSCOPY  04/08/2012   ESOPHAGOGASTRODUODENOSCOPY (EGD) WITH ESOPHAGEAL DILATION  X 2   FRACTURE SURGERY     HAMMER TOE SURGERY Bilateral    took bone  off the top of 2nd toe on each foot   HIP SURGERY Left  something to do w/my back   JOINT REPLACEMENT     KNEE SURGERY Left    had fluid drained off it a couple times   LEFT HEART CATH AND CORONARY ANGIOGRAPHY  07/30/2010   severe LAD-diagonal 80%, moderate to severe proximal RCA.  Mean PAP 15 mmHg.  PCWP mean 17 mmHg.  RVP 45/11 mmHg.  PAP 47/24 mmHg, mean 31 mmHg.  LVEDP 27 mmHg   LEFT HEART CATH AND CORONARY ANGIOGRAPHY N/A 04/20/2024   Procedure: LEFT HEART CATH AND CORONARY ANGIOGRAPHY;  Surgeon: Anner Alm ORN, MD;  Location: [facility] INVASIVE CV LAB;  Service: CV: wo-vessel CAD involving RCA and LAD.  Widely patent over lapping LAD stents (BMS and DES); ostial proximal RCA 60 to 70% followed by widely patent BMS stent & segmental 80% stenosis distal to stent.  High bifurcation of RCA into a large RPAV-PL &PDA. LVEDP ~20 -22 mmHg.   LEFT HEART CATHETERIZATION WITH CORONARY ANGIOGRAM N/A 02/03/2012   Procedure: LEFT HEART CATHETERIZATION WITH CORONARY ANGIOGRAM;  Surgeon: Alm ORN Anner, MD;  Location: [facility] CATH LAB; widely patent LAD and RCA stents.  Patent D2 ostium.  Moderate L Renal A stenosis (56%), R Renal A 40-50% t.  LVEDP 20 mmHg.   LEFT HEART CATHETERIZATION WITH CORONARY ANGIOGRAM N/A 05/12/2014   Procedure: LEFT HEART CATHETERIZATION WITH CORONARY ANGIOGRAM;  Surgeon: Dorn JINNY Lesches, MD;  Location: [facility] CATH LAB;  Service: Cardiovascular: Stable CAD. Patent stents. Patent renal artery stent   LEFT HEART CATHETERIZATION WITH CORONARY ANGIOGRAM  08/2012   Peri-Op MI @ [facility] -- 40% ostial D1, patent LAD stents, 10% RCA ISR   LEFT HEART CATHETERIZATION WITH CORONARY ANGIOGRAM   03/18/2011   70% ISR of LAD stent just after D2 (D2 has ostial 80 to 90% stenosis.  20 to 30% ISR RCA.  EDP elevated at 28 mmHg   NM MYOVIEW  LTD  12/2016   LOW RISK study. No ischemia or infarction. EF 65-75%.   NM MYOVIEW  LTD  01/05/2020    LOW RISK. EF 60-65%.  No EKG changes.  No infarct, no  ischemia.   PACEMAKER IMPLANT N/A 07/09/2021   Procedure: PACEMAKER IMPLANT;  Surgeon: Francyne Headland, MD;  Location: [facility] INVASIVE CV LAB;  Service: Cardiovascular;  Laterality: N/A;   PANCREAS SURGERY     stent in my pancreas; Dr Essie Minor   PERCUTANEOUS PINNING PHALANX FRACTURE OF HAND Left ~ 2013   PERIPHERAL VASCULAR BALLOON ANGIOPLASTY  09/09/2017   Procedure: PERIPHERAL VASCULAR BALLOON ANGIOPLASTY;  Surgeon: Serene Gaile ORN, MD;  Location: [facility] INVASIVE CV LAB;  Service: Cardiovascular;;  Celiac instent   PERIPHERAL VASCULAR INTERVENTION  01/14/2017   Procedure: Peripheral Vascular Intervention;  Surgeon: Serene Gaile ORN, MD;  Location: [facility] INVASIVE CV LAB;  Service: Cardiovascular;;  mesentric   RENAL ARTERY STENT Left 08/2012   @ [facility] - 6.0 mm x 15 mm   SPINE SURGERY     tumor removed 07/2010; Redo Surgery 04/2012; Sacroiliac Sgx 08/2013   TOTAL ABDOMINAL HYSTERECTOMY     TOTAL KNEE ARTHROPLASTY Left ~ 2012   TRANSTHORACIC ECHOCARDIOGRAM  07/10/2021   (Postop PPM): Normal LV size and function.  EF 60 to 65%.  No RWMA.  GR 1 DD.  Mild LA dilation.  Normal RV size and function.  Normal RAP.  Normal aortic and mitral valves.  No pericardial effusion.   VISCERAL ANGIOGRAM N/A 05/12/2014   Procedure: VISCERAL ANGIOGRAM;  Surgeon: Dorn JINNY Lesches, MD;  Location: [facility] CATH LAB;  25% ostial proximal celiac artery with downward takeoff.  23% proximal SMA and 56% proximal IMA.  Left renal artery stent widely patent.  Right renal artery is 20 to 30% proximal stenosis.   VISCERAL ANGIOGRAPHY N/A 01/14/2017   Procedure: Mesenteric  Angiography;  Surgeon: Serene Gaile ORN, MD;  Location: [facility] INVASIVE CV LAB;  Service: Cardiovascular;  Laterality: N/A;   VISCERAL ANGIOGRAPHY N/A 09/09/2017   Procedure: VISCERAL ANGIOGRAPHY;  Surgeon: Serene Gaile ORN, MD;  Location: [facility] INVASIVE CV LAB;  Service: Cardiovascular;  Laterality: N/A;   Family History  Problem Relation Age of Onset    Stroke Mother    Cancer Father        mets   Heart attack Father    Heart disease Father    Heart attack Brother    Diabetes Brother    Heart attack Brother    Colitis Maternal Grandfather    Cancer Daughter    Colon cancer Neg Hx    Stomach cancer Neg Hx    Social History   Socioeconomic History   Marital status: Married    Spouse name: Not on file   Number of children: 1   Years of education: Not on file   Highest education level: Not on file  Occupational History   Occupation: Hotel manager    Employer: Production assistant, radio FOR SELF EMPLOYED    Comment: She takes cakes and pies to sell, also canned vegetables and makes [place] enjoys.  Tobacco Use   Smoking status: Never   Smokeless tobacco: Never  Vaping Use   Vaping status: Never Used  Substance and Sexual Activity   Alcohol use: No   Drug use: No   Sexual activity: Not Currently  Other Topics Concern   Not on file  Social History Narrative   Married 63 years in 2023. 1 child Conne in [place].  1 grandchild in [place].       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       Hobbies: cooking/baking, loves to feed people   Social Drivers of Health   Financial Resource Strain: Low Risk  (05/27/2024)   Overall Financial Resource Strain (CARDIA)    Difficulty of Paying Living Expenses: Not hard at all  Food Insecurity: No Food Insecurity (05/27/2024)   Hunger Vital Sign    Worried About Running Out of Food in the Last Year: Never true    Ran Out of Food in the Last Year: Never true  Transportation Needs: No Transportation Needs (05/27/2024)   PRAPARE - Administrator, Civil Service (Medical): No    Lack of Transportation (Non-Medical): No  Physical Activity: Inactive (05/27/2024)   Exercise Vital Sign    Days of Exercise per Week: 0 days    Minutes of  Exercise per Session: 0 min  Stress: No Stress Concern Present (05/27/2024)   [employer] of Occupational Health - Occupational Stress Questionnaire    Feeling of Stress: Not at all  Social Connections: Socially Integrated (05/27/2024)   Social Connection and Isolation Panel    Frequency of Communication with Friends and Family: More than three times a week    Frequency of Social Gatherings with Friends and Family: More than three times a week    Attends Religious Services: More than 4 times per year  Active Member of Clubs or Organizations: Yes    Attends Banker Meetings: 1 to 4 times per year    Marital Status: Married    Tobacco Counseling Counseling given: Not Answered    Clinical Intake:  Pre-visit preparation completed: Yes  Pain : 0-10 Pain Score: 10-Worst pain ever Pain Type: Chronic pain Pain Location: Rib cage Pain Orientation: Left Pain Descriptors / Indicators: Aching Pain Onset: More than a month ago     BMI - recorded: 30.26 Nutritional Status: BMI > 30  Obese Diabetes: Yes CBG done?: No Did pt. bring in CBG monitor from home?: No  Lab Results  Component Value Date   HGBA1C 6.2 02/19/2024   HGBA1C 5.9 (A) 11/13/2023   HGBA1C 6.0 (A) 08/07/2023     How often do you need to have someone help you when you read instructions, pamphlets, or other written materials from your doctor or pharmacy?: 1 - Never  Interpreter Needed?: No  Information entered by :: Ellouise Haws, LPN   Activities of Daily Living      05/27/2024    3:45 PM 04/20/2024    3:00 PM  In your present state of health, do you have any difficulty performing the following activities:  Hearing? 0 0  Vision? 0 0  Difficulty concentrating or making decisions? 0 0  Walking or climbing stairs? 1   Comment SOB   Dressing or bathing? 0   Doing errands, shopping? 0 0  Preparing Food and eating ? N   Using the Toilet? N   In the past six months, have you accidently  leaked urine? N   Do you have problems with loss of bowel control? N   Managing your Medications? N   Managing your Finances? N   Housekeeping or managing your Housekeeping? N     Patient Care Team: Katrinka Garnette KIDD, MD as PCP - General (Family Medicine) Anner Alm ORN, MD as PCP - Cardiology (Cardiology) Croitoru, Jerel, MD as PCP - Electrophysiology (Cardiology) Pyrtle, Gordy HERO, MD as Consulting Physician (Gastroenterology) Timmy Maude SAUNDERS, MD as Medical Oncologist (Oncology)   I have updated your Care Teams any recent Medical Services you may have received from other providers in the past year.     Assessment:   This is a routine wellness examination for [name].  Hearing/Vision screen Hearing Screening - Comments:: Pt denies any hearing issues  Vision Screening - Comments:: Wears rx glasses - up to date with routine eye exams with Dr Renate    Goals Addressed             This Visit's Progress    Patient Stated       Maintain health and activity        Depression Screen      05/27/2024    3:46 PM 04/15/2024    9:58 AM 02/25/2024   12:31 PM 05/01/2023    2:51 PM 03/10/2023   11:14 AM 02/19/2023    2:37 PM 10/17/2022    3:25 PM  PHQ 2/9 Scores  PHQ - 2 Score 0 0 0 0 0 0 0  PHQ- 9 Score 0   0 0  3    Fall Risk      05/27/2024    3:49 PM 05/01/2023    2:50 PM 03/10/2023   11:17 AM 10/17/2022    3:25 PM 02/26/2022   10:25 AM  Fall Risk   Falls in the past year? 1 1 0 1 1  Number falls in past yr: 1 0 0 1 1  Injury with Fall? 0 0 0 1 0  Risk for fall due to : History of fall(s);Impaired balance/gait;Impaired mobility No Fall Risks Impaired vision History of fall(s) Impaired balance/gait  Follow up Falls prevention discussed Falls evaluation completed Falls prevention discussed Falls evaluation completed Falls prevention discussed      Data saved with a previous flowsheet row definition    MEDICARE RISK AT HOME:   Medicare Risk at Home Any stairs in or around  the home?: Yes If so, are there any without handrails?: No Home free of loose throw rugs in walkways, pet beds, electrical cords, etc?: Yes Adequate lighting in your home to reduce risk of falls?: Yes Life alert?: No Use of a cane, walker or w/c?: Yes (at times) Grab bars in the bathroom?: No Shower chair or bench in shower?: No Elevated toilet seat or a handicapped toilet?: No  TIMED UP AND GO:  Was the test performed?  No  Cognitive Function: 6CIT completed        05/27/2024    3:49 PM 03/10/2023   11:18 AM 02/26/2022   10:28 AM  6CIT Screen  What Year? 0 points 0 points 0 points  What month? 0 points 0 points 0 points  What time? 0 points 0 points 0 points  Count back from 20 0 points 0 points 0 points  Months in reverse 0 points 0 points 0 points  Repeat phrase 0 points 0 points 0 points  Total Score 0 points 0 points 0 points    Immunizations Immunization History  Administered Date(s) Administered   Tdap 03/19/2012   Zoster Recombinant(Shingrix ) 06/03/2022, 09/05/2022   Zoster, Live 03/31/2009    Screening Tests Health Maintenance  Topic Date Due   OPHTHALMOLOGY EXAM  Never done   Pneumococcal Vaccine: 50+ Years (1 of 2 - PCV) Never done   Influenza Vaccine  11/30/2024 (Originally 04/02/2024)   HEMOGLOBIN A1C  08/20/2024   Diabetic kidney evaluation - Urine ACR  11/12/2024   FOOT EXAM  02/18/2025   Diabetic kidney evaluation - eGFR measurement  05/24/2025   Medicare Annual Wellness (AWV)  05/27/2025   DEXA SCAN  Completed   Zoster Vaccines- Shingrix   Completed   HPV VACCINES  Aged Out   Meningococcal B Vaccine  Aged Out   DTaP/Tdap/Td  Discontinued   COVID-19 Vaccine  Discontinued   Hepatitis C Screening  Discontinued    Health Maintenance Items Addressed: See Nurse Notes at the end of this note  Additional Screening:  Vision Screening: Recommended annual ophthalmology exams for early detection of glaucoma and other disorders of the eye. Is the patient  up to date with their annual eye exam?  Yes  Who is the provider or what is the name of the office in which the patient attends annual eye exams? Dr Renate  Dental Screening: Recommended annual dental exams for proper oral hygiene  Community Resource Referral / Chronic Care Management: CRR required this visit?  No   CCM required this visit?  No   Plan:    I have personally reviewed and noted the following in the patient's chart:   Medical and social history Use of alcohol, tobacco or illicit drugs  Current medications and supplements including opioid prescriptions. Patient is not currently taking opioid prescriptions. Functional ability and status Nutritional status Physical activity Advanced directives List of other physicians Hospitalizations, surgeries, and ER visits in previous 12 months Vitals Screenings to include  cognitive, depression, and falls Referrals and appointments  In addition, I have reviewed and discussed with patient certain preventive protocols, quality metrics, and best practice recommendations. A written personalized care plan for preventive services as well as general preventive health recommendations were provided to patient.   Ellouise VEAR Haws, LPN   [id number]   After Visit Summary: (MyChart) Due to this being a telephonic visit, the after visit summary with patients personalized plan was offered to patient via MyChart   Notes: Nothing significant to report at this time.

## 2024-05-27 NOTE — Telephone Encounter (Signed)
 The patient has been notified of the result and verbalized understanding.  All questions (if any) were answered. Gladis Reena GAILS, RN 05/27/2024 4:24 PM

## 2024-05-29 ENCOUNTER — Other Ambulatory Visit: Payer: Self-pay | Admitting: Cardiology

## 2024-06-01 ENCOUNTER — Ambulatory Visit (INDEPENDENT_AMBULATORY_CARE_PROVIDER_SITE_OTHER): Admitting: Family Medicine

## 2024-06-01 ENCOUNTER — Encounter: Payer: Self-pay | Admitting: Family Medicine

## 2024-06-01 VITALS — BP 118/74 | HR 78 | Temp 97.5°F | Ht 68.0 in | Wt 190.4 lb

## 2024-06-01 DIAGNOSIS — R58 Hemorrhage, not elsewhere classified: Secondary | ICD-10-CM

## 2024-06-01 DIAGNOSIS — D649 Anemia, unspecified: Secondary | ICD-10-CM | POA: Diagnosis not present

## 2024-06-01 DIAGNOSIS — K861 Other chronic pancreatitis: Secondary | ICD-10-CM | POA: Diagnosis not present

## 2024-06-01 DIAGNOSIS — E1142 Type 2 diabetes mellitus with diabetic polyneuropathy: Secondary | ICD-10-CM

## 2024-06-01 DIAGNOSIS — R310 Gross hematuria: Secondary | ICD-10-CM

## 2024-06-01 MED ORDER — HYDROCODONE-ACETAMINOPHEN 5-325 MG PO TABS: 1.0000 | ORAL_TABLET | Freq: Four times a day (QID) | ORAL | 0 refills | Status: DC | PRN

## 2024-06-01 NOTE — Patient Instructions (Addendum)
 Please stop by lab before you go If you have mychart- we will send your results within 3 business days of us  receiving them.  If you do not have mychart- we will call you about results within 5 business days of us  receiving them.  *please also note that you will see labs on mychart as soon as they post. I will later go in and write notes on them- will say notes from Dr. Katrinka   Sorry you are not feeling welel- hoping for answers with MRI  Also rechecking pancreas- if up again may be worth seeing gastroenterology again   Recommended follow up: Return in about 6 weeks (around 07/13/2024) for followup or sooner if needed.Schedule b4 you leave.

## 2024-06-01 NOTE — Progress Notes (Signed)
 Phone [phone] In person visit   Subjective:   [name] is a 83 y.o. year old very pleasant female patient who presents for/with See problem oriented charting Chief Complaint  Patient presents with   Medical Management of Chronic Issues    14 week follow up; abdominal and left flank pain;    Vaginal Bleeding    Possible vaginal or rectal bleeding not sure of origin, last known blood 1 week ago;    Discussed the use of AI scribe software for clinical note transcription with the patient, who gave verbal consent to proceed.  History of Present Illness   An 83 year old female with chronic bilateral rib pain and coronary artery disease who presents for follow-up of multiple issues.  She has chronic bilateral rib pain, more pronounced on the left side, with associated epigastric and midline throat pain. Previous thoracic spine x-rays showed arthritic changes, and an MRI of the thoracic spine is scheduled for October 20th.  She has a history of coronary artery disease with recent stenting in the right coronary artery. Post-stent placement, her chest pain and shortness of breath have improved. Her current medications include Imdur  30 mg daily, carvedilol  6.25 mg twice daily, losartan , Plavix , and Eliquis . She is not on statin therapy-she has declined multiple times.  She experiences chronic abdominal pain and has a history of biliary dilation that has been stable-she has also had recurrent pancreatitis at times though lipase not always elevated. Her lipase levels were elevated a month ago but have since decreased. She experiences diarrhea but has declined gastroenterology follow-up. She has a history of prior pancreatic stent placement.  She uses sparing hydrocodone  for this abdominal pain and rib pain  She had a significant bleeding episode three weeks ago, with a large amount of blood on a pad and in the toilet, possibly from the urine or stool-she did not note any vaginal blood but  that is also a possibility.. She did not have a bowel movement during the episode and has baseline incontinence. She frequently experiences diarrhea without blood at her baseline.  For CHF-Her medication regimen includes furosemide  80 mg three times daily. She has lost nine pounds since her last visit. She also takes spironolactone  three days a week which may help balance the potassium. She experiences frequent urination, especially at night.  She experiences chronic fatigue and shortness of breath but shortness of breath has improved with diuresis. Her medications include allopurinol  for gout, amiodarone  as needed for atrial fibrillation, and colchicine as needed for gout. She has not been using hyoscyamine  for abdominal cramping recently.     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    Priority: High   Osteoporosis 01/22/2022    Priority: High   Pacemaker 07/09/2021    Priority: High   Mobitz type 2 second degree atrioventricular block 06/20/2021    Priority: High   PAT (paroxysmal atrial tachycardia) 06/20/2021    Priority: High   PAF (paroxysmal atrial fibrillation) (HCC) 10/16/2020    Priority: High   PAD (peripheral artery disease) 01/14/2017    Priority: High   Type 2 diabetes mellitus with peripheral neuropathy (HCC) 10/09/2016    Priority: High   Mesenteric artery stenosis (HCC) 04/20/2014    Priority: High   Abdominal pain, epigastric 01/19/2014    Priority: High   Congestive heart failure with LV diastolic dysfunction, NYHA class 2 (HCC)     Priority:  High   Celiac artery stenosis: 60% by Duplex - 90-95% by cath - Med Rx 11/27/2013    Priority: High   Renal artery stenosis 08/29/2012    Priority: High   Coronary artery disease involving native coronary artery of native heart with angina pectoris 10/30/2011    Priority: High   CAD S/P percutaneous coronary angioplasty: pRCA BMS (now with DES  overlapping proximal and distal), mLAD BMS overlapped prox with DES for ISR 07/30/2010    Priority: High   B12 deficiency 01/10/2022    Priority: Medium    Continuous severe abdominal pain 10/11/2016    Priority: Medium    Exertional dyspnea 03/16/2015    Priority: Medium    Idiopathic chronic pancreatitis suspected 03/10/2014    Priority: Medium    Abnormal CT of liver-possible cirrhosis 03/10/2014    Priority: Medium    Stasis edema of bilateral lower extremity     Priority: Medium    Labile hypertension     Priority: Medium    Hyperlipidemia associated with type 2 diabetes mellitus (HCC)     Priority: Medium    Myalgia due to statin 12/29/2011    Priority: Medium    GERD (gastroesophageal reflux disease) 01/24/2009    Priority: Medium    Hypothyroid 11/12/2007    Priority: Medium    Irritable bowel syndrome 11/12/2007    Priority: Medium    PANCREAS DIVISUM 11/12/2007    Priority: Medium    Allergic rhinitis 01/10/2022    Priority: Low   Pneumothorax 07/10/2021    Priority: Low   Syncope and collapse 06/20/2021    Priority: Low   Hallux valgus (acquired), right foot 08/17/2018    Priority: Low   Costochondritis 01/21/2017    Priority: Low   Onychomycosis due to dermatophyte 11/16/2015    Priority: Low   Abnormal pharmacologic myocardial perfusion study 03/15/2015    Priority: Low   Dysphagia 03/10/2014    Priority: Low   Carotid stenosis 02/27/2014    Priority: Low   Leg cramps 01/19/2014    Priority: Low   Palpitations 01/12/2014    Priority: Low   Diarrhea due to malabsorption 01/24/2009    Priority: Low   Nephrolithiasis 11/12/2007    Priority: Low   HIATAL HERNIA 11/20/2005    Priority: Low   Kyphoscoliosis and scoliosis 05/17/2024   Chronic kidney disease (CKD), stage III (moderate) (HCC) 05/17/2024   Atypical angina 04/02/2024   Gross hematuria 11/19/2023   Hypercoagulable state due to paroxysmal atrial fibrillation (HCC) 10/01/2023   Nodular  fasciitis 12/20/2022    Medications- reviewed and updated Current Outpatient Medications  Medication Sig Dispense Refill   allopurinol  (ZYLOPRIM ) 100 MG tablet TAKE 1 TABLET BY MOUTH EVERY DAY 90 tablet 2   amiodarone  (PACERONE ) 200 MG tablet TAKE 400 MG (2 TABLETS) ONE DOSE AS NEED FOR A FAST HEART RATE ,IF AFTER 12 HOURS STILL PRESENT TAKE 200 MG ( 1 TABLET) ONE DOSE FOR EACH EPISODE 180 tablet 1   apixaban  (ELIQUIS ) 5 MG TABS tablet Take 1 tablet (5 mg total) by mouth 2 (two) times daily. 180 tablet 3   Artificial Tear Solution (TEARS RENEWED OP) Apply 1 drop to eye daily as needed (dry eyes).     bismuth  subsalicylate (PEPTO BISMOL) 262 MG/15ML suspension Take 30 mLs by mouth every 6 (six) hours as needed for diarrhea or loose stools.     carvedilol  (COREG ) 6.25 MG tablet Take 1 tablet (6.25 mg total) by mouth 2 (two) times  daily. 180 tablet 3   clopidogrel  (PLAVIX ) 75 MG tablet Take 1 tablet (75 mg total) by mouth daily with breakfast. 90 tablet 1   colchicine 0.6 MG tablet Take 0.6 mg by mouth daily as needed (Gout).     cyanocobalamin  (VITAMIN B12) 1000 MCG tablet Take 1,000 mcg by mouth daily.     diclofenac sodium (VOLTAREN) 1 % GEL Apply 2 g topically 4 (four) times daily as needed (pain).     furosemide  (LASIX ) 80 MG tablet Take 1 tablet (80 mg total) by mouth 2 (two) times daily. May take a additional if weight is 3 lbs or more with daily weight (Patient taking differently: Take 80 mg by mouth in the morning, at noon, and at bedtime. May take a additional if weight is 3 lbs or more with daily weight) 200 tablet 3   hyoscyamine  (ANASPAZ ) 0.125 MG TBDP disintergrating tablet Place 1 tablet (0.125 mg total) under the tongue every 6 (six) hours as needed (abdominal cramping). 30 tablet 5   indapamide  (LOZOL ) 2.5 MG tablet Take 1 tablet (2.5 mg total) by mouth daily. 90 tablet 3   isosorbide  mononitrate (IMDUR ) 60 MG 24 hr tablet Take 1 tablet (60 mg total) by mouth daily. May take an extra  dose if you use Nitroglycerin   sublingual tablet for 2 days afterwards 100 tablet 3   lidocaine  (XYLOCAINE ) 5 % ointment Apply 1 application. topically as needed. 35.44 g 0   losartan  (COZAAR ) 50 MG tablet TAKE 1 TABLET BY MOUTH EVERY DAY 90 tablet 3   meclizine  (ANTIVERT ) 12.5 MG tablet Take 1 tablet (12.5 mg total) by mouth 2 (two) times daily as needed for dizziness. 60 tablet 2   nitroGLYCERIN  (NITROSTAT ) 0.4 MG SL tablet DISSOLVE 1 TABLET UNDER THE TONGUE EVERY 5 MINUTES AS NEEDED FOR CHEST PAIN 25 tablet 2   nystatin  (MYCOSTATIN /NYSTOP ) powder Apply 1 Application topically 3 (three) times daily. 30 g 2   spironolactone  (ALDACTONE ) 25 MG tablet TAKE 1 TABLET BY MOUTH AS DIRECTED. TAKE 1 TABLET ON MONDAYS, WEDNESDAYS, AND FRIDAYS AT 6 PM. 45 tablet 1   HYDROcodone -acetaminophen  (NORCO/VICODIN) 5-325 MG tablet Take 1 tablet by mouth every 6 (six) hours as needed for moderate pain (pain score 4-6). 30 tablet 0   No current facility-administered medications for this visit.     Objective:  BP 118/74 (BP Location: Left Arm, Patient Position: Sitting, Cuff Size: Normal)   Pulse 78   Temp (!) 97.5 F (36.4 C) (Temporal)   Ht 5' 8 (1.727 m)   Wt 190 lb 6.4 oz (86.4 kg)   SpO2 94%   BMI 28.95 kg/m  Gen: NAD, resting comfortably CV: RRR no murmurs rubs or gallops Lungs: CTAB no crackles, wheeze, rhonchi Abdomen: very tender along left lateral ribs and into back. Also tender epigastric but not as severe and tender over right ribs. Some diffuse abdominal pain but not as bad as epgiastric area Ext: trace to 1+ edema Skin: warm, dry    Assessment and Plan      #Chronic left-sided rib and epigastric pain Chronic bilateral rib pain, worse on the left, with associated epigastric and back pain. Previous thoracic spine x-rays showed arthritic changes. Differential includes radicular pain and possible vascular issues, but CT angiogram was deferred due to kidney function concerns last visit and  desire to see if any solutions on MRI thoracic spine - Await MRI of thoracic spine on October 20th - Consider CT angiogram if MRI does not provide answers  and kidney function allows - Refill hydrocodone  for pain management- also to help with pancreatitis  #Chronic pancreatitis Chronic pancreatitis with fluctuating lipase levels, severe pain in the upper abdomen and she is concerned about recurrence of the pancreatitis-also reports nausea, and diarrhea. History of biliary dilation that has been stable. Previous GI evaluation suggested chronic pain management with Elavil  and we have considered abdominal migraine but elevated lipase pointing away from this. - Order lipase and amylase tests - Consider GI referral if lipase is elevated again but she may defer this  #Gross hematuria? Vs blood in stool? Significant bleeding episode with blood observed on a pad and in the toilet, possibly from the urinary tract about a week ago. On Plavix  and Eliquis , increasing the risk of bleeding. Differential includes urinary, gastrointestinal, or gynecological sources. - Order urine analysis to check for blood - Order stool occult blood test -Check CBC to make sure no significant blood loss-glad this has resolved and if recurrent should seek care - He sees Dr. Roseann and encouraged her to schedule follow-up given potential gross hematuria -For now continue Eliquis  and Plavix  though may have to reconsider  #Diarrhea Chronic diarrhea with no visible blood reported in stools. Previous GI evaluation was declined. - Consider GI referral if symptoms persist or worsen  #Heart failure with preserved ejection fraction (HFpEF) HFpEF with recent adjustments in diuretic therapy. Furosemide  increased to 80 mg three times daily to manage fluid overload, with a goal of weight loss. Achieved a 9-pound weight loss. Shortness of breath is improving. - Adjust furosemide  to 80 mg twice daily, with an option for a third dose if  weight increases by 3 pounds or more.  Reviewed cardiology's note and this seems consistent with their recommendations.  Plus check potassium with increased Lasix  recently though spironolactone  25 mg 3 days a week may counteract this - Monitor weight and symptoms of fluid overload  #Coronary artery disease, status post RCA stenting Status post RCA stenting with improved chest pain. On dual antiplatelet therapy with Plavix  and Eliquis . Not interested in statin therapy despite recommendations. - Continue Plavix  and Eliquis  - Monitor for chest pain and other symptoms  #Atrial fibrillation Atrial fibrillation managed with carvedilol  and Eliquis . Also on amiodarone  as needed. - Continue carvedilol  6.25 mg twice daily-rate controlled - Continue Eliquis  5 mg-appropriately anticoagulated - Use amiodarone  as needed  #Chronic kidney disease, stage 3 Chronic kidney disease with recent mild improvement in renal function. Creatinine was 1.43 eight days ago. CT angiogram deferred due to concerns about kidney function. - Monitor renal function - Avoid nephrotoxic procedures unless necessary   # Type 2 diabetes -Thankfully has been diet controlled-update A1c hoping for stability Lab Results  Component Value Date   HGBA1C 6.2 02/19/2024   HGBA1C 5.9 (A) 11/13/2023   HGBA1C 6.0 (A) 08/07/2023      Recommended follow up: Return in about 6 weeks (around 07/13/2024) for followup or sooner if needed.Schedule b4 you leave. Future Appointments  Date Time Provider Department Center  06/10/2024 10:15 AM Shannon Agent, MD [facility] None  06/15/2024  3:00 PM [facility] 3 [facility] H&V  06/17/2024 10:30 AM [facility] None  06/17/2024 10:45 AM Timmy, Maude SAUNDERS, MD [facility] None  06/21/2024  1:00 PM [facility]MR 1 [facility]  07/09/2024  7:05 AM [facility] H[facility] DEVICE REMOTES [facility] H&V  07/13/2024  3:00 PM Katrinka Garnette KIDD, MD [name]  07/16/2024 10:40 AM Anner Alm ORN, MD [facility] H&V   10/08/2024  7:05 AM [facility]  HVT DEVICE REMOTES [facility] H&V  01/07/2025  7:05 AM [facility] HVT DEVICE REMOTES [facility] H&V  04/08/2025  7:05 AM [facility] HVT DEVICE REMOTES [facility] H&V  05/31/2025  3:40 PM [facility] ANNUAL WELLNESS VISIT 1 [facility] [place]  07/08/2025  7:05 AM [facility] HVT DEVICE REMOTES [facility] H&V    Lab/Order associations:   ICD-10-CM   1. Type 2 diabetes mellitus with peripheral neuropathy (HCC)  E11.42 Comprehensive metabolic panel with GFR    CBC with Differential/Platelet    HgB A1c    2. Other chronic pancreatitis (HCC)  K86.1 Lipase    Amylase    3. Blood loss  R58 IBC + Ferritin    Fecal occult blood, imunochemical    4. Gross hematuria  R31.0 Urinalysis, Routine w reflex microscopic    5. Anemia, unspecified type  D64.9 IBC + Ferritin    Fecal occult blood, imunochemical      Meds ordered this encounter  Medications   HYDROcodone -acetaminophen  (NORCO/VICODIN) 5-325 MG tablet    Sig: Take 1 tablet by mouth every 6 (six) hours as needed for moderate pain (pain score 4-6).    Dispense:  30 tablet    Refill:  0   I personally spent a total of 42 minutes in the care of the patient today including preparing to see the patient, getting/reviewing separately obtained history, performing a medically appropriate exam/evaluation, counseling and educating, and documenting clinical information in the EHR.  Return precautions advised.  Garnette Lukes, MD

## 2024-06-02 LAB — URINALYSIS, ROUTINE W REFLEX MICROSCOPIC
Bilirubin Urine: NEGATIVE
Hgb urine dipstick: NEGATIVE
Ketones, ur: NEGATIVE
Nitrite: POSITIVE — AB
Specific Gravity, Urine: 1.02 (ref 1.000–1.030)
Total Protein, Urine: NEGATIVE
Urine Glucose: NEGATIVE
Urobilinogen, UA: 0.2 (ref 0.0–1.0)
pH: 5 (ref 5.0–8.0)

## 2024-06-02 LAB — CBC WITH DIFFERENTIAL/PLATELET
Basophils Relative: 1 % (ref 0.0–3.0)
Eosinophils Relative: 7 % — ABNORMAL HIGH (ref 0.0–5.0)
HCT: 31.3 % — ABNORMAL LOW (ref 36.0–46.0)
Hemoglobin: 10.5 g/dL — ABNORMAL LOW (ref 12.0–15.0)
Lymphocytes Relative: 55 % — ABNORMAL HIGH (ref 12.0–46.0)
MCHC: 33.5 g/dL (ref 30.0–36.0)
MCV: 93.2 fl (ref 78.0–100.0)
Monocytes Relative: 23 % — ABNORMAL HIGH (ref 3.0–12.0)
Neutrophils Relative %: 14 % — ABNORMAL LOW (ref 43.0–77.0)
Platelets: 249 K/uL (ref 150.0–400.0)
RBC: 3.36 Mil/uL — ABNORMAL LOW (ref 3.87–5.11)
RDW: 15.7 % — ABNORMAL HIGH (ref 11.5–15.5)
WBC: 4.2 K/uL (ref 4.0–10.5)

## 2024-06-02 LAB — IBC + FERRITIN
Ferritin: 53.8 ng/mL (ref 10.0–291.0)
Iron: 41 ug/dL — ABNORMAL LOW (ref 42–145)
Saturation Ratios: 10.7 % — ABNORMAL LOW (ref 20.0–50.0)
TIBC: 382.2 ug/dL (ref 250.0–450.0)
Transferrin: 273 mg/dL (ref 212.0–360.0)

## 2024-06-02 LAB — HEMOGLOBIN A1C: Hgb A1c MFr Bld: 6.2 % (ref 4.6–6.5)

## 2024-06-02 LAB — COMPREHENSIVE METABOLIC PANEL WITH GFR
ALT: 8 U/L (ref 0–35)
AST: 10 U/L (ref 0–37)
Albumin: 4.1 g/dL (ref 3.5–5.2)
Alkaline Phosphatase: 63 U/L (ref 39–117)
BUN: 64 mg/dL — ABNORMAL HIGH (ref 6–23)
CO2: 26 meq/L (ref 19–32)
Calcium: 9.2 mg/dL (ref 8.4–10.5)
Chloride: 94 meq/L — ABNORMAL LOW (ref 96–112)
Creatinine, Ser: 1.67 mg/dL — ABNORMAL HIGH (ref 0.40–1.20)
GFR: 28.42 mL/min — ABNORMAL LOW (ref >60.00)
Glucose, Bld: 93 mg/dL (ref 70–99)
Potassium: 3.8 meq/L (ref 3.5–5.1)
Sodium: 131 meq/L — ABNORMAL LOW (ref 135–145)
Total Bilirubin: 0.5 mg/dL (ref 0.2–1.2)
Total Protein: 7.2 g/dL (ref 6.0–8.3)

## 2024-06-02 LAB — AMYLASE: Amylase: 39 U/L (ref 27–131)

## 2024-06-02 LAB — LIPASE: Lipase: 36 U/L (ref 11.0–59.0)

## 2024-06-03 ENCOUNTER — Ambulatory Visit: Payer: Self-pay | Admitting: Family Medicine

## 2024-06-04 ENCOUNTER — Other Ambulatory Visit: Payer: Self-pay | Admitting: Family Medicine

## 2024-06-04 ENCOUNTER — Telehealth: Payer: Self-pay | Admitting: Family Medicine

## 2024-06-04 DIAGNOSIS — R310 Gross hematuria: Secondary | ICD-10-CM

## 2024-06-04 NOTE — Telephone Encounter (Signed)
 Left voicemail for patient regarding need for coming in today to leave a urine sample so we can culture it and get the results back by Monday. It is very important that she comes in today per Dr. Katrinka. If patient calls back please schedule lab appt for today for urine culture. Orders have been placed.

## 2024-06-07 ENCOUNTER — Other Ambulatory Visit

## 2024-06-07 DIAGNOSIS — R310 Gross hematuria: Secondary | ICD-10-CM | POA: Diagnosis not present

## 2024-06-08 NOTE — Progress Notes (Signed)
 Radiation Oncology         [phone] ________________________________  Name: [name] MRN: [id number]  Date: 06/10/2024  DOB: [birth year]  Follow-Up Visit Note  CC: Katrinka Garnette KIDD, MD  Katrinka Garnette KIDD, MD  No diagnosis found.  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 She was last seen in office on 03/11/24 for a routine follow up visit. Patient continued to follow up with their specialists to manage their chronic conditions.    In the interval since she was last seen, she presented for a follow up with Dr. Timmy on 04/15/24 during which she reported feeling well overall with no new concerns.                       She underwent a PET on 03/23/24 showing a small type 1 hiatal hernia; mild dependent atelectasis in both lower lobes; eft adrenal adenoma. No further imaging workup of this lesion is indicated and pneumobilia.  No other significant oncologic interval history since the patient was last seen.   Of note: she underwent a cardiac catheterization 8/19 with Dr. Anner.  Allergies:  is allergic to codeine phosphate, contrast media [iodinated contrast media], statins, and tramadol.  Meds: Current Outpatient Medications  Medication Sig Dispense Refill   allopurinol  (ZYLOPRIM ) 100 MG tablet TAKE 1 TABLET BY MOUTH EVERY DAY 90 tablet 2   amiodarone  (PACERONE ) 200 MG tablet TAKE 400 MG (2 TABLETS) ONE DOSE AS NEED FOR A FAST HEART RATE ,IF AFTER 12 HOURS STILL PRESENT TAKE 200 MG ( 1 TABLET) ONE DOSE FOR  EACH EPISODE 180 tablet 1   apixaban  (ELIQUIS ) 5 MG TABS tablet Take 1 tablet (5 mg total) by mouth 2 (two) times daily. 180 tablet 3   Artificial Tear Solution (TEARS RENEWED OP) Apply 1 drop to eye daily as needed (dry eyes).     bismuth  subsalicylate (PEPTO BISMOL) 262 MG/15ML suspension Take 30 mLs by mouth every 6 (six) hours as needed for diarrhea or loose stools.     carvedilol  (COREG ) 6.25 MG tablet Take 1 tablet (6.25 mg total) by mouth 2 (two) times daily. 180 tablet 3   clopidogrel  (PLAVIX ) 75 MG tablet Take 1 tablet (75 mg total) by mouth daily with breakfast. 90 tablet 1   colchicine 0.6 MG tablet Take 0.6 mg by mouth daily as needed (Gout).     cyanocobalamin  (VITAMIN B12) 1000 MCG tablet Take 1,000 mcg by mouth daily.     diclofenac sodium (VOLTAREN) 1 % GEL Apply 2 g topically 4 (four) times daily as needed (pain).     furosemide  (LASIX ) 80 MG tablet Take 1 tablet (80 mg total) by mouth 2 (two) times daily. May take a additional if weight is 3 lbs or more with daily weight (Patient taking differently: Take 80 mg by mouth in the morning, at noon, and at bedtime. May take a additional if weight is 3 lbs or more with daily weight) 200 tablet 3   HYDROcodone -acetaminophen  (NORCO/VICODIN) 5-325 MG tablet Take 1  tablet by mouth every 6 (six) hours as needed for moderate pain (pain score 4-6). 30 tablet 0   hyoscyamine  (ANASPAZ ) 0.125 MG TBDP disintergrating tablet Place 1 tablet (0.125 mg total) under the tongue every 6 (six) hours as needed (abdominal cramping). 30 tablet 5   indapamide  (LOZOL ) 2.5 MG tablet Take 1 tablet (2.5 mg total) by mouth daily. 90 tablet 3   isosorbide  mononitrate (IMDUR ) 60 MG 24 hr tablet Take 1 tablet (60 mg total) by mouth daily. May take an extra dose if you use Nitroglycerin   sublingual tablet for 2 days afterwards 100 tablet 3   lidocaine  (XYLOCAINE ) 5 % ointment Apply 1 application. topically as needed. 35.44 g 0   losartan  (COZAAR ) 50 MG tablet TAKE 1  TABLET BY MOUTH EVERY DAY 90 tablet 3   meclizine  (ANTIVERT ) 12.5 MG tablet Take 1 tablet (12.5 mg total) by mouth 2 (two) times daily as needed for dizziness. 60 tablet 2   nitroGLYCERIN  (NITROSTAT ) 0.4 MG SL tablet DISSOLVE 1 TABLET UNDER THE TONGUE EVERY 5 MINUTES AS NEEDED FOR CHEST PAIN 25 tablet 2   nystatin  (MYCOSTATIN /NYSTOP ) powder Apply 1 Application topically 3 (three) times daily. 30 g 2   spironolactone  (ALDACTONE ) 25 MG tablet TAKE 1 TABLET BY MOUTH AS DIRECTED. TAKE 1 TABLET ON MONDAYS, WEDNESDAYS, AND FRIDAYS AT 6 PM. 45 tablet 1   No current facility-administered medications for this visit.    Physical Findings: The patient is in no acute distress. Patient is alert and oriented.  vitals were not taken for this visit. .  No significant changes. Lungs are clear to auscultation bilaterally. Heart has regular rate and rhythm. No palpable cervical, supraclavicular, or axillary adenopathy. Abdomen soft, non-tender, normal bowel sounds.   Lab Findings: Lab Results  Component Value Date   WBC 4.2 06/01/2024   HGB 10.5 (L) 06/01/2024   HCT 31.3 (L) 06/01/2024   MCV 93.2 06/01/2024   PLT 249.0 06/01/2024    Radiographic Findings: No results found.  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  ***  Plan:  ***   *** minutes of total time was spent for this patient encounter, including preparation, face-to-face counseling with the patient and coordination of care, physical exam, and documentation of the encounter. ____________________________________  Lynwood CHARM Nasuti, PhD, MD  This document serves as a record of services personally performed by Lynwood Nasuti, MD. It was created on his behalf by Reymundo Cartwright, a trained medical scribe. The creation of this record is based on the scribe's personal  observations and the provider's statements to them. This document has been checked and approved by the attending provider.

## 2024-06-10 ENCOUNTER — Ambulatory Visit: Payer: Self-pay | Admitting: Family Medicine

## 2024-06-10 ENCOUNTER — Encounter: Payer: Self-pay | Admitting: Radiation Oncology

## 2024-06-10 ENCOUNTER — Ambulatory Visit
Admission: RE | Admit: 2024-06-10 | Discharge: 2024-06-10 | Disposition: A | Discharge status: Home / Self Care | Source: Ambulatory Visit | Attending: Radiation Oncology | Admitting: Radiation Oncology

## 2024-06-10 VITALS — BP 138/67 | HR 84 | Temp 97.4°F | Resp 20 | Ht 68.0 in | Wt 187.4 lb

## 2024-06-10 DIAGNOSIS — Z79899 Other long term (current) drug therapy: Secondary | ICD-10-CM | POA: Insufficient documentation

## 2024-06-10 DIAGNOSIS — Z923 Personal history of irradiation: Secondary | ICD-10-CM | POA: Insufficient documentation

## 2024-06-10 DIAGNOSIS — C499 Malignant neoplasm of connective and soft tissue, unspecified: Secondary | ICD-10-CM

## 2024-06-10 DIAGNOSIS — Z7901 Long term (current) use of anticoagulants: Secondary | ICD-10-CM | POA: Insufficient documentation

## 2024-06-10 DIAGNOSIS — C4922 Malignant neoplasm of connective and soft tissue of left lower limb, including hip: Secondary | ICD-10-CM | POA: Insufficient documentation

## 2024-06-10 DIAGNOSIS — Z7902 Long term (current) use of antithrombotics/antiplatelets: Secondary | ICD-10-CM | POA: Diagnosis not present

## 2024-06-10 LAB — URINE CULTURE
MICRO NUMBER:: 17061037
SPECIMEN QUALITY:: ADEQUATE

## 2024-06-10 MED ORDER — SULFAMETHOXAZOLE-TRIMETHOPRIM 400-80 MG PO TABS: 1.0000 | ORAL_TABLET | Freq: Two times a day (BID) | ORAL | 0 refills | Status: DC

## 2024-06-10 NOTE — Progress Notes (Signed)
 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    Does the patient complain of any of the following:  Pain: Yes patient reports generalized pain. Reports left knee hurts occasionally.  Joint Swelling/ROM limitations:  yes, reports swelling to left knee and lower leg.  Fatigue: Yes  Post radiation skin changes: No   Additional comments if applicable:  BP 138/67 (BP Location: Left Arm, Patient Position: Sitting, Cuff Size: Large)   Pulse 84   Temp (!) 97.4 F (36.3 C)   Resp 20   Ht 5' 8 (1.727 m)   Wt 187 lb 6.4 oz (85 kg)   SpO2 97%   BMI 28.49 kg/m

## 2024-06-11 ENCOUNTER — Other Ambulatory Visit: Payer: Self-pay | Admitting: Family Medicine

## 2024-06-11 DIAGNOSIS — R310 Gross hematuria: Secondary | ICD-10-CM

## 2024-06-11 NOTE — Telephone Encounter (Signed)
 Pt has been scheduled for repeat lab visit on 06/25/24 @ 4pm.

## 2024-06-15 ENCOUNTER — Ambulatory Visit (HOSPITAL_BASED_OUTPATIENT_CLINIC_OR_DEPARTMENT_OTHER)
Admission: RE | Admit: 2024-06-15 | Discharge: 2024-06-15 | Disposition: A | Discharge status: Home / Self Care | Source: Ambulatory Visit | Attending: Vascular Surgery | Admitting: Vascular Surgery

## 2024-06-15 DIAGNOSIS — K5790 Diverticulosis of intestine, part unspecified, without perforation or abscess without bleeding: Secondary | ICD-10-CM | POA: Diagnosis not present

## 2024-06-15 DIAGNOSIS — I480 Paroxysmal atrial fibrillation: Secondary | ICD-10-CM | POA: Diagnosis not present

## 2024-06-15 DIAGNOSIS — E860 Dehydration: Secondary | ICD-10-CM | POA: Diagnosis present

## 2024-06-15 DIAGNOSIS — I4891 Unspecified atrial fibrillation: Secondary | ICD-10-CM | POA: Diagnosis present

## 2024-06-15 DIAGNOSIS — I5032 Chronic diastolic (congestive) heart failure: Secondary | ICD-10-CM | POA: Diagnosis present

## 2024-06-15 DIAGNOSIS — E876 Hypokalemia: Secondary | ICD-10-CM | POA: Diagnosis present

## 2024-06-15 DIAGNOSIS — D509 Iron deficiency anemia, unspecified: Secondary | ICD-10-CM | POA: Diagnosis present

## 2024-06-15 DIAGNOSIS — E861 Hypovolemia: Secondary | ICD-10-CM | POA: Diagnosis present

## 2024-06-15 DIAGNOSIS — I87302 Chronic venous hypertension (idiopathic) without complications of left lower extremity: Secondary | ICD-10-CM | POA: Insufficient documentation

## 2024-06-15 DIAGNOSIS — I272 Pulmonary hypertension, unspecified: Secondary | ICD-10-CM | POA: Diagnosis present

## 2024-06-15 DIAGNOSIS — E785 Hyperlipidemia, unspecified: Secondary | ICD-10-CM | POA: Diagnosis present

## 2024-06-15 DIAGNOSIS — N179 Acute kidney failure, unspecified: Secondary | ICD-10-CM | POA: Diagnosis present

## 2024-06-15 DIAGNOSIS — K838 Other specified diseases of biliary tract: Secondary | ICD-10-CM | POA: Diagnosis not present

## 2024-06-15 DIAGNOSIS — R0609 Other forms of dyspnea: Secondary | ICD-10-CM

## 2024-06-15 DIAGNOSIS — Z96652 Presence of left artificial knee joint: Secondary | ICD-10-CM | POA: Diagnosis present

## 2024-06-15 DIAGNOSIS — I130 Hypertensive heart and chronic kidney disease with heart failure and stage 1 through stage 4 chronic kidney disease, or unspecified chronic kidney disease: Secondary | ICD-10-CM | POA: Diagnosis present

## 2024-06-15 DIAGNOSIS — I2510 Atherosclerotic heart disease of native coronary artery without angina pectoris: Secondary | ICD-10-CM

## 2024-06-15 DIAGNOSIS — I252 Old myocardial infarction: Secondary | ICD-10-CM | POA: Diagnosis not present

## 2024-06-15 DIAGNOSIS — Z7901 Long term (current) use of anticoagulants: Secondary | ICD-10-CM | POA: Diagnosis not present

## 2024-06-15 DIAGNOSIS — I5030 Unspecified diastolic (congestive) heart failure: Secondary | ICD-10-CM

## 2024-06-15 DIAGNOSIS — M791 Myalgia, unspecified site: Secondary | ICD-10-CM | POA: Insufficient documentation

## 2024-06-15 DIAGNOSIS — R799 Abnormal finding of blood chemistry, unspecified: Secondary | ICD-10-CM | POA: Diagnosis not present

## 2024-06-15 DIAGNOSIS — I10 Essential (primary) hypertension: Secondary | ICD-10-CM | POA: Diagnosis not present

## 2024-06-15 DIAGNOSIS — D3502 Benign neoplasm of left adrenal gland: Secondary | ICD-10-CM | POA: Diagnosis not present

## 2024-06-15 DIAGNOSIS — Z79899 Other long term (current) drug therapy: Secondary | ICD-10-CM | POA: Diagnosis not present

## 2024-06-15 DIAGNOSIS — E1122 Type 2 diabetes mellitus with diabetic chronic kidney disease: Secondary | ICD-10-CM | POA: Diagnosis present

## 2024-06-15 DIAGNOSIS — K529 Noninfective gastroenteritis and colitis, unspecified: Secondary | ICD-10-CM | POA: Diagnosis present

## 2024-06-15 DIAGNOSIS — I25119 Atherosclerotic heart disease of native coronary artery with unspecified angina pectoris: Secondary | ICD-10-CM

## 2024-06-15 DIAGNOSIS — T466X5A Adverse effect of antihyperlipidemic and antiarteriosclerotic drugs, initial encounter: Secondary | ICD-10-CM | POA: Insufficient documentation

## 2024-06-15 DIAGNOSIS — Z8249 Family history of ischemic heart disease and other diseases of the circulatory system: Secondary | ICD-10-CM | POA: Diagnosis not present

## 2024-06-15 DIAGNOSIS — N200 Calculus of kidney: Secondary | ICD-10-CM | POA: Diagnosis not present

## 2024-06-15 DIAGNOSIS — N189 Chronic kidney disease, unspecified: Secondary | ICD-10-CM | POA: Diagnosis present

## 2024-06-15 DIAGNOSIS — Z7902 Long term (current) use of antithrombotics/antiplatelets: Secondary | ICD-10-CM | POA: Diagnosis not present

## 2024-06-15 DIAGNOSIS — Z950 Presence of cardiac pacemaker: Secondary | ICD-10-CM | POA: Diagnosis not present

## 2024-06-15 DIAGNOSIS — Z955 Presence of coronary angioplasty implant and graft: Secondary | ICD-10-CM | POA: Diagnosis not present

## 2024-06-15 DIAGNOSIS — Z923 Personal history of irradiation: Secondary | ICD-10-CM | POA: Diagnosis not present

## 2024-06-15 DIAGNOSIS — Z9861 Coronary angioplasty status: Secondary | ICD-10-CM | POA: Insufficient documentation

## 2024-06-15 DIAGNOSIS — E871 Hypo-osmolality and hyponatremia: Secondary | ICD-10-CM | POA: Diagnosis present

## 2024-06-15 LAB — ECHOCARDIOGRAM COMPLETE
AR max vel: 2.55 cm2
AV Area VTI: 2.75 cm2
AV Area mean vel: 2.42 cm2
AV Mean grad: 5 mmHg
AV Peak grad: 10.5 mmHg
Ao pk vel: 1.62 m/s
Area-P 1/2: 3.12 cm2
S' Lateral: 2.31 cm

## 2024-06-17 ENCOUNTER — Emergency Department (HOSPITAL_COMMUNITY)

## 2024-06-17 ENCOUNTER — Inpatient Hospital Stay: Attending: Hematology & Oncology

## 2024-06-17 ENCOUNTER — Inpatient Hospital Stay

## 2024-06-17 ENCOUNTER — Inpatient Hospital Stay (HOSPITAL_COMMUNITY)
Admission: EM | Admit: 2024-06-17 | Discharge: 2024-06-22 | DRG: 641 | Disposition: A | Discharge status: Home / Self Care | Attending: Internal Medicine | Admitting: Internal Medicine

## 2024-06-17 ENCOUNTER — Other Ambulatory Visit: Payer: Self-pay

## 2024-06-17 ENCOUNTER — Telehealth: Payer: Self-pay

## 2024-06-17 ENCOUNTER — Inpatient Hospital Stay: Admitting: Medical Oncology

## 2024-06-17 DIAGNOSIS — E1122 Type 2 diabetes mellitus with diabetic chronic kidney disease: Secondary | ICD-10-CM | POA: Diagnosis present

## 2024-06-17 DIAGNOSIS — Z90710 Acquired absence of both cervix and uterus: Secondary | ICD-10-CM

## 2024-06-17 DIAGNOSIS — I4891 Unspecified atrial fibrillation: Secondary | ICD-10-CM | POA: Diagnosis present

## 2024-06-17 DIAGNOSIS — Z96652 Presence of left artificial knee joint: Secondary | ICD-10-CM | POA: Diagnosis present

## 2024-06-17 DIAGNOSIS — Z8711 Personal history of peptic ulcer disease: Secondary | ICD-10-CM

## 2024-06-17 DIAGNOSIS — Z7901 Long term (current) use of anticoagulants: Secondary | ICD-10-CM | POA: Diagnosis not present

## 2024-06-17 DIAGNOSIS — N189 Chronic kidney disease, unspecified: Secondary | ICD-10-CM | POA: Diagnosis present

## 2024-06-17 DIAGNOSIS — K838 Other specified diseases of biliary tract: Secondary | ICD-10-CM | POA: Diagnosis not present

## 2024-06-17 DIAGNOSIS — E871 Hypo-osmolality and hyponatremia: Principal | ICD-10-CM | POA: Diagnosis present

## 2024-06-17 DIAGNOSIS — D3502 Benign neoplasm of left adrenal gland: Secondary | ICD-10-CM | POA: Diagnosis not present

## 2024-06-17 DIAGNOSIS — E785 Hyperlipidemia, unspecified: Secondary | ICD-10-CM | POA: Diagnosis present

## 2024-06-17 DIAGNOSIS — Z888 Allergy status to other drugs, medicaments and biological substances status: Secondary | ICD-10-CM

## 2024-06-17 DIAGNOSIS — R112 Nausea with vomiting, unspecified: Secondary | ICD-10-CM

## 2024-06-17 DIAGNOSIS — E860 Dehydration: Secondary | ICD-10-CM | POA: Diagnosis present

## 2024-06-17 DIAGNOSIS — Z9841 Cataract extraction status, right eye: Secondary | ICD-10-CM

## 2024-06-17 DIAGNOSIS — I5032 Chronic diastolic (congestive) heart failure: Secondary | ICD-10-CM | POA: Diagnosis present

## 2024-06-17 DIAGNOSIS — C499 Malignant neoplasm of connective and soft tissue, unspecified: Secondary | ICD-10-CM

## 2024-06-17 DIAGNOSIS — Z950 Presence of cardiac pacemaker: Secondary | ICD-10-CM | POA: Diagnosis not present

## 2024-06-17 DIAGNOSIS — I2510 Atherosclerotic heart disease of native coronary artery without angina pectoris: Secondary | ICD-10-CM | POA: Diagnosis present

## 2024-06-17 DIAGNOSIS — Z833 Family history of diabetes mellitus: Secondary | ICD-10-CM

## 2024-06-17 DIAGNOSIS — N179 Acute kidney failure, unspecified: Secondary | ICD-10-CM | POA: Diagnosis present

## 2024-06-17 DIAGNOSIS — Z8249 Family history of ischemic heart disease and other diseases of the circulatory system: Secondary | ICD-10-CM | POA: Diagnosis not present

## 2024-06-17 DIAGNOSIS — Z955 Presence of coronary angioplasty implant and graft: Secondary | ICD-10-CM | POA: Diagnosis not present

## 2024-06-17 DIAGNOSIS — Z923 Personal history of irradiation: Secondary | ICD-10-CM

## 2024-06-17 DIAGNOSIS — Z87442 Personal history of urinary calculi: Secondary | ICD-10-CM

## 2024-06-17 DIAGNOSIS — I130 Hypertensive heart and chronic kidney disease with heart failure and stage 1 through stage 4 chronic kidney disease, or unspecified chronic kidney disease: Secondary | ICD-10-CM | POA: Diagnosis present

## 2024-06-17 DIAGNOSIS — I480 Paroxysmal atrial fibrillation: Secondary | ICD-10-CM | POA: Diagnosis not present

## 2024-06-17 DIAGNOSIS — I252 Old myocardial infarction: Secondary | ICD-10-CM | POA: Diagnosis not present

## 2024-06-17 DIAGNOSIS — R799 Abnormal finding of blood chemistry, unspecified: Secondary | ICD-10-CM | POA: Diagnosis not present

## 2024-06-17 DIAGNOSIS — Z79899 Other long term (current) drug therapy: Secondary | ICD-10-CM

## 2024-06-17 DIAGNOSIS — E876 Hypokalemia: Secondary | ICD-10-CM | POA: Diagnosis present

## 2024-06-17 DIAGNOSIS — Z7902 Long term (current) use of antithrombotics/antiplatelets: Secondary | ICD-10-CM | POA: Diagnosis not present

## 2024-06-17 DIAGNOSIS — R310 Gross hematuria: Secondary | ICD-10-CM

## 2024-06-17 DIAGNOSIS — N200 Calculus of kidney: Secondary | ICD-10-CM | POA: Diagnosis not present

## 2024-06-17 DIAGNOSIS — Z91041 Radiographic dye allergy status: Secondary | ICD-10-CM

## 2024-06-17 DIAGNOSIS — E861 Hypovolemia: Secondary | ICD-10-CM | POA: Diagnosis present

## 2024-06-17 DIAGNOSIS — I10 Essential (primary) hypertension: Secondary | ICD-10-CM | POA: Diagnosis not present

## 2024-06-17 DIAGNOSIS — I272 Pulmonary hypertension, unspecified: Secondary | ICD-10-CM | POA: Diagnosis present

## 2024-06-17 DIAGNOSIS — Z87440 Personal history of urinary (tract) infections: Secondary | ICD-10-CM

## 2024-06-17 DIAGNOSIS — Z885 Allergy status to narcotic agent status: Secondary | ICD-10-CM

## 2024-06-17 DIAGNOSIS — K5790 Diverticulosis of intestine, part unspecified, without perforation or abscess without bleeding: Secondary | ICD-10-CM | POA: Diagnosis not present

## 2024-06-17 DIAGNOSIS — Z961 Presence of intraocular lens: Secondary | ICD-10-CM | POA: Diagnosis present

## 2024-06-17 DIAGNOSIS — Z9842 Cataract extraction status, left eye: Secondary | ICD-10-CM

## 2024-06-17 DIAGNOSIS — D509 Iron deficiency anemia, unspecified: Secondary | ICD-10-CM | POA: Diagnosis present

## 2024-06-17 DIAGNOSIS — I878 Other specified disorders of veins: Secondary | ICD-10-CM | POA: Diagnosis present

## 2024-06-17 DIAGNOSIS — K529 Noninfective gastroenteritis and colitis, unspecified: Secondary | ICD-10-CM | POA: Diagnosis present

## 2024-06-17 DIAGNOSIS — Z823 Family history of stroke: Secondary | ICD-10-CM

## 2024-06-17 LAB — CBC WITH DIFFERENTIAL/PLATELET
Abs Immature Granulocytes: 0.03 K/uL (ref 0.00–0.07)
Basophils Absolute: 0 K/uL (ref 0.0–0.1)
Basophils Relative: 0 %
Eosinophils Absolute: 0.3 K/uL (ref 0.0–0.5)
Eosinophils Relative: 3 %
HCT: 30.5 % — ABNORMAL LOW (ref 36.0–46.0)
Hemoglobin: 10.5 g/dL — ABNORMAL LOW (ref 12.0–15.0)
Immature Granulocytes: 0 %
Lymphocytes Relative: 14 %
Lymphs Abs: 1.3 K/uL (ref 0.7–4.0)
MCH: 30.3 pg (ref 26.0–34.0)
MCHC: 34.4 g/dL (ref 30.0–36.0)
MCV: 87.9 fL (ref 80.0–100.0)
Monocytes Absolute: 1.1 K/uL — ABNORMAL HIGH (ref 0.1–1.0)
Monocytes Relative: 12 %
Neutro Abs: 6.4 K/uL (ref 1.7–7.7)
Neutrophils Relative %: 71 %
Platelets: 301 K/uL (ref 150–400)
RBC: 3.47 MIL/uL — ABNORMAL LOW (ref 3.87–5.11)
RDW: 14.3 % (ref 11.5–15.5)
WBC: 9 K/uL (ref 4.0–10.5)
nRBC: 0 % (ref 0.0–0.2)

## 2024-06-17 LAB — MAGNESIUM: Magnesium: 2.9 mg/dL — ABNORMAL HIGH (ref 1.7–2.4)

## 2024-06-17 LAB — CBC WITH DIFFERENTIAL (CANCER CENTER ONLY)
Abs Immature Granulocytes: 0.05 K/uL (ref 0.00–0.07)
Basophils Absolute: 0 K/uL (ref 0.0–0.1)
Basophils Relative: 0 %
Eosinophils Absolute: 0.2 K/uL (ref 0.0–0.5)
Eosinophils Relative: 2 %
HCT: 28.8 % — ABNORMAL LOW (ref 36.0–46.0)
Hemoglobin: 10.5 g/dL — ABNORMAL LOW (ref 12.0–15.0)
Immature Granulocytes: 1 %
Lymphocytes Relative: 12 %
Lymphs Abs: 1.2 K/uL (ref 0.7–4.0)
MCH: 31.8 pg (ref 26.0–34.0)
MCHC: 36.5 g/dL — ABNORMAL HIGH (ref 30.0–36.0)
MCV: 87.3 fL (ref 80.0–100.0)
Monocytes Absolute: 1.5 K/uL — ABNORMAL HIGH (ref 0.1–1.0)
Monocytes Relative: 14 %
Neutro Abs: 7.2 K/uL (ref 1.7–7.7)
Neutrophils Relative %: 71 %
Platelet Count: 310 K/uL (ref 150–400)
RBC: 3.3 MIL/uL — ABNORMAL LOW (ref 3.87–5.11)
RDW: 14.2 % (ref 11.5–15.5)
WBC Count: 10.2 K/uL (ref 4.0–10.5)
nRBC: 0 % (ref 0.0–0.2)

## 2024-06-17 LAB — COMPREHENSIVE METABOLIC PANEL WITH GFR
ALT: 16 U/L (ref 0–44)
AST: 20 U/L (ref 15–41)
Albumin: 4.2 g/dL (ref 3.5–5.0)
Alkaline Phosphatase: 70 U/L (ref 38–126)
Anion gap: 15 (ref 5–15)
BUN: 90 mg/dL — ABNORMAL HIGH (ref 8–23)
CO2: 25 mmol/L (ref 22–32)
Calcium: 9.5 mg/dL (ref 8.9–10.3)
Chloride: 77 mmol/L — ABNORMAL LOW (ref 98–111)
Creatinine, Ser: 3.16 mg/dL — ABNORMAL HIGH (ref 0.44–1.00)
GFR, Estimated: 14 mL/min — ABNORMAL LOW (ref >60)
Glucose, Bld: 125 mg/dL — ABNORMAL HIGH (ref 70–99)
Potassium: 2.5 mmol/L — CL (ref 3.5–5.1)
Sodium: 117 mmol/L — CL (ref 135–145)
Total Bilirubin: 0.5 mg/dL (ref 0.0–1.2)
Total Protein: 6.8 g/dL (ref 6.5–8.1)

## 2024-06-17 LAB — CMP (CANCER CENTER ONLY)
ALT: 15 U/L (ref 0–44)
AST: 18 U/L (ref 15–41)
Albumin: 4.2 g/dL (ref 3.5–5.0)
Alkaline Phosphatase: 65 U/L (ref 38–126)
Anion gap: 17 — ABNORMAL HIGH (ref 5–15)
BUN: 84 mg/dL — ABNORMAL HIGH (ref 8–23)
CO2: 25 mmol/L (ref 22–32)
Calcium: 9.4 mg/dL (ref 8.9–10.3)
Chloride: 77 mmol/L — ABNORMAL LOW (ref 98–111)
Creatinine: 3.22 mg/dL — ABNORMAL HIGH (ref 0.44–1.00)
GFR, Estimated: 14 mL/min — ABNORMAL LOW (ref >60)
Glucose, Bld: 113 mg/dL — ABNORMAL HIGH (ref 70–99)
Potassium: 2.7 mmol/L — CL (ref 3.5–5.1)
Sodium: 118 mmol/L — CL (ref 135–145)
Total Bilirubin: 0.5 mg/dL (ref 0.0–1.2)
Total Protein: 6.8 g/dL (ref 6.5–8.1)

## 2024-06-17 LAB — URINALYSIS, ROUTINE W REFLEX MICROSCOPIC
Bilirubin Urine: NEGATIVE
Glucose, UA: NEGATIVE mg/dL
Hgb urine dipstick: NEGATIVE
Ketones, ur: NEGATIVE mg/dL
Leukocytes,Ua: NEGATIVE
Nitrite: NEGATIVE
Protein, ur: NEGATIVE mg/dL
Specific Gravity, Urine: 1.008 (ref 1.005–1.030)
pH: 6 (ref 5.0–8.0)

## 2024-06-17 LAB — SODIUM, URINE, RANDOM: Sodium, Ur: <30 mmol/L

## 2024-06-17 LAB — LACTATE DEHYDROGENASE: LDH: 184 U/L (ref 98–192)

## 2024-06-17 LAB — CREATININE, URINE, RANDOM: Creatinine, Urine: 56 mg/dL

## 2024-06-17 MED ORDER — ISOSORBIDE MONONITRATE ER 60 MG PO TB24
60.0000 mg | ORAL_TABLET | Freq: Every day | ORAL | Status: DC
  Administered 2024-06-19 – 2024-06-22 (×3): 60 mg via ORAL
  Filled 2024-06-17 (×4): qty 1

## 2024-06-17 MED ORDER — AMIODARONE HCL 200 MG PO TABS
200.0000 mg | ORAL_TABLET | Freq: Every day | ORAL | Status: DC
  Administered 2024-06-19 – 2024-06-22 (×3): 200 mg via ORAL
  Filled 2024-06-17 (×4): qty 1

## 2024-06-17 MED ORDER — ACETAMINOPHEN 650 MG RE SUPP: 650.0000 mg | Freq: Four times a day (QID) | RECTAL | Status: DC | PRN

## 2024-06-17 MED ORDER — BISACODYL 5 MG PO TBEC
5.0000 mg | DELAYED_RELEASE_TABLET | Freq: Every day | ORAL | Status: DC | PRN
Start: 2024-06-17 — End: 2024-06-22

## 2024-06-17 MED ORDER — CARVEDILOL 6.25 MG PO TABS
6.2500 mg | ORAL_TABLET | Freq: Two times a day (BID) | ORAL | Status: DC
  Administered 2024-06-18 – 2024-06-20 (×6): 6.25 mg via ORAL
  Filled 2024-06-17 (×3): qty 1
  Filled 2024-06-17: qty 2
  Filled 2024-06-17 (×3): qty 1
  Filled 2024-06-17: qty 2
  Filled 2024-06-17: qty 1

## 2024-06-17 MED ORDER — LACTATED RINGERS IV SOLN: INTRAVENOUS | Status: AC

## 2024-06-17 MED ORDER — SODIUM CHLORIDE 0.9% FLUSH
3.0000 mL | Freq: Two times a day (BID) | INTRAVENOUS | Status: DC
  Administered 2024-06-18 – 2024-06-22 (×9): 3 mL via INTRAVENOUS

## 2024-06-17 MED ORDER — ONDANSETRON HCL 4 MG/2ML IJ SOLN
4.0000 mg | Freq: Once | INTRAMUSCULAR | Status: AC
  Administered 2024-06-17: 4 mg via INTRAVENOUS
  Filled 2024-06-17: qty 2

## 2024-06-17 MED ORDER — LOSARTAN POTASSIUM 50 MG PO TABS
50.0000 mg | ORAL_TABLET | Freq: Every day | ORAL | Status: DC
  Administered 2024-06-18: 50 mg via ORAL
  Filled 2024-06-17: qty 1

## 2024-06-17 MED ORDER — SODIUM CHLORIDE 0.9 % IV BOLUS
1000.0000 mL | Freq: Once | INTRAVENOUS | Status: AC
  Administered 2024-06-17: 1000 mL via INTRAVENOUS

## 2024-06-17 MED ORDER — POTASSIUM CHLORIDE 10 MEQ/100ML IV SOLN
10.0000 meq | INTRAVENOUS | Status: AC
  Administered 2024-06-17 (×3): 10 meq via INTRAVENOUS
  Filled 2024-06-17 (×3): qty 100

## 2024-06-17 MED ORDER — APIXABAN 2.5 MG PO TABS
5.0000 mg | ORAL_TABLET | Freq: Two times a day (BID) | ORAL | Status: DC
  Administered 2024-06-18 (×2): 5 mg via ORAL
  Filled 2024-06-17 (×2): qty 2

## 2024-06-17 MED ORDER — CLOPIDOGREL BISULFATE 75 MG PO TABS
75.0000 mg | ORAL_TABLET | Freq: Every day | ORAL | Status: DC
  Administered 2024-06-18 – 2024-06-22 (×5): 75 mg via ORAL
  Filled 2024-06-17 (×5): qty 1

## 2024-06-17 MED ORDER — ONDANSETRON HCL 4 MG/2ML IJ SOLN
4.0000 mg | Freq: Four times a day (QID) | INTRAMUSCULAR | Status: DC | PRN
  Administered 2024-06-18 – 2024-06-20 (×4): 4 mg via INTRAVENOUS
  Filled 2024-06-17 (×4): qty 2

## 2024-06-17 MED ORDER — ONDANSETRON HCL 4 MG PO TABS
4.0000 mg | ORAL_TABLET | Freq: Four times a day (QID) | ORAL | Status: DC | PRN
  Administered 2024-06-22 (×2): 4 mg via ORAL
  Filled 2024-06-17 (×2): qty 1

## 2024-06-17 MED ORDER — ALLOPURINOL 100 MG PO TABS
100.0000 mg | ORAL_TABLET | Freq: Every day | ORAL | Status: DC
  Administered 2024-06-18 – 2024-06-22 (×5): 100 mg via ORAL
  Filled 2024-06-17 (×5): qty 1

## 2024-06-17 MED ORDER — ACETAMINOPHEN 325 MG PO TABS
650.0000 mg | ORAL_TABLET | Freq: Four times a day (QID) | ORAL | Status: DC | PRN
  Administered 2024-06-19 – 2024-06-20 (×2): 650 mg via ORAL
  Filled 2024-06-17 (×2): qty 2

## 2024-06-17 MED ORDER — POTASSIUM CHLORIDE CRYS ER 20 MEQ PO TBCR
40.0000 meq | EXTENDED_RELEASE_TABLET | Freq: Once | ORAL | Status: AC
Start: 2024-06-17 — End: 2024-06-17
  Administered 2024-06-17: 40 meq via ORAL
  Filled 2024-06-17: qty 2

## 2024-06-17 MED ORDER — SPIRONOLACTONE 25 MG PO TABS: 25.0000 mg | ORAL_TABLET | ORAL | Status: DC

## 2024-06-17 NOTE — ED Triage Notes (Signed)
 Pt arrives to triage via wheelchair accompanied by husband. PT was called and told to come to the ER because her sodium and potassium are low. Pt endorses feeling weak.

## 2024-06-17 NOTE — ED Provider Notes (Signed)
 [place] [facility] DEPARTMENT AT [facility] Provider Note   CSN: [id number] Arrival date & time: 06/17/24  1800     History  Chief Complaint  Patient presents with   Abnormal Lab    [name] is a 83 y.o. female with Afib, h/o myxofibrosarcoma in remission, followed by Dr. Timmy, bradycardia s/p pacemaker, CAD s/p perc angioplasty, who presents with abnormal labs. Was seeing Dr. Timmy for follow up for surveillance for sarcoma, had labs done which showed severe hyponatremia/hypokalemia. Has had significantly decreased PO intake d/t nausea/vomiting. Very decreased solid oral intake x 2-3 weeks. Endorses bilateral back pain, L>R. Endorses nausea/vomiting and diarrhea as well. No hematochezia/melena. Been going on for 2-3 months. Endorses abdominal pain that occurred on Sunday, cramping severely. Last bowel movement was this morning, loose, no hematochezia/melena.  Denies dysuria/hematuria, fevers/chills. +Dizziness/lightheadedness. In the last week, thinks she may have passed out once or twice but isn't sure. Hasn't fallen or hit her head. No seizure-like activity.   Past Medical History:  Diagnosis Date   Adrenal adenoma    Arthritis    fingers, right shoulder (09/09/2017)   Atrial fibrillation (HCC) 12/2021   New diagnosis noted on pacemaker interrogation.   Back pain with radiation    Chronic Back Pain - mutliple surgeries (including tumor removal)   Bilateral edema of lower extremity    Chronic, likely related to venous stasis   Bradycardia    Pacemaker placed   CAD S/P percutaneous coronary angioplasty    a) LHC: 07/30/10. -- 3.0x73mm Integrity BMS to pRCA & 2.5x 15mm Integrity BMS mLAD(@ D2).  b) Class III-IV Angina 03/2011: LHC- ISR in LAD BMS -- prox overlapping Promus DES 2.5x34mm and PTCA of jailed D2 ostium-prox 80%. c) 02/03/12:  LHC- patent stents.  Jailed diagonal. with stable flow; d) Peri-OP NSTEMI 04/2012 - LHC in 12/'13 -    Celiac artery stenosis     12/2016 - STENT placement   Complication of anesthesia    used to wake up wild years ago (09/09/2017)   Congestive heart failure with LV diastolic dysfunction, NYHA class 2 (HCC)    06/13/10:  last 2D echo-  EF >55%, Mild TR, Mod Conc LVH - Grade 1 diastolic dysfunction (abnormal relaxation) --> LVEDP on Cath 28 mmHg & mild 2nd Pulm HTN   Diet-controlled type 2 diabetes mellitus (HCC)    Diverticulitis of colon (without mention of hemorrhage)(562.11)    Diverticulosis    Dyslipidemia, goal LDL below 70    Intolerant to statins   GERD (gastroesophageal reflux disease)    Hepatitis ~ 1957   yellow jaundice (01/14/2017)   Hepatomegaly    Hiatal hernia    History of blood transfusion 04/2012   when I had a heart attack   History of kidney stones    I've got a stone embedded in one of my kidneys (09/09/2017)   History of radiation therapy    Left Knee - 03/05/23-04/30/23- Dr. Lynwood Nasuti   History of stomach ulcers years ago   Irritable bowel syndrome (IBS)    Labile essential hypertension    Partially related to RAS   Mesenteric artery stenosis    95% Celiac Artery - ostial, 20-30% SMA.  Bilateral Renal A: L RA stent patent, R RA 20-30% -- Conservative Management   Mobitz type 2 second degree AV block    Status post PPM placement   NSTEMI (non-ST elevated myocardial infarction) (HCC) 04/2012   Unclear the details, apparently this was  postoperative from her back surgery that she was cleared for my last saw her in June.  Reportedly had stents placed   Pancreas divisum    On pancreatic enzyme   PAT (paroxysmal atrial tachycardia)    Renal artery stenosis 2011; 12/'13   a) Angiogram 02/03/12:  50-60%L RA stenosis, 40% R R Inferior artery; b) 12/'13: S/P L RA Stent (High Pt. Reg) 6.0 mm x 15 mm; c) Renal Duplex 10/2013: <60% L RA, <60 R RA, ~60% SMA & Celiac A.   S/P placement of cardiac pacemaker 07/2021   Medtronic   Stricture and stenosis of esophagus    Thyroid  nodule     Unspecified gastritis and gastroduodenitis without mention of hemorrhage        Home Medications Prior to Admission medications   Medication Sig Start Date End Date Taking? Authorizing Provider  allopurinol  (ZYLOPRIM ) 100 MG tablet TAKE 1 TABLET BY MOUTH EVERY DAY 11/28/23   Katrinka Garnette KIDD, MD  amiodarone  (PACERONE ) 200 MG tablet TAKE 400 MG (2 TABLETS) ONE DOSE AS NEED FOR A FAST HEART RATE ,IF AFTER 12 HOURS STILL PRESENT TAKE 200 MG ( 1 TABLET) ONE DOSE FOR EACH EPISODE 05/16/23   Anner Alm ORN, MD  apixaban  (ELIQUIS ) 5 MG TABS tablet Take 1 tablet (5 mg total) by mouth 2 (two) times daily. 09/19/23   Croitoru, Mihai, MD  Artificial Tear Solution (TEARS RENEWED OP) Apply 1 drop to eye daily as needed (dry eyes).    [provider]  bismuth  subsalicylate (PEPTO BISMOL) 262 MG/15ML suspension Take 30 mLs by mouth every 6 (six) hours as needed for diarrhea or loose stools.    [provider]  carvedilol  (COREG ) 6.25 MG tablet Take 1 tablet (6.25 mg total) by mouth 2 (two) times daily. 05/17/24   Anner Alm ORN, MD  clopidogrel  (PLAVIX ) 75 MG tablet Take 1 tablet (75 mg total) by mouth daily with breakfast. 04/22/24   Henry Shaver B, NP  colchicine 0.6 MG tablet Take 0.6 mg by mouth daily as needed (Gout). 03/15/22   [provider]  cyanocobalamin  (VITAMIN B12) 1000 MCG tablet Take 1,000 mcg by mouth daily.    [provider]  diclofenac sodium (VOLTAREN) 1 % GEL Apply 2 g topically 4 (four) times daily as needed (pain). 02/23/19   [provider]  furosemide  (LASIX ) 80 MG tablet Take 1 tablet (80 mg total) by mouth 2 (two) times daily. May take a additional if weight is 3 lbs or more with daily weight Patient taking differently: Take 80 mg by mouth in the morning, at noon, and at bedtime. May take a additional if weight is 3 lbs or more with daily weight 05/17/24   Anner Alm ORN, MD  HYDROcodone -acetaminophen  (NORCO/VICODIN) 5-325 MG tablet  Take 1 tablet by mouth every 6 (six) hours as needed for moderate pain (pain score 4-6). 06/01/24   Katrinka Garnette KIDD, MD  hyoscyamine  (ANASPAZ ) 0.125 MG TBDP disintergrating tablet Place 1 tablet (0.125 mg total) under the tongue every 6 (six) hours as needed (abdominal cramping). 02/19/24   Katrinka Garnette KIDD, MD  indapamide  (LOZOL ) 2.5 MG tablet Take 1 tablet (2.5 mg total) by mouth daily. 05/31/24   Anner Alm ORN, MD  isosorbide  mononitrate (IMDUR ) 60 MG 24 hr tablet Take 1 tablet (60 mg total) by mouth daily. May take an extra dose if you use Nitroglycerin   sublingual tablet for 2 days afterwards 09/29/23   Anner Alm ORN, MD  lidocaine  (XYLOCAINE ) 5 %  ointment Apply 1 application. topically as needed. 11/27/21   Long, Fonda MATSU, MD  losartan  (COZAAR ) 50 MG tablet TAKE 1 TABLET BY MOUTH EVERY DAY 11/13/23   Anner Alm ORN, MD  meclizine  (ANTIVERT ) 12.5 MG tablet Take 1 tablet (12.5 mg total) by mouth 2 (two) times daily as needed for dizziness. 02/19/24   Katrinka Garnette KIDD, MD  nitroGLYCERIN  (NITROSTAT ) 0.4 MG SL tablet DISSOLVE 1 TABLET UNDER THE TONGUE EVERY 5 MINUTES AS NEEDED FOR CHEST PAIN 04/21/24   Henry Shaver B, NP  nystatin  (MYCOSTATIN /NYSTOP ) powder Apply 1 Application topically 3 (three) times daily. 06/16/23   Timmy Maude SAUNDERS, MD  spironolactone  (ALDACTONE ) 25 MG tablet TAKE 1 TABLET BY MOUTH AS DIRECTED. TAKE 1 TABLET ON MONDAYS, WEDNESDAYS, AND FRIDAYS AT 6 PM. 02/18/24   Anner Alm ORN, MD  sulfamethoxazole-trimethoprim (BACTRIM) 400-80 MG tablet Take 1 tablet by mouth 2 (two) times daily. 06/10/24   Katrinka Garnette KIDD, MD      Allergies    Codeine phosphate, Contrast media [iodinated contrast media], Statins, and Tramadol    Review of Systems   Review of Systems A 10 point review of systems was performed and is negative unless otherwise reported in HPI.  Physical Exam Updated Vital Signs BP (!) 115/36   Pulse 72   Temp 97.6 F (36.4 C) (Oral)   Resp 15   SpO2 97%   Physical Exam General: Normal appearing elderly female, lying in bed.  HEENT: PERRLA, Sclera anicteric, MMM, trachea midline.  Cardiology: RRR, no murmurs/rubs/gallops.  Resp: Normal respiratory rate and effort. CTAB, no wheezes, rhonchi, crackles.  Abd: Soft, non-tender, non-distended. No rebound tenderness or guarding.  GU: Deferred. MSK: No peripheral edema or signs of trauma. Extremities without deformity or TTP. No cyanosis or clubbing. Skin: warm, dry. Back: +L CVA tenderness, no R CVA ttp. No midline T or L spine TTP.  Neuro: A&Ox4, CNs II-XII grossly intact. MAEs. Sensation grossly intact.  Psych: Normal mood and affect.   ED Results / Procedures / Treatments   Labs (all labs ordered are listed, but only abnormal results are displayed) Labs Reviewed  CBC WITH DIFFERENTIAL/PLATELET - Abnormal; Notable for the following components:      Result Value   RBC 3.47 (*)    Hemoglobin 10.5 (*)    HCT 30.5 (*)    Monocytes Absolute 1.1 (*)    All other components within normal limits  COMPREHENSIVE METABOLIC PANEL WITH GFR - Abnormal; Notable for the following components:   Sodium 117 (*)    Potassium 2.5 (*)    Chloride 77 (*)    Glucose, Bld 125 (*)    BUN 90 (*)    Creatinine, Ser 3.16 (*)    GFR, Estimated 14 (*)    All other components within normal limits  MAGNESIUM  - Abnormal; Notable for the following components:   Magnesium  2.9 (*)    All other components within normal limits  SODIUM, URINE, RANDOM  CREATININE, URINE, RANDOM  OSMOLALITY, URINE  URINALYSIS, ROUTINE W REFLEX MICROSCOPIC  OSMOLALITY  BASIC METABOLIC PANEL WITH GFR    EKG EKG Interpretation Date/Time:  Thursday June 17 2024 19:38:30 EDT Ventricular Rate:  68 PR Interval:  187 QRS Duration:  107 QT Interval:  569 QTC Calculation: 606 R Axis:   73  Text Interpretation: Atrial-paced complexes Repol abnrm suggests ischemia, inferior leads Prolonged QT interval Confirmed by Francesca Fallow  [phone]) on 06/18/2024 7:08:06 PM  Radiology CT renal stone study: 1. Wall thickening  of the majority of the sigmoid colon with diverticula, increased in extent compared to prior study. No acute inflammation. Findings may be related to chronic sequelae of colitis or diverticulitis. An underlying neoplastic process is not excluded. 2. Single 4 mm calculus in the right kidney.  Procedures .Critical Care  Performed by: Franklyn Sid SAILOR, MD Authorized by: Franklyn Sid SAILOR, MD   Critical care provider statement:    Critical care time (minutes):  30   Critical care was necessary to treat or prevent imminent or life-threatening deterioration of the following conditions:  Metabolic crisis and dehydration   Critical care was time spent personally by me on the following activities:  Development of treatment plan with patient or surrogate, discussions with consultants, evaluation of patient's response to treatment, examination of patient, ordering and review of laboratory studies, ordering and review of radiographic studies, ordering and performing treatments and interventions, pulse oximetry, re-evaluation of patient's condition, review of old charts and obtaining history from patient or surrogate   Care discussed with: admitting provider       Medications Ordered in ED Medications  potassium chloride  10 mEq in 100 mL IVPB (10 mEq Intravenous New Bag/Given 06/17/24 2039)  sodium chloride  0.9 % bolus 1,000 mL (1,000 mLs Intravenous New Bag/Given 06/17/24 1920)  ondansetron  (ZOFRAN ) injection 4 mg (4 mg Intravenous Given 06/17/24 1920)  potassium chloride  SA (KLOR-CON  M) CR tablet 40 mEq (40 mEq Oral Given 06/17/24 2036)    ED Course/ Medical Decision Making/ A&P                          Medical Decision Making Amount and/or Complexity of Data Reviewed Labs: ordered. Decision-making details documented in ED Course. Radiology: ordered. Decision-making details documented in ED  Course.  Risk Prescription drug management. Decision regarding hospitalization.    This patient presents to the ED for concern of abnormal labs, nausea/vomiting, back pain; this involves an extensive number of treatment options, and is a complaint that carries with it a high risk of complications and morbidity.  I considered the following differential and admission for this acute, potentially life threatening condition.   MDM:    Patient found to have hyponatremia and hypokalemia.  In the setting of nausea vomiting and diarrhea, consider hypovolemic hyponatremia or possibly due to GI losses as well.  She has had intermittent mild confusion over the last several days to weeks but has had no seizure-like activity, also has possibly had some loss of consciousness.  Will start with 1 L normal saline supplementation and discussed with nephrology.  Also gave 40 mill equivalents potassium p.o. and will give IV potassium as well.  She does have an AKI 3.16 and has reported back pain.  This back pain seems to be chronic according to the patient and her husband however has been worse recently, left greater than right.  She does have some mild left CVA tenderness which raises concern for possible urolithiasis, hydronephrosis, obstructive uropathy that could be contributing to her symptoms.  Will obtain a CT renal stone study for further evaluation.  She has not had any urinary symptoms and UA pending.  She does have a history of malignancy as well and must consider an SIADH process.  Ordering serum osmolality as well as urine lytes, creatinine, and osmolality.    Clinical Course as of 06/20/24 9178  Thu Jun 17, 2024  2000 CBC with Differential(!) Stable from prior [HN]  2002 Sodium(!!): 117 [HN]  2002 Potassium(!!): 2.5 [HN]  2002 Creatinine(!): 3.16 +AKI [HN]  2020 consulted to nephrology [HN]  2101 D/w Dr. Dolan with nephrology, who agrees with sodium chloride  bolus and maintenance rate, CT scan,  urine labs, and admit to hospitalist. [HN]  2255 CT Renal Stone Study 1. Wall thickening of the majority of the sigmoid colon with diverticula, increased in extent compared to prior study. No acute inflammation. Findings may be related to chronic sequelae of colitis or diverticulitis. An underlying neoplastic process is not excluded. 2. Single 4 mm calculus in the right kidney.   [HN]  2255 Consulted to medicine [HN]    Clinical Course User Index [HN] Franklyn Sid SAILOR, MD    Labs: I Ordered, and personally interpreted labs.  The pertinent results include:  those listed above  Imaging Studies ordered: I ordered imaging studies including CT abd pelvis w contrast I independently visualized and interpreted imaging. I agree with the radiologist interpretation  Additional history obtained from chart review, husband at bedside.    Cardiac Monitoring: The patient was maintained on a cardiac monitor.  I personally viewed and interpreted the cardiac monitored which showed an underlying rhythm of: atrial fibrillation  Reevaluation: After the interventions noted above, I reevaluated the patient and found that they have :improved  Social Determinants of Health: Lives independently  Disposition:  admit to hospitalist with nephrology following  Co morbidities that complicate the patient evaluation  Past Medical History:  Diagnosis Date   Adrenal adenoma    Arthritis    fingers, right shoulder (09/09/2017)   Atrial fibrillation (HCC) 12/2021   New diagnosis noted on pacemaker interrogation.   Back pain with radiation    Chronic Back Pain - mutliple surgeries (including tumor removal)   Bilateral edema of lower extremity    Chronic, likely related to venous stasis   Bradycardia    Pacemaker placed   CAD S/P percutaneous coronary angioplasty    a) LHC: 07/30/10. -- 3.0x63mm Integrity BMS to pRCA & 2.5x 15mm Integrity BMS mLAD(@ D2).  b) Class III-IV Angina 03/2011: LHC- ISR in LAD BMS  -- prox overlapping Promus DES 2.5x55mm and PTCA of jailed D2 ostium-prox 80%. c) 02/03/12:  LHC- patent stents.  Jailed diagonal. with stable flow; d) Peri-OP NSTEMI 04/2012 - LHC in 12/'13 -    Celiac artery stenosis    12/2016 - STENT placement   Complication of anesthesia    used to wake up wild years ago (09/09/2017)   Congestive heart failure with LV diastolic dysfunction, NYHA class 2 (HCC)    06/13/10:  last 2D echo-  EF >55%, Mild TR, Mod Conc LVH - Grade 1 diastolic dysfunction (abnormal relaxation) --> LVEDP on Cath 28 mmHg & mild 2nd Pulm HTN   Diet-controlled type 2 diabetes mellitus (HCC)    Diverticulitis of colon (without mention of hemorrhage)(562.11)    Diverticulosis    Dyslipidemia, goal LDL below 70    Intolerant to statins   GERD (gastroesophageal reflux disease)    Hepatitis ~ 1957   yellow jaundice (01/14/2017)   Hepatomegaly    Hiatal hernia    History of blood transfusion 04/2012   when I had a heart attack   History of kidney stones    I've got a stone embedded in one of my kidneys (09/09/2017)   History of radiation therapy    Left Knee - 03/05/23-04/30/23- Dr. Lynwood Nasuti   History of stomach ulcers years ago   Irritable bowel syndrome (IBS)  Labile essential hypertension    Partially related to RAS   Mesenteric artery stenosis    95% Celiac Artery - ostial, 20-30% SMA.  Bilateral Renal A: L RA stent patent, R RA 20-30% -- Conservative Management   Mobitz type 2 second degree AV block    Status post PPM placement   NSTEMI (non-ST elevated myocardial infarction) (HCC) 04/2012   Unclear the details, apparently this was postoperative from her back surgery that she was cleared for my last saw her in June.  Reportedly had stents placed   Pancreas divisum    On pancreatic enzyme   PAT (paroxysmal atrial tachycardia)    Renal artery stenosis 2011; 12/'13   a) Angiogram 02/03/12:  50-60%L RA stenosis, 40% R R Inferior artery; b) 12/'13: S/P L RA Stent (High  Pt. Reg) 6.0 mm x 15 mm; c) Renal Duplex 10/2013: <60% L RA, <60 R RA, ~60% SMA & Celiac A.   S/P placement of cardiac pacemaker 07/2021   Medtronic   Stricture and stenosis of esophagus    Thyroid  nodule    Unspecified gastritis and gastroduodenitis without mention of hemorrhage      Medicines Meds ordered this encounter  Medications   sodium chloride  0.9 % bolus 1,000 mL   ondansetron  (ZOFRAN ) injection 4 mg   potassium chloride  SA (KLOR-CON  M) CR tablet 40 mEq   potassium chloride  10 mEq in 100 mL IVPB    I have reviewed the patients home medicines and have made adjustments as needed  Problem List / ED Course: Problem List Items Addressed This Visit   None Visit Diagnoses       Hyponatremia    -  Primary     Hypokalemia         AKI (acute kidney injury)         Nausea and vomiting, unspecified vomiting type                       This note was created using dictation software, which may contain spelling or grammatical errors.    Franklyn Sid SAILOR, MD 06/20/24 [phone]

## 2024-06-17 NOTE — Telephone Encounter (Signed)
 Spoke with pt and advised her of results and told her she needed to go to the ER. Patient seemed hesitant and stated husband went back to the office. Offered to contact pt's husband and she declined stating that she would call him and inform him. Asked pt to call us  back and let us  know what ER she was going to so we can contact them and let them know they were coming. Pt stated she would CB after speaking to husband.

## 2024-06-17 NOTE — Telephone Encounter (Signed)
 Spoke with husband to reiterate results and instruction from Sarah. Husband stated that pt told him results however stated she was fine and would get better. Advised him that she needed to go to the ER as she would not get better per Lauraine Dais. Husband agreed to take pt. Called [facility] ER and spoke with charge nurse Ozell - advised him of situation and results. He is aware pt is coming to ER.

## 2024-06-17 NOTE — H&P (Addendum)
 History and Physical    Patient: [name] FMW:[id number] DOB: [birth year] DOA: 06/17/2024 DOS: the patient was seen and examined on 06/17/2024 PCP: Katrinka Garnette KIDD, MD  Patient coming from: Home  Chief Complaint:  Chief Complaint  Patient presents with   Abnormal Lab   HPI: [name] is a 83 y.o. female with medical history significant for atrial fib on Eliquis  and Amiodarone  and pacemaker who presents with 2 weeks of diarrhea and abdominal pain. She denies any fever or chills but she has had a poor appetite. She went to her primary doctor who drew labs and called her today to go to the ED when her results came back showing sodium of 118 and low potassium.  The patient just completed a 3 day course of Bactrim for UTI.  In the ED her sodium was 117 and her potassium 2.5.  She had worsening renal function with a potassium of 3.2 Her baseline creatinine equal 1.6. She clinically appeared dehydrated. She will be admitted to the hospitalist service for further management.   Review of Systems: As mentioned in the history of present illness. All other systems reviewed and are negative. Past Medical History:  Diagnosis Date   Adrenal adenoma    Arthritis    fingers, right shoulder (09/09/2017)   Atrial fibrillation (HCC) 12/2021   New diagnosis noted on pacemaker interrogation.   Back pain with radiation    Chronic Back Pain - mutliple surgeries (including tumor removal)   Bilateral edema of lower extremity    Chronic, likely related to venous stasis   Bradycardia    Pacemaker placed   CAD S/P percutaneous coronary angioplasty    a) LHC: 07/30/10. -- 3.0x42mm Integrity BMS to pRCA & 2.5x 15mm Integrity BMS mLAD(@ D2).  b) Class III-IV Angina 03/2011: LHC- ISR in LAD BMS -- prox overlapping Promus DES 2.5x60mm and PTCA of jailed D2 ostium-prox 80%. c) 02/03/12:  LHC- patent stents.  Jailed diagonal. with stable flow; d) Peri-OP NSTEMI 04/2012 - LHC in 12/'13 -    Celiac artery  stenosis    12/2016 - STENT placement   Complication of anesthesia    used to wake up wild years ago (09/09/2017)   Congestive heart failure with LV diastolic dysfunction, NYHA class 2 (HCC)    06/13/10:  last 2D echo-  EF >55%, Mild TR, Mod Conc LVH - Grade 1 diastolic dysfunction (abnormal relaxation) --> LVEDP on Cath 28 mmHg & mild 2nd Pulm HTN   Diet-controlled type 2 diabetes mellitus (HCC)    Diverticulitis of colon (without mention of hemorrhage)(562.11)    Diverticulosis    Dyslipidemia, goal LDL below 70    Intolerant to statins   GERD (gastroesophageal reflux disease)    Hepatitis ~ 1957   yellow jaundice (01/14/2017)   Hepatomegaly    Hiatal hernia    History of blood transfusion 04/2012   when I had a heart attack   History of kidney stones    I've got a stone embedded in one of my kidneys (09/09/2017)   History of radiation therapy    Left Knee - 03/05/23-04/30/23- Dr. Lynwood Nasuti   History of stomach ulcers years ago   Irritable bowel syndrome (IBS)    Labile essential hypertension    Partially related to RAS   Mesenteric artery stenosis    95% Celiac Artery - ostial, 20-30% SMA.  Bilateral Renal A: L RA stent patent, R RA 20-30% -- Conservative Management   Mobitz type 2  second degree AV block    Status post PPM placement   NSTEMI (non-ST elevated myocardial infarction) (HCC) 04/2012   Unclear the details, apparently this was postoperative from her back surgery that she was cleared for my last saw her in June.  Reportedly had stents placed   Pancreas divisum    On pancreatic enzyme   PAT (paroxysmal atrial tachycardia)    Renal artery stenosis 2011; 12/'13   a) Angiogram 02/03/12:  50-60%L RA stenosis, 40% R R Inferior artery; b) 12/'13: S/P L RA Stent ([facility]) 6.0 mm x 15 mm; c) Renal Duplex 10/2013: <60% L RA, <60 R RA, ~60% SMA & Celiac A.   S/P placement of cardiac pacemaker 07/2021   [employer]   Stricture and stenosis of esophagus    Thyroid  nodule     Unspecified gastritis and gastroduodenitis without mention of hemorrhage    Past Surgical History:  Procedure Laterality Date   APPENDECTOMY     BACK SURGERY     BREAST DUCTAL SYSTEM EXCISION     right   BREAST SURGERY Right    CARDIAC CATHETERIZATION N/A 03/16/2015   Procedure: Left Heart Cath and Coronary Angiography;  Surgeon: Alm LELON Clay, MD;  Location: [facility] INVASIVE CV LAB;  Service: Cardiovascular; Widely patent m-dLAD stents as wellas pRCA stent.  High LVEDP, small Diag & Om vessels with moderate stenosis    Cardiac Event Monitor  01/2017   Mostly NSR with occasional sinus tachycardia and rare bradycardia. No A. fib. No PND or PSVT. Rare PACs and PVCs.   CATARACT EXTRACTION W/ INTRAOCULAR LENS  IMPLANT, BILATERAL Bilateral    CHOLECYSTECTOMY OPEN     COLONOSCOPY  03/01/2009   normal    CORONARY ANGIOPLASTY WITH STENT PLACEMENT  07/30/2010   3.0x12mm Integrity BMS to RCA and 2.5x 15mm Integrity BMS LAD.     CORONARY ANGIOPLASTY WITH STENT PLACEMENT  03/18/2011   Cutting Balloon PTCA of D2 (jailed - 80% ostial stenosis), DES PCI of mid LAD ISR - > Promus DES 2.5 x 16 mm postdilated to 2.8 mm) covering the proximal portion of the previous stent   CORONARY STENT INTERVENTION N/A 04/20/2024   Procedure: CORONARY STENT INTERVENTION;  Surgeon: Clay Alm LELON, MD;  Location: [facility] INVASIVE CV LAB;  Service: CV:;; Successful 2 site PCI of the RCA with a proximal (Synergy XD 3.0 mm x 16 mm-->3.3 mm) and distal (Synergy XD 3.0 mm x 20 mm -> 3.3 mm)   DOBUTAMINE  STRESS ECHO  03/07/2015   [facility] (ordered for pre-op evaluation for EUS/ERCP --> abnormal EKG:  strreesss test shhoowwed 1 mm ST segment depressions downsloping. No wall motion abnormalities at peak exercise or at rest. Diastolic dysfunction was noted but normal systolic function - EF greater than 55%. No bouts of regurgitation or stenosis. Resting hypertension with exaggerated response   ESOPHAGOGASTRODUODENOSCOPY  04/08/2012    ESOPHAGOGASTRODUODENOSCOPY (EGD) WITH ESOPHAGEAL DILATION  X 2   FRACTURE SURGERY     HAMMER TOE SURGERY Bilateral    took bone off the top of 2nd toe on each foot   HIP SURGERY Left    something to do w/my back   JOINT REPLACEMENT     KNEE SURGERY Left    had fluid drained off it a couple times   LEFT HEART CATH AND CORONARY ANGIOGRAPHY  07/30/2010   severe LAD-diagonal 80%, moderate to severe proximal RCA.  Mean PAP 15 mmHg.  PCWP mean 17 mmHg.  RVP 45/11 mmHg.  PAP  47/24 mmHg, mean 31 mmHg.  LVEDP 27 mmHg   LEFT HEART CATH AND CORONARY ANGIOGRAPHY N/A 04/20/2024   Procedure: LEFT HEART CATH AND CORONARY ANGIOGRAPHY;  Surgeon: Anner Alm ORN, MD;  Location: [facility] INVASIVE CV LAB;  Service: CV: wo-vessel CAD involving RCA and LAD.  Widely patent over lapping LAD stents (BMS and DES); ostial proximal RCA 60 to 70% followed by widely patent BMS stent & segmental 80% stenosis distal to stent.  High bifurcation of RCA into a large RPAV-PL &PDA. LVEDP ~20 -22 mmHg.   LEFT HEART CATHETERIZATION WITH CORONARY ANGIOGRAM N/A 02/03/2012   Procedure: LEFT HEART CATHETERIZATION WITH CORONARY ANGIOGRAM;  Surgeon: Alm ORN Anner, MD;  Location: [facility] CATH LAB; widely patent LAD and RCA stents.  Patent D2 ostium.  Moderate L Renal A stenosis (56%), R Renal A 40-50% t.  LVEDP 20 mmHg.   LEFT HEART CATHETERIZATION WITH CORONARY ANGIOGRAM N/A 05/12/2014   Procedure: LEFT HEART CATHETERIZATION WITH CORONARY ANGIOGRAM;  Surgeon: Dorn JINNY Lesches, MD;  Location: [facility] CATH LAB;  Service: Cardiovascular: Stable CAD. Patent stents. Patent renal artery stent   LEFT HEART CATHETERIZATION WITH CORONARY ANGIOGRAM  08/2012   Peri-Op MI @ [facility] -- 40% ostial D1, patent LAD stents, 10% RCA ISR   LEFT HEART CATHETERIZATION WITH CORONARY ANGIOGRAM   03/18/2011   70% ISR of LAD stent just after D2 (D2 has ostial 80 to 90% stenosis.  20 to 30% ISR RCA.  EDP elevated at 28 mmHg   NM MYOVIEW  LTD  12/2016   LOW RISK  study. No ischemia or infarction. EF 65-75%.   NM MYOVIEW  LTD  01/05/2020    LOW RISK. EF 60-65%.  No EKG changes.  No infarct, no ischemia.   PACEMAKER IMPLANT N/A 07/09/2021   Procedure: PACEMAKER IMPLANT;  Surgeon: Francyne Headland, MD;  Location: [facility] INVASIVE CV LAB;  Service: Cardiovascular;  Laterality: N/A;   PANCREAS SURGERY     stent in my pancreas; Dr Essie Minor   PERCUTANEOUS PINNING PHALANX FRACTURE OF HAND Left ~ 2013   PERIPHERAL VASCULAR BALLOON ANGIOPLASTY  09/09/2017   Procedure: PERIPHERAL VASCULAR BALLOON ANGIOPLASTY;  Surgeon: Serene Gaile ORN, MD;  Location: [facility] INVASIVE CV LAB;  Service: Cardiovascular;;  Celiac instent   PERIPHERAL VASCULAR INTERVENTION  01/14/2017   Procedure: Peripheral Vascular Intervention;  Surgeon: Serene Gaile ORN, MD;  Location: [facility] INVASIVE CV LAB;  Service: Cardiovascular;;  mesentric   RENAL ARTERY STENT Left 08/2012   @ [facility] - 6.0 mm x 15 mm   SPINE SURGERY     tumor removed 07/2010; Redo Surgery 04/2012; Sacroiliac Sgx 08/2013   TOTAL ABDOMINAL HYSTERECTOMY     TOTAL KNEE ARTHROPLASTY Left ~ 2012   TRANSTHORACIC ECHOCARDIOGRAM  07/10/2021   (Postop PPM): Normal LV size and function.  EF 60 to 65%.  No RWMA.  GR 1 DD.  Mild LA dilation.  Normal RV size and function.  Normal RAP.  Normal aortic and mitral valves.  No pericardial effusion.   VISCERAL ANGIOGRAM N/A 05/12/2014   Procedure: VISCERAL ANGIOGRAM;  Surgeon: Dorn JINNY Lesches, MD;  Location: [facility] CATH LAB;  25% ostial proximal celiac artery with downward takeoff.  23% proximal SMA and 56% proximal IMA.  Left renal artery stent widely patent.  Right renal artery is 20 to 30% proximal stenosis.   VISCERAL ANGIOGRAPHY N/A 01/14/2017   Procedure: Mesenteric  Angiography;  Surgeon: Serene Gaile ORN, MD;  Location: [facility] INVASIVE CV LAB;  Service: Cardiovascular;  Laterality: N/A;   VISCERAL ANGIOGRAPHY N/A 09/09/2017   Procedure: VISCERAL ANGIOGRAPHY;  Surgeon: Serene Gaile ORN,  MD;  Location: [facility] INVASIVE CV LAB;  Service: Cardiovascular;  Laterality: N/A;   Social History:  reports that she has never smoked. She has never used smokeless tobacco. She reports that she does not drink alcohol and does not use drugs.  Allergies  Allergen Reactions   Codeine Phosphate Anaphylaxis, Shortness Of Breath and Swelling   Contrast Media [Iodinated Contrast Media] Swelling    Swelling of the face. Reaction was to ionic contrast many years ago. Patient has had non-ionic contrast many time without pre medication and has had no reaction.    Statins Other (See Comments)    Hyperactivity   Tramadol Palpitations and Other (See Comments)    Hyperactivity    Family History  Problem Relation Age of Onset   Stroke Mother    Cancer Father        mets   Heart attack Father    Heart disease Father    Heart attack Brother    Diabetes Brother    Heart attack Brother    Colitis Maternal Grandfather    Cancer Daughter    Colon cancer Neg Hx    Stomach cancer Neg Hx     Prior to Admission medications   Medication Sig Start Date End Date Taking? Authorizing Provider  allopurinol  (ZYLOPRIM ) 100 MG tablet TAKE 1 TABLET BY MOUTH EVERY DAY 11/28/23  Yes Katrinka Garnette KIDD, MD  amiodarone  (PACERONE ) 200 MG tablet TAKE 400 MG (2 TABLETS) ONE DOSE AS NEED FOR A FAST HEART RATE ,IF AFTER 12 HOURS STILL PRESENT TAKE 200 MG ( 1 TABLET) ONE DOSE FOR EACH EPISODE 05/16/23  Yes Anner Alm ORN, MD  apixaban  (ELIQUIS ) 5 MG TABS tablet Take 1 tablet (5 mg total) by mouth 2 (two) times daily. 09/19/23  Yes Croitoru, Mihai, MD  Artificial Tear Solution (TEARS RENEWED OP) Apply 1 drop to eye daily as needed (dry eyes).   Yes [provider]  bismuth  subsalicylate (PEPTO BISMOL) 262 MG/15ML suspension Take 30 mLs by mouth every 6 (six) hours as needed for diarrhea or loose stools.   Yes [provider]  carvedilol  (COREG ) 6.25 MG tablet Take 1 tablet (6.25 mg total) by mouth 2 (two) times  daily. 05/17/24  Yes Anner Alm ORN, MD  clopidogrel  (PLAVIX ) 75 MG tablet Take 1 tablet (75 mg total) by mouth daily with breakfast. 04/22/24  Yes Henry Shaver B, NP  colchicine 0.6 MG tablet Take 0.6 mg by mouth daily as needed (Gout). 03/15/22  Yes [provider]  cyanocobalamin  (VITAMIN B12) 1000 MCG tablet Take 1,000 mcg by mouth daily.   Yes [provider]  diclofenac sodium (VOLTAREN) 1 % GEL Apply 2 g topically 4 (four) times daily as needed (pain). 02/23/19  Yes [provider]  furosemide  (LASIX ) 80 MG tablet Take 1 tablet (80 mg total) by mouth 2 (two) times daily. May take a additional if weight is 3 lbs or more with daily weight Patient taking differently: Take 80 mg by mouth in the morning, at noon, and at bedtime. May take a additional if weight is 3 lbs or more with daily weight 05/17/24  Yes Anner Alm ORN, MD  HYDROcodone -acetaminophen  (NORCO/VICODIN) 5-325 MG tablet Take 1 tablet by mouth every 6 (six) hours as needed for moderate pain (pain score 4-6). 06/01/24  Yes Katrinka Garnette KIDD, MD  hyoscyamine  (ANASPAZ ) 0.125 MG  TBDP disintergrating tablet Place 1 tablet (0.125 mg total) under the tongue every 6 (six) hours as needed (abdominal cramping). 02/19/24  Yes Katrinka Garnette KIDD, MD  indapamide  (LOZOL ) 2.5 MG tablet Take 1 tablet (2.5 mg total) by mouth daily. 05/31/24  Yes Anner Alm ORN, MD  isosorbide  mononitrate (IMDUR ) 60 MG 24 hr tablet Take 1 tablet (60 mg total) by mouth daily. May take an extra dose if you use Nitroglycerin   sublingual tablet for 2 days afterwards 09/29/23  Yes Anner Alm ORN, MD  lidocaine  (XYLOCAINE ) 5 % ointment Apply 1 application. topically as needed. 11/27/21  Yes Long, Fonda MATSU, MD  losartan  (COZAAR ) 50 MG tablet TAKE 1 TABLET BY MOUTH EVERY DAY 11/13/23  Yes Anner Alm ORN, MD  meclizine  (ANTIVERT ) 12.5 MG tablet Take 1 tablet (12.5 mg total) by mouth 2 (two) times daily as needed for dizziness. 02/19/24  Yes Katrinka Garnette KIDD, MD  nitroGLYCERIN  (NITROSTAT ) 0.4 MG SL tablet DISSOLVE 1 TABLET UNDER THE TONGUE EVERY 5 MINUTES AS NEEDED FOR CHEST PAIN 04/21/24  Yes Henry Shaver B, NP  spironolactone  (ALDACTONE ) 25 MG tablet TAKE 1 TABLET BY MOUTH AS DIRECTED. TAKE 1 TABLET ON MONDAYS, WEDNESDAYS, AND FRIDAYS AT 6 PM. 02/18/24  Yes Anner Alm ORN, MD  nystatin  (MYCOSTATIN /NYSTOP ) powder Apply 1 Application topically 3 (three) times daily. 06/16/23   Timmy Maude SAUNDERS, MD  sulfamethoxazole-trimethoprim (BACTRIM) 400-80 MG tablet Take 1 tablet by mouth 2 (two) times daily. Patient not taking: Reported on 06/17/2024 06/10/24   Katrinka Garnette KIDD, MD    Physical Exam: Vitals:   06/17/24 2115 06/17/24 2130 06/17/24 2200 06/17/24 2300  BP: (!) 115/36 (!) 117/50 (!) 139/54 (!) 127/52  Pulse: 72 74 70 66  Resp: 15 15 17  (!) 21  Temp: 97.6 F (36.4 C)     TempSrc: Oral     SpO2: 97% 94% 96% 95%   Physical Exam:  General: No acute distress, well developed, well nourished, pale HEENT: Normocephalic, atraumatic, PERRL Cardiovascular: Normal rate and rhythm. SM, Distal pulses intact. Pulmonary: Normal pulmonary effort, normal breath sounds Gastrointestinal: Nondistended abdomen, soft, tender in RLQ, normoactive bowel sounds Musculoskeletal:Normal ROM, no lower ext edema Lymphadenopathy: No cervical LAD. Skin: Skin is warm and dry. Neuro: No focal deficits noted, AAOx3. PSYCH: Attentive and cooperative  Data Reviewed:  Results for orders placed or performed during the hospital encounter of 06/17/24 (from the past 24 hours)  CBC with Differential     Status: Abnormal   Collection Time: 06/17/24  7:26 PM  Result Value Ref Range   WBC 9.0 4.0 - 10.5 K/uL   RBC 3.47 (L) 3.87 - 5.11 MIL/uL   Hemoglobin 10.5 (L) 12.0 - 15.0 g/dL   HCT 69.4 (L) 63.9 - 53.9 %   MCV 87.9 80.0 - 100.0 fL   MCH 30.3 26.0 - 34.0 pg   MCHC 34.4 30.0 - 36.0 g/dL   RDW 85.6 88.4 - 84.4 %   Platelets 301 150 - 400 K/uL   nRBC 0.0  0.0 - 0.2 %   Neutrophils Relative % 71 %   Neutro Abs 6.4 1.7 - 7.7 K/uL   Lymphocytes Relative 14 %   Lymphs Abs 1.3 0.7 - 4.0 K/uL   Monocytes Relative 12 %   Monocytes Absolute 1.1 (H) 0.1 - 1.0 K/uL   Eosinophils Relative 3 %   Eosinophils Absolute 0.3 0.0 - 0.5 K/uL   Basophils Relative 0 %   Basophils Absolute 0.0 0.0 - 0.1 K/uL  Immature Granulocytes 0 %   Abs Immature Granulocytes 0.03 0.00 - 0.07 K/uL  Comprehensive metabolic panel     Status: Abnormal   Collection Time: 06/17/24  7:26 PM  Result Value Ref Range   Sodium 117 (LL) 135 - 145 mmol/L   Potassium 2.5 (LL) 3.5 - 5.1 mmol/L   Chloride 77 (L) 98 - 111 mmol/L   CO2 25 22 - 32 mmol/L   Glucose, Bld 125 (H) 70 - 99 mg/dL   BUN 90 (H) 8 - 23 mg/dL   Creatinine, Ser 6.83 (H) 0.44 - 1.00 mg/dL   Calcium 9.5 8.9 - 89.6 mg/dL   Total Protein 6.8 6.5 - 8.1 g/dL   Albumin 4.2 3.5 - 5.0 g/dL   AST 20 15 - 41 U/L   ALT 16 0 - 44 U/L   Alkaline Phosphatase 70 38 - 126 U/L   Total Bilirubin 0.5 0.0 - 1.2 mg/dL   GFR, Estimated 14 (L) >60 mL/min   Anion gap 15 5 - 15  Magnesium      Status: Abnormal   Collection Time: 06/17/24  8:12 PM  Result Value Ref Range   Magnesium  2.9 (H) 1.7 - 2.4 mg/dL  Sodium, urine, random     Status: None   Collection Time: 06/17/24 10:49 PM  Result Value Ref Range   Sodium, Ur <30 mmol/L  Creatinine, urine, random     Status: None   Collection Time: 06/17/24 10:49 PM  Result Value Ref Range   Creatinine, Urine 56 mg/dL  Urinalysis, Routine w reflex microscopic -Urine, Clean Catch     Status: Abnormal   Collection Time: 06/17/24 10:49 PM  Result Value Ref Range   Color, Urine STRAW (A) YELLOW   APPearance CLEAR CLEAR   Specific Gravity, Urine 1.008 1.005 - 1.030   pH 6.0 5.0 - 8.0   Glucose, UA NEGATIVE NEGATIVE mg/dL   Hgb urine dipstick NEGATIVE NEGATIVE   Bilirubin Urine NEGATIVE NEGATIVE   Ketones, ur NEGATIVE NEGATIVE mg/dL   Protein, ur NEGATIVE NEGATIVE mg/dL   Nitrite  NEGATIVE NEGATIVE   Leukocytes,Ua NEGATIVE NEGATIVE     Assessment and Plan: 1. Colitis w 2 weeks of diarrhea 2. Severe hyponatremia likely due to hypovolemia 3. Hypokalemia  - IV fluids - Replete electrolytes -   4. Acute on chronic kidney disease Creatinine is 3.6. Baseline=  1.66. - Monitor  5. H/o CHF with preserved EF- Resume   6. CAD s/p RCA stent   7. Atrial fibrillation - Continue Eliquis  and carvedilol    Advance Care Planning:   Code Status: Full Code the patient names her husband is her surrogate decision maker and she wants to be full code.  Consults: None  Family Communication: None  Severity of Illness: The appropriate patient status for this patient is INPATIENT. Inpatient status is judged to be reasonable and necessary in order to provide the required intensity of service to ensure the patient's safety. The patient's presenting symptoms, physical exam findings, and initial radiographic and laboratory data in the context of their chronic comorbidities is felt to place them at high risk for further clinical deterioration. Furthermore, it is not anticipated that the patient will be medically stable for discharge from the hospital within 2 midnights of admission.   * I certify that at the point of admission it is my clinical judgment that the patient will require inpatient hospital care spanning beyond 2 midnights from the point of admission due to high intensity of service, high risk  for further deterioration and high frequency of surveillance required.*  Author: ARTHEA CHILD, MD 06/17/2024 11:53 PM  For on call review [email or web address].

## 2024-06-17 NOTE — ED Notes (Signed)
 Patient transported to CT

## 2024-06-17 NOTE — ED Provider Triage Note (Signed)
 Emergency Medicine Provider Triage Evaluation Note  [name] , a 83 y.o. female  was evaluated in triage.  Pt complains of abnormal labs.  Review of Systems  Positive:  Negative:   Physical Exam  BP (!) 139/49 (BP Location: Right Arm)   Pulse 67   Temp 98.1 F (36.7 C) (Oral)   Resp 16   SpO2 94%  Gen:   Awake, no distress   Resp:  Normal effort  MSK:   Moves extremities without difficulty  Other:    Medical Decision Making  Medically screening exam initiated at 6:53 PM.  Appropriate orders placed.  URVI IMES was informed that the remainder of the evaluation will be completed by another provider, this initial triage assessment does not replace that evaluation, and the importance of remaining in the ED until their evaluation is complete.  Patient seen for routine visit by hem/onc (h/o sarcoma) and had labs done. She was contacted later in the day with significantly abnormal labs and told to come to the ED.  Na 118 K+ 2.7 Cr 3.22  She reports significant N, V, D for the past 3-4 days. No pain, no fever.    Odell Balls, PA-C 06/17/24 [id number]

## 2024-06-17 NOTE — Telephone Encounter (Signed)
 Critical lab results received from [facility] in ER lab. Pt had a sodium level of 118 and potassium of 2.7. Per Lauraine Dais: pt needs to go to ER. I have tried to contact pt and husband several times with no answer. A VM was left on both numbers a file requesting a CB.

## 2024-06-17 NOTE — H&P (Incomplete)
 History and Physical    Patient: [name] FMW:[id number] DOB: October 09, 1940 DOA: 06/17/2024 DOS: the patient was seen and examined on 06/17/2024 PCP: Katrinka Garnette KIDD, MD  Patient coming from: Home  Chief Complaint:  Chief Complaint  Patient presents with  . Abnormal Lab   HPI: [name] is a 83 y.o. female with medical history significant of ***      Review of Systems: As mentioned in the history of present illness. All other systems reviewed and are negative. Past Medical History:  Diagnosis Date  . Adrenal adenoma   . Arthritis    fingers, right shoulder (09/09/2017)  . Atrial fibrillation (HCC) 12/2021   New diagnosis noted on pacemaker interrogation.  . Back pain with radiation    Chronic Back Pain - mutliple surgeries (including tumor removal)  . Bilateral edema of lower extremity    Chronic, likely related to venous stasis  . Bradycardia    Pacemaker placed  . CAD S/P percutaneous coronary angioplasty    a) LHC: 07/30/10. -- 3.0x21mm Integrity BMS to pRCA & 2.5x 15mm Integrity BMS mLAD(@ D2).  b) Class III-IV Angina 03/2011: LHC- ISR in LAD BMS -- prox overlapping Promus DES 2.5x36mm and PTCA of jailed D2 ostium-prox 80%. c) 02/03/12:  LHC- patent stents.  Jailed diagonal. with stable flow; d) Peri-OP NSTEMI 04/2012 - LHC in 12/'13 -   . Celiac artery stenosis    12/2016 - STENT placement  . Complication of anesthesia    used to wake up wild years ago (09/09/2017)  . Congestive heart failure with LV diastolic dysfunction, NYHA class 2 (HCC)    06/13/10:  last 2D echo-  EF >55%, Mild TR, Mod Conc LVH - Grade 1 diastolic dysfunction (abnormal relaxation) --> LVEDP on Cath 28 mmHg & mild 2nd Pulm HTN  . Diet-controlled type 2 diabetes mellitus (HCC)   . Diverticulitis of colon (without mention of hemorrhage)(562.11)   . Diverticulosis   . Dyslipidemia, goal LDL below 70    Intolerant to statins  . GERD (gastroesophageal reflux disease)   . Hepatitis ~ 1957    yellow jaundice (01/14/2017)  . Hepatomegaly   . Hiatal hernia   . History of blood transfusion 04/2012   when I had a heart attack  . History of kidney stones    I've got a stone embedded in one of my kidneys (09/09/2017)  . History of radiation therapy    Left Knee - 03/05/23-04/30/23- Dr. [place] Nasuti  . History of stomach ulcers years ago  . Irritable bowel syndrome (IBS)   . Labile essential hypertension    Partially related to RAS  . Mesenteric artery stenosis    95% Celiac Artery - ostial, 20-30% SMA.  Bilateral Renal A: L RA stent patent, R RA 20-30% -- Conservative Management  . Mobitz type 2 second degree AV block    Status post PPM placement  . NSTEMI (non-ST elevated myocardial infarction) (HCC) 04/2012   Unclear the details, apparently this was postoperative from her back surgery that she was cleared for my last saw her in June.  Reportedly had stents placed  . Pancreas divisum    On pancreatic enzyme  . PAT (paroxysmal atrial tachycardia)   . Renal artery stenosis 2011; 12/'13   a) Angiogram 02/03/12:  50-60%L RA stenosis, 40% R R Inferior artery; b) 12/'13: S/P L RA Stent ([facility]) 6.0 mm x 15 mm; c) Renal Duplex 10/2013: <60% L RA, <60 R RA, ~60% SMA &  Celiac A.  . S/P placement of cardiac pacemaker 07/2021   [employer]  . Stricture and stenosis of esophagus   . Thyroid  nodule   . Unspecified gastritis and gastroduodenitis without mention of hemorrhage    Past Surgical History:  Procedure Laterality Date  . APPENDECTOMY    . BACK SURGERY    . BREAST DUCTAL SYSTEM EXCISION     right  . BREAST SURGERY Right   . CARDIAC CATHETERIZATION N/A 03/16/2015   Procedure: Left Heart Cath and Coronary Angiography;  Surgeon: Alm LELON Clay, MD;  Location: [facility] INVASIVE CV LAB;  Service: Cardiovascular; Widely patent m-dLAD stents as wellas pRCA stent.  High LVEDP, small Diag & Om vessels with moderate stenosis   . Cardiac Event Monitor  01/2017   Mostly NSR with  occasional sinus tachycardia and rare bradycardia. No A. fib. No PND or PSVT. Rare PACs and PVCs.  SABRA CATARACT EXTRACTION W/ INTRAOCULAR LENS  IMPLANT, BILATERAL Bilateral   . CHOLECYSTECTOMY OPEN    . COLONOSCOPY  03/01/2009   normal   . CORONARY ANGIOPLASTY WITH STENT PLACEMENT  07/30/2010   3.0x24mm Integrity BMS to RCA and 2.5x 15mm Integrity BMS LAD.    SABRA CORONARY ANGIOPLASTY WITH STENT PLACEMENT  03/18/2011   Cutting Balloon PTCA of D2 (jailed - 80% ostial stenosis), DES PCI of mid LAD ISR - > Promus DES 2.5 x 16 mm postdilated to 2.8 mm) covering the proximal portion of the previous stent  . CORONARY STENT INTERVENTION N/A 04/20/2024   Procedure: CORONARY STENT INTERVENTION;  Surgeon: Clay Alm LELON, MD;  Location: [facility] INVASIVE CV LAB;  Service: CV:;; Successful 2 site PCI of the RCA with a proximal (Synergy XD 3.0 mm x 16 mm-->3.3 mm) and distal (Synergy XD 3.0 mm x 20 mm -> 3.3 mm)  . DOBUTAMINE  STRESS ECHO  03/07/2015   [facility] (ordered for pre-op evaluation for EUS/ERCP --> abnormal EKG:  strreesss test shhoowwed 1 mm ST segment depressions downsloping. No wall motion abnormalities at peak exercise or at rest. Diastolic dysfunction was noted but normal systolic function - EF greater than 55%. No bouts of regurgitation or stenosis. Resting hypertension with exaggerated response  . ESOPHAGOGASTRODUODENOSCOPY  04/08/2012  . ESOPHAGOGASTRODUODENOSCOPY (EGD) WITH ESOPHAGEAL DILATION  X 2  . FRACTURE SURGERY    . HAMMER TOE SURGERY Bilateral    took bone off the top of 2nd toe on each foot  . HIP SURGERY Left    something to do w/my back  . JOINT REPLACEMENT    . KNEE SURGERY Left    had fluid drained off it a couple times  . LEFT HEART CATH AND CORONARY ANGIOGRAPHY  07/30/2010   severe LAD-diagonal 80%, moderate to severe proximal RCA.  Mean PAP 15 mmHg.  PCWP mean 17 mmHg.  RVP 45/11 mmHg.  PAP 47/24 mmHg, mean 31 mmHg.  LVEDP 27 mmHg  . LEFT HEART CATH AND CORONARY ANGIOGRAPHY N/A  04/20/2024   Procedure: LEFT HEART CATH AND CORONARY ANGIOGRAPHY;  Surgeon: Clay Alm LELON, MD;  Location: [facility] INVASIVE CV LAB;  Service: CV: wo-vessel CAD involving RCA and LAD.  Widely patent over lapping LAD stents (BMS and DES); ostial proximal RCA 60 to 70% followed by widely patent BMS stent & segmental 80% stenosis distal to stent.  High bifurcation of RCA into a large RPAV-PL &PDA. LVEDP ~20 -22 mmHg.  SABRA LEFT HEART CATHETERIZATION WITH CORONARY ANGIOGRAM N/A 02/03/2012   Procedure: LEFT HEART CATHETERIZATION WITH CORONARY ANGIOGRAM;  Surgeon: Alm  LELON Clay, MD;  Location: [facility] CATH LAB; widely patent LAD and RCA stents.  Patent D2 ostium.  Moderate L Renal A stenosis (56%), R Renal A 40-50% t.  LVEDP 20 mmHg.  SABRA LEFT HEART CATHETERIZATION WITH CORONARY ANGIOGRAM N/A 05/12/2014   Procedure: LEFT HEART CATHETERIZATION WITH CORONARY ANGIOGRAM;  Surgeon: Dorn JINNY Lesches, MD;  Location: [facility] CATH LAB;  Service: Cardiovascular: Stable CAD. Patent stents. Patent renal artery stent  . LEFT HEART CATHETERIZATION WITH CORONARY ANGIOGRAM  08/2012   Peri-Op MI @ [facility] -- 40% ostial D1, patent LAD stents, 10% RCA ISR  . LEFT HEART CATHETERIZATION WITH CORONARY ANGIOGRAM   03/18/2011   70% ISR of LAD stent just after D2 (D2 has ostial 80 to 90% stenosis.  20 to 30% ISR RCA.  EDP elevated at 28 mmHg  . NM MYOVIEW  LTD  12/2016   LOW RISK study. No ischemia or infarction. EF 65-75%.  . NM MYOVIEW  LTD  01/05/2020    LOW RISK. EF 60-65%.  No EKG changes.  No infarct, no ischemia.  SABRA PACEMAKER IMPLANT N/A 07/09/2021   Procedure: PACEMAKER IMPLANT;  Surgeon: Francyne Headland, MD;  Location: [facility] INVASIVE CV LAB;  Service: Cardiovascular;  Laterality: N/A;  . PANCREAS SURGERY     stent in my pancreas; Dr Essie Minor  . PERCUTANEOUS PINNING PHALANX FRACTURE OF HAND Left ~ 2013  . PERIPHERAL VASCULAR BALLOON ANGIOPLASTY  09/09/2017   Procedure: PERIPHERAL VASCULAR BALLOON ANGIOPLASTY;  Surgeon:  Serene Gaile LELON, MD;  Location: [facility] INVASIVE CV LAB;  Service: Cardiovascular;;  Celiac instent  . PERIPHERAL VASCULAR INTERVENTION  01/14/2017   Procedure: Peripheral Vascular Intervention;  Surgeon: Serene Gaile LELON, MD;  Location: [facility] INVASIVE CV LAB;  Service: Cardiovascular;;  mesentric  . RENAL ARTERY STENT Left 08/2012   @ [facility] - 6.0 mm x 15 mm  . SPINE SURGERY     tumor removed 07/2010; Redo Surgery 04/2012; Sacroiliac Sgx 08/2013  . TOTAL ABDOMINAL HYSTERECTOMY    . TOTAL KNEE ARTHROPLASTY Left ~ 2012  . TRANSTHORACIC ECHOCARDIOGRAM  07/10/2021   (Postop PPM): Normal LV size and function.  EF 60 to 65%.  No RWMA.  GR 1 DD.  Mild LA dilation.  Normal RV size and function.  Normal RAP.  Normal aortic and mitral valves.  No pericardial effusion.  SABRA VISCERAL ANGIOGRAM N/A 05/12/2014   Procedure: VISCERAL ANGIOGRAM;  Surgeon: Dorn JINNY Lesches, MD;  Location: [facility] CATH LAB;  25% ostial proximal celiac artery with downward takeoff.  23% proximal SMA and 56% proximal IMA.  Left renal artery stent widely patent.  Right renal artery is 20 to 30% proximal stenosis.  SABRA VISCERAL ANGIOGRAPHY N/A 01/14/2017   Procedure: Mesenteric  Angiography;  Surgeon: Serene Gaile LELON, MD;  Location: [facility] INVASIVE CV LAB;  Service: Cardiovascular;  Laterality: N/A;  . VISCERAL ANGIOGRAPHY N/A 09/09/2017   Procedure: VISCERAL ANGIOGRAPHY;  Surgeon: Serene Gaile LELON, MD;  Location: [facility] INVASIVE CV LAB;  Service: Cardiovascular;  Laterality: N/A;   Social History:  reports that she has never smoked. She has never used smokeless tobacco. She reports that she does not drink alcohol and does not use drugs.  Allergies  Allergen Reactions  . Codeine Phosphate Anaphylaxis, Shortness Of Breath and Swelling  . Contrast Media [Iodinated Contrast Media] Swelling    Swelling of the face. Reaction was to ionic contrast many years ago. Patient has had non-ionic contrast many time without pre medication and has  had no  reaction.   . Statins Other (See Comments)    Hyperactivity  . Tramadol Palpitations and Other (See Comments)    Hyperactivity    Family History  Problem Relation Age of Onset  . Stroke Mother   . Cancer Father        mets  . Heart attack Father   . Heart disease Father   . Heart attack Brother   . Diabetes Brother   . Heart attack Brother   . Colitis Maternal Grandfather   . Cancer Daughter   . Colon cancer Neg Hx   . Stomach cancer Neg Hx     Prior to Admission medications   Medication Sig Start Date End Date Taking? Authorizing Provider  allopurinol  (ZYLOPRIM ) 100 MG tablet TAKE 1 TABLET BY MOUTH EVERY DAY 11/28/23  Yes Katrinka Garnette KIDD, MD  amiodarone  (PACERONE ) 200 MG tablet TAKE 400 MG (2 TABLETS) ONE DOSE AS NEED FOR A FAST HEART RATE ,IF AFTER 12 HOURS STILL PRESENT TAKE 200 MG ( 1 TABLET) ONE DOSE FOR EACH EPISODE 05/16/23  Yes Anner Alm ORN, MD  apixaban  (ELIQUIS ) 5 MG TABS tablet Take 1 tablet (5 mg total) by mouth 2 (two) times daily. 09/19/23  Yes Croitoru, Mihai, MD  Artificial Tear Solution (TEARS RENEWED OP) Apply 1 drop to eye daily as needed (dry eyes).   Yes [provider]  bismuth  subsalicylate (PEPTO BISMOL) 262 MG/15ML suspension Take 30 mLs by mouth every 6 (six) hours as needed for diarrhea or loose stools.   Yes [provider]  carvedilol  (COREG ) 6.25 MG tablet Take 1 tablet (6.25 mg total) by mouth 2 (two) times daily. 05/17/24  Yes Anner Alm ORN, MD  clopidogrel  (PLAVIX ) 75 MG tablet Take 1 tablet (75 mg total) by mouth daily with breakfast. 04/22/24  Yes Henry Shaver B, NP  colchicine 0.6 MG tablet Take 0.6 mg by mouth daily as needed (Gout). 03/15/22  Yes [provider]  cyanocobalamin  (VITAMIN B12) 1000 MCG tablet Take 1,000 mcg by mouth daily.   Yes [provider]  diclofenac sodium (VOLTAREN) 1 % GEL Apply 2 g topically 4 (four) times daily as needed (pain). 02/23/19  Yes [provider]   furosemide  (LASIX ) 80 MG tablet Take 1 tablet (80 mg total) by mouth 2 (two) times daily. May take a additional if weight is 3 lbs or more with daily weight Patient taking differently: Take 80 mg by mouth in the morning, at noon, and at bedtime. May take a additional if weight is 3 lbs or more with daily weight 05/17/24  Yes Anner Alm ORN, MD  HYDROcodone -acetaminophen  (NORCO/VICODIN) 5-325 MG tablet Take 1 tablet by mouth every 6 (six) hours as needed for moderate pain (pain score 4-6). 06/01/24  Yes Katrinka Garnette KIDD, MD  hyoscyamine  (ANASPAZ ) 0.125 MG TBDP disintergrating tablet Place 1 tablet (0.125 mg total) under the tongue every 6 (six) hours as needed (abdominal cramping). 02/19/24  Yes Katrinka Garnette KIDD, MD  indapamide  (LOZOL ) 2.5 MG tablet Take 1 tablet (2.5 mg total) by mouth daily. 05/31/24  Yes Anner Alm ORN, MD  isosorbide  mononitrate (IMDUR ) 60 MG 24 hr tablet Take 1 tablet (60 mg total) by mouth daily. May take an extra dose if you use Nitroglycerin   sublingual tablet for 2 days afterwards 09/29/23  Yes Anner Alm ORN, MD  lidocaine  (XYLOCAINE ) 5 % ointment Apply 1 application. topically as needed. 11/27/21  Yes Long, Joshua G, MD  losartan  (COZAAR ) 50 MG  tablet TAKE 1 TABLET BY MOUTH EVERY DAY 11/13/23  Yes Anner Alm ORN, MD  meclizine  (ANTIVERT ) 12.5 MG tablet Take 1 tablet (12.5 mg total) by mouth 2 (two) times daily as needed for dizziness. 02/19/24  Yes Katrinka Garnette KIDD, MD  nitroGLYCERIN  (NITROSTAT ) 0.4 MG SL tablet DISSOLVE 1 TABLET UNDER THE TONGUE EVERY 5 MINUTES AS NEEDED FOR CHEST PAIN 04/21/24  Yes Henry Shaver B, NP  spironolactone  (ALDACTONE ) 25 MG tablet TAKE 1 TABLET BY MOUTH AS DIRECTED. TAKE 1 TABLET ON MONDAYS, WEDNESDAYS, AND FRIDAYS AT 6 PM. 02/18/24  Yes Anner Alm ORN, MD  nystatin  (MYCOSTATIN /NYSTOP ) powder Apply 1 Application topically 3 (three) times daily. 06/16/23   Timmy Maude SAUNDERS, MD  sulfamethoxazole-trimethoprim (BACTRIM) 400-80 MG tablet Take 1  tablet by mouth 2 (two) times daily. Patient not taking: Reported on 06/17/2024 06/10/24   Katrinka Garnette KIDD, MD    Physical Exam: Vitals:   06/17/24 2115 06/17/24 2130 06/17/24 2200 06/17/24 2300  BP: (!) 115/36 (!) 117/50 (!) 139/54 (!) 127/52  Pulse: 72 74 70 66  Resp: 15 15 17  (!) 21  Temp: 97.6 F (36.4 C)     TempSrc: Oral     SpO2: 97% 94% 96% 95%   *** Data Reviewed: {Tip this will not be part of the note when signed- Document your independent interpretation of telemetry tracing, EKG, lab, Radiology test or any other diagnostic tests. Add any new diagnostic test ordered today. (Optional):[id number]} Results for orders placed or performed during the hospital encounter of 06/17/24 (from the past 24 hours)  CBC with Differential     Status: Abnormal   Collection Time: 06/17/24  7:26 PM  Result Value Ref Range   WBC 9.0 4.0 - 10.5 K/uL   RBC 3.47 (L) 3.87 - 5.11 MIL/uL   Hemoglobin 10.5 (L) 12.0 - 15.0 g/dL   HCT 69.4 (L) 63.9 - 53.9 %   MCV 87.9 80.0 - 100.0 fL   MCH 30.3 26.0 - 34.0 pg   MCHC 34.4 30.0 - 36.0 g/dL   RDW 85.6 88.4 - 84.4 %   Platelets 301 150 - 400 K/uL   nRBC 0.0 0.0 - 0.2 %   Neutrophils Relative % 71 %   Neutro Abs 6.4 1.7 - 7.7 K/uL   Lymphocytes Relative 14 %   Lymphs Abs 1.3 0.7 - 4.0 K/uL   Monocytes Relative 12 %   Monocytes Absolute 1.1 (H) 0.1 - 1.0 K/uL   Eosinophils Relative 3 %   Eosinophils Absolute 0.3 0.0 - 0.5 K/uL   Basophils Relative 0 %   Basophils Absolute 0.0 0.0 - 0.1 K/uL   Immature Granulocytes 0 %   Abs Immature Granulocytes 0.03 0.00 - 0.07 K/uL  Comprehensive metabolic panel     Status: Abnormal   Collection Time: 06/17/24  7:26 PM  Result Value Ref Range   Sodium 117 (LL) 135 - 145 mmol/L   Potassium 2.5 (LL) 3.5 - 5.1 mmol/L   Chloride 77 (L) 98 - 111 mmol/L   CO2 25 22 - 32 mmol/L   Glucose, Bld 125 (H) 70 - 99 mg/dL   BUN 90 (H) 8 - 23 mg/dL   Creatinine, Ser 6.83 (H) 0.44 - 1.00 mg/dL   Calcium 9.5 8.9 - 89.6  mg/dL   Total Protein 6.8 6.5 - 8.1 g/dL   Albumin 4.2 3.5 - 5.0 g/dL   AST 20 15 - 41 U/L   ALT 16 0 - 44 U/L   Alkaline Phosphatase  70 38 - 126 U/L   Total Bilirubin 0.5 0.0 - 1.2 mg/dL   GFR, Estimated 14 (L) >60 mL/min   Anion gap 15 5 - 15  Magnesium      Status: Abnormal   Collection Time: 06/17/24  8:12 PM  Result Value Ref Range   Magnesium  2.9 (H) 1.7 - 2.4 mg/dL  Sodium, urine, random     Status: None   Collection Time: 06/17/24 10:49 PM  Result Value Ref Range   Sodium, Ur <30 mmol/L  Creatinine, urine, random     Status: None   Collection Time: 06/17/24 10:49 PM  Result Value Ref Range   Creatinine, Urine 56 mg/dL  Urinalysis, Routine w reflex microscopic -Urine, Clean Catch     Status: Abnormal   Collection Time: 06/17/24 10:49 PM  Result Value Ref Range   Color, Urine STRAW (A) YELLOW   APPearance CLEAR CLEAR   Specific Gravity, Urine 1.008 1.005 - 1.030   pH 6.0 5.0 - 8.0   Glucose, UA NEGATIVE NEGATIVE mg/dL   Hgb urine dipstick NEGATIVE NEGATIVE   Bilirubin Urine NEGATIVE NEGATIVE   Ketones, ur NEGATIVE NEGATIVE mg/dL   Protein, ur NEGATIVE NEGATIVE mg/dL   Nitrite NEGATIVE NEGATIVE   Leukocytes,Ua NEGATIVE NEGATIVE     Assessment and Plan: 1. Colitis w 2 weeks of diarrhea 2. Severe hyponatremia likely due to hypovolemia 3. Hypokalemia  - IV fluids - Replete electrolytes -   4. Acute on chronic kidney disease Creatinine is 3.6.  Monitor  5. H/o CHF with preserbevd EF-   6. CAD s/p RCA stent   7. Atrial fibrillation - Continue Eliquis  and carvedilol    Advance Care Planning:   Code Status: Full Code the patient names her husband is her surrogate decision maker and she wants to be full code.  Consults: None  Family Communication: None  Severity of Illness: The appropriate patient status for this patient is INPATIENT. Inpatient status is judged to be reasonable and necessary in order to provide the required intensity of service to ensure the  patient's safety. The patient's presenting symptoms, physical exam findings, and initial radiographic and laboratory data in the context of their chronic comorbidities is felt to place them at high risk for further clinical deterioration. Furthermore, it is not anticipated that the patient will be medically stable for discharge from the hospital within 2 midnights of admission.   * I certify that at the point of admission it is my clinical judgment that the patient will require inpatient hospital care spanning beyond 2 midnights from the point of admission due to high intensity of service, high risk for further deterioration and high frequency of surveillance required.*  Author: ARTHEA CHILD, MD 06/17/2024 11:53 PM  For on call review [email or web address].

## 2024-06-18 ENCOUNTER — Encounter (HOSPITAL_COMMUNITY): Payer: Self-pay | Admitting: Internal Medicine

## 2024-06-18 ENCOUNTER — Encounter: Payer: Self-pay | Admitting: Cardiology

## 2024-06-18 ENCOUNTER — Encounter: Payer: Self-pay | Admitting: Family Medicine

## 2024-06-18 DIAGNOSIS — E871 Hypo-osmolality and hyponatremia: Secondary | ICD-10-CM | POA: Diagnosis not present

## 2024-06-18 LAB — BASIC METABOLIC PANEL WITH GFR
Anion gap: 12 (ref 5–15)
Anion gap: 13 (ref 5–15)
Anion gap: 14 (ref 5–15)
Anion gap: 13 (ref 5–15)
BUN: 77 mg/dL — ABNORMAL HIGH (ref 8–23)
BUN: 83 mg/dL — ABNORMAL HIGH (ref 8–23)
BUN: 80 mg/dL — ABNORMAL HIGH (ref 8–23)
BUN: 76 mg/dL — ABNORMAL HIGH (ref 8–23)
CO2: 23 mmol/L (ref 22–32)
CO2: 22 mmol/L (ref 22–32)
CO2: 22 mmol/L (ref 22–32)
CO2: 22 mmol/L (ref 22–32)
Calcium: 8.6 mg/dL — ABNORMAL LOW (ref 8.9–10.3)
Calcium: 8.4 mg/dL — ABNORMAL LOW (ref 8.9–10.3)
Calcium: 8.7 mg/dL — ABNORMAL LOW (ref 8.9–10.3)
Calcium: 9 mg/dL (ref 8.9–10.3)
Chloride: 84 mmol/L — ABNORMAL LOW (ref 98–111)
Chloride: 84 mmol/L — ABNORMAL LOW (ref 98–111)
Chloride: 84 mmol/L — ABNORMAL LOW (ref 98–111)
Chloride: 87 mmol/L — ABNORMAL LOW (ref 98–111)
Creatinine, Ser: 3.03 mg/dL — ABNORMAL HIGH (ref 0.44–1.00)
Creatinine, Ser: 2.68 mg/dL — ABNORMAL HIGH (ref 0.44–1.00)
Creatinine, Ser: 2.52 mg/dL — ABNORMAL HIGH (ref 0.44–1.00)
Creatinine, Ser: 2.43 mg/dL — ABNORMAL HIGH (ref 0.44–1.00)
GFR, Estimated: 15 mL/min — ABNORMAL LOW (ref >60)
GFR, Estimated: 17 mL/min — ABNORMAL LOW (ref >60)
GFR, Estimated: 18 mL/min — ABNORMAL LOW (ref >60)
GFR, Estimated: 19 mL/min — ABNORMAL LOW (ref >60)
Glucose, Bld: 110 mg/dL — ABNORMAL HIGH (ref 70–99)
Glucose, Bld: 119 mg/dL — ABNORMAL HIGH (ref 70–99)
Glucose, Bld: 114 mg/dL — ABNORMAL HIGH (ref 70–99)
Glucose, Bld: 152 mg/dL — ABNORMAL HIGH (ref 70–99)
Potassium: 2.9 mmol/L — ABNORMAL LOW (ref 3.5–5.1)
Potassium: 2.5 mmol/L — CL (ref 3.5–5.1)
Potassium: 2.8 mmol/L — ABNORMAL LOW (ref 3.5–5.1)
Potassium: 3.5 mmol/L (ref 3.5–5.1)
Sodium: 119 mmol/L — CL (ref 135–145)
Sodium: 120 mmol/L — ABNORMAL LOW (ref 135–145)
Sodium: 120 mmol/L — ABNORMAL LOW (ref 135–145)
Sodium: 121 mmol/L — ABNORMAL LOW (ref 135–145)

## 2024-06-18 LAB — IRON AND TIBC
Iron: 48 ug/dL (ref 28–170)
Saturation Ratios: 16 % (ref 10.4–31.8)
TIBC: 301 ug/dL (ref 250–450)
UIBC: 253 ug/dL

## 2024-06-18 LAB — OSMOLALITY, URINE: Osmolality, Ur: 234 mosm/kg — ABNORMAL LOW (ref 300–900)

## 2024-06-18 LAB — CBC
HCT: 25.5 % — ABNORMAL LOW (ref 36.0–46.0)
Hemoglobin: 8.8 g/dL — ABNORMAL LOW (ref 12.0–15.0)
MCH: 31 pg (ref 26.0–34.0)
MCHC: 34.5 g/dL (ref 30.0–36.0)
MCV: 89.8 fL (ref 80.0–100.0)
Platelets: 237 K/uL (ref 150–400)
RBC: 2.84 MIL/uL — ABNORMAL LOW (ref 3.87–5.11)
RDW: 14.2 % (ref 11.5–15.5)
WBC: 6.6 K/uL (ref 4.0–10.5)
nRBC: 0 % (ref 0.0–0.2)

## 2024-06-18 LAB — OSMOLALITY: Osmolality: 279 mosm/kg (ref 275–295)

## 2024-06-18 LAB — MAGNESIUM: Magnesium: 2.6 mg/dL — ABNORMAL HIGH (ref 1.7–2.4)

## 2024-06-18 LAB — TSH: TSH: 2.09 u[IU]/mL (ref 0.350–4.500)

## 2024-06-18 MED ORDER — LOPERAMIDE HCL 1 MG/7.5ML PO SUSP
4.0000 mg | Freq: Once | ORAL | Status: AC
  Administered 2024-06-18: 4 mg via ORAL
  Filled 2024-06-18: qty 30

## 2024-06-18 MED ORDER — DICYCLOMINE HCL 10 MG PO CAPS
10.0000 mg | ORAL_CAPSULE | Freq: Three times a day (TID) | ORAL | Status: AC
  Administered 2024-06-18 – 2024-06-19 (×4): 10 mg via ORAL
  Filled 2024-06-18 (×4): qty 1

## 2024-06-18 MED ORDER — POTASSIUM CHLORIDE 20 MEQ PO PACK
40.0000 meq | PACK | ORAL | Status: AC
  Administered 2024-06-18 (×3): 40 meq via ORAL
  Filled 2024-06-18 (×3): qty 2

## 2024-06-18 MED ORDER — FLORANEX PO PACK
1.0000 g | PACK | Freq: Three times a day (TID) | ORAL | Status: DC
  Administered 2024-06-18 – 2024-06-22 (×12): 1 g via ORAL
  Filled 2024-06-18 (×17): qty 1

## 2024-06-18 MED ORDER — HYDROCODONE-ACETAMINOPHEN 5-325 MG PO TABS
1.0000 | ORAL_TABLET | Freq: Four times a day (QID) | ORAL | Status: DC | PRN
  Administered 2024-06-18 – 2024-06-20 (×6): 1 via ORAL
  Filled 2024-06-18 (×7): qty 1

## 2024-06-18 MED ORDER — ORAL CARE MOUTH RINSE: 15.0000 mL | OROMUCOSAL | Status: DC | PRN

## 2024-06-18 MED ORDER — APIXABAN 2.5 MG PO TABS
2.5000 mg | ORAL_TABLET | Freq: Two times a day (BID) | ORAL | Status: DC
  Administered 2024-06-18 – 2024-06-22 (×8): 2.5 mg via ORAL
  Filled 2024-06-18 (×8): qty 1

## 2024-06-18 MED ORDER — PIPERACILLIN-TAZOBACTAM 3.375 G IVPB 30 MIN: 3.3750 g | Freq: Four times a day (QID) | INTRAVENOUS | Status: DC

## 2024-06-18 MED ORDER — POTASSIUM CHLORIDE 10 MEQ/100ML IV SOLN
10.0000 meq | INTRAVENOUS | Status: AC
  Administered 2024-06-18 (×3): 10 meq via INTRAVENOUS
  Filled 2024-06-18 (×3): qty 100

## 2024-06-18 MED ORDER — PIPERACILLIN-TAZOBACTAM 3.375 G IVPB
3.3750 g | Freq: Three times a day (TID) | INTRAVENOUS | Status: DC
  Administered 2024-06-18 – 2024-06-21 (×8): 3.375 g via INTRAVENOUS
  Filled 2024-06-18 (×8): qty 50

## 2024-06-18 MED ORDER — PIPERACILLIN-TAZOBACTAM IN DEX 2-0.25 GM/50ML IV SOLN
2.2500 g | Freq: Four times a day (QID) | INTRAVENOUS | Status: DC
  Administered 2024-06-18 (×3): 2.25 g via INTRAVENOUS
  Filled 2024-06-18 (×4): qty 50

## 2024-06-18 NOTE — ED Notes (Signed)
 Unable to finish administering 3rd dose of potassium because of pt having to be transported back and forth to bathroom

## 2024-06-18 NOTE — Consult Note (Signed)
 [name] is well-known to me.  She is a very trying 83 year old white female.  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  She underwent radiation therapy for this.  She had this resected at [facility].  The radiation was completed a year ago.  We last saw her back in August.  We did do a PET scan on her back in July.  Everything looked okay on the PET scan.  She had an appointment with us  yesterday.  She has been having diarrhea.  She been having vomiting.  We got labs on her which showed marked hyponatremia and hypokalemia.  Her sodium was 117.  Potassium 2.6.  Chloride was only 77.  BUN 19 creatinine 3.16.  Calcium 9.5.  She went to the ER.  She had a CT of the abdomen pelvis.  This was relatively unrevealing.  She may have had some diverticulitis.  There is wall thickening of the majority of the colon.  She has some diverticula.  There is no obvious acute inflammation.  The pancreas looked fine.  There is no adenopathy.  Gallbladder was absent.  The liver looked fine.  She had a urinalysis that was unremarkable.  She is receiving IV fluids.  This morning, her sodium is only 119.  Potassium 3.9.  BUN 77 creatinine 3.03.  Her calcium is 8.6.  Albumin is 4.2.  A CBC that was done today showed a white cell count 6.6.  Hemoglobin 8.8.  Platelet count 237,000.  MCV is 90.  As always, she wants to go home.  I just do not think that she is able to go home with these numbers with her electrolytes and also to some degree with her anemia.  She says that she has had some bleeding.  She has not a Maven, however urine.  Her appetite is okay.  She has had no fever.  She has had no rashes.  There is been no change in her medications.   Her vital signs show temperature 97.4.  Pulse 73.  Blood pressure 140/43.  Her head and neck exam shows no ocular or oral lesions.  There is no scleral icterus.  She has no intraoral lesions.  Oral mucosa is relatively moist.  Neck is supple  with no adenopathy.  Lungs are clear bilaterally.  She has good air movement bilaterally.  Cardiac exam regular rate and rhythm.  She has no murmurs.  Abdomen is soft.  Bowel sounds are present.  There is no guarding or rebound tenderness.  She has no fluid wave.  There is no palpable liver or spleen tip.  Extremities shows no clubbing, cyanosis or edema.  She has surgical scars on her knees.  She has a little bit of tenderness behind the left knee which is chronic from her past surgery and radiation.  She has good strength in her arms and legs.  Neurological exam shows no focal neurological deficits.   [name] is a very nice 83 year old white female.  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  This was treated with surgery and radiation.  She has had no treatment now for over a year.  Again I am not sure as to why she has the profound hyponatremia and hypokalemia.  It sounds like she may have diverticulitis.  She is quite anemic.  This will have to be watched.  She is certainly not able to go home from my point of view with  her electrolytes as they are.  I know that she can be very independent..  I know that she will want to go home.  Thankfully, her husband who is a good guy and a patient of ours, will be able to help her.  She clearly needs IV fluids.  She was found was incredibly dehydrated by such a low chloride.  This is coming up slowly.  The BUN is also improving as is the creatinine.  This can take several days to normalize.  I know that she will get great care in the ER.  I am not sure which room she will go to.  We will find her.    Tricia Crease, MD  [place] 1:5

## 2024-06-18 NOTE — ED Notes (Signed)
 Pt dizzy upon standing. Wheelchair use to go to bathroom

## 2024-06-18 NOTE — ED Notes (Signed)
 Episode of runny diarrhea. Pt currently on bedside commode

## 2024-06-18 NOTE — ED Notes (Signed)
 Episode of runny diarrhea. Pt cleaned and then preceded to poop again. Unable to insert rectal tube due to pt unable to stop pooping. Pt cleaned then placed on  bedside commode again

## 2024-06-18 NOTE — ED Notes (Signed)
 Ot had emesis episode. States her stomach hurts. Ondansetron  administered

## 2024-06-18 NOTE — Progress Notes (Signed)
 PROGRESS NOTE    [name]  FMW:[id number] DOB: February 23, 1941 DOA: 06/17/2024 PCP: Katrinka Garnette KIDD, MD   Brief Narrative:  [name] is a 83 y.o. female with medical history significant for atrial fib on Eliquis  and Amiodarone  and pacemaker who presents with 2 weeks of diarrhea and abdominal pain with poor p.o. intake fatigue and weakness.   Assessment & Plan:   Principal Problem:   Hyponatremia   Intractable abdominal pain and diarrhea Colitis, nonspecific, POA Rule out intra-abdominal infection -GI panel, C. difficile testing pending although of low value at this point -Patient recently completed antibiotics earlier this week but again diarrhea has been ongoing for 2 weeks with no obvious onset or sick contacts - Contact precautions until testing is negative -CT abdomen shows nonspecific sigmoid wall thickening without inflammation - Hold antibiotics in the interim given no fever no leukocytosis and no clear signs or symptoms of infection other than diarrhea which appears to be resolving with supportive care  Severe hypovolemic hyponatremia, resolving  - Continue IV fluids, supportive care - Sodium trending upward slowly  Hypokalemia - Secondary to diarrhea, improving with supplementation  Acute on chronic kidney disease, improving -Baseline creatinine 1.7, admitted at 3.6 but downtrending appropriately  -Continue IV fluids, advance diet as tolerated  Heart failure, preserved ejection fraction, without acute exacerbation -Continue IV fluids as above, monitor closely given heart failure diagnosis    CAD s/p RCA stent earlier this year -no acute findings - plavix  stop date in feb 2026   Atrial fibrillation - Continue Eliquis (currently at half dose given AKI and advanced age)  and carvedilol    DVT prophylaxis: apixaban  (ELIQUIS ) tablet 2.5 mg Start: 06/18/24 2200 apixaban  (ELIQUIS ) tablet 2.5 mg  Code Status:   Code Status: Full Code Family Communication:  None present  Status is: Inpatient  Dispo: The patient is from: Home              Anticipated d/c is to: Home              Anticipated d/c date is: 24 to 48 hours              Patient currently not medically stable for discharge  Consultants:  None  Procedures:  None  Antimicrobials:  None indicated  Subjective: No acute issues or events overnight, diarrhea improving as is her labs.  She continues to have diarrhea however but notes this is a less frequent event with much smaller volume than previous  Objective: Vitals:   06/18/24 1026 06/18/24 1300 06/18/24 1606 06/18/24 1624  BP: (!) 138/50 (!) 146/49 (!) 129/40   Pulse: 62 68 69   Resp: (!) 22 19 18    Temp: 98.2 F (36.8 C)  98.4 F (36.9 C)   TempSrc: Oral  Oral   SpO2: 94% 95% 99%   Weight:    89.1 kg  Height:    5' 8 (1.727 m)    Intake/Output Summary (Last 24 hours) at 06/18/2024 1700 Last data filed at 06/18/2024 1314 Gross per 24 hour  Intake 1078.73 ml  Output --  Net 1078.73 ml   Filed Weights   06/18/24 1624  Weight: 89.1 kg    Examination:  General:  Pleasantly resting in bed, No acute distress. HEENT:  Normocephalic atraumatic.  Sclerae nonicteric, noninjected.  Extraocular movements intact bilaterally. Neck:  Without mass or deformity.  Trachea is midline. Lungs:  Clear to auscultate bilaterally without rhonchi, wheeze, or rales. Heart:  Regular rate and rhythm.  Without murmurs, rubs, or gallops. Abdomen:  Soft, nontender, nondistended.  Without guarding or rebound. Extremities: Without cyanosis, clubbing, edema, or obvious deformity. Skin:  Warm and dry, no erythema.  Data Reviewed: I have personally reviewed following labs and imaging studies  CBC: Recent Labs  Lab 06/17/24 1044 06/17/24 1926 06/18/24 0538  WBC 10.2 9.0 6.6  NEUTROABS 7.2 6.4  --   HGB 10.5* 10.5* 8.8*  HCT 28.8* 30.5* 25.5*  MCV 87.3 87.9 89.8  PLT 310 301 237   Basic Metabolic Panel: Recent Labs  Lab  06/17/24 1926 06/17/24 2012 06/18/24 0017 06/18/24 0538 06/18/24 0900 06/18/24 1539  NA 117*  --  119* 120* 120* 121*  K 2.5*  --  2.9* 2.5* 2.8* 3.5  CL 77*  --  84* 84* 84* 87*  CO2 25  --  23 22 22 22   GLUCOSE 125*  --  110* 119* 114* 152*  BUN 90*  --  77* 83* 80* 76*  CREATININE 3.16*  --  3.03* 2.68* 2.52* 2.43*  CALCIUM 9.5  --  8.6* 8.4* 8.7* 9.0  MG  --  2.9*  --  2.6*  --   --    GFR: Estimated Creatinine Clearance: 20.9 mL/min (A) (by C-G formula based on SCr of 2.43 mg/dL (H)). Liver Function Tests: Recent Labs  Lab 06/17/24 1044 06/17/24 1926  AST 18 20  ALT 15 16  ALKPHOS 65 70  BILITOT 0.5 0.5  PROT 6.8 6.8  ALBUMIN 4.2 4.2   BNP (last 3 results) Recent Labs    05/24/24 1643  PROBNP 819*   Thyroid  Function Tests: Recent Labs    06/18/24 0538  TSH 2.090   Anemia Panel: Recent Labs    06/18/24 0800  TIBC 301  IRON 48    No results found for this or any previous visit (from the past 240 hours).   Radiology Studies: CT Renal Stone Study Result Date: 06/17/2024 EXAM: CT UROGRAM 06/17/2024 10:29:59 PM TECHNIQUE: CT of the abdomen and pelvis was performed without intravenous contrast as per CT urogram protocol. Multiplanar reformatted images as well as MIP urogram images are provided for review. Automated exposure control, iterative reconstruction, and/or weight based adjustment of the mA/kV was utilized to reduce the radiation dose to as low as reasonably achievable. COMPARISON: CT chest abdomen and pelvis 10/30/2023. CLINICAL HISTORY: Abdominal/flank pain, stone suspected. Per triage note: Pt arrives to triage via wheelchair accompanied by husband. PT was called and told to come to the ER because her sodium and potassium are low. Pt endorses feeling weak. FINDINGS: LOWER CHEST: No acute abnormality. LIVER: Left-sided pneumobilia is again seen. GALLBLADDER AND BILE DUCTS: Gallbladder is surgically absent. Common bile duct is dilated measuring 12 mm  similar to prior. SPLEEN: No acute abnormality. PANCREAS: No acute abnormality. ADRENAL GLANDS: 13 mm left adrenal adenoma is unchanged. KIDNEYS, URETERS AND BLADDER: There is a single 4 mm calculus in the right kidney. No stones in the ureters. No hydronephrosis. No perinephric or periureteral stranding. There is a small midline lower abdominal wall hernia containing a portion of the bladder. GI AND BOWEL: Stomach demonstrates no acute abnormality. There is wall thickening of the majority of the sigmoid colon which has increased and extent when compared to the prior study. Diverticula are present. No acute inflammation. Appendix is not visualized. There is a small hiatal hernia. There is no bowel obstruction. PERITONEUM AND RETROPERITONEUM: No ascites. No free air. VASCULATURE: Aorta is normal in caliber. There are atherosclerotic  calcifications of the aorta, left renal artery, and celiac artery. Left renal artery and celiac artery stents are present. LYMPH NODES: Prominent left inguinal lymph nodes measuring up to 11 mm. REPRODUCTIVE ORGANS: Uterus is surgically absent. BONES AND SOFT TISSUES: Extensive thoracolumbar fusion hardware present. Sacroiliac screws were also present. No acute osseous abnormality. No focal soft tissue abnormality. IMPRESSION: 1. Wall thickening of the majority of the sigmoid colon with diverticula, increased in extent compared to prior study. No acute inflammation. Findings may be related to chronic sequelae of colitis or diverticulitis. An underlying neoplastic process is not excluded. 2. Single 4 mm calculus in the right kidney. Electronically signed by: Greig Pique MD 06/17/2024 10:36 PM EDT RP Workstation: [id number]        Scheduled Meds:  allopurinol   100 mg Oral Daily   [START ON 06/19/2024] amiodarone   200 mg Oral Daily   apixaban   2.5 mg Oral BID   carvedilol   6.25 mg Oral BID   clopidogrel   75 mg Oral Q breakfast   dicyclomine  10 mg Oral TID AC   [START ON  06/19/2024] isosorbide  mononitrate  60 mg Oral Daily   lactobacillus  1 g Oral TID WC   sodium chloride  flush  3 mL Intravenous Q12H   Continuous Infusions:  lactated ringers 100 mL/hr at 06/18/24 1350   piperacillin-tazobactam (ZOSYN)  IV       LOS: 1 day   Time spent:  Tricia JAYSON Montclair, DO [facility]  If 7PM-7AM, please contact night-coverage [email or web address]  06/18/2024, 5:00 PM

## 2024-06-18 NOTE — CV Procedure (Signed)
  Device system confirmed to be MRI conditional, with implant date > 6 weeks ago, and no evidence of abandoned or epicardial leads in review of most recent CXR  Device last cleared by EP Provider: Charlies Arthur 06/18/24  Clearance is good through for 1 year as long as parameters remain stable at time of check. If pt undergoes a cardiac device procedure during that time, they should be re-cleared.   Tachy-therapies to be programmed off if applicable with device back to pre-MRI settings after completion of exam.  [employer] - Programming recommendation received through [employer] App/Tablet  Rocky Catalan, RT  06/18/2024 3:42 PM

## 2024-06-18 NOTE — ED Notes (Signed)
 Restarted K and LR

## 2024-06-19 ENCOUNTER — Encounter (HOSPITAL_COMMUNITY): Payer: Self-pay | Admitting: Internal Medicine

## 2024-06-19 DIAGNOSIS — E871 Hypo-osmolality and hyponatremia: Secondary | ICD-10-CM | POA: Diagnosis not present

## 2024-06-19 LAB — CBC WITH DIFFERENTIAL/PLATELET
Abs Immature Granulocytes: 0.03 K/uL (ref 0.00–0.07)
Basophils Absolute: 0 K/uL (ref 0.0–0.1)
Basophils Relative: 0 %
Eosinophils Absolute: 0.2 K/uL (ref 0.0–0.5)
Eosinophils Relative: 2 %
HCT: 27.5 % — ABNORMAL LOW (ref 36.0–46.0)
Hemoglobin: 9 g/dL — ABNORMAL LOW (ref 12.0–15.0)
Immature Granulocytes: 0 %
Lymphocytes Relative: 11 %
Lymphs Abs: 1.1 K/uL (ref 0.7–4.0)
MCH: 30.5 pg (ref 26.0–34.0)
MCHC: 32.7 g/dL (ref 30.0–36.0)
MCV: 93.2 fL (ref 80.0–100.0)
Monocytes Absolute: 1.3 K/uL — ABNORMAL HIGH (ref 0.1–1.0)
Monocytes Relative: 12 %
Neutro Abs: 7.6 K/uL (ref 1.7–7.7)
Neutrophils Relative %: 75 %
Platelets: 250 K/uL (ref 150–400)
RBC: 2.95 MIL/uL — ABNORMAL LOW (ref 3.87–5.11)
RDW: 14.6 % (ref 11.5–15.5)
WBC: 10.2 K/uL (ref 4.0–10.5)
nRBC: 0 % (ref 0.0–0.2)

## 2024-06-19 LAB — COMPREHENSIVE METABOLIC PANEL WITH GFR
ALT: 13 U/L (ref 0–44)
AST: 15 U/L (ref 15–41)
Albumin: 3.5 g/dL (ref 3.5–5.0)
Alkaline Phosphatase: 59 U/L (ref 38–126)
Anion gap: 12 (ref 5–15)
BUN: 74 mg/dL — ABNORMAL HIGH (ref 8–23)
CO2: 23 mmol/L (ref 22–32)
Calcium: 8.9 mg/dL (ref 8.9–10.3)
Chloride: 90 mmol/L — ABNORMAL LOW (ref 98–111)
Creatinine, Ser: 2.27 mg/dL — ABNORMAL HIGH (ref 0.44–1.00)
GFR, Estimated: 21 mL/min — ABNORMAL LOW (ref >60)
Glucose, Bld: 123 mg/dL — ABNORMAL HIGH (ref 70–99)
Potassium: 3.8 mmol/L (ref 3.5–5.1)
Sodium: 124 mmol/L — ABNORMAL LOW (ref 135–145)
Total Bilirubin: 0.5 mg/dL (ref 0.0–1.2)
Total Protein: 5.6 g/dL — ABNORMAL LOW (ref 6.5–8.1)

## 2024-06-19 LAB — GLUCOSE, CAPILLARY: Glucose-Capillary: 146 mg/dL — ABNORMAL HIGH (ref 70–99)

## 2024-06-19 LAB — MAGNESIUM: Magnesium: 2.6 mg/dL — ABNORMAL HIGH (ref 1.7–2.4)

## 2024-06-19 MED ORDER — SODIUM CHLORIDE 0.9 % IV SOLN
250.0000 mg | Freq: Every day | INTRAVENOUS | Status: AC
  Administered 2024-06-19 – 2024-06-21 (×3): 250 mg via INTRAVENOUS
  Filled 2024-06-19 (×3): qty 20

## 2024-06-19 NOTE — Progress Notes (Signed)
 PROGRESS NOTE    [name]  FMW:[id number] DOB: May 27, 1941 DOA: 06/17/2024 PCP: Katrinka Garnette KIDD, MD   Brief Narrative:  [name] is a 83 y.o. female with medical history significant for atrial fib on Eliquis  and Amiodarone  and pacemaker who presents with 2 weeks of diarrhea and abdominal pain with poor p.o. intake fatigue and weakness.  Assessment & Plan:   Principal Problem:   Hyponatremia   Intractable abdominal pain and diarrhea Colitis, nonspecific, POA Rule out intra-abdominal infection -GI panel, C. difficile testing pending although of low value at this point -Patient recently completed antibiotics earlier this week but again diarrhea has been ongoing for 2 weeks with no obvious onset or sick contacts - Contact precautions until testing is negative - CT abdomen shows nonspecific sigmoid wall thickening without inflammation - Hold antibiotics in the interim given no fever no leukocytosis and no clear signs or symptoms of infection other than diarrhea which appears to be resolving with supportive care  Severe hypovolemic hyponatremia, resolving  - Continue IV fluids, supportive care - Sodium trending upwards appropriately  Hypokalemia - Secondary to diarrhea, currently within normal limits  Acute on chronic kidney disease, improving -Baseline creatinine 1.7, admitted at 3.6 but downtrending appropriately  -Continue IV fluids, advance diet as tolerated  Heart failure, preserved ejection fraction, without acute exacerbation -Continue IV fluids as above, monitor closely given heart failure diagnosis    CAD s/p RCA stent earlier this year -no acute findings - plavix  stop date in feb 2026   Atrial fibrillation - Continue Eliquis (currently at half dose given AKI and advanced age)  and carvedilol    DVT prophylaxis: apixaban  (ELIQUIS ) tablet 2.5 mg Start: 06/18/24 2200 apixaban  (ELIQUIS ) tablet 2.5 mg  Code Status:   Code Status: Full Code Family  Communication: None present  Status is: Inpatient  Dispo: The patient is from: Home              Anticipated d/c is to: Home              Anticipated d/c date is: 24 to 48 hours              Patient currently not medically stable for discharge  Consultants:  None  Procedures:  None  Antimicrobials:  None indicated  Subjective: No acute issues or events overnight, diarrhea appears to resolved in the interim, last bowel movement noted was last night.  Patient otherwise denies nausea vomiting fevers chills or shortness of breath  Objective: Vitals:   06/18/24 1624 06/18/24 1929 06/19/24 0011 06/19/24 0408  BP:  (!) 109/31 (!) 94/32 (!) 100/42  Pulse:  73 73 75  Resp:  13 16 16   Temp:  98.7 F (37.1 C) 98.7 F (37.1 C) 99.4 F (37.4 C)  TempSrc:  Oral Oral Oral  SpO2:  100% 96% 96%  Weight: 89.1 kg     Height: 5' 8 (1.727 m)       Intake/Output Summary (Last 24 hours) at 06/19/2024 0758 Last data filed at 06/19/2024 0417 Gross per 24 hour  Intake 2021.04 ml  Output --  Net 2021.04 ml   Filed Weights   06/18/24 1624  Weight: 89.1 kg    Examination:  General:  Pleasantly resting in bed, No acute distress. HEENT:  Normocephalic atraumatic.  Sclerae nonicteric, noninjected.  Extraocular movements intact bilaterally. Neck:  Without mass or deformity.  Trachea is midline. Lungs:  Clear to auscultate bilaterally without rhonchi, wheeze, or rales. Heart:  Regular rate  and rhythm.  Without murmurs, rubs, or gallops. Abdomen:  Soft, nontender, nondistended.  Without guarding or rebound. Extremities: Without cyanosis, clubbing, edema, or obvious deformity. Skin:  Warm and dry, no erythema.  Data Reviewed: I have personally reviewed following labs and imaging studies  CBC: Recent Labs  Lab 06/17/24 1044 06/17/24 1926 06/18/24 0538 06/19/24 0408  WBC 10.2 9.0 6.6 10.2  NEUTROABS 7.2 6.4  --  7.6  HGB 10.5* 10.5* 8.8* 9.0*  HCT 28.8* 30.5* 25.5* 27.5*  MCV 87.3  87.9 89.8 93.2  PLT 310 301 237 250   Basic Metabolic Panel: Recent Labs  Lab 06/17/24 1926 06/17/24 2012 06/18/24 0017 06/18/24 0538 06/18/24 0900 06/18/24 1539 06/19/24 0408  NA 117*  --  119* 120* 120* 121*  --   K 2.5*  --  2.9* 2.5* 2.8* 3.5  --   CL 77*  --  84* 84* 84* 87*  --   CO2 25  --  23 22 22 22   --   GLUCOSE 125*  --  110* 119* 114* 152*  --   BUN 90*  --  77* 83* 80* 76*  --   CREATININE 3.16*  --  3.03* 2.68* 2.52* 2.43*  --   CALCIUM 9.5  --  8.6* 8.4* 8.7* 9.0  --   MG  --  2.9*  --  2.6*  --   --  2.6*   GFR: Estimated Creatinine Clearance: 20.9 mL/min (A) (by C-G formula based on SCr of 2.43 mg/dL (H)). Liver Function Tests: Recent Labs  Lab 06/17/24 1044 06/17/24 1926  AST 18 20  ALT 15 16  ALKPHOS 65 70  BILITOT 0.5 0.5  PROT 6.8 6.8  ALBUMIN 4.2 4.2   BNP (last 3 results) Recent Labs    05/24/24 1643  PROBNP 819*   Thyroid  Function Tests: Recent Labs    06/18/24 0538  TSH 2.090   Anemia Panel: Recent Labs    06/18/24 0800  TIBC 301  IRON 48    No results found for this or any previous visit (from the past 240 hours).   Radiology Studies: CT Renal Stone Study Result Date: 06/17/2024 EXAM: CT UROGRAM 06/17/2024 10:29:59 PM TECHNIQUE: CT of the abdomen and pelvis was performed without intravenous contrast as per CT urogram protocol. Multiplanar reformatted images as well as MIP urogram images are provided for review. Automated exposure control, iterative reconstruction, and/or weight based adjustment of the mA/kV was utilized to reduce the radiation dose to as low as reasonably achievable. COMPARISON: CT chest abdomen and pelvis 10/30/2023. CLINICAL HISTORY: Abdominal/flank pain, stone suspected. Per triage note: Pt arrives to triage via wheelchair accompanied by husband. PT was called and told to come to the ER because her sodium and potassium are low. Pt endorses feeling weak. FINDINGS: LOWER CHEST: No acute abnormality. LIVER:  Left-sided pneumobilia is again seen. GALLBLADDER AND BILE DUCTS: Gallbladder is surgically absent. Common bile duct is dilated measuring 12 mm similar to prior. SPLEEN: No acute abnormality. PANCREAS: No acute abnormality. ADRENAL GLANDS: 13 mm left adrenal adenoma is unchanged. KIDNEYS, URETERS AND BLADDER: There is a single 4 mm calculus in the right kidney. No stones in the ureters. No hydronephrosis. No perinephric or periureteral stranding. There is a small midline lower abdominal wall hernia containing a portion of the bladder. GI AND BOWEL: Stomach demonstrates no acute abnormality. There is wall thickening of the majority of the sigmoid colon which has increased and extent when compared to the prior study.  Diverticula are present. No acute inflammation. Appendix is not visualized. There is a small hiatal hernia. There is no bowel obstruction. PERITONEUM AND RETROPERITONEUM: No ascites. No free air. VASCULATURE: Aorta is normal in caliber. There are atherosclerotic calcifications of the aorta, left renal artery, and celiac artery. Left renal artery and celiac artery stents are present. LYMPH NODES: Prominent left inguinal lymph nodes measuring up to 11 mm. REPRODUCTIVE ORGANS: Uterus is surgically absent. BONES AND SOFT TISSUES: Extensive thoracolumbar fusion hardware present. Sacroiliac screws were also present. No acute osseous abnormality. No focal soft tissue abnormality. IMPRESSION: 1. Wall thickening of the majority of the sigmoid colon with diverticula, increased in extent compared to prior study. No acute inflammation. Findings may be related to chronic sequelae of colitis or diverticulitis. An underlying neoplastic process is not excluded. 2. Single 4 mm calculus in the right kidney. Electronically signed by: Greig Pique MD 06/17/2024 10:36 PM EDT RP Workstation: [id number]        Scheduled Meds:  allopurinol   100 mg Oral Daily   amiodarone   200 mg Oral Daily   apixaban   2.5 mg Oral BID    carvedilol   6.25 mg Oral BID   clopidogrel   75 mg Oral Q breakfast   dicyclomine  10 mg Oral TID AC   isosorbide  mononitrate  60 mg Oral Daily   lactobacillus  1 g Oral TID WC   sodium chloride  flush  3 mL Intravenous Q12H   Continuous Infusions:  ferric gluconate (FERRLECIT) IVPB     piperacillin-tazobactam (ZOSYN)  IV 3.375 g (06/19/24 0525)     LOS: 2 days   Time spent:  Elsie JAYSON Montclair, DO [facility]  If 7PM-7AM, please contact night-coverage [email or web address]  06/19/2024, 7:58 AM

## 2024-06-19 NOTE — Progress Notes (Signed)
 She might be feeling little bit better this morning.  She is having a lot of diarrhea yesterday.  This is being evaluated.  There are no labs back yet today to see what her sodium is.  She is anemic still.  She does have renal insufficiency.  She has some iron deficiency.  I will have to going give her some IV iron.  Her CBC shows a white cell count 10.2.  Hemoglobin 9.  Platelet count 250,000.  Her appetite is okay.  She has had no vomiting.  She has had no fever.  There is been no obvious bleeding.  There is been no cough.  She has had no shortness of breath.  She has had no rashes.  Her vital signs show temperature of 99.4.  Pulse 75.  Blood pressure 100/42.  Her lungs are clear bilaterally.  She has good air movement bilaterally.  Cardiac exam regular rate and rhythm.  She has no murmurs, rubs or bruits.  Abdomen is soft.  Bowel sounds are present.  She has no fluid wave.  There is no guarding or rebound tenderness.  Extremities shows no clubbing, cyanosis or edema.  Neurological exam shows no focal neurological deficits.  Skin exam shows no rashes, ecchymosis or petechia.  I would have to think that she is not can be able to go home until her sodium is above 130.  As always, she wants to go home today..  She does need some IV iron.  Hopefully, she does not have C. difficile.  As always, we will follow along to try to help out as much as possible.   Jeralyn Crease, MD  Proverbs 18:10

## 2024-06-20 DIAGNOSIS — E871 Hypo-osmolality and hyponatremia: Secondary | ICD-10-CM | POA: Diagnosis not present

## 2024-06-20 LAB — COMPREHENSIVE METABOLIC PANEL WITH GFR
ALT: 12 U/L (ref 0–44)
AST: 17 U/L (ref 15–41)
Albumin: 3.2 g/dL — ABNORMAL LOW (ref 3.5–5.0)
Alkaline Phosphatase: 51 U/L (ref 38–126)
Anion gap: 11 (ref 5–15)
BUN: 71 mg/dL — ABNORMAL HIGH (ref 8–23)
CO2: 23 mmol/L (ref 22–32)
Calcium: 8.5 mg/dL — ABNORMAL LOW (ref 8.9–10.3)
Chloride: 94 mmol/L — ABNORMAL LOW (ref 98–111)
Creatinine, Ser: 2.28 mg/dL — ABNORMAL HIGH (ref 0.44–1.00)
GFR, Estimated: 21 mL/min — ABNORMAL LOW (ref >60)
Glucose, Bld: 144 mg/dL — ABNORMAL HIGH (ref 70–99)
Potassium: 3.6 mmol/L (ref 3.5–5.1)
Sodium: 128 mmol/L — ABNORMAL LOW (ref 135–145)
Total Bilirubin: 0.3 mg/dL (ref 0.0–1.2)
Total Protein: 5.3 g/dL — ABNORMAL LOW (ref 6.5–8.1)

## 2024-06-20 LAB — CBC WITH DIFFERENTIAL/PLATELET
Abs Immature Granulocytes: 0.04 K/uL (ref 0.00–0.07)
Basophils Absolute: 0.1 K/uL (ref 0.0–0.1)
Basophils Relative: 1 %
Eosinophils Absolute: 0.3 K/uL (ref 0.0–0.5)
Eosinophils Relative: 4 %
HCT: 24.6 % — ABNORMAL LOW (ref 36.0–46.0)
Hemoglobin: 8.1 g/dL — ABNORMAL LOW (ref 12.0–15.0)
Immature Granulocytes: 0 %
Lymphocytes Relative: 13 %
Lymphs Abs: 1.1 K/uL (ref 0.7–4.0)
MCH: 31.2 pg (ref 26.0–34.0)
MCHC: 32.9 g/dL (ref 30.0–36.0)
MCV: 94.6 fL (ref 80.0–100.0)
Monocytes Absolute: 1.2 K/uL — ABNORMAL HIGH (ref 0.1–1.0)
Monocytes Relative: 13 %
Neutro Abs: 6.2 K/uL (ref 1.7–7.7)
Neutrophils Relative %: 69 %
Platelets: 214 K/uL (ref 150–400)
RBC: 2.6 MIL/uL — ABNORMAL LOW (ref 3.87–5.11)
RDW: 14.9 % (ref 11.5–15.5)
WBC: 9 K/uL (ref 4.0–10.5)
nRBC: 0 % (ref 0.0–0.2)

## 2024-06-20 LAB — ERYTHROPOIETIN: Erythropoietin: 10.4 m[IU]/mL (ref 2.6–18.5)

## 2024-06-20 LAB — MAGNESIUM: Magnesium: 2.7 mg/dL — ABNORMAL HIGH (ref 1.7–2.4)

## 2024-06-20 MED ORDER — ENSURE PLUS HIGH PROTEIN PO LIQD
237.0000 mL | Freq: Two times a day (BID) | ORAL | Status: DC
  Administered 2024-06-21: 237 mL via ORAL

## 2024-06-20 MED ORDER — HYDROCODONE-ACETAMINOPHEN 5-325 MG PO TABS
1.0000 | ORAL_TABLET | Freq: Four times a day (QID) | ORAL | Status: DC | PRN
  Administered 2024-06-20 – 2024-06-21 (×2): 2 via ORAL
  Filled 2024-06-20 (×2): qty 2

## 2024-06-20 NOTE — Progress Notes (Signed)
 PROGRESS NOTE    [name]  FMW:[id number] DOB: 08/06/1941 DOA: 06/17/2024 PCP: Katrinka Garnette KIDD, MD   Brief Narrative:  [name] is a 83 y.o. female with medical history significant for atrial fib on Eliquis  and Amiodarone  and pacemaker who presents with 2 weeks of diarrhea and abdominal pain with poor p.o. intake fatigue and weakness.  Assessment & Plan:   Principal Problem:   Hyponatremia   Intractable abdominal pain and diarrhea Colitis, nonspecific, POA Rule out intra-abdominal infection -GI panel, C. difficile testing pending although of low value at this point given duration of symptoms -Patient recently completed antibiotics earlier this week but again diarrhea has been ongoing for 2 weeks with no obvious onset or sick contacts - Contact precautions until testing is negative - CT abdomen shows nonspecific sigmoid wall thickening without inflammation - Hold antibiotics in the interim given no fever no leukocytosis and no clear signs or symptoms of infection other than diarrhea which appears to be resolving with supportive care  Severe hypovolemic hyponatremia, resolving  - Continue IV fluids, supportive care - Sodium trending upwards appropriately  Hypokalemia - Secondary to diarrhea, currently within normal limits  Acute on chronic kidney disease, improving -Baseline creatinine 1.7, admitted at 3.6 but downtrending appropriately  -Continue IV fluids, advance diet as tolerated  Heart failure, preserved ejection fraction, without acute exacerbation -Continue IV fluids as above, monitor closely given heart failure diagnosis    CAD s/p RCA stent earlier this year -no acute findings - plavix  stop date in feb 2026   Atrial fibrillation - Continue Eliquis (currently at half dose given AKI and advanced age)  and carvedilol    DVT prophylaxis: apixaban  (ELIQUIS ) tablet 2.5 mg Start: 06/18/24 2200 apixaban  (ELIQUIS ) tablet 2.5 mg  Code Status:   Code Status:  Full Code Family Communication: None present  Status is: Inpatient  Dispo: The patient is from: Home              Anticipated d/c is to: Home              Anticipated d/c date is: 24 to 48 hours              Patient currently not medically stable for discharge  Consultants:  None  Procedures:  None  Antimicrobials:  None indicated  Subjective: No acute issues or events overnight, patient had moderate improvement in the bowel movement frequency and consistency yesterday but unfortunately patient has relapsed this morning with multiple loose to watery stool and worsening cramping.  Objective: Vitals:   06/19/24 2114 06/19/24 2323 06/20/24 0442 06/20/24 0545  BP: (!) 99/36 (!) 106/47 (!) 112/41 (!) 123/42  Pulse: 72 69 66 66  Resp: 17  12   Temp: 98.9 F (37.2 C)  97.7 F (36.5 C)   TempSrc: Oral  Oral   SpO2: 97%  95%   Weight:      Height:        Intake/Output Summary (Last 24 hours) at 06/20/2024 0745 Last data filed at 06/20/2024 0611 Gross per 24 hour  Intake 1173.35 ml  Output 450 ml  Net 723.35 ml   Filed Weights   06/18/24 1624  Weight: 89.1 kg    Examination:  General: No acute distress on bedside commode having difficulty voiding. HEENT:  Normocephalic atraumatic.  Sclerae nonicteric, noninjected.  Extraocular movements intact bilaterally. Neck:  Without mass or deformity.  Trachea is midline. Lungs:  Clear to auscultate bilaterally without rhonchi, wheeze, or rales. Heart:  Regular rate and rhythm.  Without murmurs, rubs, or gallops. Abdomen:  Soft, nontender, nondistended.  Without guarding or rebound. Extremities: Without cyanosis, clubbing, edema, or obvious deformity. Skin:  Warm and dry, no erythema.  Data Reviewed: I have personally reviewed following labs and imaging studies  CBC: Recent Labs  Lab 06/17/24 1044 06/17/24 1926 06/18/24 0538 06/19/24 0408 06/20/24 0341  WBC 10.2 9.0 6.6 10.2 9.0  NEUTROABS 7.2 6.4  --  7.6 6.2  HGB  10.5* 10.5* 8.8* 9.0* 8.1*  HCT 28.8* 30.5* 25.5* 27.5* 24.6*  MCV 87.3 87.9 89.8 93.2 94.6  PLT 310 301 237 250 214   Basic Metabolic Panel: Recent Labs  Lab 06/17/24 2012 06/18/24 0017 06/18/24 0538 06/18/24 0900 06/18/24 1539 06/19/24 0408 06/19/24 0739 06/20/24 0341  NA  --    < > 120* 120* 121*  --  124* 128*  K  --    < > 2.5* 2.8* 3.5  --  3.8 3.6  CL  --    < > 84* 84* 87*  --  90* 94*  CO2  --    < > 22 22 22   --  23 23  GLUCOSE  --    < > 119* 114* 152*  --  123* 144*  BUN  --    < > 83* 80* 76*  --  74* 71*  CREATININE  --    < > 2.68* 2.52* 2.43*  --  2.27* 2.28*  CALCIUM  --    < > 8.4* 8.7* 9.0  --  8.9 8.5*  MG 2.9*  --  2.6*  --   --  2.6*  --  2.7*   < > = values in this interval not displayed.   GFR: Estimated Creatinine Clearance: 22.2 mL/min (A) (by C-G formula based on SCr of 2.28 mg/dL (H)). Liver Function Tests: Recent Labs  Lab 06/17/24 1044 06/17/24 1926 06/19/24 0739 06/20/24 0341  AST 18 20 15 17   ALT 15 16 13 12   ALKPHOS 65 70 59 51  BILITOT 0.5 0.5 0.5 0.3  PROT 6.8 6.8 5.6* 5.3*  ALBUMIN 4.2 4.2 3.5 3.2*   BNP (last 3 results) Recent Labs    05/24/24 1643  PROBNP 819*   Thyroid  Function Tests: Recent Labs    06/18/24 0538  TSH 2.090   Anemia Panel: Recent Labs    06/18/24 0800  TIBC 301  IRON 48    No results found for this or any previous visit (from the past 240 hours).   Radiology Studies: No results found.  Scheduled Meds:  allopurinol   100 mg Oral Daily   amiodarone   200 mg Oral Daily   apixaban   2.5 mg Oral BID   carvedilol   6.25 mg Oral BID   clopidogrel   75 mg Oral Q breakfast   isosorbide  mononitrate  60 mg Oral Daily   lactobacillus  1 g Oral TID WC   sodium chloride  flush  3 mL Intravenous Q12H   Continuous Infusions:  ferric gluconate (FERRLECIT) IVPB Stopped (06/19/24 1823)   piperacillin-tazobactam (ZOSYN)  IV 3.375 g (06/20/24 0609)     LOS: 3 days   Time spent:  Tricia JAYSON Montclair, DO [facility]  If 7PM-7AM, please contact night-coverage [email or web address]  06/20/2024, 7:45 AM

## 2024-06-21 ENCOUNTER — Ambulatory Visit (HOSPITAL_COMMUNITY)
Admission: RE | Admit: 2024-06-21 | Discharge: 2024-06-21 | Disposition: A | Discharge status: Home / Self Care | Source: Ambulatory Visit | Attending: Family Medicine | Admitting: Family Medicine

## 2024-06-21 ENCOUNTER — Other Ambulatory Visit (HOSPITAL_COMMUNITY): Payer: Self-pay

## 2024-06-21 ENCOUNTER — Telehealth (HOSPITAL_COMMUNITY): Payer: Self-pay

## 2024-06-21 DIAGNOSIS — E871 Hypo-osmolality and hyponatremia: Secondary | ICD-10-CM | POA: Diagnosis not present

## 2024-06-21 LAB — GASTROINTESTINAL PANEL BY PCR, STOOL (REPLACES STOOL CULTURE)

## 2024-06-21 LAB — COMPREHENSIVE METABOLIC PANEL WITH GFR
ALT: 11 U/L (ref 0–44)
AST: 11 U/L — ABNORMAL LOW (ref 15–41)
Albumin: 3.3 g/dL — ABNORMAL LOW (ref 3.5–5.0)
Alkaline Phosphatase: 52 U/L (ref 38–126)
Anion gap: 12 (ref 5–15)
BUN: 64 mg/dL — ABNORMAL HIGH (ref 8–23)
CO2: 22 mmol/L (ref 22–32)
Calcium: 9 mg/dL (ref 8.9–10.3)
Chloride: 95 mmol/L — ABNORMAL LOW (ref 98–111)
Creatinine, Ser: 1.98 mg/dL — ABNORMAL HIGH (ref 0.44–1.00)
GFR, Estimated: 25 mL/min — ABNORMAL LOW (ref >60)
Glucose, Bld: 112 mg/dL — ABNORMAL HIGH (ref 70–99)
Potassium: 3.6 mmol/L (ref 3.5–5.1)
Sodium: 129 mmol/L — ABNORMAL LOW (ref 135–145)
Total Bilirubin: 0.4 mg/dL (ref 0.0–1.2)
Total Protein: 5.4 g/dL — ABNORMAL LOW (ref 6.5–8.1)

## 2024-06-21 LAB — CBC WITH DIFFERENTIAL/PLATELET
Abs Immature Granulocytes: 0.03 K/uL (ref 0.00–0.07)
Basophils Absolute: 0.1 K/uL (ref 0.0–0.1)
Basophils Relative: 1 %
Eosinophils Absolute: 0.4 K/uL (ref 0.0–0.5)
Eosinophils Relative: 4 %
HCT: 26.9 % — ABNORMAL LOW (ref 36.0–46.0)
Hemoglobin: 8.5 g/dL — ABNORMAL LOW (ref 12.0–15.0)
Immature Granulocytes: 0 %
Lymphocytes Relative: 12 %
Lymphs Abs: 1.2 K/uL (ref 0.7–4.0)
MCH: 30.5 pg (ref 26.0–34.0)
MCHC: 31.6 g/dL (ref 30.0–36.0)
MCV: 96.4 fL (ref 80.0–100.0)
Monocytes Absolute: 1.3 K/uL — ABNORMAL HIGH (ref 0.1–1.0)
Monocytes Relative: 13 %
Neutro Abs: 7 K/uL (ref 1.7–7.7)
Neutrophils Relative %: 70 %
Platelets: 227 K/uL (ref 150–400)
RBC: 2.79 MIL/uL — ABNORMAL LOW (ref 3.87–5.11)
RDW: 15.2 % (ref 11.5–15.5)
WBC: 9.9 K/uL (ref 4.0–10.5)
nRBC: 0 % (ref 0.0–0.2)

## 2024-06-21 LAB — MAGNESIUM: Magnesium: 2.8 mg/dL — ABNORMAL HIGH (ref 1.7–2.4)

## 2024-06-21 MED ORDER — LOPERAMIDE HCL 2 MG PO CAPS
4.0000 mg | ORAL_CAPSULE | ORAL | Status: DC | PRN
Start: 2024-06-21 — End: 2024-06-22

## 2024-06-21 MED ORDER — DARBEPOETIN ALFA 300 MCG/0.6ML IJ SOSY
300.0000 ug | PREFILLED_SYRINGE | Freq: Once | INTRAMUSCULAR | Status: AC
  Administered 2024-06-21: 300 ug via SUBCUTANEOUS
  Filled 2024-06-21 (×2): qty 0.6

## 2024-06-21 MED ORDER — DIPHENOXYLATE-ATROPINE 2.5-0.025 MG PO TABS
1.0000 | ORAL_TABLET | Freq: Once | ORAL | Status: AC
Start: 2024-06-21 — End: 2024-06-21
  Administered 2024-06-21: 1 via ORAL
  Filled 2024-06-21: qty 1

## 2024-06-21 NOTE — Progress Notes (Signed)
 Looks like she is growing E. coli in her urine.  Her sodium is slowly coming up.  Currently, her sodium is 129.  Potassium 3.6.  BUN 64 creatinine 1.98.  Calcium is 9.  Albumin is 3.3.  Her white cell count is 9.9.  Hemoglobin 8.5.  Platelet count 227,000.  We did go ahead and give her some iron.  I will give her a dose of Aranesp today.  Her birthweight level is only 10.  Her appetite is doing okay.  She has had no nausea or vomiting.  There has been no diarrhea.  She has had no rashes.  There is been no bleeding.  Her vital signs show temperature of 98.8.  Pulse 68.  Blood pressure 127/38.  Her lungs sound relatively clear bilaterally.  Cardiac exam regular rate and rhythm.  She has no murmurs, rubs or bruits.  Abdomen is soft.  Bowel sounds are present.  There is no fluid wave.  There is no palpable liver or spleen tip.  Extremities shows no clubbing, cyanosis or edema.  She has a surgical changes on her left knee.  She has little bit of fullness behind the left knee which is chronic.  Again, it looks like there may be E. coli in the urine.  She is on IV Zosyn for this.  Hopefully, she will be able to go home soon.  Her sodium is improving.  Again we had to give her some Aranesp today to help with her anemia.  I do appreciate everybody's help with her on [address].   Jeralyn Crease, MD  Proverbs 18:22

## 2024-06-21 NOTE — Progress Notes (Signed)
 Mobility Specialist - Progress Note   06/21/24 1117  Mobility  Activity Pivoted/transferred to/from [facility]  Level of Assistance Minimal assist, patient does 75% or more  Assistive Device BSC  Range of Motion/Exercises Active Assistive  Activity Response Tolerated fair  Mobility Referral Yes  Mobility visit 1 Mobility  Mobility Specialist Start Time (ACUTE ONLY) 1100  Mobility Specialist Stop Time (ACUTE ONLY) 1112  Mobility Specialist Time Calculation (min) (ACUTE ONLY) 12 min   Pt was found in bed and agreeable to mobilize. Upon sitting EOB stated feeling drunk. Able to transfer to [facility] and was left with call bell in hand. NT in room during session. Understood to use call bell when finished.   Erminio Leos,  Mobility Specialist Can be reached via Secure Chat

## 2024-06-21 NOTE — Plan of Care (Signed)
   Problem: Education: Goal: Knowledge of General Education information will improve Description Including pain rating scale, medication(s)/side effects and non-pharmacologic comfort measures Outcome: Progressing   Problem: Health Behavior/Discharge Planning: Goal: Ability to manage health-related needs will improve Outcome: Progressing   Problem: Clinical Measurements: Goal: Ability to maintain clinical measurements within normal limits will improve Outcome: Progressing Goal: Will remain free from infection Outcome: Progressing Goal: Diagnostic test results will improve Outcome: Progressing Goal: Respiratory complications will improve Outcome: Progressing Goal: Cardiovascular complication will be avoided Outcome: Progressing   Problem: Coping: Goal: Level of anxiety will decrease Outcome: Progressing   Problem: Nutrition: Goal: Adequate nutrition will be maintained Outcome: Progressing   Problem: Elimination: Goal: Will not experience complications related to bowel motility Outcome: Progressing Goal: Will not experience complications related to urinary retention Outcome: Progressing

## 2024-06-21 NOTE — Progress Notes (Signed)
 Transition of Care [facility]) - Inpatient Brief Assessment  Patient Details  Name: [name] MRN: [id number] Date of Birth: 13-May-1941  Transition of Care [facility]) CM/SW Contact:    Duwaine GORMAN Aran, LCSW Phone Number: 06/21/2024, 8:45 AM  Clinical Narrative: Screening completed. No care management needs identified at this time.  Transition of Care Asessment: Insurance and Status: Insurance coverage has been reviewed Patient has primary care physician: Yes Home environment has been reviewed: Resides in single family home with spouse Prior level of function:: Independent with ADLs at baseline Prior/Current Home Services: No current home services Social Drivers of Health Review: SDOH reviewed no interventions necessary Readmission risk has been reviewed: Yes Transition of care needs: no transition of care needs at this time   06/21/24 0843  Readmission Prevention Plan - Moderate Risk  Transportation Screening Complete  Home Care Screening Complete  Moderate Risk Medication Review Complete

## 2024-06-21 NOTE — Progress Notes (Signed)
 PROGRESS NOTE    [name]  FMW:[id number] DOB: [birth year] DOA: 06/17/2024 PCP: Katrinka Garnette KIDD, MD   Brief Narrative:  [name] is a 83 y.o. female with medical history significant for atrial fib on Eliquis  and Amiodarone  and pacemaker who presents with 2 weeks of diarrhea and abdominal pain with poor p.o. intake fatigue and weakness.  Assessment & Plan:   Principal Problem:   Hyponatremia   Intractable abdominal pain and diarrhea Colitis, nonspecific, POA Rule out intra-abdominal infection -GI panel negative C. difficile testing pending although of low value at this point given duration of symptoms -Patient recently completed antibiotics earlier this week but again diarrhea has been ongoing for 2 weeks with no obvious onset or sick contacts - CT abdomen shows nonspecific sigmoid wall thickening without inflammation - Hold antibiotics in the interim given no fever no leukocytosis and no clear signs or symptoms of infection other than diarrhea which appears to be resolving with supportive care - Imodium PRN - follow clinically  Severe hypovolemic hyponatremia, resolving  - Continue IV fluids, supportive care - Sodium trending upwards appropriately  Hypokalemia - Secondary to diarrhea, currently within normal limits  Acute on chronic kidney disease, improving -Baseline creatinine 1.7, admitted at 3.6 but downtrending appropriately  -Continue IV fluids, advance diet as tolerated  Heart failure, preserved ejection fraction, without acute exacerbation -Continue IV fluids as above, monitor closely given heart failure diagnosis    CAD s/p RCA stent earlier this year -no acute findings - plavix  stop date in feb 2026   Atrial fibrillation - Continue Eliquis (currently at half dose given AKI and advanced age)  and carvedilol    DVT prophylaxis: apixaban  (ELIQUIS ) tablet 2.5 mg Start: 06/18/24 2200 apixaban  (ELIQUIS ) tablet 2.5 mg  Code Status:   Code Status: Full  Code Family Communication: None present  Status is: Inpatient  Dispo: The patient is from: Home              Anticipated d/c is to: Home              Anticipated d/c date is: 24 to 48 hours              Patient currently not medically stable for discharge  Consultants:  None  Procedures:  None  Antimicrobials:  None indicated  Subjective: No acute issues or events overnight, patient had moderate improvement in the bowel movement frequency and consistency yesterday but unfortunately patient has relapsed this morning with multiple loose to watery stool and worsening cramping.  Objective: Vitals:   06/20/24 2107 06/21/24 0446 06/21/24 0925 06/21/24 1329  BP: (!) 129/36 (!) 127/38 (!) 132/50 (!) 142/53  Pulse: 73 68 77 79  Resp: 16 18    Temp: 99.3 F (37.4 C) 98.8 F (37.1 C)  99.1 F (37.3 C)  TempSrc: Oral Oral  Oral  SpO2: 96% 95% 94% 97%  Weight:      Height:        Intake/Output Summary (Last 24 hours) at 06/21/2024 1708 Last data filed at 06/21/2024 0923 Gross per 24 hour  Intake 480 ml  Output --  Net 480 ml   Filed Weights   06/18/24 1624  Weight: 89.1 kg    Examination:  General: No acute distress on bedside commode having difficulty voiding. HEENT:  Normocephalic atraumatic.  Sclerae nonicteric, noninjected.  Extraocular movements intact bilaterally. Neck:  Without mass or deformity.  Trachea is midline. Lungs:  Clear to auscultate bilaterally without rhonchi, wheeze, or rales.  Heart:  Regular rate and rhythm.  Without murmurs, rubs, or gallops. Abdomen:  Soft, nontender, nondistended.  Without guarding or rebound. Extremities: Without cyanosis, clubbing, edema, or obvious deformity. Skin:  Warm and dry, no erythema.  Data Reviewed: I have personally reviewed following labs and imaging studies  CBC: Recent Labs  Lab 06/17/24 1044 06/17/24 1926 06/18/24 0538 06/19/24 0408 06/20/24 0341 06/21/24 0318  WBC 10.2 9.0 6.6 10.2 9.0 9.9   NEUTROABS 7.2 6.4  --  7.6 6.2 7.0  HGB 10.5* 10.5* 8.8* 9.0* 8.1* 8.5*  HCT 28.8* 30.5* 25.5* 27.5* 24.6* 26.9*  MCV 87.3 87.9 89.8 93.2 94.6 96.4  PLT 310 301 237 250 214 227   Basic Metabolic Panel: Recent Labs  Lab 06/17/24 2012 06/18/24 0017 06/18/24 0538 06/18/24 0900 06/18/24 1539 06/19/24 0408 06/19/24 0739 06/20/24 0341 06/21/24 0318  NA  --    < > 120* 120* 121*  --  124* 128* 129*  K  --    < > 2.5* 2.8* 3.5  --  3.8 3.6 3.6  CL  --    < > 84* 84* 87*  --  90* 94* 95*  CO2  --    < > 22 22 22   --  23 23 22   GLUCOSE  --    < > 119* 114* 152*  --  123* 144* 112*  BUN  --    < > 83* 80* 76*  --  74* 71* 64*  CREATININE  --    < > 2.68* 2.52* 2.43*  --  2.27* 2.28* 1.98*  CALCIUM  --    < > 8.4* 8.7* 9.0  --  8.9 8.5* 9.0  MG 2.9*  --  2.6*  --   --  2.6*  --  2.7* 2.8*   < > = values in this interval not displayed.   GFR: Estimated Creatinine Clearance: 25.6 mL/min (A) (by C-G formula based on SCr of 1.98 mg/dL (H)). Liver Function Tests: Recent Labs  Lab 06/17/24 1044 06/17/24 1926 06/19/24 0739 06/20/24 0341 06/21/24 0318  AST 18 20 15 17  11*  ALT 15 16 13 12 11   ALKPHOS 65 70 59 51 52  BILITOT 0.5 0.5 0.5 0.3 0.4  PROT 6.8 6.8 5.6* 5.3* 5.4*  ALBUMIN 4.2 4.2 3.5 3.2* 3.3*   BNP (last 3 results) Recent Labs    05/24/24 1643  PROBNP 819*   Thyroid  Function Tests: No results for input(s): TSH, T4TOTAL, FREET4, T3FREE, THYROIDAB in the last 72 hours.  Anemia Panel: No results for input(s): VITAMINB12, FOLATE, FERRITIN, TIBC, IRON, RETICCTPCT in the last 72 hours.   Recent Results (from the past 240 hours)  Gastrointestinal Panel by PCR , Stool     Status: None   Collection Time: 06/20/24 10:00 AM   Specimen: Stool  Result Value Ref Range Status   Campylobacter species NOT DETECTED NOT DETECTED Final   Plesimonas shigelloides NOT DETECTED NOT DETECTED Final   Salmonella species NOT DETECTED NOT DETECTED Final   Yersinia  enterocolitica NOT DETECTED NOT DETECTED Final   Vibrio species NOT DETECTED NOT DETECTED Final   Vibrio cholerae NOT DETECTED NOT DETECTED Final   Enteroaggregative E coli (EAEC) NOT DETECTED NOT DETECTED Final   Enteropathogenic E coli (EPEC) NOT DETECTED NOT DETECTED Final   Enterotoxigenic E coli (ETEC) NOT DETECTED NOT DETECTED Final   Shiga like toxin producing E coli (STEC) NOT DETECTED NOT DETECTED Final   Shigella/Enteroinvasive E coli (EIEC) NOT DETECTED NOT  DETECTED Final   Cryptosporidium NOT DETECTED NOT DETECTED Final   Cyclospora cayetanensis NOT DETECTED NOT DETECTED Final   Entamoeba histolytica NOT DETECTED NOT DETECTED Final   Giardia lamblia NOT DETECTED NOT DETECTED Final   Adenovirus F40/41 NOT DETECTED NOT DETECTED Final   Astrovirus NOT DETECTED NOT DETECTED Final   Norovirus GI/GII NOT DETECTED NOT DETECTED Final   Rotavirus A NOT DETECTED NOT DETECTED Final   Sapovirus (I, II, IV, and V) NOT DETECTED NOT DETECTED Final    Comment: Performed at [facility], [address], [place], [place] [address]     Radiology Studies: No results found.  Scheduled Meds:  allopurinol   100 mg Oral Daily   amiodarone   200 mg Oral Daily   apixaban   2.5 mg Oral BID   carvedilol   6.25 mg Oral BID   clopidogrel   75 mg Oral Q breakfast   feeding supplement  237 mL Oral BID BM   isosorbide  mononitrate  60 mg Oral Daily   lactobacillus  1 g Oral TID WC   sodium chloride  flush  3 mL Intravenous Q12H   Continuous Infusions:     LOS: 4 days   Time spent:  Elsie JAYSON Montclair, DO [facility]  If 7PM-7AM, please contact night-coverage [email or web address]  06/21/2024, 5:08 PM

## 2024-06-21 NOTE — Progress Notes (Deleted)
   06/21/24 0843  TOC Brief Assessment  Insurance and Status Reviewed  Patient has primary care physician Yes  Home environment has been reviewed Resides in single family home with spouse  Prior level of function: Independent with ADLs at baseline  Prior/Current Home Services No current home services  Social Drivers of Health Review SDOH reviewed no interventions necessary  Readmission risk has been reviewed Yes  Transition of care needs no transition of care needs at this time

## 2024-06-21 NOTE — Telephone Encounter (Signed)
 Pharmacy Patient Advocate Encounter  Insurance verification completed.    The patient is insured through [employer]. Patient has Medicare and is not eligible for a copay card, but may be able to apply for patient assistance or Medicare RX Payment Plan (Patient Must reach out to their plan, if eligible for payment plan), if available.    Ran test claim for Eliquis  2.5mg   and the current 30 day co-pay is $314.64.   This test claim was processed through [facility]- copay amounts may vary at other pharmacies due to pharmacy/plan contracts, or as the patient moves through the different stages of their insurance plan.

## 2024-06-22 ENCOUNTER — Other Ambulatory Visit (HOSPITAL_BASED_OUTPATIENT_CLINIC_OR_DEPARTMENT_OTHER): Payer: Self-pay

## 2024-06-22 ENCOUNTER — Other Ambulatory Visit: Payer: Self-pay

## 2024-06-22 ENCOUNTER — Other Ambulatory Visit (HOSPITAL_COMMUNITY): Payer: Self-pay

## 2024-06-22 DIAGNOSIS — E871 Hypo-osmolality and hyponatremia: Secondary | ICD-10-CM | POA: Diagnosis not present

## 2024-06-22 LAB — CBC WITH DIFFERENTIAL/PLATELET
Abs Immature Granulocytes: 0.07 K/uL (ref 0.00–0.07)
Basophils Absolute: 0.1 K/uL (ref 0.0–0.1)
Basophils Relative: 1 %
Eosinophils Absolute: 0.4 K/uL (ref 0.0–0.5)
Eosinophils Relative: 4 %
HCT: 26.5 % — ABNORMAL LOW (ref 36.0–46.0)
Hemoglobin: 8.6 g/dL — ABNORMAL LOW (ref 12.0–15.0)
Immature Granulocytes: 1 %
Lymphocytes Relative: 14 %
Lymphs Abs: 1.4 K/uL (ref 0.7–4.0)
MCH: 31.7 pg (ref 26.0–34.0)
MCHC: 32.5 g/dL (ref 30.0–36.0)
MCV: 97.8 fL (ref 80.0–100.0)
Monocytes Absolute: 1.2 K/uL — ABNORMAL HIGH (ref 0.1–1.0)
Monocytes Relative: 12 %
Neutro Abs: 7 K/uL (ref 1.7–7.7)
Neutrophils Relative %: 68 %
Platelets: 240 K/uL (ref 150–400)
RBC: 2.71 MIL/uL — ABNORMAL LOW (ref 3.87–5.11)
RDW: 15.1 % (ref 11.5–15.5)
WBC: 10.1 K/uL (ref 4.0–10.5)
nRBC: 0 % (ref 0.0–0.2)

## 2024-06-22 LAB — COMPREHENSIVE METABOLIC PANEL WITH GFR
ALT: 11 U/L (ref 0–44)
AST: 12 U/L — ABNORMAL LOW (ref 15–41)
Albumin: 3.3 g/dL — ABNORMAL LOW (ref 3.5–5.0)
Alkaline Phosphatase: 50 U/L (ref 38–126)
Anion gap: 9 (ref 5–15)
BUN: 56 mg/dL — ABNORMAL HIGH (ref 8–23)
CO2: 24 mmol/L (ref 22–32)
Calcium: 9 mg/dL (ref 8.9–10.3)
Chloride: 97 mmol/L — ABNORMAL LOW (ref 98–111)
Creatinine, Ser: 1.61 mg/dL — ABNORMAL HIGH (ref 0.44–1.00)
GFR, Estimated: 32 mL/min — ABNORMAL LOW (ref >60)
Glucose, Bld: 98 mg/dL (ref 70–99)
Potassium: 3.8 mmol/L (ref 3.5–5.1)
Sodium: 130 mmol/L — ABNORMAL LOW (ref 135–145)
Total Bilirubin: 0.3 mg/dL (ref 0.0–1.2)
Total Protein: 5.6 g/dL — ABNORMAL LOW (ref 6.5–8.1)

## 2024-06-22 LAB — MAGNESIUM: Magnesium: 2.9 mg/dL — ABNORMAL HIGH (ref 1.7–2.4)

## 2024-06-22 MED ORDER — ONDANSETRON HCL 4 MG PO TABS: 4.0000 mg | ORAL_TABLET | Freq: Every day | ORAL | 1 refills | Status: DC | PRN

## 2024-06-22 MED ORDER — CARVEDILOL 3.125 MG PO TABS
3.1250 mg | ORAL_TABLET | Freq: Two times a day (BID) | ORAL | Status: DC
  Administered 2024-06-22: 3.125 mg via ORAL

## 2024-06-22 MED ORDER — ISOSORBIDE MONONITRATE ER 30 MG PO TB24
30.0000 mg | ORAL_TABLET | Freq: Every day | ORAL | 0 refills | Status: DC
  Filled 2024-06-22: qty 30, 30d supply, fill #0

## 2024-06-22 MED ORDER — INDAPAMIDE 1.25 MG PO TABS
1.2500 mg | ORAL_TABLET | Freq: Every day | ORAL | 0 refills | Status: AC
  Filled 2024-06-22: qty 30, 30d supply, fill #0

## 2024-06-22 MED ORDER — CARVEDILOL 3.125 MG PO TABS
3.1250 mg | ORAL_TABLET | Freq: Two times a day (BID) | ORAL | 0 refills | Status: DC
  Filled 2024-06-22: qty 180, 90d supply, fill #0

## 2024-06-22 MED ORDER — LOPERAMIDE HCL 2 MG PO CAPS
4.0000 mg | ORAL_CAPSULE | ORAL | 0 refills | Status: AC | PRN
  Filled 2024-06-22: qty 30, fill #0

## 2024-06-22 MED ORDER — APIXABAN 2.5 MG PO TABS
2.5000 mg | ORAL_TABLET | Freq: Two times a day (BID) | ORAL | 0 refills | Status: DC
  Filled 2024-06-22: qty 60, 30d supply, fill #0

## 2024-06-22 MED ORDER — FLORANEX PO PACK
1.0000 g | PACK | Freq: Three times a day (TID) | ORAL | 0 refills | Status: DC
  Filled 2024-06-22: qty 96, 32d supply, fill #0

## 2024-06-22 MED ORDER — AMOXICILLIN-POT CLAVULANATE 875-125 MG PO TABS
1.0000 | ORAL_TABLET | Freq: Two times a day (BID) | ORAL | 0 refills | Status: AC
  Filled 2024-06-22: qty 4, 2d supply, fill #0

## 2024-06-22 NOTE — Progress Notes (Signed)
 Discharge instructions given questions asked and answered, discharge medications delivrerd to th patient at the bedside in a secure bag.

## 2024-06-22 NOTE — Discharge Summary (Signed)
 Physician Discharge Summary  [name] FMW:[id number] DOB: 01/30/1941 DOA: 06/17/2024  PCP: Tricia Garnette KIDD, MD  Admit date: 06/17/2024 Discharge date: 06/22/2024  Admitted From: Home Disposition: Home  Recommendations for Outpatient Follow-up:  Follow up with PCP in 1-2 weeks Follow-up with oncology as scheduled  Home Health: None Equipment/Devices: None  Discharge Condition: Stable CODE STATUS: Full Diet recommendation: Low-salt low-fat low-carb diet  Brief/Interim Summary: [name] is a 83 y.o. female with medical history significant for atrial fib on Eliquis  and Amiodarone  and pacemaker who presents with 2 weeks of diarrhea and abdominal pain with poor p.o. intake fatigue and weakness.  Patient admitted as above with subacute intractable and profound diarrhea with concurrent fatigue and weakness, likely secondary to inflammatory versus reactive colitis.  Infection appears to be ruled out at this time given negative cultures.  Patient improving somewhat over the past 24 hours with supportive care, Imodium and now tolerating p.o. quite well.  Would defer to outpatient follow-up with PCP and oncology for further evaluation and treatment.  Patient initially presumed to have infectious diarrhea but after cessation of antibiotics and continue supportive care she continued to improve.  There is also mention in the chart about concurrent UTI, this is a prior diagnosis, noted prior to hospitalization on culture drawn on 06/07/2024.  She had already completed antibiotics for this infection.  Patient was initially on Zosyn here for presumed GI infection but again has been discontinued without any change or worsening of her symptoms.  Discharge Diagnoses:  Principal Problem:   Hyponatremia  Intractable abdominal pain and diarrhea, resolved Colitis, nonspecific, POA Intra-abdominal infection ruled out -GI panel negative C. difficile testing remains pending although of low value at  this point given duration of symptoms - Patient completed antibiotics for UTI 2 weeks prior to hospitalization, Zosyn discontinued here given unlikely infectious process -remains afebrile without leukocytosis or worsening symptoms despite cessation of antibiotics - CT abdomen shows nonspecific sigmoid wall thickening without inflammation   Severe hypovolemic hyponatremia, resolving  - Continue to advance diet as tolerated, labs correcting appropriately   Hypokalemia - Secondary to diarrhea, currently improved with p.o. intake   Acute on chronic kidney disease, improving -Baseline creatinine 1.7, admitted at 3.6 but downtrending appropriately -currently 1.6 at baseline   Heart failure, preserved ejection fraction, without acute exacerbation - Resume home medications, multiple blood pressure medications as below decreased due to well-controlled blood pressure here off numerous home medications   CAD s/p RCA stent earlier this year -no acute findings - plavix  stop date in feb 2026   Atrial fibrillation - Continue Eliquis (currently at half dose given AKI and advanced age)  and carvedilol     Discharge Instructions  Discharge Instructions     Call MD for:  difficulty breathing, headache or visual disturbances   Complete by: As directed    Call MD for:  extreme fatigue   Complete by: As directed    Call MD for:  hives   Complete by: As directed    Call MD for:  persistant dizziness or light-headedness   Complete by: As directed    Call MD for:  persistant nausea and vomiting   Complete by: As directed    Call MD for:  severe uncontrolled pain   Complete by: As directed    Call MD for:  temperature >100.4   Complete by: As directed    Diet - low sodium heart healthy   Complete by: As directed    Increase activity  slowly   Complete by: As directed       Allergies as of 06/22/2024       Reactions   Codeine Phosphate Anaphylaxis, Shortness Of Breath, Swelling   Contrast Media  [iodinated Contrast Media] Swelling   Swelling of the face. Reaction was to ionic contrast many years ago. Patient has had non-ionic contrast many time without pre medication and has had no reaction.    Statins Other (See Comments)   Hyperactivity   Tramadol Palpitations, Other (See Comments)   Hyperactivity        Medication List     STOP taking these medications    losartan  50 MG tablet Commonly known as: COZAAR    meclizine  12.5 MG tablet Commonly known as: ANTIVERT    spironolactone  25 MG tablet Commonly known as: ALDACTONE    sulfamethoxazole-trimethoprim 400-80 MG tablet Commonly known as: Bactrim       TAKE these medications    allopurinol  100 MG tablet Commonly known as: ZYLOPRIM  TAKE 1 TABLET BY MOUTH EVERY DAY   amiodarone  200 MG tablet Commonly known as: PACERONE  TAKE 400 MG (2 TABLETS) ONE DOSE AS NEED FOR A FAST HEART RATE ,IF AFTER 12 HOURS STILL PRESENT TAKE 200 MG ( 1 TABLET) ONE DOSE FOR EACH EPISODE   amoxicillin-clavulanate 875-125 MG tablet Commonly known as: AUGMENTIN Take 1 tablet by mouth 2 (two) times daily for 2 days.   apixaban  2.5 MG Tabs tablet Commonly known as: ELIQUIS  Take 1 tablet (2.5 mg total) by mouth 2 (two) times daily. What changed:  medication strength how much to take   bismuth  subsalicylate 262 MG/15ML suspension Commonly known as: PEPTO BISMOL Take 30 mLs by mouth every 6 (six) hours as needed for diarrhea or loose stools.   carvedilol  3.125 MG tablet Commonly known as: COREG  Take 1 tablet (3.125 mg total) by mouth 2 (two) times daily. What changed:  medication strength how much to take   clopidogrel  75 MG tablet Commonly known as: PLAVIX  Take 1 tablet (75 mg total) by mouth daily with breakfast.   colchicine 0.6 MG tablet Take 0.6 mg by mouth daily as needed (Gout).   cyanocobalamin  1000 MCG tablet Commonly known as: VITAMIN B12 Take 1,000 mcg by mouth daily.   diclofenac sodium 1 % Gel Commonly known  as: VOLTAREN Apply 2 g topically 4 (four) times daily as needed (pain).   furosemide  80 MG tablet Commonly known as: LASIX  Take 1 tablet (80 mg total) by mouth 2 (two) times daily. May take a additional if weight is 3 lbs or more with daily weight What changed: when to take this   HYDROcodone -acetaminophen  5-325 MG tablet Commonly known as: NORCO/VICODIN Take 1 tablet by mouth every 6 (six) hours as needed for moderate pain (pain score 4-6).   hyoscyamine  0.125 MG Tbdp disintergrating tablet Commonly known as: ANASPAZ  Place 1 tablet (0.125 mg total) under the tongue every 6 (six) hours as needed (abdominal cramping).   indapamide  1.25 MG tablet Commonly known as: LOZOL  Take 1 tablet (1.25 mg total) by mouth daily. What changed:  medication strength how much to take   isosorbide  mononitrate 30 MG 24 hr tablet Commonly known as: IMDUR  Take 1 tablet (30 mg total) by mouth daily. May take an extra dose if you use Nitroglycerin   sublingual tablet for 2 days afterwards What changed:  medication strength how much to take   lactobacillus Pack Take 1 packet (1 g total) by mouth 3 (three) times daily with meals.   lidocaine   5 % ointment Commonly known as: XYLOCAINE  Apply 1 application. topically as needed.   loperamide 2 MG capsule Commonly known as: IMODIUM Take 2 capsules (4 mg total) by mouth as needed for diarrhea or loose stools.   nitroGLYCERIN  0.4 MG SL tablet Commonly known as: NITROSTAT  DISSOLVE 1 TABLET UNDER THE TONGUE EVERY 5 MINUTES AS NEEDED FOR CHEST PAIN   nystatin  powder Commonly known as: MYCOSTATIN /NYSTOP  Apply 1 Application topically 3 (three) times daily.   TEARS RENEWED OP Apply 1 drop to eye daily as needed (dry eyes).        Allergies  Allergen Reactions   Codeine Phosphate Anaphylaxis, Shortness Of Breath and Swelling   Contrast Media [Iodinated Contrast Media] Swelling    Swelling of the face. Reaction was to ionic contrast many years ago.  Patient has had non-ionic contrast many time without pre medication and has had no reaction.    Statins Other (See Comments)    Hyperactivity   Tramadol Palpitations and Other (See Comments)    Hyperactivity    Consultations: Oncology note  Procedures/Studies: CT Renal Stone Study Result Date: 06/17/2024 EXAM: CT UROGRAM 06/17/2024 10:29:59 PM TECHNIQUE: CT of the abdomen and pelvis was performed without intravenous contrast as per CT urogram protocol. Multiplanar reformatted images as well as MIP urogram images are provided for review. Automated exposure control, iterative reconstruction, and/or weight based adjustment of the mA/kV was utilized to reduce the radiation dose to as low as reasonably achievable. COMPARISON: CT chest abdomen and pelvis 10/30/2023. CLINICAL HISTORY: Abdominal/flank pain, stone suspected. Per triage note: Pt arrives to triage via wheelchair accompanied by husband. PT was called and told to come to the ER because her sodium and potassium are low. Pt endorses feeling weak. FINDINGS: LOWER CHEST: No acute abnormality. LIVER: Left-sided pneumobilia is again seen. GALLBLADDER AND BILE DUCTS: Gallbladder is surgically absent. Common bile duct is dilated measuring 12 mm similar to prior. SPLEEN: No acute abnormality. PANCREAS: No acute abnormality. ADRENAL GLANDS: 13 mm left adrenal adenoma is unchanged. KIDNEYS, URETERS AND BLADDER: There is a single 4 mm calculus in the right kidney. No stones in the ureters. No hydronephrosis. No perinephric or periureteral stranding. There is a small midline lower abdominal wall hernia containing a portion of the bladder. GI AND BOWEL: Stomach demonstrates no acute abnormality. There is wall thickening of the majority of the sigmoid colon which has increased and extent when compared to the prior study. Diverticula are present. No acute inflammation. Appendix is not visualized. There is a small hiatal hernia. There is no bowel obstruction.  PERITONEUM AND RETROPERITONEUM: No ascites. No free air. VASCULATURE: Aorta is normal in caliber. There are atherosclerotic calcifications of the aorta, left renal artery, and celiac artery. Left renal artery and celiac artery stents are present. LYMPH NODES: Prominent left inguinal lymph nodes measuring up to 11 mm. REPRODUCTIVE ORGANS: Uterus is surgically absent. BONES AND SOFT TISSUES: Extensive thoracolumbar fusion hardware present. Sacroiliac screws were also present. No acute osseous abnormality. No focal soft tissue abnormality. IMPRESSION: 1. Wall thickening of the majority of the sigmoid colon with diverticula, increased in extent compared to prior study. No acute inflammation. Findings may be related to chronic sequelae of colitis or diverticulitis. An underlying neoplastic process is not excluded. 2. Single 4 mm calculus in the right kidney. Electronically signed by: Greig Pique MD 06/17/2024 10:36 PM EDT RP Workstation: [id number]   ECHOCARDIOGRAM COMPLETE Result Date: 06/15/2024    ECHOCARDIOGRAM REPORT   Patient Name:   HAYLY  CHRISTELLA LEE  Date of Exam: 06/15/2024 Medical Rec #:  [id number]       Height:       68.0 in Accession #:    [id number]      Weight:       187.4 lb Date of Birth:  September 14, 1940       BSA:          1.988 m Patient Age:    83 years        BP:           118/74 mmHg Patient Gender: F               HR:           77 bpm. Exam Location:  [address] Procedure: 2D Echo, 3D Echo, Cardiac Doppler and Color Doppler (Both Spectral            and Color Flow Doppler were utilized during procedure). Indications:    R07.2 Precordial pain; R06.9 DOE; R60.0 Lower extremity edema  History:        Patient has prior history of Echocardiogram examinations, most                 recent 07/10/2021. CHF, CAD, PAD, Arrythmias:Atrial Fibrillation,                 Signs/Symptoms:Chest Pain, Dyspnea and Edema; Risk                 Factors:Hypertension, Dyslipidemia, CKD stage 3 and Non-Smoker.                  Patient complains of intermittent chest pains. DOE and leg                 edema.  Sonographer:    Annabella Cater RVT, RDCS (AE), RDMS Referring Phys: [id number] DAVID W [facility]  Sonographer Comments: Suboptimal parasternal window, suboptimal apical window and patient is obese. Kyphotic. IMPRESSIONS  1. Left ventricular ejection fraction, by estimation, is 65 to 70%. Left ventricular ejection fraction by 3D volume is 69 %. The left ventricle has normal function. The left ventricle has no regional wall motion abnormalities. There is mild asymmetric left ventricular hypertrophy of the basal-septal segment. Left ventricular diastolic parameters are consistent with Grade I diastolic dysfunction (impaired relaxation).  2. Right ventricular systolic function is normal. The right ventricular size is mildly enlarged.  3. The mitral valve is grossly normal. Trivial mitral valve regurgitation.  4. The aortic valve is tricuspid. Aortic valve regurgitation is not visualized. Aortic valve sclerosis is present, with no evidence of aortic valve stenosis.  5. Cannot exclude a small PFO. Comparison(s): Changes from prior study are noted. 07/20/2021: LVEF 60%. FINDINGS  Left Ventricle: Left ventricular ejection fraction, by estimation, is 65 to 70%. Left ventricular ejection fraction by 3D volume is 69 %. The left ventricle has normal function. The left ventricle has no regional wall motion abnormalities. The left ventricular internal cavity size was normal in size. There is mild asymmetric left ventricular hypertrophy of the basal-septal segment. Left ventricular diastolic parameters are consistent with Grade I diastolic dysfunction (impaired relaxation). Indeterminate filling pressures. Right Ventricle: The right ventricular size is mildly enlarged. No increase in right ventricular wall thickness. Right ventricular systolic function is normal. Left Atrium: Left atrial size was normal in size. Right Atrium: Right atrial size was normal  in size. Pericardium: There is no evidence of pericardial effusion. Mitral Valve: The mitral valve is grossly normal. Trivial  mitral valve regurgitation. Tricuspid Valve: The tricuspid valve is grossly normal. Tricuspid valve regurgitation is trivial. Aortic Valve: The aortic valve is tricuspid. Aortic valve regurgitation is not visualized. Aortic valve sclerosis is present, with no evidence of aortic valve stenosis. Aortic valve mean gradient measures 5.0 mmHg. Aortic valve peak gradient measures 10.5 mmHg. Aortic valve area, by VTI measures 2.75 cm. Pulmonic Valve: The pulmonic valve was grossly normal. Pulmonic valve regurgitation is trivial. Aorta: The aortic root and ascending aorta are structurally normal, with no evidence of dilitation. IAS/Shunts: The interatrial septum is aneurysmal. There is right bowing of the interatrial septum, suggestive of elevated left atrial pressure. Cannot exclude a small PFO. Additional Comments: 3D was performed not requiring image post processing on an independent workstation and was normal. A device lead is visualized.  LEFT VENTRICLE PLAX 2D LVIDd:         4.59 cm         Diastology LVIDs:         2.31 cm         LV e' medial:    7.28 cm/s LV PW:         1.19 cm         LV E/e' medial:  12.6 LV IVS:        0.96 cm         LV e' lateral:   8.40 cm/s LVOT diam:     1.99 cm         LV E/e' lateral: 10.9 LV SV:         94 LV SV Index:   47 LVOT Area:     3.11 cm        3D Volume EF                                LV 3D EF:    Left                                             ventricul                                             ar                                             ejection                                             fraction                                             by 3D  volume is                                             69 %.                                 3D Volume EF:                                3D EF:        69 %                                 LV EDV:       159 ml                                LV ESV:       49 ml                                LV SV:        110 ml RIGHT VENTRICLE RV S prime:     16.40 cm/s  PULMONARY VEINS TAPSE (M-mode): 2.6 cm      Diastolic Velocity: 44.50 cm/s                             S/D Velocity:       1.60                             Systolic Velocity:  70.20 cm/s LEFT ATRIUM           Index        RIGHT ATRIUM           Index LA diam:      3.75 cm 1.89 cm/m   RA Area:     17.80 cm LA Vol (A2C): 37.0 ml 18.61 ml/m  RA Volume:   56.10 ml  28.22 ml/m LA Vol (A4C): 60.9 ml 30.63 ml/m  AORTIC VALVE                     PULMONIC VALVE AV Area (Vmax):    2.55 cm      PV Vmax:       1.04 m/s AV Area (Vmean):   2.42 cm      PV Peak grad:  4.4 mmHg AV Area (VTI):     2.75 cm AV Vmax:           162.00 cm/s AV Vmean:          108.000 cm/s AV VTI:            0.343 m AV Peak Grad:      10.5 mmHg AV Mean Grad:      5.0 mmHg LVOT Vmax:         133.00 cm/s LVOT Vmean:        83.900 cm/s LVOT VTI:          0.303 m LVOT/AV VTI  ratio: 0.88  AORTA Ao Root diam: 3.18 cm Ao Asc diam:  2.75 cm Ao Arch diam: 2.3 cm MITRAL VALVE                TRICUSPID VALVE MV Area (PHT): 3.12 cm     TR Peak grad:   33.4 mmHg MV Decel Time: 243 msec     TR Vmax:        289.00 cm/s MV E velocity: 91.40 cm/s MV A velocity: 102.00 cm/s  SHUNTS MV E/A ratio:  0.90         Systemic VTI:  0.30 m                             Systemic Diam: 1.99 cm Vinie Maxcy MD Electronically signed by Vinie Maxcy MD Signature Date/Time: 06/15/2024/3:59:18 PM    Final      Subjective: Acute issues or events overnight last bowel movement yesterday was somewhat soft but no bowel movements overnight or this morning.  Otherwise denies headache fever chills nausea vomiting shortness of breath or chest pain   Discharge Exam: Vitals:   06/21/24 2024 06/22/24 0441  BP: (!) 117/44 (!) 123/53  Pulse: 78 73  Resp: 17 16  Temp: 99.5 F (37.5 C)  98.9 F (37.2 C)  SpO2: 94% 95%   Vitals:   06/21/24 0925 06/21/24 1329 06/21/24 2024 06/22/24 0441  BP: (!) 132/50 (!) 142/53 (!) 117/44 (!) 123/53  Pulse: 77 79 78 73  Resp:   17 16  Temp:  99.1 F (37.3 C) 99.5 F (37.5 C) 98.9 F (37.2 C)  TempSrc:  Oral Oral Oral  SpO2: 94% 97% 94% 95%  Weight:      Height:        General: Pt is alert, awake, not in acute distress Cardiovascular: RRR, S1/S2 +, no rubs, no gallops Respiratory: CTA bilaterally, no wheezing, no rhonchi Abdominal: Soft, NT, ND, bowel sounds + Extremities: no edema, no cyanosis    The results of significant diagnostics from this hospitalization (including imaging, microbiology, ancillary and laboratory) are listed below for reference.     Microbiology: Recent Results (from the past 240 hours)  Gastrointestinal Panel by PCR , Stool     Status: None   Collection Time: 06/20/24 10:00 AM   Specimen: Stool  Result Value Ref Range Status   Campylobacter species NOT DETECTED NOT DETECTED Final   Plesimonas shigelloides NOT DETECTED NOT DETECTED Final   Salmonella species NOT DETECTED NOT DETECTED Final   Yersinia enterocolitica NOT DETECTED NOT DETECTED Final   Vibrio species NOT DETECTED NOT DETECTED Final   Vibrio cholerae NOT DETECTED NOT DETECTED Final   Enteroaggregative E coli (EAEC) NOT DETECTED NOT DETECTED Final   Enteropathogenic E coli (EPEC) NOT DETECTED NOT DETECTED Final   Enterotoxigenic E coli (ETEC) NOT DETECTED NOT DETECTED Final   Shiga like toxin producing E coli (STEC) NOT DETECTED NOT DETECTED Final   Shigella/Enteroinvasive E coli (EIEC) NOT DETECTED NOT DETECTED Final   Cryptosporidium NOT DETECTED NOT DETECTED Final   Cyclospora cayetanensis NOT DETECTED NOT DETECTED Final   Entamoeba histolytica NOT DETECTED NOT DETECTED Final   Giardia lamblia NOT DETECTED NOT DETECTED Final   Adenovirus F40/41 NOT DETECTED NOT DETECTED Final   Astrovirus NOT DETECTED NOT DETECTED Final    Norovirus GI/GII NOT DETECTED NOT DETECTED Final   Rotavirus A NOT DETECTED NOT DETECTED Final   Sapovirus (I, II, IV,  and V) NOT DETECTED NOT DETECTED Final    Comment: Performed at [facility], [address] [place], [place] [address]     Labs: BNP (last 3 results) No results for input(s): BNP in the last 8760 hours. Basic Metabolic Panel: Recent Labs  Lab 06/18/24 0538 06/18/24 0900 06/18/24 1539 06/19/24 0408 06/19/24 0739 06/20/24 0341 06/21/24 0318 06/22/24 0353  NA 120*   < > 121*  --  124* 128* 129* 130*  K 2.5*   < > 3.5  --  3.8 3.6 3.6 3.8  CL 84*   < > 87*  --  90* 94* 95* 97*  CO2 22   < > 22  --  23 23 22 24   GLUCOSE 119*   < > 152*  --  123* 144* 112* 98  BUN 83*   < > 76*  --  74* 71* 64* 56*  CREATININE 2.68*   < > 2.43*  --  2.27* 2.28* 1.98* 1.61*  CALCIUM 8.4*   < > 9.0  --  8.9 8.5* 9.0 9.0  MG 2.6*  --   --  2.6*  --  2.7* 2.8* 2.9*   < > = values in this interval not displayed.   Liver Function Tests: Recent Labs  Lab 06/17/24 1926 06/19/24 0739 06/20/24 0341 06/21/24 0318 06/22/24 0353  AST 20 15 17  11* 12*  ALT 16 13 12 11 11   ALKPHOS 70 59 51 52 50  BILITOT 0.5 0.5 0.3 0.4 0.3  PROT 6.8 5.6* 5.3* 5.4* 5.6*  ALBUMIN 4.2 3.5 3.2* 3.3* 3.3*   No results for input(s): LIPASE, AMYLASE in the last 168 hours. No results for input(s): AMMONIA in the last 168 hours. CBC: Recent Labs  Lab 06/17/24 1926 06/18/24 0538 06/19/24 0408 06/20/24 0341 06/21/24 0318 06/22/24 0353  WBC 9.0 6.6 10.2 9.0 9.9 10.1  NEUTROABS 6.4  --  7.6 6.2 7.0 7.0  HGB 10.5* 8.8* 9.0* 8.1* 8.5* 8.6*  HCT 30.5* 25.5* 27.5* 24.6* 26.9* 26.5*  MCV 87.9 89.8 93.2 94.6 96.4 97.8  PLT 301 237 250 214 227 240   Cardiac Enzymes: No results for input(s): CKTOTAL, CKMB, CKMBINDEX, TROPONINI in the last 168 hours. BNP: Invalid input(s): POCBNP CBG: Recent Labs  Lab 06/19/24 0406  GLUCAP 146*   D-Dimer No results for input(s): DDIMER  in the last 72 hours. Hgb A1c No results for input(s): HGBA1C in the last 72 hours. Lipid Profile No results for input(s): CHOL, HDL, LDLCALC, TRIG, CHOLHDL, LDLDIRECT in the last 72 hours. Thyroid  function studies No results for input(s): TSH, T4TOTAL, T3FREE, THYROIDAB in the last 72 hours.  Invalid input(s): FREET3 Anemia work up No results for input(s): VITAMINB12, FOLATE, FERRITIN, TIBC, IRON, RETICCTPCT in the last 72 hours. Urinalysis    Component Value Date/Time   COLORURINE STRAW (A) 06/17/2024 2249   APPEARANCEUR CLEAR 06/17/2024 2249   APPEARANCEUR Clear 02/19/2024 0000   LABSPEC 1.008 06/17/2024 2249   PHURINE 6.0 06/17/2024 2249   GLUCOSEU NEGATIVE 06/17/2024 2249   GLUCOSEU NEGATIVE 06/01/2024 1530   HGBUR NEGATIVE 06/17/2024 2249   BILIRUBINUR NEGATIVE 06/17/2024 2249   BILIRUBINUR Negative 02/19/2024 0000   KETONESUR NEGATIVE 06/17/2024 2249   PROTEINUR NEGATIVE 06/17/2024 2249   UROBILINOGEN 0.2 06/01/2024 1530   NITRITE NEGATIVE 06/17/2024 2249   LEUKOCYTESUR NEGATIVE 06/17/2024 2249   Sepsis Labs Recent Labs  Lab 06/19/24 0408 06/20/24 0341 06/21/24 0318 06/22/24 0353  WBC 10.2 9.0 9.9 10.1   Microbiology Recent Results (  from the past 240 hours)  Gastrointestinal Panel by PCR , Stool     Status: None   Collection Time: 06/20/24 10:00 AM   Specimen: Stool  Result Value Ref Range Status   Campylobacter species NOT DETECTED NOT DETECTED Final   Plesimonas shigelloides NOT DETECTED NOT DETECTED Final   Salmonella species NOT DETECTED NOT DETECTED Final   Yersinia enterocolitica NOT DETECTED NOT DETECTED Final   Vibrio species NOT DETECTED NOT DETECTED Final   Vibrio cholerae NOT DETECTED NOT DETECTED Final   Enteroaggregative E coli (EAEC) NOT DETECTED NOT DETECTED Final   Enteropathogenic E coli (EPEC) NOT DETECTED NOT DETECTED Final   Enterotoxigenic E coli (ETEC) NOT DETECTED NOT DETECTED Final   Shiga like  toxin producing E coli (STEC) NOT DETECTED NOT DETECTED Final   Shigella/Enteroinvasive E coli (EIEC) NOT DETECTED NOT DETECTED Final   Cryptosporidium NOT DETECTED NOT DETECTED Final   Cyclospora cayetanensis NOT DETECTED NOT DETECTED Final   Entamoeba histolytica NOT DETECTED NOT DETECTED Final   Giardia lamblia NOT DETECTED NOT DETECTED Final   Adenovirus F40/41 NOT DETECTED NOT DETECTED Final   Astrovirus NOT DETECTED NOT DETECTED Final   Norovirus GI/GII NOT DETECTED NOT DETECTED Final   Rotavirus A NOT DETECTED NOT DETECTED Final   Sapovirus (I, II, IV, and V) NOT DETECTED NOT DETECTED Final    Comment: Performed at [facility], [address] [place], [place] [address]     Time coordinating discharge: Over 30 minutes  SIGNED:   Elsie JAYSON Mont[place], DO [facility] 06/22/2024, 1:57 PM Pager   If 7PM-7AM, please contact night-coverage [email or web address]

## 2024-06-22 NOTE — Plan of Care (Signed)

## 2024-06-22 NOTE — Plan of Care (Signed)
  Problem: Clinical Measurements: Goal: Diagnostic test results will improve Outcome: Progressing   Problem: Clinical Measurements: Goal: Respiratory complications will improve Outcome: Progressing   Problem: Clinical Measurements: Goal: Cardiovascular complication will be avoided Outcome: Progressing   Problem: Coping: Goal: Level of anxiety will decrease Outcome: Progressing   Problem: Elimination: Goal: Will not experience complications related to bowel motility Outcome: Progressing   Problem: Elimination: Goal: Will not experience complications related to urinary retention Outcome: Progressing

## 2024-06-23 ENCOUNTER — Telehealth: Payer: Self-pay | Admitting: *Deleted

## 2024-06-23 NOTE — Transitions of Care (Post Inpatient/ED Visit) (Signed)
   06/23/2024  Name: WINSTON SOBCZYK MRN: [id number] DOB: 06-19-1941  Today's TOC FU Call Status: Today's TOC FU Call Status:: Unsuccessful Call (1st Attempt) Unsuccessful Call (1st Attempt) Date: 06/23/24  Attempted to reach the patient regarding the most recent Inpatient/ED visit.  Follow Up Plan: Additional outreach attempts will be made to reach the patient to complete the Transitions of Care (Post Inpatient/ED visit) call.   Mliss Creed [facility], BSN RN Care Manager/ Transition of Care / [facility] [phone]

## 2024-06-24 ENCOUNTER — Telehealth: Payer: Self-pay | Admitting: Cardiology

## 2024-06-24 ENCOUNTER — Telehealth: Payer: Self-pay | Admitting: *Deleted

## 2024-06-24 DIAGNOSIS — N183 Chronic kidney disease, stage 3 unspecified: Secondary | ICD-10-CM

## 2024-06-24 DIAGNOSIS — R899 Unspecified abnormal finding in specimens from other organs, systems and tissues: Secondary | ICD-10-CM

## 2024-06-24 NOTE — Telephone Encounter (Signed)
 Pt requesting callback from [facility] in regards to medication management her recent hospital visit. Please

## 2024-06-24 NOTE — Telephone Encounter (Signed)
 Spoke to patient will need to discuss with Dr harding   Patient wanted Dr Anner to review discharge instruction = per patient  hospital wanted her decrease dosage Eliquis  and change or stop some meds.  Patient wanted Dr Anner to way in before she change any medications.  Forward for review  Patient is aware

## 2024-06-24 NOTE — Telephone Encounter (Signed)
 Just fyi pt had an appt 11/14 that had to be reschedule.  Pt just got out of the [place] and she wants to see Anner only.  She would like a call back from [facility] number [phone]

## 2024-06-24 NOTE — Transitions of Care (Post Inpatient/ED Visit) (Signed)
 06/24/2024  Name: [name] MRN: [id number] DOB: 1941/02/11  Today's TOC FU Call Status: Today's TOC FU Call Status:: Successful TOC FU Call Completed TOC FU Call Complete Date: 06/24/24 Patient's Name and Date of Birth confirmed.  Transition Care Management Follow-up Telephone Call Date of Discharge: 06/22/24 Discharge Facility: [facility]) Type of Discharge: Inpatient Admission Primary Inpatient Discharge Diagnosis:: hyponatremia How have you been since you were released from the hospital?:  (eating, drinking well, ambulating without difficulty, no diarrhea reported) Any questions or concerns?: No  Items Reviewed: Did you receive and understand the discharge instructions provided?: Yes Medications obtained,verified, and reconciled?: Yes (Medications Reviewed) Any new allergies since your discharge?: No Dietary orders reviewed?: Yes Type of Diet Ordered:: pt states she is eating low carb diet, heart healthy Do you have support at home?: Yes People in Home [RPT]: spouse Name of Support/Comfort Primary Source: Carlin Lee  Medications Reviewed Today: Medications Reviewed Today     Reviewed by Aura Mliss LABOR, RN (Registered Nurse) on 06/24/24 at 1200  Med List Status: <None>   Medication Order Taking? Sig Documenting Provider Last Dose Status Informant  allopurinol  (ZYLOPRIM ) 100 MG tablet [id number] Yes TAKE 1 TABLET BY MOUTH EVERY DAY Katrinka Garnette KIDD, MD  Active Self, Pharmacy Records  amiodarone  (PACERONE ) 200 MG tablet [id number] Yes TAKE 400 MG (2 TABLETS) ONE DOSE AS NEED FOR A FAST HEART RATE ,IF AFTER 12 HOURS STILL PRESENT TAKE 200 MG ( 1 TABLET) ONE DOSE FOR EACH EPISODE Anner Alm ORN, MD  Active Self, Pharmacy Records  amoxicillin-clavulanate (AUGMENTIN) 875-125 MG tablet [id number] Yes Take 1 tablet by mouth 2 (two) times daily for 2 days. Lue Elsie BROCKS, MD  Active   apixaban  (ELIQUIS ) 2.5 MG TABS tablet [id number] Yes Take 1 tablet (2.5 mg total)  by mouth 2 (two) times daily. Lue Elsie BROCKS, MD  Active   Artificial Tear Solution (TEARS RENEWED OP) [id number] Yes Apply 1 drop to eye daily as needed (dry eyes). [provider]  Active Self, Pharmacy Records  bismuth  subsalicylate (PEPTO BISMOL) 262 MG/15ML suspension [id number] Yes Take 30 mLs by mouth every 6 (six) hours as needed for diarrhea or loose stools. [provider]  Active Self, Pharmacy Records  carvedilol  (COREG ) 3.125 MG tablet [id number] Yes Take 1 tablet (3.125 mg total) by mouth 2 (two) times daily. Lue Elsie BROCKS, MD  Active   clopidogrel  (PLAVIX ) 75 MG tablet [id number] Yes Take 1 tablet (75 mg total) by mouth daily with breakfast. Henry Manuelita NOVAK, NP  Active Self, Pharmacy Records  colchicine 0.6 MG tablet [id number] Yes Take 0.6 mg by mouth daily as needed (Gout). [provider]  Active Self, Pharmacy Records           Med Note CHRISTIE ALEXANDER   Fri Apr 16, 2024 12:17 PM)    cyanocobalamin  (VITAMIN B12) 1000 MCG tablet [id number] Yes Take 1,000 mcg by mouth daily. [provider]  Active Self, Pharmacy Records  diclofenac sodium (VOLTAREN) 1 % GEL [id number] Yes Apply 2 g topically 4 (four) times daily as needed (pain). [provider]  Active Self, Pharmacy Records  furosemide  (LASIX ) 80 MG tablet [id number] Yes Take 1 tablet (80 mg total) by mouth 2 (two) times daily. May take a additional if weight is 3 lbs or more with daily weight  Patient taking differently: Take 80 mg by mouth in the morning, at noon, and at bedtime. May take a additional if weight is 3 lbs or  more with daily weight   Anner Alm ORN, MD  Active Self, Pharmacy Records  HYDROcodone -acetaminophen  (NORCO/VICODIN) 5-325 MG tablet [id number] Yes Take 1 tablet by mouth every 6 (six) hours as needed for moderate pain (pain score 4-6). Katrinka Garnette KIDD, MD  Active Self, Pharmacy Records  hyoscyamine  (ANASPAZ ) 0.125 MG TBDP disintergrating tablet  [id number]  Place 1 tablet (0.125 mg total) under the tongue every 6 (six) hours as needed (abdominal cramping).  Patient not taking: Reported on 06/24/2024   Katrinka Garnette KIDD, MD  Active Self, Pharmacy Records  indapamide  (LOZOL ) 1.25 MG tablet [id number] Yes Take 1 tablet (1.25 mg total) by mouth daily. Lue Elsie BROCKS, MD  Active   isosorbide  mononitrate (IMDUR ) 30 MG 24 hr tablet [id number] Yes Take 1 tablet (30 mg total) by mouth daily. May take an extra dose if you use Nitroglycerin   sublingual tablet for 2 days afterwards Lue Elsie BROCKS, MD  Active   lactobacillus (FLORANEX/LACTINEX) PACK [id number]  Take 1 packet (1 g total) by mouth 3 (three) times daily with meals.  Patient not taking: Reported on 06/24/2024   Lue Elsie BROCKS, MD  Active   lidocaine  (XYLOCAINE ) 5 % ointment [id number] Yes Apply 1 application. topically as needed. Darra Fonda MATSU, MD  Active Self, Pharmacy Records  loperamide (IMODIUM) 2 MG capsule [id number]  Take 2 capsules (4 mg total) by mouth as needed for diarrhea or loose stools.  Patient not taking: Reported on 06/24/2024   Lue Elsie BROCKS, MD  Active   nitroGLYCERIN  (NITROSTAT ) 0.4 MG SL tablet [id number] Yes DISSOLVE 1 TABLET UNDER THE TONGUE EVERY 5 MINUTES AS NEEDED FOR CHEST PAIN Henry Manuelita NOVAK, NP  Active Self, Pharmacy Records  nystatin  (MYCOSTATIN /NYSTOP ) powder [id number] Yes Apply 1 Application topically 3 (three) times daily. Timmy Maude SAUNDERS, MD  Active Self, Pharmacy Records  ondansetron  (ZOFRAN ) 4 MG tablet [id number] Yes Take 1 tablet (4 mg total) by mouth daily as needed for nausea or vomiting. Lue Elsie BROCKS, MD  Active             Home Care and Equipment/Supplies: Were Home Health Services Ordered?: No Any new equipment or medical supplies ordered?: No  Functional Questionnaire: Do you need assistance with bathing/showering or dressing?: No Do you need assistance with meal preparation?: No Do you need assistance  with eating?: No Do you have difficulty maintaining continence: No Do you need assistance with getting out of bed/getting out of a chair/moving?: No Do you have difficulty managing or taking your medications?: No  Follow up appointments reviewed: PCP Follow-up appointment confirmed?:  (pt has lab visit on 10/24 for UA at primary care provider, pt declines making sooner post hospital follow up appointment) Date of PCP follow-up appointment?: 07/13/24 Follow-up Provider: Dr. Garnette Katrinka Specialist [facility] Follow-up appointment confirmed?: Yes Date of Specialist follow-up appointment?: 07/16/24 Follow-Up Specialty Provider:: cardiologist Do you need transportation to your follow-up appointment?: No Do you understand care options if your condition(s) worsen?: Yes-patient verbalized understanding  SDOH Interventions Today    Flowsheet Row Most Recent Value  SDOH Interventions   Food Insecurity Interventions Intervention Not Indicated  Housing Interventions Intervention Not Indicated  Transportation Interventions Intervention Not Indicated  Utilities Interventions Intervention Not Indicated    Mliss Creed [facility], BSN RN Care Manager/ Transition of Care [place]/ [facility] [phone]

## 2024-06-24 NOTE — Telephone Encounter (Signed)
 So basically so the meds were held because when she initially presented to the hospital with diarrhea and dehydration, she had renal failure which has subsequently significant improved.  Losartan , spironolactone  were held and we can restart them as her renal function stabilizes.  Similarly, Eliquis  was adjusted based on the change in kidney function.  For now until we can reassess and see the kidney function is stable at the improved level, the reduced dose of 2.5 mg twice a day is the appropriate dose for her having just had significant kidney injury because the kidneys are needed to help clear out the medicine after is metabolized.  We pretty much would need to make sure that she has a chemistry panel checked within a couple weeks of her hospitalization so we can determine whether we can go back to her regular doses.   Alm Clay, MD

## 2024-06-25 ENCOUNTER — Other Ambulatory Visit

## 2024-06-25 DIAGNOSIS — R310 Gross hematuria: Secondary | ICD-10-CM

## 2024-06-25 DIAGNOSIS — R58 Hemorrhage, not elsewhere classified: Secondary | ICD-10-CM

## 2024-06-25 DIAGNOSIS — D649 Anemia, unspecified: Secondary | ICD-10-CM

## 2024-06-25 MED ORDER — APIXABAN 2.5 MG PO TABS: 2.5000 mg | ORAL_TABLET | Freq: Two times a day (BID) | ORAL | 0 refills | Status: DC

## 2024-06-25 NOTE — Telephone Encounter (Signed)
 Mr Narducci called and stated patient [name] is only having Urinalysis and fecal specimen ( if she is able to give one.)  RN informed patient husband once finish please come by the office to get samples of  2.5 mg Eliquis   until she has repeat lab work in one - two weeks    MR Engineer, production  verbalized understanding

## 2024-06-25 NOTE — Telephone Encounter (Signed)
 Patient's husband picked up samples. awraE TO TAKE PATIENT TO THE LAB WED OR THURSDAY OF NEXT WEEK   Will contact patient next week for an appt with Dr Anner

## 2024-06-25 NOTE — Telephone Encounter (Signed)
 Late entry  Spoke to patient . Informed her of Dr Anner comments to her message.  Patient states she will be going to [facility] [place] office for labs. RN aske patient and husband to call to inform RN of what labs that is drawn.   She verbalized understanding.

## 2024-06-26 LAB — URINALYSIS, ROUTINE W REFLEX MICROSCOPIC
Bilirubin Urine: NEGATIVE
Glucose, UA: NEGATIVE
Hgb urine dipstick: NEGATIVE
Ketones, ur: NEGATIVE
Leukocytes,Ua: NEGATIVE
Nitrite: NEGATIVE
Protein, ur: NEGATIVE
Specific Gravity, Urine: 1.011 (ref 1.001–1.035)
pH: 5.5 (ref 5.0–8.0)

## 2024-06-27 ENCOUNTER — Other Ambulatory Visit: Payer: Self-pay | Admitting: Cardiology

## 2024-06-28 ENCOUNTER — Ambulatory Visit: Payer: Self-pay | Admitting: Family Medicine

## 2024-06-28 DIAGNOSIS — R197 Diarrhea, unspecified: Secondary | ICD-10-CM

## 2024-06-30 DIAGNOSIS — R899 Unspecified abnormal finding in specimens from other organs, systems and tissues: Secondary | ICD-10-CM | POA: Diagnosis not present

## 2024-06-30 DIAGNOSIS — N183 Chronic kidney disease, stage 3 unspecified: Secondary | ICD-10-CM | POA: Diagnosis not present

## 2024-07-01 ENCOUNTER — Ambulatory Visit: Payer: Self-pay | Admitting: Cardiology

## 2024-07-01 LAB — COMPREHENSIVE METABOLIC PANEL WITH GFR
ALT: 9 IU/L (ref 0–32)
AST: 8 IU/L (ref 0–40)
Albumin: 3.7 g/dL (ref 3.7–4.7)
Alkaline Phosphatase: 72 IU/L (ref 48–129)
BUN/Creatinine Ratio: 23 (ref 12–28)
BUN: 37 mg/dL — ABNORMAL HIGH (ref 8–27)
Bilirubin Total: 0.4 mg/dL (ref 0.0–1.2)
CO2: 26 mmol/L (ref 20–29)
Calcium: 9.1 mg/dL (ref 8.7–10.3)
Chloride: 93 mmol/L — ABNORMAL LOW (ref 96–106)
Creatinine, Ser: 1.59 mg/dL — ABNORMAL HIGH (ref 0.57–1.00)
Globulin, Total: 2.5 g/dL (ref 1.5–4.5)
Glucose: 97 mg/dL (ref 70–99)
Potassium: 3.3 mmol/L — ABNORMAL LOW (ref 3.5–5.2)
Sodium: 136 mmol/L (ref 134–144)
Total Protein: 6.2 g/dL (ref 6.0–8.5)
eGFR: 32 mL/min/1.73 — ABNORMAL LOW (ref >59)

## 2024-07-05 NOTE — Telephone Encounter (Signed)
 Called left message tocall office if appt is suitable  07/14/24 at 10:40 am

## 2024-07-09 ENCOUNTER — Ambulatory Visit

## 2024-07-09 DIAGNOSIS — I480 Paroxysmal atrial fibrillation: Secondary | ICD-10-CM | POA: Diagnosis not present

## 2024-07-11 LAB — CUP PACEART REMOTE DEVICE CHECK
Battery Remaining Longevity: 145 mo
Battery Voltage: 3.02 V
Brady Statistic AP VP Percent: 0.06 %
Brady Statistic AP VS Percent: 16.37 %
Brady Statistic AS VP Percent: 0.04 %
Brady Statistic AS VS Percent: 83.53 %
Brady Statistic RA Percent Paced: 16.76 %
Brady Statistic RV Percent Paced: 0.1 %
Date Time Interrogation Session: 20251107162931
Implantable Lead Connection Status: 753985
Implantable Lead Connection Status: 753985
Implantable Lead Implant Date: 20221107
Implantable Lead Implant Date: 20221107
Implantable Lead Location: 753859
Implantable Lead Location: 753860
Implantable Lead Model: 5076
Implantable Lead Model: 5076
Implantable Pulse Generator Implant Date: 20221107
Lead Channel Impedance Value: 418 Ohm
Lead Channel Impedance Value: 475 Ohm
Lead Channel Impedance Value: 513 Ohm
Lead Channel Impedance Value: 532 Ohm
Lead Channel Pacing Threshold Amplitude: 0.75 V
Lead Channel Pacing Threshold Amplitude: 0.75 V
Lead Channel Pacing Threshold Pulse Width: 0.4 ms
Lead Channel Pacing Threshold Pulse Width: 0.4 ms
Lead Channel Sensing Intrinsic Amplitude: 10 mV
Lead Channel Sensing Intrinsic Amplitude: 10 mV
Lead Channel Sensing Intrinsic Amplitude: 3.25 mV
Lead Channel Sensing Intrinsic Amplitude: 3.25 mV
Lead Channel Setting Pacing Amplitude: 1.5 V
Lead Channel Setting Pacing Amplitude: 2 V
Lead Channel Setting Pacing Pulse Width: 0.4 ms
Lead Channel Setting Sensing Sensitivity: 1.2 mV
Zone Setting Status: 755011
Zone Setting Status: 755011

## 2024-07-13 ENCOUNTER — Ambulatory Visit: Admitting: Family Medicine

## 2024-07-13 ENCOUNTER — Ambulatory Visit: Payer: Self-pay | Admitting: Cardiovascular Disease

## 2024-07-13 ENCOUNTER — Encounter: Payer: Self-pay | Admitting: Family Medicine

## 2024-07-13 ENCOUNTER — Telehealth: Payer: Self-pay | Admitting: Internal Medicine

## 2024-07-13 VITALS — BP 118/68 | HR 89 | Temp 98.2°F | Ht 68.0 in | Wt 183.2 lb

## 2024-07-13 DIAGNOSIS — K639 Disease of intestine, unspecified: Secondary | ICD-10-CM | POA: Diagnosis not present

## 2024-07-13 DIAGNOSIS — I480 Paroxysmal atrial fibrillation: Secondary | ICD-10-CM

## 2024-07-13 DIAGNOSIS — E1142 Type 2 diabetes mellitus with diabetic polyneuropathy: Secondary | ICD-10-CM | POA: Diagnosis not present

## 2024-07-13 DIAGNOSIS — R0989 Other specified symptoms and signs involving the circulatory and respiratory systems: Secondary | ICD-10-CM

## 2024-07-13 DIAGNOSIS — I25119 Atherosclerotic heart disease of native coronary artery with unspecified angina pectoris: Secondary | ICD-10-CM | POA: Diagnosis not present

## 2024-07-13 DIAGNOSIS — K529 Noninfective gastroenteritis and colitis, unspecified: Secondary | ICD-10-CM

## 2024-07-13 DIAGNOSIS — I5030 Unspecified diastolic (congestive) heart failure: Secondary | ICD-10-CM | POA: Diagnosis not present

## 2024-07-13 DIAGNOSIS — I110 Hypertensive heart disease with heart failure: Secondary | ICD-10-CM | POA: Diagnosis not present

## 2024-07-13 MED ORDER — ONDANSETRON 4 MG PO TBDP: 4.0000 mg | ORAL_TABLET | Freq: Three times a day (TID) | ORAL | 1 refills | Status: DC | PRN

## 2024-07-13 NOTE — Telephone Encounter (Signed)
 [facility] is calling to find out if we can review the referral and work her in. Please reach out to patient

## 2024-07-13 NOTE — Telephone Encounter (Signed)
 Left message for pt to call back. Pt scheduled to see Nestor Blower PA 07/16/24 at 8:20am.

## 2024-07-13 NOTE — Patient Instructions (Addendum)
 Team is entering urgent gastroenterology referral let me know if you do not hear by end of weak  Try Zofran  before breakfast and dinner- oral dissolvable tablet. Also ok to do smaller more frequent meals  Please stop by lab before you go If you have mychart- we will send your results within 3 business days of us  receiving them.  If you do not have mychart- we will call you about results within 5 business days of us  receiving them.  *please also note that you will see labs on mychart as soon as they post. I will later go in and write notes on them- will say notes from Dr. Katrinka   Recommended follow up: Return in about 3 weeks (around 08/03/2024).

## 2024-07-13 NOTE — Progress Notes (Signed)
 Phone [phone] In person visit   Subjective:   [name] is a 83 y.o. year old very pleasant female patient who presents for/with See problem oriented charting Chief Complaint  Patient presents with   Medical Management of Chronic Issues    6 week follow up; patient states she's not feeling much better but has improved slightly; terrible pain in stomach every time after she eats; patient states she is passing globs of purulent fluid from her bowels pretty often even when not using the bathroom, she is wearing a pad to catch the fluid;    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    Priority: High   Osteoporosis 01/22/2022    Priority: High   Pacemaker 07/09/2021    Priority: High   Mobitz type 2 second degree atrioventricular block 06/20/2021    Priority: High   PAT (paroxysmal atrial tachycardia) 06/20/2021    Priority: High   PAF (paroxysmal atrial fibrillation) (HCC) 10/16/2020    Priority: High   PAD (peripheral artery disease) 01/14/2017    Priority: High   Type 2 diabetes mellitus with peripheral neuropathy (HCC) 10/09/2016    Priority: High   Mesenteric artery stenosis (HCC) 04/20/2014    Priority: High   Abdominal pain, epigastric 01/19/2014    Priority: High   Congestive heart failure with LV diastolic dysfunction, NYHA class 2 (HCC)     Priority: High   Celiac artery stenosis: 60% by Duplex - 90-95% by cath - Med Rx 11/27/2013    Priority: High   Renal artery stenosis 08/29/2012    Priority: High   Coronary artery disease involving native coronary artery of native heart with angina pectoris 10/30/2011    Priority: High   CAD S/P percutaneous coronary angioplasty: pRCA BMS (now with DES overlapping proximal and distal), mLAD BMS overlapped prox with DES for ISR 07/30/2010    Priority: High   B12 deficiency 01/10/2022    Priority: Medium    Continuous severe abdominal pain  10/11/2016    Priority: Medium    Exertional dyspnea 03/16/2015    Priority: Medium    Idiopathic chronic pancreatitis suspected 03/10/2014    Priority: Medium    Abnormal CT of liver-possible cirrhosis 03/10/2014    Priority: Medium    Stasis edema of bilateral lower extremity     Priority: Medium    Labile hypertension     Priority: Medium    Hyperlipidemia associated with type 2 diabetes mellitus (HCC)     Priority: Medium    Myalgia due to statin 12/29/2011    Priority: Medium    GERD (gastroesophageal reflux disease) 01/24/2009    Priority: Medium    Hypothyroid 11/12/2007    Priority: Medium    Irritable bowel syndrome 11/12/2007    Priority: Medium    PANCREAS DIVISUM 11/12/2007    Priority: Medium    Allergic rhinitis 01/10/2022    Priority: Low   Pneumothorax 07/10/2021    Priority: Low   Syncope and collapse 06/20/2021    Priority: Low   Hallux valgus (acquired), right foot 08/17/2018    Priority: Low   Costochondritis 01/21/2017    Priority: Low   Onychomycosis due to dermatophyte 11/16/2015    Priority: Low   Abnormal pharmacologic myocardial perfusion study 03/15/2015    Priority: Low   Dysphagia 03/10/2014    Priority: Low   Carotid stenosis 02/27/2014    Priority: Low  Leg cramps 01/19/2014    Priority: Low   Palpitations 01/12/2014    Priority: Low   Diarrhea due to malabsorption 01/24/2009    Priority: Low   Nephrolithiasis 11/12/2007    Priority: Low   HIATAL HERNIA 11/20/2005    Priority: Low   Hyponatremia 06/17/2024   Kyphoscoliosis and scoliosis 05/17/2024   Chronic kidney disease (CKD), stage III (moderate) (HCC) 05/17/2024   Atypical angina 04/02/2024   Gross hematuria 11/19/2023   Hypercoagulable state due to paroxysmal atrial fibrillation (HCC) 10/01/2023   Nodular fasciitis 12/20/2022    Medications- reviewed and updated Current Outpatient Medications  Medication Sig Dispense Refill   allopurinol  (ZYLOPRIM ) 100 MG tablet TAKE  1 TABLET BY MOUTH EVERY DAY 90 tablet 2   amiodarone  (PACERONE ) 200 MG tablet TAKE 400 MG (2 TABLETS) ONE DOSE AS NEED FOR A FAST HEART RATE ,IF AFTER 12 HOURS STILL PRESENT TAKE 200 MG ( 1 TABLET) ONE DOSE FOR EACH EPISODE 180 tablet 1   apixaban  (ELIQUIS ) 2.5 MG TABS tablet Take 1 tablet (2.5 mg total) by mouth 2 (two) times daily. 60 tablet 0   Artificial Tear Solution (TEARS RENEWED OP) Apply 1 drop to eye daily as needed (dry eyes).     bismuth  subsalicylate (PEPTO BISMOL) 262 MG/15ML suspension Take 30 mLs by mouth every 6 (six) hours as needed for diarrhea or loose stools.     carvedilol  (COREG ) 3.125 MG tablet Take 1 tablet (3.125 mg total) by mouth 2 (two) times daily. 180 tablet 0   clopidogrel  (PLAVIX ) 75 MG tablet Take 1 tablet (75 mg total) by mouth daily with breakfast. 90 tablet 1   colchicine 0.6 MG tablet Take 0.6 mg by mouth daily as needed (Gout).     cyanocobalamin  (VITAMIN B12) 1000 MCG tablet Take 1,000 mcg by mouth daily.     diclofenac sodium (VOLTAREN) 1 % GEL Apply 2 g topically 4 (four) times daily as needed (pain).     furosemide  (LASIX ) 80 MG tablet Take 1 tablet (80 mg total) by mouth 2 (two) times daily. May take a additional if weight is 3 lbs or more with daily weight (Patient taking differently: Take 80 mg by mouth daily. May take a additional if weight is 3 lbs or more with daily weight Pt states she takes 2 tablets in morning, 1 tablet in evening as previously prescribed by her cardiologist) 200 tablet 3   HYDROcodone -acetaminophen  (NORCO/VICODIN) 5-325 MG tablet Take 1 tablet by mouth every 6 (six) hours as needed for moderate pain (pain score 4-6). 30 tablet 0   hyoscyamine  (ANASPAZ ) 0.125 MG TBDP disintergrating tablet Place 1 tablet (0.125 mg total) under the tongue every 6 (six) hours as needed (abdominal cramping). 30 tablet 5   indapamide  (LOZOL ) 1.25 MG tablet Take 1 tablet (1.25 mg total) by mouth daily. 30 tablet 0   isosorbide  mononitrate (IMDUR ) 30 MG 24  hr tablet Take 1 tablet (30 mg total) by mouth daily. May take an extra dose if you use Nitroglycerin   sublingual tablet for 2 days afterwards 30 tablet 0   lidocaine  (XYLOCAINE ) 5 % ointment Apply 1 application. topically as needed. 35.44 g 0   nitroGLYCERIN  (NITROSTAT ) 0.4 MG SL tablet DISSOLVE 1 TABLET UNDER THE TONGUE EVERY 5 MINUTES AS NEEDED FOR CHEST PAIN 25 tablet 2   nystatin  (MYCOSTATIN /NYSTOP ) powder Apply 1 Application topically 3 (three) times daily. 30 g 2   ondansetron  (ZOFRAN -ODT) 4 MG disintegrating tablet Take 1 tablet (4 mg total) by mouth  every 8 (eight) hours as needed for nausea or vomiting. 30 tablet 1   lactobacillus (FLORANEX/LACTINEX) PACK Take 1 packet (1 g total) by mouth 3 (three) times daily with meals. (Patient not taking: Reported on 07/13/2024) 90 packet 0   loperamide (IMODIUM) 2 MG capsule Take 2 capsules (4 mg total) by mouth as needed for diarrhea or loose stools. (Patient not taking: Reported on 07/13/2024) 30 capsule 0   No current facility-administered medications for this visit.     Objective:  BP 118/68 (BP Location: Left Arm, Patient Position: Sitting, Cuff Size: Normal)   Pulse 89   Temp 98.2 F (36.8 C) (Temporal)   Ht 5' 8 (1.727 m)   Wt 183 lb 3.2 oz (83.1 kg)   SpO2 94%   BMI 27.86 kg/m  Gen: NAD, resting comfortably CV: RRR no murmurs rubs or gallops Lungs: CTAB no crackles, wheeze, rhonchi Lower abdominal pain with palpation new- similar upper abdominal pain to previous Ext: trace edema Skin: warm, dry     Assessment and Plan   # Hospital follow-up-unable to connect for transitions of care for hyponatremia S: Patient presented to the hospital on 06/17/2024 with 2 weeks of worsened diarrhea beyond her chronic baseline and abdominal pain worse than her typical which also led to poor intake as well as fatigue and weakness-there were concern for inflammatory versus reactive colitis.  They do not suspect infectious cause.  Given  supportive care as well as Imodium.  Initially treated with antibiotics for presumed infectious diarrhea but symptoms continued to improve despite stopping antibiotics-was initially on Zosyn.  GI pathogen panel was negative.  Unclear why but did not receive C. difficile testing-please order this for her.  CT scan showed nonspecific sigmoid wall thickening without inflammation.  She had recently completed antibiotics for outpatient urinary tract infection  Despite improvement in diarrhea she continues to have severe left-sided rib and epigastric pain-had considered radicular pain as well as possible vascular issues but we had deferred CT angiogram due to kidney function.  MRI of thoracic spine October 20was postponed due to acute illness.  Lipase levels were not elevated pointing away from pancreatitis  She was noted to have significant hyponatremiawhich slowly resolved with rehydration.  Also had hypokalemia related to diarrhea which was repleted. -He also had acute kidney injury from dehydration with creatinine up to 3.6 from her baseline of 1.7 before returning to 1.6 before discharge.  Her heart failure medications were held due to lower blood pressures and dehydration  In regards to her Plavix  this was continued after RCA stent earlier this year.  She was also continued on Eliquis  2.5 mg twice daily at reduced dose during hospitalization as well as carvedilol   -Appetite is severely low still- vomits with this. Eats small portion then does liquid gaviscon and can keep food down - Diarrhea resolved with kaopectate but then has had constipation -gets severe lower pain after eating each time- has been going on since getting ghome from hospital - passing globs of mucus from her rectum -chronic abdominal and rib pain no better -reports blood in stool with the diarrhea- prior hemocult was negative.   A/P: Severe diarrhea thankfully has resolved but now constipated-discussed a gentle regimen to reverse  this trend with MiraLAX half capful daily for now-do not want to reverse try to go back towards diarrhea -With resolution of diarrhea opts out of C. difficile testing which we are planning outpatient  -Concern for colitis and patient with sigmoid thickening on CT-ordered  urgent referral to GI especially with intermittent blood in the stool reported.  With her ongoing lower abdominal pain after meals and intermittent blood in the stools (Plavix  and Eliquis  certainly increases risk-she denies blood in the urine and this has been a concern previously as she noted some blood in the toilet) suspect she may need colonoscopy.  With her vascular history consider evaluation for ischemic colitis as well-previously considered CT angio abdomen with and without contrast but we ultimately opted out due to worsening kidney function-kidney function has improved and that would be a possibility although GFR is right around 30 unless improves today.  We could refer her back to vascular with prior intervention needed for mesenteric artery stenosis -We also discussed potential for malignancy- IBD concern as well- used to see Dr. Albertus and good connection with Jessica Zehrn PA as well -she does have primary myxofibrosarcoma in the leg but doubt metastasis to this area -Reports intermittent blood in the stool  For hyponatremia update CMP today-hopefully stable.  Magnesium  was high in the hospital likely with repletion-update today  With ongoing nausea and vomiting-change ondansetron  to oral dissolvable tablet and discussed could take twice a day every 8 hours  Also ongoing abdominal pain in upper abdomen and ribs-she needs to reschedule the MRI of her thoracic spine without contrast   #CAD with angina- on imdur - despite PCI x2 (last 2025-Underwent outpatient cardiac catheterization 8/19 with Dr. Anner noted above with two-vessel CAD including the RCA and LAD with widely patent mid LAD stents.) LPA not elevated.   #hyperlipidemia but with myalgias related to statins S: Medication: imdur  30 mg, coreg  3.125 mg BID, on eliquis  2.5 mg  BID  - Back on Plavix  after stent - no cholesterol treatment as of 2023-statin intolerant and failed other lipid management - has not tried pcsk9 inhibitors-she has not been interested  A/P: CAD appears asymptomatic with stent in place-continue Plavix .  Lipids poorly controlled off statin but she firmly declines this as well as medication like Praluent and Repatha-continue to monitor -Certainly has increased bleeding risk on combination of Plavix  and Eliquis   # Atrial fibrillation- discovered by pacemaker interrogation (very symptomatic) S: Rate controlled on coreg  6.25 mg BID (extra tablet with palpitations) - on amiodarone  400 mg with fast HR, additional 200 12 hours later if persists per rx Anticoagulated with eliquis  2.5 mg BID A/P: appropriately anticoagulated and rate controlled- continue current medicine    # Diabetes S: Medication:Diet controlled Lab Results  Component Value Date   HGBA1C 6.2 06/01/2024   HGBA1C 6.2 02/19/2024   HGBA1C 5.9 (A) 11/13/2023  A/P: Diabetes has been well-controlled without medicine-continue to monitor   #CHF diastolic, NYHA class III #hypertension S: Medication:  lasix  80 mg (BID if needed for edema) once a day lately, imdur  30 mg, carvedilol  3.125 mg twice daily -Currently on hold losartan  50 mg, spironolactone  25 mg  Edema: Denies increased Weight gain: Actually down 7 pounds with low appetite A/P: CHF appears euvolemic-continue current medication.  Blood pressure well-controlled still without losartan  spironolactone -may be able to restart these in the future   Recommended follow up: Return in about 3 weeks (around 08/03/2024). Future Appointments  Date Time Provider Department Center  07/14/2024 10:40 AM Anner Alm ORN, MD [facility]  07/16/2024  8:20 AM Mollie Nestor HERO, PA-C [facility]  08/13/2024  3:20  PM Katrinka Garnette KIDD, MD [facility]  08/31/2024  8:40 AM [place], PA-C [facility]  10/08/2024  7:05 AM [facility]  DEVICE REMOTES [facility]  01/07/2025  7:05 AM [facility] DEVICE REMOTES [facility]  04/08/2025  7:05 AM [facility] DEVICE REMOTES [facility]  05/31/2025  3:40 PM [facility] ANNUAL WELLNESS VISIT 1 [facility] [place]  07/08/2025  7:05 AM [facility] DEVICE REMOTES [facility]    Lab/Order associations:   ICD-10-CM   1. Colitis  K52.9 Ambulatory referral to Gastroenterology    2. Sigmoid thickening  K63.9 Ambulatory referral to Gastroenterology    3. High magnesium  levels  E83.41 Magnesium     4. Labile hypertension  R09.89 Comprehensive metabolic panel with GFR    CBC with Differential/Platelet    5. Type 2 diabetes mellitus with peripheral neuropathy (HCC)  E11.42     6. PAF (paroxysmal atrial fibrillation) (HCC)  I48.0     7. Coronary artery disease involving native coronary artery of native heart with angina pectoris  I25.119       Meds ordered this encounter  Medications   ondansetron  (ZOFRAN -ODT) 4 MG disintegrating tablet    Sig: Take 1 tablet (4 mg total) by mouth every 8 (eight) hours as needed for nausea or vomiting.    Dispense:  30 tablet    Refill:  1    Return precautions advised.  Garnette Lukes, MD

## 2024-07-13 NOTE — Progress Notes (Signed)
 Remote PPM Transmission

## 2024-07-14 ENCOUNTER — Ambulatory Visit: Payer: Self-pay | Admitting: Family Medicine

## 2024-07-14 ENCOUNTER — Encounter

## 2024-07-14 ENCOUNTER — Encounter: Payer: Self-pay | Admitting: Cardiology

## 2024-07-14 ENCOUNTER — Ambulatory Visit: Attending: Cardiology | Admitting: Cardiology

## 2024-07-14 VITALS — BP 118/66 | HR 95 | Ht 67.0 in | Wt 181.0 lb

## 2024-07-14 DIAGNOSIS — I25119 Atherosclerotic heart disease of native coronary artery with unspecified angina pectoris: Secondary | ICD-10-CM | POA: Insufficient documentation

## 2024-07-14 DIAGNOSIS — D6869 Other thrombophilia: Secondary | ICD-10-CM | POA: Diagnosis not present

## 2024-07-14 DIAGNOSIS — Z9861 Coronary angioplasty status: Secondary | ICD-10-CM | POA: Diagnosis not present

## 2024-07-14 DIAGNOSIS — R0989 Other specified symptoms and signs involving the circulatory and respiratory systems: Secondary | ICD-10-CM | POA: Insufficient documentation

## 2024-07-14 DIAGNOSIS — I4719 Other supraventricular tachycardia: Secondary | ICD-10-CM | POA: Insufficient documentation

## 2024-07-14 DIAGNOSIS — I441 Atrioventricular block, second degree: Secondary | ICD-10-CM | POA: Diagnosis not present

## 2024-07-14 DIAGNOSIS — I480 Paroxysmal atrial fibrillation: Secondary | ICD-10-CM | POA: Diagnosis not present

## 2024-07-14 DIAGNOSIS — I2510 Atherosclerotic heart disease of native coronary artery without angina pectoris: Secondary | ICD-10-CM | POA: Insufficient documentation

## 2024-07-14 DIAGNOSIS — I5030 Unspecified diastolic (congestive) heart failure: Secondary | ICD-10-CM | POA: Insufficient documentation

## 2024-07-14 DIAGNOSIS — R42 Dizziness and giddiness: Secondary | ICD-10-CM | POA: Insufficient documentation

## 2024-07-14 DIAGNOSIS — N183 Chronic kidney disease, stage 3 unspecified: Secondary | ICD-10-CM | POA: Diagnosis not present

## 2024-07-14 DIAGNOSIS — R109 Unspecified abdominal pain: Secondary | ICD-10-CM | POA: Insufficient documentation

## 2024-07-14 LAB — COMPREHENSIVE METABOLIC PANEL WITH GFR
ALT: 10 U/L (ref 0–35)
AST: 12 U/L (ref 0–37)
Albumin: 3.7 g/dL (ref 3.5–5.2)
Alkaline Phosphatase: 70 U/L (ref 39–117)
BUN: 24 mg/dL — ABNORMAL HIGH (ref 6–23)
CO2: 31 meq/L (ref 19–32)
Calcium: 9.1 mg/dL (ref 8.4–10.5)
Chloride: 90 meq/L — ABNORMAL LOW (ref 96–112)
Creatinine, Ser: 0.99 mg/dL (ref 0.40–1.20)
GFR: 53.18 mL/min — ABNORMAL LOW (ref >60.00)
Glucose, Bld: 95 mg/dL (ref 70–99)
Potassium: 3.7 meq/L (ref 3.5–5.1)
Sodium: 131 meq/L — ABNORMAL LOW (ref 135–145)
Total Bilirubin: 0.6 mg/dL (ref 0.2–1.2)
Total Protein: 6.8 g/dL (ref 6.0–8.3)

## 2024-07-14 LAB — MAGNESIUM: Magnesium: 2.3 mg/dL (ref 1.5–2.5)

## 2024-07-14 LAB — CBC WITH DIFFERENTIAL/PLATELET
Basophils Absolute: 0.1 K/uL (ref 0.0–0.1)
Basophils Relative: 1 % (ref 0.0–3.0)
Eosinophils Absolute: 0 K/uL (ref 0.0–0.7)
Eosinophils Relative: 0.4 % (ref 0.0–5.0)
HCT: 36.5 % (ref 36.0–46.0)
Hemoglobin: 12.1 g/dL (ref 12.0–15.0)
Lymphocytes Relative: 12.9 % (ref 12.0–46.0)
Lymphs Abs: 1.4 K/uL (ref 0.7–4.0)
MCHC: 33.1 g/dL (ref 30.0–36.0)
MCV: 93.2 fl (ref 78.0–100.0)
Monocytes Absolute: 1.3 K/uL — ABNORMAL HIGH (ref 0.1–1.0)
Monocytes Relative: 11.9 % (ref 3.0–12.0)
Neutro Abs: 8 K/uL — ABNORMAL HIGH (ref 1.4–7.7)
Neutrophils Relative %: 73.8 % (ref 43.0–77.0)
Platelets: 373 K/uL (ref 150.0–400.0)
RBC: 3.92 Mil/uL (ref 3.87–5.11)
RDW: 15.8 % — ABNORMAL HIGH (ref 11.5–15.5)
WBC: 10.9 K/uL — ABNORMAL HIGH (ref 4.0–10.5)

## 2024-07-14 MED ORDER — APIXABAN 5 MG PO TABS: 5.0000 mg | ORAL_TABLET | Freq: Two times a day (BID) | ORAL | Status: AC

## 2024-07-14 MED ORDER — BISOPROLOL FUMARATE 5 MG PO TABS: 2.5000 mg | ORAL_TABLET | Freq: Every day | ORAL | 3 refills | Status: AC

## 2024-07-14 NOTE — Assessment & Plan Note (Signed)
 Short bursts of atrial fibs or flutter noted on pacer interrogation.  Longest episodes have been 3 to 5 minutes. She has been on a beta-blocker so we are converting from carvedilol  to bisoprolol for less BP effect. - Continue Eliquis  for anticoagulation. - Monitor for significant heart rhythm changes.

## 2024-07-14 NOTE — Assessment & Plan Note (Signed)
 Blood pressure now low for her.  In the normal range overall, but I think we can back down on her beta-blocker dose by switching from carvedilol  to bisoprolol 2.5 mg daily.  Holding off on restarting ARB for now, but would probably restart ARB for or converting back to carvedilol .

## 2024-07-14 NOTE — Assessment & Plan Note (Signed)
 Dizziness likely due to low blood pressure and recent illness. Managed by adjusting antihypertensive medications. - Switched from carvedilol  3.125 mg twice daily to bisoprolol 2.5 mg daily to manage blood pressure and heart rate without excessive hypotension. - Monitor blood pressure regularly.

## 2024-07-14 NOTE — Patient Instructions (Addendum)
 Medication Instructions:   Restart Eliquis   5mg  twice a day   Stop taking Imdur   and Carvedilol    Start Bisoprolol 2.5 mg  daily  *If you need a refill on your cardiac medications before your next appointment, please call your pharmacy*   Lab Work:  Not needed   Testing/Procedures: Not needed   Follow-Up: At [facility], you and your health needs are our priority.  As part of our continuing mission to provide you with exceptional heart care, we have created designated Provider Care Teams.  These Care Teams include your primary Cardiologist (physician) and Advanced Practice Providers (APPs -  Physician Assistants and Nurse Practitioners) who all work together to provide you with the care you need, when you need it.     Your next appointment:   4 month(s)  The format for your next appointment:   In Person  Provider:   Alm Clay, MD  We will call you on Jan 16  @ around a 10:40 am

## 2024-07-14 NOTE — Assessment & Plan Note (Signed)
 Continue beta-blocker.  We are converting from carvedilol  to bisoprolol.

## 2024-07-14 NOTE — Assessment & Plan Note (Addendum)
 Recent stent placement in two areas. Echocardiogram and catheterization show no significant blockages. Previous stents well-managed. Symptoms likely related to recent illness. - Continue Plavix  for six months post-stent placement. => Would be ~ October 21 2024 for her to complete her course.  Would be okay to hold for urgent procedures or surgeries as of 07/21/2024

## 2024-07-14 NOTE — Progress Notes (Signed)
 Cardiology Office Note:  .   Date:  07/14/2024  ID:  [name], DOB [birth year], MRN [id number] PCP: Katrinka Garnette KIDD, MD  [facility] Providers Cardiologist:  Alm Clay, MD Electrophysiologist:  Jerel Balding, MD     Chief Complaint  Patient presents with   Hospitalization Follow-up    Here to discuss medications.   Hypertension    Blood pressure has been somewhat low.  Now noticing worsening dizziness.   Coronary Artery Disease    Actually not really having much angina.  Occasional chest pain but not like she usually has.    Patient Profile: .     [name] is a very pleasant 83 y.o. female with a PMH noted below who presents here for hospital f/u at the request of Katrinka Garnette KIDD, MD.  CV History  CAD - 07/2010 - Progressive Angina: BMS PCI RCA & LAD (pre-op Back Sgx)  03/2011 Unstable Angina => LAD proximal ISR -> Overlapping DES w/ PTCA of jailed D2 02/2012: Patent Stents -> 08/2012 (peri-Op MI) patent stents Most recent cath was July 2016: Widely patent LAD stents with D2 jailed.  Minimal D2 stenosis.  Proximal to mid RCA BMS with less than 10% ISR.  OM1 and D1 60% stenosis.-Stable.  Myoview  May 2021: No ischemia or infarct.  LOW RISK => has chronic intermittent severe chest pain which she calls angina Chronic HFpEF Chronic bilateral LE edema Celiac Artery Stenosis - s/p Stent (2018) Bilateral Renal Atery Stenosis -> s/p L RA Stent (2011) Paroxysmal atrial tachycardia -> last anywhere from 30 seconds to 10 minutes. Symptomatic bradycardia with sinus pauses, Mobitz 2 AVB => s/p dual-chamber PPM Paroxysmal atrial fibrillation -> has short bursts lasting just a few hours. HLD-statin myopathy.  Unwilling to try any cholesterol medication      RIANN OMAN was last seen on May 17, 2024 post cath after PCI to RCA (2 lesions pre and post the previous stent).  She was continued to have dyspnea but not to the same extent that she had prior to the  stent placement.  Some chest pain but also less severe.  Symptoms felt to be related to some diastolic dysfunction.  I decided to reduce the Imdur  dose to 30 mg and reduce to carvedilol  down to 6.25 mg twice daily while continuing current dose of losartan  50 mg daily.  The plan was for 6 months of Plavix  post PCI double therapy with Eliquis  and then stopping after that.  Subjective  Discussed the use of AI scribe software for clinical note transcription with the patient, who gave verbal consent to proceed.  History of Present Illness [name] is an 83 year old female with hypertension and coronary artery disease who presents with dizziness and concerns about blood pressure management.  She experiences persistent dizziness, which she attributes to her blood pressure being lower than usual, around 118.  She is currently taking carvedilol , isosorbide , Plavix , and Eliquis . She ran out of the 2.5 mg dose of Eliquis  but has the 5 mg dose available. Her medication was adjusted due to kidney issues during a previous hospitalization, but her kidney function has improved recently.  She was hospitalized on October 16th for low potassium and sodium levels, experiencing severe vomiting and diarrhea, suspected to be food poisoning. She was dehydrated, with blood in her urine and stool, and treated for a possible UTI with IV fluids to correct electrolyte imbalance.  She reports ongoing cardiac issues, including occasional pain and  brief episodes of atrial tachycardia and atrial flutter, with the longest episode lasting three and a half minutes. Her last pacemaker interrogation confirmed these episodes.  She experiences difficulty sleeping, often waking due to stomach pain and shortness of breath. She sleeps on two pillows and notes stomach pain, particularly at night, but does not wake up short of breath when lying flat. She also notes some swelling, though less severe than in the past.  She has a history  of coronary artery disease and has undergone heart surgery, including stent placement.   Cardiovascular ROS: positive for - chest pain, dyspnea on exertion, and not really much changed from her baseline, but the chest pain remains random.  She is still having dyspnea but that also seems to be improved she just generally does not feel well but is slowly improved.  Occasional fluttering sensations but nothing prolonged lasting less than 5 minutes. Edema stable.. negative for - loss of consciousness, orthopnea, paroxysmal nocturnal dyspnea, rapid heart rate, shortness of breath, or syncope or near significant TIA or arm or claudication.  Melena, hematochezia, hematuria or epistaxis.  ROS:  Review of Systems - Negative except GI symptoms noted above.  Thankfully no more diarrhea, but does have some loose stools.  No more nausea or vomiting.  No more hematuria and no dysuria.    Objective   Current Meds clopidogrel  (PLAVIX ) 75 MG tablet- take 1 tab daily furosemide  (LASIX ) 80 MG tablet - Take 1 tablet (80 mg total) by mouth 2 (two) times daily. May take a additional if weight is 3 lbs or more with daily weight indapamide  (LOZOL ) 1.25 MG tablet - Take 1 tablet (1.25 mg total) by mouth daily.  nitroGLYCERIN  (NITROSTAT ) 0.4 MG SL tablet DISSOLVE 1 TABLET UNDER THE TONGUE EVERY 5 MINUTES AS NEEDED FOR CHEST PAIN    Medication Sig   amiodarone  (PACERONE ) 200 MG tablet = pill in pocket PRN TAKE 400 MG (2 TABLETS) ONE DOSE AS NEED FOR A FAST HEART RATE ,IF AFTER 12 HOURS STILL PRESENT TAKE 200 MG ( 1 TABLET) ONE DOSE FOR EACH EPISODE   Artificial Tear Solution (TEARS RENEWED OP) Apply 1 drop to eye daily as needed (dry eyes).   bismuth  subsalicylate (PEPTO BISMOL) 262 MG/15ML suspension Take 30 mLs by mouth every 6 (six) hours as needed for diarrhea or loose stools.   clopidogrel  (PLAVIX ) 75 MG tablet Take 1 tablet (75 mg total) by mouth daily with breakfast.   colchicine 0.6 MG tablet Take 0.6 mg by  mouth daily as needed (Gout).   cyanocobalamin  (VITAMIN B12) 1000 MCG tablet Take 1,000 mcg by mouth daily.   diclofenac sodium (VOLTAREN) 1 % GEL Apply 2 g topically 4 (four) times daily as needed (pain).   HYDROcodone -acetaminophen  (NORCO/VICODIN) 5-325 MG tablet Take 1 tablet by mouth every 6 (six) hours as needed for moderate pain (pain score 4-6).   hyoscyamine  (ANASPAZ ) 0.125 MG TBDP disintergrating tablet Place 1 tablet (0.125 mg total) under the tongue every 6 (six) hours as needed (abdominal cramping).   lidocaine  (XYLOCAINE ) 5 % ointment Apply 1 application. topically as needed.   nystatin  (MYCOSTATIN /NYSTOP ) powder Apply 1 Application topically 3 (three) times daily.   ondansetron  (ZOFRAN -ODT) 4 MG disintegrating tablet Take 1 tablet (4 mg total) by mouth every 8 (eight) hours as needed for nausea or vomiting.   Med Changes Today => 3 Meds stopped,    [DISCONTINUED] apixaban  (ELIQUIS ) 2.5 MG TABS tablet Take 1 tablet (2.5 mg total) by mouth 2 (two)  times daily.   [DISCONTINUED] carvedilol  (COREG ) 3.125 MG tablet Take 1 tablet (3.125 mg total) by mouth 2 (two) times daily.   [DISCONTINUED] isosorbide  mononitrate (IMDUR ) 30 MG 24 hr tablet Take 1 tablet (30 mg total) by mouth daily. May take an extra dose if you use Nitroglycerin   sublingual tablet for 2 days afterwards   New med:  bisoprolol (ZEBETA) 5 MG tablet Take 0.5 tablets (2.5 mg total) by mouth daily.  Change back to  apixaban  (ELIQUIS ) 5 MG TABS tablet Take 1 tablet (5 mg total) by mouth 2 (two) times daily.     Studies Reviewed: SABRA       Lab Results  Component Value Date   NA 136 06/30/2024   K 3.3 (L) 06/30/2024   CREATININE 1.59 (H) 06/30/2024   EGFR 32 (L) 06/30/2024   GLUCOSE 97 06/30/2024   Lab Results  Component Value Date   NA 131 (L) 07/13/2024   K 3.7 07/13/2024   CREATININE 0.99 07/13/2024   GFR 53.18 (L) 07/13/2024   GLUCOSE 95 07/13/2024   Echocardiogram: Normal LV size and function.  EF 60 to 65%.   No RWMA.  Mild asymmetric LVH-basal septal.  G1 DD.  Mildly increased RV size but normal function.  Normal RAP.  Trivial MR.  AoV sclerosis with no stenosis.  (06/05/2024)   PPM Interrogation:  Presenting rhythm is regular A sensed-V sensed (sinus rhythm). Not pacemaker dependent. Battery status is good.Heart rate histogram is favorable.  Lead measurements (capture threshold, sensing and impedance) are stable.  Occasional brief episodes of atrial tachycardia and atrial flutter, overall prevalence <0.1%, longest episode 3.5 minutes.  (07/09/2024)  Cardiac Cath-PCI (04/20/2024):   Diagnostic: (04/20/2024):  Intervention Findings     Two-vessel CAD involving RCA and LAD.  Widely patent over lapping LAD stents (BMS and DES); ostial proximal RCA 60 to 70% followed by widely patent BMS stent and then segmental 80% stenosis distal to stent.  High bifurcation of RCA into a large RPAV-PL and PDA. LVEDP estimated 20 to 22 mmHg.  Successful 2 site PCI of the RCA with a proximal and distal overlapping stent to the prior BMS:  3.0 mm x 20 mm Synergy XD DES overlapping distally covering the 80% stenosis (deployed to 3.3 mm);  3.0 mm x 16 mm Synergy XD DES overlapping the BMS proximally to the ostium deployed to 3.3 mm postdilated the ostium to 3.4 mm.    Risk Assessment/Calculations:    CHA2DS2-VASc Score = 5   This indicates a 7.2% annual risk of stroke. The patient's score is based upon: CHF History: 0 HTN History: 1 Diabetes History: 0 Stroke History: 0 Vascular Disease History: 1 Age Score: 2 Gender Score: 1              Physical Exam:   VS:  BP 118/66 (BP Location: Left Arm, Patient Position: Sitting)   Pulse 95   Ht 5' 7 (1.702 m)   Wt 181 lb (82.1 kg)   SpO2 98%   BMI 28.35 kg/m    Wt Readings from Last 3 Encounters:  07/14/24 181 lb (82.1 kg)  07/13/24 183 lb 3.2 oz (83.1 kg)  06/18/24 196 lb 6.9 oz (89.1 kg)      GEN: Well nourished, well developed in no acute distress;  actually relatively healthy-appearing today.  In better spirits.  She seems somewhat tired.  Weight is noted to be down from last visit. NECK: No JVD; No carotid bruits CARDIAC: Normal S1, S2; RRR,  no murmurs, rubs, gallops RESPIRATORY:  Clear to auscultation without rales, wheezing or rhonchi ; nonlabored, good air movement. ABDOMEN: Soft, non-tender, non-distended EXTREMITIES:  No edema; No deformity      ASSESSMENT AND PLAN: .    Problem List Items Addressed This Visit       Cardiology Problems   CAD S/P percutaneous coronary angioplasty: pRCA BMS (now with DES overlapping proximal and distal), mLAD BMS overlapped prox with DES for ISR (Chronic)   Recent stent placement in two areas. Echocardiogram and catheterization show no significant blockages. Previous stents well-managed. Symptoms likely related to recent illness. - Continue Plavix  for six months post-stent placement. => Would be ~ October 21 2024 for her to complete her course.  Would be okay to hold for urgent procedures or surgeries as of 07/21/2024      Relevant Medications   apixaban  (ELIQUIS ) 5 MG TABS tablet   bisoprolol (ZEBETA) 5 MG tablet   Congestive heart failure with LV diastolic dysfunction, NYHA class 2 (HCC) (Chronic)   Echocardiogram shows normal ejection fraction (65-70%) with mild hypertrophy due to hypertension. Grade 1 diastolic dysfunction present. Symptoms include shortness of breath and dizziness, possibly related to hypotension and recent illness. - Due to borderline low blood sugars, we are switching from carvedilol  to bisoprolol 0.5 mg daily to manage blood pressure and heart rate without excessive hypotension. - Monitor blood pressure regularly. - Consider reintroducing carvedilol  if blood pressure stabilizes. Continue current dose of furosemide  80 mg twice daily plus indapamide  now that her renal function is stabilized. Hold off on spironolactone  especially in light of her recent significant  hyponatremia and hypokalemia.      Relevant Medications   apixaban  (ELIQUIS ) 5 MG TABS tablet   bisoprolol (ZEBETA) 5 MG tablet   Coronary artery disease involving native coronary artery of native heart with angina pectoris - Primary (Chronic)   Two-vessel disease with widely patent LAD stent as well as old RCA stent with now overlapping stents on either side of the previous stent due to Denovo lesions. No real change with having reduced Imdur  dose, will DC dose but they are having dizziness. Hospital discharge after taking only 3.125 mg twice daily carvedilol , with her having issues with presumptive low blood pressure, we will switch her from carvedilol  to bisoprolol 2.5 mg daily. => Reconsider reintroducing carvedilol  if pressures stabilize. Not discussing lipids as the patient is not interested. Currently on Plavix  75 mg daily until mid February 2026      Relevant Medications   apixaban  (ELIQUIS ) 5 MG TABS tablet   bisoprolol (ZEBETA) 5 MG tablet   Hypercoagulable state due to paroxysmal atrial fibrillation (HCC) (Chronic)   On longstanding Eliquis  for intermittent episodes of A-fib.  Dose was reduced to 2.5 mg twice daily in the setting of acute on chronic renal failure.  Now that her creatinine is back to 0.99, okay to go back to 5 mg twice daily.  Okay to hold Eliquis  2 to 3 days preop for surgeries or procedures.   Also okay to hold for 2 to 3 days for bleeding      Relevant Medications   apixaban  (ELIQUIS ) 5 MG TABS tablet   bisoprolol (ZEBETA) 5 MG tablet   Labile hypertension (Chronic)   Blood pressure now low for her.  In the normal range overall, but I think we can back down on her beta-blocker dose by switching from carvedilol  to bisoprolol 2.5 mg daily.  Holding off on restarting ARB for now, but would  probably restart ARB for or converting back to carvedilol .      Relevant Medications   apixaban  (ELIQUIS ) 5 MG TABS tablet   bisoprolol (ZEBETA) 5 MG tablet   Mobitz  type 2 second degree atrioventricular block (Chronic)   Status post PPM      Relevant Medications   apixaban  (ELIQUIS ) 5 MG TABS tablet   bisoprolol (ZEBETA) 5 MG tablet   PAF (paroxysmal atrial fibrillation) (HCC) (Chronic)   Short bursts of atrial fibs or flutter noted on pacer interrogation.  Longest episodes have been 3 to 5 minutes. She has been on a beta-blocker so we are converting from carvedilol  to bisoprolol for less BP effect. - Continue Eliquis  for anticoagulation. - Monitor for significant heart rhythm changes.      Relevant Medications   apixaban  (ELIQUIS ) 5 MG TABS tablet   bisoprolol (ZEBETA) 5 MG tablet   PAT (paroxysmal atrial tachycardia) (Chronic)   Continue beta-blocker.  We are converting from carvedilol  to bisoprolol.      Relevant Medications   apixaban  (ELIQUIS ) 5 MG TABS tablet   bisoprolol (ZEBETA) 5 MG tablet     Other   Chronic kidney disease (CKD), stage III (moderate) (HCC) (Chronic)   Recent acute kidney injury with creatinine improved from 3.2 to 1.59 => and most recently checked yesterday 0.99.SABRA Kidney function stabilizing. - Monitor kidney function with regular lab tests. - Adjust Eliquis  dosage based on kidney function results.=> Now that the creatinine is below 1.5, we have converted her back to 5 mg twice daily Eliquis . -Need to be judicious with her diuretic, if she is having nausea and vomiting, she needs to hold off on taking at least one of the 2 doses of Lasix  as well as a indapamide .  Holding off on spironolactone  based on hyponatremia and hypokalemia.  Also holding off on ARB until blood pressure stabilized.      Continuous severe abdominal pain   Gastroenteritis with persistent gastrointestinal symptoms and chronic diarrhea Persistent symptoms post-gastroenteritis, including mucus-like stools. No C. diff or other infections. Symptoms may relate to recent illness and medication effects. - Referred to gastroenterologist for further  evaluation. - Monitor gastrointestinal symptoms and dietary intake.  Abdominal pain Intermittent pain possibly related to gastroenteritis and medication effects. - Consider dietary modifications to manage symptoms.      Orthostatic dizziness   Dizziness likely due to low blood pressure and recent illness. Managed by adjusting antihypertensive medications. - Switched from carvedilol  3.125 mg twice daily to bisoprolol 2.5 mg daily to manage blood pressure and heart rate without excessive hypotension. - Monitor blood pressure regularly.              Follow-Up: Return in about 4 months (around 11/11/2024) for 3-4 month follow-up, [facility] [address] Routine follow up with me.  I spent 48 minutes in the care of AMILEY SHISHIDO today including reviewing labs (2 minutes reviewing labs from hospitalization and to date labs posthospitalization.), reviewing studies (reviewed the recent echocardiogram report and pacer monitor report as well as review looking cath films-3 minutes), face to face time discussing treatment options (30 minutes discussing her hospitalization, recovery, the renal failure, hypokalemia/natremia, dosage of Eliquis  and potential adjustment doses of medications.  We discussed her overall symptoms and follow-up), reviewing records from recent hospitalization and PCP note (5 minutes), 11 minutes dictating, and documenting in the encounter.      Signed, Alm MICAEL Clay, MD, MS Alm Clay, M.D., M.S. Interventional Cardiologist  [facility]  Pager # [phone]

## 2024-07-14 NOTE — Assessment & Plan Note (Signed)
 Two-vessel disease with widely patent LAD stent as well as old RCA stent with now overlapping stents on either side of the previous stent due to Denovo lesions. No real change with having reduced Imdur  dose, will DC dose but they are having dizziness. Hospital discharge after taking only 3.125 mg twice daily carvedilol , with her having issues with presumptive low blood pressure, we will switch her from carvedilol  to bisoprolol 2.5 mg daily. => Reconsider reintroducing carvedilol  if pressures stabilize. Not discussing lipids as the patient is not interested. Currently on Plavix  75 mg daily until mid February 2026

## 2024-07-14 NOTE — Assessment & Plan Note (Signed)
 Gastroenteritis with persistent gastrointestinal symptoms and chronic diarrhea Persistent symptoms post-gastroenteritis, including mucus-like stools. No C. diff or other infections. Symptoms may relate to recent illness and medication effects. - Referred to gastroenterologist for further evaluation. - Monitor gastrointestinal symptoms and dietary intake.  Abdominal pain Intermittent pain possibly related to gastroenteritis and medication effects. - Consider dietary modifications to manage symptoms.

## 2024-07-14 NOTE — Assessment & Plan Note (Addendum)
 Recent acute kidney injury with creatinine improved from 3.2 to 1.59 => and most recently checked yesterday 0.99.SABRA Kidney function stabilizing. - Monitor kidney function with regular lab tests. - Adjust Eliquis  dosage based on kidney function results.=> Now that the creatinine is below 1.5, we have converted her back to 5 mg twice daily Eliquis . -Need to be judicious with her diuretic, if she is having nausea and vomiting, she needs to hold off on taking at least one of the 2 doses of Lasix  as well as a indapamide .  Holding off on spironolactone  based on hyponatremia and hypokalemia.  Also holding off on ARB until blood pressure stabilized.

## 2024-07-14 NOTE — Assessment & Plan Note (Signed)
 On longstanding Eliquis  for intermittent episodes of A-fib.  Dose was reduced to 2.5 mg twice daily in the setting of acute on chronic renal failure.  Now that her creatinine is back to 0.99, okay to go back to 5 mg twice daily.  Okay to hold Eliquis  2 to 3 days preop for surgeries or procedures.   Also okay to hold for 2 to 3 days for bleeding

## 2024-07-14 NOTE — Assessment & Plan Note (Signed)
Status post PPM 

## 2024-07-14 NOTE — Assessment & Plan Note (Signed)
 Echocardiogram shows normal ejection fraction (65-70%) with mild hypertrophy due to hypertension. Grade 1 diastolic dysfunction present. Symptoms include shortness of breath and dizziness, possibly related to hypotension and recent illness. - Due to borderline low blood sugars, we are switching from carvedilol  to bisoprolol 0.5 mg daily to manage blood pressure and heart rate without excessive hypotension. - Monitor blood pressure regularly. - Consider reintroducing carvedilol  if blood pressure stabilizes. Continue current dose of furosemide  80 mg twice daily plus indapamide  now that her renal function is stabilized. Hold off on spironolactone  especially in light of her recent significant hyponatremia and hypokalemia.

## 2024-07-15 NOTE — Telephone Encounter (Signed)
 Spoke with patient to advise of appointment with Nestor Blower, [facility] tomorrow, 07/16/24 at 820 am. Patient verbalizes understanding and says she will be present.

## 2024-07-15 NOTE — Progress Notes (Unsigned)
 Chief Complaint: Primary GI MD:  HPI: 83 year old female with history of afib on Eliquis  and amiodarone , pacemaker, CKD, HFpEF, CAD s/p RCA stent feb 2025 on plavix  (with plan to stop plavix  feb 2026)  rec   Discussed the use of AI scribe software for clinical note transcription with the patient, who gave verbal consent to proceed.  History of Present Illness      PREVIOUS GI WORKUP     Past Medical History:  Diagnosis Date   Adrenal adenoma    Arthritis    fingers, right shoulder (09/09/2017)   Atrial fibrillation (HCC) 12/2021   New diagnosis noted on pacemaker interrogation.   Back pain with radiation    Chronic Back Pain - mutliple surgeries (including tumor removal)   Bilateral edema of lower extremity    Chronic, likely related to venous stasis   Bradycardia    Pacemaker placed   CAD S/P percutaneous coronary angioplasty    a) LHC: 07/30/10. -- 3.0x24mm Integrity BMS to pRCA & 2.5x 15mm Integrity BMS mLAD(@ D2).  b) Class III-IV Angina 03/2011: LHC- ISR in LAD BMS -- prox overlapping Promus DES 2.5x64mm and PTCA of jailed D2 ostium-prox 80%. c) 02/03/12:  LHC- patent stents.  Jailed diagonal. with stable flow; d) Peri-OP NSTEMI 04/2012 - LHC in 12/'13 -    Celiac artery stenosis    12/2016 - STENT placement   Complication of anesthesia    used to wake up wild years ago (09/09/2017)   Congestive heart failure with LV diastolic dysfunction, NYHA class 2 (HCC)    06/13/10:  last 2D echo-  EF >55%, Mild TR, Mod Conc LVH - Grade 1 diastolic dysfunction (abnormal relaxation) --> LVEDP on Cath 28 mmHg & mild 2nd Pulm HTN   Diet-controlled type 2 diabetes mellitus (HCC)    Diverticulitis of colon (without mention of hemorrhage)(562.11)    Diverticulosis    Dyslipidemia, goal LDL below 70    Intolerant to statins   GERD (gastroesophageal reflux disease)    Hepatitis ~ 1957   yellow jaundice (01/14/2017)   Hepatomegaly    Hiatal hernia    History of blood transfusion  04/2012   when I had a heart attack   History of kidney stones    I've got a stone embedded in one of my kidneys (09/09/2017)   History of radiation therapy    Left Knee - 03/05/23-04/30/23- Dr. Lynwood Herring   History of stomach ulcers years ago   Irritable bowel syndrome (IBS)    Labile essential hypertension    Partially related to RAS   Mesenteric artery stenosis    95% Celiac Artery - ostial, 20-30% SMA.  Bilateral Renal A: L RA stent patent, R RA 20-30% -- Conservative Management   Mobitz type 2 second degree AV block    Status post PPM placement   NSTEMI (non-ST elevated myocardial infarction) (HCC) 04/2012   Unclear the details, apparently this was postoperative from her back surgery that she was cleared for my last saw her in June.  Reportedly had stents placed   Pancreas divisum    On pancreatic enzyme   PAT (paroxysmal atrial tachycardia)    Renal artery stenosis 2011; 12/'13   a) Angiogram 02/03/12:  50-60%L RA stenosis, 40% R R Inferior artery; b) 12/'13: S/P L RA Stent ([facility]) 6.0 mm x 15 mm; c) Renal Duplex 10/2013: <60% L RA, <60 R RA, ~60% SMA & Celiac A.   S/P placement of cardiac  pacemaker 07/2021   [employer]   Stricture and stenosis of esophagus    Thyroid  nodule    Unspecified gastritis and gastroduodenitis without mention of hemorrhage     Past Surgical History:  Procedure Laterality Date   APPENDECTOMY     BACK SURGERY     BREAST DUCTAL SYSTEM EXCISION     right   BREAST SURGERY Right    CARDIAC CATHETERIZATION N/A 03/16/2015   Procedure: Left Heart Cath and Coronary Angiography;  Surgeon: [name] Clay, MD;  Location: [facility] INVASIVE CV LAB;  Service: Cardiovascular; Widely patent m-dLAD stents as wellas pRCA stent.  High LVEDP, small Diag & Om vessels with moderate stenosis    Cardiac Event Monitor  01/2017   Mostly NSR with occasional sinus tachycardia and rare bradycardia. No A. fib. No PND or PSVT. Rare PACs and PVCs.   CATARACT EXTRACTION W/  INTRAOCULAR LENS  IMPLANT, BILATERAL Bilateral    CHOLECYSTECTOMY OPEN     COLONOSCOPY  03/01/2009   normal    CORONARY ANGIOPLASTY WITH STENT PLACEMENT  07/30/2010   3.0x59mm Integrity BMS to RCA and 2.5x 15mm Integrity BMS LAD.     CORONARY ANGIOPLASTY WITH STENT PLACEMENT  03/18/2011   Cutting Balloon PTCA of D2 (jailed - 80% ostial stenosis), DES PCI of mid LAD ISR - > Promus DES 2.5 x 16 mm postdilated to 2.8 mm) covering the proximal portion of the previous stent   CORONARY STENT INTERVENTION N/A 04/20/2024   Procedure: CORONARY STENT INTERVENTION;  Surgeon: Herring Tricia LELON, MD;  Location: [facility] INVASIVE CV LAB;  Service: CV:;; Successful 2 site PCI of the RCA with a proximal (Synergy XD 3.0 mm x 16 mm-->3.3 mm) and distal (Synergy XD 3.0 mm x 20 mm -> 3.3 mm)   DOBUTAMINE  STRESS ECHO  03/07/2015   [facility] (ordered for pre-op evaluation for EUS/ERCP --> abnormal EKG:  strreesss test shhoowwed 1 mm ST segment depressions downsloping. No wall motion abnormalities at peak exercise or at rest. Diastolic dysfunction was noted but normal systolic function - EF greater than 55%. No bouts of regurgitation or stenosis. Resting hypertension with exaggerated response   ESOPHAGOGASTRODUODENOSCOPY  04/08/2012   ESOPHAGOGASTRODUODENOSCOPY (EGD) WITH ESOPHAGEAL DILATION  X 2   FRACTURE SURGERY     HAMMER TOE SURGERY Bilateral    took bone off the top of 2nd toe on each foot   HIP SURGERY Left    something to do w/my back   JOINT REPLACEMENT     KNEE SURGERY Left    had fluid drained off it a couple times   LEFT HEART CATH AND CORONARY ANGIOGRAPHY  07/30/2010   severe LAD-diagonal 80%, moderate to severe proximal RCA.  Mean PAP 15 mmHg.  PCWP mean 17 mmHg.  RVP 45/11 mmHg.  PAP 47/24 mmHg, mean 31 mmHg.  LVEDP 27 mmHg   LEFT HEART CATH AND CORONARY ANGIOGRAPHY N/A 04/20/2024   Procedure: LEFT HEART CATH AND CORONARY ANGIOGRAPHY;  Surgeon: Herring Tricia LELON, MD;  Location: [facility] INVASIVE CV LAB;  Service:  CV: wo-vessel CAD involving RCA and LAD.  Widely patent over lapping LAD stents (BMS and DES); ostial proximal RCA 60 to 70% followed by widely patent BMS stent & segmental 80% stenosis distal to stent.  High bifurcation of RCA into a large RPAV-PL &PDA. LVEDP ~20 -22 mmHg.   LEFT HEART CATHETERIZATION WITH CORONARY ANGIOGRAM N/A 02/03/2012   Procedure: LEFT HEART CATHETERIZATION WITH CORONARY ANGIOGRAM;  Surgeon: [name] Clay, MD;  Location: [facility] CATH  LAB; widely patent LAD and RCA stents.  Patent D2 ostium.  Moderate L Renal A stenosis (56%), R Renal A 40-50% t.  LVEDP 20 mmHg.   LEFT HEART CATHETERIZATION WITH CORONARY ANGIOGRAM N/A 05/12/2014   Procedure: LEFT HEART CATHETERIZATION WITH CORONARY ANGIOGRAM;  Surgeon: Dorn JINNY Lesches, MD;  Location: [facility] CATH LAB;  Service: Cardiovascular: Stable CAD. Patent stents. Patent renal artery stent   LEFT HEART CATHETERIZATION WITH CORONARY ANGIOGRAM  08/2012   Peri-Op MI @ [facility] -- 40% ostial D1, patent LAD stents, 10% RCA ISR   LEFT HEART CATHETERIZATION WITH CORONARY ANGIOGRAM   03/18/2011   70% ISR of LAD stent just after D2 (D2 has ostial 80 to 90% stenosis.  20 to 30% ISR RCA.  EDP elevated at 28 mmHg   NM MYOVIEW  LTD  12/2016   LOW RISK study. No ischemia or infarction. EF 65-75%.   NM MYOVIEW  LTD  01/05/2020    LOW RISK. EF 60-65%.  No EKG changes.  No infarct, no ischemia.   PACEMAKER IMPLANT N/A 07/09/2021   Procedure: PACEMAKER IMPLANT;  Surgeon: Francyne Headland, MD;  Location: [facility] INVASIVE CV LAB;  Service: Cardiovascular;  Laterality: N/A;   PANCREAS SURGERY     stent in my pancreas; Dr Essie Minor   PERCUTANEOUS PINNING PHALANX FRACTURE OF HAND Left ~ 2013   PERIPHERAL VASCULAR BALLOON ANGIOPLASTY  09/09/2017   Procedure: PERIPHERAL VASCULAR BALLOON ANGIOPLASTY;  Surgeon: Serene Gaile ORN, MD;  Location: [facility] INVASIVE CV LAB;  Service: Cardiovascular;;  Celiac instent   PERIPHERAL VASCULAR INTERVENTION  01/14/2017    Procedure: Peripheral Vascular Intervention;  Surgeon: Serene Gaile ORN, MD;  Location: [facility] INVASIVE CV LAB;  Service: Cardiovascular;;  mesentric   RENAL ARTERY STENT Left 08/2012   @ [facility] - 6.0 mm x 15 mm   SPINE SURGERY     tumor removed 07/2010; Redo Surgery 04/2012; Sacroiliac Sgx 08/2013   TOTAL ABDOMINAL HYSTERECTOMY     TOTAL KNEE ARTHROPLASTY Left ~ 2012   TRANSTHORACIC ECHOCARDIOGRAM  07/10/2021   (Postop PPM): Normal LV size and function.  EF 60 to 65%.  No RWMA.  GR 1 DD.  Mild LA dilation.  Normal RV size and function.  Normal RAP.  Normal aortic and mitral valves.  No pericardial effusion.   VISCERAL ANGIOGRAM N/A 05/12/2014   Procedure: VISCERAL ANGIOGRAM;  Surgeon: Dorn JINNY Lesches, MD;  Location: [facility] CATH LAB;  25% ostial proximal celiac artery with downward takeoff.  23% proximal SMA and 56% proximal IMA.  Left renal artery stent widely patent.  Right renal artery is 20 to 30% proximal stenosis.   VISCERAL ANGIOGRAPHY N/A 01/14/2017   Procedure: Mesenteric  Angiography;  Surgeon: Serene Gaile ORN, MD;  Location: [facility] INVASIVE CV LAB;  Service: Cardiovascular;  Laterality: N/A;   VISCERAL ANGIOGRAPHY N/A 09/09/2017   Procedure: VISCERAL ANGIOGRAPHY;  Surgeon: Serene Gaile ORN, MD;  Location: [facility] INVASIVE CV LAB;  Service: Cardiovascular;  Laterality: N/A;    Current Outpatient Medications  Medication Sig Dispense Refill   allopurinol  (ZYLOPRIM ) 100 MG tablet TAKE 1 TABLET BY MOUTH EVERY DAY (Patient not taking: Reported on 07/14/2024) 90 tablet 2   amiodarone  (PACERONE ) 200 MG tablet TAKE 400 MG (2 TABLETS) ONE DOSE AS NEED FOR A FAST HEART RATE ,IF AFTER 12 HOURS STILL PRESENT TAKE 200 MG ( 1 TABLET) ONE DOSE FOR EACH EPISODE 180 tablet 1   apixaban  (ELIQUIS ) 5 MG TABS tablet Take 1 tablet (5  mg total) by mouth 2 (two) times daily.     Artificial Tear Solution (TEARS RENEWED OP) Apply 1 drop to eye daily as needed (dry eyes).     bismuth  subsalicylate (PEPTO BISMOL)  262 MG/15ML suspension Take 30 mLs by mouth every 6 (six) hours as needed for diarrhea or loose stools.     bisoprolol (ZEBETA) 5 MG tablet Take 0.5 tablets (2.5 mg total) by mouth daily. 45 tablet 3   clopidogrel  (PLAVIX ) 75 MG tablet Take 1 tablet (75 mg total) by mouth daily with breakfast. 90 tablet 1   colchicine 0.6 MG tablet Take 0.6 mg by mouth daily as needed (Gout).     cyanocobalamin  (VITAMIN B12) 1000 MCG tablet Take 1,000 mcg by mouth daily.     diclofenac sodium (VOLTAREN) 1 % GEL Apply 2 g topically 4 (four) times daily as needed (pain).     furosemide  (LASIX ) 80 MG tablet Take 1 tablet (80 mg total) by mouth 2 (two) times daily. May take a additional if weight is 3 lbs or more with daily weight 200 tablet 3   HYDROcodone -acetaminophen  (NORCO/VICODIN) 5-325 MG tablet Take 1 tablet by mouth every 6 (six) hours as needed for moderate pain (pain score 4-6). 30 tablet 0   hyoscyamine  (ANASPAZ ) 0.125 MG TBDP disintergrating tablet Place 1 tablet (0.125 mg total) under the tongue every 6 (six) hours as needed (abdominal cramping). 30 tablet 5   indapamide  (LOZOL ) 1.25 MG tablet Take 1 tablet (1.25 mg total) by mouth daily. 30 tablet 0   lactobacillus (FLORANEX/LACTINEX) PACK Take 1 packet (1 g total) by mouth 3 (three) times daily with meals. (Patient not taking: Reported on 07/14/2024) 90 packet 0   lidocaine  (XYLOCAINE ) 5 % ointment Apply 1 application. topically as needed. 35.44 g 0   loperamide (IMODIUM) 2 MG capsule Take 2 capsules (4 mg total) by mouth as needed for diarrhea or loose stools. (Patient not taking: Reported on 07/14/2024) 30 capsule 0   nitroGLYCERIN  (NITROSTAT ) 0.4 MG SL tablet DISSOLVE 1 TABLET UNDER THE TONGUE EVERY 5 MINUTES AS NEEDED FOR CHEST PAIN 25 tablet 2   nystatin  (MYCOSTATIN /NYSTOP ) powder Apply 1 Application topically 3 (three) times daily. 30 g 2   ondansetron  (ZOFRAN -ODT) 4 MG disintegrating tablet Take 1 tablet (4 mg total) by mouth every 8 (eight) hours  as needed for nausea or vomiting. 30 tablet 1   No current facility-administered medications for this visit.    Allergies as of 07/16/2024 - Review Complete 07/14/2024  Allergen Reaction Noted   Codeine phosphate Anaphylaxis, Shortness Of Breath, and Swelling 08/18/2006   Contrast media [iodinated contrast media] Swelling 09/04/2017   Penicillin g Anaphylaxis 10/16/2018   Prednisone  Other (See Comments), Dermatitis, and Itching 11/09/2014   Iodine  Rash, Swelling, and Dermatitis 08/18/2006   Statins Other (See Comments) 11/02/2012   Tramadol Palpitations and Other (See Comments) 11/02/2012    Family History  Problem Relation Age of Onset   Stroke Mother    Cancer Father        mets   Heart attack Father    Heart disease Father    Heart attack Brother    Diabetes Brother    Heart attack Brother    Colitis Maternal Grandfather    Cancer Daughter    Colon cancer Neg Hx    Stomach cancer Neg Hx     Social History   Socioeconomic History   Marital status: Married    Spouse name: Not on file   Number  of children: 1   Years of education: Not on file   Highest education level: Not on file  Occupational History   Occupation: gift shop owner    Employer: [employer]    Comment: She takes cakes and pies to sell, also canned vegetables and makes Tricia enjoys.  Tobacco Use   Smoking status: Never   Smokeless tobacco: Never  Vaping Use   Vaping status: Never Used  Substance and Sexual Activity   Alcohol use: No   Drug use: No   Sexual activity: Not Currently  Other Topics Concern   Not on file  Social History Narrative   Married 63 years in 2023. 1 child Conne in [place].  1 grandchild in [place].       Retired- worked at [employer] most recently[employer] and with teachers- aide position   - still selling cakes and pies   Very socially active, enjoys cooking and having get-togethers her house.       Hobbies: cooking/baking, loves to feed people    Social Drivers of Health   Financial Resource Strain: Low Risk  (06/14/2024)   Received from [facility]   Overall Financial Resource Strain (CARDIA)    How hard is it for you to pay for the very basics like food, housing, medical care, and heating?: Not hard at all  Food Insecurity: No Food Insecurity (06/24/2024)   Hunger Vital Sign    Worried About Running Out of Food in the Last Year: Never true    Ran Out of Food in the Last Year: Never true  Transportation Needs: No Transportation Needs (06/24/2024)   PRAPARE - Administrator, Civil Service (Medical): No    Lack of Transportation (Non-Medical): No  Physical Activity: Inactive (05/27/2024)   Exercise Vital Sign    Days of Exercise per Week: 0 days    Minutes of Exercise per Session: 0 min  Stress: No Stress Concern Present (06/14/2024)   Received from [facility] of Occupational Health - Occupational Stress Questionnaire    Do you feel stress - tense, restless, nervous, or anxious, or unable to sleep at night because your mind is troubled all the time - these days?: Not at all  Social Connections: Socially Integrated (06/18/2024)   Social Connection and Isolation Panel    Frequency of Communication with Friends and Family: Three times a week    Frequency of Social Gatherings with Friends and Family: Three times a week    Attends Religious Services: More than 4 times per year    Active Member of Clubs or Organizations: Yes    Attends Banker Meetings: More than 4 times per year    Marital Status: Married  Catering Manager Violence: Not At Risk (06/24/2024)   Humiliation, Afraid, Rape, and Kick questionnaire    Fear of Current or Ex-Partner: No    Emotionally Abused: No    Physically Abused: No    Sexually Abused: No    Review of Systems:    Constitutional: No weight loss, fever, chills, weakness or fatigue HEENT: Eyes: No change in vision               Ears, Nose, Throat:   No change in hearing or congestion Skin: No rash or itching Cardiovascular: No chest pain, chest pressure or palpitations   Respiratory: No SOB or cough Gastrointestinal: See HPI and otherwise negative Genitourinary: No dysuria or change in urinary frequency Neurological:  No headache, dizziness or syncope Musculoskeletal: No new muscle or joint pain Hematologic: No bleeding or bruising Psychiatric: No history of depression or anxiety    Physical Exam:  Vital signs: There were no vitals taken for this visit.  Constitutional: NAD, alert and cooperative Head:  Normocephalic and atraumatic. Eyes:   PEERL, EOMI. No icterus. Conjunctiva pink. Respiratory: Respirations even and unlabored. Lungs clear to auscultation bilaterally.   No wheezes, crackles, or rhonchi.  Cardiovascular:  Regular rate and rhythm. No peripheral edema, cyanosis or pallor.  Gastrointestinal:  Soft, nondistended, nontender. No rebound or guarding. Normal bowel sounds. No appreciable masses or hepatomegaly. Rectal:  Declines Msk:  Symmetrical without gross deformities. Without edema, no deformity or joint abnormality.  Neurologic:  Alert and  oriented x4;  grossly normal neurologically.  Skin:   Dry and intact without significant lesions or rashes. Psychiatric: Oriented to person, place and time. Demonstrates good judgement and reason without abnormal affect or behaviors.  Physical Exam    RELEVANT LABS AND IMAGING: CBC    Component Value Date/Time   WBC 10.9 (H) 07/13/2024 1609   RBC 3.92 07/13/2024 1609   HGB 12.1 07/13/2024 1609   HGB 10.5 (L) 06/17/2024 1044   HGB 12.0 04/02/2024 1316   HCT 36.5 07/13/2024 1609   HCT 36.9 04/02/2024 1316   PLT 373.0 07/13/2024 1609   PLT 310 06/17/2024 1044   PLT 306 04/02/2024 1316   MCV 93.2 07/13/2024 1609   MCV 93 04/02/2024 1316   MCH 31.7 06/22/2024 0353   MCHC 33.1 07/13/2024 1609   RDW 15.8 (H) 07/13/2024 1609   RDW 14.3 04/02/2024 1316   LYMPHSABS 1.4  07/13/2024 1609   MONOABS 1.3 (H) 07/13/2024 1609   EOSABS 0.0 07/13/2024 1609   BASOSABS 0.1 07/13/2024 1609    CMP     Component Value Date/Time   NA 131 (L) 07/13/2024 1609   NA 136 06/30/2024 1515   K 3.7 07/13/2024 1609   CL 90 (L) 07/13/2024 1609   CO2 31 07/13/2024 1609   GLUCOSE 95 07/13/2024 1609   BUN 24 (H) 07/13/2024 1609   BUN 37 (H) 06/30/2024 1515   CREATININE 0.99 07/13/2024 1609   CREATININE 3.22 (H) 06/17/2024 1044   CREATININE 0.90 10/24/2016 1127   CALCIUM 9.1 07/13/2024 1609   PROT 6.8 07/13/2024 1609   PROT 6.2 06/30/2024 1515   ALBUMIN 3.7 07/13/2024 1609   ALBUMIN 3.7 06/30/2024 1515   AST 12 07/13/2024 1609   AST 18 06/17/2024 1044   ALT 10 07/13/2024 1609   ALT 15 06/17/2024 1044   ALKPHOS 70 07/13/2024 1609   BILITOT 0.6 07/13/2024 1609   BILITOT 0.4 06/30/2024 1515   BILITOT 0.5 06/17/2024 1044   GFRNONAA 32 (L) 06/22/2024 0353   GFRNONAA 14 (L) 06/17/2024 1044   GFRAA 69 09/08/2019 1045     Assessment/Plan:   83 year old female with history of afib on Eliquis  and amiodarone , pacemaker, CKD, HFpEF, CAD s/p RCA stent feb 2025 on plavix  (with plan to stop plavix  feb 2026) presents for hospital follow up.  Abdominal pain Diarrhea GI pathogen panel and C diff negative. Abx for UTI 2 weeks prior to hospitalization. CT with sigmoid wall thickening without inflammation. Put on Zosyn without change of symptoms. Colonoscopy in 2010 for diarrhea showed severe diverticulosis in sigmoid, severe spasm in sigmoid very hard to transverse without stricture, hypertrophied anal papillae. Biopsies negative.  CAD s/p RCA stent Feb 2025 On Plavix  until feb 2026  Afib On Eliquis   HFpEF Echo 05/2024 with EF 65-70%  Juanpablo Ciresi Mollie RIGGERS [facility] 07/15/2024, 12:54 PM  Cc: Katrinka Garnette KIDD, MD

## 2024-07-16 ENCOUNTER — Encounter: Payer: Self-pay | Admitting: Gastroenterology

## 2024-07-16 ENCOUNTER — Ambulatory Visit: Admitting: Cardiology

## 2024-07-16 ENCOUNTER — Ambulatory Visit (INDEPENDENT_AMBULATORY_CARE_PROVIDER_SITE_OTHER): Admitting: Gastroenterology

## 2024-07-16 VITALS — BP 130/50 | HR 87 | Ht 66.5 in | Wt 183.1 lb

## 2024-07-16 DIAGNOSIS — R1032 Left lower quadrant pain: Secondary | ICD-10-CM | POA: Diagnosis not present

## 2024-07-16 DIAGNOSIS — D72829 Elevated white blood cell count, unspecified: Secondary | ICD-10-CM | POA: Diagnosis not present

## 2024-07-16 DIAGNOSIS — K551 Chronic vascular disorders of intestine: Secondary | ICD-10-CM

## 2024-07-16 DIAGNOSIS — I5030 Unspecified diastolic (congestive) heart failure: Secondary | ICD-10-CM | POA: Diagnosis not present

## 2024-07-16 DIAGNOSIS — K5790 Diverticulosis of intestine, part unspecified, without perforation or abscess without bleeding: Secondary | ICD-10-CM

## 2024-07-16 DIAGNOSIS — Q453 Other congenital malformations of pancreas and pancreatic duct: Secondary | ICD-10-CM

## 2024-07-16 DIAGNOSIS — I480 Paroxysmal atrial fibrillation: Secondary | ICD-10-CM | POA: Diagnosis not present

## 2024-07-16 MED ORDER — DIPHENHYDRAMINE HCL 50 MG PO TABS: ORAL_TABLET | ORAL | 0 refills | Status: AC

## 2024-07-16 MED ORDER — PREDNISONE 50 MG PO TABS: ORAL_TABLET | ORAL | 0 refills | Status: DC

## 2024-07-16 NOTE — Patient Instructions (Signed)
 Since you are allergic to one or more components in IV contrast, we have sent a prescription for (3) 50 mg tablets of Prednisone  to your pharmacy as a Pre-Medication Prep for your upcoming procedure requiring contrast.    Take (1) 50 mg tablet of prednisone  13 hours prior to your procedure at 11:00pm-tonight Take (1) 50 mg tablet 7 hours prior to your procedure at 5:00am.    Take (1) 50 mg tablet 1 hour prior to your procedure at 11:00am.    You also need to take 50 mg of Benadryl  1 hour prior to your procedure at 11:00am.  If you have 25 mg tablets of Benadryl , which can be purchased over the counter, you may take 2 tablets.  Otherwise we can send a prescription for (1) 50 mg tablet of Benadryl  to your pharmacy.    You have been scheduled for a CT scan of the abdomen and pelvis at [facility] (check in through the emergency department). You are scheduled on 07/17/24 at 2:00 pm. You should arrive at 12:00 pm.   Please follow the written instructions below on the day of your exam:   1) Do not eat anything after 10:00 am (4 hours prior to your test)     You may take any medications as prescribed with a small amount of water, if necessary. If you take any of the following medications: METFORMIN, GLUCOPHAGE, GLUCOVANCE, AVANDAMET, RIOMET, FORTAMET, ACTOPLUS MET, JANUMET, GLUMETZA or METAGLIP, you MAY be asked to HOLD this medication 48 hours AFTER the exam.   The purpose of you drinking the oral contrast is to aid in the visualization of your intestinal tract. The contrast solution may cause some diarrhea. Depending on your individual set of symptoms, you may also receive an intravenous injection of x-ray contrast/dye. Plan on being at [facility] for 45 minutes or longer, depending on the type of exam you are having performed.   If you have any questions regarding your exam or if you need to reschedule, you may call [facility] at [phone] between the hours of 8:00 am and 5:00  pm, Monday-Friday.

## 2024-07-17 ENCOUNTER — Ambulatory Visit (HOSPITAL_BASED_OUTPATIENT_CLINIC_OR_DEPARTMENT_OTHER)
Admission: RE | Admit: 2024-07-17 | Discharge: 2024-07-17 | Disposition: A | Source: Ambulatory Visit | Attending: Gastroenterology

## 2024-07-17 DIAGNOSIS — K5790 Diverticulosis of intestine, part unspecified, without perforation or abscess without bleeding: Secondary | ICD-10-CM | POA: Insufficient documentation

## 2024-07-17 DIAGNOSIS — K5900 Constipation, unspecified: Secondary | ICD-10-CM | POA: Diagnosis not present

## 2024-07-17 DIAGNOSIS — N200 Calculus of kidney: Secondary | ICD-10-CM | POA: Diagnosis not present

## 2024-07-17 DIAGNOSIS — K5732 Diverticulitis of large intestine without perforation or abscess without bleeding: Secondary | ICD-10-CM | POA: Diagnosis not present

## 2024-07-17 DIAGNOSIS — D72829 Elevated white blood cell count, unspecified: Secondary | ICD-10-CM | POA: Insufficient documentation

## 2024-07-17 DIAGNOSIS — R1032 Left lower quadrant pain: Secondary | ICD-10-CM | POA: Insufficient documentation

## 2024-07-17 MED ORDER — BARIUM SULFATE 2 % PO SUSP
450.0000 mL | Freq: Once | ORAL | Status: AC
  Administered 2024-07-17: 450 mL via ORAL

## 2024-07-17 MED ORDER — IOHEXOL 300 MG/ML  SOLN
80.0000 mL | Freq: Once | INTRAMUSCULAR | Status: AC | PRN
  Administered 2024-07-17: 80 mL via INTRAVENOUS

## 2024-07-18 ENCOUNTER — Encounter: Payer: Self-pay | Admitting: Gastroenterology

## 2024-07-18 MED ORDER — METRONIDAZOLE 500 MG PO TABS: 500.0000 mg | ORAL_TABLET | Freq: Three times a day (TID) | ORAL | 0 refills | Status: AC

## 2024-07-18 MED ORDER — CIPROFLOXACIN HCL 500 MG PO TABS: 500.0000 mg | ORAL_TABLET | Freq: Two times a day (BID) | ORAL | 0 refills | Status: AC

## 2024-07-19 ENCOUNTER — Ambulatory Visit: Payer: Self-pay | Admitting: Gastroenterology

## 2024-07-19 NOTE — Telephone Encounter (Signed)
 Spoke with patient , ensured she had filled rx sent in. Appt made with [facility] 1/06 @ 3:00.  Patient voiced understanding

## 2024-07-22 NOTE — Progress Notes (Signed)
 Addendum: Reviewed and agree with assessment and management plan. Agree with management plans as laid out. Kacyn Souder, Gordy HERO, MD

## 2024-07-24 ENCOUNTER — Other Ambulatory Visit: Payer: Self-pay | Admitting: Family Medicine

## 2024-07-24 DIAGNOSIS — H8110 Benign paroxysmal vertigo, unspecified ear: Secondary | ICD-10-CM

## 2024-08-10 ENCOUNTER — Telehealth: Payer: Self-pay | Admitting: Cardiology

## 2024-08-10 MED ORDER — CLOPIDOGREL BISULFATE 75 MG PO TABS: 75.0000 mg | ORAL_TABLET | Freq: Every day | ORAL | 3 refills | Status: AC

## 2024-08-10 NOTE — Telephone Encounter (Signed)
 Refills has been sent to the pharmacy.

## 2024-08-10 NOTE — Telephone Encounter (Signed)
*  STAT* If patient is at the pharmacy, call can be transferred to refill team.   1. Which medications need to be refilled? (please list name of each medication and dose if known) clopidogrel  (PLAVIX ) 75 MG tablet    4. Which pharmacy/location (including street and city if local pharmacy) is medication to be sent to?  [id number] - [place], [place] - [address] Phone: [phone]  Fax: [phone]       5. Do they need a 30 day or 90 day supply? 90

## 2024-08-13 ENCOUNTER — Ambulatory Visit: Admitting: Family Medicine

## 2024-08-16 ENCOUNTER — Other Ambulatory Visit: Payer: Self-pay | Admitting: Cardiology

## 2024-08-16 ENCOUNTER — Other Ambulatory Visit: Payer: Self-pay | Admitting: Family Medicine

## 2024-08-24 ENCOUNTER — Ambulatory Visit: Admitting: Family Medicine

## 2024-08-24 ENCOUNTER — Encounter: Payer: Self-pay | Admitting: Family Medicine

## 2024-08-24 VITALS — BP 138/58 | HR 69 | Temp 98.4°F | Resp 14 | Ht 66.0 in | Wt 184.6 lb

## 2024-08-24 DIAGNOSIS — Z9861 Coronary angioplasty status: Secondary | ICD-10-CM

## 2024-08-24 DIAGNOSIS — E1142 Type 2 diabetes mellitus with diabetic polyneuropathy: Secondary | ICD-10-CM

## 2024-08-24 DIAGNOSIS — I2510 Atherosclerotic heart disease of native coronary artery without angina pectoris: Secondary | ICD-10-CM

## 2024-08-24 DIAGNOSIS — I5030 Unspecified diastolic (congestive) heart failure: Secondary | ICD-10-CM | POA: Diagnosis not present

## 2024-08-24 DIAGNOSIS — R300 Dysuria: Secondary | ICD-10-CM

## 2024-08-24 DIAGNOSIS — M1A09X0 Idiopathic chronic gout, multiple sites, without tophus (tophi): Secondary | ICD-10-CM

## 2024-08-24 DIAGNOSIS — M549 Dorsalgia, unspecified: Secondary | ICD-10-CM | POA: Diagnosis not present

## 2024-08-24 DIAGNOSIS — R0989 Other specified symptoms and signs involving the circulatory and respiratory systems: Secondary | ICD-10-CM

## 2024-08-24 DIAGNOSIS — I480 Paroxysmal atrial fibrillation: Secondary | ICD-10-CM | POA: Diagnosis not present

## 2024-08-24 MED ORDER — ONDANSETRON 4 MG PO TBDP: 4.0000 mg | ORAL_TABLET | Freq: Three times a day (TID) | ORAL | 1 refills | Status: AC | PRN

## 2024-08-24 MED ORDER — HYDROCODONE-ACETAMINOPHEN 5-325 MG PO TABS: 1.0000 | ORAL_TABLET | Freq: Four times a day (QID) | ORAL | 0 refills | Status: AC | PRN

## 2024-08-24 NOTE — Progress Notes (Signed)
 " Phone [phone] In person visit   Subjective:   [name] is a 83 y.o. year old very pleasant female patient who presents for/with See problem oriented charting Chief Complaint  Patient presents with   Diverticulitis    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    Priority: High   Osteoporosis 01/22/2022    Priority: High   Pacemaker 07/09/2021    Priority: High   Mobitz type 2 second degree atrioventricular block 06/20/2021    Priority: High   PAT (paroxysmal atrial tachycardia) 06/20/2021    Priority: High   PAF (paroxysmal atrial fibrillation) (HCC) 10/16/2020    Priority: High   PAD (peripheral artery disease) 01/14/2017    Priority: High   Type 2 diabetes mellitus with peripheral neuropathy (HCC) 10/09/2016    Priority: High   Congestive heart failure with LV diastolic dysfunction, NYHA class 2 (HCC) 09/12/2016    Priority: High   Mesenteric artery stenosis (HCC) 04/20/2014    Priority: High   Abdominal pain, epigastric 01/19/2014    Priority: High   Celiac artery stenosis 11/27/2013    Priority: High   Renal artery stenosis 08/29/2012    Priority: High   Coronary artery disease involving coronary bypass graft of native heart with angina pectoris 10/30/2011    Priority: High   CAD S/P percutaneous coronary angioplasty: pRCA BMS (now with DES overlapping proximal and distal), mLAD BMS overlapped prox with DES for ISR 07/30/2010    Priority: High   B12 deficiency 01/10/2022    Priority: Medium    Continuous severe abdominal pain 10/11/2016    Priority: Medium    Exertional dyspnea 03/16/2015    Priority: Medium    Idiopathic chronic pancreatitis (HCC) 03/10/2014    Priority: Medium    Abnormal CT of liver-possible cirrhosis 03/10/2014    Priority: Medium    Stasis edema of bilateral lower extremity     Priority: Medium    Labile hypertension     Priority: Medium     Hyperlipidemia associated with type 2 diabetes mellitus (HCC)     Priority: Medium    Myalgia due to statin 12/29/2011    Priority: Medium    GERD (gastroesophageal reflux disease) 01/24/2009    Priority: Medium    Hypothyroid 11/12/2007    Priority: Medium    Irritable bowel syndrome 11/12/2007    Priority: Medium    PANCREAS DIVISUM 11/12/2007    Priority: Medium    Allergic rhinitis 01/10/2022    Priority: Low   Pneumothorax 07/10/2021    Priority: Low   Syncope and collapse 06/20/2021    Priority: Low   Hallux valgus (acquired), right foot 08/17/2018    Priority: Low   Costochondritis 01/21/2017    Priority: Low   Onychomycosis due to dermatophyte 11/16/2015    Priority: Low   Abnormal pharmacologic myocardial perfusion study 03/15/2015    Priority: Low   Dysphagia 03/10/2014    Priority: Low   Leg cramps 01/19/2014    Priority: Low   Palpitations 01/12/2014    Priority: Low   Left renal artery stenosis 08/29/2012    Priority: Low   Diarrhea due to malabsorption 01/24/2009    Priority: Low   Nephrolithiasis 11/12/2007    Priority: Low   HIATAL HERNIA 11/20/2005    Priority: Low   Orthostatic dizziness 07/14/2024   Hyponatremia 06/17/2024   Kyphoscoliosis and scoliosis 05/17/2024   Chronic kidney  disease (CKD), stage III (moderate) (HCC) 05/17/2024   Atypical angina 04/02/2024   Gross hematuria 11/19/2023   Hypercoagulable state due to paroxysmal atrial fibrillation (HCC) 10/01/2023   Nodular fasciitis 12/20/2022   MI (myocardial infarction) (HCC) 04/02/2012    Medications- reviewed and updated Current Outpatient Medications  Medication Sig Dispense Refill   allopurinol  (ZYLOPRIM ) 100 MG tablet TAKE 1 TABLET BY MOUTH EVERY DAY 90 tablet 2   amiodarone  (PACERONE ) 200 MG tablet TAKE 400 MG (2 TABLETS) ONE DOSE AS NEED FOR A FAST HEART RATE ,IF AFTER 12 HOURS STILL PRESENT TAKE 200 MG ( 1 TABLET) ONE DOSE FOR EACH EPISODE 180 tablet 1   apixaban  (ELIQUIS ) 5 MG  TABS tablet Take 1 tablet (5 mg total) by mouth 2 (two) times daily.     Artificial Tear Solution (TEARS RENEWED OP) Apply 1 drop to eye daily as needed (dry eyes).     bismuth  subsalicylate (PEPTO BISMOL) 262 MG/15ML suspension Take 30 mLs by mouth every 6 (six) hours as needed for diarrhea or loose stools.     bisoprolol  (ZEBETA ) 5 MG tablet Take 0.5 tablets (2.5 mg total) by mouth daily. 45 tablet 3   clopidogrel  (PLAVIX ) 75 MG tablet Take 1 tablet (75 mg total) by mouth daily with breakfast. 90 tablet 3   colchicine 0.6 MG tablet Take 0.6 mg by mouth daily as needed (Gout).     cyanocobalamin  (VITAMIN B12) 1000 MCG tablet Take 1,000 mcg by mouth daily.     diclofenac sodium (VOLTAREN) 1 % GEL Apply 2 g topically 4 (four) times daily as needed (pain).     diphenhydrAMINE  (BENADRYL ) 50 MG tablet Take 1 tablet by mouth 1 hour prior to your CT scan 1 tablet 0   furosemide  (LASIX ) 80 MG tablet Take 1 tablet (80 mg total) by mouth 2 (two) times daily. May take a additional if weight is 3 lbs or more with daily weight 200 tablet 3   hyoscyamine  (ANASPAZ ) 0.125 MG TBDP disintergrating tablet Place 1 tablet (0.125 mg total) under the tongue every 6 (six) hours as needed (abdominal cramping). 30 tablet 5   indapamide  (LOZOL ) 1.25 MG tablet Take 1 tablet (1.25 mg total) by mouth daily. 30 tablet 0   lactobacillus (FLORANEX/LACTINEX) PACK Take 1 packet (1 g total) by mouth 3 (three) times daily with meals. 90 packet 0   lidocaine  (XYLOCAINE ) 5 % ointment Apply 1 application. topically as needed. 35.44 g 0   loperamide  (IMODIUM ) 2 MG capsule Take 2 capsules (4 mg total) by mouth as needed for diarrhea or loose stools. 30 capsule 0   meclizine  (ANTIVERT ) 12.5 MG tablet TAKE 1 TABLET (12.5 MG TOTAL) BY MOUTH 2 (TWO) TIMES DAILY AS NEEDED FOR DIZZINESS. 60 tablet 1   nitroGLYCERIN  (NITROSTAT ) 0.4 MG SL tablet DISSOLVE 1 TABLET UNDER THE TONGUE EVERY 5 MINUTES AS NEEDED FOR CHEST PAIN 25 tablet 2   nystatin   (MYCOSTATIN /NYSTOP ) powder Apply 1 Application topically 3 (three) times daily. 30 g 2   HYDROcodone -acetaminophen  (NORCO/VICODIN) 5-325 MG tablet Take 1 tablet by mouth every 6 (six) hours as needed for moderate pain (pain score 4-6). 30 tablet 0   ondansetron  (ZOFRAN -ODT) 4 MG disintegrating tablet Take 1 tablet (4 mg total) by mouth every 8 (eight) hours as needed for nausea or vomiting. 30 tablet 1   No current facility-administered medications for this visit.     Objective:  BP (!) 138/58   Pulse 69   Temp 98.4 F (36.9 C) (Temporal)  Resp 14   Ht 5' 6 (1.676 m)   Wt 184 lb 9.6 oz (83.7 kg)   SpO2 97%   BMI 29.80 kg/m  Gen: NAD, resting comfortably CV: RRR no murmurs rubs or gallops Lungs: CTAB no crackles, wheeze, rhonchi Ext: 1+ edema Skin: warm, dry     Assessment and Plan   #Prior MRI of thoracic spine on hold for ongoing abdominal pain with concern for referred pain- was ordere dby Dr. Joane but I will reorder to get this ball rolling again  - she still uses intermittent hydrocodone  but #30 from September has lasted her until now even with the significant flare from diverticulitis  # Diverticulitis-we had referred her with intermittent blood in stool reported as well as sigmoid thickening on CT from prior hospitalization to rule out potential malignancy unfortunately on CT abdomen pelvis on 07/16/2024 . Progressive sigmoid diverticulitis, with worsening sigmoid colon wall thickening since prior study. No perforation, fluid collection, or abscess. -She was placed on a 14-day course of ciprofloxacin  and Flagyl - this helped the lower abdominal pain and the more severe pains she had significantly and that portion resolved- but she continues to have the upper abdominal and side pain in left side but not as severe and actually was better on antibiotics.   -prior diarrhea is better- wants to hold off on C diff testing- comes and goes -some mild dysuria- check UA and  culture  #hyponatremia with gut and diet better hoping sodium loks stbale to improved.   # Primary myxofibrosarcoma-just had follow-up with Dr. Timmy on 04/09/2023- so far scans reassuring after resection despite high grade and positive margins -Has completed radiation  -MRI of the knee ordered in March 2025 - completd in April and no recurrence of cancer   #CAD with angina- on imdur - despite PCI x2 (last 2025-Underwent outpatient cardiac catheterization 8/19 with Dr. Anner noted above with two-vessel CAD including the RCA and LAD with widely patent mid LAD stents.) LPA not elevated.  #PAD- renal artery stenosis, mesenteric artery stenosis. Notes state mesenteric/renal stent before 2020.  #hyperlipidemia but with myalgias related to statins #Mobitz type 2 second degree AV block s/p pacemaker November 2022 S: Medication:off imdur ,  coreg  6.25  mg twice daily--> bisoprolol  5 mg due to lower blood pressures on carvedilol , on eliquis  2.5 mg  BID  -plavix  rstareted 2025 after stent with plan to continue at least through February 2026 - no cholesterol treatment as of 2023-statin intolerant and failed other lipid management - has not tried pcsk9 inhibitors  A/P: CAD seems improved after stent- continue to monitor - continue current medications.   # Atrial fibrillation- discovered by pacemaker interrogation (very symptomatic) S: Rate controlled on bisoprolol  5 mg - on amiodarone  400 mg with fast HR, additional 200 12 hours later if persists per rx Anticoagulated with eliquis  2.5 mg BID A/P: appropriately anticoagulated and rate controlled- continue current medicine     # Diabetes S: Medication:Diet controlled Lab Results  Component Value Date   HGBA1C 6.2 06/01/2024   HGBA1C 6.2 02/19/2024   HGBA1C 5.9 (A) 11/13/2023  A/P: stable- continue current medicines . Too soon for repeat a1c   #CHF diastolic, NYHA class III -also with venous stasis edema- wears compression #hypertension S:  Medication:  lasix  80 mg (BID if needed for edema),, bisoprolol  5 mg, indapamide  1.25 mg -imdur  and spironolactone  on hold as well as losartan   Edema: no worsening Weight gain:within 1 lb Shortness of breath:  baseline is exertional dyspnea if  overdoes it- no change BP Readings from Last 3 Encounters:  08/24/24 (!) 138/58  07/16/24 (!) 130/50  07/14/24 118/66   A/P: blood pressure systolic trending up but diastolic still low. With her tendency to orthostasis we opted to hold off on adjustment CHF euvolemic- continue current medications    #gout S: Medication: Allopurinol  100 mg daily started after uric acid of 9.9 was noted -flare in hospital when they held medicine.  -Has colchicine on hand but not needing A/P:last uric acid at 7- update today- may treat for level of 6  in case some sort of atypical gouty reaction contributing of the left upper quadrant of abdomen and rib pain   #fall in bedroom- feels off balance in morning. Getting up quickly in night and gets dizzy. Couple months ago hit head- still some headache(s) but getting better- advised if recurrent to seek care immediately- offered CT scan to rule out bleed but she declines. Agrees if worsening to follow up with us   Recommended follow up: Return in about 3 months (around 11/22/2024) for followup or sooner if needed.Schedule b4 you leave. Future Appointments  Date Time Provider Department Center  09/07/2024  3:00 PM Mollie Nestor [facility]  09/08/2024 10:30 AM [facility] None  09/08/2024 10:45 AM Ennever, Maude SAUNDERS, MD [facility] None  10/08/2024  7:05 AM [facility] DEVICE REMOTES [facility]  11/02/2024 10:40 AM Anner Alm ORN, MD [facility]  01/07/2025  7:05 AM [facility] DEVICE REMOTES [facility]  04/08/2025  7:05 AM [facility] DEVICE REMOTES [facility]  05/31/2025  3:40 PM [facility] ANNUAL WELLNESS VISIT 1 [facility] [place]  07/08/2025  7:05 AM [facility] DEVICE REMOTES [facility]    Lab/Order  associations:   ICD-10-CM   1. Idiopathic chronic gout of multiple sites without tophus  M1A.90K9 Uric acid    2. Mid back pain  M54.9 MR THORACIC SPINE WO CONTRAST    3. Labile hypertension  R09.89 CBC with Differential/Platelet    Comprehensive metabolic panel with GFR    Urinalysis, Routine w reflex microscopic    4. Type 2 diabetes mellitus with peripheral neuropathy (HCC)  E11.42     5. PAF (paroxysmal atrial fibrillation) (HCC)  I48.0     6. Congestive heart failure with LV diastolic dysfunction, NYHA class 2 (HCC)  I50.30     7. CAD S/P percutaneous coronary angioplasty: pRCA BMS (now with DES overlapping proximal and distal), mLAD BMS overlapped prox with DES for ISR  I25.10    Z98.61       Meds ordered this encounter  Medications   ondansetron  (ZOFRAN -ODT) 4 MG disintegrating tablet    Sig: Take 1 tablet (4 mg total) by mouth every 8 (eight) hours as needed for nausea or vomiting.    Dispense:  30 tablet    Refill:  1   HYDROcodone -acetaminophen  (NORCO/VICODIN) 5-325 MG tablet    Sig: Take 1 tablet by mouth every 6 (six) hours as needed for moderate pain (pain score 4-6).    Dispense:  30 tablet    Refill:  0   I personally spent a total of 40 minutes in the care of the patient today including preparing to see the patient, getting/reviewing separately obtained history, counseling and educating on multiple medical topics and reviewing recent worsening abdominal pain with improvement with diverticulitis treatment, placing orders, and documenting clinical information in the EHR.   Return precautions advised.  Garnette Lukes, MD  "

## 2024-08-24 NOTE — Patient Instructions (Addendum)
 We will call you within two weeks about your referral to MRI thoracic spine through [facility].  Their phone number is [phone].  Please call them if you have not heard in 1-2 weeks  Please stop by lab before you go If you have mychart- we will send your results within 3 business days of us  receiving them.  If you do not have mychart- we will call you about results within 5 business days of us  receiving them.  *please also note that you will see labs on mychart as soon as they post. I will later go in and write notes on them- will say notes from Dr. Katrinka   Recommended follow up: Return in about 3 months (around 11/22/2024) for followup or sooner if needed.Schedule b4 you leave.

## 2024-08-25 LAB — URIC ACID: Uric Acid, Serum: 6.8 mg/dL (ref 2.4–7.0)

## 2024-08-25 LAB — URINALYSIS, ROUTINE W REFLEX MICROSCOPIC
Bilirubin Urine: NEGATIVE
Hgb urine dipstick: NEGATIVE
Ketones, ur: NEGATIVE
Leukocytes,Ua: NEGATIVE
Nitrite: NEGATIVE
RBC / HPF: NONE SEEN
Specific Gravity, Urine: 1.01 (ref 1.000–1.030)
Total Protein, Urine: NEGATIVE
Urine Glucose: NEGATIVE
Urobilinogen, UA: 0.2 (ref 0.0–1.0)
pH: 6.5 (ref 5.0–8.0)

## 2024-08-25 LAB — COMPREHENSIVE METABOLIC PANEL WITH GFR
ALT: 11 U/L (ref 3–35)
AST: 14 U/L (ref 5–37)
Albumin: 3.6 g/dL (ref 3.5–5.2)
Alkaline Phosphatase: 63 U/L (ref 39–117)
BUN: 20 mg/dL (ref 6–23)
CO2: 24 meq/L (ref 19–32)
Calcium: 9 mg/dL (ref 8.4–10.5)
Chloride: 104 meq/L (ref 96–112)
Creatinine, Ser: 0.92 mg/dL (ref 0.40–1.20)
GFR: 58.03 mL/min — ABNORMAL LOW
Glucose, Bld: 82 mg/dL (ref 70–99)
Potassium: 3.2 meq/L — ABNORMAL LOW (ref 3.5–5.1)
Sodium: 139 meq/L (ref 135–145)
Total Bilirubin: 0.4 mg/dL (ref 0.2–1.2)
Total Protein: 7 g/dL (ref 6.0–8.3)

## 2024-08-25 LAB — CBC WITH DIFFERENTIAL/PLATELET
Basophils Absolute: 0.1 K/uL (ref 0.0–0.1)
Basophils Relative: 1 % (ref 0.0–3.0)
Eosinophils Absolute: 0.1 K/uL (ref 0.0–0.7)
Eosinophils Relative: 0.7 % (ref 0.0–5.0)
HCT: 35.5 % — ABNORMAL LOW (ref 36.0–46.0)
Hemoglobin: 11.7 g/dL — ABNORMAL LOW (ref 12.0–15.0)
Lymphocytes Relative: 21.5 % (ref 12.0–46.0)
Lymphs Abs: 2 K/uL (ref 0.7–4.0)
MCHC: 32.9 g/dL (ref 30.0–36.0)
MCV: 91.1 fl (ref 78.0–100.0)
Monocytes Absolute: 0.8 K/uL (ref 0.1–1.0)
Monocytes Relative: 8.7 % (ref 3.0–12.0)
Neutro Abs: 6.3 K/uL (ref 1.4–7.7)
Neutrophils Relative %: 68.1 % (ref 43.0–77.0)
Platelets: 338 K/uL (ref 150.0–400.0)
RBC: 3.89 Mil/uL (ref 3.87–5.11)
RDW: 16.9 % — ABNORMAL HIGH (ref 11.5–15.5)
WBC: 9.2 K/uL (ref 4.0–10.5)

## 2024-08-27 LAB — URINE CULTURE
MICRO NUMBER:: 17392044
SPECIMEN QUALITY:: ADEQUATE

## 2024-08-30 ENCOUNTER — Ambulatory Visit: Payer: Self-pay | Admitting: Family Medicine

## 2024-08-30 MED ORDER — SULFAMETHOXAZOLE-TRIMETHOPRIM 800-160 MG PO TABS: 1.0000 | ORAL_TABLET | Freq: Two times a day (BID) | ORAL | 0 refills | Status: AC

## 2024-08-31 ENCOUNTER — Ambulatory Visit: Admitting: Physician Assistant

## 2024-09-07 ENCOUNTER — Ambulatory Visit: Admitting: Gastroenterology

## 2024-09-07 DIAGNOSIS — C786 Secondary malignant neoplasm of retroperitoneum and peritoneum: Secondary | ICD-10-CM

## 2024-09-07 DIAGNOSIS — C499 Malignant neoplasm of connective and soft tissue, unspecified: Secondary | ICD-10-CM

## 2024-09-08 ENCOUNTER — Inpatient Hospital Stay: Admitting: Hematology & Oncology

## 2024-09-08 ENCOUNTER — Inpatient Hospital Stay: Attending: Hematology & Oncology

## 2024-09-08 ENCOUNTER — Encounter: Payer: Self-pay | Admitting: Hematology & Oncology

## 2024-09-08 VITALS — BP 104/51 | HR 69 | Temp 98.0°F | Resp 20 | Ht 66.0 in | Wt 177.1 lb

## 2024-09-08 DIAGNOSIS — C499 Malignant neoplasm of connective and soft tissue, unspecified: Secondary | ICD-10-CM

## 2024-09-08 DIAGNOSIS — C786 Secondary malignant neoplasm of retroperitoneum and peritoneum: Secondary | ICD-10-CM

## 2024-09-08 LAB — CMP (CANCER CENTER ONLY)
ALT: 8 U/L (ref 0–44)
AST: 14 U/L — ABNORMAL LOW (ref 15–41)
Albumin: 3.8 g/dL (ref 3.5–5.0)
Alkaline Phosphatase: 80 U/L (ref 38–126)
Anion gap: 11 (ref 5–15)
BUN: 22 mg/dL (ref 8–23)
CO2: 26 mmol/L (ref 22–32)
Calcium: 9.3 mg/dL (ref 8.9–10.3)
Chloride: 98 mmol/L (ref 98–111)
Creatinine: 1.41 mg/dL — ABNORMAL HIGH (ref 0.44–1.00)
GFR, Estimated: 37 mL/min — ABNORMAL LOW
Glucose, Bld: 94 mg/dL (ref 70–99)
Potassium: 4.7 mmol/L (ref 3.5–5.1)
Sodium: 135 mmol/L (ref 135–145)
Total Bilirubin: 0.3 mg/dL (ref 0.0–1.2)
Total Protein: 6.9 g/dL (ref 6.5–8.1)

## 2024-09-08 LAB — CBC WITH DIFFERENTIAL (CANCER CENTER ONLY)
Abs Immature Granulocytes: 0.02 K/uL (ref 0.00–0.07)
Basophils Absolute: 0 K/uL (ref 0.0–0.1)
Basophils Relative: 1 %
Eosinophils Absolute: 0.1 K/uL (ref 0.0–0.5)
Eosinophils Relative: 2 %
HCT: 34.9 % — ABNORMAL LOW (ref 36.0–46.0)
Hemoglobin: 11.1 g/dL — ABNORMAL LOW (ref 12.0–15.0)
Immature Granulocytes: 0 %
Lymphocytes Relative: 23 %
Lymphs Abs: 1.4 K/uL (ref 0.7–4.0)
MCH: 29.8 pg (ref 26.0–34.0)
MCHC: 31.8 g/dL (ref 30.0–36.0)
MCV: 93.6 fL (ref 80.0–100.0)
Monocytes Absolute: 0.7 K/uL (ref 0.1–1.0)
Monocytes Relative: 11 %
Neutro Abs: 3.6 K/uL (ref 1.7–7.7)
Neutrophils Relative %: 63 %
Platelet Count: 264 K/uL (ref 150–400)
RBC: 3.73 MIL/uL — ABNORMAL LOW (ref 3.87–5.11)
RDW: 15.9 % — ABNORMAL HIGH (ref 11.5–15.5)
WBC Count: 5.8 K/uL (ref 4.0–10.5)
nRBC: 0 % (ref 0.0–0.2)

## 2024-09-08 LAB — MAGNESIUM: Magnesium: 2.2 mg/dL (ref 1.7–2.4)

## 2024-09-08 LAB — LACTATE DEHYDROGENASE: LDH: 185 U/L (ref 105–235)

## 2024-09-08 NOTE — Progress Notes (Signed)
 38 Hematology and Oncology Follow Up Visit  AZELIE NOGUERA [id number] Jun 15, 1941 84 y.o. 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 Handyside is back for follow-up.  She actually was hospitalized with diverticulitis.  This was back in October.  She has been followed by gastroenterology.  She has been on antibiotics.  She is feeling better.  Again, we follow her for the soft tissue sarcoma behind the left knee.  She feels that there are problems back there.  She feels that something is growing back there.  We will go ahead and do another PET scan on her.  The last PET scan that she had that was back in January and this was negative.  I think she sees the Orthopedic Oncologist at [facility] in early February.  She still has a little bit of abdominal discomfort.  She is not having any diarrhea.  She is not having any bleeding.  There has been no rashes.  She has had no cough or shortness of breath.  Overall, I would say that her performance status is probably ECOG 2.    Medications:  Current Outpatient Medications:    allopurinol  (ZYLOPRIM ) 100 MG tablet, TAKE 1 TABLET BY MOUTH EVERY DAY, Disp: 90 tablet, Rfl: 2   amiodarone  (PACERONE ) 200 MG tablet, TAKE 400 MG (2 TABLETS) ONE DOSE AS NEED FOR A FAST HEART RATE ,IF AFTER 12 HOURS STILL PRESENT TAKE 200 MG ( 1 TABLET) ONE DOSE FOR EACH EPISODE, Disp: 180 tablet, Rfl: 1   apixaban  (ELIQUIS ) 5 MG TABS tablet, Take 1 tablet (5 mg total) by mouth 2 (two) times daily., Disp: , Rfl:    Artificial Tear Solution (TEARS RENEWED OP), Apply 1 drop to eye daily as needed (dry eyes)., Disp: , Rfl:    Bismuth  Subsalicylate (KAOPECTATE PO), Take by mouth daily as needed., Disp: , Rfl:    bisoprolol  (ZEBETA ) 5 MG tablet, Take 0.5 tablets (2.5 mg total) by mouth daily., Disp: 45  tablet, Rfl: 3   clopidogrel  (PLAVIX ) 75 MG tablet, Take 1 tablet (75 mg total) by mouth daily with breakfast., Disp: 90 tablet, Rfl: 3   colchicine 0.6 MG tablet, Take 0.6 mg by mouth daily as needed (Gout)., Disp: , Rfl:    cyanocobalamin  (VITAMIN B12) 1000 MCG tablet, Take 1,000 mcg by mouth daily., Disp: , Rfl:    diclofenac sodium (VOLTAREN) 1 % GEL, Apply 2 g topically 4 (four) times daily as needed (pain)., Disp: , Rfl:    diphenhydrAMINE  (BENADRYL ) 50 MG tablet, Take 1 tablet by mouth 1 hour prior to your CT scan, Disp: 1 tablet, Rfl: 0   furosemide  (LASIX ) 80 MG tablet, Take 1 tablet (80 mg total) by mouth 2 (two) times daily. May take a additional if weight is 3 lbs or more with daily weight, Disp: 200 tablet, Rfl: 3   HYDROcodone -acetaminophen  (NORCO/VICODIN) 5-325 MG tablet, Take 1 tablet by mouth every 6 (six) hours as needed for moderate pain (pain score 4-6)., Disp: 30 tablet, Rfl: 0   lidocaine  (XYLOCAINE ) 5 % ointment, Apply 1 application. topically as needed., Disp: 35.44 g, Rfl: 0   meclizine  (ANTIVERT ) 12.5 MG tablet, TAKE 1 TABLET (12.5 MG TOTAL) BY MOUTH 2 (TWO) TIMES DAILY AS NEEDED FOR DIZZINESS., Disp: 60 tablet, Rfl: 1   nitroGLYCERIN  (NITROSTAT ) 0.4 MG SL tablet, DISSOLVE 1  TABLET UNDER THE TONGUE EVERY 5 MINUTES AS NEEDED FOR CHEST PAIN, Disp: 25 tablet, Rfl: 2   nystatin  (MYCOSTATIN /NYSTOP ) powder, Apply 1 Application topically 3 (three) times daily., Disp: 30 g, Rfl: 2   ondansetron  (ZOFRAN -ODT) 4 MG disintegrating tablet, Take 1 tablet (4 mg total) by mouth every 8 (eight) hours as needed for nausea or vomiting., Disp: 30 tablet, Rfl: 1   hyoscyamine  (ANASPAZ ) 0.125 MG TBDP disintergrating tablet, Place 1 tablet (0.125 mg total) under the tongue every 6 (six) hours as needed (abdominal cramping). (Patient not taking: Reported on 09/08/2024), Disp: 30 tablet, Rfl: 5   indapamide  (LOZOL ) 1.25 MG tablet, Take 1 tablet (1.25 mg total) by mouth daily. (Patient not taking:  Reported on 09/08/2024), Disp: 30 tablet, Rfl: 0   loperamide  (IMODIUM ) 2 MG capsule, Take 2 capsules (4 mg total) by mouth as needed for diarrhea or loose stools. (Patient not taking: Reported on 09/08/2024), Disp: 30 capsule, Rfl: 0  Allergies:  Allergies  Allergen Reactions   Codeine Phosphate Anaphylaxis, Shortness Of Breath and Swelling   Contrast Media [Iodinated Contrast Media] Swelling    Swelling of the face. Reaction was to ionic contrast many years ago. Patient has had non-ionic contrast many time without pre medication and has had no reaction.    Iodine  Rash, Swelling and Dermatitis    Many years ago. Topical iodine  only.  REACTION: unspecified This allergy is to Topical iodine  only.     REACTION: unspecified, This allergy is to Topical iodine  only.  Tolerates shrimp   Prednisone  Itching, Dermatitis and Other (See Comments)    Hyperactivity, insomnia   Statins Other (See Comments)    Hyperactivity  Intolerant  Hyperactivity    Intolerant, , Hyperactivity   Tramadol Palpitations and Other (See Comments)    Hyperactivity    Past Medical History, Surgical history, Social history, and Family History were reviewed and updated.  Review of Systems: Review of Systems  Constitutional: Negative.   HENT:  Negative.    Eyes: Negative.   Respiratory: Negative.    Cardiovascular: Negative.   Gastrointestinal: Negative.   Endocrine: Negative.   Genitourinary: Negative.    Musculoskeletal:  Positive for arthralgias.  Neurological: Negative.   Hematological: Negative.   Psychiatric/Behavioral: Negative.      Physical Exam:  height is 5' 6 (1.676 m) and weight is 177 lb 1.3 oz (80.3 kg). Her oral temperature is 98 F (36.7 C). Her blood pressure is 104/51 (abnormal) and her pulse is 69. Her respiration is 20 and oxygen saturation is 98%.   Wt Readings from Last 3 Encounters:  09/08/24 177 lb 1.3 oz (80.3 kg)  08/24/24 184 lb 9.6 oz (83.7 kg)  07/16/24 183 lb 2 oz (83.1 kg)     Physical Exam Vitals reviewed.  HENT:     Head: Normocephalic and atraumatic.  Eyes:     Pupils: Pupils are equal, round, and reactive to light.  Cardiovascular:     Rate and Rhythm: Normal rate and regular rhythm.     Heart sounds: Normal heart sounds.  Pulmonary:     Effort: Pulmonary effort is normal.     Breath sounds: Normal breath sounds.  Abdominal:     General: Bowel sounds are normal.     Palpations: Abdomen is soft.  Musculoskeletal:        General: No tenderness or deformity. Normal range of motion.     Cervical back: Normal range of motion.     Comments: Behind the left knee  is a surgical scar.  This is well-healed.  She has significant radiation dermatitis.  There is couple areas of open sores.  There is no obvious discharge.  She has a little bit of swelling.    Lymphadenopathy:     Cervical: No cervical adenopathy.  Skin:    General: Skin is warm and dry.     Findings: No erythema or rash.  Neurological:     Mental Status: She is alert and oriented to person, place, and time.  Psychiatric:        Behavior: Behavior normal.        Thought Content: Thought content normal.        Judgment: Judgment normal.      Lab Results  Component Value Date   WBC 5.8 09/08/2024   HGB 11.1 (L) 09/08/2024   HCT 34.9 (L) 09/08/2024   MCV 93.6 09/08/2024   PLT 264 09/08/2024     Chemistry      Component Value Date/Time   NA 135 09/08/2024 1031   NA 136 06/30/2024 1515   K 4.7 09/08/2024 1031   CL 98 09/08/2024 1031   CO2 26 09/08/2024 1031   BUN 22 09/08/2024 1031   BUN 37 (H) 06/30/2024 1515   CREATININE 1.41 (H) 09/08/2024 1031   CREATININE 0.90 10/24/2016 1127      Component Value Date/Time   CALCIUM 9.3 09/08/2024 1031   ALKPHOS 80 09/08/2024 1031   AST 14 (L) 09/08/2024 1031   ALT 8 09/08/2024 1031   BILITOT 0.3 09/08/2024 1031       Impression and Plan: Ms. Coste is a very charming 84 year old white female.  She has a sarcoma behind the left  knee in the popliteal fossa.  This is a fairly high-grade sarcoma.  As such, she had this resected.  Her margins were positive.  We have given her radiotherapy.  She completed 6840 rad of radiotherapy on 04/30/2023.  I feel that she had their issue with this abdominal pain and diverticulitis.  Hopefully, this will continue to improve.  We will go ahead and see about the PET scan.  Will try to get this set up in a couple weeks.  I will plan to see her back probably in February.  By then, she would have seen Dr. Tanda over at [facility].   Maude JONELLE Crease, MD 1/7/202611:27 AM

## 2024-09-13 ENCOUNTER — Ambulatory Visit: Admitting: Gastroenterology

## 2024-09-15 ENCOUNTER — Telehealth: Payer: Self-pay | Admitting: Dietician

## 2024-09-15 NOTE — Telephone Encounter (Signed)
 Patient screened on MST. First attempt to reach. Provided my cell# on voice mail to return call to set up a nutrition consult.  Micheline Craven, RDN, LDN Registered Dietitian, [facility] Part Time Remote (Usual office hours: Tuesday-Thursday) Cell: [phone]

## 2024-09-22 ENCOUNTER — Other Ambulatory Visit: Payer: Self-pay | Admitting: Family Medicine

## 2024-09-22 ENCOUNTER — Other Ambulatory Visit: Payer: Self-pay | Admitting: Cardiology

## 2024-09-22 DIAGNOSIS — H8110 Benign paroxysmal vertigo, unspecified ear: Secondary | ICD-10-CM

## 2024-09-23 NOTE — CV Procedure (Signed)
" °  Device system confirmed to be MRI conditional, with implant date > 6 weeks ago, and no evidence of abandoned or epicardial leads in review of most recent CXR  Device last cleared by EP Provider: Daphne Barrack 09/23/2024 (cleared 06/2024)  Clearance is good through for 1 year as long as parameters remain stable at time of check. If pt undergoes a cardiac device procedure during that time, they should be re-cleared.   Tachy-therapies to be programmed off if applicable with device back to pre-MRI settings after completion of exam.  [employer] - Programming recommendation received through [employer] App/Tablet  Rocky Catalan, RT  09/23/2024 4:09 PM     "

## 2024-09-24 ENCOUNTER — Encounter (HOSPITAL_COMMUNITY)

## 2024-09-24 NOTE — Telephone Encounter (Signed)
 Medication was discontinue by Anner Alm ORN, MD

## 2024-09-28 ENCOUNTER — Ambulatory Visit (HOSPITAL_COMMUNITY)
Admission: RE | Admit: 2024-09-28 | Discharge: 2024-09-28 | Disposition: A | Discharge status: Home / Self Care | Source: Ambulatory Visit | Attending: Family Medicine | Admitting: Family Medicine

## 2024-09-28 DIAGNOSIS — M549 Dorsalgia, unspecified: Secondary | ICD-10-CM | POA: Insufficient documentation

## 2024-09-28 DIAGNOSIS — M5134 Other intervertebral disc degeneration, thoracic region: Secondary | ICD-10-CM

## 2024-09-28 DIAGNOSIS — M47814 Spondylosis without myelopathy or radiculopathy, thoracic region: Secondary | ICD-10-CM | POA: Diagnosis not present

## 2024-09-28 NOTE — Progress Notes (Signed)
 Patient was monitored by this RN during MRI scan due to presence of a pacemaker. Cardiac rhythm was continuously monitored throughout the procedure. Prior to the start of the scan, the pacemaker was placed in MRI-safe mode by the MRI technician and/or pacemaker representative.Patient was programmed to DOO 100. Following the completion of the scan, the device was returned to its pre-MRI settings. Neurological status and orientation post-procedure were unchanged from baseline.   Pre-procedure Heart Rate (Prior to being placed in MRI safe mode): 82 Post-procedure Heart Rate (Once pacemaker is returned to baseline mode): 69

## 2024-10-08 ENCOUNTER — Encounter

## 2024-10-11 ENCOUNTER — Encounter (HOSPITAL_COMMUNITY): Admission: RE | Admit: 2024-10-11

## 2024-10-12 ENCOUNTER — Ambulatory Visit: Admitting: Gastroenterology

## 2024-10-13 ENCOUNTER — Encounter

## 2024-10-20 ENCOUNTER — Inpatient Hospital Stay: Admitting: Hematology & Oncology

## 2024-10-20 ENCOUNTER — Inpatient Hospital Stay

## 2024-10-21 ENCOUNTER — Inpatient Hospital Stay

## 2024-10-21 ENCOUNTER — Inpatient Hospital Stay: Admitting: Hematology & Oncology

## 2024-11-02 ENCOUNTER — Ambulatory Visit: Admitting: Cardiology

## 2024-11-03 ENCOUNTER — Ambulatory Visit: Admitting: Gastroenterology

## 2025-01-07 ENCOUNTER — Encounter

## 2025-01-12 ENCOUNTER — Encounter

## 2025-02-07 ENCOUNTER — Ambulatory Visit: Admitting: Cardiovascular Disease

## 2025-04-08 ENCOUNTER — Encounter

## 2025-04-13 ENCOUNTER — Encounter

## 2025-05-31 ENCOUNTER — Ambulatory Visit

## 2025-07-08 ENCOUNTER — Encounter

## 2025-07-13 ENCOUNTER — Encounter
